# Patient Record
Sex: Female | Born: 1960
Health system: Southern US, Community
[De-identification: ages and names within clinical notes are randomized; demographics above are authoritative.]

## PROBLEM LIST (undated history)

## (undated) DIAGNOSIS — K5900 Constipation, unspecified: Secondary | ICD-10-CM

## (undated) DIAGNOSIS — D509 Iron deficiency anemia, unspecified: Secondary | ICD-10-CM

## (undated) DIAGNOSIS — G4733 Obstructive sleep apnea (adult) (pediatric): Secondary | ICD-10-CM

## (undated) DIAGNOSIS — I10 Essential (primary) hypertension: Secondary | ICD-10-CM

## (undated) DIAGNOSIS — K829 Disease of gallbladder, unspecified: Secondary | ICD-10-CM

## (undated) DIAGNOSIS — G4730 Sleep apnea, unspecified: Secondary | ICD-10-CM

## (undated) DIAGNOSIS — F329 Major depressive disorder, single episode, unspecified: Secondary | ICD-10-CM

## (undated) DIAGNOSIS — R110 Nausea: Secondary | ICD-10-CM

## (undated) DIAGNOSIS — M1990 Unspecified osteoarthritis, unspecified site: Secondary | ICD-10-CM

## (undated) DIAGNOSIS — Z8489 Family history of other specified conditions: Secondary | ICD-10-CM

## (undated) DIAGNOSIS — K589 Irritable bowel syndrome without diarrhea: Secondary | ICD-10-CM

## (undated) DIAGNOSIS — Z9989 Dependence on other enabling machines and devices: Secondary | ICD-10-CM

## (undated) DIAGNOSIS — J309 Allergic rhinitis, unspecified: Secondary | ICD-10-CM

## (undated) DIAGNOSIS — H6990 Unspecified Eustachian tube disorder, unspecified ear: Secondary | ICD-10-CM

## (undated) DIAGNOSIS — R079 Chest pain, unspecified: Secondary | ICD-10-CM

## (undated) DIAGNOSIS — E785 Hyperlipidemia, unspecified: Secondary | ICD-10-CM

## (undated) DIAGNOSIS — E669 Obesity, unspecified: Secondary | ICD-10-CM

## (undated) DIAGNOSIS — K602 Anal fissure, unspecified: Secondary | ICD-10-CM

## (undated) DIAGNOSIS — K649 Unspecified hemorrhoids: Secondary | ICD-10-CM

## (undated) DIAGNOSIS — M2550 Pain in unspecified joint: Secondary | ICD-10-CM

## (undated) DIAGNOSIS — R519 Headache, unspecified: Secondary | ICD-10-CM

## (undated) DIAGNOSIS — M797 Fibromyalgia: Secondary | ICD-10-CM

## (undated) DIAGNOSIS — G4700 Insomnia, unspecified: Secondary | ICD-10-CM

## (undated) DIAGNOSIS — K219 Gastro-esophageal reflux disease without esophagitis: Secondary | ICD-10-CM

## (undated) DIAGNOSIS — S0300XA Dislocation of jaw, unspecified side, initial encounter: Secondary | ICD-10-CM

## (undated) DIAGNOSIS — H6980 Other specified disorders of Eustachian tube, unspecified ear: Secondary | ICD-10-CM

## (undated) DIAGNOSIS — D649 Anemia, unspecified: Secondary | ICD-10-CM

## (undated) DIAGNOSIS — IMO0002 Reserved for concepts with insufficient information to code with codable children: Secondary | ICD-10-CM

## (undated) DIAGNOSIS — M549 Dorsalgia, unspecified: Secondary | ICD-10-CM

## (undated) DIAGNOSIS — G5600 Carpal tunnel syndrome, unspecified upper limb: Secondary | ICD-10-CM

## (undated) DIAGNOSIS — F32A Depression, unspecified: Secondary | ICD-10-CM

## (undated) DIAGNOSIS — K760 Fatty (change of) liver, not elsewhere classified: Secondary | ICD-10-CM

## (undated) DIAGNOSIS — J45909 Unspecified asthma, uncomplicated: Secondary | ICD-10-CM

## (undated) DIAGNOSIS — Z8669 Personal history of other diseases of the nervous system and sense organs: Secondary | ICD-10-CM

## (undated) DIAGNOSIS — N816 Rectocele: Secondary | ICD-10-CM

## (undated) HISTORY — DX: Unspecified asthma, uncomplicated: J45.909

## (undated) HISTORY — DX: Carpal tunnel syndrome, unspecified upper limb: G56.00

## (undated) HISTORY — DX: Major depressive disorder, single episode, unspecified: F32.9

## (undated) HISTORY — DX: Dislocation of jaw, unspecified side, initial encounter: S03.00XA

## (undated) HISTORY — DX: Unspecified osteoarthritis, unspecified site: M19.90

## (undated) HISTORY — DX: Essential (primary) hypertension: I10

## (undated) HISTORY — DX: Pain in unspecified joint: M25.50

## (undated) HISTORY — DX: Anal fissure, unspecified: K60.2

## (undated) HISTORY — DX: Fatty (change of) liver, not elsewhere classified: K76.0

## (undated) HISTORY — DX: Insomnia, unspecified: G47.00

## (undated) HISTORY — DX: Other specified disorders of Eustachian tube, unspecified ear: H69.80

## (undated) HISTORY — DX: Allergic rhinitis, unspecified: J30.9

## (undated) HISTORY — DX: Dorsalgia, unspecified: M54.9

## (undated) HISTORY — DX: Obesity, unspecified: E66.9

## (undated) HISTORY — DX: Depression, unspecified: F32.A

## (undated) HISTORY — DX: Irritable bowel syndrome, unspecified: K58.9

## (undated) HISTORY — DX: Nausea: R11.0

## (undated) HISTORY — DX: Hyperlipidemia, unspecified: E78.5

## (undated) HISTORY — DX: Unspecified eustachian tube disorder, unspecified ear: H69.90

## (undated) HISTORY — DX: Unspecified hemorrhoids: K64.9

## (undated) HISTORY — PX: CHOLECYSTECTOMY: SHX55

## (undated) HISTORY — DX: Sleep apnea, unspecified: G47.30

## (undated) HISTORY — PX: DILATION AND CURETTAGE OF UTERUS: SHX78

## (undated) HISTORY — DX: Disease of gallbladder, unspecified: K82.9

## (undated) HISTORY — DX: Chest pain, unspecified: R07.9

## (undated) HISTORY — DX: Dependence on other enabling machines and devices: Z99.89

## (undated) HISTORY — DX: Iron deficiency anemia, unspecified: D50.9

## (undated) HISTORY — DX: Gastro-esophageal reflux disease without esophagitis: K21.9

## (undated) HISTORY — DX: Reserved for concepts with insufficient information to code with codable children: IMO0002

## (undated) HISTORY — DX: Constipation, unspecified: K59.00

## (undated) HISTORY — DX: Personal history of other diseases of the nervous system and sense organs: Z86.69

## (undated) HISTORY — DX: Obstructive sleep apnea (adult) (pediatric): G47.33

## (undated) HISTORY — DX: Anemia, unspecified: D64.9

## (undated) HISTORY — DX: Rectocele: N81.6

---

## 1986-11-11 HISTORY — PX: TUBAL LIGATION: SHX77

## 1987-11-12 HISTORY — PX: CARPAL TUNNEL RELEASE: SHX101

## 1991-11-12 HISTORY — PX: ECTOPIC PREGNANCY SURGERY: SHX613

## 1999-04-17 ENCOUNTER — Encounter: Admission: RE | Admit: 1999-04-17 | Discharge: 1999-04-17 | Payer: Self-pay | Admitting: Sports Medicine

## 1999-04-17 ENCOUNTER — Other Ambulatory Visit: Admission: RE | Admit: 1999-04-17 | Discharge: 1999-04-17 | Payer: Self-pay | Admitting: *Deleted

## 1999-05-24 ENCOUNTER — Encounter: Admission: RE | Admit: 1999-05-24 | Discharge: 1999-05-24 | Payer: Self-pay | Admitting: Family Medicine

## 1999-09-30 ENCOUNTER — Emergency Department (HOSPITAL_COMMUNITY): Admission: EM | Admit: 1999-09-30 | Discharge: 1999-09-30 | Payer: Self-pay | Admitting: Emergency Medicine

## 1999-11-07 ENCOUNTER — Ambulatory Visit (HOSPITAL_COMMUNITY): Admission: RE | Admit: 1999-11-07 | Discharge: 1999-11-07 | Payer: Self-pay | Admitting: Orthopedic Surgery

## 1999-11-07 ENCOUNTER — Encounter: Payer: Self-pay | Admitting: Orthopedic Surgery

## 1999-11-13 ENCOUNTER — Encounter: Admission: RE | Admit: 1999-11-13 | Discharge: 1999-11-13 | Payer: Self-pay | Admitting: Family Medicine

## 1999-12-04 ENCOUNTER — Encounter: Admission: RE | Admit: 1999-12-04 | Discharge: 1999-12-04 | Payer: Self-pay | Admitting: Sports Medicine

## 2000-01-10 ENCOUNTER — Encounter: Admission: RE | Admit: 2000-01-10 | Discharge: 2000-01-10 | Payer: Self-pay | Admitting: Family Medicine

## 2000-01-21 ENCOUNTER — Emergency Department (HOSPITAL_COMMUNITY): Admission: EM | Admit: 2000-01-21 | Discharge: 2000-01-21 | Payer: Self-pay | Admitting: Emergency Medicine

## 2000-02-13 ENCOUNTER — Encounter: Admission: RE | Admit: 2000-02-13 | Discharge: 2000-02-13 | Payer: Self-pay | Admitting: Family Medicine

## 2000-03-10 ENCOUNTER — Encounter: Admission: RE | Admit: 2000-03-10 | Discharge: 2000-03-10 | Payer: Self-pay | Admitting: Family Medicine

## 2000-03-10 ENCOUNTER — Other Ambulatory Visit: Admission: RE | Admit: 2000-03-10 | Discharge: 2000-03-31 | Payer: Self-pay | Admitting: *Deleted

## 2000-04-03 ENCOUNTER — Encounter: Admission: RE | Admit: 2000-04-03 | Discharge: 2000-04-03 | Payer: Self-pay | Admitting: Family Medicine

## 2000-10-15 ENCOUNTER — Encounter: Admission: RE | Admit: 2000-10-15 | Discharge: 2000-10-15 | Payer: Self-pay | Admitting: Family Medicine

## 2001-02-26 ENCOUNTER — Encounter: Admission: RE | Admit: 2001-02-26 | Discharge: 2001-02-26 | Payer: Self-pay | Admitting: Family Medicine

## 2001-03-23 ENCOUNTER — Encounter: Admission: RE | Admit: 2001-03-23 | Discharge: 2001-03-23 | Payer: Self-pay | Admitting: Family Medicine

## 2001-03-31 ENCOUNTER — Encounter: Admission: RE | Admit: 2001-03-31 | Discharge: 2001-03-31 | Payer: Self-pay | Admitting: Family Medicine

## 2001-05-06 ENCOUNTER — Encounter: Admission: RE | Admit: 2001-05-06 | Discharge: 2001-05-06 | Payer: Self-pay | Admitting: Family Medicine

## 2001-07-29 ENCOUNTER — Encounter: Admission: RE | Admit: 2001-07-29 | Discharge: 2001-07-29 | Payer: Self-pay | Admitting: Family Medicine

## 2001-09-11 ENCOUNTER — Encounter: Admission: RE | Admit: 2001-09-11 | Discharge: 2001-09-11 | Payer: Self-pay | Admitting: Family Medicine

## 2001-10-14 ENCOUNTER — Emergency Department (HOSPITAL_COMMUNITY): Admission: EM | Admit: 2001-10-14 | Discharge: 2001-10-14 | Payer: Self-pay | Admitting: Emergency Medicine

## 2002-01-08 ENCOUNTER — Encounter: Admission: RE | Admit: 2002-01-08 | Discharge: 2002-01-08 | Payer: Self-pay | Admitting: Family Medicine

## 2002-02-05 ENCOUNTER — Encounter: Admission: RE | Admit: 2002-02-05 | Discharge: 2002-02-05 | Payer: Self-pay | Admitting: Sports Medicine

## 2002-04-21 ENCOUNTER — Encounter: Admission: RE | Admit: 2002-04-21 | Discharge: 2002-04-21 | Payer: Self-pay | Admitting: Family Medicine

## 2002-07-23 ENCOUNTER — Encounter: Admission: RE | Admit: 2002-07-23 | Discharge: 2002-07-23 | Payer: Self-pay | Admitting: Family Medicine

## 2002-07-26 ENCOUNTER — Encounter: Admission: RE | Admit: 2002-07-26 | Discharge: 2002-07-26 | Payer: Self-pay | Admitting: Family Medicine

## 2002-12-02 ENCOUNTER — Encounter: Admission: RE | Admit: 2002-12-02 | Discharge: 2002-12-02 | Payer: Self-pay | Admitting: Family Medicine

## 2003-03-04 ENCOUNTER — Encounter: Admission: RE | Admit: 2003-03-04 | Discharge: 2003-03-04 | Payer: Self-pay | Admitting: Family Medicine

## 2003-03-10 ENCOUNTER — Encounter: Admission: RE | Admit: 2003-03-10 | Discharge: 2003-03-10 | Payer: Self-pay | Admitting: Family Medicine

## 2003-03-24 ENCOUNTER — Encounter: Admission: RE | Admit: 2003-03-24 | Discharge: 2003-03-24 | Payer: Self-pay | Admitting: Sports Medicine

## 2003-03-24 ENCOUNTER — Encounter: Payer: Self-pay | Admitting: Sports Medicine

## 2003-03-24 ENCOUNTER — Encounter: Admission: RE | Admit: 2003-03-24 | Discharge: 2003-03-24 | Payer: Self-pay | Admitting: Family Medicine

## 2003-03-28 ENCOUNTER — Encounter: Admission: RE | Admit: 2003-03-28 | Discharge: 2003-03-28 | Payer: Self-pay | Admitting: Sports Medicine

## 2003-03-28 ENCOUNTER — Encounter: Payer: Self-pay | Admitting: Sports Medicine

## 2003-09-05 ENCOUNTER — Encounter: Admission: RE | Admit: 2003-09-05 | Discharge: 2003-09-05 | Payer: Self-pay | Admitting: Family Medicine

## 2003-12-02 ENCOUNTER — Encounter: Admission: RE | Admit: 2003-12-02 | Discharge: 2003-12-02 | Payer: Self-pay | Admitting: Family Medicine

## 2003-12-25 ENCOUNTER — Emergency Department (HOSPITAL_COMMUNITY): Admission: EM | Admit: 2003-12-25 | Discharge: 2003-12-26 | Payer: Self-pay | Admitting: Emergency Medicine

## 2003-12-27 ENCOUNTER — Encounter: Admission: RE | Admit: 2003-12-27 | Discharge: 2003-12-27 | Payer: Self-pay | Admitting: Sports Medicine

## 2004-02-06 ENCOUNTER — Encounter: Admission: RE | Admit: 2004-02-06 | Discharge: 2004-02-06 | Payer: Self-pay | Admitting: Family Medicine

## 2004-02-10 ENCOUNTER — Encounter (INDEPENDENT_AMBULATORY_CARE_PROVIDER_SITE_OTHER): Payer: Self-pay | Admitting: *Deleted

## 2004-02-10 LAB — CONVERTED CEMR LAB

## 2004-03-09 ENCOUNTER — Encounter: Admission: RE | Admit: 2004-03-09 | Discharge: 2004-03-09 | Payer: Self-pay | Admitting: Sports Medicine

## 2004-03-09 ENCOUNTER — Encounter (INDEPENDENT_AMBULATORY_CARE_PROVIDER_SITE_OTHER): Payer: Self-pay | Admitting: *Deleted

## 2004-03-15 ENCOUNTER — Encounter: Admission: RE | Admit: 2004-03-15 | Discharge: 2004-03-15 | Payer: Self-pay | Admitting: Family Medicine

## 2005-04-19 ENCOUNTER — Encounter: Admission: RE | Admit: 2005-04-19 | Discharge: 2005-04-19 | Payer: Self-pay | Admitting: Family Medicine

## 2006-06-26 ENCOUNTER — Encounter: Admission: RE | Admit: 2006-06-26 | Discharge: 2006-06-26 | Payer: Self-pay | Admitting: Family Medicine

## 2006-11-11 LAB — HM COLONOSCOPY

## 2007-01-08 DIAGNOSIS — E739 Lactose intolerance, unspecified: Secondary | ICD-10-CM | POA: Insufficient documentation

## 2007-01-08 DIAGNOSIS — K5900 Constipation, unspecified: Secondary | ICD-10-CM | POA: Insufficient documentation

## 2007-01-08 DIAGNOSIS — D259 Leiomyoma of uterus, unspecified: Secondary | ICD-10-CM | POA: Insufficient documentation

## 2007-01-09 ENCOUNTER — Encounter (INDEPENDENT_AMBULATORY_CARE_PROVIDER_SITE_OTHER): Payer: Self-pay | Admitting: *Deleted

## 2007-02-25 ENCOUNTER — Emergency Department (HOSPITAL_COMMUNITY): Admission: EM | Admit: 2007-02-25 | Discharge: 2007-02-25 | Payer: Self-pay | Admitting: Family Medicine

## 2007-04-20 ENCOUNTER — Ambulatory Visit: Payer: Self-pay | Admitting: Internal Medicine

## 2007-05-04 ENCOUNTER — Encounter (INDEPENDENT_AMBULATORY_CARE_PROVIDER_SITE_OTHER): Payer: Self-pay | Admitting: *Deleted

## 2007-05-04 ENCOUNTER — Ambulatory Visit: Payer: Self-pay | Admitting: Internal Medicine

## 2007-07-30 ENCOUNTER — Encounter: Admission: RE | Admit: 2007-07-30 | Discharge: 2007-07-30 | Payer: Self-pay

## 2008-01-11 ENCOUNTER — Emergency Department (HOSPITAL_COMMUNITY): Admission: EM | Admit: 2008-01-11 | Discharge: 2008-01-11 | Payer: Self-pay | Admitting: Family Medicine

## 2008-07-28 ENCOUNTER — Emergency Department (HOSPITAL_COMMUNITY): Admission: EM | Admit: 2008-07-28 | Discharge: 2008-07-28 | Payer: Self-pay | Admitting: Emergency Medicine

## 2008-08-02 ENCOUNTER — Encounter: Admission: RE | Admit: 2008-08-02 | Discharge: 2008-08-02 | Payer: Self-pay | Admitting: Family Medicine

## 2008-09-01 ENCOUNTER — Ambulatory Visit: Payer: Self-pay | Admitting: Family Medicine

## 2008-11-29 ENCOUNTER — Encounter: Admission: RE | Admit: 2008-11-29 | Discharge: 2009-01-31 | Payer: Self-pay | Admitting: Family Medicine

## 2008-12-20 ENCOUNTER — Ambulatory Visit (HOSPITAL_COMMUNITY): Admission: RE | Admit: 2008-12-20 | Discharge: 2008-12-20 | Payer: Self-pay | Admitting: Neurology

## 2009-09-05 ENCOUNTER — Encounter: Admission: RE | Admit: 2009-09-05 | Discharge: 2009-09-05 | Payer: Self-pay | Admitting: Family Medicine

## 2010-05-29 ENCOUNTER — Emergency Department (HOSPITAL_COMMUNITY): Admission: EM | Admit: 2010-05-29 | Discharge: 2010-05-29 | Payer: Self-pay | Admitting: Emergency Medicine

## 2010-06-07 ENCOUNTER — Ambulatory Visit (HOSPITAL_COMMUNITY): Admission: RE | Admit: 2010-06-07 | Discharge: 2010-06-08 | Payer: Self-pay | Admitting: Obstetrics and Gynecology

## 2010-06-07 ENCOUNTER — Encounter (INDEPENDENT_AMBULATORY_CARE_PROVIDER_SITE_OTHER): Payer: Self-pay | Admitting: Obstetrics and Gynecology

## 2010-06-07 ENCOUNTER — Encounter (INDEPENDENT_AMBULATORY_CARE_PROVIDER_SITE_OTHER): Payer: Self-pay | Admitting: *Deleted

## 2010-06-08 ENCOUNTER — Encounter (INDEPENDENT_AMBULATORY_CARE_PROVIDER_SITE_OTHER): Payer: Self-pay | Admitting: *Deleted

## 2010-09-26 ENCOUNTER — Encounter: Admission: RE | Admit: 2010-09-26 | Discharge: 2010-09-26 | Payer: Self-pay | Admitting: Family Medicine

## 2010-11-11 HISTORY — PX: ABDOMINAL HYSTERECTOMY: SHX81

## 2010-11-19 ENCOUNTER — Encounter (INDEPENDENT_AMBULATORY_CARE_PROVIDER_SITE_OTHER): Payer: Self-pay | Admitting: *Deleted

## 2010-11-21 ENCOUNTER — Encounter
Admission: RE | Admit: 2010-11-21 | Discharge: 2010-12-11 | Payer: Self-pay | Source: Home / Self Care | Attending: Family Medicine | Admitting: Family Medicine

## 2010-12-02 ENCOUNTER — Encounter: Payer: Self-pay | Admitting: Family Medicine

## 2010-12-13 NOTE — Letter (Signed)
Summary: New Patient letter  [facility]  [address]   [place], [place] [address]   Phone: [phone]  Fax: [phone]       11/19/2010 MRN: [id number]  Cristina [name] [address] [place], [place]  [address]  Dear Cristina [name],  Welcome to the Gastroenterology Division at [facility].    You are scheduled to see Dr.  Marina Goodell on 12/31/2010 at 10:00 on the 3rd floor at [facility], [address]  We ask that you try to arrive at our office 15 minutes prior to your appointment time to allow for check-in.  We would like you to complete the enclosed self-administered evaluation form prior to your visit and bring it with you on the day of your appointment.  We will review it with you.  Also, please bring a complete list of all your medications or, if you prefer, bring the medication bottles and we will list them.  Please bring your insurance card so that we may make a copy of it.  If your insurance requires a referral to see a specialist, please bring your referral form from your primary care physician.  Co-payments are due at the time of your visit and may be paid by cash, check or credit card.     Your office visit will consist of a consult with your physician (includes a physical exam), any laboratory testing he/she may order, scheduling of any necessary diagnostic testing (e.g. x-ray, ultrasound, CT-scan), and scheduling of a procedure (e.g. Endoscopy, Colonoscopy) if required.  Please allow enough time on your schedule to allow for any/all of these possibilities.    If you cannot keep your appointment, please call [phone] to cancel or reschedule prior to your appointment date.  This allows [name] the opportunity to schedule an appointment for another patient in need of care.  If you do not cancel or reschedule by 5 p.m. the business day prior to your appointment date, you will be charged a $50.00 late cancellation/no-show fee.    Thank you for choosing  [facility] for your medical needs.  We appreciate the opportunity to care for you.  Please visit [name] at our website  to learn more about our practice.                     Sincerely,                                                             The Gastroenterology Division

## 2010-12-18 ENCOUNTER — Ambulatory Visit: Payer: Self-pay | Admitting: Family Medicine

## 2010-12-31 ENCOUNTER — Ambulatory Visit (INDEPENDENT_AMBULATORY_CARE_PROVIDER_SITE_OTHER): Payer: Commercial Managed Care - PPO | Admitting: Internal Medicine

## 2010-12-31 ENCOUNTER — Encounter: Payer: Self-pay | Admitting: Internal Medicine

## 2010-12-31 DIAGNOSIS — K6289 Other specified diseases of anus and rectum: Secondary | ICD-10-CM | POA: Insufficient documentation

## 2010-12-31 DIAGNOSIS — E119 Type 2 diabetes mellitus without complications: Secondary | ICD-10-CM | POA: Insufficient documentation

## 2010-12-31 DIAGNOSIS — K602 Anal fissure, unspecified: Secondary | ICD-10-CM | POA: Insufficient documentation

## 2010-12-31 DIAGNOSIS — R1310 Dysphagia, unspecified: Secondary | ICD-10-CM | POA: Insufficient documentation

## 2011-01-02 NOTE — Op Note (Signed)
Summary: Operative Report      NAME:  Cristina [name], Cristina [name]                 ACCOUNT NO.:  [id number]      MEDICAL RECORD NO.:  [id number]          PATIENT TYPE:  OIB      LOCATION:  9311                          FACILITY:  [facility]      PHYSICIAN:  Huel Cote, M.D. DATE OF BIRTH:  [birth year]      DATE OF PROCEDURE:  06/07/2010   DATE OF DISCHARGE:                                  OPERATIVE REPORT      PREOPERATIVE DIAGNOSES:   1. Menorrhagia.   2. Fibroids.   3. Cystocele.   4. Rectocele.      POSTOPERATIVE DIAGNOSES:   1. Menorrhagia.   2. Fibroids.   3. Cystocele.   4. Rectocele.      PROCEDURE:  Laparoscopic-assisted vaginal hysterectomy and anterior-   posterior repair.      SURGEON:  Huel Cote, MD      ASSISTANT:  Zenaida Niece, MD      ANESTHESIA:  General.      FINDINGS:  The uterus is 10-12 weeks in size and fibroids.  Ovaries and   tubes were normal.  There were some mesenteric adhesions on the upper   abdominal wall.  All other anatomy appeared normal.      SPECIMENS:  Uterus and cervix were sent to Pathology.      ESTIMATED BLOOD LOSS:  250 mL.      URINE OUTPUT:  150 mL clear urine.      IV FLUIDS:  2200 mL LR.      There were no known complications.      PROCEDURE IN DETAIL:  The patient was taken to the operating room where   general anesthesia was obtained without difficulty.  She was then   prepped and draped in the normal sterile fashion in the dorsal lithotomy   position.  A speculum was placed in the vagina and a Hulka tenaculum   placed within the uterus for uterine manipulation.  The Foley catheter   was also placed and attention was then turned to the abdomen.   Infraumbilical incision was made through a preexisting scar with the   scalpel and the Veress needle introduced into this area.  It was noted   that the peritoneal cavity was quite deep and once the Veress needle was   introduced, the peritoneal placement was confirmed by  aspiration   injection with normal saline.  The pneumoperitoneum was then obtained   with approximately 3 liters of CO2 gas.  The pressures were noted to be   around 7 with filling.  Once this was obtained, the Veress needle was   removed and the Optiview trocar utilized, 5-mm in size and the   peritoneal cavity was entered under direct visualization.  The   preperitoneal fat was somewhat thick, but the trocar was able to be   introduced into the peritoneal cavity, however, it was difficult to seat   it very deeply into the cavity.  The pelvis and abdomen were inspected   with findings as  previously stated.  Two additional trocars were placed   under direct visualization in the right and left upper quadrant, 5-mm in   size, each trocar site was injected with 0.25% Marcaine prior to   insertion.  Once the three trocar sites were in place, the uterus was   grasped and the cul-de-sac appeared clear.  All appeared normal as far   as the pelvic anatomy.  Therefore, the atraumatic grasper was utilized   to grasp the cornua and the patient's right utero-ovarian ligament,   broad ligament, and round ligament were taken down sequentially with the   harmonic scalpel.  The bladder flap was developed with the harmonic   scalpel as well.  Once this side was completed in a similar fashion, the   cornua was reflected medially from the left and the left utero-ovarian   broad round ligament were taken down with the harmonic scalpel.  The   bladder flap was also developed on this side and a good dissection   performed.  There was no active bleeding noted at this point.  So, all   instruments and sponges were removed from the abdomen.  The camera and   light were turned off and the patient was repositioned with the legs up.   Attention was then turned vaginally where the Hulka tenaculum was   removed.  A weighted speculum was placed within the vagina and the   cervix grasped with 2 Jacobson tenaculums.  The  circumferential mucosa   around the cervix was injected with a dilute solution of Pitressin and   the Bovie cautery utilized to make a circumferential incision and the   mucosa overlying the cervix.  This was then additionally trimmed away   with Mayo scissors and the cul-de-sac posteriorly was entered sharply.   Once posterior cul-de-sac was entered, the banana speculum was placed   within the cul-de-sac and the uterosacral ligaments were then clamped   with a parametrial clamp transected and suture ligated with 2-0 Vicryl.   The anterior cul-de-sac was then also entered sharply and the Sims   retractor placed within that, thus with the uterus isolated from the   bladder and the rectum.  The remainder of the paracervical tissue was   taken down sequentially with Zeppelin clamps. At each stage, it was   transected and suture ligated with 0 Vicryl.  This was carried up to the   level of the upper cervix and at this point, attempts were made to flip   the uterus, although there was not much intervening tissue.  The uterus   could not be adequately flipped, therefore it was partially cord and   once this was performed, the uterus did move for enough to be flipped.   It was no longer attached on the right and on the left, there was a   small amount of tissue which was clamped with a Zeppelin clamp and the   entire uterus then amputated and handed off to Pathology.  There was an   area of bleeding around the uterine artery on the left and this was   secured with a suture ligature with no active bleeding noted.  The   sponge stick was then placed within the vaginal cuff and all areas were   carefully inspected.  Small amounts of bleeding on posterior cuff were   secured with a running locked suture of 2-0 Vicryl.  There was no other   significant active bleeding noted.  Therefore,  the peritoneum was closed   with 2-0 Vicryl and uterosacral ligaments reapproximated to one another   as well with 0  Vicryl.  Once this was performed, the anterior portion of   the cuff was grasped and the mucosa at the anterior cuff was undermined   with the Metzenbaum scissors along the midline.  This was dissected away   from the underlying pubovesical fascia up to approximately 2 cm below   the urethral meatus.  Once this was dissected, the mucosal flaps were   retracted laterally and the pubovesical fascia trimmed away and   dissected medially.  Once the dissection had been performed, the   pubovesical fascia was then reapproximated with several interrupted   sutures of 2-0 Vicryl in an interrupted fashion.  These were secured and   the overlying mucosa that was excess was trimmed away with Mayo   scissors.  The vaginal mucosa was then closed with 2-0 Vicryl in a   running locked fashion as well as the vaginal cuff was closed in a   running fashion with 2-0 Vicryl.  There was no active bleeding noted   after cuff closure and attention was then turned to the vaginal floor   which was grasped just inside the introitus with Allis clamps.  The   vaginal mucosa was injected with a dilute solution of Pitressin and then   trimmed away just at the introitus.  The vaginal mucosa was then   undermined with Metzenbaum scissors along the midline up to   approximately 1-2 cm below the vaginal cuff.  These were then reflected   laterally and the rectovaginal fascia was dissected off these flaps and   pushed back to the midline.  Thus, the rectocele was dissected away and   the excess mucosa retracted laterally.  The rectocele was then reduced   with several interrupted sutures of 2-0 Vicryl in an interrupted   fashion.  Rectal exam was then performed with an overlying glove and a   good reduction was noted.  The excess vaginal mucosa was then trimmed   away and closed with a running locked suture of 2-0 Vicryl.  All   appeared hemostatic.  The vagina then was packed with Estrace-coated   gauze and this portion  completed.  Gloves were then changed and   attention returned to the patient's abdomen where again pneumoperitoneum   was obtained.  The ports had slid out somewhat due to the patient's   abdominal wall size and these were gradually reintroduced without   difficulty under direct visualization.  Once these were in place again,   the abdomen and pelvis were carefully inspected.  There were no areas of   heavy active bleeding.  There were small areas of oozing along the   pelvic sidewall, the left and around the vaginal cuff.  These were   cauterized with harmonic scalpel and no significant active bleeding   noted at this point.  The ureters were not clearly visible due to the   patient's size, but appeared to be well away from the area of the   vaginal cuff closure.  At this point, all irrigant was removed.   Everything appeared hemostatic and the trocars were removed under direct   visualization and the pneumoperitoneum reduced.  Once this was completed   and the trocars were out, the skin sutures were closed.  There was 1   deep suture placed of 0 Vicryl at the umbilical port and these  had   particular stitch performed at the skin of all port sites of a 3-0   Vicryl.  Dermabond was used in addition.  Again sponge, lap, and needle   counts were correct x2 and the patient was taken to the recovery room in   good condition.               Huel Cote, M.D.            KR/MEDQ  D:  06/07/2010  T:  06/07/2010  Job:  [id number]      Electronically Signed by Huel Cote M.D. on 06/14/2010 09:12:33 AM

## 2011-01-02 NOTE — Discharge Summary (Signed)
Summary: Hospital Discharge Summary    NAME:  Cristina [name], Cristina [name]                 ACCOUNT NO.:  [id number]      MEDICAL RECORD NO.:  [id number]          PATIENT TYPE:  OIB      LOCATION:  9311                          FACILITY:  [facility]      PHYSICIAN:  Huel Cote, M.D. DATE OF BIRTH:  [birth year]      DATE OF ADMISSION:  06/07/2010   DATE OF DISCHARGE:  06/08/2010                                  DISCHARGE SUMMARY      DISCHARGE DIAGNOSES:   1. Menorrhagia.   2. Fibroid uterus.   3. Cystocele.   4. Rectocele.   5. Status post laparoscopic vaginal hysterectomy with anterior-       posterior repair.      DISCHARGE MEDICATIONS:  The patient will resume all of her preop   medications as she was already taking and in addition, has Vicodin 5/500   p.o. every 6 hours as needed for pain.      DISCHARGE FOLLOWUP:  The patient is to follow up in the office in 2-3   weeks for incision check.      HOSPITAL COURSE:  The patient is a 50 year old G4, P3-0-1-3, who came in   for a scheduled laparoscopic-assisted vaginal hysterectomy for   menorrhagia, fibroid uterus, and flooding.  The patient underwent her   surgery without incident and was admitted for routine postoperative   care.  On postop day #1, the patient was doing well.  She has a little   nausea and vomiting immediately following surgery, but this had   completely resolved and she was tolerating regular diet.  Her pain was   well controlled.  She had minimal vaginal bleeding and a vaginal packing   was removed.  She was able to void and ambulate without difficulty.  On   discharge, she was afebrile with stable vital signs.  Discharge   hemoglobin was 10.1.  She did have her blood sugars monitored which were   acceptable.  Her fasting blood sugar was little high on the day of   discharge of 120, but her postprandial the day before was 143.  She had   good urine output and her incisions were clear and abdomen was soft.   Vaginal packing  was removed prior to discharge and the discharge plan   was made for her to follow up in the office in 2-3 weeks.  She was given   her pain prescriptions and was instructed on pelvic rest.  She will also   continue to check her Accu-Chek blood sugars at least 2-3 times a week   and ensure that they are not rising any as they were previously very   well controlled.               Huel Cote, M.D.            KR/MEDQ  D:  06/08/2010  T:  06/08/2010  Job:  [id number]      Electronically Signed by Huel Cote M.D. on 06/14/2010 09:12:34 AM

## 2011-01-02 NOTE — Procedures (Signed)
Summary: Colonoscopy   Colonoscopy  Procedure date:  05/04/2007  Findings:      [facility]:   [facility].  Results: Normal.  Patient Name: Meeah, Totino MRN:  Procedure Procedures: Colonoscopy CPT: 81191.  Personnel: Endoscopist: Wilhemina Bonito. Marina Goodell, MD.  Referred By: Nilda Simmer, MD.  Exam Location: Exam performed in Outpatient Clinic. Outpatient  Patient Consent: Procedure, Alternatives, Risks and Benefits discussed, consent obtained, from patient. Consent was obtained by the RN.  Indications  Increased Risk Screening: For family history of colorectal neoplasia, in  parent age at onset: 62. Uncle , aunt  History  Current Medications: Patient is not currently taking Coumadin.  Pre-Exam Physical: Performed May 04, 2007. Cardio-pulmonary exam, Rectal exam, HEENT exam , Abdominal exam, Mental status exam WNL.  Comments: Pt. history reviewed/updated, physical exam performed prior to initiation of sedation?yes Exam Exam: Extent of exam reached: Cecum, extent intended: Cecum.  The cecum was identified by appendiceal orifice and IC valve. Patient position: on left side. Time to Cecum: 00:03:14. Time for Withdrawl: 00:08:45. Colon retroflexion performed. Images taken. ASA Classification: II. Tolerance: excellent.  Monitoring: Pulse and BP monitoring, Oximetry used. Supplemental O2 given.  Colon Prep Used Miralax for colon prep. Prep results: excellent.  Sedation Meds: Patient assessed and found to be appropriate for moderate (conscious) sedation. Fentanyl 100 mcg. given IV. Versed 10 mg. given IV.  Findings NORMAL EXAM: Cecum to Rectum.   Assessment Normal examination.  Comments: NO POLYPS SEEN Events  Unplanned Interventions: No intervention was required.  Unplanned Events: There were no complications. Plans Disposition: After procedure patient sent to recovery. After recovery patient sent home.  Scheduling/Referral: Colonoscopy, to Wilhemina Bonito. Marina Goodell,  MD, IN 5 YEARS,    This report was created from the original endoscopy report, which was reviewed and signed by the above listed endoscopist.   cc:  Nilda Simmer, MD      The Patient

## 2011-01-03 ENCOUNTER — Encounter: Payer: Commercial Managed Care - PPO | Attending: Family Medicine

## 2011-01-08 NOTE — Letter (Signed)
Summary: Diabetic Instructions  [facility]  [place], [place] [address]   Phone: [phone]  Fax: [phone]    Cristina [name] 26-Feb-1961 MRN: [id number]    X   ORAL DIABETIC MEDICATION INSTRUCTIONS  The day before your procedure:   Take your diabetic pill as you do normally  The day of your procedure:   Do not take your diabetic pill    We will check your blood sugar levels during the admission process and again in Recovery before discharging you home  ________________________________________________________________________  _  _   INSULIN (LONG ACTING) MEDICATION INSTRUCTIONS (Lantus, NPH, 70/30, Humulin, Novolin-N)   The day before your procedure:   Take  your regular evening dose    The day of your procedure:   Do not take your morning dose    _  _   INSULIN (SHORT ACTING) MEDICATION INSTRUCTIONS (Regular, Humulog, Novolog)   The day before your procedure:   Do not take your evening dose   The day of your procedure:   Do not take your morning dose   _  _   INSULIN PUMP MEDICATION INSTRUCTIONS  We will contact the physician managing your diabetic care for written dosage instructions for the day before your procedure and the day of your procedure.  Once we have received the instructions, we will contact you.

## 2011-01-08 NOTE — Assessment & Plan Note (Addendum)
Summary: DYSPHAGIA AND RECTAL PAIN...    History of Present Illness Visit Type: Initial Visit Primary GI MD: Scarlette Shorts MD Primary Provider: Reginia Forts, MD Chief Complaint: Dysphagia with solids and rectal pain constantly x 2 months History of Present Illness:    50 year old female with obesity, hyperlipidemia, diabetes mellitus, an anal fissure. Last seen in June of 2008 4 screening colonoscopy. First degree relative as well as aunt and uncle with colon cancer. The examination was normal. Followup in 5 years recommended. She presents now with chief complaint of dysphagia. Also complained of rectal pain. First, she reports a history of anal fissure. Currently describes pain and burning with defecation for approximately 2 months. No significant bleeding. No treatment initiated. Next, a one-year history of intermittent solid food dysphagia. No significant weight change. No palpable pain. She had been placed on a PPI without change in symptoms. Currently on Nexium 40 mg daily. Takes Glucophage for diabetes   GI Review of Systems    Reports acid reflux, bloating, and  dysphagia with solids.      Denies abdominal pain, belching, chest pain, dysphagia with liquids, heartburn, loss of appetite, nausea, vomiting, vomiting blood, weight loss, and  weight gain.      Reports diarrhea, hemorrhoids, irritable bowel syndrome, rectal bleeding, and  rectal pain.     Denies anal fissure, black tarry stools, change in bowel habit, constipation, diverticulosis, fecal incontinence, heme positive stool, jaundice, light color stool, and  liver problems.    Current Medications (verified): 1)  Glucophage 500 Mg Tabs (Metformin Hcl) .... 2  By Mouth Two Times A Day 2)  Valtrex 500 Mg Tabs (Valacyclovir Hcl) .[name].. 1 By Mouth Once Daily 3)  Multivitamins  Tabs (Multiple Vitamin) .[name].. 1 By Mouth Once Daily 4)  Vitamin C 500 Mg Tabs (Ascorbic Acid) .[name].. 1 By Mouth Once Daily 5)  Stool Softener 100 Mg Caps (Docusate  Sodium) .... 2 By Mouth Once Daily As Needed 6)  Ambien 10 Mg Tabs (Zolpidem Tartrate) .[name].. 1 By Mouth At Bedtime As Needed Sleep 7)  Tylenol Pm Extra Strength 500-25 Mg Tabs (Diphenhydramine-Apap (Sleep)) .... 2 As Needed Headache 8)  Ibuprofen 600 Mg Tabs (Ibuprofen) .[name].. 1 By Mouth As Needed 9)  Zoloft 25 Mg Tabs (Sertraline Hcl) .... Take 1 Tablet By Mouth Once Daily 10)  Simvastatin 40 Mg Tabs (Simvastatin) .... Take 1 Tablet By Mouth Once Daily  Allergies (verified): 1)  ! 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 Carpal Tunnel Release Hysterectomy(Partial)  Family History: Reviewed history from 01/08/2007 and no changes required. 4 brothers- good health, in [place] and [place], Aunt-colon CA in 04-Feb-2023, colostomy, F dead at 52 GSW, M alive 61, HTN, OA, No breast cancer in the family, PGM-colon CA, Uncle-colon CA in 18s  Social History: Reviewed history from 01/08/2007 and no changes required. hometown is [place]; has 3 girls ages 25/23/16; 1  lives in [place], lives with oldest daughter, youngest daughter, grandson; divorced; 2 grandkids 81 & 58month.  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  The patient denies allergy/sinus, anemia, anxiety-new, back pain, blood in urine, breast changes/lumps, change in vision, confusion, cough, coughing up blood, fainting, fatigue, fever,  headaches-new, hearing problems, heart murmur, heart rhythm changes, itching, menstrual pain, muscle pains/cramps, night sweats, nosebleeds, pregnancy symptoms, shortness of breath, skin rash, sleeping problems, sore throat, swelling of feet/legs, swollen lymph glands, thirst - excessive , urination - excessive , urination changes/pain, urine leakage, vision changes, and voice change.    Vital Signs:  Patient profile:   50 year old female Height:      61 inches Weight:      216.38 pounds BMI:     41.03 Pulse rate:   60 / minute Pulse rhythm:   regular BP sitting:   120 / 80  (left arm) Cuff size:   regular  Vitals Entered By: June McMurray [place] Deborra Medina) (December 31, 2010 10:00 AM)  Physical Exam  General:  Well developed, well nourished, no acute distress. Head:  Normocephalic and atraumatic. Eyes:  PERRLA, no icterus. Nose:  No deformity, discharge,  or lesions. Mouth:  No deformity or lesions, dentition normal. Neck:  Supple; no masses or thyromegaly. Lungs:  Clear throughout to auscultation. Heart:  Regular rate and rhythm; no murmurs, rubs,  or bruits. Abdomen:  Soft, nontender and nondistended. No masses, hepatosplenomegaly or hernias noted. Normal bowel sounds. Rectal:  ANTERIOR FISSURE - TENDER. HEME NEGATIVE STOOL Msk:  Symmetrical with no gross deformities. Normal posture. Pulses:  Normal pulses noted. Extremities:  No edema or deformities noted. Neurologic:  Alert and  oriented x4 Skin:  Intact without significant lesions or rashes. Psych:  Alert and cooperative. Normal mood and affect.   Impression & Recommendations:  Problem # 1:  ANAL FISSURE (ICD-565.0)  anterior anal fissure with associated rectal pain (569.42 )   #1. sitz baths  twice a day #2. Daily fiber supplementation  #3. Diltiazem 2% cream 5 times daily to the anal canal  #4. Surgical referral if medically refractory  Problem # 2:  DYSPHAGIA UNSPECIFIED (ICD-787.20)  intermittent solid food dysphasia.  rule out stricture   Plan : #1. upper endoscopy with possible esophageal dilation. the nature of the procedure as well as the risks, benefits, and alternatives were reviewed. she understood and agreed to proceed  #2. hold diabetic medications the day of the exam 2 avoid and wanted hypoglycemia  Problem # 3:  DM (ICD-250.00)  hold diabetic medications the day of the examination until resuming oral intake. We will monitor her blood sugar medially before and after the procedure  Other Orders: EGD SAV (EGD SAV)  Patient Instructions: 1)  EGD Dil [facility] 01/10/11 10:30 am arrive at 9:30 am on 4th floor 2)  Upper Endoscopy brochure given.  3)  Upper Endoscopy with Dilatation brochure given.  4)  Hold Diabetic meds morning of procedure. 5)  Sitz bath instructions given to you to read and do daily 6)  Fiber daily. 7)  Diltiazem Cream has been called into [facility] for you to pick up and use 5 times daily to anus.  8)  Copy sent to : Reginia Forts, MD 9)  The medication list was reviewed and reconciled.  All changed / newly prescribed medications were explained.  A complete medication list was provided to the patient / caregiver. Prescriptions: DILTIAZEM CREAM 2% apply to anus 5 times daily  #30 gram x 2   Entered by:   Randye Lobo NCMA   Authorized  by:   Irene Shipper MD   Signed by:   Randye Lobo NCMA on 12/31/2010   Method used:   Telephoned to ...       [facility] (retail)       [address]       [place], [place]  [address]       Ph: [phone]       Fax: [phone]   RxID:   [id number]

## 2011-01-08 NOTE — Letter (Signed)
Summary: EGD Instructions  [facility]  [address] [place], [place] [address]   Phone: [phone]  Fax: [phone]       Laurene Witts    October 18, 1961    MRN: [id number]       Procedure Day /Date:THURSDAY, 01/10/11     Arrival Time:9:30 AM     Procedure Time:10:30 AM     Location of Procedure:                    [facility] (4th Floor)   PREPARATION FOR ENDOSCOPY/DIL   On THURSDAY, 01/10/11 THE DAY OF THE PROCEDURE:  1.   No solid foods, milk or milk products are allowed after midnight the night before your procedure.  2.   Do not drink anything colored red or purple.  Avoid juices with pulp.  No orange juice.  3.  You may drink clear liquids until 8:30 AM  which is 2 hours before your procedure.                                                                                                CLEAR LIQUIDS INCLUDE: Water Jello Ice Popsicles Tea (sugar ok, no milk/cream) Powdered fruit flavored drinks Coffee (sugar ok, no milk/cream) Gatorade Juice: apple, white grape, white cranberry  Lemonade Clear bullion, consomm, broth Carbonated beverages (any kind) Strained chicken noodle soup Hard Candy   MEDICATION INSTRUCTIONS  Unless otherwise instructed, you should take regular prescription medications with a small sip of water as early as possible the morning of your procedure.  Diabetic patients - see separate instructions.           OTHER INSTRUCTIONS  You will need a responsible adult at least 48 years of age to accompany you and drive you home.   This person must remain in the waiting room during your procedure.  Wear loose fitting clothing that is easily removed.  Leave jewelry and other valuables at home.  However, you may wish to bring a book to read or an iPod/MP3 player to listen to music as you wait for your procedure to start.  Remove all body piercing jewelry and leave at home.  Total time from sign-in until discharge  is approximately 2-3 hours.  You should go home directly after your procedure and rest.  You can resume normal activities the day after your procedure.  The day of your procedure you should not:   Drive   Make legal decisions   Operate machinery   Drink alcohol   Return to work  You will receive specific instructions about eating, activities and medications before you leave.    The above instructions have been reviewed and explained to me by   _______________________    I fully understand and can verbalize these instructions _____________________________ Date _________

## 2011-01-10 ENCOUNTER — Encounter (AMBULATORY_SURGERY_CENTER): Payer: Commercial Managed Care - PPO | Admitting: Internal Medicine

## 2011-01-10 ENCOUNTER — Other Ambulatory Visit: Payer: Self-pay | Admitting: Internal Medicine

## 2011-01-10 ENCOUNTER — Encounter: Payer: Self-pay | Admitting: Internal Medicine

## 2011-01-10 DIAGNOSIS — R1310 Dysphagia, unspecified: Secondary | ICD-10-CM

## 2011-01-10 DIAGNOSIS — K319 Disease of stomach and duodenum, unspecified: Secondary | ICD-10-CM

## 2011-01-10 LAB — GLUCOSE, CAPILLARY: Glucose-Capillary: 85 mg/dL (ref 70–99)

## 2011-01-14 ENCOUNTER — Other Ambulatory Visit: Payer: Self-pay | Admitting: Internal Medicine

## 2011-01-14 ENCOUNTER — Encounter: Payer: Self-pay | Admitting: Internal Medicine

## 2011-01-14 DIAGNOSIS — R1310 Dysphagia, unspecified: Secondary | ICD-10-CM

## 2011-01-17 NOTE — Procedures (Addendum)
Summary: Upper Endoscopy  Patient: [name] Note: All result statuses are Final unless otherwise noted.  Tests: (1) Upper Endoscopy (EGD)   EGD Upper Endoscopy       [facility]     [place], [place]  [address]          ENDOSCOPY PROCEDURE REPORT          PATIENT:  Cristina, [name]  MR#:  [id number]     BIRTHDATE:  [birth year], 49 yrs. old  GENDER:  female          ENDOSCOPIST:  Docia Chuck. Geri Seminole, MD     Referred by:  Office          PROCEDURE DATE:  01/10/2011     PROCEDURE:  EGD with biopsy, 701[address]     ASA CLASS:  Class II     INDICATIONS:  dysphagia          MEDICATIONS:   Fentanyl 100 mcg IV, Versed 10 mg IV     TOPICAL ANESTHETIC:  Exactacain Spray          DESCRIPTION OF PROCEDURE:   After the risks benefits and     alternatives of the procedure were thoroughly explained, informed     consent was obtained.  The LB-GIF-H180 [id number] endoscope was     introduced through the mouth and advanced to the second portion of     the duodenum, without limitations.  The instrument was slowly     withdrawn as the mucosa was fully examined.     <<PROCEDUREIMAGES>>          The esophagus and gastroesophageal junction were completely normal     in appearance. No stricture or inflammation.  Erythema was found     in the antrum. CLO bx taken. Otherwise normal stomach.  The     duodenal bulb was normal in appearance, as was the postbulbar     duodenum.    Retroflexed views revealed no abnormalities.    The     scope was then withdrawn from the patient and the procedure     completed.          COMPLICATIONS:  None          ENDOSCOPIC IMPRESSION:     1) Normal esophagus     2) Erythema in the antrum     3) Otherwise normal stomach     4) Normal duodenum          RECOMMENDATIONS:     1) Rx CLO if positive     2) Chew food well          ______________________________     Docia Chuck. Geri Seminole, MD          CC:  Reginia Forts, MD; The  Patient          n.     eSIGNED:   Docia Chuck. Geri Seminole at 01/10/2011 11:17 AM          [name], [id number]  Note: An exclamation mark (!) indicates a result that was not dispersed into the flowsheet. Document Creation Date: 01/10/2011 11:17 AM _______________________________________________________________________  (1) Order result status: Final Collection or observation date-time: 01/10/2011 11:01 Requested date-time:  Receipt date-time:  Reported date-time:  Referring Physician:   Ordering Physician: Lavena Bullion [phone]) Specimen Source:  Source: Tawanna Cooler Order Number: [id number] Lab site:

## 2011-01-17 NOTE — Miscellaneous (Signed)
Summary: Orders Update - CLO  Clinical Lists Changes  Orders: Added new Test order of TLB-H. Pylori Abs(Helicobacter Pylori) (86677-HELICO) - Signed

## 2011-01-22 NOTE — Miscellaneous (Signed)
Summary: Orders Update clo  Clinical Lists Changes  Orders: Added new Test order of TLB-H Pylori Screen Gastric Biopsy (83013-CLOTEST) - Signed

## 2011-01-22 NOTE — Miscellaneous (Signed)
Summary: Orders Update clot  Clinical Lists Changes  Orders: Added new Test order of TLB-H Pylori Screen Gastric Biopsy (83013-CLOTEST) - Signed

## 2011-01-26 LAB — POCT I-STAT, CHEM 8
Glucose, Bld: 96 mg/dL (ref 70–99)
HCT: 40 % (ref 36.0–46.0)
Hemoglobin: 13.6 g/dL (ref 12.0–15.0)
Sodium: 142 mEq/L (ref 135–145)

## 2011-01-26 LAB — GLUCOSE, CAPILLARY
Glucose-Capillary: 120 mg/dL — ABNORMAL HIGH (ref 70–99)
Glucose-Capillary: 145 mg/dL — ABNORMAL HIGH (ref 70–99)
Glucose-Capillary: 168 mg/dL — ABNORMAL HIGH (ref 70–99)
Glucose-Capillary: 168 mg/dL — ABNORMAL HIGH (ref 70–99)
Glucose-Capillary: 93 mg/dL (ref 70–99)

## 2011-01-26 LAB — COMPREHENSIVE METABOLIC PANEL
ALT: 16 U/L (ref 0–35)
AST: 17 U/L (ref 0–37)
Albumin: 3.7 g/dL (ref 3.5–5.2)
Alkaline Phosphatase: 68 U/L (ref 39–117)
BUN: 9 mg/dL (ref 6–23)
CO2: 27 mEq/L (ref 19–32)
Chloride: 103 mEq/L (ref 96–112)
Creatinine, Ser: 0.47 mg/dL (ref 0.4–1.2)
GFR calc Af Amer: >60 mL/min (ref >60)
GFR calc non Af Amer: >60 mL/min (ref >60)
Glucose, Bld: 83 mg/dL (ref 70–99)
Potassium: 3.9 mEq/L (ref 3.5–5.1)
Total Protein: 6.9 g/dL (ref 6.0–8.3)

## 2011-01-26 LAB — CBC
HCT: 37.3 % (ref 36.0–46.0)
Hemoglobin: 11.9 g/dL — ABNORMAL LOW (ref 12.0–15.0)
Hemoglobin: 10.1 g/dL — ABNORMAL LOW (ref 12.0–15.0)
Hemoglobin: 11.4 g/dL — ABNORMAL LOW (ref 12.0–15.0)
MCH: 25.2 pg — ABNORMAL LOW (ref 26.0–34.0)
MCH: 25.2 pg — ABNORMAL LOW (ref 26.0–34.0)
MCHC: 31.9 g/dL (ref 30.0–36.0)
MCHC: 32.1 g/dL (ref 30.0–36.0)
MCHC: 32.1 g/dL (ref 30.0–36.0)
MCV: 79.1 fL (ref 78.0–100.0)
Platelets: 331 K/uL (ref 150–400)
Platelets: 323 10*3/uL (ref 150–400)
RBC: 4.71 MIL/uL (ref 3.87–5.11)
RBC: 4 MIL/uL (ref 3.87–5.11)
RDW: 17.8 % — ABNORMAL HIGH (ref 11.5–15.5)
RDW: 17.7 % — ABNORMAL HIGH (ref 11.5–15.5)
WBC: 15.5 K/uL — ABNORMAL HIGH (ref 4.0–10.5)
WBC: 10.7 10*3/uL — ABNORMAL HIGH (ref 4.0–10.5)

## 2011-01-26 LAB — DIFFERENTIAL
Basophils Absolute: 0 K/uL (ref 0.0–0.1)
Basophils Relative: 0 % (ref 0–1)
Eosinophils Absolute: 0.3 K/uL (ref 0.0–0.7)
Eosinophils Relative: 2 % (ref 0–5)
Lymphocytes Relative: 44 % (ref 12–46)
Lymphs Abs: 6.8 10*3/uL — ABNORMAL HIGH (ref 0.7–4.0)
Monocytes Absolute: 0.8 10*3/uL (ref 0.1–1.0)
Monocytes Relative: 5 % (ref 3–12)
Neutro Abs: 7.6 10*3/uL (ref 1.7–7.7)
Neutrophils Relative %: 49 % (ref 43–77)

## 2011-01-26 LAB — BASIC METABOLIC PANEL
BUN: 3 mg/dL — ABNORMAL LOW (ref 6–23)
Calcium: 8.6 mg/dL (ref 8.4–10.5)
Chloride: 104 mEq/L (ref 96–112)
GFR calc Af Amer: >60 mL/min (ref >60)

## 2011-01-26 LAB — PATHOLOGIST SMEAR REVIEW

## 2011-01-26 LAB — TYPE AND SCREEN: Antibody Screen: NEGATIVE

## 2011-01-26 LAB — SURGICAL PCR SCREEN: Staphylococcus aureus: POSITIVE — AB

## 2011-01-26 LAB — POCT CARDIAC MARKERS: Myoglobin, poc: 63.5 ng/mL (ref 12–200)

## 2011-03-27 ENCOUNTER — Encounter: Payer: Commercial Managed Care - PPO | Attending: Family Medicine | Admitting: Dietician

## 2011-08-05 LAB — INFLUENZA A AND B ANTIGEN (CONVERTED LAB)

## 2011-08-26 ENCOUNTER — Other Ambulatory Visit: Payer: Self-pay | Admitting: Family Medicine

## 2011-08-26 DIAGNOSIS — Z1231 Encounter for screening mammogram for malignant neoplasm of breast: Secondary | ICD-10-CM

## 2011-09-30 ENCOUNTER — Ambulatory Visit
Admission: RE | Admit: 2011-09-30 | Discharge: 2011-09-30 | Disposition: A | Discharge status: Home / Self Care | Payer: Commercial Managed Care - PPO | Source: Ambulatory Visit | Attending: Family Medicine | Admitting: Family Medicine

## 2011-09-30 DIAGNOSIS — Z1231 Encounter for screening mammogram for malignant neoplasm of breast: Secondary | ICD-10-CM

## 2011-10-12 LAB — HM MAMMOGRAPHY: HM Mammogram: NORMAL

## 2011-12-13 HISTORY — PX: ESOPHAGOGASTRODUODENOSCOPY: SHX1529

## 2012-03-02 ENCOUNTER — Ambulatory Visit: Payer: Commercial Managed Care - PPO | Admitting: Internal Medicine

## 2012-03-31 ENCOUNTER — Ambulatory Visit (HOSPITAL_COMMUNITY)
Admission: RE | Admit: 2012-03-31 | Discharge: 2012-03-31 | Disposition: A | Discharge status: Home / Self Care | Payer: 59 | Source: Ambulatory Visit | Attending: Internal Medicine | Admitting: Internal Medicine

## 2012-03-31 ENCOUNTER — Other Ambulatory Visit (HOSPITAL_COMMUNITY): Payer: 59

## 2012-03-31 ENCOUNTER — Encounter: Payer: Self-pay | Admitting: Internal Medicine

## 2012-03-31 ENCOUNTER — Ambulatory Visit (INDEPENDENT_AMBULATORY_CARE_PROVIDER_SITE_OTHER): Payer: 59 | Admitting: Internal Medicine

## 2012-03-31 VITALS — BP 126/82 | HR 93 | Ht 61.0 in | Wt 222.2 lb

## 2012-03-31 DIAGNOSIS — R142 Eructation: Secondary | ICD-10-CM

## 2012-03-31 DIAGNOSIS — R1011 Right upper quadrant pain: Secondary | ICD-10-CM | POA: Insufficient documentation

## 2012-03-31 DIAGNOSIS — K219 Gastro-esophageal reflux disease without esophagitis: Secondary | ICD-10-CM

## 2012-03-31 DIAGNOSIS — R1012 Left upper quadrant pain: Secondary | ICD-10-CM

## 2012-03-31 DIAGNOSIS — R141 Gas pain: Secondary | ICD-10-CM

## 2012-03-31 DIAGNOSIS — Z800 Family history of malignant neoplasm of digestive organs: Secondary | ICD-10-CM

## 2012-03-31 MED ORDER — PEG-KCL-NACL-NASULF-NA ASC-C 100 G PO SOLR: 1.0000 | Freq: Once | ORAL | Status: DC

## 2012-03-31 NOTE — Progress Notes (Signed)
HISTORY OF PRESENT ILLNESS:  Cristina [name] is a 51 y.o. female with multiple significant medical problems as listed below. She presents today with a chief complaint of left upper quadrant pain. She states that this has been going on for 5-6 months. The discomfort is described as bloating. Symptoms are improved after the passing of flatus or bowel movements. Antacids have not helped. She has had no weight loss. Next, she tells me that she has ongoing heartburn despite once daily Nexium. No significant dysphagia. This has been going on for months. No exacerbating or relieving factors, including antacids. Next, she has had intermittent problems with diarrhea. She blames this on certain foods, such as greasy items. No bleeding. She has had a history of constipation for which she has used stool softeners.. Her last colonoscopy, for the purposes of screening (family history) was in June of 2008. Followup in 5 years recommended. Next, she tells me that she was recently noted to be anemic. She was placed on iron. Her primary provider want her to mention this to me. She denies melena or hematochezia. Her last upper endoscopy was performed in March of 2012. This was normal except for mild antral erythema. Testing for H. Pylori was negative. Finally, increased intestinal gas. Intermittent problem for months. Outside records were slowly started from Dr. Tamala Julian and received. CBC from 03/25/2012 reveals hemoglobin of 12.0 and MCV of 77. Hemoglobin A1c 6.4. Additional review of the electronic medical records finds a hemoglobin from July 2011 to be 10.1 with MCV of 78.6.  REVIEW OF SYSTEMS:  All non-GI ROS negative except for joint aches, arthritis,  Past Medical History  Diagnosis Date  . Hemorrhoid   . Fissure, anal   . CTS (carpal tunnel syndrome)   . Fibroids     uterine  . Cystocele   . Rectocele   . Diabetes mellitus   . Genital herpes   . Arthritis   . Depression   . Hyperlipidemia   . Obesity      Past Surgical History  Procedure Date  . Cholecystectomy   . Carpal tunnel release     lt  . Abdominal hysterectomy     Social History Cristina [name]  reports that she has never smoked. She has never used smokeless tobacco. She reports that she does not drink alcohol or use illicit drugs.  family history includes Colon cancer in her paternal grandmother and unspecified family member; Colon cancer (age of onset:40) in an unspecified family member; Hypertension in her mother; and Osteoarthritis in her mother.  Allergies  Allergen Reactions  . Fish Oil        PHYSICAL EXAMINATION: Vital signs: BP 126/82  Pulse 93  Ht 5' 1"  (1.549 m)  Wt 222 lb 3.2 oz (100.789 kg)  BMI 41.98 kg/m2  SpO2 96%  Constitutional:obese, unhealthy-appearing, no acute distress Psychiatric: alert and oriented x3, cooperative Eyes: extraocular movements intact, anicteric, conjunctiva pink Mouth: oral pharynx moist, no lesions Neck: supple no lymphadenopathy Cardiovascular: heart regular rate and rhythm, no murmur Lungs: clear to auscultation bilaterally Abdomen: soft,obese, nontender, nondistended, no obvious ascites, no peritoneal signs, normal bowel sounds, no organomegaly Rectal:deferred until colonoscopy Extremities: no lower extremity edema bilaterally Skin: no lesions on visible extremities Neuro: No focal deficits. No asterixis.    ASSESSMENT:  #1. GERD. Symptoms despite PPI #2. Left upper quadrant pain. Etiology uncertain. Additional workup required #3. Increased intestinal gas. New problem. #4. Microcytic anemia. Appears to be chronic. Hemoglobin actually improved from 2 years  ago. #5. Family history of colon cancer in first and second-degree relatives. Due for surveillance #6. Multiple significant medical problems. Under the care of Dr. Tamala Julian   PLAN:  #1. Increase Nexium to 40 mg twice a day. 3 weeks of samples have been provided #2. Information on increased intestinal gas,  anti-gas diet provided #3. Trial of probiotic Align one daily for 3 weeks. Samples given #4. Abdominal ultrasound to evaluate pain #5. Surveillance colonoscopy.The nature of the procedure, as well as the risks, benefits, and alternatives were carefully and thoroughly reviewed with the patient. Ample time for discussion and questions allowed. The patient understood, was satisfied, and agreed to proceed.  #6. Movi prep prescribed. The patient instructed on its use #7. General medical care with Dr. Tamala Julian

## 2012-03-31 NOTE — Patient Instructions (Signed)
You have been scheduled for a colonoscopy with propofol. Please follow written instructions given to you at your visit today.  Please pick up your prep kit at the pharmacy within the next 1-3 days.  You have been scheduled for an abdominal ultrasound at [facility] (1st floor of hospital) on 03-31-12 at 2:30. Please arrive 15 minutes prior to your appointment for registration. Make certain not to have anything to eat or drink prior to your appointment. Should you need to reschedule your appointment, please contact radiology at [phone].  We have given you some samples of Nexium to begin taking twice a day for 3 weeks to see if there is improvement;    We have given you samples of Align. This puts good bacteria back into your colon. You should take 1 capsule by mouth once daily. If this works well for you, it can be purchased over the counter.

## 2012-04-02 ENCOUNTER — Telehealth: Payer: Self-pay | Admitting: Internal Medicine

## 2012-04-02 ENCOUNTER — Telehealth: Payer: Self-pay

## 2012-04-02 NOTE — Telephone Encounter (Signed)
rescheduled appointment for colonoscopy; patient agreed

## 2012-04-02 NOTE — Telephone Encounter (Signed)
Message sent to me, did not see where the Pt saw Dr. Hilarie Fredrickson, seems to be a Dr. Henrene Pastor Patient. Routed message to [employer].

## 2012-04-20 ENCOUNTER — Telehealth: Payer: Self-pay

## 2012-04-20 MED ORDER — ESOMEPRAZOLE MAGNESIUM 40 MG PO CPDR: 40.0000 mg | DELAYED_RELEASE_CAPSULE | Freq: Every day | ORAL | Status: DC

## 2012-04-20 NOTE — Telephone Encounter (Signed)
Refilled nexium

## 2012-05-04 ENCOUNTER — Other Ambulatory Visit: Payer: Self-pay | Admitting: Occupational Medicine

## 2012-05-04 ENCOUNTER — Ambulatory Visit: Payer: Self-pay

## 2012-05-04 DIAGNOSIS — M79646 Pain in unspecified finger(s): Secondary | ICD-10-CM

## 2012-05-21 ENCOUNTER — Encounter: Payer: 59 | Admitting: Internal Medicine

## 2012-05-28 ENCOUNTER — Other Ambulatory Visit (INDEPENDENT_AMBULATORY_CARE_PROVIDER_SITE_OTHER): Payer: 59

## 2012-05-28 ENCOUNTER — Other Ambulatory Visit: Payer: Self-pay | Admitting: *Deleted

## 2012-05-28 DIAGNOSIS — D649 Anemia, unspecified: Secondary | ICD-10-CM

## 2012-05-28 LAB — CBC
HCT: 37.3 % (ref 36.0–46.0)
RDW: 17.2 % — ABNORMAL HIGH (ref 11.5–14.6)
WBC: 10.9 10*3/uL — ABNORMAL HIGH (ref 4.5–10.5)

## 2012-06-12 ENCOUNTER — Ambulatory Visit (AMBULATORY_SURGERY_CENTER): Payer: 59 | Admitting: *Deleted

## 2012-06-12 VITALS — Ht 60.0 in | Wt 222.0 lb

## 2012-06-12 DIAGNOSIS — Z1211 Encounter for screening for malignant neoplasm of colon: Secondary | ICD-10-CM

## 2012-06-12 NOTE — Progress Notes (Signed)
I instructed [name] to hold her Glucophage the morning of her procedure and to stop her iron starting 06/17/12.  Wyona Almas

## 2012-06-15 ENCOUNTER — Other Ambulatory Visit: Payer: Self-pay | Admitting: Internal Medicine

## 2012-06-15 ENCOUNTER — Other Ambulatory Visit: Payer: Self-pay | Admitting: Gastroenterology

## 2012-06-15 MED ORDER — ESOMEPRAZOLE MAGNESIUM 40 MG PO CPDR: 40.0000 mg | DELAYED_RELEASE_CAPSULE | Freq: Two times a day (BID) | ORAL | Status: DC

## 2012-06-22 ENCOUNTER — Ambulatory Visit: Payer: Self-pay | Admitting: Family Medicine

## 2012-06-25 NOTE — Telephone Encounter (Signed)
See previous note

## 2012-06-29 ENCOUNTER — Other Ambulatory Visit: Payer: Self-pay

## 2012-06-29 ENCOUNTER — Encounter: Payer: Self-pay | Admitting: Internal Medicine

## 2012-06-29 ENCOUNTER — Ambulatory Visit (AMBULATORY_SURGERY_CENTER): Payer: 59 | Admitting: Internal Medicine

## 2012-06-29 VITALS — BP 131/88 | HR 74 | Temp 96.8°F | Resp 15 | Ht 60.0 in | Wt 222.0 lb

## 2012-06-29 DIAGNOSIS — Z1211 Encounter for screening for malignant neoplasm of colon: Secondary | ICD-10-CM

## 2012-06-29 DIAGNOSIS — Z800 Family history of malignant neoplasm of digestive organs: Secondary | ICD-10-CM

## 2012-06-29 HISTORY — PX: COLONOSCOPY: SHX174

## 2012-06-29 LAB — GLUCOSE, CAPILLARY
Glucose-Capillary: 101 mg/dL — ABNORMAL HIGH (ref 70–99)
Glucose-Capillary: 104 mg/dL — ABNORMAL HIGH (ref 70–99)

## 2012-06-29 MED ORDER — SODIUM CHLORIDE 0.9 % IV SOLN: 500.0000 mL | INTRAVENOUS | Status: DC

## 2012-06-29 MED ORDER — ALIGN PO CAPS: 1.0000 | ORAL_CAPSULE | Freq: Every day | ORAL | Status: AC

## 2012-06-29 NOTE — Op Note (Signed)
[facility] [address] [place], [address]   COLONOSCOPY PROCEDURE REPORT  PATIENT: Cristina [name], Cristina [name]  MR#: [id number] BIRTHDATE: [birth year] , 51  yrs. old GENDER: Female ENDOSCOPIST: Roxy Cedar, MD REFERRED ON:[id number] Recall, M.D. PROCEDURE DATE:  06/29/2012 PROCEDURE:   Colonoscopy, screening ASA CLASS:   Class II INDICATIONS:patient's immediate family history of colon cancer. MEDICATIONS: MAC sedation, administered by CRNA and propofol (Diprivan) 200mg   DESCRIPTION OF PROCEDURE:   After the risks benefits and alternatives of the procedure were thoroughly explained, informed consent was obtained.  Digital rectal exam was performed and revealed A digital rectal exam revealed no abnormalities of the rectum.  The LB CF-H180AL [id number]  endoscope was introduced through the anus  and advanced to the cecum, which was identified by both the appendix and ileocecal valve , limited by No adverse events experienced.   The quality of the prep was excellent, using MoviPrep . The instrument was then slowly withdrawn as the colon was fully examined.    FINDINGS:  COLON FINDINGS: The colonic mucosa appeared normal throughout the entire examined colon, cecumm to rectum. Retroflexed views revealed no abnormalities.    The time to cecum = 3:05 minutes minutes. The scope was then withdrawn in 8:35 minutes  minutes from the cecum and the procedure completed.  COMPLICATIONS: A complication of There were no complications.  ENDOSCOPIC IMPRESSION: The colonic mucosa appeared normal throughout the entire examined colon  RECOMMENDATIONS: Follow up colonoscopy in 5 years (family hx)     _______________________________ eSignedRoxy Cedar, MD 06/29/2012 9:26 AM   KG:[id number] Katrinka Blazing, MD; The Patient

## 2012-06-29 NOTE — Progress Notes (Signed)
Per patient request, samples of align po given by Leslie,CMA.

## 2012-06-29 NOTE — Patient Instructions (Signed)
Normal colon exam today. Resume current medications. Repeat colonoscopy in 5 years.  Call us with any questions or concerns.  Thank you!!  YOU HAD AN ENDOSCOPIC PROCEDURE TODAY AT THE [facility]: Refer to the procedure report that was given to you for any specific questions about what was found during the examination.  If the procedure report does not answer your questions, please call your gastroenterologist to clarify.  If you requested that your care partner not be given the details of your procedure findings, then the procedure report has been included in a sealed envelope for you to review at your convenience later.  YOU SHOULD EXPECT: Some feelings of bloating in the abdomen. Passage of more gas than usual.  Walking can help get rid of the air that was put into your GI tract during the procedure and reduce the bloating. If you had a lower endoscopy (such as a colonoscopy or flexible sigmoidoscopy) you may notice spotting of blood in your stool or on the toilet paper. If you underwent a bowel prep for your procedure, then you may not have a normal bowel movement for a few days.  DIET: Your first meal following the procedure should be a light meal and then it is ok to progress to your normal diet.  A half-sandwich or bowl of soup is an example of a good first meal.  Heavy or fried foods are harder to digest and may make you feel nauseous or bloated.  Likewise meals heavy in dairy and vegetables can cause extra gas to form and this can also increase the bloating.  Drink plenty of fluids but you should avoid alcoholic beverages for 24 hours.  ACTIVITY: Your care partner should take you home directly after the procedure.  You should plan to take it easy, moving slowly for the rest of the day.  You can resume normal activity the day after the procedure however you should NOT DRIVE or use heavy machinery for 24 hours (because of the sedation medicines used during the test).    SYMPTOMS TO  REPORT IMMEDIATELY: A gastroenterologist can be reached at any hour.  During normal business hours, 8:30 AM to 5:00 PM Monday through Friday, call [phone].  After hours and on weekends, please call the GI answering service at [phone] who will take a message and have the physician on call contact you.   Following lower endoscopy (colonoscopy or flexible sigmoidoscopy):  Excessive amounts of blood in the stool  Significant tenderness or worsening of abdominal pains  Swelling of the abdomen that is new, acute  Fever of 100F or higher  FOLLOW UP: If any biopsies were taken you will be contacted by phone or by letter within the next 1-3 weeks.  Call your gastroenterologist if you have not heard about the biopsies in 3 weeks.  Our staff will call the home number listed on your records the next business day following your procedure to check on you and address any questions or concerns that you may have at that time regarding the information given to you following your procedure. This is a courtesy call and so if there is no answer at the home number and we have not heard from you through the emergency physician on call, we will assume that you have returned to your regular daily activities without incident.  SIGNATURES/CONFIDENTIALITY: You and/or your care partner have signed paperwork which will be entered into your electronic medical record.  These signatures attest to the fact  that that the information above on your After Visit Summary has been reviewed and is understood.  Full responsibility of the confidentiality of this discharge information lies with you and/or your care-partner.

## 2012-06-29 NOTE — Progress Notes (Signed)
Patient did not experience any of the following events: a burn prior to discharge; a fall within the facility; wrong site/side/patient/procedure/implant event; or a hospital transfer or hospital admission upon discharge from the facility. (G8907) Patient did not have preoperative order for IV antibiotic SSI prophylaxis. (G8918)  

## 2012-06-30 ENCOUNTER — Telehealth: Payer: Self-pay | Admitting: *Deleted

## 2012-06-30 NOTE — Telephone Encounter (Signed)
Message left

## 2012-07-16 ENCOUNTER — Encounter: Payer: Self-pay | Admitting: *Deleted

## 2012-07-16 ENCOUNTER — Encounter: Payer: Self-pay | Admitting: Family Medicine

## 2012-07-20 ENCOUNTER — Ambulatory Visit: Payer: 59

## 2012-07-20 ENCOUNTER — Ambulatory Visit (INDEPENDENT_AMBULATORY_CARE_PROVIDER_SITE_OTHER): Payer: 59 | Admitting: Family Medicine

## 2012-07-20 ENCOUNTER — Encounter: Payer: Self-pay | Admitting: Family Medicine

## 2012-07-20 VITALS — BP 120/90 | HR 82 | Temp 98.3°F | Resp 16 | Ht 61.0 in | Wt 222.2 lb

## 2012-07-20 DIAGNOSIS — E119 Type 2 diabetes mellitus without complications: Secondary | ICD-10-CM

## 2012-07-20 DIAGNOSIS — L989 Disorder of the skin and subcutaneous tissue, unspecified: Secondary | ICD-10-CM

## 2012-07-20 DIAGNOSIS — R0683 Snoring: Secondary | ICD-10-CM

## 2012-07-20 DIAGNOSIS — S161XXA Strain of muscle, fascia and tendon at neck level, initial encounter: Secondary | ICD-10-CM

## 2012-07-20 DIAGNOSIS — D509 Iron deficiency anemia, unspecified: Secondary | ICD-10-CM

## 2012-07-20 DIAGNOSIS — R5383 Other fatigue: Secondary | ICD-10-CM

## 2012-07-20 DIAGNOSIS — E785 Hyperlipidemia, unspecified: Secondary | ICD-10-CM

## 2012-07-20 DIAGNOSIS — R0989 Other specified symptoms and signs involving the circulatory and respiratory systems: Secondary | ICD-10-CM

## 2012-07-20 DIAGNOSIS — E7800 Pure hypercholesterolemia, unspecified: Secondary | ICD-10-CM

## 2012-07-20 DIAGNOSIS — S139XXA Sprain of joints and ligaments of unspecified parts of neck, initial encounter: Secondary | ICD-10-CM

## 2012-07-20 DIAGNOSIS — R0609 Other forms of dyspnea: Secondary | ICD-10-CM

## 2012-07-20 LAB — LIPID PANEL
Cholesterol: 166 mg/dL (ref 0–200)
LDL Cholesterol: 98 mg/dL (ref 0–99)
Total CHOL/HDL Ratio: 3.6 Ratio
VLDL: 22 mg/dL (ref 0–40)

## 2012-07-20 LAB — COMPREHENSIVE METABOLIC PANEL
ALT: 38 U/L — ABNORMAL HIGH (ref 0–35)
AST: 23 U/L (ref 0–37)
Albumin: 4.4 g/dL (ref 3.5–5.2)
Alkaline Phosphatase: 76 U/L (ref 39–117)
BUN: 13 mg/dL (ref 6–23)
Calcium: 10 mg/dL (ref 8.4–10.5)
Chloride: 104 mEq/L (ref 96–112)
Potassium: 4.7 mEq/L (ref 3.5–5.3)
Sodium: 141 mEq/L (ref 135–145)
Total Protein: 7.6 g/dL (ref 6.0–8.3)

## 2012-07-20 LAB — CBC WITH DIFFERENTIAL/PLATELET
Basophils Absolute: 0 10*3/uL (ref 0.0–0.1)
Eosinophils Relative: 3 % (ref 0–5)
Lymphocytes Relative: 40 % (ref 12–46)
Lymphs Abs: 3.8 10*3/uL (ref 0.7–4.0)
MCV: 78 fL (ref 78.0–100.0)
Neutro Abs: 4.9 10*3/uL (ref 1.7–7.7)
Neutrophils Relative %: 52 % (ref 43–77)
Platelets: 410 10*3/uL — ABNORMAL HIGH (ref 150–400)
RBC: 4.95 MIL/uL (ref 3.87–5.11)
RDW: 16.2 % — ABNORMAL HIGH (ref 11.5–15.5)
WBC: 9.5 10*3/uL (ref 4.0–10.5)

## 2012-07-20 LAB — CK: Total CK: 95 U/L (ref 7–177)

## 2012-07-20 MED ORDER — METAXALONE 800 MG PO TABS: 800.0000 mg | ORAL_TABLET | Freq: Three times a day (TID) | ORAL | Status: AC

## 2012-07-20 MED ORDER — PREDNISONE 20 MG PO TABS: ORAL_TABLET | ORAL | Status: DC

## 2012-07-20 NOTE — Progress Notes (Signed)
Subjective:    Patient ID: Cristina [name], female    DOB: [birth year], 51 y.o.   MRN: [id number]  HPI This 51 y.o. female presents to establish care and for three month follow-up:  1.  Hot flashes:  Horrible; has been bad for 3-4 months; requesting medication; s/p hysterectomy but ovaries intact.  2.  Tingling Neck and Shoulder R: onset 3-4 months ago.  Occurs intermittently; +neck pain R side.  No arthritis or DDD neck.  No weakness in arms.  Also carpal tunnel in R hand.  Pain radiates into elbow; numbness radiates into elbow; also radiates into head.  Taken Aleve every 12 hours for past three months.    3. Mole on R neck:  Catching mole on things; needs several spots removed; no dermatologist.  Needs referral.    4. DMII:  Three month follow-up; no changes to management made at last visit; reports good compliance with medication, good tolerance to medication; good symptom control; sugars running 120s-140s.    5.  Hyperlipidemia: three month follow-up; no changes to management made at last visit.  Reports good tolerance to medication, good compliance with medication; good symptom control.   Denies HA, dizziness, focal weakness, paresthesias; denies chest pain, palpitations, shortness of breath, leg swelling.  6.  Fatigue:  Improved but persistent; less morning headaches.  Agreeable to sleep study.       Review of Systems  Constitutional: Positive for fatigue. Negative for fever, chills and diaphoresis.  HENT: Negative for nosebleeds, congestion, rhinorrhea, sneezing and postnasal drip.   Respiratory: Negative for shortness of breath, wheezing and stridor.   Cardiovascular: Negative for chest pain, palpitations and leg swelling.  Gastrointestinal: Negative for nausea, vomiting, abdominal pain, diarrhea and constipation.  Musculoskeletal: Positive for myalgias and arthralgias. Negative for back pain and gait problem.  Skin: Negative for color change, pallor, rash and wound.  Neurological:  Positive for numbness. Negative for dizziness, tremors, syncope, facial asymmetry, speech difficulty, weakness, light-headedness and headaches.    Past Medical History  Diagnosis Date  . Hemorrhoid   . Fissure, anal   . Fibroids     uterine  . Cystocele   . Rectocele   . Diabetes mellitus   . Genital herpes   . Arthritis   . Depression   . Hyperlipidemia   . Obesity   . GERD (gastroesophageal reflux disease)   . CTS (carpal tunnel syndrome)   . EP (ectopic pregnancy) 1989  . Hx of migraine headaches   . Dysfunction of eustachian tube   . Constipation   . Tobacco use disorder   . Allergic rhinitis, cause unspecified   . Iron deficiency anemia, unspecified   . Insomnia     Past Surgical History  Procedure Date  . Cholecystectomy   . Carpal tunnel release 1989    Left  . Abdominal hysterectomy     per dr Katrinka Blazing records vaginal hysterectomy  05/2010  . Ectopic pregnancy surgery 1993  . Tubal ligation 1988  . Cholecystectomy     Prior to Admission medications   Medication Sig Start Date End Date Taking? Authorizing Provider  albuterol (PROAIR HFA) 108 (90 BASE) MCG/ACT inhaler Inhale 2 puffs into the lungs every 6 (six) hours as needed.   Yes Historical Provider, MD  ALPRAZolam Prudy Feeler) 0.5 MG tablet Take 0.5 mg by mouth at bedtime as needed.   Yes Historical Provider, MD  aspirin EC 81 MG tablet Take 81 mg by mouth daily.   Yes Historical Provider, MD  azelastine (ASTELIN) 137 MCG/SPRAY nasal spray Place 1 spray into the nose 2 (two) times daily. Use in each nostril as directed   Yes Historical Provider, MD  bifidobacterium infantis (ALIGN) capsule Take 1 capsule by mouth daily. 06/29/12 06/29/13 Yes Hilarie Fredrickson, MD  BLACK COHOSH ROOT PO Take 450 mg by mouth 2 (two) times daily.   Yes Historical Provider, MD  Calcium Carbonate-Vitamin D (CALCIUM + D PO) Take 1 tablet by mouth daily.   Yes Historical Provider, MD  diltiazem 2 % GEL Apply 1 application topically 2 (two) times  daily.   Yes Historical Provider, MD  DULoxetine (CYMBALTA) 20 MG capsule Take 60 mg by mouth daily.    Yes Historical Provider, MD  esomeprazole (NEXIUM) 40 MG capsule Take 1 capsule (40 mg total) by mouth 2 (two) times daily. 06/15/12  Yes Beverley Fiedler, MD  ferrous sulfate 325 (65 FE) MG EC tablet Take 325 mg by mouth daily.    Yes Historical Provider, MD  fluticasone (FLONASE) 50 MCG/ACT nasal spray Place 2 sprays into the nose daily.   Yes Historical Provider, MD  Glucosamine HCl 1000 MG TABS Take 1,000 mg by mouth 3 (three) times daily.   Yes Historical Provider, MD  hydrocortisone (ANUSOL-HC) 25 MG suppository Place 25 mg rectally 2 (two) times daily.   Yes Historical Provider, MD  ibuprofen (ADVIL,MOTRIN) 600 MG tablet Take 600 mg by mouth every 6 (six) hours as needed.   Yes Historical Provider, MD  loratadine (CLARITIN) 10 MG tablet Take 10 mg by mouth daily.   Yes Historical Provider, MD  metFORMIN (GLUCOPHAGE) 500 MG tablet Take 500 mg by mouth 2 (two) times daily with a meal. Take 2 by mouth two times daily   Yes Historical Provider, MD  Multiple Vitamin (MULTIVITAMIN) capsule Take 1 capsule by mouth daily.   Yes Historical Provider, MD  senna (SENOKOT) 8.6 MG tablet Take 1 tablet by mouth daily.   Yes Historical Provider, MD  sertraline (ZOLOFT) 25 MG tablet Take 50 mg by mouth daily.    Yes Historical Provider, MD  simvastatin (ZOCOR) 40 MG tablet Take 40 mg by mouth every evening.   Yes Historical Provider, MD  valACYclovir (VALTREX) 500 MG tablet Take 500 mg by mouth 2 (two) times daily.    Yes Historical Provider, MD  zolpidem (AMBIEN) 10 MG tablet Take 10 mg by mouth at bedtime as needed.   Yes Historical Provider, MD  amoxicillin-clavulanate (AUGMENTIN) 875-125 MG per tablet Take 1 tablet by mouth 2 (two) times daily. 09/29/12   Ethelda Chick, MD  buPROPion (WELLBUTRIN SR) 100 MG 12 hr tablet Take 100 mg by mouth 2 (two) times daily.    Historical Provider, MD    Casanthranol-Docusate Sodium 30-100 MG CAPS Take by mouth. One capsule every other day.    Historical Provider, MD  predniSONE (DELTASONE) 20 MG tablet 2 tablets daily x 5 days, then 1 tablet daily x 5 days 09/29/12   Ethelda Chick, MD    Allergies  Allergen Reactions  . Fish Oil Rash    History   Social History  . Marital Status: Single    Spouse Name: N/A    Number of Children: 3  . Years of Education: college   Occupational History  . NURSE SECRETARY     [employer]   Social History Main Topics  . Smoking status: Former Games developer  . Smokeless tobacco: Never Used     Comment: smoked occasionally longest 6  mos.  . Alcohol Use: No     Comment: 2 x monthly drinks wine  . Drug Use: No  . Sexually Active: No   Other Topics Concern  . Not on file   Social History Narrative   Drinks coffee and soda's at least one a day. Divorced in 2000 after six years of marriage; not dating.Not sexually active since 2011. No guns in the home. Smoke detectors in use, Always uses seat belts. Exercise: Moderate: riding bicycle 1 day per week for 20 minutes.    Family History  Problem Relation Age of Onset  . Hypertension Mother 25  . Osteoarthritis Mother   . Colon cancer Mother   . Diabetes Mother   . Cancer Mother   . Colon cancer Paternal Grandmother   . Colon cancer      uncle/aunt  . Colon cancer Maternal Grandmother   . Colon polyps Brother   . Cirrhosis Brother     alcoholism       Objective:   Physical Exam  Nursing note and vitals reviewed. Constitutional: She is oriented to person, place, and time. She appears well-developed and well-nourished. No distress.  HENT:  Head: Normocephalic and atraumatic.  Right Ear: External ear normal.  Left Ear: External ear normal.  Nose: Nose normal.  Mouth/Throat: Oropharynx is clear and moist.  Eyes: Conjunctivae normal and EOM are normal. Pupils are equal, round, and reactive to light.  Neck: Normal range of motion. Neck supple. No  thyromegaly present.  Cardiovascular: Normal rate, regular rhythm, normal heart sounds and intact distal pulses.  Exam reveals no gallop and no friction rub.   No murmur heard. Pulmonary/Chest: Effort normal and breath sounds normal. She has no wheezes. She has no rales.  Abdominal: Soft. Bowel sounds are normal. She exhibits no distension. There is no tenderness. There is no rebound and no guarding.  Musculoskeletal:       Right shoulder: She exhibits normal range of motion, no tenderness, no bony tenderness, no pain, no spasm and normal strength.       Left shoulder: She exhibits normal range of motion, no tenderness, no bony tenderness, no pain, no spasm and normal strength.       Cervical back: She exhibits decreased range of motion, tenderness, pain and spasm. She exhibits no bony tenderness.  Lymphadenopathy:    She has no cervical adenopathy.  Neurological: She is alert and oriented to person, place, and time. She has normal reflexes. No cranial nerve deficit. She exhibits normal muscle tone. Coordination normal.  Skin: She is not diaphoretic.       MULTIPLE SKIN TAGS AND MULTIPLE MACULOPAPULAR LESIONS TORSO.  Psychiatric: She has a normal mood and affect. Her behavior is normal. Judgment and thought content normal.       Assessment & Plan:   1. Type II or unspecified type diabetes mellitus without mention of complication, not stated as uncontrolled  Hemoglobin A1c  2. Other and unspecified hyperlipidemia  Comprehensive metabolic panel, Lipid panel  3. Iron deficiency anemia, unspecified  CBC with Differential, CK  4. Neck strain  DG Cervical Spine Complete, DG Cervical Spine Complete, DISCONTINUED: predniSONE (DELTASONE) 20 MG tablet  5. Snoring  Nocturnal polysomnography (NPSG)  6. Fatigue  Nocturnal polysomnography (NPSG)  7. Skin lesions, generalized  Ambulatory referral to Dermatology

## 2012-07-20 NOTE — Patient Instructions (Addendum)
1. Type II or unspecified type diabetes mellitus without mention of complication, not stated as uncontrolled  Hemoglobin A1c  2. Other and unspecified hyperlipidemia  Comprehensive metabolic panel, Lipid panel  3. Iron deficiency anemia, unspecified  CBC with Differential, CK  4. Neck strain  DG Cervical Spine Complete, predniSONE (DELTASONE) 20 MG tablet  5. Snoring  Nocturnal polysomnography (NPSG)  6. Fatigue  Nocturnal polysomnography (NPSG)  7. Skin lesions, generalized  Ambulatory referral to Dermatology

## 2012-07-28 ENCOUNTER — Ambulatory Visit (HOSPITAL_BASED_OUTPATIENT_CLINIC_OR_DEPARTMENT_OTHER): Payer: 59

## 2012-08-14 ENCOUNTER — Ambulatory Visit: Payer: 59

## 2012-08-14 ENCOUNTER — Ambulatory Visit (INDEPENDENT_AMBULATORY_CARE_PROVIDER_SITE_OTHER): Payer: 59 | Admitting: Radiology

## 2012-08-14 DIAGNOSIS — Z23 Encounter for immunization: Secondary | ICD-10-CM

## 2012-08-25 ENCOUNTER — Other Ambulatory Visit: Payer: Self-pay | Admitting: Family Medicine

## 2012-08-25 DIAGNOSIS — Z1231 Encounter for screening mammogram for malignant neoplasm of breast: Secondary | ICD-10-CM

## 2012-09-11 ENCOUNTER — Ambulatory Visit (HOSPITAL_BASED_OUTPATIENT_CLINIC_OR_DEPARTMENT_OTHER): Payer: 59 | Attending: Family Medicine

## 2012-09-11 VITALS — Ht 61.0 in | Wt 222.0 lb

## 2012-09-11 DIAGNOSIS — R0683 Snoring: Secondary | ICD-10-CM

## 2012-09-11 DIAGNOSIS — R5383 Other fatigue: Secondary | ICD-10-CM

## 2012-09-11 DIAGNOSIS — G4733 Obstructive sleep apnea (adult) (pediatric): Secondary | ICD-10-CM | POA: Insufficient documentation

## 2012-09-11 HISTORY — PX: OTHER SURGICAL HISTORY: SHX169

## 2012-09-19 DIAGNOSIS — G4733 Obstructive sleep apnea (adult) (pediatric): Secondary | ICD-10-CM

## 2012-09-19 NOTE — Procedures (Signed)
NAME:  Cristina [name], Cristina [name]                 ACCOUNT NO.:  [id number]  MEDICAL RECORD NO.:  [id number]          PATIENT TYPE:  OUT  LOCATION:  SLEEP CENTER                 FACILITY:  [facility]  PHYSICIAN:  Clinton D. Annamaria Boots, MD, FCCP, FACPDATE OF BIRTH:  11-15-1962  DATE OF STUDY:  09/11/2012                           NOCTURNAL POLYSOMNOGRAM  REFERRING PHYSICIAN:  Reginia Forts, M.D.  REFERRING PHYSICIAN:  Reginia Forts, MD  INDICATION FOR STUDY:  Hypersomnia with sleep apnea.  EPWORTH SLEEPINESS SCORE:  6/24.  BMI 42, weight 222 pounds, height 61 inches, neck 15 inches.  MEDICATIONS:  Home medications are charted and reviewed.  SLEEP ARCHITECTURE:  Total sleep time 303.5 minutes with sleep efficiency 69.1%.  Stage I was 18.9%, stage II 81.1%, stage III and REM were absent.  Sleep latency 41.5 minutes, awake after sleep onset 94 minutes, arousal index 33.4.  Bedtime medication:  Xanax.  RESPIRATORY DATA:  Apnea/hypopnea index (AHI) 45.5 per hour.  A total of 230 events was scored including 35 obstructive apneas, 2 central apneas, 193 hypopneas.  Most events were associated with nonsupine sleep position.  Sleep was extremely fragmented with difficulty maintaining sleep.  This is a diagnostic NPSG protocol as ordered, and CPAP titration was not attempted.  OXYGEN DATA:  Very loud snoring with oxygen desaturation to a nadir of 83% and mean oxygen saturation through the study of 94.8% on room air.  CARDIAC DATA:  Sinus rhythm with PACs.  MOVEMENT/PARASOMNIA:  A total of 115 limb jerks were counted, of which 23 were associated with arousals or awakenings for periodic limb movement with arousal index of 4.5 per hour.  Bathroom x1.  IMPRESSION/RECOMMENDATION: 1. Severe obstructive sleep apnea/hypopnea syndrome, AHI 45.5 per hour     with non-supine events.  Markedly fragmented sleep.  Very loud     snoring with oxygen desaturation to a nadir of 83% and mean oxygen     saturation     through  the study of 94.8% on room air. 2. Consider return for dedicated CPAP titration study or evaluate for     alternative management.     Clinton D. Annamaria Boots, MD, [facility], [place], [place] Board of Sleep Medicine    CDY/MEDQ  D:  09/19/2012 12:21:05  T:  09/19/2012 22:56:20  Job:  [id number]

## 2012-09-22 ENCOUNTER — Telehealth: Payer: Self-pay

## 2012-09-22 DIAGNOSIS — G4730 Sleep apnea, unspecified: Secondary | ICD-10-CM

## 2012-09-22 NOTE — Telephone Encounter (Signed)
Study has been ordered

## 2012-09-22 NOTE — Telephone Encounter (Signed)
IMPRESSION/RECOMMENDATION:  1. Severe obstructive sleep apnea/hypopnea syndrome, AHI 45.5 per hour  with non-supine events. Markedly fragmented sleep. Very loud  snoring with oxygen desaturation to a nadir of 83% and mean oxygen  saturation  through the study of 94.8% on room air.  2. Consider return for dedicated CPAP titration study or evaluate for  alternative management.  Should patient go back for titration study? Please advise I will call patient. Pended order for this study.

## 2012-09-22 NOTE — Telephone Encounter (Signed)
PT STATES SHE HAD A SLEEP STUDY DONE AND WOULD LIKE TO KNOW IF THE RESULTS ARE BACK PLEASE CALL [phone]

## 2012-09-23 NOTE — Telephone Encounter (Signed)
LMOM to CB. 

## 2012-09-23 NOTE — Telephone Encounter (Signed)
Patient called back and was advised.  

## 2012-09-23 NOTE — Telephone Encounter (Signed)
Called pt. Left message for her to call me back.

## 2012-09-24 ENCOUNTER — Ambulatory Visit (HOSPITAL_BASED_OUTPATIENT_CLINIC_OR_DEPARTMENT_OTHER): Payer: 59 | Attending: Physician Assistant | Admitting: Radiology

## 2012-09-24 VITALS — Ht 61.0 in | Wt 222.0 lb

## 2012-09-24 DIAGNOSIS — G4733 Obstructive sleep apnea (adult) (pediatric): Secondary | ICD-10-CM

## 2012-09-24 DIAGNOSIS — G4730 Sleep apnea, unspecified: Secondary | ICD-10-CM

## 2012-09-24 DIAGNOSIS — G4710 Hypersomnia, unspecified: Secondary | ICD-10-CM | POA: Insufficient documentation

## 2012-09-24 DIAGNOSIS — R259 Unspecified abnormal involuntary movements: Secondary | ICD-10-CM | POA: Insufficient documentation

## 2012-09-25 DIAGNOSIS — G4761 Periodic limb movement disorder: Secondary | ICD-10-CM

## 2012-09-25 DIAGNOSIS — G4710 Hypersomnia, unspecified: Secondary | ICD-10-CM

## 2012-09-25 DIAGNOSIS — G4730 Sleep apnea, unspecified: Secondary | ICD-10-CM

## 2012-09-27 NOTE — Procedures (Signed)
NAME:  Cristina [name], Cristina [name]                 ACCOUNT NO.:  [id number]  MEDICAL RECORD NO.:  [id number]          PATIENT TYPE:  OUT  LOCATION:  SLEEP CENTER                 [facility]ACILITY:  [facility]  PHYSICIAN:  Mcadoo Muzquiz D. Maple Hudson, MD, FCCP, FACPDATE OF BIRTH:  1961/06/15  DATE OF STUDY:  09/24/2012                           NOCTURNAL POLYSOMNOGRAM  REFERRING PHYSICIAN:  HEATHER M MARTE  REFERRING PHYSICIAN:  Nilda Simmer, MD  INDICATION FOR STUDY:  Hypersomnia with sleep apnea.  EPWORTH SLEEPINESS SCORE:  14/24.  BMI 41.9, weight 222 pounds, height 61 inches, neck 15 inches.  MEDICATIONS:  Home medications are charted and reviewed.  A baseline diagnostic NPSG on September 11, 2012 recorded AHI 45.5 per hour with body weight 222 pounds.  CPAP titration is requested.  SLEEP ARCHITECTURE:  Total sleep time 350 minutes with sleep efficiency 81.3%.  Stage I was 6.1%, stage II 66%, stage III 2.7%, REM 25.1% of total sleep time.  Sleep latency 1.5 minutes, REM latency 252 minutes. Awake after sleep onset 78.5 minutes.  Arousal index 11.  Bedtime medication:  Ambien, metformin, Valtrex-Acyloin, Probiotic, aspirin.  RESPIRATORY DATA:  CPAP titration protocol.  CPAP was titrated to 12 CWP, AHI 1.6 per hour.  She wore a small ResMed Quattro FX full-face mask with heated humidifier.  OXYGEN DATA:  Snoring was prevented and mean oxygen saturation held 97.6% on CPAP.  CARDIAC DATA:  Sinus rhythm.  MOVEMENT/PARASOMNIA:  A total of 248 limb jerks were counted, of which 22 were associated with arousal or awakening for periodic limb movement with arousal index of 3.8 per hour.  Bathroom x1.  IMPRESSION/RECOMMENDATION: 1. Successful CPAP titration to 12 CWP, AHI 1.6 per hour.  She wore a     small ResMed Quattro FX full-face mask with heated humidifier. 2. In the first part of the study, before midnight, she had frequent     limb jerks which are often seen with first introduction of CPAP.  A     total of 248  limb jerks were counted, of which 22 were associated     with arousal or awakening for periodic limb movement with arousal     index of 3.8 per hour.  If this pattern persists, then     disturbed sleep in the home environment after adjustment to CPAP,     then specific therapy such as ReQuip or Mirapex might be     considered. 3. Baseline diagnostic NPSG 09/11/12 AHI 45.5/ hr. Body weight was 222 lbs.    Dovber Ernest D. Maple Hudson, MD, [facility], FACP Diplomate, American Board of Sleep Medicine    CDY/MEDQ  D:  09/26/2012 12:31:01  T:  09/27/2012 00:08:13  Job:  [id number]

## 2012-09-29 ENCOUNTER — Ambulatory Visit (INDEPENDENT_AMBULATORY_CARE_PROVIDER_SITE_OTHER): Payer: 59 | Admitting: Family Medicine

## 2012-09-29 VITALS — BP 156/102 | HR 108 | Temp 98.7°F | Resp 17 | Ht 60.5 in | Wt 218.0 lb

## 2012-09-29 DIAGNOSIS — J0100 Acute maxillary sinusitis, unspecified: Secondary | ICD-10-CM

## 2012-09-29 DIAGNOSIS — J069 Acute upper respiratory infection, unspecified: Secondary | ICD-10-CM

## 2012-09-29 DIAGNOSIS — R102 Pelvic and perineal pain: Secondary | ICD-10-CM

## 2012-09-29 DIAGNOSIS — N949 Unspecified condition associated with female genital organs and menstrual cycle: Secondary | ICD-10-CM

## 2012-09-29 DIAGNOSIS — S161XXA Strain of muscle, fascia and tendon at neck level, initial encounter: Secondary | ICD-10-CM

## 2012-09-29 DIAGNOSIS — G4733 Obstructive sleep apnea (adult) (pediatric): Secondary | ICD-10-CM

## 2012-09-29 DIAGNOSIS — E119 Type 2 diabetes mellitus without complications: Secondary | ICD-10-CM

## 2012-09-29 MED ORDER — AMOXICILLIN-POT CLAVULANATE 875-125 MG PO TABS: 1.0000 | ORAL_TABLET | Freq: Two times a day (BID) | ORAL | Status: DC

## 2012-09-29 MED ORDER — PREDNISONE 20 MG PO TABS: ORAL_TABLET | ORAL | Status: DC

## 2012-09-29 NOTE — Patient Instructions (Addendum)
1. Sinusitis, acute maxillary  amoxicillin-clavulanate (AUGMENTIN) 875-125 MG per tablet, predniSONE (DELTASONE) 20 MG tablet  2. Acute upper respiratory infections of unspecified site    3. Diabetes mellitus type II, controlled    4. OSA (obstructive sleep apnea)  Ambulatory referral to Home Health  5. Neck strain  predniSONE (DELTASONE) 20 MG tablet  6. Pain, pelvic, female

## 2012-09-29 NOTE — Progress Notes (Signed)
[address]   [place], [place]  [address]   [phone]  Subjective:    Patient ID: Cristina [name], female    DOB: 1961/04/07, 51 y.o.   MRN: [id number]  HPIThis 51 y.o. female presents for evaluation of head and chest congestion.  Onset two weeks ago.  Worsened in past three days.  No fever/chills/sweats.  +HA intermittent.  No sinus pressure.  +PND.  No ear pain; +scratchy throat.  No rhinorrhea; +nasal congestion white-yellow and moderately thick.  +cough; no sputum production; no SOB; no wheezing.  No n/v/d.  Claritin once daily, Flonase once daily, Astelin bid.     2.  Hypersomnolence:  S/p sleep study that showed severe OSA; s/p CPAP titration with severe limb jerking.    3.  L groin pain: wants to be checked for UTI; onset one week ago.  Started exercising on stationary bike; might be a pulled muscle.  Started exercise three weeks ago.  Bothers with walking.  No nighttime awakening.  No dysuria, urgency, frequency, nocturia, hematuria.  +vaginal discharge yellow chronic without worsening.   Review of Systems  Constitutional: Positive for fatigue. Negative for fever, chills and diaphoresis.  HENT: Positive for congestion, rhinorrhea, sneezing, voice change, postnasal drip and sinus pressure. Negative for ear pain, sore throat, drooling and trouble swallowing.   Respiratory: Positive for cough. Negative for shortness of breath and wheezing.   Gastrointestinal: Negative for nausea, vomiting, abdominal pain, diarrhea and constipation.  Genitourinary: Positive for vaginal discharge and pelvic pain. Negative for dysuria, urgency, frequency, hematuria, flank pain, difficulty urinating, genital sores, vaginal pain and dyspareunia.       Objective:   Physical Exam  Nursing note and vitals reviewed. Constitutional: She is oriented to person, place, and time. She appears well-developed and well-nourished. No distress.  HENT:  Head: Normocephalic and atraumatic.  Right Ear: External ear  normal.  Left Ear: External ear normal.  Nose: Mucosal edema and rhinorrhea present. Right sinus exhibits maxillary sinus tenderness. Right sinus exhibits no frontal sinus tenderness. Left sinus exhibits maxillary sinus tenderness. Left sinus exhibits no frontal sinus tenderness.  Mouth/Throat: Uvula is midline and oropharynx is clear and moist.  Eyes: Conjunctivae normal and EOM are normal. Pupils are equal, round, and reactive to light.  Neck: Normal range of motion. Neck supple.  Cardiovascular: Normal rate, regular rhythm and normal heart sounds.   No murmur heard. Pulmonary/Chest: Effort normal and breath sounds normal. She has no wheezes. She has no rales.  Abdominal: Soft. Bowel sounds are normal. She exhibits no distension and no mass. There is no tenderness. There is no rebound and no guarding.  Musculoskeletal:       Right hip: She exhibits tenderness. She exhibits normal range of motion, normal strength, no bony tenderness and no swelling.  Lymphadenopathy:    She has cervical adenopathy.  Neurological: She is alert and oriented to person, place, and time.  Skin: Skin is warm and dry. No rash noted. She is not diaphoretic.  Psychiatric: She has a normal mood and affect. Her behavior is normal.       Assessment & Plan:   1. Sinusitis, acute maxillary  DISCONTINUED: amoxicillin-clavulanate (AUGMENTIN) 875-125 MG per tablet, DISCONTINUED: predniSONE (DELTASONE) 20 MG tablet  2. Acute upper respiratory infections of unspecified site    3. Diabetes mellitus type II, controlled    4. OSA (obstructive sleep apnea)  Ambulatory referral to Home Health  5. Neck strain  DISCONTINUED: predniSONE (DELTASONE) 20 MG tablet  6. Pain, pelvic, female      1. Acute Sinusitis:  New.  Rx for Augmentin provided. Continue Astelin, Flonase; recommend Mucinex DM for cough. 2.  URI:  New.  Etiology to acute sinusitis. 3.  DMII: controlled; monitor sugars closely with acute infection. 4. OSA: New.   Refer for CPAP delivery 5. Pelvic pain R: New.  No associated GI, GU symptoms.  May very well be muscular.   No evidence of UTI.  Monitor closely over next several weeks.  If persists, will warrant further evaluation.  Meds ordered this encounter  Medications  . DISCONTD: buPROPion (WELLBUTRIN SR) 100 MG 12 hr tablet    Sig: Take 100 mg by mouth 2 (two) times daily.  [name] DISCONTD: amoxicillin-clavulanate (AUGMENTIN) 875-125 MG per tablet    Sig: Take 1 tablet by mouth 2 (two) times daily.    Dispense:  20 tablet    Refill:  0  . DISCONTD: predniSONE (DELTASONE) 20 MG tablet    Sig: 2 tablets daily x 5 days, then 1 tablet daily x 5 days    Dispense:  15 tablet    Refill:  0

## 2012-10-01 DIAGNOSIS — D509 Iron deficiency anemia, unspecified: Secondary | ICD-10-CM | POA: Insufficient documentation

## 2012-10-01 DIAGNOSIS — E785 Hyperlipidemia, unspecified: Secondary | ICD-10-CM | POA: Insufficient documentation

## 2012-10-01 DIAGNOSIS — L989 Disorder of the skin and subcutaneous tissue, unspecified: Secondary | ICD-10-CM | POA: Insufficient documentation

## 2012-10-01 DIAGNOSIS — R0683 Snoring: Secondary | ICD-10-CM | POA: Insufficient documentation

## 2012-10-01 DIAGNOSIS — R5383 Other fatigue: Secondary | ICD-10-CM | POA: Insufficient documentation

## 2012-10-01 DIAGNOSIS — S161XXA Strain of muscle, fascia and tendon at neck level, initial encounter: Secondary | ICD-10-CM | POA: Insufficient documentation

## 2012-10-01 NOTE — Assessment & Plan Note (Signed)
Persistent; associated with snoring, obesity, DMII. Refer for sleep study.

## 2012-10-01 NOTE — Assessment & Plan Note (Signed)
New.  Refer to dermatology.

## 2012-10-01 NOTE — Assessment & Plan Note (Signed)
Controlled; obtain labs; continue current medications. 

## 2012-10-01 NOTE — Assessment & Plan Note (Signed)
Persistent; associated with daytime fatigue.  Refer for sleep study.

## 2012-10-01 NOTE — Assessment & Plan Note (Signed)
Stable; repeat labs today.

## 2012-10-01 NOTE — Assessment & Plan Note (Signed)
New. With radiation into arms; obtain C-spine films; treat with Prednisone, range of motion exercises.  If no improvement in 2-4 weeks, to call office for referral to ortho.

## 2012-10-02 NOTE — Progress Notes (Signed)
Reviewed and agree.

## 2012-10-07 ENCOUNTER — Telehealth: Payer: Self-pay

## 2012-10-07 NOTE — Telephone Encounter (Signed)
PT STATES SHE HAVE A UTI AND DR Tamala Julian THOUGHT MAYBE THE ARGUMENT IN  SHE WAS GIVEN MAY HELP BUT IT HASN'T AND SHE IS IN A LOT OF PAIN PLEASE CALL [phone]    [facility] OUTPATIENT

## 2012-10-07 NOTE — Telephone Encounter (Signed)
Pt took Augmentin for sinusitis. Need to speak to patient , need more information.

## 2012-10-08 ENCOUNTER — Encounter: Payer: Self-pay | Admitting: Physician Assistant

## 2012-10-08 ENCOUNTER — Ambulatory Visit (INDEPENDENT_AMBULATORY_CARE_PROVIDER_SITE_OTHER): Payer: 59 | Admitting: Family Medicine

## 2012-10-08 VITALS — BP 113/70 | HR 92 | Temp 97.8°F | Resp 16 | Ht 60.0 in | Wt 221.0 lb

## 2012-10-08 DIAGNOSIS — R52 Pain, unspecified: Secondary | ICD-10-CM

## 2012-10-08 DIAGNOSIS — R300 Dysuria: Secondary | ICD-10-CM

## 2012-10-08 DIAGNOSIS — R1032 Left lower quadrant pain: Secondary | ICD-10-CM

## 2012-10-08 LAB — POCT UA - MICROSCOPIC ONLY: Mucus, UA: POSITIVE

## 2012-10-08 LAB — POCT URINALYSIS DIPSTICK
Bilirubin, UA: NEGATIVE
Blood, UA: NEGATIVE
Glucose, UA: NEGATIVE
Ketones, UA: NEGATIVE
Spec Grav, UA: >=1.03

## 2012-10-08 MED ORDER — HYDROCODONE-ACETAMINOPHEN 5-500 MG PO TABS: 1.0000 | ORAL_TABLET | Freq: Three times a day (TID) | ORAL | Status: DC | PRN

## 2012-10-08 NOTE — Progress Notes (Signed)
  Subjective:    Patient ID: Cristina [name], female    DOB: [birth year], 51 y.o.   MRN: [id number]  HPI 51 yr old CF presents with L sided abdominal pain for the past several weeks that worsened last night.  She had colonoscopy in august of this year and that was normal and did not reveal any diverticula. She occasionally has dysuria and urinating seems to exacerbate the pain in her L side.  She has no history of kidney stones. No vaginal discharge. She has had a partial hysterectomy. She has not had f/c. Her appetite seems unaffected. Aleve relieved the pain partially.  Describes the pain as sharp occasionally and a dull ache is there most of the time. She just finished a course of augmentin for sinusitis.  Blood sugars have been good. BP much improved from last visit.  Review of Systems  All other systems reviewed and are negative.       Objective:   Physical Exam  Nursing note and vitals reviewed. Constitutional: She is oriented to person, place, and time. She appears well-developed and well-nourished.  HENT:  Head: Normocephalic and atraumatic.  Mouth/Throat: No oropharyngeal exudate.  Cardiovascular: Normal rate, regular rhythm and normal heart sounds.   Pulmonary/Chest: Effort normal and breath sounds normal.  Abdominal: Soft. Bowel sounds are normal. She exhibits no distension and no mass. There is no tenderness. There is no rebound and no guarding.       Exam limited by obesity. But definitely no peritoneal signs.  Neurological: She is alert and oriented to person, place, and time.  Skin: Skin is warm and dry.  Psychiatric: She has a normal mood and affect. Her behavior is normal.   Patient does not appear at all toxic!   Results for orders placed in visit on 10/08/12  POCT UA - MICROSCOPIC ONLY      Component Value Range   WBC, Ur, HPF, POC 4-6     RBC, urine, microscopic 1-3     Bacteria, U Microscopic 1+     Mucus, UA positive     Epithelial cells, urine per micros 6-8     Crystals, Ur, HPF, POC positive oxalate     Casts, Ur, LPF, POC neg     Yeast, UA neg    POCT URINALYSIS DIPSTICK      Component Value Range   Color, UA yellow     Clarity, UA clear     Glucose, UA neg     Bilirubin, UA neg     Ketones, UA neg     Spec Grav, UA >=1.030     Blood, UA neg     pH, UA 5.5     Protein, UA neg     Urobilinogen, UA 0.2     Nitrite, UA neg     Leukocytes, UA small (1+)         Assessment & Plan:  UTI and likely nephrolithiasis (oxalate crystals in urine)-Doxycycline 100mg  1 bid #20 given from our stock bottle. Urine being sent for culture. Drink water!!! To ER if pain worsens.  Aleve is ok to take.  Add the vicodin as needed.  CT scan tomorrow. Discussed with Dr. Neva Seat.

## 2012-10-08 NOTE — Telephone Encounter (Signed)
Spoke to patient. She is having pain in her lower abd. after urinating. No burning. Suggested that she come into the clinic to have her urine checked and be given a proper antibiotic if needed. Gave her our hours.

## 2012-10-09 ENCOUNTER — Ambulatory Visit (HOSPITAL_COMMUNITY)
Admission: RE | Admit: 2012-10-09 | Discharge: 2012-10-09 | Disposition: A | Discharge status: Home / Self Care | Payer: 59 | Source: Ambulatory Visit | Attending: Physician Assistant | Admitting: Physician Assistant

## 2012-10-09 ENCOUNTER — Telehealth: Payer: Self-pay

## 2012-10-09 DIAGNOSIS — R300 Dysuria: Secondary | ICD-10-CM

## 2012-10-09 DIAGNOSIS — R1032 Left lower quadrant pain: Secondary | ICD-10-CM | POA: Insufficient documentation

## 2012-10-09 DIAGNOSIS — R160 Hepatomegaly, not elsewhere classified: Secondary | ICD-10-CM | POA: Insufficient documentation

## 2012-10-09 NOTE — Progress Notes (Signed)
patient discussed with Georgian Co, PA-C. Agree with assessment and plan of care per her note.

## 2012-10-09 NOTE — Telephone Encounter (Signed)
I called for the report of the CT Scan, there is no kidney stone or hydronephrosis to explain patients left sided abd pain. 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 Cristina [name]

## 2012-10-09 NOTE — Telephone Encounter (Signed)
Called patient regarding CT results. Advised of no source for her pain.   Pain has eased off past hour, but was sore earlier today. No fever, or vomiting. Sore if has to urinate.  BM x 2-3 today. Plan to follow up with Dr. Dareen Piano this weekend.

## 2012-10-09 NOTE — Telephone Encounter (Signed)
PATIENT CALLED IN REGARDS TO WANTING TO KNOW LAB RESULTS. PLEASE CALL WHEN THIS IS READY. PATIENT WAS NOTIFIED SHE WILL GET A CALL WHEN RESULTS ARE BACK. THANK YOU!

## 2012-10-09 NOTE — Addendum Note (Signed)
Addended by: Kem Boroughs D on: 10/09/2012 10:46 AM   Modules accepted: Orders

## 2012-10-09 NOTE — Addendum Note (Signed)
Addended by: Kem Boroughs D on: 10/09/2012 09:17 AM   Modules accepted: Orders

## 2012-10-10 LAB — URINE CULTURE: Colony Count: 50000

## 2012-10-14 ENCOUNTER — Ambulatory Visit (INDEPENDENT_AMBULATORY_CARE_PROVIDER_SITE_OTHER): Payer: 59 | Admitting: Family Medicine

## 2012-10-14 VITALS — BP 106/64 | HR 90 | Temp 98.2°F | Resp 18 | Ht 61.0 in | Wt 221.0 lb

## 2012-10-14 DIAGNOSIS — B3731 Acute candidiasis of vulva and vagina: Secondary | ICD-10-CM

## 2012-10-14 DIAGNOSIS — B373 Candidiasis of vulva and vagina: Secondary | ICD-10-CM

## 2012-10-14 DIAGNOSIS — R300 Dysuria: Secondary | ICD-10-CM

## 2012-10-14 DIAGNOSIS — N760 Acute vaginitis: Secondary | ICD-10-CM

## 2012-10-14 LAB — POCT URINALYSIS DIPSTICK
Bilirubin, UA: NEGATIVE
Glucose, UA: NEGATIVE
Ketones, UA: NEGATIVE
Leukocytes, UA: NEGATIVE

## 2012-10-14 LAB — POCT UA - MICROSCOPIC ONLY
Mucus, UA: POSITIVE
Yeast, UA: POSITIVE

## 2012-10-14 LAB — POCT WET PREP WITH KOH
Clue Cells Wet Prep HPF POC: 100
Trichomonas, UA: NEGATIVE

## 2012-10-14 MED ORDER — TERCONAZOLE 0.4 % VA CREA: 1.0000 | TOPICAL_CREAM | Freq: Every day | VAGINAL | Status: DC

## 2012-10-14 MED ORDER — FLUCONAZOLE 150 MG PO TABS: 150.0000 mg | ORAL_TABLET | Freq: Once | ORAL | Status: DC

## 2012-10-14 NOTE — Progress Notes (Signed)
[place], [place]  [address]   [phone]  Subjective:    Patient ID: Cristina [name], female    DOB: March 04, 1961, 51 y.o.   MRN: [id number]  HPIThis 51 y.o. female presents for evaluation of vaginal irritation.  Onset one week ago.  Evaluated one week ago with UTI; s/p Augmentin for sinusitis at last visit.  Rx for Doxycycline; still taking medication; total of ten days.  No fever/chills/sweats.  No flank pain.  +dysuria with UTI; now burning again due to yeast infection. +rash in genital region.  No hematuria.  No urgency.  No worsening nocturia.  No vaginal discharge.  +vaginal itching.  Alcohol in vaginal area.  S/p CT abd/pelvis to rule out stone; negative CT abd/pelvis.  Review of Systems  Constitutional: Negative for fever, chills, diaphoresis and fatigue.  Gastrointestinal: Negative for nausea, vomiting and abdominal pain.  Genitourinary: Positive for dysuria. Negative for urgency, frequency, hematuria, flank pain, vaginal bleeding, vaginal discharge, genital sores and pelvic pain.       Vaginal itching.    Past Medical History  Diagnosis Date  . Hemorrhoid   . Fissure, anal   . Fibroids     uterine  . Cystocele   . Rectocele   . Diabetes mellitus   . Genital herpes   . Arthritis   . Depression   . Hyperlipidemia   . Obesity   . GERD (gastroesophageal reflux disease)   . CTS (carpal tunnel syndrome)   . EP (ectopic pregnancy) 1989  . Hx of migraine headaches   . Dysfunction of eustachian tube   . Constipation   . Tobacco use disorder   . Allergic rhinitis, cause unspecified   . Iron deficiency anemia, unspecified   . Insomnia     Past Surgical History  Procedure Date  . Cholecystectomy   . Carpal tunnel release 1989    Left  . Abdominal hysterectomy     per dr Tamala Julian records vaginal hysterectomy  05/2010  . Ectopic pregnancy surgery 1993  . Tubal ligation 1988  . Cholecystectomy     Prior to Admission medications   Medication Sig Start Date  End Date Taking? Authorizing Provider  albuterol (PROAIR HFA) 108 (90 BASE) MCG/ACT inhaler Inhale 2 puffs into the lungs every 6 (six) hours as needed.   Yes Historical Provider, MD  ALPRAZolam Duanne Moron) 0.5 MG tablet Take 0.5 mg by mouth at bedtime as needed.   Yes Historical Provider, MD  aspirin EC 81 MG tablet Take 81 mg by mouth daily.   Yes Historical Provider, MD  azelastine (ASTELIN) 137 MCG/SPRAY nasal spray Place 1 spray into the nose 2 (two) times daily. Use in each nostril as directed   Yes Historical Provider, MD  bifidobacterium infantis (ALIGN) capsule Take 1 capsule by mouth daily. 06/29/12 06/29/13 Yes Irene Shipper, MD  buPROPion [facility] SR) 100 MG 12 hr tablet Take 100 mg by mouth 2 (two) times daily.   Yes Historical Provider, MD  Calcium Carbonate-Vitamin D (CALCIUM + D PO) Take 1 tablet by mouth daily.   Yes Historical Provider, MD  diltiazem 2 % GEL Apply 1 application topically 2 (two) times daily.   Yes Historical Provider, MD  esomeprazole (NEXIUM) 40 MG capsule Take 1 capsule (40 mg total) by mouth 2 (two) times daily. 06/15/12  Yes Jerene Bears, MD  ferrous sulfate 325 (65 FE) MG EC tablet Take 325 mg by mouth daily.    Yes Historical Provider,  MD  fluticasone (FLONASE) 50 MCG/ACT nasal spray Place 2 sprays into the nose daily.   Yes Historical Provider, MD  Glucosamine HCl 1000 MG TABS Take 1,000 mg by mouth 3 (three) times daily.   Yes Historical Provider, MD  hydrocortisone (ANUSOL-HC) 25 MG suppository Place 25 mg rectally 2 (two) times daily.   Yes Historical Provider, MD  ibuprofen (ADVIL,MOTRIN) 600 MG tablet Take 600 mg by mouth every 6 (six) hours as needed.   Yes Historical Provider, MD  loratadine (CLARITIN) 10 MG tablet Take 10 mg by mouth daily.   Yes Historical Provider, MD  metFORMIN (GLUCOPHAGE) 500 MG tablet Take 500 mg by mouth 2 (two) times daily with a meal. Take 2 by mouth two times daily   Yes Historical Provider, MD  Multiple Vitamin (MULTIVITAMIN)  capsule Take 1 capsule by mouth daily.   Yes Historical Provider, MD  senna (SENOKOT) 8.6 MG tablet Take 1 tablet by mouth daily.   Yes Historical Provider, MD  sertraline (ZOLOFT) 25 MG tablet Take 50 mg by mouth daily.    Yes Historical Provider, MD  simvastatin (ZOCOR) 40 MG tablet Take 40 mg by mouth every evening.   Yes Historical Provider, MD  valACYclovir (VALTREX) 500 MG tablet Take 500 mg by mouth 2 (two) times daily.    Yes Historical Provider, MD  zolpidem (AMBIEN) 10 MG tablet Take 10 mg by mouth at bedtime as needed.   Yes Historical Provider, MD  amoxicillin-clavulanate (AUGMENTIN) 875-125 MG per tablet Take 1 tablet by mouth 2 (two) times daily. 09/29/12   [name], MD  BLACK COHOSH ROOT PO Take 450 mg by mouth 2 (two) times daily.    Historical Provider, MD  Casanthranol-Docusate Sodium 30-100 MG CAPS Take by mouth. One capsule every other day.    Historical Provider, MD  DULoxetine (CYMBALTA) 20 MG capsule Take 60 mg by mouth daily.     Historical Provider, MD  fluconazole (DIFLUCAN) 150 MG tablet Take 1 tablet (150 mg total) by mouth once. Repeat if needed 10/14/12   [name], MD  HYDROcodone-acetaminophen (VICODIN) 5-500 MG per tablet Take 1 tablet by mouth every 8 (eight) hours as needed for pain. 10/08/12   Argentina Donovan, PA-C  predniSONE (DELTASONE) 20 MG tablet 2 tablets daily x 5 days, then 1 tablet daily x 5 days 09/29/12   [name], MD  terconazole (TERAZOL 7) 0.4 % vaginal cream Place 1 applicator vaginally at bedtime. 10/14/12   [name], MD    Allergies  Allergen Reactions  . Fish Oil Rash    History   Social History  . Marital Status: Single    Spouse Name: N/A    Number of Children: 3  . Years of Education: college   Occupational History  . NURSE SECRETARY     [employer]   Social History Main Topics  . Smoking status: Former Research scientist (life sciences)  . Smokeless tobacco: Never Used     Comment: smoked occasionally longest 6 mos.  . Alcohol Use: No       Comment: 2 x monthly drinks wine  . Drug Use: No  . Sexually Active: No   Other Topics Concern  . Not on file   Social History Narrative   Drinks coffee and soda's at least one a day. Divorced in 2000 after six years of marriage; not dating.Not sexually active since 2011. No guns in the home. Smoke detectors in use, Always uses seat belts. Exercise: Moderate: riding  bicycle 1 day per week for 20 minutes.    Family History  Problem Relation Age of Onset  . Hypertension Mother 59  . Osteoarthritis Mother   . Colon cancer Mother   . Diabetes Mother   . Cancer Mother   . Colon cancer Paternal Grandmother   . Colon cancer      uncle/aunt  . Colon cancer Maternal Grandmother   . Colon polyps Brother   . Cirrhosis Brother     alcoholism       Objective:   Physical Exam  Nursing note and vitals reviewed. Constitutional: She appears well-developed and well-nourished. No distress.  Cardiovascular: Normal rate, regular rhythm and normal heart sounds.  Exam reveals no gallop and no friction rub.   No murmur heard. Pulmonary/Chest: Effort normal and breath sounds normal. She has no wheezes. She has no rales.  Abdominal: Soft. Bowel sounds are normal. She exhibits no distension and no mass. There is no tenderness. There is no rebound and no guarding.  Genitourinary: There is rash on the right labia. There is no tenderness, lesion or injury on the right labia. There is rash on the left labia. There is no tenderness, lesion or injury on the left labia. Right adnexum displays no mass, no tenderness and no fullness. Left adnexum displays no mass, no tenderness and no fullness. There is erythema around the vagina. No tenderness or bleeding around the vagina. No foreign body around the vagina. Vaginal discharge found.       Erythema diffusely external labia.  Skin: She is not diaphoretic. There is erythema.  Psychiatric: She has a normal mood and affect. Her behavior is normal.       Results for orders placed in visit on 10/14/12  POCT WET PREP WITH KOH      Component Value Range   Trichomonas, UA Negative     Clue Cells Wet Prep HPF POC 100%     Epithelial Wet Prep HPF POC 10-12     Yeast Wet Prep HPF POC neg     Bacteria Wet Prep HPF POC 2+     RBC Wet Prep HPF POC 0-3     WBC Wet Prep HPF POC 0-3     KOH Prep POC Positive    POCT URINALYSIS DIPSTICK      Component Value Range   Color, UA yellow     Clarity, UA sl cloudy     Glucose, UA neg     Bilirubin, UA neg     Ketones, UA neg     Spec Grav, UA >=1.030     Blood, UA neg     pH, UA 5.0     Protein, UA neg     Urobilinogen, UA 0.2     Nitrite, UA neg     Leukocytes, UA Negative    POCT UA - MICROSCOPIC ONLY      Component Value Range   WBC, Ur, HPF, POC 3-5     RBC, urine, microscopic 0-3     Bacteria, U Microscopic 1+     Mucus, UA positive     Epithelial cells, urine per micros 6-8     Crystals, Ur, HPF, POC oxalate     Casts, Ur, LPF, POC neg     Yeast, UA positive      Assessment & Plan:   1. Vaginitis  POCT Wet Prep with KOH, POCT urinalysis dipstick, POCT UA - Microscopic Only  2. Dysuria  Urine culture  3.  Candidiasis of vulva and vagina  fluconazole (DIFLUCAN) 150 MG tablet, terconazole (TERAZOL 7) 0.4 % vaginal cream     1.  Candidiasis Vaginal:  New.  Rx for Diflucan and Terazol provided. 2.  UTI/dysuria:  S/p Doxycycline with improvement; s/p CT abd/pelvis after last visit negative for stone or acute pathology.  Send urine culture.   Meds ordered this encounter  Medications  . fluconazole (DIFLUCAN) 150 MG tablet    Sig: Take 1 tablet (150 mg total) by mouth once. Repeat if needed    Dispense:  2 tablet    Refill:  0  . terconazole (TERAZOL 7) 0.4 % vaginal cream    Sig: Place 1 applicator vaginally at bedtime.    Dispense:  45 g    Refill:  0

## 2012-10-14 NOTE — Patient Instructions (Addendum)
1. Vaginitis  POCT Wet Prep with KOH, POCT urinalysis dipstick, POCT UA - Microscopic Only  2. Dysuria  Urine culture  3. Candidiasis of vulva and vagina  fluconazole (DIFLUCAN) 150 MG tablet, terconazole (TERAZOL 7) 0.4 % vaginal cream   Candida Infection, Adult A candida infection (also called yeast, fungus and Monilia infection) is an overgrowth of yeast that can occur anywhere on the body. A yeast infection commonly occurs in warm, moist body areas. Usually, the infection remains localized but can spread to become a systemic infection. A yeast infection may be a sign of a more severe disease such as diabetes, leukemia, or AIDS. A yeast infection can occur in both men and women. In women, Candida vaginitis is a vaginal infection. It is one of the most common causes of vaginitis. Men usually do not have symptoms or know they have an infection until other problems develop. Men may find out they have a yeast infection because their sex partner has a yeast infection. Uncircumcised men are more likely to get a yeast infection than circumcised men. This is because the uncircumcised glans is not exposed to air and does not remain as dry as that of a circumcised glans. Older adults may develop yeast infections around dentures. CAUSES  Women  Antibiotics.  Steroid medication taken for a long time.  Being overweight (obese).  Diabetes.  Poor immune condition.  Certain serious medical conditions.  Immune suppressive medications for organ transplant patients.  Chemotherapy.  Pregnancy.  Menstration.  Stress and fatigue.  Intravenous drug use.  Oral contraceptives.  Wearing tight-fitting clothes in the crotch area.  Catching it from a sex partner who has a yeast infection.  Spermicide.  Intravenous, urinary, or other catheters. Men  Catching it from a sex partner who has a yeast infection.  Having oral or anal sex with a person who has the  infection.  Spermicide.  Diabetes.  Antibiotics.  Poor immune system.  Medications that suppress the immune system.  Intravenous drug use.  Intravenous, urinary, or other catheters. SYMPTOMS  Women  Thick, white vaginal discharge.  Vaginal itching.  Redness and swelling in and around the vagina.  Irritation of the lips of the vagina and perineum.  Blisters on the vaginal lips and perineum.  Painful sexual intercourse.  Low blood sugar (hypoglycemia).  Painful urination.  Bladder infections.  Intestinal problems such as constipation, indigestion, bad breath, bloating, increase in gas, diarrhea, or loose stools. Men  Men may develop intestinal problems such as constipation, indigestion, bad breath, bloating, increase in gas, diarrhea, or loose stools.  Dry, cracked skin on the penis with itching or discomfort.  Jock itch.  Dry, flaky skin.  Athlete's foot.  Hypoglycemia. DIAGNOSIS  Women  A history and an exam are performed.  The discharge may be examined under a microscope.  A culture may be taken of the discharge. Men  A history and an exam are performed.  Any discharge from the penis or areas of cracked skin will be looked at under the microscope and cultured.  Stool samples may be cultured. TREATMENT  Women  Vaginal antifungal suppositories and creams.  Medicated creams to decrease irritation and itching on the outside of the vagina.  Warm compresses to the perineal area to decrease swelling and discomfort.  Oral antifungal medications.  Medicated vaginal suppositories or cream for repeated or recurrent infections.  Wash and dry the irritation areas before applying the cream.  Eating yogurt with lactobacillus may help with prevention and treatment.  Sometimes painting the vagina with gentian violet solution may help if creams and suppositories do not work. Men  Antifungal creams and oral antifungal medications.  Sometimes  treatment must continue for 30 days after the symptoms go away to prevent recurrence. HOME CARE INSTRUCTIONS  Women  Use cotton underwear and avoid tight-fitting clothing.  Avoid colored, scented toilet paper and deodorant tampons or pads.  Do not douche.  Keep your diabetes under control.  Finish all the prescribed medications.  Keep your skin clean and dry.  Consume milk or yogurt with lactobacillus active culture regularly. If you get frequent yeast infections and think that is what the infection is, there are over-the-counter medications that you can get. If the infection does not show healing in 3 days, talk to your caregiver.  Tell your sex partner you have a yeast infection. Your partner may need treatment also, especially if your infection does not clear up or recurs. Men  Keep your skin clean and dry.  Keep your diabetes under control.  Finish all prescribed medications.  Tell your sex partner that you have a yeast infection so they can be treated if necessary. SEEK MEDICAL CARE IF:   Your symptoms do not clear up or worsen in one week after treatment.  You have an oral temperature above 102 F (38.9 C).  You have trouble swallowing or eating for a prolonged time.  You develop blisters on and around your vagina.  You develop vaginal bleeding and it is not your menstrual period.  You develop abdominal pain.  You develop intestinal problems as mentioned above.  You get weak or lightheaded.  You have painful or increased urination.  You have pain during sexual intercourse. MAKE SURE YOU:   Understand these instructions.  Will watch your condition.  Will get help right away if you are not doing well or get worse. Document Released: 12/05/2004 Document Revised: 01/20/2012 Document Reviewed: 03/19/2010 [facility] Patient Information 2013 [place], [place].

## 2012-10-17 NOTE — Progress Notes (Signed)
Reviewed and agree.

## 2012-10-19 ENCOUNTER — Ambulatory Visit: Payer: 59 | Admitting: Family Medicine

## 2012-10-20 ENCOUNTER — Ambulatory Visit
Admission: RE | Admit: 2012-10-20 | Discharge: 2012-10-20 | Disposition: A | Discharge status: Home / Self Care | Payer: 59 | Source: Ambulatory Visit | Attending: Family Medicine | Admitting: Family Medicine

## 2012-10-20 ENCOUNTER — Encounter: Payer: Self-pay | Admitting: Family Medicine

## 2012-10-20 ENCOUNTER — Telehealth: Payer: Self-pay | Admitting: Radiology

## 2012-10-20 ENCOUNTER — Ambulatory Visit: Payer: Self-pay

## 2012-10-20 ENCOUNTER — Ambulatory Visit (INDEPENDENT_AMBULATORY_CARE_PROVIDER_SITE_OTHER): Payer: 59 | Admitting: Family Medicine

## 2012-10-20 VITALS — BP 134/86 | HR 74 | Temp 98.3°F | Resp 18 | Ht 61.0 in | Wt 222.0 lb

## 2012-10-20 DIAGNOSIS — E119 Type 2 diabetes mellitus without complications: Secondary | ICD-10-CM

## 2012-10-20 DIAGNOSIS — F329 Major depressive disorder, single episode, unspecified: Secondary | ICD-10-CM

## 2012-10-20 DIAGNOSIS — F32A Depression, unspecified: Secondary | ICD-10-CM

## 2012-10-20 DIAGNOSIS — S161XXA Strain of muscle, fascia and tendon at neck level, initial encounter: Secondary | ICD-10-CM

## 2012-10-20 DIAGNOSIS — Z1231 Encounter for screening mammogram for malignant neoplasm of breast: Secondary | ICD-10-CM

## 2012-10-20 DIAGNOSIS — F3289 Other specified depressive episodes: Secondary | ICD-10-CM

## 2012-10-20 DIAGNOSIS — D509 Iron deficiency anemia, unspecified: Secondary | ICD-10-CM

## 2012-10-20 DIAGNOSIS — E7800 Pure hypercholesterolemia, unspecified: Secondary | ICD-10-CM | POA: Insufficient documentation

## 2012-10-20 DIAGNOSIS — E785 Hyperlipidemia, unspecified: Secondary | ICD-10-CM

## 2012-10-20 DIAGNOSIS — G4733 Obstructive sleep apnea (adult) (pediatric): Secondary | ICD-10-CM | POA: Insufficient documentation

## 2012-10-20 DIAGNOSIS — Z9989 Dependence on other enabling machines and devices: Secondary | ICD-10-CM

## 2012-10-20 LAB — CBC WITH DIFFERENTIAL/PLATELET
Eosinophils Absolute: 0.2 10*3/uL (ref 0.0–0.7)
Eosinophils Relative: 3 % (ref 0–5)
HCT: 36.1 % (ref 36.0–46.0)
Lymphocytes Relative: 34 % (ref 12–46)
Lymphs Abs: 3 10*3/uL (ref 0.7–4.0)
MCH: 25.7 pg — ABNORMAL LOW (ref 26.0–34.0)
MCV: 77.3 fL — ABNORMAL LOW (ref 78.0–100.0)
Monocytes Absolute: 0.6 10*3/uL (ref 0.1–1.0)
Monocytes Relative: 6 % (ref 3–12)
RBC: 4.67 MIL/uL (ref 3.87–5.11)
WBC: 8.7 10*3/uL (ref 4.0–10.5)

## 2012-10-20 LAB — LIPID PANEL
Cholesterol: 169 mg/dL (ref 0–200)
HDL: 47 mg/dL (ref >39)
Total CHOL/HDL Ratio: 3.6 Ratio
Triglycerides: 134 mg/dL (ref <150)

## 2012-10-20 LAB — COMPREHENSIVE METABOLIC PANEL
ALT: 37 U/L — ABNORMAL HIGH (ref 0–35)
BUN: 12 mg/dL (ref 6–23)
CO2: 25 mEq/L (ref 19–32)
Calcium: 8.8 mg/dL (ref 8.4–10.5)
Chloride: 103 mEq/L (ref 96–112)
Creat: 0.53 mg/dL (ref 0.50–1.10)
Glucose, Bld: 108 mg/dL — ABNORMAL HIGH (ref 70–99)
Total Bilirubin: 0.4 mg/dL (ref 0.3–1.2)

## 2012-10-20 MED ORDER — PANTOPRAZOLE SODIUM 40 MG PO TBEC: 40.0000 mg | DELAYED_RELEASE_TABLET | Freq: Every day | ORAL | Status: DC

## 2012-10-20 MED ORDER — BUPROPION HCL ER (SR) 100 MG PO TB12: 100.0000 mg | ORAL_TABLET | Freq: Two times a day (BID) | ORAL | Status: DC

## 2012-10-20 MED ORDER — ALPRAZOLAM 0.5 MG PO TABS: 0.5000 mg | ORAL_TABLET | Freq: Every evening | ORAL | Status: DC | PRN

## 2012-10-20 MED ORDER — FLUTICASONE PROPIONATE 50 MCG/ACT NA SUSP: 2.0000 | Freq: Every day | NASAL | Status: DC

## 2012-10-20 MED ORDER — SIMVASTATIN 40 MG PO TABS: 40.0000 mg | ORAL_TABLET | Freq: Every evening | ORAL | Status: DC

## 2012-10-20 MED ORDER — AZELASTINE HCL 0.1 % NA SOLN: 2.0000 | Freq: Two times a day (BID) | NASAL | Status: DC

## 2012-10-20 MED ORDER — SERTRALINE HCL 100 MG PO TABS: 100.0000 mg | ORAL_TABLET | Freq: Every day | ORAL | Status: DC

## 2012-10-20 MED ORDER — HYDROCORTISONE ACETATE 25 MG RE SUPP: 25.0000 mg | Freq: Two times a day (BID) | RECTAL | Status: DC | PRN

## 2012-10-20 MED ORDER — ZOLPIDEM TARTRATE 10 MG PO TABS: 10.0000 mg | ORAL_TABLET | Freq: Every evening | ORAL | Status: DC | PRN

## 2012-10-20 MED ORDER — METFORMIN HCL 500 MG PO TABS: 500.0000 mg | ORAL_TABLET | Freq: Two times a day (BID) | ORAL | Status: DC

## 2012-10-20 MED ORDER — VALACYCLOVIR HCL 500 MG PO TABS: 500.0000 mg | ORAL_TABLET | Freq: Two times a day (BID) | ORAL | Status: DC

## 2012-10-20 NOTE — Assessment & Plan Note (Signed)
Improved; repeat labs; asymptomatic.

## 2012-10-20 NOTE — Telephone Encounter (Signed)
Patient called about her protonix, it was written today for 1 qd, but she has been taking bid. I asked Dr Katrinka Blazing, and patient should try it once daily, if this does not help she is to call me back, and we can change back to bid dosing.

## 2012-10-20 NOTE — Assessment & Plan Note (Signed)
New.  Controlled with CPAP.  Increased energy with CPAP.

## 2012-10-20 NOTE — Assessment & Plan Note (Signed)
Improved.  Refills provided.  Agreeing to refills of medication and management of depression.

## 2012-10-20 NOTE — Patient Instructions (Addendum)
1. Type II or unspecified type diabetes mellitus without mention of complication, not stated as uncontrolled  Hemoglobin A1c  2. Pure hypercholesterolemia  Comprehensive metabolic panel, Lipid panel, CK  3. Iron deficiency anemia, unspecified  CBC with Differential  4. Depression    5. OSA on CPAP

## 2012-10-20 NOTE — Assessment & Plan Note (Signed)
Controlled; obtain labs; continue current medications. 

## 2012-10-20 NOTE — Assessment & Plan Note (Signed)
Controlled; obtain labs; continue current medication. Going for annual ophthalmology exam today.

## 2012-10-20 NOTE — Progress Notes (Signed)
Reviewed and agree.

## 2012-10-20 NOTE — Assessment & Plan Note (Signed)
Resolved

## 2012-10-20 NOTE — Progress Notes (Signed)
[address]   [place], [place]  [address]   [phone]  Subjective:    Patient ID: Cristina [name], female    DOB: [birth year], 51 y.o.   MRN: [id number]  HPIThis 51 y.o. female presents for three month follow-up for the following:    1.  DMII:  Three month follow-up; no changes to management made at last visit; reports compliance with Metformin, good tolerance to medication; good symptom control.   fasting sugar this morning 123; checks everyday. Highest reading 147.  Going for annual eye exam today.  Denies polyuria, polyphagia, paresthesias.    2. Hyperlipidemia:  Three month follow-up; no changes to management made at last visit.  Reports good compliance with treatment, good tolerance to treatment; good symptom control.Fasting.    Denies HA, dizziness, focal weakness.  Denies CP/palp/SOB/leg swelling.  3.  OSA: s/p sleep study; +OSA severe.   Sleeping better with CPAP machine; compliance with CPAP all night.  More energy in morning.  Nasal CPAP; will adjust during the night.    4.  Neck pain:  Completely better from last visit; will have intermittent tingling R posterior neck with excessive computer work.  5.  Candidiasis: improved; still has Diflucan to take.    6.  Anemia: improved at last visit.    7. Depression: still having some bad days but not a lot.  4-8 bad days per month.  Last psychiatry visit 07/2012.  Switched to [employer], Wellbutrin.  Cymbalta was causing excessive sweating.  Feels as good on this regimen as with Cymbalta.  Does not plan to return to psychiatry; requesting refills on medications.  Doing well.  8.  GERD: needs to switch from Nexium to Protonix per [employer].  Well controlled; no n/v/d/worsening constipation. IBS with constipation and diarrhea.     Review of Systems  Constitutional: Negative for chills, diaphoresis and fatigue.  Respiratory: Negative for shortness of breath.   Cardiovascular: Negative for chest pain, palpitations and leg swelling.    Gastrointestinal: Positive for diarrhea and constipation. Negative for nausea, vomiting, abdominal pain, blood in stool and abdominal distention.  Musculoskeletal: Negative for back pain and arthralgias.  Neurological: Negative for dizziness, tremors, syncope, facial asymmetry, speech difficulty, weakness, light-headedness, numbness and headaches.  Psychiatric/Behavioral: Positive for dysphoric mood. Negative for suicidal ideas, sleep disturbance and self-injury. The patient is not nervous/anxious.     Past Medical History  Diagnosis Date  . Hemorrhoid   . Fissure, anal   . Fibroids     uterine  . Cystocele   . Rectocele   . Diabetes mellitus   . Genital herpes   . Arthritis   . Depression   . Hyperlipidemia   . Obesity   . GERD (gastroesophageal reflux disease)   . CTS (carpal tunnel syndrome)   . EP (ectopic pregnancy) 1989  . Hx of migraine headaches   . Dysfunction of eustachian tube   . Constipation   . Tobacco use disorder   . Allergic rhinitis, cause unspecified   . Iron deficiency anemia, unspecified   . Insomnia     Past Surgical History  Procedure Date  . Cholecystectomy   . Carpal tunnel release 1989    Left  . Abdominal hysterectomy     per dr Katrinka Blazing records vaginal hysterectomy  05/2010  . Ectopic pregnancy surgery 1993  . Tubal ligation 1988  . Cholecystectomy     Prior to Admission medications   Medication Sig Start Date End Date Taking? Authorizing Provider  albuterol (PROAIR HFA) 108 (90 BASE) MCG/ACT inhaler Inhale 2 puffs into the lungs every 6 (six) hours as needed.   Yes Historical Provider, MD  ALPRAZolam Prudy Feeler) 0.5 MG tablet Take 1 tablet (0.5 mg total) by mouth at bedtime as needed. 10/20/12  Yes Ethelda Chick, MD  aspirin EC 81 MG tablet Take 81 mg by mouth daily.   Yes Historical Provider, MD  azelastine (ASTELIN) 137 MCG/SPRAY nasal spray Place 2 sprays into the nose 2 (two) times daily. Use in each nostril as directed 10/20/12  Yes  Ethelda Chick, MD  bifidobacterium infantis (ALIGN) capsule Take 1 capsule by mouth daily. 06/29/12 06/29/13 Yes Hilarie Fredrickson, MD  buPROPion [facility] SR) 100 MG 12 hr tablet Take 1 tablet (100 mg total) by mouth 2 (two) times daily. 10/20/12  Yes Ethelda Chick, MD  Calcium Carbonate-Vitamin D (CALCIUM + D PO) Take 1 tablet by mouth daily.   Yes Historical Provider, MD  Casanthranol-Docusate Sodium 30-100 MG CAPS Take by mouth. One capsule every other day.   Yes Historical Provider, MD  diltiazem 2 % GEL Apply 1 application topically 2 (two) times daily.   Yes Historical Provider, MD  esomeprazole (NEXIUM) 40 MG capsule Take 1 capsule (40 mg total) by mouth 2 (two) times daily. 06/15/12  Yes Beverley Fiedler, MD  ferrous sulfate 325 (65 FE) MG EC tablet Take 325 mg by mouth daily.    Yes Historical Provider, MD  fluticasone (FLONASE) 50 MCG/ACT nasal spray Place 2 sprays into the nose daily. 10/20/12  Yes Ethelda Chick, MD  Glucosamine HCl 1000 MG TABS Take 1,000 mg by mouth 3 (three) times daily.   Yes Historical Provider, MD  HYDROcodone-acetaminophen (VICODIN) 5-500 MG per tablet Take 1 tablet by mouth every 8 (eight) hours as needed for pain. 10/08/12  Yes Anders Simmonds, PA-C  hydrocortisone (ANUSOL-HC) 25 MG suppository Place 1 suppository (25 mg total) rectally 2 (two) times daily as needed for hemorrhoids. 10/20/12  Yes Ethelda Chick, MD  ibuprofen (ADVIL,MOTRIN) 600 MG tablet Take 600 mg by mouth every 6 (six) hours as needed.   Yes Historical Provider, MD  loratadine (CLARITIN) 10 MG tablet Take 10 mg by mouth daily.   Yes Historical Provider, MD  metFORMIN (GLUCOPHAGE) 500 MG tablet Take 1 tablet (500 mg total) by mouth 2 (two) times daily with a meal. Take 2 by mouth two times daily 10/20/12  Yes Ethelda Chick, MD  Multiple Vitamin (MULTIVITAMIN) capsule Take 1 capsule by mouth daily.   Yes Historical Provider, MD  senna (SENOKOT) 8.6 MG tablet Take 1 tablet by mouth daily.   Yes  Historical Provider, MD  sertraline (ZOLOFT) 100 MG tablet Take 1 tablet (100 mg total) by mouth daily. 10/20/12  Yes Ethelda Chick, MD  simvastatin (ZOCOR) 40 MG tablet Take 1 tablet (40 mg total) by mouth every evening. 10/20/12  Yes Ethelda Chick, MD  terconazole (TERAZOL 7) 0.4 % vaginal cream Place 1 applicator vaginally at bedtime. 10/14/12  Yes Ethelda Chick, MD  valACYclovir (VALTREX) 500 MG tablet Take 1 tablet (500 mg total) by mouth 2 (two) times daily. 10/20/12  Yes Ethelda Chick, MD  zolpidem (AMBIEN) 10 MG tablet Take 1 tablet (10 mg total) by mouth at bedtime as needed. 10/20/12  Yes Ethelda Chick, MD  amoxicillin-clavulanate (AUGMENTIN) 875-125 MG per tablet Take 1 tablet by mouth 2 (two) times daily. 09/29/12   Ethelda Chick, MD  BLACK COHOSH ROOT PO Take 450 mg by mouth 2 (two) times daily.    Historical Provider, MD  DULoxetine (CYMBALTA) 20 MG capsule Take 60 mg by mouth daily.     Historical Provider, MD  fluconazole (DIFLUCAN) 150 MG tablet Take 1 tablet (150 mg total) by mouth once. Repeat if needed 10/14/12   Ethelda Chick, MD  pantoprazole (PROTONIX) 40 MG tablet Take 1 tablet (40 mg total) by mouth daily. 10/20/12   Ethelda Chick, MD  predniSONE (DELTASONE) 20 MG tablet 2 tablets daily x 5 days, then 1 tablet daily x 5 days 09/29/12   Ethelda Chick, MD    Allergies  Allergen Reactions  . Fish Oil Rash    History   Social History  . Marital Status: Single    Spouse Name: N/A    Number of Children: 3  . Years of Education: college   Occupational History  . NURSE SECRETARY     [employer]   Social History Main Topics  . Smoking status: Former Games developer  . Smokeless tobacco: Never Used     Comment: smoked occasionally longest 6 mos.  . Alcohol Use: No     Comment: 2 x monthly drinks wine  . Drug Use: No  . Sexually Active: No   Other Topics Concern  . Not on file   Social History Narrative   Drinks coffee and soda's at least one a day. Divorced in 2000  after six years of marriage; not dating.Not sexually active since 2011. No guns in the home. Smoke detectors in use, Always uses seat belts. Exercise: Moderate: riding bicycle 1 day per week for 20 minutes.    Family History  Problem Relation Age of Onset  . Hypertension Mother 95  . Osteoarthritis Mother   . Colon cancer Mother   . Diabetes Mother   . Cancer Mother   . Colon cancer Paternal Grandmother   . Colon cancer      uncle/aunt  . Colon cancer Maternal Grandmother   . Colon polyps Brother   . Cirrhosis Brother     alcoholism        Objective:   Physical Exam  Nursing note and vitals reviewed. Constitutional: She is oriented to person, place, and time. She appears well-developed and well-nourished.  HENT:  Head: Normocephalic and atraumatic.  Eyes: Pupils are equal, round, and reactive to light.  Neck: Normal range of motion. Neck supple. No thyromegaly present.  Cardiovascular: Normal rate, regular rhythm, normal heart sounds and intact distal pulses.  Exam reveals no gallop and no friction rub.   No murmur heard. Pulmonary/Chest: Effort normal and breath sounds normal. She has no wheezes. She has no rales.  Abdominal: Soft. Bowel sounds are normal. She exhibits no distension and no mass. There is no tenderness. There is no rebound and no guarding.  Lymphadenopathy:    She has no cervical adenopathy.  Neurological: She is alert and oriented to person, place, and time. No cranial nerve deficit. She exhibits normal muscle tone. Coordination normal.  Psychiatric: She has a normal mood and affect. Her behavior is normal. Judgment and thought content normal.      Assessment & Plan:   1. Type II or unspecified type diabetes mellitus without mention of complication, not stated as uncontrolled  POCT glycosylated hemoglobin (Hb A1C)  2. Pure hypercholesterolemia  Comprehensive metabolic panel, Lipid panel, CK  3. Iron deficiency anemia, unspecified  CBC with Differential   4. Depression  5. OSA on CPAP

## 2012-10-23 NOTE — Addendum Note (Signed)
Addended by: Thelma Barge D on: 10/23/2012 02:51 PM   Modules accepted: Orders

## 2012-10-24 ENCOUNTER — Encounter: Payer: Self-pay | Admitting: Radiology

## 2012-11-07 ENCOUNTER — Telehealth: Payer: Self-pay

## 2012-11-07 NOTE — Telephone Encounter (Signed)
Patient is calling to let you know that the pill that was decreased is not working and is wanting to go back up to 2 a day.      Best#: [id number]

## 2012-11-09 MED ORDER — PANTOPRAZOLE SODIUM 40 MG PO TBEC: 40.0000 mg | DELAYED_RELEASE_TABLET | Freq: Two times a day (BID) | ORAL | Status: DC

## 2012-11-09 NOTE — Telephone Encounter (Signed)
See phone message from 10/20/12. Can we change Rx to Protonix BID instead of QD, or does this need to go back to Dr Katrinka Blazing?

## 2012-11-09 NOTE — Telephone Encounter (Signed)
Pt notified that Rx was sent in for BID and advised to let us know if she continues to have Sxs taking BID. Pt thanked [name] and agreed.

## 2012-11-09 NOTE — Telephone Encounter (Signed)
Please advise patient.  Meds ordered this encounter  Medications  . pantoprazole (PROTONIX) 40 MG tablet    Sig: Take 1 tablet (40 mg total) by mouth 2 (two) times daily.    Dispense:  180 tablet    Refill:  3    Order Specific Question:  Supervising Provider    Answer:  Tonye Pearson [[id number]]   Dr. Katrinka Blazing, [facility]

## 2012-11-11 ENCOUNTER — Other Ambulatory Visit: Payer: Self-pay | Admitting: *Deleted

## 2012-11-13 NOTE — Telephone Encounter (Signed)
Below noted and agree to continue Protonix at bid.  If persists, will warrant trial of Nexium or Dexilant. Will discuss further at next visit.

## 2012-11-18 ENCOUNTER — Ambulatory Visit: Payer: 59

## 2012-11-18 ENCOUNTER — Ambulatory Visit (INDEPENDENT_AMBULATORY_CARE_PROVIDER_SITE_OTHER): Payer: 59 | Admitting: Family Medicine

## 2012-11-18 ENCOUNTER — Telehealth: Payer: Self-pay | Admitting: Family Medicine

## 2012-11-18 VITALS — BP 127/86 | HR 96 | Temp 99.0°F | Resp 17 | Ht 61.5 in | Wt 226.0 lb

## 2012-11-18 DIAGNOSIS — M5430 Sciatica, unspecified side: Secondary | ICD-10-CM

## 2012-11-18 DIAGNOSIS — M549 Dorsalgia, unspecified: Secondary | ICD-10-CM

## 2012-11-18 MED ORDER — PREDNISONE 20 MG PO TABS: ORAL_TABLET | ORAL | Status: DC

## 2012-11-18 MED ORDER — HYDROCODONE-ACETAMINOPHEN 5-500 MG PO TABS: 1.0000 | ORAL_TABLET | Freq: Three times a day (TID) | ORAL | Status: DC | PRN

## 2012-11-18 NOTE — Progress Notes (Signed)
[facility] [address], [place] [address] [phone]  Date:  11/18/2012   Name:  Cristina [name]   DOB:  10-Feb-1961   MRN:  [id number]  PCP:  Nilda Simmer, MD    Chief Complaint: Back Pain   History of Present Illness:  Cristina [name] is a 52 y.o. very pleasant female patient who presents with the following:  She is here today with back pain.  She states that she has a herniated disc in her back; she has had an MRI which was done 15 years ago in [place].  Her back has bothered her "some" over the years but "not a whole lot."   Her back pain recurred about 3 weeks ago, worse for the last 3 days.  No unusual lifting or activities.   The pain is located in her left lower back.  The pain does run down her left leg.   She has not noted any numbness or weakness in her leg.  She had a partial hysterectomy and bladder tack a few years ago and has no had problems with urinary incontinence since then.    She has DM- A1c in December was 6.6.  Her glucose has been in the 120s generally.  She takes glucophage only.    Patient Active Problem List  Diagnosis  . UTERINE FIBROID  . OBESITY, NOS  . CONSTIPATION  . DM  . ANAL FISSURE  . RECTAL PAIN  . DYSPHAGIA UNSPECIFIED  . Other and unspecified hyperlipidemia  . Iron deficiency anemia, unspecified  . Neck strain  . Skin lesions, generalized  . Pure hypercholesterolemia  . OSA on CPAP  . Depression    Past Medical History  Diagnosis Date  . Hemorrhoid   . Fissure, anal   . Fibroids     uterine  . Cystocele   . Rectocele   . Diabetes mellitus   . Genital herpes   . Arthritis   . Depression   . Hyperlipidemia   . Obesity   . GERD (gastroesophageal reflux disease)   . CTS (carpal tunnel syndrome)   . EP (ectopic pregnancy) 1989  . Hx of migraine headaches   . Dysfunction of eustachian tube   . Constipation   . Tobacco use disorder   . Allergic rhinitis, cause unspecified   . Iron deficiency  anemia, unspecified   . Insomnia     Past Surgical History  Procedure Date  . Cholecystectomy   . Carpal tunnel release 1989    Left  . Abdominal hysterectomy     per dr Katrinka Blazing records vaginal hysterectomy  05/2010  . Ectopic pregnancy surgery 1993  . Tubal ligation 1988  . Cholecystectomy   . Sleep study 09/11/2012    severe OSA.  CPAP titration at 12 cm water pressure.    History  Substance Use Topics  . Smoking status: Former Games developer  . Smokeless tobacco: Never Used     Comment: smoked occasionally longest 6 mos.  . Alcohol Use: 0.6 oz/week    1 Glasses of wine per week     Comment: 2 x monthly drinks wine    Family History  Problem Relation Age of Onset  . Hypertension Mother 29  . Osteoarthritis Mother   . Colon cancer Mother   . Diabetes Mother   . Cancer Mother   . Colon cancer Paternal Grandmother   . Cancer Paternal Grandmother   . Colon cancer      uncle/aunt  .  Colon cancer Maternal Grandmother   . Stroke Maternal Grandmother   . Colon polyps Brother   . Cirrhosis Brother     alcoholism  . Cancer Maternal Grandfather     Allergies  Allergen Reactions  . Fish Oil Rash    Medication list has been reviewed and updated.  Current Outpatient Prescriptions on File Prior to Visit  Medication Sig Dispense Refill  . albuterol (PROAIR HFA) 108 (90 BASE) MCG/ACT inhaler Inhale 2 puffs into the lungs every 6 (six) hours as needed.      . ALPRAZolam (XANAX) 0.5 MG tablet Take 1 tablet (0.5 mg total) by mouth at bedtime as needed.  30 tablet  0  . aspirin EC 81 MG tablet Take 81 mg by mouth daily.      [name] azelastine (ASTELIN) 137 MCG/SPRAY nasal spray Place 2 sprays into the nose 2 (two) times daily. Use in each nostril as directed  30 mL  6  . bifidobacterium infantis (ALIGN) capsule Take 1 capsule by mouth daily.  28 capsule  0  . buPROPion (WELLBUTRIN SR) 100 MG 12 hr tablet Take 1 tablet (100 mg total) by mouth 2 (two) times daily.  180 tablet  3  . Calcium  Carbonate-Vitamin D (CALCIUM + D PO) Take 1 tablet by mouth daily.      Jennette Banker Sodium 30-100 MG CAPS Take by mouth. One capsule every other day.      . diltiazem 2 % GEL Apply 1 application topically 2 (two) times daily.      . ferrous sulfate 325 (65 FE) MG EC tablet Take 325 mg by mouth daily.       . fluconazole (DIFLUCAN) 150 MG tablet Take 1 tablet (150 mg total) by mouth once. Repeat if needed  2 tablet  0  . fluticasone (FLONASE) 50 MCG/ACT nasal spray Place 2 sprays into the nose daily.  16 g  11  . Glucosamine HCl 1000 MG TABS Take 1,000 mg by mouth 3 (three) times daily.      . hydrocortisone (ANUSOL-HC) 25 MG suppository Place 1 suppository (25 mg total) rectally 2 (two) times daily as needed for hemorrhoids.  12 suppository  5  . ibuprofen (ADVIL,MOTRIN) 600 MG tablet Take 600 mg by mouth every 6 (six) hours as needed.      . loratadine (CLARITIN) 10 MG tablet Take 10 mg by mouth daily.      . metFORMIN (GLUCOPHAGE) 500 MG tablet Take 500 mg by mouth 2 (two) times daily with a meal.      . Multiple Vitamin (MULTIVITAMIN) capsule Take 1 capsule by mouth daily.      . pantoprazole (PROTONIX) 40 MG tablet Take 1 tablet (40 mg total) by mouth 2 (two) times daily.  180 tablet  3  . sertraline (ZOLOFT) 100 MG tablet Take 1 tablet (100 mg total) by mouth daily.  90 tablet  3  . simvastatin (ZOCOR) 40 MG tablet Take 1 tablet (40 mg total) by mouth every evening.  90 tablet  3  . valACYclovir (VALTREX) 500 MG tablet Take 1 tablet (500 mg total) by mouth 2 (two) times daily.  180 tablet  3  . zolpidem (AMBIEN) 10 MG tablet Take 1 tablet (10 mg total) by mouth at bedtime as needed.  30 tablet  5  . amoxicillin-clavulanate (AUGMENTIN) 875-125 MG per tablet Take 1 tablet by mouth 2 (two) times daily.  20 tablet  0  . BLACK COHOSH ROOT PO Take 450  mg by mouth 2 (two) times daily.      . DULoxetine (CYMBALTA) 20 MG capsule Take 60 mg by mouth daily.       [name] esomeprazole (NEXIUM) 40  MG capsule Take 1 capsule (40 mg total) by mouth 2 (two) times daily.  60 capsule  3  . HYDROcodone-acetaminophen (VICODIN) 5-500 MG per tablet Take 1 tablet by mouth every 8 (eight) hours as needed for pain.  20 tablet  0  . predniSONE (DELTASONE) 20 MG tablet 2 tablets daily x 5 days, then 1 tablet daily x 5 days  15 tablet  0  . senna (SENOKOT) 8.6 MG tablet Take 1 tablet by mouth daily.      [name] terconazole (TERAZOL 7) 0.4 % vaginal cream Place 1 applicator vaginally at bedtime.  45 g  0    Review of Systems:  As per HPI- otherwise negative.   Physical Examination: Filed Vitals:   11/18/12 1119  BP: 127/86  Pulse: 96  Temp: 99 F (37.2 C)  Resp: 17   Filed Vitals:   11/18/12 1119  Height: 5' 1.5" (1.562 m)  Weight: 226 lb (102.513 kg)   Body mass index is 42.01 kg/(m^2). Ideal Body Weight: Weight in (lb) to have BMI = 25: 134.2   GEN: WDWN, NAD, Non-toxic, A & O x 3, obese HEENT: Atraumatic, Normocephalic. Neck supple. No masses, No LAD. Ears and Nose: No external deformity. CV: RRR, No M/G/R. No JVD. No thrill. No extra heart sounds. PULM: CTA B, no wheezes, crackles, rhonchi. No retractions. No resp. distress. No accessory muscle use. EXTR: No c/c/e NEURO Normal gait.  PSYCH: Normally interactive. Conversant. Not depressed or anxious appearing.  Calm demeanor.  Back: mild tenderness over the left buttock/ left sciatic notch.  Good flexion and extension.  Normal strength, sensation and patellar DTR bilaterally, no saddle anesthesia.  SLR is slightly positive on the left   [facility] reading (PRIMARY) by  Dr. Patsy Lager. Lumbar spine:  Slight degenerative change, more severe degeneration in the thoracic area.    LUMBAR SPINE - COMPLETE 4+ VIEW  Comparison: None.  Findings: Vascular clips in the right upper abdomen. Bilateral pelvic phleboliths. Normal alignment. Small anterior endplate spurs in the visualized lower thoracic spine and L1-2. Negative for fracture. Disc height  well maintained throughout.  IMPRESSION:  1. Negative for fracture or other acute bony abnormality. 2. Small end plate spurs.   Assessment and Plan: 1. Back pain  DG Lumbar Spine Complete, predniSONE (DELTASONE) 20 MG tablet, HYDROcodone-acetaminophen (VICODIN) 5-500 MG per tablet  2. Sciatica  predniSONE (DELTASONE) 20 MG tablet   Treat as above- she has tolerated a course of prednisone not long ago, but advised her to keep an eye on her glucose.  She will let us know if not better in the next couple of days- Sooner if worse.   Meds ordered this encounter  Medications  . predniSONE (DELTASONE) 20 MG tablet    Sig: 2 tablets daily x 3 days, then 1 tablet daily x 3 days    Dispense:  9 tablet    Refill:  0  . HYDROcodone-acetaminophen (VICODIN) 5-500 MG per tablet    Sig: Take 1 tablet by mouth every 8 (eight) hours as needed for pain.    Dispense:  20 tablet    Refill:  0    Order Specific Question:  Supervising Provider    Answer:  Merla Riches, ROBERT P [[id number]]   Abbe Amsterdam, MD

## 2012-11-18 NOTE — Patient Instructions (Addendum)
I think that you are sciatica, or pain from the nerve running down your left leg.  We are going to try a short course of prednisone and pain medication as needed. Please let me know if you are not better in the next few days- Sooner if worse.

## 2012-11-18 NOTE — Telephone Encounter (Signed)
Called Pharmacy twice to try and talk to a LIVE person- no luck. LMOM for Vicodin 5-500mg  Q 8 hours prn for pain. #20  0 refills.  Eileen Stanford

## 2012-11-23 ENCOUNTER — Ambulatory Visit (INDEPENDENT_AMBULATORY_CARE_PROVIDER_SITE_OTHER): Payer: 59 | Admitting: Family Medicine

## 2012-11-23 ENCOUNTER — Encounter: Payer: Self-pay | Admitting: Family Medicine

## 2012-11-23 VITALS — BP 130/90 | HR 70 | Temp 98.2°F | Resp 16 | Ht 62.0 in | Wt 229.0 lb

## 2012-11-23 DIAGNOSIS — M549 Dorsalgia, unspecified: Secondary | ICD-10-CM

## 2012-11-23 DIAGNOSIS — M5432 Sciatica, left side: Secondary | ICD-10-CM

## 2012-11-23 DIAGNOSIS — M5430 Sciatica, unspecified side: Secondary | ICD-10-CM

## 2012-11-23 DIAGNOSIS — G2581 Restless legs syndrome: Secondary | ICD-10-CM

## 2012-11-23 DIAGNOSIS — D509 Iron deficiency anemia, unspecified: Secondary | ICD-10-CM

## 2012-11-23 LAB — CBC
Platelets: 380 10*3/uL (ref 150–400)
RBC: 4.41 MIL/uL (ref 3.87–5.11)
WBC: 13.9 10*3/uL — ABNORMAL HIGH (ref 4.0–10.5)

## 2012-11-23 LAB — IRON: Iron: 46 ug/dL (ref 42–145)

## 2012-11-23 MED ORDER — ROPINIROLE HCL 0.25 MG PO TABS: 0.2500 mg | ORAL_TABLET | Freq: Every day | ORAL | Status: DC

## 2012-11-23 MED ORDER — HYDROCODONE-ACETAMINOPHEN 5-500 MG PO TABS: 1.0000 | ORAL_TABLET | Freq: Three times a day (TID) | ORAL | Status: DC | PRN

## 2012-11-23 MED ORDER — PREDNISONE 20 MG PO TABS: ORAL_TABLET | ORAL | Status: DC

## 2012-11-23 MED ORDER — CYCLOBENZAPRINE HCL 5 MG PO TABS: ORAL_TABLET | ORAL | Status: DC

## 2012-11-23 NOTE — Patient Instructions (Addendum)
1. Sciatica of left side    2. Restless leg syndrome    3. Iron deficiency anemia, unspecified  CBC, Ferritin, Iron, IBC Panel  4. Back pain  predniSONE (DELTASONE) 20 MG tablet, HYDROcodone-acetaminophen (VICODIN) 5-500 MG per tablet  5. Sciatica  predniSONE (DELTASONE) 20 MG tablet, Ambulatory referral to Sports Medicine   Sciatica with Rehab The sciatic nerve runs from the back down the leg and is responsible for sensation and control of the muscles in the back (posterior) side of the thigh, lower leg, and foot. Sciatica is a condition that is characterized by inflammation of this nerve.  SYMPTOMS   Signs of nerve damage, including numbness and/or weakness along the posterior side of the lower extremity.  Pain in the back of the thigh that may also travel down the leg.  Pain that worsens when sitting for long periods of time.  Occasionally, pain in the back or buttock. CAUSES  Inflammation of the sciatic nerve is the cause of sciatica. The inflammation is due to something irritating the nerve. Common sources of irritation include:  Sitting for long periods of time.  Direct trauma to the nerve.  Arthritis of the spine.  Herniated or ruptured disk.  Slipping of the vertebrae (spondylolithesis)  Pressure from soft tissues, such as muscles or ligament-like tissue (fascia). RISK INCREASES WITH:  Sports that place pressure or stress on the spine (football or weightlifting).  Poor strength and flexibility.  Failure to warm-up properly before activity.  Family history of low back pain or disk disorders.  Previous back injury or surgery.  Poor body mechanics, especially when lifting, or poor posture. PREVENTION   Warm up and stretch properly before activity.  Maintain physical fitness:  Strength, flexibility, and endurance.  Cardiovascular fitness.  Learn and use proper technique, especially with posture and lifting. When possible, have coach correct improper  technique.  Avoid activities that place stress on the spine. PROGNOSIS If treated properly, then sciatica usually resolves within 6 weeks. However, occasionally surgery is necessary.  RELATED COMPLICATIONS   Permanent nerve damage, including pain, numbness, tingle, or weakness.  Chronic back pain.  Risks of surgery: infection, bleeding, nerve damage, or damage to surrounding tissues. TREATMENT Treatment initially involves resting from any activities that aggravate your symptoms. The use of ice and medication may help reduce pain and inflammation. The use of strengthening and stretching exercises may help reduce pain with activity. These exercises may be performed at home or with referral to a therapist. A therapist may recommend further treatments, such as transcutaneous electronic nerve stimulation (TENS) or ultrasound. Your caregiver may recommend corticosteroid injections to help reduce inflammation of the sciatic nerve. If symptoms persist despite non-surgical (conservative) treatment, then surgery may be recommended. MEDICATION  If pain medication is necessary, then nonsteroidal anti-inflammatory medications, such as aspirin and ibuprofen, or other minor pain relievers, such as acetaminophen, are often recommended.  Do not take pain medication for 7 days before surgery.  Prescription pain relievers may be given if deemed necessary by your caregiver. Use only as directed and only as much as you need.  Ointments applied to the skin may be helpful.  Corticosteroid injections may be given by your caregiver. These injections should be reserved for the most serious cases, because they may only be given a certain number of times. HEAT AND COLD  Cold treatment (icing) relieves pain and reduces inflammation. Cold treatment should be applied for 10 to 15 minutes every 2 to 3 hours for inflammation and pain  and immediately after any activity that aggravates your symptoms. Use ice packs or  massage the area with a piece of ice (ice massage).  Heat treatment may be used prior to performing the stretching and strengthening activities prescribed by your caregiver, physical therapist, or athletic trainer. Use a heat pack or soak the injury in warm water. SEEK MEDICAL CARE IF:  Treatment seems to offer no benefit, or the condition worsens.  Any medications produce adverse side effects. EXERCISES  RANGE OF MOTION (ROM) AND STRETCHING EXERCISES - Sciatica Most people with sciatic will find that their symptoms worsen with either excessive bending forward (flexion) or arching at the low back (extension). The exercises which will help resolve your symptoms will focus on the opposite motion. Your physician, physical therapist or athletic trainer will help you determine which exercises will be most helpful to resolve your low back pain. Do not complete any exercises without first consulting with your clinician. Discontinue any exercises which worsen your symptoms until you speak to your clinician. If you have pain, numbness or tingling which travels down into your buttocks, leg or foot, the goal of the therapy is for these symptoms to move closer to your back and eventually resolve. Occasionally, these leg symptoms will get better, but your low back pain may worsen; this is typically an indication of progress in your rehabilitation. Be certain to be very alert to any changes in your symptoms and the activities in which you participated in the 24 hours prior to the change. Sharing this information with your clinician will allow him/her to most efficiently treat your condition. These exercises may help you when beginning to rehabilitate your injury. Your symptoms may resolve with or without further involvement from your physician, physical therapist or athletic trainer. While completing these exercises, remember:   Restoring tissue flexibility helps normal motion to return to the joints. This allows  healthier, less painful movement and activity.  An effective stretch should be held for at least 30 seconds.  A stretch should never be painful. You should only feel a gentle lengthening or release in the stretched tissue. FLEXION RANGE OF MOTION AND STRETCHING EXERCISES: STRETCH  Flexion, Single Knee to Chest   Lie on a firm bed or floor with both legs extended in front of you.  Keeping one leg in contact with the floor, bring your opposite knee to your chest. Hold your leg in place by either grabbing behind your thigh or at your knee.  Pull until you feel a gentle stretch in your low back. Hold __________ seconds.  Slowly release your grasp and repeat the exercise with the opposite side. Repeat __________ times. Complete this exercise __________ times per day.  STRETCH  Flexion, Double Knee to Chest  Lie on a firm bed or floor with both legs extended in front of you.  Keeping one leg in contact with the floor, bring your opposite knee to your chest.  Tense your stomach muscles to support your back and then lift your other knee to your chest. Hold your legs in place by either grabbing behind your thighs or at your knees.  Pull both knees toward your chest until you feel a gentle stretch in your low back. Hold __________ seconds.  Tense your stomach muscles and slowly return one leg at a time to the floor. Repeat __________ times. Complete this exercise __________ times per day.  STRETCH  Low Trunk Rotation   Lie on a firm bed or floor. Keeping your legs in  front of you, bend your knees so they are both pointed toward the ceiling and your feet are flat on the floor.  Extend your arms out to the side. This will stabilize your upper body by keeping your shoulders in contact with the floor.  Gently and slowly drop both knees together to one side until you feel a gentle stretch in your low back. Hold for __________ seconds.  Tense your stomach muscles to support your low back as you  bring your knees back to the starting position. Repeat the exercise to the other side. Repeat __________ times. Complete this exercise __________ times per day  EXTENSION RANGE OF MOTION AND FLEXIBILITY EXERCISES: STRETCH  Extension, Prone on Elbows  Lie on your stomach on the floor, a bed will be too soft. Place your palms about shoulder width apart and at the height of your head.  Place your elbows under your shoulders. If this is too painful, stack pillows under your chest.  Allow your body to relax so that your hips drop lower and make contact more completely with the floor.  Hold this position for __________ seconds.  Slowly return to lying flat on the floor. Repeat __________ times. Complete this exercise __________ times per day.  RANGE OF MOTION  Extension, Prone Press Ups  Lie on your stomach on the floor, a bed will be too soft. Place your palms about shoulder width apart and at the height of your head.  Keeping your back as relaxed as possible, slowly straighten your elbows while keeping your hips on the floor. You may adjust the placement of your hands to maximize your comfort. As you gain motion, your hands will come more underneath your shoulders.  Hold this position __________ seconds.  Slowly return to lying flat on the floor. Repeat __________ times. Complete this exercise __________ times per day.  STRENGTHENING EXERCISES - Sciatica  These exercises may help you when beginning to rehabilitate your injury. These exercises should be done near your "sweet spot." This is the neutral, low-back arch, somewhere between fully rounded and fully arched, that is your least painful position. When performed in this safe range of motion, these exercises can be used for people who have either a flexion or extension based injury. These exercises may resolve your symptoms with or without further involvement from your physician, physical therapist or athletic trainer. While completing these  exercises, remember:   Muscles can gain both the endurance and the strength needed for everyday activities through controlled exercises.  Complete these exercises as instructed by your physician, physical therapist or athletic trainer. Progress with the resistance and repetition exercises only as your caregiver advises.  You may experience muscle soreness or fatigue, but the pain or discomfort you are trying to eliminate should never worsen during these exercises. If this pain does worsen, stop and make certain you are following the directions exactly. If the pain is still present after adjustments, discontinue the exercise until you can discuss the trouble with your clinician. STRENGTHENING Deep Abdominals, Pelvic Tilt   Lie on a firm bed or floor. Keeping your legs in front of you, bend your knees so they are both pointed toward the ceiling and your feet are flat on the floor.  Tense your lower abdominal muscles to press your low back into the floor. This motion will rotate your pelvis so that your tail bone is scooping upwards rather than pointing at your feet or into the floor.  With a gentle tension and even  breathing, hold this position for __________ seconds. Repeat __________ times. Complete this exercise __________ times per day.  STRENGTHENING  Abdominals, Crunches   Lie on a firm bed or floor. Keeping your legs in front of you, bend your knees so they are both pointed toward the ceiling and your feet are flat on the floor. Cross your arms over your chest.  Slightly tip your chin down without bending your neck.  Tense your abdominals and slowly lift your trunk high enough to just clear your shoulder blades. Lifting higher can put excessive stress on the low back and does not further strengthen your abdominal muscles.  Control your return to the starting position. Repeat __________ times. Complete this exercise __________ times per day.  STRENGTHENING  Quadruped, Opposite UE/LE  Lift  Assume a hands and knees position on a firm surface. Keep your hands under your shoulders and your knees under your hips. You may place padding under your knees for comfort.  Find your neutral spine and gently tense your abdominal muscles so that you can maintain this position. Your shoulders and hips should form a rectangle that is parallel with the floor and is not twisted.  Keeping your trunk steady, lift your right hand no higher than your shoulder and then your left leg no higher than your hip. Make sure you are not holding your breath. Hold this position __________ seconds.  Continuing to keep your abdominal muscles tense and your back steady, slowly return to your starting position. Repeat with the opposite arm and leg. Repeat __________ times. Complete this exercise __________ times per day.  STRENGTHENING  Abdominals and Quadriceps, Straight Leg Raise   Lie on a firm bed or floor with both legs extended in front of you.  Keeping one leg in contact with the floor, bend the other knee so that your foot can rest flat on the floor.  Find your neutral spine, and tense your abdominal muscles to maintain your spinal position throughout the exercise.  Slowly lift your straight leg off the floor about 6 inches for a count of 15, making sure to not hold your breath.  Still keeping your neutral spine, slowly lower your leg all the way to the floor. Repeat this exercise with each leg __________ times. Complete this exercise __________ times per day. POSTURE AND BODY MECHANICS CONSIDERATIONS - Sciatica Keeping correct posture when sitting, standing or completing your activities will reduce the stress put on different body tissues, allowing injured tissues a chance to heal and limiting painful experiences. The following are general guidelines for improved posture. Your physician or physical therapist will provide you with any instructions specific to your needs. While reading these  guidelines, remember:  The exercises prescribed by your provider will help you have the flexibility and strength to maintain correct postures.  The correct posture provides the optimal environment for your joints to work. All of your joints have less wear and tear when properly supported by a spine with good posture. This means you will experience a healthier, less painful body.  Correct posture must be practiced with all of your activities, especially prolonged sitting and standing. Correct posture is as important when doing repetitive low-stress activities (typing) as it is when doing a single heavy-load activity (lifting). RESTING POSITIONS Consider which positions are most painful for you when choosing a resting position. If you have pain with flexion-based activities (sitting, bending, stooping, squatting), choose a position that allows you to rest in a less flexed posture. You would  want to avoid curling into a fetal position on your side. If your pain worsens with extension-based activities (prolonged standing, working overhead), avoid resting in an extended position such as sleeping on your stomach. Most people will find more comfort when they rest with their spine in a more neutral position, neither too rounded nor too arched. Lying on a non-sagging bed on your side with a pillow between your knees, or on your back with a pillow under your knees will often provide some relief. Keep in mind, being in any one position for a prolonged period of time, no matter how correct your posture, can still lead to stiffness. PROPER SITTING POSTURE In order to minimize stress and discomfort on your spine, you must sit with correct posture Sitting with good posture should be effortless for a healthy body. Returning to good posture is a gradual process. Many people can work toward this most comfortably by using various supports until they have the flexibility and strength to maintain this posture on their  own. When sitting with proper posture, your ears will fall over your shoulders and your shoulders will fall over your hips. You should use the back of the chair to support your upper back. Your low back will be in a neutral position, just slightly arched. You may place a small pillow or folded towel at the base of your low back for support.  When working at a desk, create an environment that supports good, upright posture. Without extra support, muscles fatigue and lead to excessive strain on joints and other tissues. Keep these recommendations in mind: CHAIR:   A chair should be able to slide under your desk when your back makes contact with the back of the chair. This allows you to work closely.  The chair's height should allow your eyes to be level with the upper part of your monitor and your hands to be slightly lower than your elbows. BODY POSITION  Your feet should make contact with the floor. If this is not possible, use a foot rest.  Keep your ears over your shoulders. This will reduce stress on your neck and low back. INCORRECT SITTING POSTURES   If you are feeling tired and unable to assume a healthy sitting posture, do not slouch or slump. This puts excessive strain on your back tissues, causing more damage and pain. Healthier options include:  Using more support, like a lumbar pillow.  Switching tasks to something that requires you to be upright or walking.  Talking a brief walk.  Lying down to rest in a neutral-spine position. PROLONGED STANDING WHILE SLIGHTLY LEANING FORWARD  When completing a task that requires you to lean forward while standing in one place for a long time, place either foot up on a stationary 2-4 inch high object to help maintain the best posture. When both feet are on the ground, the low back tends to lose its slight inward curve. If this curve flattens (or becomes too large), then the back and your other joints will experience too much stress, fatigue more  quickly and can cause pain.  CORRECT STANDING POSTURES Proper standing posture should be assumed with all daily activities, even if they only take a few moments, like when brushing your teeth. As in sitting, your ears should fall over your shoulders and your shoulders should fall over your hips. You should keep a slight tension in your abdominal muscles to brace your spine. Your tailbone should point down to the ground, not behind your  body, resulting in an over-extended swayback posture.  INCORRECT STANDING POSTURES  Common incorrect standing postures include a forward head, locked knees and/or an excessive swayback. WALKING Walk with an upright posture. Your ears, shoulders and hips should all line-up. PROLONGED ACTIVITY IN A FLEXED POSITION When completing a task that requires you to bend forward at your waist or lean over a low surface, try to find a way to stabilize 3 of 4 of your limbs. You can place a hand or elbow on your thigh or rest a knee on the surface you are reaching across. This will provide you more stability so that your muscles do not fatigue as quickly. By keeping your knees relaxed, or slightly bent, you will also reduce stress across your low back. CORRECT LIFTING TECHNIQUES DO :   Assume a wide stance. This will provide you more stability and the opportunity to get as close as possible to the object which you are lifting.  Tense your abdominals to brace your spine; then bend at the knees and hips. Keeping your back locked in a neutral-spine position, lift using your leg muscles. Lift with your legs, keeping your back straight.  Test the weight of unknown objects before attempting to lift them.  Try to keep your elbows locked down at your sides in order get the best strength from your shoulders when carrying an object.  Always ask for help when lifting heavy or awkward objects. INCORRECT LIFTING TECHNIQUES DO NOT:   Lock your knees when lifting, even if it is a small  object.  Bend and twist. Pivot at your feet or move your feet when needing to change directions.  Assume that you cannot safely pick up a paperclip without proper posture. Document Released: 10/28/2005 Document Revised: 01/20/2012 Document Reviewed: 02/09/2009 [facility] Patient Information 2013 [employer].

## 2012-11-23 NOTE — Progress Notes (Signed)
[address]   [place], [place]  [address]   [phone]  Subjective:    Patient ID: Cristina [name], female    DOB: February 06, 1961, 52 y.o.   MRN: [id number]  HPIThis 52 y.o. female presents for evaluation of low back pain.  S/p evaluation by Dr. Patsy Lager last week for back pain.  Rx for Prednisone, Vicodin.   Pain has worsened since last visit.  Severity 6/10.  L sided lower back pain; radiates into leg L.  Hurts on lateral aspect of leg.  +n/t lateral leg.  +weakness yesterday intermittent.  Normal b/b function.  No saddle paresthesias.  Completed Prednisone taper.  Vicodin working but does not want to take narcotics regularly.  Sugars running 95 fasting.  Taking hydrocodone bid.  S/p xrays of lumbar spine last week with Dr. Patsy Lager.  L lower pelvic/abdominal pain seems to worsen as L lower back pain worsens; pain from L lower back region radiates down L lateral leg and into L lateral side and abdomen.  No n/v/d/c.  No bloody stools or black stools.  No associated urinary urgency, frequency, dysuria, hematuria.  2.  RLS: worsening; now bothering patient at night when going to sleep; requesting medication.  Taking ferrous sulfate 325mg  one daily.  History of iron deficiency anemia.    3. OSA: compliance with CPAP; recent compliance report reviewed; using CPAP nightly for 9 hours per night on average.     Past Medical History  Diagnosis Date  . Hemorrhoid   . Fissure, anal   . Fibroids     uterine  . Cystocele   . Rectocele   . Diabetes mellitus   . Genital herpes   . Arthritis   . Depression   . Hyperlipidemia   . Obesity   . GERD (gastroesophageal reflux disease)   . CTS (carpal tunnel syndrome)   . EP (ectopic pregnancy) 1989  . Hx of migraine headaches   . Dysfunction of eustachian tube   . Constipation   . Tobacco use disorder   . Allergic rhinitis, cause unspecified   . Iron deficiency anemia, unspecified   . Insomnia     Past Surgical History  Procedure Date  .  Cholecystectomy   . Carpal tunnel release 1989    Left  . Abdominal hysterectomy     per dr Katrinka Blazing records vaginal hysterectomy  05/2010  . Ectopic pregnancy surgery 1993  . Tubal ligation 1988  . Cholecystectomy   . Sleep study 09/11/2012    severe OSA.  CPAP titration at 12 cm water pressure.    Prior to Admission medications   Medication Sig Start Date End Date Taking? Authorizing Provider  albuterol (PROAIR HFA) 108 (90 BASE) MCG/ACT inhaler Inhale 2 puffs into the lungs every 6 (six) hours as needed.   Yes Historical Provider, MD  ALPRAZolam Prudy Feeler) 0.5 MG tablet Take 1 tablet (0.5 mg total) by mouth at bedtime as needed. 10/20/12  Yes Ethelda Chick, MD  aspirin EC 81 MG tablet Take 81 mg by mouth daily.   Yes Historical Provider, MD  azelastine (ASTELIN) 137 MCG/SPRAY nasal spray Place 2 sprays into the nose 2 (two) times daily. Use in each nostril as directed 10/20/12  Yes Ethelda Chick, MD  bifidobacterium infantis (ALIGN) capsule Take 1 capsule by mouth daily. 06/29/12 06/29/13 Yes Hilarie Fredrickson, MD  buPROPion [facility] SR) 100 MG 12 hr tablet Take 1 tablet (100 mg total) by mouth 2 (two) times daily. 10/20/12  Yes  Ethelda Chick, MD  Calcium Carbonate-Vitamin D (CALCIUM + D PO) Take 1 tablet by mouth daily.   Yes Historical Provider, MD  ferrous sulfate 325 (65 FE) MG EC tablet Take 325 mg by mouth daily.    Yes Historical Provider, MD  fluticasone (FLONASE) 50 MCG/ACT nasal spray Place 2 sprays into the nose daily. 10/20/12  Yes Ethelda Chick, MD  Glucosamine HCl 1000 MG TABS Take 1,000 mg by mouth 3 (three) times daily.   Yes Historical Provider, MD  HYDROcodone-acetaminophen (VICODIN) 5-500 MG per tablet Take 1 tablet by mouth every 8 (eight) hours as needed for pain. 11/18/12  Yes Gwenlyn Found Copland, MD  hydrocortisone (ANUSOL-HC) 25 MG suppository Place 1 suppository (25 mg total) rectally 2 (two) times daily as needed for hemorrhoids. 10/20/12  Yes Ethelda Chick, MD  ibuprofen  (ADVIL,MOTRIN) 600 MG tablet Take 600 mg by mouth every 6 (six) hours as needed.   Yes Historical Provider, MD  loratadine (CLARITIN) 10 MG tablet Take 10 mg by mouth daily.   Yes Historical Provider, MD  metFORMIN (GLUCOPHAGE) 500 MG tablet Take 500 mg by mouth 2 (two) times daily with a meal. 10/20/12  Yes Ethelda Chick, MD  Multiple Vitamin (MULTIVITAMIN) capsule Take 1 capsule by mouth daily.   Yes Historical Provider, MD  pantoprazole (PROTONIX) 40 MG tablet Take 1 tablet (40 mg total) by mouth 2 (two) times daily. 11/09/12  Yes Chelle S Jeffery, PA-C  senna (SENOKOT) 8.6 MG tablet Take 1 tablet by mouth daily.   Yes Historical Provider, MD  sertraline (ZOLOFT) 100 MG tablet Take 1 tablet (100 mg total) by mouth daily. 10/20/12  Yes Ethelda Chick, MD  simvastatin (ZOCOR) 40 MG tablet Take 1 tablet (40 mg total) by mouth every evening. 10/20/12  Yes Ethelda Chick, MD  valACYclovir (VALTREX) 500 MG tablet Take 1 tablet (500 mg total) by mouth 2 (two) times daily. 10/20/12  Yes Ethelda Chick, MD  zolpidem (AMBIEN) 10 MG tablet Take 1 tablet (10 mg total) by mouth at bedtime as needed. 10/20/12  Yes Ethelda Chick, MD  BLACK COHOSH ROOT PO Take 450 mg by mouth 2 (two) times daily.    Historical Provider, MD  Casanthranol-Docusate Sodium 30-100 MG CAPS Take by mouth. One capsule every other day.    Historical Provider, MD  predniSONE (DELTASONE) 20 MG tablet 2 tablets daily x 3 days, then 1 tablet daily x 3 days 11/18/12   Pearline Cables, MD  terconazole (TERAZOL 7) 0.4 % vaginal cream Place 1 applicator vaginally at bedtime. 10/14/12   Ethelda Chick, MD    Allergies  Allergen Reactions  . Fish Oil Rash    History   Social History  . Marital Status: Single    Spouse Name: N/A    Number of Children: 3  . Years of Education: college   Occupational History  . NURSE SECRETARY     [employer]   Social History Main Topics  . Smoking status: Former Games developer  . Smokeless tobacco: Never Used      Comment: smoked occasionally longest 6 mos.  . Alcohol Use: 0.6 oz/week    1 Glasses of wine per week     Comment: 2 x monthly drinks wine  . Drug Use: No  . Sexually Active: No   Other Topics Concern  . Not on file   Social History Narrative   Drinks coffee and soda's at least one a day. Divorced  in 2000 after six years of marriage; not dating.Not sexually active since 2011. No guns in the home. Smoke detectors in use, Always uses seat belts. Exercise: Moderate: riding bicycle 1 day per week for 20 minutes.    Family History  Problem Relation Age of Onset  . Hypertension Mother 63  . Osteoarthritis Mother   . Colon cancer Mother   . Diabetes Mother   . Cancer Mother   . Colon cancer Paternal Grandmother   . Cancer Paternal Grandmother   . Colon cancer      uncle/aunt  . Colon cancer Maternal Grandmother   . Stroke Maternal Grandmother   . Colon polyps Brother   . Cirrhosis Brother     alcoholism  . Cancer Maternal Grandfather        Review of Systems  Constitutional: Negative for fever, chills, diaphoresis and fatigue.  Respiratory: Negative for shortness of breath.   Cardiovascular: Negative for chest pain, palpitations and leg swelling.  Musculoskeletal: Positive for myalgias and back pain.  Neurological: Positive for numbness. Negative for weakness.  Hematological: Negative for adenopathy. Does not bruise/bleed easily.       Objective:   Physical Exam  Nursing note and vitals reviewed. Constitutional: She is oriented to person, place, and time. She appears well-developed and well-nourished. No distress.  Eyes: Conjunctivae normal are normal.  Cardiovascular: Normal rate, regular rhythm, normal heart sounds and intact distal pulses.  Exam reveals no gallop and no friction rub.   No murmur heard. Pulmonary/Chest: Effort normal and breath sounds normal. She has no wheezes. She has no rales.  Musculoskeletal:       Lumbar back: She exhibits tenderness and pain.  She exhibits normal range of motion, no bony tenderness, no swelling, no edema, no deformity and no spasm.       LUMBAR SPINE:  NO MIDLINE TTP; +TTP L PARASPINAL REGION AND SI REGION; STRAIGHT LEG RAISES NEGATIVE; MOTOR 5/5; TOE AND HEEL WALKING INTACT; MARCHING INTACT. GOOD RANGE OF MOTION.  Neurological: She is alert and oriented to person, place, and time. She has normal strength. No cranial nerve deficit or sensory deficit. She exhibits normal muscle tone. Coordination normal.  Skin: Skin is warm and dry. No rash noted. She is not diaphoretic.  Psychiatric: She has a normal mood and affect. Her behavior is normal.       Assessment & Plan:   1. Sciatica of left side    2. Restless leg syndrome    3. Iron deficiency anemia, unspecified  CBC, Ferritin, Iron, IBC Panel  4. Back pain  predniSONE (DELTASONE) 20 MG tablet, HYDROcodone-acetaminophen (VICODIN) 5-500 MG per tablet  5. Sciatica  predniSONE (DELTASONE) 20 MG tablet, Ambulatory referral to Sports Medicine     1. Sciatica with L radiation:  Persistent/new to this provider.  Refill of Prednisone taper for eleven days; rx for Flexeril qhs provided; refill of hydrocodone provided.  Home exercise program provided to perform daily; refer to Sports Medicine. 2.  Restless Leg Syndrome:  New and worsening.  Diagnosed during sleep study; now interfering with sleep; rx for Requip provided; obtain iron studies.   3.  Iron Deficiency Anemia:  Stable and improved at last visit; worsening RLS concerning for worsening iron deficiency; obtain labs. 4. OSA: controlled; good compliance with CPAP machine; encourage weight loss, exercise.  Meds ordered this encounter  Medications  . predniSONE (DELTASONE) 20 MG tablet    Sig: 3 tablets daily x 1 day, then 2 tablets daily x 5  days, then 1 tablet daily x 5 days    Dispense:  18 tablet    Refill:  0  . cyclobenzaprine (FLEXERIL) 5 MG tablet    Sig: 1 at bedtime for muscle spasm and back pain     Dispense:  30 tablet    Refill:  0  . HYDROcodone-acetaminophen (VICODIN) 5-500 MG per tablet    Sig: Take 1 tablet by mouth every 8 (eight) hours as needed for pain.    Dispense:  40 tablet    Refill:  0    Order Specific Question:  Supervising Provider    Answer:  DOOLITTLE, ROBERT P [[id number]]  . rOPINIRole (REQUIP) 0.25 MG tablet    Sig: Take 1 tablet (0.25 mg total) by mouth at bedtime.    Dispense:  30 tablet    Refill:  5

## 2012-11-24 DIAGNOSIS — G2581 Restless legs syndrome: Secondary | ICD-10-CM | POA: Insufficient documentation

## 2012-11-24 LAB — FERRITIN: Ferritin: 10 ng/mL (ref 10–291)

## 2012-11-27 ENCOUNTER — Ambulatory Visit: Payer: 59 | Admitting: Family Medicine

## 2012-12-21 NOTE — Progress Notes (Signed)
Reviewed and agree.

## 2012-12-31 ENCOUNTER — Encounter: Payer: Self-pay | Admitting: Family Medicine

## 2013-01-12 ENCOUNTER — Ambulatory Visit (INDEPENDENT_AMBULATORY_CARE_PROVIDER_SITE_OTHER): Payer: 59 | Admitting: Family Medicine

## 2013-01-12 VITALS — BP 128/88 | Wt 226.0 lb

## 2013-01-12 DIAGNOSIS — E119 Type 2 diabetes mellitus without complications: Secondary | ICD-10-CM

## 2013-01-12 NOTE — Progress Notes (Signed)
Patient presents today for 3 month DM follow-up as part of the employer-sponsored [employer] program. Current DM regimen includes Metformin. Patient also continues on daily ASA and statin. Patient is not being treated for HTN at this time, but MD is following this and has made patient aware she may have to start an ACEi soon. Pt continues seeing Dr. Ailene Ards, but has had no recent DM follow-up. Appt is scheduled for March 17th. MD will check A1c at this time. No med changes at this time. Patient was recently started on CPAP for sleep apnea and reports this has improved her energy levels. She continues struggling with anemia and reports sometimes feeling fatigued. MD is monitoring this. Patient has also recently seen a chiropractor for lower back pain. Patient reports new upper back and shoulder pain after being seen by chiropractor.   Diabetes Assessment: Current DM regimen includes Metformin ER 500 - 2 tabs twice daily. Patient did not bring meter today; however, per her report, she has improved testing and is now testing once daily, fasting. Readings range 112-141. Can usually explain high fasting reading by poor food choices the night before. No recent issues with hypoglyemia. Patient does report mild numbness in the great toe of her left foot.  The numbness is limited to one side, does not tingle or burn, and only occurs at night prior to going to bed.  Patient has noticed this for only ~1 week.  I have asked her to report this to her MD at follow-up in 2 weeks.    Diet: Patient reports the quality of her diet has declined. She is eating out more often now and not always making the best menu choices. Pt is also snacking more often at bedtime, maybe 1-2 times per week. Sometimes chooses icecream, cereal, etc. She often notes that fasting glucose is higer on mornings after a bedtime snack. Also, has a hectic work schedule and sometimes does not eat lunch until 3 pm and at that time is starving and  over-eats or makes the wrong choices.   Exercise: Not exercising consistently at this time. Pt wants to resume exercising at least 5 days per week for 30 min each time on stationary bike. Limiting factors include lack of energy and lack of motivation. Patient struggles with feelings of depression and this sometimes impairs her motivation to exercise. Currently being treated for depression and does admit that her symptoms improve with regular exercise.   Assessment: Patient continues to maintain fair control of DM but continues to have room for improvement in both diet and exercise. Patient has improved glucose monitoring and is now testing daily. Patient has had a 5 lb weight gain since last visit, but expected this given her diet and exercise has been less than optimal. Patient seems motivated at this visit to make changes to diet and exercise and I am hopeful she will be able to maintain these.   Plan: 1) Attempt to resume exercise, goal of 5 days per week for at least 20 minutes each time 2) Attempt to make better choices when eating out, refer to nutrition guides and calorieking website, etc.  3) Make healthier bedtime snack choices and be cautious of portion sizes.  4) Continue testing daily  5) Keep appt with Dr. Katrinka Blazing in two weeks 6) Return in 3 months for follow-up on Tues, June 3rd @ 4:00 pm

## 2013-01-18 ENCOUNTER — Ambulatory Visit: Payer: 59 | Admitting: Family Medicine

## 2013-01-25 ENCOUNTER — Ambulatory Visit (INDEPENDENT_AMBULATORY_CARE_PROVIDER_SITE_OTHER): Payer: 59 | Admitting: Family Medicine

## 2013-01-25 ENCOUNTER — Encounter: Payer: Self-pay | Admitting: Family Medicine

## 2013-01-25 VITALS — BP 128/62 | HR 87 | Temp 98.6°F | Resp 16 | Ht 61.0 in | Wt 224.9 lb

## 2013-01-25 DIAGNOSIS — R079 Chest pain, unspecified: Secondary | ICD-10-CM

## 2013-01-25 DIAGNOSIS — E785 Hyperlipidemia, unspecified: Secondary | ICD-10-CM

## 2013-01-25 DIAGNOSIS — Z9989 Dependence on other enabling machines and devices: Secondary | ICD-10-CM

## 2013-01-25 DIAGNOSIS — D509 Iron deficiency anemia, unspecified: Secondary | ICD-10-CM

## 2013-01-25 DIAGNOSIS — E119 Type 2 diabetes mellitus without complications: Secondary | ICD-10-CM

## 2013-01-25 DIAGNOSIS — M5432 Sciatica, left side: Secondary | ICD-10-CM

## 2013-01-25 DIAGNOSIS — E7800 Pure hypercholesterolemia, unspecified: Secondary | ICD-10-CM

## 2013-01-25 DIAGNOSIS — G2581 Restless legs syndrome: Secondary | ICD-10-CM

## 2013-01-25 DIAGNOSIS — F3289 Other specified depressive episodes: Secondary | ICD-10-CM

## 2013-01-25 DIAGNOSIS — F32A Depression, unspecified: Secondary | ICD-10-CM

## 2013-01-25 DIAGNOSIS — G4733 Obstructive sleep apnea (adult) (pediatric): Secondary | ICD-10-CM

## 2013-01-25 DIAGNOSIS — M5430 Sciatica, unspecified side: Secondary | ICD-10-CM

## 2013-01-25 DIAGNOSIS — F329 Major depressive disorder, single episode, unspecified: Secondary | ICD-10-CM

## 2013-01-25 LAB — COMPREHENSIVE METABOLIC PANEL
Albumin: 4.3 g/dL (ref 3.5–5.2)
BUN: 13 mg/dL (ref 6–23)
Calcium: 9.4 mg/dL (ref 8.4–10.5)
Chloride: 105 mEq/L (ref 96–112)
Glucose, Bld: 125 mg/dL — ABNORMAL HIGH (ref 70–99)
Potassium: 4.3 mEq/L (ref 3.5–5.3)

## 2013-01-25 LAB — CBC WITH DIFFERENTIAL/PLATELET
Eosinophils Relative: 12 % — ABNORMAL HIGH (ref 0–5)
HCT: 40 % (ref 36.0–46.0)
Lymphocytes Relative: 36 % (ref 12–46)
Lymphs Abs: 4.2 10*3/uL — ABNORMAL HIGH (ref 0.7–4.0)
MCV: 78.1 fL (ref 78.0–100.0)
Monocytes Absolute: 0.6 10*3/uL (ref 0.1–1.0)
RBC: 5.12 MIL/uL — ABNORMAL HIGH (ref 3.87–5.11)
WBC: 11.7 10*3/uL — ABNORMAL HIGH (ref 4.0–10.5)

## 2013-01-25 LAB — LIPID PANEL
Cholesterol: 166 mg/dL (ref 0–200)
VLDL: 36 mg/dL (ref 0–40)

## 2013-01-25 LAB — HEMOGLOBIN A1C: Mean Plasma Glucose: 151 mg/dL — ABNORMAL HIGH (ref <117)

## 2013-01-25 NOTE — Assessment & Plan Note (Signed)
Controlled; compliance with CPAP nightly; encourage weight loss.

## 2013-01-25 NOTE — Assessment & Plan Note (Signed)
Controlled; obtain labs; continue current medications. 

## 2013-01-25 NOTE — Assessment & Plan Note (Signed)
Worsening at last visit; tolerating ferrous sulfate bid; obtain labs.  Colonoscopy UTD; chronic issue for patient.

## 2013-01-25 NOTE — Assessment & Plan Note (Signed)
Improved but persistent; non-compliance with Sports Medicine referral at last visit.  Would not exempt from jury duty; refer to Sports Medicine due to persistent pain.

## 2013-01-25 NOTE — Assessment & Plan Note (Signed)
New. Atypical and non-exertional; cardiac risk factors include DMII, hypercholesterolemia, obesity, sedentary lifestyle, OSA. Refer for myoview.  To ED for worsening chest pain.

## 2013-01-25 NOTE — Patient Instructions (Addendum)
Type II or unspecified type diabetes mellitus without mention of complication, not stated as uncontrolled - Plan: Comprehensive metabolic panel, Hemoglobin A1c  Pure hypercholesterolemia - Plan: CK, Lipid panel  Iron deficiency anemia, unspecified - Plan: CBC with Differential, Iron  Sciatica, left - Plan: Ambulatory referral to Sports Medicine  Chest pain - Plan: EKG 12-Lead, Myocardial Perfusion Imaging  Restless leg syndrome  Depression   1.  DECREASE AMBIEN 10MG  TO ONE-HALF TABLET AT BEDTIME. 2.  START REQUIP ONE TABLET AT BEDTIME. 3.  YOU WILL BE SCHEDULED FOR A MYOVIEW STRESS TEST. 4.  YOU WILL BE SCHEDULED TO SEE SPORTS MEDICINE FOR LOWER BACK PAIN.

## 2013-01-25 NOTE — Progress Notes (Signed)
[address]   [place], [place]  [address]   [phone]  Subjective:    Patient ID: Cristina [name], female    DOB: [birth year], 52 y.o.   MRN: [id number]  HPI This 52 y.o. female presents for three month follow-up of the following:  1.  L sciatica:  Six week follow-up; non-compliant with referral to Sports Medicine clinic; 50% improved from last visit.  Continues to suffer with intermittent L lower back pain; radiation into L leg into foot intermittently at this time.  Numbness in first toe intermittently and occurs twice weekly.  No saddle paresthesias; no b/b dysfunction.  Pain in back will worsen with prolonged sitting at work.  Jury duty request for April; asking for exemption due to back pain.  History of bulging disc in lumbar spine fifteen years ago; has suffered with chronic R thigh numbness for years.  2. DMII:  S/p evaluation by pharmacist at [facility] on 01/12/13; last HgbA1c of 6.6 in 10/2012.  Reports compliance with medication; good tolerance to medication; good symptom control.  Not currently exercising. Non-compliant with diet frequently; likes to snack at nighttime on ice cream and cookies or cereal.  Fasting sugars ranging 114-147.  Sugars run high the following morning after high calorie bedtime snack.  S/p eye exam 10/2012; no evidence of diabetic retinopathy.  3.  RLS:  Six week follow-up; management at last visit included adding Requip.  Pt only took medication once since last visit.  Continues to jerk legs at night.  Iron level was lower; increased iron supplementation to bid; constipated; no improvement in jerking of legs.    4.  OSA:  Compliance with CPAP.  Still having some headaches in mornings; twice per week.  Keeping CPAP in place all night.  No sinus pressure; no nasal congestion; no PND; no rhinorrhea.    5.  Anxiety and depression: stable; three bad days per month.  Gets depressed due to weight.  Brother is stressing pt out; brother has been staying with pt for past week.   Compliance with medication; good tolerance to medication; good symptom control.  6. Insomnia:  Sleeping well with Ambien 10mg  qhs.  Insomnia a chronic issue for patient.  7. Chest pain:  Onset since last visit; occurring twice per month on average; usually occurs at rest.  No associated SOB, nausea, diaphoresis.  No radiation into back, jaw, or neck or arm.  Non-exertional.  No recent related worsening GERD, heartburn, indigestion.  Last stress test three years ago before hysterectomy.   Review of Systems  Constitutional: Negative for fever, chills, diaphoresis and fatigue.  HENT: Negative for congestion, sore throat, rhinorrhea, postnasal drip and sinus pressure.   Eyes: Negative for photophobia and visual disturbance.  Respiratory: Positive for apnea. Negative for cough, shortness of breath, wheezing and stridor.   Cardiovascular: Positive for chest pain. Negative for palpitations and leg swelling.  Gastrointestinal: Negative for nausea, vomiting, abdominal pain and diarrhea.  Endocrine: Negative for cold intolerance, heat intolerance, polydipsia, polyphagia and polyuria.  Musculoskeletal: Positive for back pain.  Skin: Negative for wound.  Neurological: Positive for numbness and headaches. Negative for dizziness, tremors, syncope, facial asymmetry, speech difficulty, weakness and light-headedness.  Psychiatric/Behavioral: Positive for sleep disturbance and dysphoric mood. Negative for suicidal ideas, behavioral problems and self-injury. The patient is not nervous/anxious.         Past Medical History  Diagnosis Date  . Hemorrhoid   . Fissure, anal   . Fibroids  uterine  . Cystocele   . Rectocele   . Diabetes mellitus   . Genital herpes   . Arthritis   . Depression   . Hyperlipidemia   . Obesity   . GERD (gastroesophageal reflux disease)   . CTS (carpal tunnel syndrome)   . EP (ectopic pregnancy) 1989  . Hx of migraine headaches   . Dysfunction of eustachian tube   .  Constipation   . Tobacco use disorder   . Allergic rhinitis, cause unspecified   . Iron deficiency anemia, unspecified   . Insomnia     Past Surgical History  Procedure Laterality Date  . Cholecystectomy    . Carpal tunnel release  1989    Left  . Abdominal hysterectomy      per dr Katrinka Blazing records vaginal hysterectomy  05/2010  . Ectopic pregnancy surgery  1993  . Tubal ligation  1988  . Cholecystectomy    . Sleep study  09/11/2012    severe OSA.  CPAP titration at 12 cm water pressure.    Prior to Admission medications   Medication Sig Start Date End Date Taking? Authorizing Provider  albuterol (PROAIR HFA) 108 (90 BASE) MCG/ACT inhaler Inhale 2 puffs into the lungs every 6 (six) hours as needed.   Yes Historical Provider, MD  ALPRAZolam Prudy Feeler) 0.5 MG tablet Take 1 tablet (0.5 mg total) by mouth at bedtime as needed. 10/20/12  Yes Ethelda Chick, MD  aspirin EC 81 MG tablet Take 81 mg by mouth daily.   Yes Historical Provider, MD  azelastine (ASTELIN) 137 MCG/SPRAY nasal spray Place 2 sprays into the nose 2 (two) times daily. Use in each nostril as directed 10/20/12  Yes Ethelda Chick, MD  bifidobacterium infantis (ALIGN) capsule Take 1 capsule by mouth daily. 06/29/12 06/29/13 Yes Hilarie Fredrickson, MD  buPROPion [facility] SR) 100 MG 12 hr tablet Take 1 tablet (100 mg total) by mouth 2 (two) times daily. 10/20/12  Yes Ethelda Chick, MD  Calcium Carbonate-Vitamin D (CALCIUM + D PO) Take 1 tablet by mouth daily.   Yes Historical Provider, MD  cyclobenzaprine (FLEXERIL) 5 MG tablet 1 at bedtime for muscle spasm and back pain 11/23/12  Yes Ethelda Chick, MD  ferrous sulfate 325 (65 FE) MG EC tablet Take 325 mg by mouth daily.    Yes Historical Provider, MD  fluticasone (FLONASE) 50 MCG/ACT nasal spray Place 2 sprays into the nose daily. 10/20/12  Yes Ethelda Chick, MD  Glucosamine HCl 1000 MG TABS Take 1,000 mg by mouth 3 (three) times daily.   Yes Historical Provider, MD  hydrocortisone  (ANUSOL-HC) 25 MG suppository Place 1 suppository (25 mg total) rectally 2 (two) times daily as needed for hemorrhoids. 10/20/12  Yes Ethelda Chick, MD  ibuprofen (ADVIL,MOTRIN) 600 MG tablet Take 600 mg by mouth every 6 (six) hours as needed.   Yes Historical Provider, MD  loratadine (CLARITIN) 10 MG tablet Take 10 mg by mouth daily.   Yes Historical Provider, MD  metFORMIN (GLUCOPHAGE) 500 MG tablet Take 1,000 mg by mouth 2 (two) times daily with a meal.  10/20/12  Yes Ethelda Chick, MD  Multiple Vitamin (MULTIVITAMIN) capsule Take 1 capsule by mouth daily.   Yes Historical Provider, MD  pantoprazole (PROTONIX) 40 MG tablet Take 1 tablet (40 mg total) by mouth 2 (two) times daily. 11/09/12  Yes Chelle S Jeffery, PA-C  rOPINIRole (REQUIP) 0.25 MG tablet Take 1 tablet (0.25 mg total) by mouth  at bedtime. 11/23/12  Yes Ethelda Chick, MD  senna (SENOKOT) 8.6 MG tablet Take 1 tablet by mouth daily.   Yes Historical Provider, MD  sertraline (ZOLOFT) 100 MG tablet Take 1 tablet (100 mg total) by mouth daily. 10/20/12  Yes Ethelda Chick, MD  simvastatin (ZOCOR) 40 MG tablet Take 1 tablet (40 mg total) by mouth every evening. 10/20/12  Yes Ethelda Chick, MD  valACYclovir (VALTREX) 500 MG tablet Take 1 tablet (500 mg total) by mouth 2 (two) times daily. 10/20/12  Yes Ethelda Chick, MD  zolpidem (AMBIEN) 10 MG tablet Take 1 tablet (10 mg total) by mouth at bedtime as needed. 10/20/12  Yes Ethelda Chick, MD  Casanthranol-Docusate Sodium 30-100 MG CAPS Take by mouth. One capsule every other day.    Historical Provider, MD  HYDROcodone-acetaminophen (VICODIN) 5-500 MG per tablet Take 1 tablet by mouth every 8 (eight) hours as needed for pain. 11/23/12   Ethelda Chick, MD  predniSONE (DELTASONE) 20 MG tablet 3 tablets daily x 1 day, then 2 tablets daily x 5 days, then 1 tablet daily x 5 days 11/23/12   Ethelda Chick, MD    Allergies  Allergen Reactions  . Fish Oil Rash    History   Social History    . Marital Status: Single    Spouse Name: N/A    Number of Children: 3  . Years of Education: college   Occupational History  . NURSE SECRETARY     [employer]   Social History Main Topics  . Smoking status: Former Games developer  . Smokeless tobacco: Never Used     Comment: smoked occasionally longest 6 mos.  . Alcohol Use: 0.6 oz/week    1 Glasses of wine per week     Comment: 2 x monthly drinks wine  . Drug Use: No  . Sexually Active: No   Other Topics Concern  . Not on file   Social History Narrative   Drinks coffee and soda's at least one a day. Divorced in 2000 after six years of marriage; not dating.Not sexually active since 2011. No guns in the home. Smoke detectors in use, Always uses seat belts. Exercise: Moderate: riding bicycle 1 day per week for 20 minutes.    Family History  Problem Relation Age of Onset  . Hypertension Mother 66  . Osteoarthritis Mother   . Colon cancer Mother   . Diabetes Mother   . Cancer Mother   . Colon cancer Paternal Grandmother   . Cancer Paternal Grandmother   . Colon cancer      uncle/aunt  . Stroke Maternal Grandmother   . Colon polyps Brother   . Cirrhosis Brother     alcoholism  . Cancer Maternal Grandfather     Objective:   Physical Exam  Nursing note and vitals reviewed. Constitutional: She is oriented to person, place, and time. She appears well-developed and well-nourished. No distress.  HENT:  Head: Normocephalic and atraumatic.  Right Ear: External ear normal.  Left Ear: External ear normal.  Nose: Nose normal.  Mouth/Throat: Oropharynx is clear and moist.  Eyes: Conjunctivae and EOM are normal. Pupils are equal, round, and reactive to light.  Neck: Normal range of motion. Neck supple. No thyromegaly present.  Cardiovascular: Normal rate, regular rhythm, normal heart sounds and intact distal pulses.  Exam reveals no gallop and no friction rub.   No murmur heard. Pulmonary/Chest: Effort normal and breath sounds normal. No  respiratory distress. She  has no wheezes.  Abdominal: Soft. Bowel sounds are normal. She exhibits no distension and no mass. There is no tenderness. There is no rebound and no guarding.  Musculoskeletal:       Lumbar back: She exhibits normal range of motion, no tenderness, no bony tenderness, no swelling and no spasm.  LUMBAR SPINE: STRAIGHT LEG RAISES NEGATIVE; MOTOR 5/5 BLE; MARCHING INTACT; TOE AND HEEL WALKING INTACT.  Lymphadenopathy:    She has no cervical adenopathy.  Neurological: She is alert and oriented to person, place, and time. No cranial nerve deficit or sensory deficit. She exhibits normal muscle tone. Coordination normal.  Skin: Skin is warm and dry. No rash noted. She is not diaphoretic. No erythema.  Psychiatric: She has a normal mood and affect. Her behavior is normal. Judgment and thought content normal.    EKG:  NSR; NO ST CHANGES.    Assessment & Plan:  Type II or unspecified type diabetes mellitus without mention of complication, not stated as uncontrolled - Plan: Comprehensive metabolic panel, Hemoglobin A1c  Pure hypercholesterolemia - Plan: CK, Lipid panel  Iron deficiency anemia, unspecified - Plan: CBC with Differential, Iron  Sciatica, left - Plan: Ambulatory referral to Sports Medicine  Chest pain - Plan: EKG 12-Lead, Myocardial Perfusion Imaging  Restless leg syndrome  Depression  OSA on CPAP  Other and unspecified hyperlipidemia

## 2013-01-25 NOTE — Assessment & Plan Note (Signed)
Improved and stable; no changes to management at this time.

## 2013-01-25 NOTE — Assessment & Plan Note (Signed)
Controlled.  Obtain labs; continue current medications.  Encourage improved compliance with dietary modification and exercise.  S/p pharmacy consultation 01/2013 [place].

## 2013-01-25 NOTE — Assessment & Plan Note (Signed)
Uncontrolled due to non-compliance with Requip at last visit; to restart Requip; advised to decrease Ambien 10mg  to 1/2 tablet qhs.

## 2013-01-29 ENCOUNTER — Encounter: Payer: Self-pay | Admitting: Family Medicine

## 2013-02-03 ENCOUNTER — Encounter: Payer: Self-pay | Admitting: Family Medicine

## 2013-02-03 ENCOUNTER — Encounter (HOSPITAL_COMMUNITY): Payer: 59

## 2013-02-03 ENCOUNTER — Ambulatory Visit (INDEPENDENT_AMBULATORY_CARE_PROVIDER_SITE_OTHER): Payer: 59 | Admitting: Family Medicine

## 2013-02-03 ENCOUNTER — Ambulatory Visit (HOSPITAL_COMMUNITY): Payer: 59 | Attending: Cardiology | Admitting: Radiology

## 2013-02-03 VITALS — Ht 62.0 in | Wt 227.0 lb

## 2013-02-03 VITALS — BP 109/54 | HR 99 | Ht 62.0 in | Wt 227.0 lb

## 2013-02-03 DIAGNOSIS — M5432 Sciatica, left side: Secondary | ICD-10-CM

## 2013-02-03 DIAGNOSIS — R0789 Other chest pain: Secondary | ICD-10-CM | POA: Insufficient documentation

## 2013-02-03 DIAGNOSIS — R0602 Shortness of breath: Secondary | ICD-10-CM

## 2013-02-03 DIAGNOSIS — R5381 Other malaise: Secondary | ICD-10-CM | POA: Insufficient documentation

## 2013-02-03 DIAGNOSIS — R5383 Other fatigue: Secondary | ICD-10-CM | POA: Insufficient documentation

## 2013-02-03 DIAGNOSIS — R0989 Other specified symptoms and signs involving the circulatory and respiratory systems: Secondary | ICD-10-CM | POA: Insufficient documentation

## 2013-02-03 DIAGNOSIS — R0609 Other forms of dyspnea: Secondary | ICD-10-CM | POA: Insufficient documentation

## 2013-02-03 DIAGNOSIS — M5430 Sciatica, unspecified side: Secondary | ICD-10-CM

## 2013-02-03 DIAGNOSIS — R42 Dizziness and giddiness: Secondary | ICD-10-CM | POA: Insufficient documentation

## 2013-02-03 DIAGNOSIS — R079 Chest pain, unspecified: Secondary | ICD-10-CM

## 2013-02-03 MED ORDER — PREDNISONE 5 MG PO KIT: PACK | ORAL | Status: DC

## 2013-02-03 MED ORDER — TECHNETIUM TC 99M SESTAMIBI GENERIC - CARDIOLITE
33.0000 | Freq: Once | INTRAVENOUS | Status: AC | PRN
  Administered 2013-02-03: 33 via INTRAVENOUS

## 2013-02-03 MED ORDER — CYCLOBENZAPRINE HCL 5 MG PO TABS: ORAL_TABLET | ORAL | Status: DC

## 2013-02-03 MED ORDER — HYDROCODONE-ACETAMINOPHEN 5-325 MG PO TABS: 1.0000 | ORAL_TABLET | Freq: Four times a day (QID) | ORAL | Status: DC | PRN

## 2013-02-03 NOTE — Progress Notes (Signed)
Subjective: Cristina [name] is a 51-y.o. new patient who presents to clinic with low back pain.  She describes the pain as an intermittent ache that is mainly localized to the low back with occasional sharp, shooting radiations to the left leg.  Within the past couple of weeks, she has also started experiencing numbness of the anterior aspect of her right thigh, especially with standing.  History of herniated disk 15 years ago.  Her symptoms are increased with standing or sitting for extended periods of time.  She has a prescription for Norco from her PCP and takes 1 to 2 pills on days where her pain is severe.  She saw a chiropractor 2 weeks ago, but that worsened her symptoms.  Denies fever, weakness, or bowel/bladder incontinence.  PMHx: DM, hyperlipidemia, GERD, arthritis, iron deficiency anemia, carpal tunnel syndrome.  Surgical hx: carpal tunnel release (1989), hysterectomy (2011), cholecystectomy.  Current medications include Alprazolam, Calcium + D, Glucosamine, Norco 5-325, multivitamin, Albuterol, Wellbutrin, Flexeril, Flonase, Protonix, Zoloft, Zocor, and Ambien.  She is a former smoker.  Drinks 1 glass of wine per week.  Family hx positive for HTN, OA, DM, cancer, and stroke.  Review of Systems: Negative except as noted above.  Objective: GENERAL - Alert & oriented, NAD. Obese. BACK - No apparent deformity on inspection.  Normal spine curvature.  Mild TTP to lower back.  Full range of motion with good flexion, extension, lateral flexion, and rotation.  Positive seated and supine SLR on left side. NEURO - Upper and lower extremity strength good and equal B/L.  Normal muscle tone.  DTRs intact.  Diminished sensation over anterior aspect of right thigh.  Assessment: 1. Low back pain likely secondary to history of herniated disk with recent onset of radicular symptoms.  Plan: Discussed the various treatment options for her low back pain.  We refilled her Norco and Flexeril prescriptions to be taken  as needed for pain.  Prednisone dose pack 5 mg x6 days.  She will also start formal physical therapy to strengthen her back.  She will return to clinic in 4 weeks for follow-up.  If her symptoms have worsened or not improved at that time, we will arrange for an MRI of her lower back.  She was alerted to call the clinic or go to the ER if she notices new weakness, new numbness, problems with walking, or bowel/bladder incontinence.

## 2013-02-03 NOTE — Progress Notes (Signed)
[facility] SITE 3 NUCLEAR MED [address] [place], [place] [address] [phone]    Cardiology Nuclear Med Study  Cristina [name] is a 52 y.o. female     MRN : [id number]     DOB: 1961/03/23  Procedure Date: 02/03/2013  Nuclear Med Background Indication for Stress Test:  Evaluation for Ischemia History:  2011 GXT-Normal Cardiac Risk Factors: History of Smoking, Lipids and NIDDM  Symptoms:  Chest Pain, Dizziness, DOE, Fatigue and Light-Headedness   Nuclear Pre-Procedure Caffeine/Decaff Intake:  None NPO After: 8:00pm   Lungs:  clear O2 Sat: 98% on room air. IV 0.9% NS with Angio Cath:  22g  IV Site: R Hand  IV Started by:  Bonnita Levan, RN  Chest Size (in):  46 Cup Size: DD  Height: 5\' 2"  (1.575 m)  Weight:  227 lb (102.967 kg)  BMI:  Body mass index is 41.51 kg/(m^2). Tech Comments:  N/A    Nuclear Med Study 1 or 2 day study: 2 day  Stress Test Type:  Stress  Reading MD: Order Authorizing Provider:  Nilda Simmer, MD  Resting Radionuclide: Technetium 37m Sestamibi  Resting Radionuclide Dose: 33.0 mCi on 02/04/13   Stress Radionuclide:  Technetium 53m Sestamibi  Stress Radionuclide Dose: 33.0 mCi on 02/03/13           Stress Protocol Rest HR: 80 Stress HR: 164  Rest BP: 129/81 Stress BP: 179/83  Exercise Time (min): 7:01 METS: 8.5   Predicted Max HR: 171 bpm % Max HR: 95.91 bpm Rate Pressure Product: 46962   Dose of Adenosine (mg):  n/a Dose of Lexiscan: n/a mg  Dose of Atropine (mg): n/a Dose of Dobutamine: n/a mcg/kg/min (at max HR)  Stress Test Technologist: Bonnita Levan, RN  Nuclear Technologist:  Domenic Polite, CNMT     Rest Procedure:  Myocardial perfusion imaging was performed at rest 45 minutes following the intravenous administration of Technetium 77m Sestamibi. Rest ECG: NSR - Normal EKG  Stress Procedure:  The patient exercised on the treadmill utilizing the Bruce Protocol for 7:01 minutes. The patient stopped due to fatigue and dyspnea and  c/o chest tightness 2/10 initially in recovery.  Technetium 71m Sestamibi was injected at peak exercise and myocardial perfusion imaging was performed after a brief delay. Stress ECG: Develops upsloping ST depression in inferior leads.  QPS Raw Data Images:  Normal; no motion artifact; normal heart/lung ratio. Stress Images:  Normal homogeneous uptake in all areas of the myocardium. Rest Images:  Normal homogeneous uptake in all areas of the myocardium. Subtraction (SDS):  No evidence of ischemia. Transient Ischemic Dilatation (Normal <1.22):  1.10 Lung/Heart Ratio (Normal <0.45):  0.38  Quantitative Gated Spect Images QGS EDV:  83 ml QGS ESV:  29 ml  Impression Exercise Capacity:  Fair exercise capacity. BP Response:  Normal blood pressure response. Clinical Symptoms:  Mild chest pain/dyspnea. ECG Impression:  Insignificant upsloping ST segment depression. Comparison with Prior Nuclear Study: No previous nuclear study performed  Overall Impression:  Normal stress nuclear study.  LV Ejection Fraction: 66%.  LV Wall Motion:  NL LV Function; NL Wall Motion  [employer]

## 2013-02-03 NOTE — Patient Instructions (Addendum)
Thank you for coming in today. Take the norco as needed for pain.  Take the prednisone pack.  Use the flexeril as needed. Go to PT. Come back in 4 weeks if not improved.  If worsening call and we will arrange a MRI.  Come back or go to the emergency room if you notice new weakness new numbness problems walking or bowel or bladder problems.

## 2013-02-04 ENCOUNTER — Ambulatory Visit (HOSPITAL_COMMUNITY): Payer: 59 | Attending: Cardiology | Admitting: Radiology

## 2013-02-04 DIAGNOSIS — R0989 Other specified symptoms and signs involving the circulatory and respiratory systems: Secondary | ICD-10-CM

## 2013-02-04 MED ORDER — TECHNETIUM TC 99M SESTAMIBI GENERIC - CARDIOLITE
33.0000 | Freq: Once | INTRAVENOUS | Status: AC | PRN
  Administered 2013-02-04: 33 via INTRAVENOUS

## 2013-02-07 ENCOUNTER — Encounter: Payer: Self-pay | Admitting: Family Medicine

## 2013-02-08 ENCOUNTER — Telehealth: Payer: Self-pay | Admitting: Family Medicine

## 2013-02-08 MED ORDER — ALBUTEROL SULFATE HFA 108 (90 BASE) MCG/ACT IN AERS: 2.0000 | INHALATION_SPRAY | Freq: Four times a day (QID) | RESPIRATORY_TRACT | Status: DC | PRN

## 2013-02-08 NOTE — Telephone Encounter (Signed)
Patient requesting refill of Albuterol; rx sent to pharmacy escribed.

## 2013-02-11 ENCOUNTER — Encounter: Payer: Self-pay | Admitting: Family Medicine

## 2013-02-18 ENCOUNTER — Ambulatory Visit: Payer: 59 | Admitting: Physical Therapy

## 2013-02-18 ENCOUNTER — Ambulatory Visit (INDEPENDENT_AMBULATORY_CARE_PROVIDER_SITE_OTHER): Payer: 59 | Admitting: Family Medicine

## 2013-02-18 ENCOUNTER — Ambulatory Visit: Payer: 59

## 2013-02-18 VITALS — BP 124/71 | HR 106 | Temp 99.6°F | Resp 18 | Ht 61.0 in | Wt 233.0 lb

## 2013-02-18 DIAGNOSIS — R059 Cough, unspecified: Secondary | ICD-10-CM

## 2013-02-18 DIAGNOSIS — J0100 Acute maxillary sinusitis, unspecified: Secondary | ICD-10-CM

## 2013-02-18 DIAGNOSIS — R05 Cough: Secondary | ICD-10-CM

## 2013-02-18 DIAGNOSIS — R079 Chest pain, unspecified: Secondary | ICD-10-CM

## 2013-02-18 DIAGNOSIS — E119 Type 2 diabetes mellitus without complications: Secondary | ICD-10-CM

## 2013-02-18 DIAGNOSIS — J029 Acute pharyngitis, unspecified: Secondary | ICD-10-CM

## 2013-02-18 LAB — POCT CBC
Granulocyte percent: 59.6 %G (ref 37–80)
Hemoglobin: 10.5 g/dL — AB (ref 12.2–16.2)
Lymph, poc: 3.5 — AB (ref 0.6–3.4)
MCHC: 30.1 g/dL — AB (ref 31.8–35.4)
MPV: 8.8 fL (ref 0–99.8)
POC Granulocyte: 6.5 (ref 2–6.9)
POC MID %: 8.1 %M (ref 0–12)
RDW, POC: 18 %

## 2013-02-18 LAB — POCT INFLUENZA A/B
Influenza A, POC: NEGATIVE
Influenza B, POC: NEGATIVE

## 2013-02-18 MED ORDER — CEFDINIR 300 MG PO CAPS: 600.0000 mg | ORAL_CAPSULE | Freq: Every day | ORAL | Status: DC

## 2013-02-18 MED ORDER — PREDNISONE 20 MG PO TABS: ORAL_TABLET | ORAL | Status: DC

## 2013-02-18 NOTE — Progress Notes (Signed)
[address]   [place], [place]  [address]   [phone]  Subjective:    Patient ID: Cristina [name], female    DOB: [birth year], 52 y.o.   MRN: [id number]  HPI This 52 y.o. female presents for evaluation of sore throat, rhinorrhea.  HA, sore throat, ear pain.  +feverish; +chills/sweats.  +HA maxillary region, frontal region.  L ear pain; +rhinorrhea white.  +nasal congestion.  +PND.  +coughing more at night.  +SOB.  +chest pain this week more.  Mild body aches.  Onset four days ago. ST; hoarseness.  ST intermittent; scratchy current; no pain with swallowing.  NO v/d.  No medications but Aleve, Claritin, nasal spray (Astelin, Flonase).  Sugar 120-130 this week.  2. Chest pain: s/p stress testing negative; still having persistent chest pain.  Usually occurs at night or at rest.  Sharp pain. +worsening reflux at times.  No diaphoresis.       Review of Systems  Constitutional: Positive for fever, chills and fatigue.  HENT: Positive for congestion, sore throat, rhinorrhea, sneezing, voice change, postnasal drip and sinus pressure. Negative for trouble swallowing.   Respiratory: Positive for cough, shortness of breath and wheezing.   Gastrointestinal: Negative for nausea, vomiting, abdominal pain and diarrhea.  Endocrine: Negative for cold intolerance, heat intolerance, polydipsia, polyphagia and polyuria.  Skin: Negative for rash.  Neurological: Positive for headaches. Negative for dizziness.       Objective:   Physical Exam  Nursing note and vitals reviewed. Constitutional: She is oriented to person, place, and time. She appears well-developed and well-nourished. No distress.  HENT:  Head: Normocephalic and atraumatic.  Right Ear: External ear normal.  Left Ear: External ear normal.  Nose: Right sinus exhibits maxillary sinus tenderness. Left sinus exhibits maxillary sinus tenderness.  Mouth/Throat: Oropharynx is clear and moist.  Neck: Normal range of motion. Neck supple.    Cardiovascular: Normal rate and regular rhythm.   Pulmonary/Chest: Effort normal and breath sounds normal. She has no wheezes. She has no rales.  Lymphadenopathy:    She has cervical adenopathy.  Neurological: She is alert and oriented to person, place, and time.  Skin: She is not diaphoretic.  Psychiatric: She has a normal mood and affect. Her behavior is normal.   Results for orders placed in visit on 02/18/13  POCT CBC      Result Value Range   WBC 10.9 (*) 4.6 - 10.2 K/uL   Lymph, poc 3.5 (*) 0.6 - 3.4   POC LYMPH PERCENT 32.3  10 - 50 %L   MID (cbc) 0.9  0 - 0.9   POC MID % 8.1  0 - 12 %M   POC Granulocyte 6.5  2 - 6.9   Granulocyte percent 59.6  37 - 80 %G   RBC 4.19  4.04 - 5.48 M/uL   Hemoglobin 10.5 (*) 12.2 - 16.2 g/dL   HCT, POC 21.3 (*) 08.6 - 47.9 %   MCV 83.2  80 - 97 fL   MCH, POC 25.1 (*) 27 - 31.2 pg   MCHC 30.1 (*) 31.8 - 35.4 g/dL   RDW, POC 57.8     Platelet Count, POC 310  142 - 424 K/uL   MPV 8.8  0 - 99.8 fL  POCT INFLUENZA A/B      Result Value Range   Influenza A, POC Negative     Influenza B, POC Negative    POCT RAPID STREP A (OFFICE)      Result  Value Range   Rapid Strep A Screen Negative  Negative   [facility] reading (PRIMARY) by  Dr. Katrinka Blazing.  CXR: NAD       Assessment & Plan:  Sore throat - Plan: POCT CBC, POCT Influenza A/B, POCT rapid strep A  Cough - Plan: DG Chest 2 View  Chest pain - Plan: DG Chest 2 View  Sinusitis Maxillary Acute  Diabetes Mellitus, Type II  1. Acute Sinusitis Maxillary:  New.  Rx for Omnicef provided; rx for Prednisone also provided.  Recommend rest, fluids. 2.  Chest pain:  Persistent; s/p stress testing that was negative for ischemia; s/p CXR negative in office. 3.  Cough:  New.  CXR negative; recommend Mucinex DM bid for cough, congestion. 4. DMII: controlled; monitor sugars closely with Prednisone taper and acute illness.   Meds ordered this encounter  Medications  . cefdinir (OMNICEF) 300 MG capsule     Sig: Take 2 capsules (600 mg total) by mouth daily.    Dispense:  20 capsule    Refill:  0  . predniSONE (DELTASONE) 20 MG tablet    Sig: Three tablets daily x 1 day, then two tablets daily x 5 days, then one tablet daily x 5 days    Dispense:  18 tablet    Refill:  0

## 2013-02-18 NOTE — Patient Instructions (Addendum)
Sore throat - Plan: POCT CBC, POCT Influenza A/B, POCT rapid strep A  Cough - Plan: DG Chest 2 View  Chest pain - Plan: DG Chest 2 View  Sinusitis, acute, maxillary    PURCHASE MUCINEX D AND TAKE FOR THE NEXT WEEK. RECOMMEND SWITCHING TO ZYRTEC OR ALLEGRA AND STOPPING CLARITIN.

## 2013-02-24 ENCOUNTER — Encounter: Payer: Self-pay | Admitting: Family Medicine

## 2013-03-01 ENCOUNTER — Telehealth: Payer: Self-pay

## 2013-03-01 MED ORDER — FLUCONAZOLE 150 MG PO TABS: 150.0000 mg | ORAL_TABLET | Freq: Once | ORAL | Status: DC

## 2013-03-01 NOTE — Telephone Encounter (Signed)
PT STATES THE ANTIBIOTICS SHE WAS GIVEN IS CAUSING HER TO HAVE A YEAST INFECTION AND WOULD LIKE TO HAVE SOME MEDICINE CALLED IN. PLEASE CALL PT AT [place]

## 2013-03-01 NOTE — Telephone Encounter (Signed)
Sent in for her. Called her to advise.

## 2013-03-02 ENCOUNTER — Telehealth: Payer: Self-pay | Admitting: Family Medicine

## 2013-03-02 MED ORDER — FLUCONAZOLE 150 MG PO TABS: 150.0000 mg | ORAL_TABLET | Freq: Once | ORAL | Status: DC

## 2013-03-02 NOTE — Telephone Encounter (Signed)
Patient sent MyChart Message requesting rx for Diflucan for yeast infection while taking antibiotics; rx for Diflucan sent in.

## 2013-03-03 ENCOUNTER — Ambulatory Visit: Payer: 59 | Attending: Family Medicine | Admitting: Physical Therapy

## 2013-03-03 DIAGNOSIS — R293 Abnormal posture: Secondary | ICD-10-CM | POA: Insufficient documentation

## 2013-03-03 DIAGNOSIS — M545 Low back pain, unspecified: Secondary | ICD-10-CM | POA: Insufficient documentation

## 2013-03-03 DIAGNOSIS — IMO0001 Reserved for inherently not codable concepts without codable children: Secondary | ICD-10-CM | POA: Insufficient documentation

## 2013-03-09 ENCOUNTER — Ambulatory Visit: Payer: 59 | Admitting: Cardiovascular Disease

## 2013-03-11 ENCOUNTER — Ambulatory Visit (INDEPENDENT_AMBULATORY_CARE_PROVIDER_SITE_OTHER): Payer: 59 | Admitting: Sports Medicine

## 2013-03-11 ENCOUNTER — Ambulatory Visit: Payer: 59 | Admitting: Physical Therapy

## 2013-03-11 VITALS — BP 137/83 | Ht 61.0 in | Wt 227.0 lb

## 2013-03-11 DIAGNOSIS — M7741 Metatarsalgia, right foot: Secondary | ICD-10-CM

## 2013-03-11 DIAGNOSIS — M7742 Metatarsalgia, left foot: Secondary | ICD-10-CM

## 2013-03-11 DIAGNOSIS — M7750 Other enthesopathy of unspecified foot: Secondary | ICD-10-CM

## 2013-03-11 NOTE — Progress Notes (Signed)
Cristina [name] is a 52 y.o. female who presents to [facility] today for bilateral metatarsal pain for the last 2-3 weeks. The right foot is worse than left. Patient denies any injury. She denies any radiating pain weakness numbness fevers or chills. She was last seen for sciatica which has resolved in the interim. She has not tried any treatment for her metatarsal pain.    PMH reviewed. Obesity History  Substance Use Topics  . Smoking status: Former Games developer  . Smokeless tobacco: Never Used     Comment: smoked occasionally longest 6 mos.  . Alcohol Use: 0.6 oz/week    1 Glasses of wine per week     Comment: 2 x monthly drinks wine   ROS as above otherwise neg   Exam:  BP 137/83  Ht 5\' 1"  (1.549 m)  Wt 227 lb (102.967 kg)  BMI 42.91 kg/m2 Gen: Well NAD MSK: Bilateral feet. Reasonably well preserved long arch with collapse upon standing.  Collapse transverse arch with callus formation at the third metatarsal heads bilaterally.  Tender to palpation over the third metatarsal head. Pulses capillary refill and sensation are intact distally  Dg Chest 2 View  02/19/2013  *RADIOLOGY REPORT*  Clinical Data: Chest pain.  CHEST - 2 VIEW  Comparison: One-view chest 05/29/2010.  Findings: Heart size is normal.  Lungs are clear.  The visualized soft tissues and bony thorax are unremarkable.  IMPRESSION: No acute cardiopulmonary disease.   Original Report Authenticated By: Marin Roberts, M.D.

## 2013-03-11 NOTE — Patient Instructions (Addendum)
Thank you for coming in today. I am happy that your back is getting better.  You have metatarsalgia. This is pain at the end of the foot bones.  To treat it you use metatarsal pads just behind the end of the foot bones.  You can order them yourself from [facility]. You use smalls.   Come back any time.

## 2013-03-11 NOTE — Assessment & Plan Note (Signed)
Fitted patient for metatarsal pad (small).  She notes improvement in pain with metatarsal pads .  She will order them herself from [facility].  She will fit into her shoes herself and followup as needed

## 2013-04-02 ENCOUNTER — Encounter: Payer: Self-pay | Admitting: Family Medicine

## 2013-04-02 ENCOUNTER — Ambulatory Visit (INDEPENDENT_AMBULATORY_CARE_PROVIDER_SITE_OTHER): Payer: 59 | Admitting: Family Medicine

## 2013-04-02 VITALS — BP 146/91 | Ht 61.0 in | Wt 231.0 lb

## 2013-04-02 DIAGNOSIS — M7750 Other enthesopathy of unspecified foot: Secondary | ICD-10-CM

## 2013-04-02 DIAGNOSIS — M7742 Metatarsalgia, left foot: Secondary | ICD-10-CM

## 2013-04-02 DIAGNOSIS — M7741 Metatarsalgia, right foot: Secondary | ICD-10-CM

## 2013-04-02 NOTE — Patient Instructions (Signed)
Lets try the sports insoles for about 2-4 weeks.  If they are working we can either continue them and you can get more online and we will make the corrections  Or if you are better but still not quite perfect then come back and we will make custom orthotics.

## 2013-04-02 NOTE — Progress Notes (Signed)
Cristina [name] is a 52 y.o. female who presents to [facility] today for right metatarsal pain for greater than 6 months. Patient states has not made much improvement.  Patient was given metatarsal pads but did not know where in her shoes she should put them so she on them.. She denies any radiating pain weakness numbness fevers or chills. No injury.     Exam:  BP 146/91  Ht 5' 1"  (1.549 m)  Wt 231 lb (104.781 kg)  BMI 43.67 kg/m2 Gen: Well NAD MSK: Bilateral feet. Reasonably well preserved long arch with collapse upon standing.  Collapse transverse arch with callus formation at the third metatarsal heads bilaterally.  overpronation of th ehind foot bilaterally right greater than left.  Pulses capillary refill and sensation are intact distally  Ambulation: Patient does have mild external rotation of the tibia bilaterally and severe over pronation of hind foot on the right.   Placed in green insoles with medial heel wedge on the right and scaphoid pads bilaterally with smal MT pads.   Did cause patient pain to resolve and walked in much more nuetral position.

## 2013-04-02 NOTE — Assessment & Plan Note (Signed)
Given sports insoles with corrections notes, patient felt much better.  Patient also given HEP to try to decrease pain and help retain amount of arch she has left.  Will follow up again in 2-3 weeks and if better but not perfect we will have patient get custom orthotics.

## 2013-04-30 ENCOUNTER — Telehealth: Payer: Self-pay

## 2013-04-30 MED ORDER — ACYCLOVIR 400 MG PO TABS: 400.0000 mg | ORAL_TABLET | Freq: Two times a day (BID) | ORAL | Status: DC

## 2013-04-30 NOTE — Telephone Encounter (Signed)
I do not see where pt has been on acyclovir.  Med list shows valacyclovir 500mg .  Can you clarify with pt or pharmacy so we can send the right med?

## 2013-04-30 NOTE — Telephone Encounter (Signed)
[facility] requests refill on Acyclovir 400 mg

## 2013-04-30 NOTE — Telephone Encounter (Signed)
Sent acyclovir

## 2013-04-30 NOTE — Telephone Encounter (Signed)
Called, unsure why this was not send electronically. Pharmacy advised it is for Acyclovir 400 mg and this was previously written by DrSmith

## 2013-05-03 ENCOUNTER — Encounter: Payer: Self-pay | Admitting: Family Medicine

## 2013-05-03 ENCOUNTER — Other Ambulatory Visit: Payer: Self-pay | Admitting: Internal Medicine

## 2013-05-04 ENCOUNTER — Telehealth: Payer: Self-pay | Admitting: Gastroenterology

## 2013-05-05 MED ORDER — ACYCLOVIR 400 MG PO TABS: 400.0000 mg | ORAL_TABLET | Freq: Two times a day (BID) | ORAL | Status: DC

## 2013-05-06 ENCOUNTER — Telehealth: Payer: Self-pay

## 2013-05-06 MED ORDER — DILTIAZEM GEL 2 %: 1.0000 "application " | Freq: Two times a day (BID) | CUTANEOUS | Status: DC

## 2013-05-06 NOTE — Telephone Encounter (Signed)
Message copied by Audrea Muscat on Thu May 06, 2013  1:35 PM ------      Message from: Irene Shipper      Created: Tue May 04, 2013 11:08 AM       I can't see where I ever prescribed this. Can you? If so, for what?      ----- Message -----         From: Audrea Muscat, CMA         Sent: 05/04/2013  10:53 AM           To: Irene Shipper, MD            Patient requesting refill of Diltiazem, which was last prescribed in 2012.  Last office visit 03/2012, colonoscopy 8, 2013.  Please advise  :)       ------

## 2013-05-06 NOTE — Telephone Encounter (Signed)
Patient stated her previous anal fissure was giving her problems with pain and a little blood.  Per Dr. Henrene Pastor, I sent an rx for Diltiazem gel to [facility].

## 2013-05-06 NOTE — Telephone Encounter (Signed)
Message copied by Audrea Muscat on Thu May 06, 2013  1:36 PM ------      Message from: Irene Shipper      Created: Tue May 04, 2013  1:19 PM       Thank you. Contact the patient to confirm that she needs this for recurrent problems with anal fissure. If so, okay to refill. Also, make sure it's diltiazem CREAM, not pills. Thanks.      ----- Message -----         From: Audrea Muscat, CMA         Sent: 05/04/2013  11:32 AM           To: Irene Shipper, MD            I found it in [employer] dated 01-04-11; hx of anal fissure; see below:            Visit Type: Initial Visit      Primary GI MD: Scarlette Shorts MD      Primary Provider: Reginia Forts, MD      Chief Complaint: Dysphagia with solids and rectal pain constantly x 2 months      History of Present Illness:    52 year old female with obesity, hyperlipidemia, diabetes mellitus, an anal fissure. Last seen in June of 2008 4 screening colonoscopy. First degree relative as well as aunt and uncle with colon cancer. The examination was normal. Followup in 5 years recommended. She presents now with chief complaint of dysphagia. Also complained of rectal pain. First, she reports a history of anal fissure. Currently describes pain and burning with defecation for approximately 2 months. No significant bleeding. No treatment initiated. Next, a one-year history of intermittent solid food dysphagia. No significant weight change. No palpable pain. She had been placed on a PPI without change in symptoms. Currently on Nexium 40 mg daily. Takes Glucophage for diabetes              ----- Message -----         From: Irene Shipper, MD         Sent: 05/04/2013  11:08 AM           To: Audrea Muscat, CMA            I can't see where I ever prescribed this. Can you? If so, for what?      ----- Message -----         From: Audrea Muscat, CMA         Sent: 05/04/2013  10:53 AM           To: Irene Shipper, MD            Patient requesting refill of Diltiazem, which was  last prescribed in 2012.  Last office visit 03/2012, colonoscopy 8, 2013.  Please advise  :)                   ------

## 2013-05-06 NOTE — Telephone Encounter (Signed)
Left message for patient to call me back to clarify some information, at which point I would then refill her Diltiazem.

## 2013-05-11 ENCOUNTER — Telehealth: Payer: Self-pay

## 2013-05-11 NOTE — Telephone Encounter (Signed)
Pt Signed ROI, Picked Up Copy Of Stress Test 05/11/13/KM

## 2013-05-24 ENCOUNTER — Ambulatory Visit (INDEPENDENT_AMBULATORY_CARE_PROVIDER_SITE_OTHER): Payer: 59 | Admitting: Family Medicine

## 2013-05-24 ENCOUNTER — Encounter: Payer: Self-pay | Admitting: Family Medicine

## 2013-05-24 VITALS — BP 120/92 | HR 75 | Temp 98.2°F | Resp 16 | Ht 60.5 in | Wt 233.2 lb

## 2013-05-24 DIAGNOSIS — G2581 Restless legs syndrome: Secondary | ICD-10-CM

## 2013-05-24 DIAGNOSIS — F329 Major depressive disorder, single episode, unspecified: Secondary | ICD-10-CM

## 2013-05-24 DIAGNOSIS — Z01419 Encounter for gynecological examination (general) (routine) without abnormal findings: Secondary | ICD-10-CM | POA: Insufficient documentation

## 2013-05-24 DIAGNOSIS — E119 Type 2 diabetes mellitus without complications: Secondary | ICD-10-CM

## 2013-05-24 DIAGNOSIS — R079 Chest pain, unspecified: Secondary | ICD-10-CM

## 2013-05-24 DIAGNOSIS — Z0000 Encounter for general adult medical examination without abnormal findings: Secondary | ICD-10-CM

## 2013-05-24 DIAGNOSIS — D509 Iron deficiency anemia, unspecified: Secondary | ICD-10-CM

## 2013-05-24 DIAGNOSIS — F32A Depression, unspecified: Secondary | ICD-10-CM

## 2013-05-24 LAB — CBC WITH DIFFERENTIAL/PLATELET
Basophils Relative: 1 % (ref 0–1)
Eosinophils Absolute: 0.2 10*3/uL (ref 0.0–0.7)
Eosinophils Relative: 2 % (ref 0–5)
Lymphs Abs: 3 10*3/uL (ref 0.7–4.0)
MCH: 27 pg (ref 26.0–34.0)
MCHC: 32.9 g/dL (ref 30.0–36.0)
MCV: 82.1 fL (ref 78.0–100.0)
Platelets: 307 10*3/uL (ref 150–400)
RBC: 4.63 MIL/uL (ref 3.87–5.11)
RDW: 15.4 % (ref 11.5–15.5)

## 2013-05-24 LAB — POCT URINALYSIS DIPSTICK
Blood, UA: NEGATIVE
Protein, UA: NEGATIVE
Spec Grav, UA: 1.02
Urobilinogen, UA: 0.2

## 2013-05-24 LAB — COMPREHENSIVE METABOLIC PANEL
AST: 28 U/L (ref 0–37)
BUN: 13 mg/dL (ref 6–23)
CO2: 25 mEq/L (ref 19–32)
Calcium: 9.6 mg/dL (ref 8.4–10.5)
Chloride: 104 mEq/L (ref 96–112)
Creat: 0.68 mg/dL (ref 0.50–1.10)
Total Bilirubin: 0.5 mg/dL (ref 0.3–1.2)

## 2013-05-24 LAB — POCT UA - MICROSCOPIC ONLY
Crystals, Ur, HPF, POC: NEGATIVE
Mucus, UA: NEGATIVE
Yeast, UA: NEGATIVE

## 2013-05-24 LAB — HEMOGLOBIN A1C: Hgb A1c MFr Bld: 6.7 % — ABNORMAL HIGH (ref <5.7)

## 2013-05-24 LAB — LIPID PANEL
Cholesterol: 129 mg/dL (ref 0–200)
HDL: 39 mg/dL — ABNORMAL LOW (ref >39)
Triglycerides: 117 mg/dL (ref <150)

## 2013-05-24 LAB — FOLATE: Folate: >20 ng/mL

## 2013-05-24 MED ORDER — ROPINIROLE HCL 0.5 MG PO TABS: 0.5000 mg | ORAL_TABLET | Freq: Every day | ORAL | Status: DC

## 2013-05-24 MED ORDER — HYDROCORTISONE ACETATE 25 MG RE SUPP: 25.0000 mg | Freq: Two times a day (BID) | RECTAL | Status: DC | PRN

## 2013-05-24 NOTE — Progress Notes (Signed)
[address]   [place], [place]  [address]   [phone]  Subjective:    Patient ID: Cristina [name], female    DOB: April 16, 1961, 52 y.o.   MRN: [id number]  HPI This 52 y.o. female presents for evaluation for CPE.  Last physical 2013. Pap smear 12/18/10. Mammogram 10/20/12. Colonoscopy 06/29/12. TDAP 02/22/10. Pneumovax 11/12/2007. Hepatitis B 11/12/1999. Flu vaccine 08/14/12. Eye exam 08/2012.  [place]. No diabetic retinopathy.  No glaucoma or cataracts.  +contacts. Dental exam every six months.  Ladona Ridgel.  Iron deficiency: taking iron tablets bid; would like to decrease daily dose.  Feeling well.  Insomnia: worsening; tried Requip 0.25mg  qhs and 1/2 Ambien 10mg  qhs but had difficulties sleeping;so returned to Ambien 10mg  qhs.    Depression: decreased Zoloft to 50mg  q d and decreased Wellbutrin SR 100mg  q am only due to dry mouth; emotionally not doing as well as with higher doses of medication.  +SI but not plan; would not carry out.    DMII: fasting sugars running 120-140s.     Review of Systems  Constitutional: Positive for fatigue. Negative for fever, chills, diaphoresis, activity change, appetite change and unexpected weight change.  HENT: Negative for hearing loss, ear pain, nosebleeds, congestion, sore throat, facial swelling, rhinorrhea, sneezing, drooling, mouth sores, trouble swallowing, neck pain, neck stiffness, dental problem, voice change, postnasal drip, sinus pressure, tinnitus and ear discharge.   Eyes: Negative for photophobia, pain, discharge, redness, itching and visual disturbance.  Respiratory: Negative for apnea, cough, choking, chest tightness, shortness of breath, wheezing and stridor.   Cardiovascular: Negative for chest pain, palpitations and leg swelling.  Gastrointestinal: Positive for constipation and anal bleeding. Negative for nausea, vomiting, abdominal pain, diarrhea, blood in stool, abdominal distention and rectal pain.  Endocrine: Positive for heat  intolerance. Negative for cold intolerance, polydipsia, polyphagia and polyuria.  Genitourinary: Negative for dysuria, urgency, frequency, hematuria, flank pain, decreased urine volume, vaginal bleeding, vaginal discharge, enuresis, difficulty urinating, genital sores, pelvic pain and dyspareunia.  Musculoskeletal: Positive for back pain and gait problem. Negative for myalgias, joint swelling and arthralgias.  Skin: Negative for color change, pallor, rash and wound.  Neurological: Negative for dizziness, tremors, seizures, syncope, facial asymmetry, speech difficulty, weakness, light-headedness, numbness and headaches.  Hematological: Negative for adenopathy. Does not bruise/bleed easily.  Psychiatric/Behavioral: Positive for suicidal ideas, sleep disturbance and dysphoric mood. Negative for hallucinations, behavioral problems, confusion, self-injury, decreased concentration and agitation. The patient is not nervous/anxious and is not hyperactive.     Past Medical History  Diagnosis Date  . Hemorrhoid   . Fissure, anal   . Fibroids     uterine  . Cystocele   . Rectocele   . Diabetes mellitus   . Genital herpes   . Arthritis   . Depression   . Hyperlipidemia   . Obesity   . GERD (gastroesophageal reflux disease)   . CTS (carpal tunnel syndrome)   . EP (ectopic pregnancy) 1989  . Hx of migraine headaches   . Dysfunction of eustachian tube   . Constipation   . Tobacco use disorder   . Allergic rhinitis, cause unspecified   . Iron deficiency anemia, unspecified   . Insomnia     Past Surgical History  Procedure Laterality Date  . Cholecystectomy    . Carpal tunnel release  1989    Left  . Ectopic pregnancy surgery  1993  . Tubal ligation  1988  . Cholecystectomy    . Sleep study  09/11/2012  severe OSA.  CPAP titration at 12 cm water pressure.  . Abdominal hysterectomy  11/11/2010    Senaida Ores.  Ovaries intact.  Uterine fibroids with DUB.  . Colonoscopy  06/29/2012     normal.  Repeat in 5 years.  . Esophagogastroduodenoscopy  12/13/2011    dysphagia.  Marina Goodell.  Normal.    Prior to Admission medications   Medication Sig Start Date End Date Taking? Authorizing Provider  acyclovir (ZOVIRAX) 400 MG tablet Take 1 tablet (400 mg total) by mouth 2 (two) times daily. 05/05/13  Yes Ethelda Chick, MD  albuterol (PROAIR HFA) 108 (90 BASE) MCG/ACT inhaler Inhale 2 puffs into the lungs every 6 (six) hours as needed. 02/08/13  Yes Ethelda Chick, MD  ALPRAZolam Prudy Feeler) 0.5 MG tablet Take 1 tablet (0.5 mg total) by mouth at bedtime as needed. 10/20/12  Yes Ethelda Chick, MD  aspirin EC 81 MG tablet Take 81 mg by mouth daily.   Yes Historical Provider, MD  azelastine (ASTELIN) 137 MCG/SPRAY nasal spray Place 2 sprays into the nose 2 (two) times daily. Use in each nostril as directed 10/20/12  Yes Ethelda Chick, MD  bifidobacterium infantis (ALIGN) capsule Take 1 capsule by mouth daily. 06/29/12 06/29/13 Yes Hilarie Fredrickson, MD  buPROPion [facility] SR) 100 MG 12 hr tablet Take 1 tablet (100 mg total) by mouth 2 (two) times daily. 10/20/12  Yes Ethelda Chick, MD  Calcium Carbonate-Vitamin D (CALCIUM + D PO) Take 1 tablet by mouth daily.   Yes Historical Provider, MD  cetirizine (ZYRTEC) 10 MG tablet Take 10 mg by mouth daily.   Yes Historical Provider, MD  ferrous sulfate 325 (65 FE) MG EC tablet Take 325 mg by mouth daily.    Yes Historical Provider, MD  fluticasone (FLONASE) 50 MCG/ACT nasal spray Place 2 sprays into the nose daily. 10/20/12  Yes Ethelda Chick, MD  Glucosamine HCl 1000 MG TABS Take 1,000 mg by mouth 3 (three) times daily.   Yes Historical Provider, MD  hydrocortisone (ANUSOL-HC) 25 MG suppository Place 1 suppository (25 mg total) rectally 2 (two) times daily as needed for hemorrhoids. 05/24/13  Yes Ethelda Chick, MD  ibuprofen (ADVIL,MOTRIN) 600 MG tablet Take 600 mg by mouth every 6 (six) hours as needed.   Yes Historical Provider, MD  metFORMIN (GLUCOPHAGE)  500 MG tablet Take 1,000 mg by mouth 2 (two) times daily with a meal.  10/20/12  Yes Ethelda Chick, MD  Multiple Vitamin (MULTIVITAMIN) capsule Take 1 capsule by mouth daily.   Yes Historical Provider, MD  pantoprazole (PROTONIX) 40 MG tablet Take 1 tablet (40 mg total) by mouth 2 (two) times daily. 11/09/12  Yes Chelle S Jeffery, PA-C  rOPINIRole (REQUIP) 0.5 MG tablet Take 1 tablet (0.5 mg total) by mouth at bedtime. 05/24/13  Yes Ethelda Chick, MD  senna (SENOKOT) 8.6 MG tablet Take 1 tablet by mouth daily.   Yes Historical Provider, MD  sertraline (ZOLOFT) 100 MG tablet Take 1 tablet (100 mg total) by mouth daily. 10/20/12  Yes Ethelda Chick, MD  simvastatin (ZOCOR) 40 MG tablet Take 1 tablet (40 mg total) by mouth every evening. 10/20/12  Yes Ethelda Chick, MD  zolpidem (AMBIEN) 10 MG tablet Take 1 tablet (10 mg total) by mouth at bedtime as needed. 10/20/12  Yes Ethelda Chick, MD    Allergies  Allergen Reactions  . Fish Oil Rash    History   Social History  .  Marital Status: Single    Spouse Name: N/A    Number of Children: 3  . Years of Education: college   Occupational History  . NURSE SECRETARY     [employer]   Social History Main Topics  . Smoking status: Former Games developer  . Smokeless tobacco: Never Used     Comment: smoked occasionally longest 6 mos.  . Alcohol Use: 0.6 oz/week    1 Glasses of wine per week     Comment: 2 x monthly drinks wine  . Drug Use: No  . Sexually Active: No   Other Topics Concern  . Not on file   Social History Narrative   Drinks coffee and soda's at least one a day.    Marital status:  Divorced in 2000 after six years of marriage; not dating.      Children:  3 daughters (20, 33, 42); 5 grandchildren.      Lives: alone.  2 dogs      Employment:  Working at [employer] as [employer] with [employer] since 2012.      Tobacco: none      Alcohol: socially; special occasions.      Drugs: none      Exercise:  Sporadic; stationary bike.       Sexual activity:  Not sexually active since 2011.       Guns:  No guns in the home.       Smoke detectors in use,       Sometimes uses seat belts.     Family History  Problem Relation Age of Onset  . Hypertension Mother 8  . Osteoarthritis Mother   . Colon cancer Mother   . Diabetes Mother   . Cancer Mother 58    Colon cancer  . Colon cancer Paternal Grandmother   . Cancer Paternal Grandmother   . Colon cancer      uncle/aunt  . Stroke Maternal Grandmother   . Colon polyps Brother   . Cirrhosis Brother     alcoholism  . Cancer Maternal Grandfather        Objective:   Physical Exam  Nursing note and vitals reviewed. Constitutional: She is oriented to person, place, and time. She appears well-developed and well-nourished. No distress.  HENT:  Head: Normocephalic and atraumatic.  Right Ear: External ear normal.  Left Ear: External ear normal.  Nose: Nose normal.  Mouth/Throat: Oropharynx is clear and moist.  Eyes: Conjunctivae and EOM are normal. Pupils are equal, round, and reactive to light.  Neck: Normal range of motion. Neck supple. No JVD present. No thyromegaly present.  Cardiovascular: Normal rate, regular rhythm, normal heart sounds and intact distal pulses.  Exam reveals no gallop and no friction rub.   No murmur heard. Pulmonary/Chest: Effort normal and breath sounds normal. No respiratory distress. She has no wheezes. She has no rales.  Abdominal: Soft. Bowel sounds are normal. She exhibits no distension and no mass. There is no tenderness. There is no rebound and no guarding.  Genitourinary: Vagina normal. Rectal exam shows external hemorrhoid and internal hemorrhoid. No breast swelling, tenderness, discharge or bleeding. There is no rash, tenderness or lesion on the right labia. There is no rash, tenderness or lesion on the left labia. Right adnexum displays no mass, no tenderness and no fullness. Left adnexum displays no mass, no tenderness and no fullness.    Musculoskeletal:       Right shoulder: Normal.       Left shoulder: Normal.  Cervical back: Normal.  Lymphadenopathy:    She has no cervical adenopathy.  Neurological: She is alert and oriented to person, place, and time. She has normal reflexes. No cranial nerve deficit. She exhibits normal muscle tone. Coordination normal.  Skin: Skin is warm and dry. No rash noted. She is not diaphoretic. No erythema. No pallor.  Psychiatric: She has a normal mood and affect. Her behavior is normal. Judgment and thought content normal.    EKG:  NSR: poor R wave progression V1-V3      Assessment & Plan:

## 2013-05-24 NOTE — Assessment & Plan Note (Signed)
Anticipatory guidance --- weight loss, exercise.  Pap smear UTD.  Mammogram UTD.  Colonoscopy UTD.  Immunizations UTD.  Obtain labs.

## 2013-05-24 NOTE — Assessment & Plan Note (Signed)
Uncontrolled; increase Requip to 0.5mg  qhs; decrease Ambien to 10mg  1/2 qhs.

## 2013-05-24 NOTE — Assessment & Plan Note (Signed)
Worsening due to non-compliance with Zoloft and Wellbutrin due to dry mouth; advised to increase Zoloft to 100mg  daily; continue Wellbutrin SR 100mg  every a.m. Only.  Contracted safety in office.

## 2013-05-24 NOTE — Assessment & Plan Note (Signed)
Controlled; obtain labs; continue current medications. 

## 2013-05-24 NOTE — Assessment & Plan Note (Signed)
Resolved; s/p cardiolite that was negative; s/p CXR negative.

## 2013-05-24 NOTE — Progress Notes (Signed)
  Subjective:    Patient ID: Cristina [name], female    DOB: 07-30-1961, 52 y.o.   MRN: [id number]  HPI    Review of Systems  Constitutional: Positive for fatigue.  HENT: Negative.   Eyes: Negative.   Respiratory: Negative.   Cardiovascular: Negative.   Gastrointestinal: Positive for constipation and blood in stool.  Endocrine:       Patient checks blood sugar at home, readings are 126  Genitourinary: Negative.   Musculoskeletal: Positive for back pain and gait problem.  Skin: Negative.   Allergic/Immunologic: Negative.   Neurological: Negative.   Hematological: Negative.   Psychiatric/Behavioral: Positive for dysphoric mood.       Objective:   Physical Exam        Assessment & Plan:

## 2013-05-24 NOTE — Patient Instructions (Addendum)
1.  Call insurance regarding shingles vaccine (Zostavax) coverage.

## 2013-05-24 NOTE — Assessment & Plan Note (Signed)
Completed.  Pap smear and mammogram UTD.

## 2013-05-25 ENCOUNTER — Encounter: Payer: Self-pay | Admitting: Family Medicine

## 2013-05-25 LAB — VITAMIN D 25 HYDROXY (VIT D DEFICIENCY, FRACTURES): Vit D, 25-Hydroxy: 55 ng/mL (ref 30–89)

## 2013-05-26 ENCOUNTER — Encounter: Payer: Self-pay | Admitting: Family Medicine

## 2013-05-30 ENCOUNTER — Encounter: Payer: Self-pay | Admitting: Family Medicine

## 2013-06-01 ENCOUNTER — Ambulatory Visit (INDEPENDENT_AMBULATORY_CARE_PROVIDER_SITE_OTHER): Payer: Self-pay | Admitting: Family Medicine

## 2013-06-01 VITALS — BP 128/88 | Wt 235.0 lb

## 2013-06-01 DIAGNOSIS — E119 Type 2 diabetes mellitus without complications: Secondary | ICD-10-CM

## 2013-06-02 NOTE — Progress Notes (Signed)
Patient presents today for 3 month diabetes follow-up as part of the employer-sponsored Link to Wellness program. Current diabetes regimen includes Metformin. Patient also continues on daily ASA and statin. Most recent MD follow-up was this month. At this time requip was increased to 0.5 mg nightly and ambien was decreased to 5 mg nightly. No other med changes or major health changes at this time.   A1c 6.7 (05/24/13)  Diabetes Assessment: Type of Diabetes: Type 2; Sees Diabetes provider 4 or more times per year; MD managing Diabetes Loanne Drilling, MD; takes medications as prescribed; checks feet daily; uses glucometer; takes an aspirin a day Other Diabetes History: Current med regimen includes Metformin ER 500 mg two tablets twice daily. Patient does maintain good medication compliance. Patient did not bring meter today and is currently testing 1-2 times per day, with readings at 120 or less on most days. Hypoglycemia frequency is rare, occassionally dropping to 75 and this usually occurs only if patient does not have time to eat a mid-morning snack. Patient does/does not demonstrate appropriate correction of hypoglycemia. Patient denies signs and symptoms of neuropathy including numbness/tingling/burning and symptoms of foot infection. Patient is not due for yearly eye exam.  Lifestyle Factors: Diet - No significant changes at this time. Purchased a nutribullet and is attempting to replace two meals per day with a shake. Patient reports that she continues eating fast food because it is easy and inexpensive. She is aware of the need for healthier choices and wants to focus on this goal.  Exercise - Not exercising consistently at this time. But started exercising last week and was able to do 30 min 4 days last week on her stationary bike. Will attempt to continue this schedule.   Assessment: Patient continues to maintain fair control of DM but continues to have room for improvement in both diet and  exercise. Patient has improved glucose monitoring and is now testing daily. Patient has had a 10 lb weight gain since last visit, but expected this given her diet and exercise has been less than optimal. Patient seems motivated at this visit to make changes to diet and exercise and I am hopeful she will be able to maintain these.   Plan: 1) Focus on limiting fast food 2) Attempt to continue exercising at least 3 times per week, great job with starting this! 3) Attempt a 5 lb weight loss goal 4) Follow-up in 3 months

## 2013-06-05 ENCOUNTER — Ambulatory Visit (INDEPENDENT_AMBULATORY_CARE_PROVIDER_SITE_OTHER): Payer: 59 | Admitting: Family Medicine

## 2013-06-05 ENCOUNTER — Encounter: Payer: Self-pay | Admitting: Family Medicine

## 2013-06-05 ENCOUNTER — Ambulatory Visit: Payer: 59

## 2013-06-05 VITALS — BP 131/85 | HR 91 | Temp 98.0°F | Resp 18 | Ht 60.0 in | Wt 230.0 lb

## 2013-06-05 DIAGNOSIS — M549 Dorsalgia, unspecified: Secondary | ICD-10-CM

## 2013-06-05 DIAGNOSIS — S29012A Strain of muscle and tendon of back wall of thorax, initial encounter: Secondary | ICD-10-CM

## 2013-06-05 DIAGNOSIS — S139XXA Sprain of joints and ligaments of unspecified parts of neck, initial encounter: Secondary | ICD-10-CM

## 2013-06-05 DIAGNOSIS — S335XXA Sprain of ligaments of lumbar spine, initial encounter: Secondary | ICD-10-CM

## 2013-06-05 DIAGNOSIS — S233XXA Sprain of ligaments of thoracic spine, initial encounter: Secondary | ICD-10-CM

## 2013-06-05 DIAGNOSIS — M542 Cervicalgia: Secondary | ICD-10-CM

## 2013-06-05 NOTE — Patient Instructions (Addendum)
1.  IBUPROFEN 200MG  3-4 TABLETS THREE TIMES DAILY WITH FOOD. 2.  CYCLOBENZAPRINE/FLEXERIL  5MG   ONE PILL THREE TIMES DAILY 3.  USE ICE FOR THE NEXT 24 HOURS AND THEN SWITCH TO HEAT. 4.  RECOMMEND FREQUENT AMBULATION; AVOID PROLONGED SITTING.  Low Back Sprain with Rehab  A sprain is an injury in which a ligament is torn. The ligaments of the lower back are vulnerable to sprains. However, they are strong and require great force to be injured. These ligaments are important for stabilizing the spinal column. Sprains are classified into three categories. Grade 1 sprains cause pain, but the tendon is not lengthened. Grade 2 sprains include a lengthened ligament, due to the ligament being stretched or partially ruptured. With grade 2 sprains there is still function, although the function may be decreased. Grade 3 sprains involve a complete tear of the tendon or muscle, and function is usually impaired. SYMPTOMS   Severe pain in the lower back.  Sometimes, a feeling of a "pop," "snap," or tear, at the time of injury.  Tenderness and sometimes swelling at the injury site.  Uncommonly, bruising (contusion) within 48 hours of injury.  Muscle spasms in the back. CAUSES  Low back sprains occur when a force is placed on the ligaments that is greater than they can handle. Common causes of injury include:  Performing a stressful act while off-balance.  Repetitive stressful activities that involve movement of the lower back.  Direct hit (trauma) to the lower back. RISK INCREASES WITH:  Contact sports (football, wrestling).  Collisions (major skiing accidents).  Sports that require throwing or lifting (baseball, weightlifting).  Sports involving twisting of the spine (gymnastics, diving, tennis, golf).  Poor strength and flexibility.  Inadequate protection.  Previous back injury or surgery (especially fusion). PREVENTION  Wear properly fitted and padded protective equipment.  Warm up and  stretch properly before activity.  Allow for adequate recovery between workouts.  Maintain physical fitness:  Strength, flexibility, and endurance.  Cardiovascular fitness.  Maintain a healthy body weight. PROGNOSIS  If treated properly, low back sprains usually heal with non-surgical treatment. The length of time for healing depends on the severity of the injury.  RELATED COMPLICATIONS   Recurring symptoms, resulting in a chronic problem.  Chronic inflammation and pain in the low back.  Delayed healing or resolution of symptoms, especially if activity is resumed too soon.  Prolonged impairment.  Unstable or arthritic joints of the low back. TREATMENT  Treatment first involves the use of ice and medicine, to reduce pain and inflammation. The use of strengthening and stretching exercises may help reduce pain with activity. These exercises may be performed at home or with a therapist. Severe injuries may require referral to a therapist for further evaluation and treatment, such as ultrasound. Your caregiver may advise that you wear a back brace or corset, to help reduce pain and discomfort. Often, prolonged bed rest results in greater harm then benefit. Corticosteroid injections may be recommended. However, these should be reserved for the most serious cases. It is important to avoid using your back when lifting objects. At night, sleep on your back on a firm mattress, with a pillow placed under your knees. If non-surgical treatment is unsuccessful, surgery may be needed.  MEDICATION   If pain medicine is needed, nonsteroidal anti-inflammatory medicines (aspirin and ibuprofen), or other minor pain relievers (acetaminophen), are often advised.  Do not take pain medicine for 7 days before surgery.  Prescription pain relievers may be given, if  your caregiver thinks they are needed. Use only as directed and only as much as you need.  Ointments applied to the skin may be  helpful.  Corticosteroid injections may be given by your caregiver. These injections should be reserved for the most serious cases, because they may only be given a certain number of times. HEAT AND COLD  Cold treatment (icing) should be applied for 10 to 15 minutes every 2 to 3 hours for inflammation and pain, and immediately after activity that aggravates your symptoms. Use ice packs or an ice massage.  Heat treatment may be used before performing stretching and strengthening activities prescribed by your caregiver, physical therapist, or athletic trainer. Use a heat pack or a warm water soak. SEEK MEDICAL CARE IF:   Symptoms get worse or do not improve in 2 to 4 weeks, despite treatment.  You develop numbness or weakness in either leg.  You lose bowel or bladder function.  Any of the following occur after surgery: fever, increased pain, swelling, redness, drainage of fluids, or bleeding in the affected area.  New, unexplained symptoms develop. (Drugs used in treatment may produce side effects.) EXERCISES  RANGE OF MOTION (ROM) AND STRETCHING EXERCISES - Low Back Sprain Most people with lower back pain will find that their symptoms get worse with excessive bending forward (flexion) or arching at the lower back (extension). The exercises that will help resolve your symptoms will focus on the opposite motion.  Your physician, physical therapist or athletic trainer will help you determine which exercises will be most helpful to resolve your lower back pain. Do not complete any exercises without first consulting with your caregiver. Discontinue any exercises which make your symptoms worse, until you speak to your caregiver. If you have pain, numbness or tingling which travels down into your buttocks, leg or foot, the goal of the therapy is for these symptoms to move closer to your back and eventually resolve. Sometimes, these leg symptoms will get better, but your lower back pain may worsen.  This is often an indication of progress in your rehabilitation. Be very alert to any changes in your symptoms and the activities in which you participated in the 24 hours prior to the change. Sharing this information with your caregiver will allow him or her to most efficiently treat your condition. These exercises may help you when beginning to rehabilitate your injury. Your symptoms may resolve with or without further involvement from your physician, physical therapist or athletic trainer. While completing these exercises, remember:   Restoring tissue flexibility helps normal motion to return to the joints. This allows healthier, less painful movement and activity.  An effective stretch should be held for at least 30 seconds.  A stretch should never be painful. You should only feel a gentle lengthening or release in the stretched tissue. FLEXION RANGE OF MOTION AND STRETCHING EXERCISES: STRETCH  Flexion, Single Knee to Chest   Lie on a firm bed or floor with both legs extended in front of you.  Keeping one leg in contact with the floor, bring your opposite knee to your chest. Hold your leg in place by either grabbing behind your thigh or at your knee.  Pull until you feel a gentle stretch in your low back. Hold __________ seconds.  Slowly release your grasp and repeat the exercise with the opposite side. Repeat __________ times. Complete this exercise __________ times per day.  STRETCH  Flexion, Double Knee to Chest  Lie on a firm bed or floor  with both legs extended in front of you.  Keeping one leg in contact with the floor, bring your opposite knee to your chest.  Tense your stomach muscles to support your back and then lift your other knee to your chest. Hold your legs in place by either grabbing behind your thighs or at your knees.  Pull both knees toward your chest until you feel a gentle stretch in your low back. Hold __________ seconds.  Tense your stomach muscles and slowly  return one leg at a time to the floor. Repeat __________ times. Complete this exercise __________ times per day.  STRETCH  Low Trunk Rotation  Lie on a firm bed or floor. Keeping your legs in front of you, bend your knees so they are both pointed toward the ceiling and your feet are flat on the floor.  Extend your arms out to the side. This will stabilize your upper body by keeping your shoulders in contact with the floor.  Gently and slowly drop both knees together to one side until you feel a gentle stretch in your low back. Hold for __________ seconds.  Tense your stomach muscles to support your lower back as you bring your knees back to the starting position. Repeat the exercise to the other side. Repeat __________ times. Complete this exercise __________ times per day  EXTENSION RANGE OF MOTION AND FLEXIBILITY EXERCISES: STRETCH  Extension, Prone on Elbows   Lie on your stomach on the floor, a bed will be too soft. Place your palms about shoulder width apart and at the height of your head.  Place your elbows under your shoulders. If this is too painful, stack pillows under your chest.  Allow your body to relax so that your hips drop lower and make contact more completely with the floor.  Hold this position for __________ seconds.  Slowly return to lying flat on the floor. Repeat __________ times. Complete this exercise __________ times per day.  RANGE OF MOTION  Extension, Prone Press Ups  Lie on your stomach on the floor, a bed will be too soft. Place your palms about shoulder width apart and at the height of your head.  Keeping your back as relaxed as possible, slowly straighten your elbows while keeping your hips on the floor. You may adjust the placement of your hands to maximize your comfort. As you gain motion, your hands will come more underneath your shoulders.  Hold this position __________ seconds.  Slowly return to lying flat on the floor. Repeat __________ times.  Complete this exercise __________ times per day.  RANGE OF MOTION- Quadruped, Neutral Spine   Assume a hands and knees position on a firm surface. Keep your hands under your shoulders and your knees under your hips. You may place padding under your knees for comfort.  Drop your head and point your tailbone toward the ground below you. This will round out your lower back like an angry cat. Hold this position for __________ seconds.  Slowly lift your head and release your tail bone so that your back sags into a large arch, like an old horse.  Hold this position for __________ seconds.  Repeat this until you feel limber in your low back.  Now, find your "sweet spot." This will be the most comfortable position somewhere between the two previous positions. This is your neutral spine. Once you have found this position, tense your stomach muscles to support your low back.  Hold this position for __________ seconds. Repeat __________  times. Complete this exercise __________ times per day.  STRENGTHENING EXERCISES - Low Back Sprain These exercises may help you when beginning to rehabilitate your injury. These exercises should be done near your "sweet spot." This is the neutral, low-back arch, somewhere between fully rounded and fully arched, that is your least painful position. When performed in this safe range of motion, these exercises can be used for people who have either a flexion or extension based injury. These exercises may resolve your symptoms with or without further involvement from your physician, physical therapist or athletic trainer. While completing these exercises, remember:   Muscles can gain both the endurance and the strength needed for everyday activities through controlled exercises.  Complete these exercises as instructed by your physician, physical therapist or athletic trainer. Increase the resistance and repetitions only as guided.  You may experience muscle soreness or  fatigue, but the pain or discomfort you are trying to eliminate should never worsen during these exercises. If this pain does worsen, stop and make certain you are following the directions exactly. If the pain is still present after adjustments, discontinue the exercise until you can discuss the trouble with your caregiver. STRENGTHENING Deep Abdominals, Pelvic Tilt   Lie on a firm bed or floor. Keeping your legs in front of you, bend your knees so they are both pointed toward the ceiling and your feet are flat on the floor.  Tense your lower abdominal muscles to press your low back into the floor. This motion will rotate your pelvis so that your tail bone is scooping upwards rather than pointing at your feet or into the floor. With a gentle tension and even breathing, hold this position for __________ seconds. Repeat __________ times. Complete this exercise __________ times per day.  STRENGTHENING  Abdominals, Crunches   Lie on a firm bed or floor. Keeping your legs in front of you, bend your knees so they are both pointed toward the ceiling and your feet are flat on the floor. Cross your arms over your chest.  Slightly tip your chin down without bending your neck.  Tense your abdominals and slowly lift your trunk high enough to just clear your shoulder blades. Lifting higher can put excessive stress on the lower back and does not further strengthen your abdominal muscles.  Control your return to the starting position. Repeat __________ times. Complete this exercise __________ times per day.  STRENGTHENING  Quadruped, Opposite UE/LE Lift   Assume a hands and knees position on a firm surface. Keep your hands under your shoulders and your knees under your hips. You may place padding under your knees for comfort.  Find your neutral spine and gently tense your abdominal muscles so that you can maintain this position. Your shoulders and hips should form a rectangle that is parallel with the floor  and is not twisted.  Keeping your trunk steady, lift your right hand no higher than your shoulder and then your left leg no higher than your hip. Make sure you are not holding your breath. Hold this position for __________ seconds.  Continuing to keep your abdominal muscles tense and your back steady, slowly return to your starting position. Repeat with the opposite arm and leg. Repeat __________ times. Complete this exercise __________ times per day.  STRENGTHENING  Abdominals and Quadriceps, Straight Leg Raise   Lie on a firm bed or floor with both legs extended in front of you.  Keeping one leg in contact with the floor, bend the other  knee so that your foot can rest flat on the floor.  Find your neutral spine, and tense your abdominal muscles to maintain your spinal position throughout the exercise.  Slowly lift your straight leg off the floor about 6 inches for a count of 15, making sure to not hold your breath.  Still keeping your neutral spine, slowly lower your leg all the way to the floor. Repeat this exercise with each leg __________ times. Complete this exercise __________ times per day. POSTURE AND BODY MECHANICS CONSIDERATIONS - Low Back Sprain Keeping correct posture when sitting, standing or completing your activities will reduce the stress put on different body tissues, allowing injured tissues a chance to heal and limiting painful experiences. The following are general guidelines for improved posture. Your physician or physical therapist will provide you with any instructions specific to your needs. While reading these guidelines, remember:  The exercises prescribed by your provider will help you have the flexibility and strength to maintain correct postures.  The correct posture provides the best environment for your joints to work. All of your joints have less wear and tear when properly supported by a spine with good posture. This means you will experience a healthier, less  painful body.  Correct posture must be practiced with all of your activities, especially prolonged sitting and standing. Correct posture is as important when doing repetitive low-stress activities (typing) as it is when doing a single heavy-load activity (lifting). RESTING POSITIONS Consider which positions are most painful for you when choosing a resting position. If you have pain with flexion-based activities (sitting, bending, stooping, squatting), choose a position that allows you to rest in a less flexed posture. You would want to avoid curling into a fetal position on your side. If your pain worsens with extension-based activities (prolonged standing, working overhead), avoid resting in an extended position such as sleeping on your stomach. Most people will find more comfort when they rest with their spine in a more neutral position, neither too rounded nor too arched. Lying on a non-sagging bed on your side with a pillow between your knees, or on your back with a pillow under your knees will often provide some relief. Keep in mind, being in any one position for a prolonged period of time, no matter how correct your posture, can still lead to stiffness. PROPER SITTING POSTURE In order to minimize stress and discomfort on your spine, you must sit with correct posture. Sitting with good posture should be effortless for a healthy body. Returning to good posture is a gradual process. Many people can work toward this most comfortably by using various supports until they have the flexibility and strength to maintain this posture on their own. When sitting with proper posture, your ears will fall over your shoulders and your shoulders will fall over your hips. You should use the back of the chair to support your upper back. Your lower back will be in a neutral position, just slightly arched. You may place a small pillow or folded towel at the base of your lower back for  support.  When working at a desk,  create an environment that supports good, upright posture. Without extra support, muscles tire, which leads to excessive strain on joints and other tissues. Keep these recommendations in mind: CHAIR:  A chair should be able to slide under your desk when your back makes contact with the back of the chair. This allows you to work closely.  The chair's height should allow your  eyes to be level with the upper part of your monitor and your hands to be slightly lower than your elbows. BODY POSITION  Your feet should make contact with the floor. If this is not possible, use a foot rest.  Keep your ears over your shoulders. This will reduce stress on your neck and low back. INCORRECT SITTING POSTURES  If you are feeling tired and unable to assume a healthy sitting posture, do not slouch or slump. This puts excessive strain on your back tissues, causing more damage and pain. Healthier options include:  Using more support, like a lumbar pillow.  Switching tasks to something that requires you to be upright or walking.  Talking a brief walk.  Lying down to rest in a neutral-spine position. PROLONGED STANDING WHILE SLIGHTLY LEANING FORWARD  When completing a task that requires you to lean forward while standing in one place for a long time, place either foot up on a stationary 2-4 inch high object to help maintain the best posture. When both feet are on the ground, the lower back tends to lose its slight inward curve. If this curve flattens (or becomes too large), then the back and your other joints will experience too much stress, tire more quickly, and can cause pain. CORRECT STANDING POSTURES Proper standing posture should be assumed with all daily activities, even if they only take a few moments, like when brushing your teeth. As in sitting, your ears should fall over your shoulders and your shoulders should fall over your hips. You should keep a slight tension in your abdominal muscles to brace your  spine. Your tailbone should point down to the ground, not behind your body, resulting in an over-extended swayback posture.  INCORRECT STANDING POSTURES  Common incorrect standing postures include a forward head, locked knees and/or an excessive swayback. WALKING Walk with an upright posture. Your ears, shoulders and hips should all line-up. PROLONGED ACTIVITY IN A FLEXED POSITION When completing a task that requires you to bend forward at your waist or lean over a low surface, try to find a way to stabilize 3 out of 4 of your limbs. You can place a hand or elbow on your thigh or rest a knee on the surface you are reaching across. This will provide you more stability, so that your muscles do not tire as quickly. By keeping your knees relaxed, or slightly bent, you will also reduce stress across your lower back. CORRECT LIFTING TECHNIQUES DO :  Assume a wide stance. This will provide you more stability and the opportunity to get as close as possible to the object which you are lifting.  Tense your abdominals to brace your spine. Bend at the knees and hips. Keeping your back locked in a neutral-spine position, lift using your leg muscles. Lift with your legs, keeping your back straight.  Test the weight of unknown objects before attempting to lift them.  Try to keep your elbows locked down at your sides in order get the best strength from your shoulders when carrying an object.  Always ask for help when lifting heavy or awkward objects. INCORRECT LIFTING TECHNIQUES DO NOT:   Lock your knees when lifting, even if it is a small object.  Bend and twist. Pivot at your feet or move your feet when needing to change directions.  Assume that you can safely pick up even a paperclip without proper posture. Document Released: 10/28/2005 Document Revised: 01/20/2012 Document Reviewed: 02/09/2009 [facility] Patient Information 2014 [place], [place].
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
## 2013-06-07 ENCOUNTER — Telehealth: Payer: Self-pay

## 2013-06-07 MED ORDER — HYDROCODONE-ACETAMINOPHEN 5-325 MG PO TABS: 1.0000 | ORAL_TABLET | Freq: Four times a day (QID) | ORAL | Status: DC | PRN

## 2013-06-07 NOTE — Telephone Encounter (Signed)
RX for Norco 5/325 mg per tablet faxed to [facility] per Dr. Katrinka Blazing.

## 2013-06-08 ENCOUNTER — Encounter: Payer: Self-pay | Admitting: Family Medicine

## 2013-06-09 ENCOUNTER — Other Ambulatory Visit: Payer: Self-pay

## 2013-06-09 MED ORDER — LANCETS MISC: Status: DC

## 2013-06-09 MED ORDER — GLUCOSE BLOOD VI STRP: ORAL_STRIP | Status: DC

## 2013-06-10 ENCOUNTER — Encounter: Payer: Self-pay | Admitting: Family Medicine

## 2013-06-15 ENCOUNTER — Encounter: Payer: Self-pay | Admitting: Family Medicine

## 2013-06-15 NOTE — Progress Notes (Signed)
x

## 2013-06-15 NOTE — Telephone Encounter (Signed)
Dr. Tamala Julian,    Patient is looking for her FL[employer] papers.  States she dropped them off with you at last OV.   [phone]

## 2013-06-15 NOTE — Progress Notes (Signed)
Patient ID: Cristina [name], female   DOB: Apr 04, 1961, 52 y.o.   MRN: [id number] ATTENDING PHYSICIAN NOTE: I have reviewed the chart and agree with the plan as detailed above. Dorcas Mcmurray MD Pager [phone]

## 2013-06-16 ENCOUNTER — Encounter: Payer: Self-pay | Admitting: Family Medicine

## 2013-06-16 ENCOUNTER — Ambulatory Visit (INDEPENDENT_AMBULATORY_CARE_PROVIDER_SITE_OTHER): Payer: Self-pay | Admitting: Family Medicine

## 2013-06-16 ENCOUNTER — Ambulatory Visit: Payer: Self-pay | Admitting: Emergency Medicine

## 2013-06-16 VITALS — BP 114/75 | HR 86 | Ht 60.0 in | Wt 225.0 lb

## 2013-06-16 DIAGNOSIS — S161XXA Strain of muscle, fascia and tendon at neck level, initial encounter: Secondary | ICD-10-CM

## 2013-06-16 DIAGNOSIS — M545 Low back pain, unspecified: Secondary | ICD-10-CM

## 2013-06-16 DIAGNOSIS — S139XXA Sprain of joints and ligaments of unspecified parts of neck, initial encounter: Secondary | ICD-10-CM

## 2013-06-16 MED ORDER — MELOXICAM 15 MG PO TABS: 15.0000 mg | ORAL_TABLET | Freq: Every day | ORAL | Status: DC

## 2013-06-16 MED ORDER — PREDNISONE (PAK) 10 MG PO TABS: ORAL_TABLET | ORAL | Status: DC

## 2013-06-16 MED ORDER — CYCLOBENZAPRINE HCL 10 MG PO TABS: 10.0000 mg | ORAL_TABLET | Freq: Three times a day (TID) | ORAL | Status: DC | PRN

## 2013-06-16 NOTE — Patient Instructions (Addendum)
You have muscle strains in your back, possibly radiculopathy (irritated nerve from disc herniation) in your low back. Start with prednisone dose pack x 6 days. Day after finishing this start meloxicam 15 mg daily with food for pain and inflammation. Consider norco as needed for severe pain (no driving on this medicine). Flexeril as needed for muscle spasms (no driving on this medicine if it makes you sleepy). Stay as active as possible. Physical therapy has been shown to be helpful as well - start this at [facility] location. Strengthening of low back muscles, abdominal musculature are key for long term pain relief. If not improving, will consider further imaging (MRI). Follow up with me in 1 month.

## 2013-06-18 ENCOUNTER — Telehealth: Payer: Self-pay

## 2013-06-18 MED ORDER — ZOLPIDEM TARTRATE 5 MG PO TABS: 5.0000 mg | ORAL_TABLET | Freq: Every evening | ORAL | Status: DC | PRN

## 2013-06-18 NOTE — Telephone Encounter (Signed)
Patient calling to check the status of her forms FMLA that she gave to Dr. Tamala Julian directly during her visit. Please call when ready. Patient has called several times already for this but it has not been documented by staff.  [phone]

## 2013-06-21 ENCOUNTER — Encounter: Payer: Self-pay | Admitting: Family Medicine

## 2013-06-21 DIAGNOSIS — M545 Low back pain, unspecified: Secondary | ICD-10-CM | POA: Insufficient documentation

## 2013-06-21 DIAGNOSIS — S161XXA Strain of muscle, fascia and tendon at neck level, initial encounter: Secondary | ICD-10-CM | POA: Insufficient documentation

## 2013-06-21 NOTE — Telephone Encounter (Signed)
Call --- FMLA paperwork ready for pick up.
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
## 2013-06-21 NOTE — Assessment & Plan Note (Signed)
Lumbar strain - less likely radiculopathy with a disc herniation.  Both treated similarly.  Start with prednisone dose pack, transition to meloxicam.  Flexeril for spasms.  Start formal PT and HEP.  F/u in 1 month for reevaluation.  Consider MRI if not improving.

## 2013-06-21 NOTE — Assessment & Plan Note (Signed)
Start with prednisone dose pack (mostly for possible radiculopathic component of low back pain), transition to meloxicam.  Flexeril for spasms.  Start formal PT and HEP.  F/u in 1 month for reevaluation.  Consider MRI if not improving.

## 2013-06-21 NOTE — Telephone Encounter (Signed)
Have you completed the forms?

## 2013-06-21 NOTE — Telephone Encounter (Signed)
Called patient to advise left message.

## 2013-06-22 ENCOUNTER — Telehealth: Payer: Self-pay

## 2013-06-22 ENCOUNTER — Encounter: Payer: Self-pay | Admitting: Family Medicine

## 2013-06-22 NOTE — Telephone Encounter (Signed)
Thanks faxed them to her, have given copies to [employer] to charge her and to scan.

## 2013-06-22 NOTE — Telephone Encounter (Signed)
PT STATES WE CALLED TO LET HER KNOW HER FMLA PAPERS WERE READY AND SHE WOULD LIKE [name] TO FAX THEM, HOWEVER THEY WEREN'T IN EITHER P/U DRAWER AT [address] OR [address] THE DISABILITY YOUNG LADIES DON'T KNOW ANYTHING ABOUT THEM. PLEASE CALL [phone] AND THE FAX IS [phone] TO HER ATTN

## 2013-06-24 ENCOUNTER — Ambulatory Visit: Payer: 59 | Attending: Family Medicine

## 2013-06-24 DIAGNOSIS — M549 Dorsalgia, unspecified: Secondary | ICD-10-CM | POA: Insufficient documentation

## 2013-06-24 DIAGNOSIS — M545 Low back pain, unspecified: Secondary | ICD-10-CM | POA: Insufficient documentation

## 2013-06-24 DIAGNOSIS — R51 Headache: Secondary | ICD-10-CM | POA: Insufficient documentation

## 2013-06-24 DIAGNOSIS — M6281 Muscle weakness (generalized): Secondary | ICD-10-CM | POA: Insufficient documentation

## 2013-06-24 DIAGNOSIS — R293 Abnormal posture: Secondary | ICD-10-CM | POA: Insufficient documentation

## 2013-06-24 DIAGNOSIS — IMO0001 Reserved for inherently not codable concepts without codable children: Secondary | ICD-10-CM | POA: Insufficient documentation

## 2013-06-25 ENCOUNTER — Ambulatory Visit: Payer: Self-pay | Admitting: Family Medicine

## 2013-06-28 ENCOUNTER — Ambulatory Visit: Payer: 59 | Admitting: Rehabilitation

## 2013-06-29 ENCOUNTER — Encounter: Payer: 59 | Admitting: Rehabilitation

## 2013-07-01 ENCOUNTER — Ambulatory Visit: Payer: 59 | Admitting: Rehabilitation

## 2013-07-06 ENCOUNTER — Ambulatory Visit: Payer: 59 | Admitting: Physical Therapy

## 2013-07-08 ENCOUNTER — Encounter: Payer: 59 | Admitting: Physical Therapy

## 2013-07-14 ENCOUNTER — Ambulatory Visit: Payer: 59 | Attending: Family Medicine | Admitting: Rehabilitation

## 2013-07-14 DIAGNOSIS — M545 Low back pain, unspecified: Secondary | ICD-10-CM | POA: Insufficient documentation

## 2013-07-14 DIAGNOSIS — M6281 Muscle weakness (generalized): Secondary | ICD-10-CM | POA: Insufficient documentation

## 2013-07-14 DIAGNOSIS — R51 Headache: Secondary | ICD-10-CM | POA: Insufficient documentation

## 2013-07-14 DIAGNOSIS — IMO0001 Reserved for inherently not codable concepts without codable children: Secondary | ICD-10-CM | POA: Insufficient documentation

## 2013-07-14 DIAGNOSIS — M549 Dorsalgia, unspecified: Secondary | ICD-10-CM | POA: Insufficient documentation

## 2013-07-14 DIAGNOSIS — R293 Abnormal posture: Secondary | ICD-10-CM | POA: Insufficient documentation

## 2013-07-15 ENCOUNTER — Ambulatory Visit: Payer: 59

## 2013-07-15 ENCOUNTER — Ambulatory Visit (INDEPENDENT_AMBULATORY_CARE_PROVIDER_SITE_OTHER): Payer: 59 | Admitting: Family Medicine

## 2013-07-15 VITALS — BP 128/72 | HR 97 | Temp 98.5°F | Resp 18 | Ht 60.0 in | Wt 234.6 lb

## 2013-07-15 DIAGNOSIS — J309 Allergic rhinitis, unspecified: Secondary | ICD-10-CM

## 2013-07-15 DIAGNOSIS — H9209 Otalgia, unspecified ear: Secondary | ICD-10-CM

## 2013-07-15 DIAGNOSIS — H9202 Otalgia, left ear: Secondary | ICD-10-CM

## 2013-07-15 DIAGNOSIS — H6990 Unspecified Eustachian tube disorder, unspecified ear: Secondary | ICD-10-CM

## 2013-07-15 DIAGNOSIS — H6980 Other specified disorders of Eustachian tube, unspecified ear: Secondary | ICD-10-CM

## 2013-07-15 DIAGNOSIS — H6982 Other specified disorders of Eustachian tube, left ear: Secondary | ICD-10-CM

## 2013-07-15 MED ORDER — AMOXICILLIN 500 MG PO CAPS: 1000.0000 mg | ORAL_CAPSULE | Freq: Two times a day (BID) | ORAL | Status: DC

## 2013-07-15 NOTE — Progress Notes (Signed)
[place], [place]  [address]   [phone]  Subjective:    Patient ID: Cristina [name], female    DOB: 05-17-1961, 52 y.o.   MRN: [id number]  HPI This 52 y.o. female presents for evaluation of L ear pain.  Onset one week ago.  No fever/chills/sweats.  No sore throat.  No rhinorrhea/nasal congestion/PND.  No cough.  Pain in ear; normal hearing; stopped up in ear; muffled.  Pops with chewing.  Not sure if grinds teeth at night. No blurred vision.  No fatigue with chewing.  Review of Systems  Constitutional: Negative for fever, chills, diaphoresis and fatigue.  HENT: Positive for ear pain. Negative for congestion, dental problem, ear discharge, facial swelling, hearing loss, mouth sores, postnasal drip, rhinorrhea, sinus pressure, sneezing, sore throat, trouble swallowing and voice change.   Eyes: Negative for visual disturbance.  Respiratory: Negative for cough.   Neurological: Negative for headaches.   Past Medical History  Diagnosis Date  . Hemorrhoid   . Fissure, anal   . Fibroids     uterine  . Cystocele   . Rectocele   . Diabetes mellitus   . Genital herpes   . Arthritis   . Depression   . Hyperlipidemia   . Obesity   . GERD (gastroesophageal reflux disease)   . CTS (carpal tunnel syndrome)   . EP (ectopic pregnancy) 1989  . Hx of migraine headaches   . Dysfunction of eustachian tube   . Constipation   . Tobacco use disorder   . Allergic rhinitis, cause unspecified   . Iron deficiency anemia, unspecified   . Insomnia    Past Surgical History  Procedure Laterality Date  . Cholecystectomy    . Carpal tunnel release  1989    Left  . Ectopic pregnancy surgery  1993  . Tubal ligation  1988  . Cholecystectomy    . Sleep study  09/11/2012    severe OSA.  CPAP titration at 12 cm water pressure.  . Abdominal hysterectomy  11/11/2010    Marvel Plan.  Ovaries intact.  Uterine fibroids with DUB.  . Colonoscopy  06/29/2012    normal.  Repeat in 5 years.  .  Esophagogastroduodenoscopy  12/13/2011    dysphagia.  Henrene Pastor.  Normal.   Allergies  Allergen Reactions  . Fish Oil Rash   Current Outpatient Prescriptions on File Prior to Visit  Medication Sig Dispense Refill  . albuterol (PROAIR HFA) 108 (90 BASE) MCG/ACT inhaler Inhale 2 puffs into the lungs every 6 (six) hours as needed.  18 g  1  . aspirin EC 81 MG tablet Take 81 mg by mouth daily.      [name] azelastine (ASTELIN) 137 MCG/SPRAY nasal spray Place 2 sprays into the nose 2 (two) times daily. Use in each nostril as directed  30 mL  6  . buPROPion (WELLBUTRIN SR) 100 MG 12 hr tablet Take 1 tablet (100 mg total) by mouth 2 (two) times daily.  180 tablet  3  . Calcium Carbonate-Vitamin D (CALCIUM + D PO) Take 1 tablet by mouth daily.      . cetirizine (ZYRTEC) 10 MG tablet Take 10 mg by mouth daily.      . ferrous sulfate 325 (65 FE) MG EC tablet Take 325 mg by mouth daily.       . fluticasone (FLONASE) 50 MCG/ACT nasal spray Place 2 sprays into the nose daily.  16 g  11  . Glucosamine HCl 1000 MG  TABS Take 1,000 mg by mouth 3 (three) times daily.      [name] glucose blood test strip USE TO CHECK BLOOD SUGAR DAILY. DX CODE: 250.00  100 each  3  . hydrocortisone (ANUSOL-HC) 25 MG suppository Place 1 suppository (25 mg total) rectally 2 (two) times daily as needed for hemorrhoids.  25 suppository  5  . ibuprofen (ADVIL,MOTRIN) 600 MG tablet Take 600 mg by mouth every 6 (six) hours as needed.      . Lancets MISC Use to check blood sugar daily. Dx code: 250.00  100 each  3  . meloxicam (MOBIC) 15 MG tablet Take 1 tablet (15 mg total) by mouth daily. With food.  Start day AFTER finishing prednisone.  30 tablet  1  . Multiple Vitamin (MULTIVITAMIN) capsule Take 1 capsule by mouth daily.      [name] rOPINIRole (REQUIP) 0.5 MG tablet Take 1 tablet (0.5 mg total) by mouth at bedtime.  30 tablet  5  . senna (SENOKOT) 8.6 MG tablet Take 1 tablet by mouth daily.      . sertraline (ZOLOFT) 100 MG tablet Take 1 tablet (100  mg total) by mouth daily.  90 tablet  3  . simvastatin (ZOCOR) 40 MG tablet Take 1 tablet (40 mg total) by mouth every evening.  90 tablet  3  . zolpidem (AMBIEN) 5 MG tablet Take 1 tablet (5 mg total) by mouth at bedtime as needed.  30 tablet  5  . cyclobenzaprine (FLEXERIL) 10 MG tablet Take 1 tablet (10 mg total) by mouth every 8 (eight) hours as needed for muscle spasms.  90 tablet  0  . HYDROcodone-acetaminophen (NORCO/VICODIN) 5-325 MG per tablet Take 1 tablet by mouth every 6 (six) hours as needed for pain.  40 tablet  0  . pantoprazole (PROTONIX) 40 MG tablet Take 1 tablet (40 mg total) by mouth 2 (two) times daily.  180 tablet  3  . predniSONE (STERAPRED UNI-PAK) 10 MG tablet 6 tabs po day 1, 5 tabs po day 2, 4 tabs po day 3, 3 tabs po day 4, 2 tabs po day 5, 1 tab po day 6  21 tablet  0   No current facility-administered medications on file prior to visit.       Objective:   Physical Exam  Nursing note and vitals reviewed. Constitutional: She is oriented to person, place, and time. She appears well-developed and well-nourished. No distress.  HENT:  Head: Normocephalic and atraumatic.  Right Ear: External ear normal.  Left Ear: External ear normal.  Nose: Nose normal.  Mouth/Throat: Oropharynx is clear and moist. No oropharyngeal exudate.  Eyes: Conjunctivae and EOM are normal. Pupils are equal, round, and reactive to light.  Neck: Normal range of motion. Neck supple.  Cardiovascular: Normal rate, regular rhythm and normal heart sounds.   Pulmonary/Chest: Effort normal and breath sounds normal.  Lymphadenopathy:    She has no cervical adenopathy.  Neurological: She is alert and oriented to person, place, and time.  Skin: Skin is warm and dry. No rash noted. She is not diaphoretic.  Psychiatric: She has a normal mood and affect. Her behavior is normal. Judgment and thought content normal.       Assessment & Plan:  Otalgia, left  Eustachian tube dysfunction, left  1. L  otalgia:  New. Benign exam.  Consistent with ETD versus TMJ.  Recommend Ibuprofen 830m tid PRN. 2.  L eustachian tube dysfunction:  New.  Treat with oral antihistamine, fluticasone,  astelin, Ibuprofen. If worsens or persists, start Amoxicillin.   No evidence of otitis media at this time; pt expressed understanding.  Meds ordered this encounter  Medications  . amoxicillin (AMOXIL) 500 MG capsule    Sig: Take 2 capsules (1,000 mg total) by mouth 2 (two) times daily.    Dispense:  40 capsule    Refill:  0

## 2013-07-15 NOTE — Patient Instructions (Addendum)
1.  RESTART ASTELIN NASAL SPRAY TWICE DAILY. 2.  CONTINUE FLONASE, ZYRTEC. 3.  ADD PSEUDOEPHEDRINE FOR THE NEXT FIVE DAYS. 4.  IF NO IMPROVEMENT IN INE 5 DAYS, START ANTIBIOTIC. 5. AVOID CRUNCHY FOODS (CARROTS, ICE) FOR THE NEXT WEEK.

## 2013-07-19 ENCOUNTER — Ambulatory Visit (INDEPENDENT_AMBULATORY_CARE_PROVIDER_SITE_OTHER): Payer: Self-pay | Admitting: Family Medicine

## 2013-07-19 ENCOUNTER — Encounter: Payer: Self-pay | Admitting: Family Medicine

## 2013-07-19 VITALS — BP 117/80 | HR 88 | Ht 60.0 in | Wt 234.0 lb

## 2013-07-19 DIAGNOSIS — M545 Low back pain, unspecified: Secondary | ICD-10-CM

## 2013-07-19 DIAGNOSIS — S161XXD Strain of muscle, fascia and tendon at neck level, subsequent encounter: Secondary | ICD-10-CM

## 2013-07-19 DIAGNOSIS — Z5189 Encounter for other specified aftercare: Secondary | ICD-10-CM

## 2013-07-19 NOTE — Patient Instructions (Addendum)
Keep taking mobic and flexeril as needed as you have been. Continue physical therapy and home exercises. Continue current restrictions - follow up with me in 6 weeks for reevaluation. Anticipate releasing you to full duty at that time.

## 2013-07-20 ENCOUNTER — Encounter: Payer: Self-pay | Admitting: Family Medicine

## 2013-07-20 ENCOUNTER — Ambulatory Visit: Payer: 59 | Admitting: Physical Therapy
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
## 2013-07-20 NOTE — Assessment & Plan Note (Signed)
Lumbar strain - significantly improved.  Mobic, flexeril as needed.  Continue home exercises.

## 2013-07-20 NOTE — Assessment & Plan Note (Signed)
Improved but still some pain.  Continue with PT, home exercises.  Mobic, flexeril as needed.  F/u in 6 weeks.  Continue current restrictions at work until then.

## 2013-07-21 ENCOUNTER — Encounter: Payer: Self-pay | Admitting: Family Medicine

## 2013-07-21 ENCOUNTER — Other Ambulatory Visit: Payer: Self-pay | Admitting: Family Medicine

## 2013-07-22 ENCOUNTER — Other Ambulatory Visit: Payer: Self-pay | Admitting: Physician Assistant

## 2013-07-22 ENCOUNTER — Ambulatory Visit: Payer: 59 | Admitting: Rehabilitation

## 2013-07-22 ENCOUNTER — Other Ambulatory Visit: Payer: Self-pay | Admitting: Family Medicine

## 2013-07-22 MED ORDER — METFORMIN HCL 500 MG PO TABS: 1000.0000 mg | ORAL_TABLET | Freq: Two times a day (BID) | ORAL | Status: DC

## 2013-07-22 MED ORDER — ALPRAZOLAM 0.5 MG PO TABS: 0.5000 mg | ORAL_TABLET | Freq: Every evening | ORAL | Status: DC | PRN

## 2013-07-22 MED ORDER — ACYCLOVIR 400 MG PO TABS: 400.0000 mg | ORAL_TABLET | Freq: Two times a day (BID) | ORAL | Status: DC

## 2013-07-27 ENCOUNTER — Ambulatory Visit: Payer: 59 | Admitting: Rehabilitation

## 2013-07-28 ENCOUNTER — Ambulatory Visit: Payer: 59 | Admitting: Rehabilitation

## 2013-07-29 ENCOUNTER — Ambulatory Visit: Payer: 59 | Admitting: Physical Therapy

## 2013-08-02 ENCOUNTER — Encounter: Payer: Self-pay | Admitting: Family Medicine

## 2013-08-05 ENCOUNTER — Encounter: Payer: Self-pay | Admitting: Family Medicine

## 2013-08-05 ENCOUNTER — Ambulatory Visit (INDEPENDENT_AMBULATORY_CARE_PROVIDER_SITE_OTHER): Payer: 59 | Admitting: Family Medicine

## 2013-08-05 VITALS — BP 120/84 | HR 95 | Ht 61.0 in | Wt 234.0 lb

## 2013-08-05 DIAGNOSIS — S161XXD Strain of muscle, fascia and tendon at neck level, subsequent encounter: Secondary | ICD-10-CM

## 2013-08-05 DIAGNOSIS — Z5189 Encounter for other specified aftercare: Secondary | ICD-10-CM

## 2013-08-05 DIAGNOSIS — M545 Low back pain, unspecified: Secondary | ICD-10-CM

## 2013-08-06 ENCOUNTER — Encounter: Payer: Self-pay | Admitting: Family Medicine

## 2013-08-06 NOTE — Assessment & Plan Note (Signed)
Significantly better.  Continue with HEP.  Mobic, flexeril as needed.  May have intermittent issues with spasms, pain over next 3 months but should eventually resolve.  F/u prn.
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
## 2013-08-06 NOTE — Assessment & Plan Note (Signed)
Lumbar strain - was doing much better with PT, HEP and flared up over past couple days.  Likely flared up with stationary cycling on top of prior injury.  Also expect spasms, issues intermittently over next 3 months from her initial injury.  Mobic, flexeril as needed.  Continue home exercises.  F/u prn.

## 2013-08-09 ENCOUNTER — Telehealth: Payer: Self-pay

## 2013-08-09 NOTE — Telephone Encounter (Signed)
Patient called requesting an Office Note to be faxed to her regarding a previous visit. Please call patient at [phone].

## 2013-08-10 NOTE — Telephone Encounter (Signed)
LMOM for patient to call back and let us know what office note and to where?

## 2013-08-30 ENCOUNTER — Encounter: Payer: Self-pay | Admitting: Family Medicine

## 2013-08-30 ENCOUNTER — Ambulatory Visit: Payer: 59

## 2013-08-30 ENCOUNTER — Encounter: Payer: Self-pay | Admitting: Podiatry

## 2013-08-30 ENCOUNTER — Ambulatory Visit (INDEPENDENT_AMBULATORY_CARE_PROVIDER_SITE_OTHER): Payer: 59 | Admitting: Podiatry

## 2013-08-30 ENCOUNTER — Ambulatory Visit (INDEPENDENT_AMBULATORY_CARE_PROVIDER_SITE_OTHER): Payer: 59 | Admitting: Family Medicine

## 2013-08-30 ENCOUNTER — Ambulatory Visit: Payer: 59 | Admitting: Family Medicine

## 2013-08-30 VITALS — BP 126/82 | HR 79 | Temp 98.9°F | Resp 17 | Ht 62.0 in | Wt 242.0 lb

## 2013-08-30 VITALS — BP 142/90 | HR 94 | Resp 12 | Ht 61.0 in | Wt 242.0 lb

## 2013-08-30 DIAGNOSIS — M779 Enthesopathy, unspecified: Secondary | ICD-10-CM

## 2013-08-30 DIAGNOSIS — F3289 Other specified depressive episodes: Secondary | ICD-10-CM

## 2013-08-30 DIAGNOSIS — F329 Major depressive disorder, single episode, unspecified: Secondary | ICD-10-CM

## 2013-08-30 DIAGNOSIS — D509 Iron deficiency anemia, unspecified: Secondary | ICD-10-CM

## 2013-08-30 DIAGNOSIS — M79609 Pain in unspecified limb: Secondary | ICD-10-CM

## 2013-08-30 DIAGNOSIS — E7800 Pure hypercholesterolemia, unspecified: Secondary | ICD-10-CM

## 2013-08-30 DIAGNOSIS — E785 Hyperlipidemia, unspecified: Secondary | ICD-10-CM

## 2013-08-30 DIAGNOSIS — Z9989 Dependence on other enabling machines and devices: Secondary | ICD-10-CM

## 2013-08-30 DIAGNOSIS — F32A Depression, unspecified: Secondary | ICD-10-CM

## 2013-08-30 DIAGNOSIS — G4733 Obstructive sleep apnea (adult) (pediatric): Secondary | ICD-10-CM

## 2013-08-30 DIAGNOSIS — E119 Type 2 diabetes mellitus without complications: Secondary | ICD-10-CM

## 2013-08-30 LAB — COMPREHENSIVE METABOLIC PANEL
Alkaline Phosphatase: 79 U/L (ref 39–117)
BUN: 14 mg/dL (ref 6–23)
Creat: 0.63 mg/dL (ref 0.50–1.10)
Glucose, Bld: 139 mg/dL — ABNORMAL HIGH (ref 70–99)
Sodium: 140 mEq/L (ref 135–145)
Total Bilirubin: 0.3 mg/dL (ref 0.3–1.2)
Total Protein: 7.1 g/dL (ref 6.0–8.3)

## 2013-08-30 LAB — LIPID PANEL
Cholesterol: 175 mg/dL (ref 0–200)
HDL: 50 mg/dL (ref >39)
Total CHOL/HDL Ratio: 3.5 Ratio
Triglycerides: 194 mg/dL — ABNORMAL HIGH (ref <150)

## 2013-08-30 LAB — CBC WITH DIFFERENTIAL/PLATELET
Basophils Absolute: 0 10*3/uL (ref 0.0–0.1)
Basophils Relative: 0 % (ref 0–1)
Eosinophils Absolute: 0.2 10*3/uL (ref 0.0–0.7)
HCT: 36.2 % (ref 36.0–46.0)
MCHC: 32.9 g/dL (ref 30.0–36.0)
Monocytes Absolute: 0.6 10*3/uL (ref 0.1–1.0)
Neutro Abs: 5.9 10*3/uL (ref 1.7–7.7)
RDW: 15.6 % — ABNORMAL HIGH (ref 11.5–15.5)

## 2013-08-30 LAB — HEMOGLOBIN A1C: Mean Plasma Glucose: 174 mg/dL — ABNORMAL HIGH (ref <117)

## 2013-08-30 NOTE — Progress Notes (Signed)
  Subjective:    Patient ID: Cristina [name], female    DOB: [birth year], 52 y.o.   MRN: [id number]  Foot Pain   this 52 year old black female presents for dispensing of custom orthotics. She is a known diabetic with osteoarthritis, anemia, depression, irritable bowel syndrome and reflux   Review of Systems  Constitutional: Negative.   HENT: Positive for ear pain.   Eyes: Positive for visual disturbance.  Respiratory: Negative.   Cardiovascular: Negative.   Gastrointestinal: Negative.   Endocrine: Negative.   Genitourinary: Negative.   Musculoskeletal: Negative.   Skin: Negative.   Allergic/Immunologic: Negative.   Neurological: Negative.   Hematological: Negative.   Psychiatric/Behavioral: Negative.        Objective:   Physical Exam   Objective: The custom foot orthotics contour well to the right and left feet.       Assessment & Plan:  Assessment: Satisfactory fit of orthotics for treatment of metatarsalgia bilaterally.  Plan: Orthotics are dispensed with user instructions provided. Reappoint within 6 weeks.

## 2013-08-30 NOTE — Patient Instructions (Signed)
1.  Take Meloxicam daily for two weeks. 2.  Take Flexeril at bedtime for TMJ pain. 3. Follow-up with dentist this week. 4.  If hand pain no better in one month, call for ortho referral or follow-up with sports medicine.

## 2013-08-30 NOTE — Patient Instructions (Signed)

## 2013-08-30 NOTE — Progress Notes (Signed)
Subjective:    Patient ID: Cristina [name], female    DOB: 10/09/1961, 52 y.o.   MRN: [id number]  HPI "I'm ok I guess."  Taking mobic and flexeril occasionally. Through with sports medicine doctor. Bones hurting.   Left jaw hurts below ear. Saw dentist. Treated with amoxicillin and ibuprofen without relief. Questionable TMJ, infection. Following up with dentist tomorrow. Hearing ok.  Right thumb pain and second finger stiff and painful. Right thumb swells. Has carpel tunnel in right hand. This popping and swelling is different.Thumb and forefinger feel "hung up." Has tried wearing a band around it at work. Old injury.Asking for blood test for arthritis.  Neck still hurts off and on.  Blood sugars terrible (123 yest, 132 today, 146 previously, all fasting). Eating horribly, anything and everything. Hemorrhoids flare. Lost 1 pound since Friday, trying to get back in line.  Allergies "so, so." Getting better. Thinks it might be related to air fresheners.  Anemia- taking iron.  GERD- taking Protonix  Insomnia- sleeping ok with ambien. Sometimes hand pain interferes with sleep. Wears CPAP every night, all night.  Depression/anxiety- dry mouth improved with decreased zoloft. About 3 bad days a month. Family ok. Grandson stays with her most of the time.  Had flu shot Review of Systems No chest pain, no SOB No exercise    Objective:   Physical Exam  Nursing note and vitals reviewed. Constitutional: She is oriented to person, place, and time. She appears well-developed and well-nourished. No distress.  HENT:  Right Ear: Tympanic membrane, external ear and ear canal normal.  Left Ear: Tympanic membrane, external ear and ear canal normal.  Mouth/Throat: Oropharynx is clear and moist.  Eyes: Conjunctivae are normal. Right eye exhibits no discharge. Left eye exhibits no discharge.  Neck: Normal range of motion. Neck supple.  Cardiovascular: Normal rate, regular rhythm and normal heart  sounds.   Pulmonary/Chest: Effort normal and breath sounds normal.  Musculoskeletal: Normal range of motion. She exhibits tenderness. She exhibits no edema.  Lymphadenopathy:    She has no cervical adenopathy.  Neurological: She is alert and oriented to person, place, and time.  Skin: Skin is warm and dry.  Psychiatric: She has a normal mood and affect. Her behavior is normal. Judgment and thought content normal.   Right thumb with swelling, popping with movement, cyst on DIP.  [facility] reading (PRIMARY) by  Dr. Katrinka Blazing.  HAND FILM R:  NAD. MILD DEGENERATIVE CHANGES.     Assessment & Plan:  Type II or unspecified type diabetes mellitus without mention of complication, not stated as uncontrolled - Plan: CBC with Differential, CK, Comprehensive metabolic panel, Hemoglobin A1c, Lipid panel  Pure hypercholesterolemia - Plan: CBC with Differential, CK, Comprehensive metabolic panel, Hemoglobin A1c, Lipid panel  Pain, hand, right - Plan: DG Hand Complete Right  Depression  Iron deficiency anemia, unspecified  OSA on CPAP  Other and unspecified hyperlipidemia  1. DMII: worsening; obtain labs; adjust medication as indicated; encourage weight loss, exercise, dietary modification. 2.  Hypercholesterolemia: controlled; obtain labs; continue current medications. 3.  Pain R hand: New.  Recommend Mobic daily for two weeks; if no improvement in one month, recommend evaluation by ortho. 4.  Depression: moderately controlled; no change in medication at this time. 5. Iron deficiency anemia: stable; obtain labs; continue iron supplement.   6.  OSA on CPAP: stable; continue CPAP. Recommend weight loss.  No orders of the defined types were placed in this encounter.   Nilda Simmer, M.D.  Urgent  [facility] [address] [place], [place]  [address] [phone] phone [phone] fax

## 2013-09-02 ENCOUNTER — Encounter: Payer: Self-pay | Admitting: Family Medicine

## 2013-09-07 ENCOUNTER — Ambulatory Visit (INDEPENDENT_AMBULATORY_CARE_PROVIDER_SITE_OTHER): Payer: 59 | Admitting: Family Medicine

## 2013-09-07 VITALS — BP 136/94 | Wt 243.0 lb

## 2013-09-07 DIAGNOSIS — E119 Type 2 diabetes mellitus without complications: Secondary | ICD-10-CM
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
## 2013-09-16 ENCOUNTER — Other Ambulatory Visit: Payer: Self-pay

## 2013-10-18 ENCOUNTER — Ambulatory Visit: Payer: 59 | Admitting: Podiatry

## 2013-10-19 ENCOUNTER — Encounter: Payer: Self-pay | Admitting: Family Medicine

## 2013-10-26 ENCOUNTER — Ambulatory Visit: Payer: Self-pay

## 2013-10-26 ENCOUNTER — Ambulatory Visit (INDEPENDENT_AMBULATORY_CARE_PROVIDER_SITE_OTHER): Payer: Commercial Managed Care - PPO | Admitting: Family Medicine

## 2013-10-26 ENCOUNTER — Ambulatory Visit: Payer: 59

## 2013-10-26 VITALS — BP 130/92 | HR 110 | Temp 99.4°F | Resp 18 | Ht 61.0 in | Wt 245.0 lb

## 2013-10-26 DIAGNOSIS — M791 Myalgia, unspecified site: Secondary | ICD-10-CM

## 2013-10-26 DIAGNOSIS — R05 Cough: Secondary | ICD-10-CM

## 2013-10-26 DIAGNOSIS — R059 Cough, unspecified: Secondary | ICD-10-CM

## 2013-10-26 DIAGNOSIS — S9031XA Contusion of right foot, initial encounter: Secondary | ICD-10-CM

## 2013-10-26 DIAGNOSIS — M79671 Pain in right foot: Secondary | ICD-10-CM

## 2013-10-26 DIAGNOSIS — J111 Influenza due to unidentified influenza virus with other respiratory manifestations: Secondary | ICD-10-CM

## 2013-10-26 DIAGNOSIS — S9030XA Contusion of unspecified foot, initial encounter: Secondary | ICD-10-CM

## 2013-10-26 DIAGNOSIS — R509 Fever, unspecified: Secondary | ICD-10-CM

## 2013-10-26 DIAGNOSIS — M545 Low back pain, unspecified: Secondary | ICD-10-CM

## 2013-10-26 DIAGNOSIS — IMO0001 Reserved for inherently not codable concepts without codable children: Secondary | ICD-10-CM

## 2013-10-26 DIAGNOSIS — M79609 Pain in unspecified limb: Secondary | ICD-10-CM

## 2013-10-26 DIAGNOSIS — J45901 Unspecified asthma with (acute) exacerbation: Secondary | ICD-10-CM

## 2013-10-26 LAB — POCT CBC
Granulocyte percent: 65.4 %G (ref 37–80)
Lymph, poc: 2.6 (ref 0.6–3.4)
MCHC: 30.1 g/dL — AB (ref 31.8–35.4)
MID (cbc): 0.5 (ref 0–0.9)
POC Granulocyte: 5.8 (ref 2–6.9)
POC LYMPH PERCENT: 29.2 %L (ref 10–50)
Platelet Count, POC: 289 10*3/uL (ref 142–424)
RDW, POC: 16 %

## 2013-10-26 MED ORDER — HYDROCOD POLST-CHLORPHEN POLST 10-8 MG/5ML PO LQCR: 5.0000 mL | Freq: Two times a day (BID) | ORAL | Status: DC | PRN

## 2013-10-26 MED ORDER — IBUPROFEN 800 MG PO TABS: 800.0000 mg | ORAL_TABLET | Freq: Three times a day (TID) | ORAL | Status: DC | PRN

## 2013-10-26 MED ORDER — OSELTAMIVIR PHOSPHATE 75 MG PO CAPS: 75.0000 mg | ORAL_CAPSULE | Freq: Two times a day (BID) | ORAL | Status: DC

## 2013-10-26 MED ORDER — MELOXICAM 15 MG PO TABS: 15.0000 mg | ORAL_TABLET | Freq: Every day | ORAL | Status: DC

## 2013-10-26 MED ORDER — ALPRAZOLAM 0.5 MG PO TABS: 0.5000 mg | ORAL_TABLET | Freq: Every evening | ORAL | Status: DC | PRN

## 2013-10-26 NOTE — Progress Notes (Signed)
Subjective:    Patient ID: Cristina [name], female    DOB: Mar 17, 1961, 52 y.o.   MRN: [id number]  HPI This 52 y.o. female presents for evaluation of fever, sore throat, cough.  Onset today.  +fever Tmax 101; +chills.  No sweats.   +ST moderate; not as severe as strep.  +cough started today.  +soreness in B chest and into B arms.  S/p flu vaccine this season. Grandson tested positive for flu this week.  Feels horrible.  +body aches.  +rhinorrhea; +nasal congestion mild.  +PND.  +coughing; +SOB chronic with worsening.  Using inhaling; wheezing a good bit.  Using Albuterol every four hours.  No v but diarrhea.  +nausea.  No rash.  Worked today.  Felt horrible all day.  Also suffering with R foot pain; pain with weight bearing.    Review of Systems  Constitutional: Positive for fever, chills, diaphoresis and fatigue.  HENT: Positive for congestion, postnasal drip, rhinorrhea and sore throat. Negative for ear pain.   Respiratory: Positive for cough and shortness of breath. Negative for wheezing.   Gastrointestinal: Negative for nausea, vomiting and diarrhea.  Musculoskeletal: Positive for arthralgias, gait problem, joint swelling and myalgias.   Past Medical History  Diagnosis Date  . Hemorrhoid   . Fissure, anal   . Fibroids     uterine  . Cystocele   . Rectocele   . Diabetes mellitus   . Genital herpes   . Arthritis   . Depression   . Hyperlipidemia   . Obesity   . GERD (gastroesophageal reflux disease)   . CTS (carpal tunnel syndrome)   . EP (ectopic pregnancy) 1989  . Hx of migraine headaches   . Dysfunction of eustachian tube   . Constipation   . Tobacco use disorder   . Allergic rhinitis, cause unspecified   . Iron deficiency anemia, unspecified   . Insomnia   . OSA on CPAP    Allergies  Allergen Reactions  . Fish Oil Rash   History   Social History  . Marital Status: Single    Spouse Name: N/A    Number of Children: 3  . Years of Education: college   Occupational  History  . NURSE SECRETARY     [employer]   Social History Main Topics  . Smoking status: Former Games developer  . Smokeless tobacco: Never Used     Comment: smoked occasionally longest 6 mos.  . Alcohol Use: 0.6 oz/week    1 Glasses of wine per week     Comment: 2 x monthly drinks wine  . Drug Use: No  . Sexual Activity: No   Other Topics Concern  . Not on file   Social History Narrative   Drinks coffee and soda's at least one a day.    Marital status:  Divorced in 2000 after six years of marriage; not dating.      Children:  3 daughters (27, 51, 61); 5 grandchildren.      Lives: alone.  2 dogs      Employment:  Working at [employer] as [employer] with [employer] since 2012.      Tobacco: none      Alcohol: socially; special occasions.      Drugs: none      Exercise:  Sporadic; stationary bike.      Sexual activity:  Not sexually active since 2011.       Guns:  No guns in the home.  Smoke detectors in use,       Sometimes uses seat belts.        Objective:   Physical Exam  Constitutional: She is oriented to person, place, and time. She appears well-developed and well-nourished. No distress.  HENT:  Head: Normocephalic and atraumatic.  Right Ear: External ear normal.  Left Ear: External ear normal.  Nose: Nose normal.  Mouth/Throat: Oropharynx is clear and moist.  Eyes: Conjunctivae and EOM are normal. Pupils are equal, round, and reactive to light.  Neck: Normal range of motion. Neck supple. Carotid bruit is not present. No thyromegaly present.  Cardiovascular: Normal rate, regular rhythm, normal heart sounds and intact distal pulses.  Exam reveals no gallop and no friction rub.   No murmur heard. Pulmonary/Chest: Effort normal and breath sounds normal. She has no wheezes. She has no rales.  Abdominal: Soft. Bowel sounds are normal. She exhibits no distension and no mass. There is no tenderness. There is no rebound and no guarding.  Musculoskeletal:       Right ankle: Normal.  She exhibits normal range of motion, no swelling and no ecchymosis. Achilles tendon normal.       Right foot: She exhibits decreased range of motion, tenderness and bony tenderness. She exhibits no swelling.  Lymphadenopathy:    She has no cervical adenopathy.  Neurological: She is alert and oriented to person, place, and time. No cranial nerve deficit.  Skin: Skin is warm and dry. No rash noted. She is not diaphoretic. No erythema. No pallor.  Psychiatric: She has a normal mood and affect. Her behavior is normal.      Results for orders placed in visit on 10/26/13  POCT INFLUENZA A/B      Result Value Range   Influenza A, POC Negative     Influenza B, POC Negative    POCT CBC      Result Value Range   WBC 8.8  4.6 - 10.2 K/uL   Lymph, poc 2.6  0.6 - 3.4   POC LYMPH PERCENT 29.2  10 - 50 %L   MID (cbc) 0.5  0 - 0.9   POC MID % 5.4  0 - 12 %M   POC Granulocyte 5.8  2 - 6.9   Granulocyte percent 65.4  37 - 80 %G   RBC 4.50  4.04 - 5.48 M/uL   Hemoglobin 11.8 (*) 12.2 - 16.2 g/dL   HCT, POC 40.9  81.1 - 47.9 %   MCV 87.1  80 - 97 fL   MCH, POC 26.2 (*) 27 - 31.2 pg   MCHC 30.1 (*) 31.8 - 35.4 g/dL   RDW, POC 91.4     Platelet Count, POC 289  142 - 424 K/uL   MPV 9.4  0 - 99.8 fL   [facility] reading (PRIMARY) by  Dr. Katrinka Blazing.  CXR: NAD.  R FOOT: NAD      Assessment & Plan:  Fever - Plan: POCT Influenza A/B, POCT CBC, DG Chest 2 View  Myalgia - Plan: POCT Influenza A/B, POCT CBC, DG Chest 2 View  Cough - Plan: POCT CBC, DG Chest 2 View  Right foot pain - Plan: DG Foot Complete Right  Contusion, foot, right, initial encounter  Low back pain - Plan: meloxicam (MOBIC) 15 MG tablet, DISCONTINUED: meloxicam (MOBIC) 15 MG tablet  Influenza with other respiratory manifestations  Asthma with acute exacerbation  1. Influenza:  New.  Rx for Tamiflu provided; no work until afebrile for 48 hours.  Rx for Tussionex provided as well. 2. Asthma with mild exacerbation:  New. Secondary to  flu.  Recommend Albuterol scheduled qid for one week and then PRN.  RTC for acute worsening of wheezing or SOB. 3.  Pain/contusion R foot: New.  rx for Ibuprofen 800mg  tid provided.  Also provided rx for Meloxicam but should not use with Ibuprofen.   Recommend rest, ice, elevation.  Meds ordered this encounter  Medications  . oseltamivir (TAMIFLU) 75 MG capsule    Sig: Take 1 capsule (75 mg total) by mouth 2 (two) times daily.    Dispense:  10 capsule    Refill:  0  . ibuprofen (ADVIL,MOTRIN) 800 MG tablet    Sig: Take 1 tablet (800 mg total) by mouth every 8 (eight) hours as needed.    Dispense:  40 tablet    Refill:  0  . chlorpheniramine-HYDROcodone (TUSSIONEX) 10-8 MG/5ML LQCR    Sig: Take 5 mLs by mouth every 12 (twelve) hours as needed for cough.    Dispense:  180 mL    Refill:  0  . DISCONTD: meloxicam (MOBIC) 15 MG tablet    Sig: Take 1 tablet (15 mg total) by mouth daily. With food.  Start day AFTER finishing prednisone.    Dispense:  30 tablet    Refill:  1  . meloxicam (MOBIC) 15 MG tablet    Sig: Take 1 tablet (15 mg total) by mouth daily. With food.    Dispense:  30 tablet    Refill:  3  . ALPRAZolam (XANAX) 0.5 MG tablet    Sig: Take 1 tablet (0.5 mg total) by mouth at bedtime as needed.    Dispense:  30 tablet    Refill:  0     Tylenol 1,000 mg given p.o in office at 8:40 p.m  Nilda Simmer, M.D.  [facility] [address] [place], [place]  [address] [phone] phone [phone] fax

## 2013-11-22 ENCOUNTER — Other Ambulatory Visit: Payer: Self-pay

## 2013-11-22 DIAGNOSIS — Z1231 Encounter for screening mammogram for malignant neoplasm of breast: Secondary | ICD-10-CM

## 2013-11-22 NOTE — Progress Notes (Signed)
Patient ID: Cristina [name], female   DOB: 1961/03/27, 53 y.o.   MRN: [id number] ATTENDING PHYSICIAN NOTE: I have reviewed the chart and agree with the plan as detailed above. Dorcas Mcmurray MD Pager [phone]

## 2013-11-24 ENCOUNTER — Other Ambulatory Visit: Payer: Self-pay | Admitting: Family Medicine

## 2013-11-25 ENCOUNTER — Encounter: Payer: Self-pay | Admitting: Family Medicine

## 2013-11-25 NOTE — Telephone Encounter (Signed)
Dr Tamala Julian, do you want to RF this given at last OV for acute issue?

## 2013-11-26 MED ORDER — SIMVASTATIN 40 MG PO TABS: 40.0000 mg | ORAL_TABLET | Freq: Every day | ORAL | Status: DC

## 2013-11-26 MED ORDER — ROPINIROLE HCL 0.5 MG PO TABS: 0.5000 mg | ORAL_TABLET | Freq: Every day | ORAL | Status: DC

## 2013-11-29 ENCOUNTER — Ambulatory Visit: Payer: 59 | Admitting: Podiatry

## 2013-12-06 ENCOUNTER — Ambulatory Visit
Admission: RE | Admit: 2013-12-06 | Discharge: 2013-12-06 | Disposition: A | Discharge status: Home / Self Care | Payer: 59 | Source: Ambulatory Visit

## 2013-12-06 DIAGNOSIS — Z1231 Encounter for screening mammogram for malignant neoplasm of breast: Secondary | ICD-10-CM

## 2013-12-09 ENCOUNTER — Encounter: Payer: Self-pay | Admitting: Family Medicine

## 2013-12-09 ENCOUNTER — Ambulatory Visit (INDEPENDENT_AMBULATORY_CARE_PROVIDER_SITE_OTHER): Payer: 59 | Admitting: Family Medicine

## 2013-12-09 VITALS — BP 134/84 | HR 86 | Ht 61.0 in | Wt 240.0 lb

## 2013-12-09 DIAGNOSIS — M1990 Unspecified osteoarthritis, unspecified site: Secondary | ICD-10-CM

## 2013-12-09 DIAGNOSIS — M2550 Pain in unspecified joint: Secondary | ICD-10-CM

## 2013-12-09 NOTE — Patient Instructions (Signed)
You have arthritis in multiple joints.  Below are the typical instructions for this: Take tylenol 500mg  1-2 tabs three times a day for pain. Aleve 1-2 tabs twice a day with food Glucosamine sulfate 750mg  twice a day is a supplement that may help. Capsaicin topically up to four times a day may also help with pain. Cortisone injections are an option for certain areas including your trigger finger. If cortisone injections do not help, there are different types of shots that may help but they take longer to take effect. It's important that you continue to stay active. If you are overweight, try to lose weight through diet and exercise. Consider physical therapy to strengthen muscles around the joint that hurts to take pressure off of the joint itself. Shoe inserts with good arch support may be helpful. Walker or cane if needed. Heat or ice 15 minutes at a time 3-4 times a day as needed to help with pain. Water aerobics and cycling with low resistance are the best two types of exercise for arthritis. Follow up with me as needed.

## 2013-12-14 ENCOUNTER — Ambulatory Visit (INDEPENDENT_AMBULATORY_CARE_PROVIDER_SITE_OTHER): Payer: 59 | Admitting: Family Medicine

## 2013-12-14 ENCOUNTER — Encounter: Payer: Self-pay | Admitting: Family Medicine

## 2013-12-14 ENCOUNTER — Other Ambulatory Visit: Payer: Self-pay | Admitting: Emergency Medicine

## 2013-12-14 ENCOUNTER — Ambulatory Visit (INDEPENDENT_AMBULATORY_CARE_PROVIDER_SITE_OTHER): Payer: Commercial Managed Care - PPO | Admitting: Family Medicine

## 2013-12-14 VITALS — BP 124/86 | HR 76 | Temp 98.5°F | Resp 16 | Ht 60.75 in | Wt 241.8 lb

## 2013-12-14 VITALS — BP 124/86 | Wt 241.0 lb

## 2013-12-14 DIAGNOSIS — E1165 Type 2 diabetes mellitus with hyperglycemia: Secondary | ICD-10-CM

## 2013-12-14 DIAGNOSIS — Z9989 Dependence on other enabling machines and devices: Secondary | ICD-10-CM

## 2013-12-14 DIAGNOSIS — R079 Chest pain, unspecified: Secondary | ICD-10-CM

## 2013-12-14 DIAGNOSIS — D509 Iron deficiency anemia, unspecified: Secondary | ICD-10-CM

## 2013-12-14 DIAGNOSIS — F3289 Other specified depressive episodes: Secondary | ICD-10-CM

## 2013-12-14 DIAGNOSIS — E785 Hyperlipidemia, unspecified: Secondary | ICD-10-CM

## 2013-12-14 DIAGNOSIS — R945 Abnormal results of liver function studies: Secondary | ICD-10-CM

## 2013-12-14 DIAGNOSIS — G4733 Obstructive sleep apnea (adult) (pediatric): Secondary | ICD-10-CM

## 2013-12-14 DIAGNOSIS — IMO0001 Reserved for inherently not codable concepts without codable children: Secondary | ICD-10-CM

## 2013-12-14 DIAGNOSIS — E119 Type 2 diabetes mellitus without complications: Secondary | ICD-10-CM

## 2013-12-14 DIAGNOSIS — E7800 Pure hypercholesterolemia, unspecified: Secondary | ICD-10-CM

## 2013-12-14 DIAGNOSIS — F329 Major depressive disorder, single episode, unspecified: Secondary | ICD-10-CM

## 2013-12-14 DIAGNOSIS — R7989 Other specified abnormal findings of blood chemistry: Secondary | ICD-10-CM

## 2013-12-14 DIAGNOSIS — F32A Depression, unspecified: Secondary | ICD-10-CM

## 2013-12-14 LAB — LIPID PANEL
CHOLESTEROL: 154 mg/dL (ref 0–200)
HDL: 41 mg/dL (ref >39)
LDL Cholesterol: 74 mg/dL (ref 0–99)
Total CHOL/HDL Ratio: 3.8 Ratio
Triglycerides: 197 mg/dL — ABNORMAL HIGH (ref <150)
VLDL: 39 mg/dL (ref 0–40)

## 2013-12-14 LAB — CBC WITH DIFFERENTIAL/PLATELET
Basophils Absolute: 0 10*3/uL (ref 0.0–0.1)
Basophils Relative: 0 % (ref 0–1)
EOS ABS: 0.2 10*3/uL (ref 0.0–0.7)
Eosinophils Relative: 3 % (ref 0–5)
HCT: 37.5 % (ref 36.0–46.0)
Hemoglobin: 12.3 g/dL (ref 12.0–15.0)
LYMPHS ABS: 3.3 10*3/uL (ref 0.7–4.0)
Lymphocytes Relative: 38 % (ref 12–46)
MCH: 26.3 pg (ref 26.0–34.0)
MCHC: 32.8 g/dL (ref 30.0–36.0)
MCV: 80.3 fL (ref 78.0–100.0)
Monocytes Absolute: 0.5 10*3/uL (ref 0.1–1.0)
Monocytes Relative: 5 % (ref 3–12)
Neutro Abs: 4.6 10*3/uL (ref 1.7–7.7)
Neutrophils Relative %: 54 % (ref 43–77)
PLATELETS: 284 10*3/uL (ref 150–400)
RBC: 4.67 MIL/uL (ref 3.87–5.11)
RDW: 15.1 % (ref 11.5–15.5)
WBC: 8.6 10*3/uL (ref 4.0–10.5)

## 2013-12-14 LAB — HEMOGLOBIN A1C
Hgb A1c MFr Bld: 7.8 % — ABNORMAL HIGH (ref <5.7)
MEAN PLASMA GLUCOSE: 177 mg/dL — AB (ref <117)

## 2013-12-14 LAB — COMPLETE METABOLIC PANEL WITH GFR
ALT: 79 U/L — AB (ref 0–35)
AST: 46 U/L — ABNORMAL HIGH (ref 0–37)
Albumin: 4.1 g/dL (ref 3.5–5.2)
Alkaline Phosphatase: 72 U/L (ref 39–117)
BUN: 15 mg/dL (ref 6–23)
CO2: 24 mEq/L (ref 19–32)
CREATININE: 0.56 mg/dL (ref 0.50–1.10)
Calcium: 9.5 mg/dL (ref 8.4–10.5)
Chloride: 107 mEq/L (ref 96–112)
GLUCOSE: 158 mg/dL — AB (ref 70–99)
Potassium: 4.3 mEq/L (ref 3.5–5.3)
Sodium: 139 mEq/L (ref 135–145)
Total Bilirubin: 0.3 mg/dL (ref 0.2–1.2)
Total Protein: 6.8 g/dL (ref 6.0–8.3)

## 2013-12-14 MED ORDER — SITAGLIPTIN PHOSPHATE 50 MG PO TABS: 50.0000 mg | ORAL_TABLET | Freq: Every day | ORAL | Status: DC

## 2013-12-14 NOTE — Progress Notes (Signed)
Subjective:    Patient ID: Cristina [name], female    DOB: [birth year], 53 y.o.   MRN: [id number]  HPI This 53 y.o. female presents for three month follow-up:  1. DMII: three month follow-up; no changes to management made at last visit; last HgbA1c 7.7.  Sugars running 139.  Taking Metformin 500mg  two bid.  Not exercising.  2. OA hands:  S/p sports medicine consult last week; no injection given; diagnosed with OA in hands, shoulders, knees, back.  Takes Meloxicam some; usually takes Ibuprofen 800mg  PRN.  Taking Ibuprofen two times daily.    3.  Jaw pain: taking Flexeril some for jaw pain; does not like to wear mouth guard.  Jaw still hurts intermittently.    4.  Blood pressure elevated:  Remaining borderline.  Realizes that elevation of BP due to lack of exercise and weight gain.  5.  Depression and anxiety: improved some over the past few months; still having bad days but not as many.  Compliance with medications.  No SI.  6.  OSA: compliance with CPAP.  Wears all night long.    7. Hyperlipidemia: compliance with medications.  8. Chest pain: continues to have chest pain once per month on average; usually occurs at rest; no exertional chest pain.  +SOB with chest pain.  No nausea or diaphoresis.  Thinks stress related or GERD related.  S/p CXR normal. S/p cardiolite 02/2013 negative.  Requesting NTG though does not feel that chest pain is due to heart.  Not interested in cardiology consultation.  9. Elevated LFTs: due for repeat.  Review of Systems  Constitutional: Negative for fever, chills, diaphoresis and fatigue.  HENT: Negative for ear pain.   Respiratory: Negative for cough, shortness of breath and stridor.   Cardiovascular: Positive for chest pain. Negative for palpitations and leg swelling.  Gastrointestinal: Negative for nausea, abdominal pain, diarrhea and constipation.  Endocrine: Negative for polydipsia, polyphagia and polyuria.  Musculoskeletal: Positive for arthralgias.    Neurological: Negative for seizures, syncope, facial asymmetry, speech difficulty, weakness, light-headedness and numbness.  Psychiatric/Behavioral: Positive for dysphoric mood. Negative for sleep disturbance. The patient is not nervous/anxious.    Past Medical History  Diagnosis Date  . Hemorrhoid   . Fissure, anal   . Fibroids     uterine  . Cystocele   . Rectocele   . Diabetes mellitus   . Genital herpes   . Arthritis   . Depression   . Hyperlipidemia   . Obesity   . GERD (gastroesophageal reflux disease)   . CTS (carpal tunnel syndrome)   . EP (ectopic pregnancy) 1989  . Hx of migraine headaches   . Dysfunction of eustachian tube   . Constipation   . Tobacco use disorder   . Allergic rhinitis, cause unspecified   . Iron deficiency anemia, unspecified   . Insomnia   . OSA on CPAP    Allergies  Allergen Reactions  . Fish Oil Rash   Current Outpatient Prescriptions on File Prior to Visit  Medication Sig Dispense Refill  . acyclovir (ZOVIRAX) 400 MG tablet TAKE 1 TABLET BY MOUTH 2 (TWO) TIMES DAILY.  60 tablet  9  . acyclovir (ZOVIRAX) 400 MG tablet Take 1 tablet (400 mg total) by mouth 2 (two) times daily.  60 tablet  11  . albuterol (PROAIR HFA) 108 (90 BASE) MCG/ACT inhaler Inhale 2 puffs into the lungs every 6 (six) hours as needed.  18 g  1  . ALPRAZolam (XANAX) 0.5  MG tablet Take 1 tablet (0.5 mg total) by mouth at bedtime as needed.  30 tablet  0  . aspirin EC 81 MG tablet Take 81 mg by mouth daily.      [name] azelastine (ASTELIN) 137 MCG/SPRAY nasal spray Place 2 sprays into the nose 2 (two) times daily. Use in each nostril as directed  30 mL  6  . buPROPion (WELLBUTRIN SR) 100 MG 12 hr tablet Take 1 tablet (100 mg total) by mouth 2 (two) times daily.  180 tablet  3  . Calcium Carbonate-Vitamin D (CALCIUM + D PO) Take 1 tablet by mouth daily.      . cetirizine (ZYRTEC) 10 MG tablet Take 10 mg by mouth daily.      . cyclobenzaprine (FLEXERIL) 10 MG tablet Take 1  tablet (10 mg total) by mouth every 8 (eight) hours as needed for muscle spasms.  90 tablet  0  . ferrous sulfate 325 (65 FE) MG EC tablet Take 325 mg by mouth daily.       . fluticasone (FLONASE) 50 MCG/ACT nasal spray Place 2 sprays into the nose daily.  16 g  11  . Glucosamine HCl 1000 MG TABS Take 1,000 mg by mouth 3 (three) times daily.      [name] glucose blood test strip USE TO CHECK BLOOD SUGAR DAILY. DX CODE: 250.00  100 each  3  . hydrocortisone (ANUSOL-HC) 25 MG suppository Place 1 suppository (25 mg total) rectally 2 (two) times daily as needed for hemorrhoids.  25 suppository  5  . ibuprofen (ADVIL,MOTRIN) 800 MG tablet TAKE 1 TABLET BY MOUTH EVERY 8 HOURS AS NEEDED  40 tablet  0  . Lancets MISC Use to check blood sugar daily. Dx code: 250.00  100 each  3  . meloxicam (MOBIC) 15 MG tablet Take 1 tablet (15 mg total) by mouth daily. With food.  30 tablet  3  . metFORMIN (GLUCOPHAGE) 500 MG tablet Take 2 tablets (1,000 mg total) by mouth 2 (two) times daily with a meal.  120 tablet  11  . Multiple Vitamin (MULTIVITAMIN) capsule Take 1 capsule by mouth daily.      . pantoprazole (PROTONIX) 40 MG tablet Take 1 tablet (40 mg total) by mouth 2 (two) times daily.  180 tablet  3  . rOPINIRole (REQUIP) 0.5 MG tablet Take 1 tablet (0.5 mg total) by mouth at bedtime.  30 tablet  11  . senna (SENOKOT) 8.6 MG tablet Take 1 tablet by mouth daily.      . sertraline (ZOLOFT) 100 MG tablet Take 1 tablet (100 mg total) by mouth daily.  90 tablet  3  . simvastatin (ZOCOR) 40 MG tablet Take 1 tablet (40 mg total) by mouth daily at 6 PM.  90 tablet  3  . zolpidem (AMBIEN) 5 MG tablet Take 1 tablet (5 mg total) by mouth at bedtime as needed.  30 tablet  5   No current facility-administered medications on file prior to visit.   History   Social History  . Marital Status: Single    Spouse Name: N/A    Number of Children: 3  . Years of Education: college   Occupational History  . NURSE SECRETARY     [employer]    Social History Main Topics  . Smoking status: Former Research scientist (life sciences)  . Smokeless tobacco: Never Used     Comment: smoked occasionally longest 6 mos.  . Alcohol Use: 0.6 oz/week    1 Glasses of  wine per week     Comment: 2 x monthly drinks wine  . Drug Use: No  . Sexual Activity: No   Other Topics Concern  . Not on file   Social History Narrative   Drinks coffee and soda's at least one a day.    Marital status:  Divorced in 2000 after six years of marriage; not dating.      Children:  3 daughters (16, 13, 53); 5 grandchildren.      Lives: alone.  2 dogs      Employment:  Working at [employer] as [employer] with [employer] since 2012.      Tobacco: none      Alcohol: socially; special occasions.      Drugs: none      Exercise:  Sporadic; stationary bike.      Sexual activity:  Not sexually active since 2011.       Guns:  No guns in the home.       Smoke detectors in use,       Sometimes uses seat belts.        Objective:   Physical Exam  Nursing note and vitals reviewed. Constitutional: She is oriented to person, place, and time. She appears well-developed and well-nourished. No distress.  Obese  HENT:  Head: Normocephalic and atraumatic.  Right Ear: External ear normal.  Left Ear: External ear normal.  Nose: Nose normal.  Mouth/Throat: Oropharynx is clear and moist.  Eyes: Conjunctivae are normal. Pupils are equal, round, and reactive to light.  Neck: Normal range of motion. Neck supple. No JVD present. Carotid bruit is not present. No thyromegaly present.  Cardiovascular: Normal rate, regular rhythm and normal heart sounds.  Exam reveals no gallop and no friction rub.   No murmur heard. Pulmonary/Chest: Effort normal and breath sounds normal. She has no wheezes. She has no rales.  Abdominal: Soft. Bowel sounds are normal. She exhibits no distension. There is no tenderness. There is no rebound.  Lymphadenopathy:    She has no cervical adenopathy.  Neurological: She is alert  and oriented to person, place, and time. No cranial nerve deficit. She exhibits normal muscle tone. Coordination normal.  Skin: Skin is warm and dry. No rash noted. She is not diaphoretic.  Psychiatric: She has a normal mood and affect. Her behavior is normal. Judgment and thought content normal.      Assessment & Plan:  DM - Plan: CBC with Differential, COMPLETE METABOLIC PANEL WITH GFR, Hemoglobin A1c  Pure hypercholesterolemia - Plan: CBC with Differential, COMPLETE METABOLIC PANEL WITH GFR, Lipid panel  Chest pain  Iron deficiency anemia, unspecified  OSA on CPAP  Other and unspecified hyperlipidemia  Depression  Elevated liver function tests - Plan: US Abdomen Complete   1.  DMII: uncontrolled; add Januvia 50mg  daily; continue Metformin; obtain labs; recommend exercise, weight loss.   2.  Hyperlipidemia: controlled; obtain labs; continue current medications. 3.  Elevated blood pressure without diagnosis: persistent; if worsens, will warrant ACE-I. Recommend weight loss, exercise, low-sodium diet. 4.  Depression: improved; no change in therapy.  5.  Elevated LFTs: persistent; if persists, obtain abdominal u/s and hepatitis panel.  Likely due to fatty liver. 6. Obesity: worsening; discussed bariatric surgery with patient; considering Lap Band; encouraged researching Sleeve.  Etiology to multiple comorbidities including DMII, hyperlipidemia, OSA. 7. Chest pain: persistent but rare; s/p CXR and cardiolite.  No exertional component; occurring once monthly; I declined rx for NTG; recommended cardiology consultation and pt declined.  Meds ordered this encounter  Medications  . sitaGLIPtin (JANUVIA) 50 MG tablet    Sig: Take 1 tablet (50 mg total) by mouth daily.    Dispense:  30 tablet    Refill:  5   Reginia Forts, M.D.  [facility] [address] [place], [place]  [address] [phone] phone [phone] fax

## 2013-12-15 ENCOUNTER — Encounter: Payer: Self-pay | Admitting: Family Medicine

## 2013-12-15 LAB — HEPATITIS PANEL, ACUTE
HCV AB: NEGATIVE
HEP B C IGM: NONREACTIVE
HEP B S AG: NEGATIVE
Hep A IgM: NONREACTIVE
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
## 2013-12-20 ENCOUNTER — Encounter: Payer: Self-pay | Admitting: Family Medicine

## 2013-12-20 DIAGNOSIS — M170 Bilateral primary osteoarthritis of knee: Secondary | ICD-10-CM | POA: Insufficient documentation

## 2013-12-20 DIAGNOSIS — M1990 Unspecified osteoarthritis, unspecified site: Secondary | ICD-10-CM | POA: Insufficient documentation

## 2013-12-20 NOTE — Progress Notes (Addendum)
Patient ID: Cristina [name], female   DOB: 1961/10/07, 53 y.o.   MRN: [id number]  PCP: Reginia Forts, MD  Subjective:   HPI: Patient is a 53 y.o. female here for joint arthralgias.  Patient returns today with multiple joint aches. No associated swelling, redness, warmth associated with these. No FH rheumatoid arthritis. No personal history of gout. Off and on aching noted in fingers, bilateral shoulders, dorsal feet mainly. Tried ibuprofen. Getting some triggering of right index finger but does not want injection. Interested in general arthritis instructions.  Past Medical History  Diagnosis Date  . Hemorrhoid   . Fissure, anal   . Fibroids     uterine  . Cystocele   . Rectocele   . Diabetes mellitus   . Genital herpes   . Arthritis   . Depression   . Hyperlipidemia   . Obesity   . GERD (gastroesophageal reflux disease)   . CTS (carpal tunnel syndrome)   . EP (ectopic pregnancy) 1989  . Hx of migraine headaches   . Dysfunction of eustachian tube   . Constipation   . Tobacco use disorder   . Allergic rhinitis, cause unspecified   . Iron deficiency anemia, unspecified   . Insomnia   . OSA on CPAP     Current Outpatient Prescriptions on File Prior to Visit  Medication Sig Dispense Refill  . acyclovir (ZOVIRAX) 400 MG tablet TAKE 1 TABLET BY MOUTH 2 (TWO) TIMES DAILY.  60 tablet  9  . acyclovir (ZOVIRAX) 400 MG tablet Take 1 tablet (400 mg total) by mouth 2 (two) times daily.  60 tablet  11  . albuterol (PROAIR HFA) 108 (90 BASE) MCG/ACT inhaler Inhale 2 puffs into the lungs every 6 (six) hours as needed.  18 g  1  . ALPRAZolam (XANAX) 0.5 MG tablet Take 1 tablet (0.5 mg total) by mouth at bedtime as needed.  30 tablet  0  . aspirin EC 81 MG tablet Take 81 mg by mouth daily.      [name] azelastine (ASTELIN) 137 MCG/SPRAY nasal spray Place 2 sprays into the nose 2 (two) times daily. Use in each nostril as directed  30 mL  6  . buPROPion (WELLBUTRIN SR) 100 MG 12 hr tablet Take 1  tablet (100 mg total) by mouth 2 (two) times daily.  180 tablet  3  . Calcium Carbonate-Vitamin D (CALCIUM + D PO) Take 1 tablet by mouth daily.      . cetirizine (ZYRTEC) 10 MG tablet Take 10 mg by mouth daily.      . cyclobenzaprine (FLEXERIL) 10 MG tablet Take 1 tablet (10 mg total) by mouth every 8 (eight) hours as needed for muscle spasms.  90 tablet  0  . ferrous sulfate 325 (65 FE) MG EC tablet Take 325 mg by mouth daily.       . fluticasone (FLONASE) 50 MCG/ACT nasal spray Place 2 sprays into the nose daily.  16 g  11  . Glucosamine HCl 1000 MG TABS Take 1,000 mg by mouth 3 (three) times daily.      [name] glucose blood test strip USE TO CHECK BLOOD SUGAR DAILY. DX CODE: 250.00  100 each  3  . hydrocortisone (ANUSOL-HC) 25 MG suppository Place 1 suppository (25 mg total) rectally 2 (two) times daily as needed for hemorrhoids.  25 suppository  5  . ibuprofen (ADVIL,MOTRIN) 800 MG tablet TAKE 1 TABLET BY MOUTH EVERY 8 HOURS AS NEEDED  40 tablet  0  .  Lancets MISC Use to check blood sugar daily. Dx code: 250.00  100 each  3  . meloxicam (MOBIC) 15 MG tablet Take 1 tablet (15 mg total) by mouth daily. With food.  30 tablet  3  . metFORMIN (GLUCOPHAGE) 500 MG tablet Take 2 tablets (1,000 mg total) by mouth 2 (two) times daily with a meal.  120 tablet  11  . Multiple Vitamin (MULTIVITAMIN) capsule Take 1 capsule by mouth daily.      . pantoprazole (PROTONIX) 40 MG tablet Take 1 tablet (40 mg total) by mouth 2 (two) times daily.  180 tablet  3  . rOPINIRole (REQUIP) 0.5 MG tablet Take 1 tablet (0.5 mg total) by mouth at bedtime.  30 tablet  11  . senna (SENOKOT) 8.6 MG tablet Take 1 tablet by mouth daily.      . sertraline (ZOLOFT) 100 MG tablet Take 1 tablet (100 mg total) by mouth daily.  90 tablet  3  . simvastatin (ZOCOR) 40 MG tablet Take 1 tablet (40 mg total) by mouth daily at 6 PM.  90 tablet  3  . zolpidem (AMBIEN) 5 MG tablet Take 1 tablet (5 mg total) by mouth at bedtime as needed.  30  tablet  5   No current facility-administered medications on file prior to visit.    Past Surgical History  Procedure Laterality Date  . Cholecystectomy    . Carpal tunnel release  1989    Left  . Ectopic pregnancy surgery  1993  . Tubal ligation  1988  . Cholecystectomy    . Sleep study  09/11/2012    severe OSA.  CPAP titration at 12 cm water pressure.  . Abdominal hysterectomy  11/11/2010    [facility] Plan.  Ovaries intact.  Uterine fibroids with DUB.  . Colonoscopy  06/29/2012    normal.  Repeat in 5 years.  . Esophagogastroduodenoscopy  12/13/2011    dysphagia.  Henrene Pastor.  Normal.    Allergies  Allergen Reactions  . Fish Oil Rash    History   Social History  . Marital Status: Single    Spouse Name: N/A    Number of Children: 3  . Years of Education: college   Occupational History  . NURSE SECRETARY     [employer]   Social History Main Topics  . Smoking status: Former Research scientist (life sciences)  . Smokeless tobacco: Never Used     Comment: smoked occasionally longest 6 mos.  . Alcohol Use: 0.6 oz/week    1 Glasses of wine per week     Comment: 2 x monthly drinks wine  . Drug Use: No  . Sexual Activity: No   Other Topics Concern  . Not on file   Social History Narrative   Drinks coffee and soda's at least one a day.    Marital status:  Divorced in 2000 after six years of marriage; not dating.      Children:  3 daughters (33, 11, 85); 5 grandchildren.      Lives: alone.  2 dogs      Employment:  Working at [employer] as [employer] with [employer] since 2012.      Tobacco: none      Alcohol: socially; special occasions.      Drugs: none      Exercise:  Sporadic; stationary bike.      Sexual activity:  Not sexually active since 2011.       Guns:  No guns in the home.  Smoke detectors in use,       Sometimes uses seat belts.     Family History  Problem Relation Age of Onset  . Hypertension Mother 72  . Osteoarthritis Mother   . Colon cancer Mother   . Diabetes Mother   . Cancer  Mother 65    Colon cancer  . Colon cancer Paternal Grandmother   . Cancer Paternal Grandmother   . Colon cancer      uncle/aunt  . Stroke Maternal Grandmother   . Colon polyps Brother   . Cirrhosis Brother     alcoholism  . Cancer Maternal Grandfather     BP 134/84  Pulse 86  Ht 5\' 1"  (1.549 m)  Wt 240 lb (108.863 kg)  BMI 45.37 kg/m2  Review of Systems: See HPI above.    Objective:  Physical Exam:  Gen: NAD  Bilateral shoulders: No swelling, ecchymoses.  No gross deformity. No TTP. FROM. Negative Hawkins, Neers. Strength 5/5 with empty can and resisted internal/external rotation. Negative apprehension. NV intact distally.  Bilateral hands/wrists: No swelling, ecchymoses.  No gross deformity. No TTP. FROM.  Bilateral feet/ankles: No gross deformity, swelling, ecchymoses FROM Mild midfoot TTP dorsally. Negative ant drawer and talar tilt.   Negative syndesmotic compression. Thompsons test negative. NV intact distally.    Assessment & Plan:  1. Arthritis - symptoms consistent with osteoarthritis.  Discussed tylenol, nsaids, glucosamine, capsaicin.  Consider injections for certain joints, trigger finger.  Weight loss, consider physical therapy.  Arch supports.  Heat or ice.  F/u prn.  Addendum:  Patient called requesting referral to rheumatology.  I think this is reasonable at least for an initial evaluation though we discussed her pain is most likely due to osteoarthritis and not an inflammatory arthritis.  Referral placed.

## 2013-12-20 NOTE — Assessment & Plan Note (Signed)
symptoms consistent with osteoarthritis.  Discussed tylenol, nsaids, glucosamine, capsaicin.  Consider injections for certain joints, trigger finger.  Weight loss, consider physical therapy.  Arch supports.  Heat or ice.  F/u prn.

## 2013-12-22 ENCOUNTER — Encounter: Payer: Self-pay | Admitting: Family Medicine

## 2014-01-03 ENCOUNTER — Encounter: Payer: Self-pay | Admitting: Family Medicine

## 2014-01-03 ENCOUNTER — Other Ambulatory Visit: Payer: Self-pay | Admitting: Physician Assistant

## 2014-01-03 ENCOUNTER — Other Ambulatory Visit: Payer: Self-pay | Admitting: Family Medicine

## 2014-01-03 NOTE — Telephone Encounter (Signed)
Dr Tamala Julian, you just saw pt but don't see discussion of GERD. Can we RF?

## 2014-01-04 NOTE — Telephone Encounter (Signed)
Please call in rx for Ambien as approved.

## 2014-01-05 NOTE — Addendum Note (Signed)
Addended by: Dene Gentry on: 01/05/2014 02:39 PM   Modules accepted: Orders

## 2014-01-05 NOTE — Telephone Encounter (Signed)
Called in ambien 

## 2014-01-07 ENCOUNTER — Encounter: Payer: Self-pay | Admitting: Family Medicine

## 2014-01-11 ENCOUNTER — Telehealth: Payer: Self-pay | Admitting: Family Medicine

## 2014-01-11 NOTE — Telephone Encounter (Signed)
Referral placed previously.

## 2014-01-12 ENCOUNTER — Encounter: Payer: Self-pay | Admitting: Family Medicine

## 2014-01-13 ENCOUNTER — Ambulatory Visit
Admission: RE | Admit: 2014-01-13 | Discharge: 2014-01-13 | Disposition: A | Discharge status: Home / Self Care | Payer: 59 | Source: Ambulatory Visit | Attending: Family Medicine | Admitting: Family Medicine

## 2014-01-13 DIAGNOSIS — R945 Abnormal results of liver function studies: Principal | ICD-10-CM

## 2014-01-13 DIAGNOSIS — R7989 Other specified abnormal findings of blood chemistry: Secondary | ICD-10-CM

## 2014-01-19 ENCOUNTER — Other Ambulatory Visit: Payer: Self-pay | Admitting: Family Medicine

## 2014-01-19 ENCOUNTER — Encounter: Payer: Self-pay | Admitting: Family Medicine

## 2014-01-24 ENCOUNTER — Encounter: Payer: Self-pay | Admitting: Family Medicine

## 2014-02-01 ENCOUNTER — Encounter: Payer: Self-pay | Admitting: Family Medicine

## 2014-02-01 ENCOUNTER — Other Ambulatory Visit: Payer: Self-pay | Admitting: Physician Assistant

## 2014-02-01 ENCOUNTER — Other Ambulatory Visit: Payer: Self-pay | Admitting: Family Medicine

## 2014-02-01 DIAGNOSIS — M545 Low back pain, unspecified: Secondary | ICD-10-CM

## 2014-02-02 ENCOUNTER — Telehealth: Payer: Self-pay | Admitting: Family Medicine

## 2014-02-02 NOTE — Telephone Encounter (Signed)
Dr Tamala Julian, you just saw pt for check-up 12/14/13 but I don't see that allergies were discussed anytime recently. Do you want to give RFs?

## 2014-02-02 NOTE — Telephone Encounter (Signed)
refil request PANTOPRAZOLE SOD DR 40 MG  QUANTITY 180  TAKE 1 TABLET BY MOUTH 2 (TWO) TIMES DAILY l

## 2014-02-03 ENCOUNTER — Encounter: Payer: Self-pay | Admitting: Family Medicine

## 2014-02-03 ENCOUNTER — Telehealth: Payer: Self-pay

## 2014-02-03 NOTE — Telephone Encounter (Signed)
Patient called stated she called [facility] to request a refill on Astelin 2 sprays into nose daily and Flonase. [facility] and request and she need a renewal. Please call patient when refill is approved or faxed over to pharmacy.

## 2014-02-03 NOTE — Telephone Encounter (Signed)
This was sent on 02/02/14 already. Advised pt on VM.

## 2014-02-04 MED ORDER — ACYCLOVIR 400 MG PO TABS: 400.0000 mg | ORAL_TABLET | Freq: Two times a day (BID) | ORAL | Status: DC

## 2014-02-04 MED ORDER — METFORMIN HCL 500 MG PO TABS: 1000.0000 mg | ORAL_TABLET | Freq: Two times a day (BID) | ORAL | Status: DC

## 2014-02-04 MED ORDER — CETIRIZINE HCL 10 MG PO TABS: 10.0000 mg | ORAL_TABLET | Freq: Every day | ORAL | Status: DC

## 2014-02-04 MED ORDER — FLUTICASONE PROPIONATE 50 MCG/ACT NA SUSP: 2.0000 | Freq: Every day | NASAL | Status: DC

## 2014-02-04 MED ORDER — AZELASTINE HCL 0.1 % NA SOLN: 2.0000 | Freq: Two times a day (BID) | NASAL | Status: DC

## 2014-02-04 MED ORDER — GLUCOSE BLOOD VI STRP: ORAL_STRIP | Status: DC

## 2014-02-04 MED ORDER — PANTOPRAZOLE SODIUM 40 MG PO TBEC: 40.0000 mg | DELAYED_RELEASE_TABLET | Freq: Two times a day (BID) | ORAL | Status: DC

## 2014-02-04 MED ORDER — ALBUTEROL SULFATE HFA 108 (90 BASE) MCG/ACT IN AERS: 2.0000 | INHALATION_SPRAY | Freq: Four times a day (QID) | RESPIRATORY_TRACT | Status: DC | PRN

## 2014-02-04 MED ORDER — MELOXICAM 15 MG PO TABS: 15.0000 mg | ORAL_TABLET | Freq: Every day | ORAL | Status: DC

## 2014-02-04 MED ORDER — ROPINIROLE HCL 0.5 MG PO TABS: 0.5000 mg | ORAL_TABLET | Freq: Every day | ORAL | Status: DC

## 2014-02-04 MED ORDER — BUPROPION HCL ER (SR) 100 MG PO TB12: 100.0000 mg | ORAL_TABLET | Freq: Two times a day (BID) | ORAL | Status: DC

## 2014-02-04 MED ORDER — SIMVASTATIN 40 MG PO TABS: 40.0000 mg | ORAL_TABLET | Freq: Every day | ORAL | Status: DC

## 2014-02-04 MED ORDER — SERTRALINE HCL 100 MG PO TABS: 100.0000 mg | ORAL_TABLET | Freq: Every day | ORAL | Status: DC

## 2014-02-04 NOTE — Telephone Encounter (Signed)
Rx for Astelin and Flonase sent to [facility].

## 2014-02-04 NOTE — Telephone Encounter (Signed)
Pt advised.

## 2014-02-04 NOTE — Telephone Encounter (Signed)
See Rx request is pending to Dr. Tamala Julian. Please advise if ok for refills to be sent I will call in today.

## 2014-02-08 ENCOUNTER — Encounter: Payer: Self-pay | Admitting: Family Medicine

## 2014-02-11 MED ORDER — ZOLPIDEM TARTRATE 5 MG PO TABS: 5.0000 mg | ORAL_TABLET | Freq: Every evening | ORAL | Status: DC | PRN

## 2014-02-11 NOTE — Telephone Encounter (Signed)
Please fax or call in refill on Ambien.

## 2014-02-11 NOTE — Telephone Encounter (Signed)
Faxed Rx

## 2014-02-18 ENCOUNTER — Ambulatory Visit (INDEPENDENT_AMBULATORY_CARE_PROVIDER_SITE_OTHER): Payer: Self-pay | Admitting: General Surgery

## 2014-02-23 ENCOUNTER — Encounter: Payer: Self-pay | Admitting: Family Medicine

## 2014-02-24 MED ORDER — PANTOPRAZOLE SODIUM 40 MG PO TBEC: 40.0000 mg | DELAYED_RELEASE_TABLET | Freq: Two times a day (BID) | ORAL | Status: DC

## 2014-03-11 ENCOUNTER — Telehealth: Payer: Self-pay | Admitting: *Deleted

## 2014-03-11 MED ORDER — AZITHROMYCIN 250 MG PO TABS
ORAL_TABLET | ORAL | Status: DC
Start: 2014-03-11 — End: 2014-03-28

## 2014-03-11 NOTE — Telephone Encounter (Signed)
Patient request antibiotics for URI

## 2014-03-15 ENCOUNTER — Telehealth: Payer: Self-pay

## 2014-03-15 ENCOUNTER — Ambulatory Visit: Payer: 59

## 2014-03-15 ENCOUNTER — Ambulatory Visit (INDEPENDENT_AMBULATORY_CARE_PROVIDER_SITE_OTHER): Payer: Commercial Managed Care - PPO | Admitting: Family Medicine

## 2014-03-15 VITALS — BP 140/92 | HR 100 | Temp 98.4°F | Resp 18 | Ht 60.0 in | Wt 237.6 lb

## 2014-03-15 DIAGNOSIS — R059 Cough, unspecified: Secondary | ICD-10-CM

## 2014-03-15 DIAGNOSIS — R062 Wheezing: Secondary | ICD-10-CM

## 2014-03-15 DIAGNOSIS — R05 Cough: Secondary | ICD-10-CM

## 2014-03-15 DIAGNOSIS — J22 Unspecified acute lower respiratory infection: Secondary | ICD-10-CM

## 2014-03-15 MED ORDER — FLUCONAZOLE 150 MG PO TABS: 150.0000 mg | ORAL_TABLET | Freq: Once | ORAL | Status: DC

## 2014-03-15 MED ORDER — IPRATROPIUM BROMIDE 0.02 % IN SOLN
0.5000 mg | Freq: Once | RESPIRATORY_TRACT | Status: AC
  Administered 2014-03-15: 0.5 mg via RESPIRATORY_TRACT

## 2014-03-15 MED ORDER — AMOXICILLIN-POT CLAVULANATE 875-125 MG PO TABS: 1.0000 | ORAL_TABLET | Freq: Two times a day (BID) | ORAL | Status: DC

## 2014-03-15 MED ORDER — ALBUTEROL SULFATE (2.5 MG/3ML) 0.083% IN NEBU
2.5000 mg | INHALATION_SOLUTION | Freq: Once | RESPIRATORY_TRACT | Status: AC
  Administered 2014-03-15: 2.5 mg via RESPIRATORY_TRACT

## 2014-03-15 MED ORDER — BENZONATATE 100 MG PO CAPS: 200.0000 mg | ORAL_CAPSULE | Freq: Two times a day (BID) | ORAL | Status: DC | PRN

## 2014-03-15 MED ORDER — PREDNISONE 20 MG PO TABS: ORAL_TABLET | ORAL | Status: DC

## 2014-03-15 NOTE — Patient Instructions (Signed)

## 2014-03-15 NOTE — Progress Notes (Signed)
Chief Complaint:  Chief Complaint  Patient presents with  . URI    Nasal/cough-yellowish green;wheezing;sinus press;HA;ear pain; chest congestion;fever x 3dys    HPI: Cristina [name] is a 53 y.o. female who is here for  3 day history of sinus congestion, tenderness, HA, productive cough, and wheezing intermittently.  Using her inhaler prn. Has had temp of 101.2 and has taken a z pack, mucinex, and also tylenol without relief.    Past Medical History  Diagnosis Date  . Hemorrhoid   . Fissure, anal   . Fibroids     uterine  . Cystocele   . Rectocele   . Diabetes mellitus   . Genital herpes   . Arthritis   . Depression   . Hyperlipidemia   . Obesity   . GERD (gastroesophageal reflux disease)   . CTS (carpal tunnel syndrome)   . EP (ectopic pregnancy) 1989  . Hx of migraine headaches   . Dysfunction of eustachian tube   . Constipation   . Tobacco use disorder   . Allergic rhinitis, cause unspecified   . Iron deficiency anemia, unspecified   . Insomnia   . OSA on CPAP    Past Surgical History  Procedure Laterality Date  . Cholecystectomy    . Carpal tunnel release  1989    Left  . Ectopic pregnancy surgery  1993  . Tubal ligation  1988  . Cholecystectomy    . Sleep study  09/11/2012    severe OSA.  CPAP titration at 12 cm water pressure.  . Abdominal hysterectomy  11/11/2010    [facility] Plan.  Ovaries intact.  Uterine fibroids with DUB.  . Colonoscopy  06/29/2012    normal.  Repeat in 5 years.  . Esophagogastroduodenoscopy  12/13/2011    dysphagia.  Henrene Pastor.  Normal.   History   Social History  . Marital Status: Single    Spouse Name: N/A    Number of Children: 3  . Years of Education: college   Occupational History  . NURSE SECRETARY     [employer]   Social History Main Topics  . Smoking status: Former Research scientist (life sciences)  . Smokeless tobacco: Never Used     Comment: smoked occasionally longest 6 mos.  . Alcohol Use: 0.6 oz/week    1 Glasses of wine per week     Comment:  2 x monthly drinks wine  . Drug Use: No  . Sexual Activity: No   Other Topics Concern  . None   Social History Narrative   Drinks coffee and soda's at least one a day.    Marital status:  Divorced in 2000 after six years of marriage; not dating.      Children:  3 daughters (26, 34, 39); 5 grandchildren.      Lives: alone.  2 dogs      Employment:  Working at [employer] as [employer] with [employer] since 2012.      Tobacco: none      Alcohol: socially; special occasions.      Drugs: none      Exercise:  Sporadic; stationary bike.      Sexual activity:  Not sexually active since 2011.       Guns:  No guns in the home.       Smoke detectors in use,       Sometimes uses seat belts.    Family History  Problem Relation Age of Onset  . Hypertension Mother 80  .  Osteoarthritis Mother   . Colon cancer Mother   . Diabetes Mother   . Cancer Mother 44    Colon cancer  . Colon cancer Paternal Grandmother   . Cancer Paternal Grandmother   . Colon cancer      uncle/aunt  . Stroke Maternal Grandmother   . Colon polyps Brother   . Cirrhosis Brother     alcoholism  . Cancer Maternal Grandfather    Allergies  Allergen Reactions  . Fish Oil Rash   Prior to Admission medications   Medication Sig Start Date End Date Taking? Authorizing Provider  acyclovir (ZOVIRAX) 400 MG tablet Take 1 tablet (400 mg total) by mouth 2 (two) times daily. 02/04/14  Yes [name], MD  albuterol (PROAIR HFA) 108 (90 BASE) MCG/ACT inhaler Inhale 2 puffs into the lungs every 6 (six) hours as needed. 02/04/14  Yes [name], MD  ALPRAZolam Duanne Moron) 0.5 MG tablet Take 1 tablet (0.5 mg total) by mouth at bedtime as needed. 10/26/13  Yes [name], MD  aspirin EC 81 MG tablet Take 81 mg by mouth daily.   Yes Historical Provider, MD  azelastine (ASTELIN) 137 MCG/SPRAY nasal spray Place 2 sprays into both nostrils 2 (two) times daily. Use in each nostril as directed 02/04/14  Yes [name], MD    buPROPion [facility] SR) 100 MG 12 hr tablet Take 1 tablet (100 mg total) by mouth 2 (two) times daily. 02/04/14  Yes [name], MD  Calcium Carbonate-Vitamin D (CALCIUM + D PO) Take 1 tablet by mouth daily.   Yes Historical Provider, MD  cetirizine (ZYRTEC) 10 MG tablet Take 1 tablet (10 mg total) by mouth daily. 02/04/14  Yes [name], MD  cyclobenzaprine (FLEXERIL) 10 MG tablet Take 1 tablet (10 mg total) by mouth every 8 (eight) hours as needed for muscle spasms. 06/16/13 06/16/14 Yes Dene Gentry, MD  ferrous sulfate 325 (65 FE) MG EC tablet Take 325 mg by mouth daily.    Yes Historical Provider, MD  fluticasone (FLONASE) 50 MCG/ACT nasal spray Place 2 sprays into both nostrils daily. 02/04/14  Yes [name], MD  Glucosamine HCl 1000 MG TABS Take 1,000 mg by mouth 3 (three) times daily.   Yes Historical Provider, MD  glucose blood test strip USE TO CHECK BLOOD SUGAR DAILY. DX CODE: 250.00 02/04/14  Yes [name], MD  hydrocortisone (ANUSOL-HC) 25 MG suppository Place 1 suppository (25 mg total) rectally 2 (two) times daily as needed for hemorrhoids. 05/24/13  Yes [name], MD  ibuprofen (ADVIL,MOTRIN) 800 MG tablet TAKE 1 TABLET BY MOUTH EVERY 8 HOURS AS NEEDED 01/19/14  Yes [name], MD  Lancets MISC Use to check blood sugar daily. Dx code: 250.00 06/09/13  Yes [name], MD  meloxicam (MOBIC) 15 MG tablet Take 1 tablet (15 mg total) by mouth daily. With food. 02/04/14  Yes [name], MD  metFORMIN (GLUCOPHAGE) 500 MG tablet Take 2 tablets (1,000 mg total) by mouth 2 (two) times daily with a meal. 02/04/14  Yes [name], MD  Multiple Vitamin (MULTIVITAMIN) capsule Take 1 capsule by mouth daily.   Yes Historical Provider, MD  pantoprazole (PROTONIX) 40 MG tablet Take 1 tablet (40 mg total) by mouth 2 (two) times daily. 02/24/14  Yes [name], MD  rOPINIRole (REQUIP) 0.5 MG tablet Take 1 tablet (0.5 mg total) by mouth at bedtime. 02/04/14  Yes Renette Butters  Tamala Julian, MD  senna (SENOKOT) 8.6 MG tablet Take 1 tablet by mouth daily.   Yes Historical Provider, MD  sertraline (ZOLOFT) 100 MG tablet Take 1 tablet (100 mg total) by mouth daily. 02/04/14  Yes [name], MD  simvastatin (ZOCOR) 40 MG tablet Take 1 tablet (40 mg total) by mouth daily at 6 PM. 02/04/14  Yes [name], MD  sitaGLIPtin (JANUVIA) 50 MG tablet Take 1 tablet (50 mg total) by mouth daily. 12/14/13  Yes [name], MD  zolpidem (AMBIEN) 5 MG tablet Take 1 tablet (5 mg total) by mouth at bedtime as needed for sleep. 02/11/14  Yes [name], MD  acyclovir (ZOVIRAX) 400 MG tablet TAKE 1 TABLET BY MOUTH 2 (TWO) TIMES DAILY. 07/22/13   [name], MD  azithromycin (ZITHROMAX) 250 MG tablet Take as directed 03/11/14   Minus Breeding, MD     ROS: The patient denies  night sweats, unintentional weight loss, chest pain, palpitations, dyspnea on exertion, nausea, vomiting, abdominal pain, dysuria, hematuria, melena, numbness, weakness, or tingling.  All other systems have been reviewed and were otherwise negative with the exception of those mentioned in the HPI and as above.    PHYSICAL EXAM: Filed Vitals:   03/15/14 1710  BP: 140/92  Pulse: 100  Temp: 98.4 F (36.9 C)  Resp: 18  SPo2 98% Filed Vitals:   03/15/14 1710  Height: 5' (1.524 m)  Weight: 237 lb 9.6 oz (107.775 kg)   Body mass index is 46.4 kg/(m^2).  General: Alert, no acute distress HEENT:  Normocephalic, atraumatic, oropharynx patent. EOMI, PERRLA Cardiovascular:  Regular rate and rhythm, no rubs murmurs or gallops.  No Carotid bruits, radial pulse intact. No pedal edema.  Respiratory: Clear to auscultation bilaterally.  No wheezes, rales, or rhonchi.  No cyanosis, no use of accessory musculature GI: No organomegaly, abdomen is soft and non-tender, positive bowel sounds.  No masses. Skin: No rashes. Neurologic: Facial musculature symmetric. Psychiatric: Patient is appropriate throughout our  interaction. Lymphatic: No cervical lymphadenopathy Musculoskeletal: Gait intact.   LABS:    EKG/XRAY:   Primary read interpreted by Dr. Marin Comment at [facility]. Neg for acute cardiopulmonary process   ASSESSMENT/PLAN: Encounter Diagnoses  Name Primary?  . Wheezing   . Cough   . Lower respiratory infection Yes   Currently no wheezing but she states it comes n intermittently so will give steroids Rx Augmentin, prednisone, diflucan, tessalon perles.  Use albuterol that she has at home prn, cont with claritin F/u prn  Gross sideeffects, risk and benefits, and alternatives of medications d/w patient. Patient is aware that all medications have potential sideeffects and we are unable to predict every sideeffect or drug-drug interaction that may occur.  Glenford Bayley, DO 03/15/2014 5:58 PM

## 2014-03-15 NOTE — Telephone Encounter (Signed)
Called pharm and verified the problem. The tessalon perless 100 mg are back ordered. Changed rx from 100mg  2 BID to 200mg  1 bid   Tried to call pt to let her know. VM not set up

## 2014-03-15 NOTE — Telephone Encounter (Signed)
Pt.notified

## 2014-03-15 NOTE — Telephone Encounter (Signed)
DR. Marin Comment - Pt said she was supposed to have some cough medicine called in for her illness.  The pharmacy is out and said if we called in a different dosage or "something" that they may have it.  Please call [phone]

## 2014-03-28 ENCOUNTER — Ambulatory Visit (INDEPENDENT_AMBULATORY_CARE_PROVIDER_SITE_OTHER): Payer: Commercial Managed Care - PPO | Admitting: Family Medicine

## 2014-03-28 ENCOUNTER — Other Ambulatory Visit: Payer: Self-pay | Admitting: Family Medicine

## 2014-03-28 ENCOUNTER — Encounter: Payer: Self-pay | Admitting: Family Medicine

## 2014-03-28 VITALS — BP 120/88 | HR 86 | Temp 98.7°F | Resp 16 | Ht 61.0 in | Wt 242.0 lb

## 2014-03-28 DIAGNOSIS — R945 Abnormal results of liver function studies: Secondary | ICD-10-CM

## 2014-03-28 DIAGNOSIS — J0100 Acute maxillary sinusitis, unspecified: Secondary | ICD-10-CM

## 2014-03-28 DIAGNOSIS — E7800 Pure hypercholesterolemia, unspecified: Secondary | ICD-10-CM

## 2014-03-28 DIAGNOSIS — F32A Depression, unspecified: Secondary | ICD-10-CM

## 2014-03-28 DIAGNOSIS — J309 Allergic rhinitis, unspecified: Secondary | ICD-10-CM | POA: Insufficient documentation

## 2014-03-28 DIAGNOSIS — E669 Obesity, unspecified: Secondary | ICD-10-CM

## 2014-03-28 DIAGNOSIS — R7989 Other specified abnormal findings of blood chemistry: Secondary | ICD-10-CM

## 2014-03-28 DIAGNOSIS — R030 Elevated blood-pressure reading, without diagnosis of hypertension: Secondary | ICD-10-CM

## 2014-03-28 DIAGNOSIS — E119 Type 2 diabetes mellitus without complications: Secondary | ICD-10-CM

## 2014-03-28 DIAGNOSIS — F3289 Other specified depressive episodes: Secondary | ICD-10-CM

## 2014-03-28 DIAGNOSIS — F329 Major depressive disorder, single episode, unspecified: Secondary | ICD-10-CM

## 2014-03-28 DIAGNOSIS — IMO0001 Reserved for inherently not codable concepts without codable children: Secondary | ICD-10-CM

## 2014-03-28 DIAGNOSIS — E1165 Type 2 diabetes mellitus with hyperglycemia: Principal | ICD-10-CM

## 2014-03-28 LAB — CBC WITH DIFFERENTIAL/PLATELET
BASOS ABS: 0 10*3/uL (ref 0.0–0.1)
Basophils Relative: 0 % (ref 0–1)
EOS PCT: 3 % (ref 0–5)
Eosinophils Absolute: 0.4 10*3/uL (ref 0.0–0.7)
HCT: 35.4 % — ABNORMAL LOW (ref 36.0–46.0)
Hemoglobin: 11.7 g/dL — ABNORMAL LOW (ref 12.0–15.0)
LYMPHS PCT: 24 % (ref 12–46)
Lymphs Abs: 3.4 10*3/uL (ref 0.7–4.0)
MCH: 26.7 pg (ref 26.0–34.0)
MCHC: 33.1 g/dL (ref 30.0–36.0)
MCV: 80.6 fL (ref 78.0–100.0)
Monocytes Absolute: 0.9 10*3/uL (ref 0.1–1.0)
Monocytes Relative: 6 % (ref 3–12)
NEUTROS ABS: 9.5 10*3/uL — AB (ref 1.7–7.7)
Neutrophils Relative %: 67 % (ref 43–77)
PLATELETS: 319 10*3/uL (ref 150–400)
RBC: 4.39 MIL/uL (ref 3.87–5.11)
RDW: 15.5 % (ref 11.5–15.5)
WBC: 14.2 10*3/uL — ABNORMAL HIGH (ref 4.0–10.5)

## 2014-03-28 LAB — COMPLETE METABOLIC PANEL WITH GFR
ALT: 63 U/L — AB (ref 0–35)
AST: 30 U/L (ref 0–37)
Albumin: 3.8 g/dL (ref 3.5–5.2)
Alkaline Phosphatase: 76 U/L (ref 39–117)
BUN: 15 mg/dL (ref 6–23)
CALCIUM: 9.4 mg/dL (ref 8.4–10.5)
CO2: 24 mEq/L (ref 19–32)
Chloride: 105 mEq/L (ref 96–112)
Creat: 0.68 mg/dL (ref 0.50–1.10)
GFR, Est African American: >89 mL/min
GFR, Est Non African American: >89 mL/min
Glucose, Bld: 187 mg/dL — ABNORMAL HIGH (ref 70–99)
Potassium: 4.1 mEq/L (ref 3.5–5.3)
Sodium: 141 mEq/L (ref 135–145)
Total Bilirubin: 0.4 mg/dL (ref 0.2–1.2)
Total Protein: 6.8 g/dL (ref 6.0–8.3)

## 2014-03-28 LAB — LIPID PANEL
Cholesterol: 158 mg/dL (ref 0–200)
HDL: 40 mg/dL (ref >39)
LDL Cholesterol: 85 mg/dL (ref 0–99)
Total CHOL/HDL Ratio: 4 Ratio
Triglycerides: 166 mg/dL — ABNORMAL HIGH (ref <150)
VLDL: 33 mg/dL (ref 0–40)

## 2014-03-28 LAB — HEMOGLOBIN A1C
Hgb A1c MFr Bld: 8.4 % — ABNORMAL HIGH (ref <5.7)
MEAN PLASMA GLUCOSE: 194 mg/dL — AB (ref <117)

## 2014-03-28 MED ORDER — MONTELUKAST SODIUM 10 MG PO TABS: 10.0000 mg | ORAL_TABLET | Freq: Every day | ORAL | Status: DC

## 2014-03-28 MED ORDER — DOXYCYCLINE HYCLATE 100 MG PO CAPS: 100.0000 mg | ORAL_CAPSULE | Freq: Two times a day (BID) | ORAL | Status: DC

## 2014-03-28 MED ORDER — FLUCONAZOLE 150 MG PO TABS: 150.0000 mg | ORAL_TABLET | Freq: Once | ORAL | Status: DC

## 2014-03-28 NOTE — Progress Notes (Addendum)
Subjective:    Patient ID: Cristina [name], female    DOB: 10/20/1961, 53 y.o.   MRN: [id number]  HPI Chief Complaint  Patient presents with  . Follow-up    diabetes  . Hyperlipidemia  . Depression    This chart was scribed for [name], MD by Thea Alken, ED Scribe. This patient was seen in room 24 and the patient's care was started at 12:34 PM.  HPI Comments: Cristina [name] is a 53 y.o. female who presents to the [facility] here for a 3 month follow-up regarding DM, high cholesterol and depression. Pt was prescribed januvia 50 mg in Feb. Pt BP was borderline. Pt is obese and was recommended bariatric surgery. Pt was c/o CP but declined referral to cardiologist. Liver function test remained elevated so abdominal ultrasound obtained which showed fatty liver.   Pt states she doing okay. She reports sugars are 160 in the morning at its highest. Pt denies side affects to [place]. Pt denies checking BP regularly but usually run about 120/86. Pt reports that she is over weight. Pt reports tingling in right second digit R foot. Pt has not seen rheumatologist but has been referred by sports medicine.   Pt reports intermittent LUQ pain. Pt denies eating influencing pain. Pt reports diarrhea. Pt takes protonix twice a day.  Pt denies seeing the bariatric surgeon due to the expenses.  She has an appointment with GI in July due to GI symptoms.  LUQ pain develops on empty stomach and with eating certain foods such as salad.   Pt reports a cold onset 2 weeks and was seen by Dr. Marin Comment. She was prescribed Augmentin prednisone, diflucan and tessalon perles . Pt reports cough consisting of yellow sputum, diaphoresis, congestion, minor maxillary sinus pressure and wheezing.  Pt reports that she uses her inhaler. Pt states allergies worsen this time a year. Pt denies sneezing.  S/p Zpack which was prescribed by Dr. Percival Spanish prior to undergoing evaluation by Dr. Marin Comment.  Compliance with Zyrtec,  Astelin, Flonase, Albuterol.  Emotionally stable currently; having good and bad days.  Having more good days than bad days.  Family is stable.  No SI/HI.     Past Medical History  Diagnosis Date  . Hemorrhoid   . Fissure, anal   . Fibroids     uterine  . Cystocele   . Rectocele   . Diabetes mellitus   . Genital herpes   . Arthritis   . Depression   . Hyperlipidemia   . Obesity   . GERD (gastroesophageal reflux disease)   . CTS (carpal tunnel syndrome)   . EP (ectopic pregnancy) 1989  . Hx of migraine headaches   . Dysfunction of eustachian tube   . Constipation   . Tobacco use disorder   . Allergic rhinitis, cause unspecified   . Iron deficiency anemia, unspecified   . Insomnia   . OSA on CPAP    Allergies  Allergen Reactions  . Fish Oil Rash   Prior to Admission medications   Medication Sig Start Date End Date Taking? Authorizing Provider  acyclovir (ZOVIRAX) 400 MG tablet TAKE 1 TABLET BY MOUTH 2 (TWO) TIMES DAILY. 07/22/13   [name], MD  acyclovir (ZOVIRAX) 400 MG tablet Take 1 tablet (400 mg total) by mouth 2 (two) times daily. 02/04/14   [name], MD  albuterol (PROAIR HFA) 108 (90 BASE) MCG/ACT inhaler Inhale 2 puffs into the lungs every 6 (  six) hours as needed. 02/04/14   [name], MD  ALPRAZolam Duanne Moron) 0.5 MG tablet Take 1 tablet (0.5 mg total) by mouth at bedtime as needed. 10/26/13   [name], MD  amoxicillin-clavulanate (AUGMENTIN) 875-125 MG per tablet Take 1 tablet by mouth 2 (two) times daily. 03/15/14   Thao P Le, DO  aspirin EC 81 MG tablet Take 81 mg by mouth daily.    Historical Provider, MD  azelastine (ASTELIN) 137 MCG/SPRAY nasal spray Place 2 sprays into both nostrils 2 (two) times daily. Use in each nostril as directed 02/04/14   [name], MD  azithromycin [facility]) 250 MG tablet Take as directed 03/11/14   Minus Breeding, MD  benzonatate (TESSALON) 100 MG capsule Take 2 capsules (200 mg total) by mouth 2 (two) times daily  as needed for cough. 03/15/14   Thao P Le, DO  buPROPion (WELLBUTRIN SR) 100 MG 12 hr tablet Take 1 tablet (100 mg total) by mouth 2 (two) times daily. 02/04/14   [name], MD  Calcium Carbonate-Vitamin D (CALCIUM + D PO) Take 1 tablet by mouth daily.    Historical Provider, MD  cetirizine (ZYRTEC) 10 MG tablet Take 1 tablet (10 mg total) by mouth daily. 02/04/14   [name], MD  cyclobenzaprine (FLEXERIL) 10 MG tablet Take 1 tablet (10 mg total) by mouth every 8 (eight) hours as needed for muscle spasms. 06/16/13 06/16/14  Dene Gentry, MD  ferrous sulfate 325 (65 FE) MG EC tablet Take 325 mg by mouth daily.     Historical Provider, MD  fluconazole (DIFLUCAN) 150 MG tablet Take 1 tablet (150 mg total) by mouth once. May repeat 03/15/14   Thao P Le, DO  fluticasone (FLONASE) 50 MCG/ACT nasal spray Place 2 sprays into both nostrils daily. 02/04/14   [name], MD  Glucosamine HCl 1000 MG TABS Take 1,000 mg by mouth 3 (three) times daily.    Historical Provider, MD  glucose blood test strip USE TO CHECK BLOOD SUGAR DAILY. DX CODE: 250.00 02/04/14   [name], MD  hydrocortisone (ANUSOL-HC) 25 MG suppository Place 1 suppository (25 mg total) rectally 2 (two) times daily as needed for hemorrhoids. 05/24/13   [name], MD  ibuprofen (ADVIL,MOTRIN) 800 MG tablet TAKE 1 TABLET BY MOUTH EVERY 8 HOURS AS NEEDED 01/19/14   [name], MD  Lancets MISC Use to check blood sugar daily. Dx code: 250.00 06/09/13   [name], MD  meloxicam (MOBIC) 15 MG tablet Take 1 tablet (15 mg total) by mouth daily. With food. 02/04/14   [name], MD  metFORMIN (GLUCOPHAGE) 500 MG tablet Take 2 tablets (1,000 mg total) by mouth 2 (two) times daily with a meal. 02/04/14   [name], MD  Multiple Vitamin (MULTIVITAMIN) capsule Take 1 capsule by mouth daily.    Historical Provider, MD  pantoprazole (PROTONIX) 40 MG tablet Take 1 tablet (40 mg total) by mouth 2 (two) times daily. 02/24/14   [name], MD  predniSONE (DELTASONE) 20 MG tablet Take 3 tabs PO daily x 3 days, 2 tabs PO daily x 3 days, then 1 tab PO daily x 3 days 03/15/14   Thao P Le, DO  rOPINIRole (REQUIP) 0.5 MG tablet Take 1 tablet (0.5 mg total) by mouth at bedtime. 02/04/14   [name], MD  senna (SENOKOT) 8.6 MG tablet Take 1 tablet by mouth daily.  Historical Provider, MD  sertraline (ZOLOFT) 100 MG tablet Take 1 tablet (100 mg total) by mouth daily. 02/04/14   [name], MD  simvastatin (ZOCOR) 40 MG tablet Take 1 tablet (40 mg total) by mouth daily at 6 PM. 02/04/14   [name], MD  sitaGLIPtin (JANUVIA) 50 MG tablet Take 1 tablet (50 mg total) by mouth daily. 12/14/13   [name], MD  zolpidem (AMBIEN) 5 MG tablet Take 1 tablet (5 mg total) by mouth at bedtime as needed for sleep. 02/11/14   [name], MD   Review of Systems  Constitutional: Negative for fever, chills, diaphoresis and fatigue.  HENT: Positive for congestion, rhinorrhea and sinus pressure. Negative for ear pain, sneezing and sore throat.   Respiratory: Positive for cough and wheezing. Negative for shortness of breath and stridor.   Cardiovascular: Positive for chest pain. Negative for palpitations and leg swelling.  Gastrointestinal: Positive for abdominal pain and diarrhea. Negative for nausea, vomiting, blood in stool, abdominal distention and anal bleeding.  Endocrine: Negative for cold intolerance, heat intolerance, polydipsia, polyphagia and polyuria.  Musculoskeletal: Positive for arthralgias.  Skin: Negative for rash.  Allergic/Immunologic: Positive for environmental allergies.  Neurological: Negative for dizziness, tremors, seizures, syncope, facial asymmetry, speech difficulty, weakness, light-headedness, numbness and headaches.  Psychiatric/Behavioral: Positive for sleep disturbance and dysphoric mood. Negative for suicidal ideas and self-injury. The patient is not nervous/anxious.        Objective:   Physical Exam    Nursing note and vitals reviewed. Constitutional: She is oriented to person, place, and time. She appears well-developed and well-nourished. No distress.  HENT:  Head: Normocephalic and atraumatic.  Right Ear: External ear normal.  Left Ear: External ear normal.  Nose: Right sinus exhibits maxillary sinus tenderness. Left sinus exhibits maxillary sinus tenderness.  Mouth/Throat: Oropharynx is clear and moist. No oropharyngeal exudate.  Drainage in throat noted  Eyes: Conjunctivae and EOM are normal. Pupils are equal, round, and reactive to light.  Neck: Normal range of motion. Neck supple. No JVD present. No tracheal deviation present. No thyromegaly present.  Cardiovascular: Normal rate, regular rhythm, normal heart sounds and intact distal pulses.  Exam reveals no gallop.   No murmur heard. Pulmonary/Chest: Effort normal and breath sounds normal. No respiratory distress. She has no wheezes. She has no rales. She exhibits no tenderness.  Abdominal: Soft. Bowel sounds are normal. She exhibits no distension and no mass. There is tenderness in the left upper quadrant. There is no rebound and no guarding.  Musculoskeletal: Normal range of motion. She exhibits no edema.  No swelling in bilateral legs  Lymphadenopathy:    She has no cervical adenopathy.  Neurological: She is alert and oriented to person, place, and time. No cranial nerve deficit. She exhibits normal muscle tone. Coordination normal.  Skin: Skin is warm and dry. No rash noted. She is not diaphoretic.  Psychiatric: She has a normal mood and affect. Her behavior is normal.      Assessment & Plan:   1. Type II or unspecified type diabetes mellitus without mention of complication, uncontrolled   2. OBESITY, NOS   3. Pure hypercholesterolemia   4. Depression   5. Allergic rhinitis, cause unspecified   6. Elevated LFTs   7. Elevated BP   8. Acute maxillary sinusitis    1. DMII: uncontrolled; tolerating Januvia without side  effect; obtain labs; continue Metformin and Januvia. 2.  Obesity: persistent; did not undergo bariatric surgery consultation due to  upfront expense. 3.  Hypercholesterolemia: controlled; obtain labs; continue current medication; pt did not hold statin with elevated LFTs. 4.  Depression: stable on current regimen; no change in therapy at this time. 5.  Allergic Rhinitis: uncontrolled despite Zyrtec, Flonase, Astelin; add Singulair. 6.  Elevated LFTs: persistent; s/p abdominal u/s that revealed fatty liver secondary to obesity; follow-up with GI in 05/2014; recommend discussing with physician to rule out other contributing etiology to elevated LFTs.  Recommend weight loss, low-fat food choices.  Hepatitis B series UTD; consider Hepatitis A series. 7.  Elevated BP: persistent; recommend weight loss, exercise. 8. Arthralgias: persistent; scheduled for rheumatology consultation in upcoming months. 9. Acute sinusitis/bronchitis: persistent; rx for Doxycycline provided.  Diflucan also provided.  Meds ordered this encounter  Medications  . montelukast (SINGULAIR) 10 MG tablet    Sig: Take 1 tablet (10 mg total) by mouth at bedtime.    Dispense:  30 tablet    Refill:  3  . doxycycline (VIBRAMYCIN) 100 MG capsule    Sig: Take 1 capsule (100 mg total) by mouth 2 (two) times daily.    Dispense:  20 capsule    Refill:  0  . fluconazole (DIFLUCAN) 150 MG tablet    Sig: Take 1 tablet (150 mg total) by mouth once. Repeat if needed    Dispense:  2 tablet    Refill:  0     I personally performed the services described in this documentation, which was scribed in my presence.  The recorded information has been reviewed and is accurate.  Reginia Forts, M.D.  [facility] [address] [place], [place]  [address] [phone] phone [phone] fax

## 2014-03-30 ENCOUNTER — Ambulatory Visit (INDEPENDENT_AMBULATORY_CARE_PROVIDER_SITE_OTHER): Payer: 59 | Admitting: Family Medicine

## 2014-03-30 VITALS — BP 128/86 | Wt 241.0 lb

## 2014-03-30 DIAGNOSIS — E119 Type 2 diabetes mellitus without complications: Secondary | ICD-10-CM

## 2014-03-30 LAB — LIPASE: LIPASE: 36 U/L (ref 0–75)

## 2014-03-30 LAB — AMYLASE: AMYLASE: 32 U/L (ref 0–105)

## 2014-03-30 NOTE — Progress Notes (Signed)
Patient presents today for 3 month diabetes follow-up as part of the employer-sponsored [employer] to [employer] program. Current diabetes regimen includes Metformin and Januvia. Patient also continues on daily ASA and statin. Most recent MD follow-up was this week. Patient has a pending appt for August. No med changes or major health changes at this time.  Diabetes Assessment: Type of Diabetes: Type 2; Sees Diabetes provider 4 or more times per year; MD managing Diabetes Loanne Drilling, MD; takes medications as prescribed; checks feet daily; uses glucometer; takes an aspirin a day Other Diabetes History: Current med regimen includes Metformin 500 mg two tablets twice daily and most recently Januvia 50 mg daily. Patient would like to switch to Metformin 1000 mg tabs to decrease pill burden. She will request this at next refill. Patient does maintain good medication compliance. Patient did not bring meter today but is currently testing once daily. Per pateint report fasting ranging 120-160s, usually 140-150 range. Hypoglycemia frequency is rare. Patient denies signs and symptoms of neuropathy including numbness/tingling/burning and symptoms of foot infection. She does report mild and infrequent stabbing pain in her second toe. It is difficult to determine if this is the start of a neuropathy. I have asked pt to continue to monitor this. Up to date on eye exam.   Lifestyle Factors: Diet - A few minor changes. Patient is not eating out as much, now cooking more at home. Pt is aware of the need for healthier choices and smaller portion sizes. She did show interest in making a weekly meal menu to help her eat healthier. We have also discussed options for increased vegetables.  Breakfast - two egg whites, fruit, etc Lunch - salad, weight watchers frozen dinners Supper - sandwich, grilled cheese, PBJ, sometimes a vegetable  Exercise - Not exercising consistently at this time. She was riding stationary bike approximately  4 days per week, but has not exercised in a while. Pt is aware of the importance of exericse. She is also seeing a rheumatologist soon for arthritis pain and hopefully this will improve her ability to exercise.   Assessment: Patient has allowed control of DM to deteriorate, A1c was not done today, as it was drawn at MD office this past Monday. Results have not been given yet. She continues to have room for improvement in both diet and exercise, but is making improvements to diet. Patient continues to monitor glucose and is now testing once daily. Patient seems motivated at this visit to make changes to diet and exercise and I am hopeful she will be able to maintain these.  Plan: 1) Continue making healthy dietary choices, attempt to increase vegetables 2) Exercise as tolerated 3) Continue testing at least once daily 4) Continue taking medications as prescribed 5) Follow-up in 3 months on Wednesday August 19th @ 10:00 am

## 2014-03-31 ENCOUNTER — Encounter: Payer: Self-pay | Admitting: Family Medicine

## 2014-03-31 MED ORDER — SITAGLIPTIN PHOSPHATE 100 MG PO TABS: 100.0000 mg | ORAL_TABLET | Freq: Every day | ORAL | Status: DC

## 2014-03-31 NOTE — Addendum Note (Signed)
Addended by: [name] on: 03/31/2014 10:31 AM   Modules accepted: Orders

## 2014-04-06 ENCOUNTER — Encounter: Payer: Self-pay | Admitting: Family Medicine

## 2014-04-11 ENCOUNTER — Ambulatory Visit (INDEPENDENT_AMBULATORY_CARE_PROVIDER_SITE_OTHER): Payer: 59 | Admitting: Internal Medicine

## 2014-04-11 ENCOUNTER — Encounter: Payer: Self-pay | Admitting: Internal Medicine

## 2014-04-11 VITALS — BP 110/80 | HR 68 | Ht 61.0 in | Wt 241.4 lb

## 2014-04-11 DIAGNOSIS — R143 Flatulence: Secondary | ICD-10-CM

## 2014-04-11 DIAGNOSIS — R142 Eructation: Secondary | ICD-10-CM

## 2014-04-11 DIAGNOSIS — K760 Fatty (change of) liver, not elsewhere classified: Secondary | ICD-10-CM

## 2014-04-11 DIAGNOSIS — K7689 Other specified diseases of liver: Secondary | ICD-10-CM

## 2014-04-11 DIAGNOSIS — K219 Gastro-esophageal reflux disease without esophagitis: Secondary | ICD-10-CM

## 2014-04-11 DIAGNOSIS — R141 Gas pain: Secondary | ICD-10-CM

## 2014-04-11 DIAGNOSIS — R1013 Epigastric pain: Secondary | ICD-10-CM

## 2014-04-11 NOTE — Progress Notes (Signed)
HISTORY OF PRESENT ILLNESS:  Cristina [name] is a 53 y.o. female with multiple significant medical problems as listed below. Cristina [name] presents today with a chief complaint of epigastric discomfort that has been going on for 3-4 months. Cristina [name] describes this as a dullness which is worse after large meals. Cristina [name] states that it feels like gas. Cristina [name] mentions that belching or passing flatus completely relieves his symptoms. Cristina [name] is on PPI for GERD. No GERD symptoms. Cristina [name] was last evaluated in this office May 2013 for similar discomfort in the left upper quadrant, GERD, and surveillance colonoscopy. Her PPI was adjusted.  Colonoscopy was performed August 2013 and was normal. Her last upper endoscopy March 2012 was unremarkable (minor antral erythema, CLO negative). CT scan November 2013 revealed fatty liver. Recently seen by her PCP. Abdominal ultrasound was performed and revealed fatty liver. Blood work from 03/28/2014 revealed ALT of 63 and normal platelets. Last hemoglobin A1c 8.4. No other GI complaints. Cristina [name] is accompanied by her grandson  REVIEW OF SYSTEMS:  All non-GI ROS negative except for arthritis  Past Medical History  Diagnosis Date  . Hemorrhoid   . Fissure, anal   . Fibroids     uterine  . Cystocele   . Rectocele   . Diabetes mellitus   . Genital herpes   . Arthritis   . Depression   . Hyperlipidemia   . Obesity   . GERD (gastroesophageal reflux disease)   . CTS (carpal tunnel syndrome)   . EP (ectopic pregnancy) 1989  . Hx of migraine headaches   . Dysfunction of eustachian tube   . Constipation   . Tobacco use disorder   . Allergic rhinitis, cause unspecified   . Iron deficiency anemia, unspecified   . Insomnia   . OSA on CPAP     Past Surgical History  Procedure Laterality Date  . Cholecystectomy    . Carpal tunnel release  1989    Left  . Ectopic pregnancy surgery  1993  . Tubal ligation  1988  . Cholecystectomy    . Sleep study  09/11/2012    severe OSA.  CPAP titration at 12  cm water pressure.  . Abdominal hysterectomy  11/11/2010    [place] Plan.  Ovaries intact.  Uterine fibroids with DUB.  . Colonoscopy  06/29/2012    normal.  Repeat in 5 years.  . Esophagogastroduodenoscopy  12/13/2011    dysphagia.  Henrene Pastor.  Normal.    Social History [name]  reports that Cristina [name] has quit smoking. Cristina [name] has never used smokeless tobacco. Cristina [name] reports that Cristina [name] drinks about .6 ounces of alcohol per week. Cristina [name] reports that Cristina [name] does not use illicit drugs.  family history includes Cancer in her maternal grandfather and paternal grandmother; Cancer (age of onset: 58) in her mother; Cirrhosis in her brother; Colon cancer in her mother, paternal grandmother, and another family member; Colon polyps in her brother; Diabetes in her mother; Hypertension (age of onset: 34) in her mother; Osteoarthritis in her mother; Stroke in her maternal grandmother.  Allergies  Allergen Reactions  . Fish Oil Rash       PHYSICAL EXAMINATION: Vital signs: BP 110/80  Pulse 68  Ht 5\' 1"  (1.549 m)  Wt 241 lb 6.4 oz (109.498 kg)  BMI 45.64 kg/m2 General: Well-developed, obese, well-nourished, no acute distress HEENT: Sclerae are anicteric, conjunctiva pink. Oral mucosa intact Lungs: Clear Heart: Regular Abdomen: soft, obese, nontender, nondistended, no obvious ascites, no peritoneal signs, normal bowel sounds. No organomegaly. Extremities:  No edema Psychiatric: alert and oriented x3. Cooperative     ASSESSMENT:  #1. Gas bloat #2. Fatty liver #3. Morbid obesity #4. Family history of colon cancer. Normal colonoscopy 2013 #5. GERD. Unremarkable EGD March 2012   PLAN:  #1. Carbonated beverages to help with gas bloat #2. Eat smaller meals #3. Exercise and weight loss. #4. Discussed the potential seriousness of fatty liver disease including cirrhosis #5. Reflux precautions #6. Continue PPI #7. Repeat screening colonoscopy 2018 #8. Resume general medical care with Dr. Tamala Julian

## 2014-04-11 NOTE — Patient Instructions (Signed)
Please follow up with Dr. Perry as needed 

## 2014-05-10 MED ORDER — ROPINIROLE HCL 1 MG PO TABS: 1.0000 mg | ORAL_TABLET | Freq: Every day | ORAL | Status: DC

## 2014-05-12 ENCOUNTER — Telehealth: Payer: Self-pay

## 2014-05-12 ENCOUNTER — Ambulatory Visit (INDEPENDENT_AMBULATORY_CARE_PROVIDER_SITE_OTHER): Payer: Commercial Managed Care - PPO | Admitting: Family Medicine

## 2014-05-12 VITALS — BP 132/84 | HR 91 | Temp 99.0°F | Resp 24 | Ht 60.47 in | Wt 238.2 lb

## 2014-05-12 DIAGNOSIS — H9209 Otalgia, unspecified ear: Secondary | ICD-10-CM

## 2014-05-12 DIAGNOSIS — M797 Fibromyalgia: Secondary | ICD-10-CM | POA: Insufficient documentation

## 2014-05-12 DIAGNOSIS — R300 Dysuria: Secondary | ICD-10-CM

## 2014-05-12 DIAGNOSIS — G4700 Insomnia, unspecified: Secondary | ICD-10-CM | POA: Insufficient documentation

## 2014-05-12 DIAGNOSIS — H9202 Otalgia, left ear: Secondary | ICD-10-CM

## 2014-05-12 DIAGNOSIS — IMO0001 Reserved for inherently not codable concepts without codable children: Secondary | ICD-10-CM

## 2014-05-12 DIAGNOSIS — E119 Type 2 diabetes mellitus without complications: Secondary | ICD-10-CM

## 2014-05-12 LAB — POCT URINALYSIS DIPSTICK
Bilirubin, UA: NEGATIVE
Glucose, UA: NEGATIVE
Nitrite, UA: NEGATIVE
Protein, UA: 30
Spec Grav, UA: 1.02
Urobilinogen, UA: 0.2
pH, UA: 5

## 2014-05-12 LAB — POCT UA - MICROSCOPIC ONLY
Casts, Ur, LPF, POC: NEGATIVE
Crystals, Ur, HPF, POC: NEGATIVE
Mucus, UA: NEGATIVE
Yeast, UA: NEGATIVE

## 2014-05-12 MED ORDER — FLUCONAZOLE 150 MG PO TABS: 150.0000 mg | ORAL_TABLET | Freq: Once | ORAL | Status: DC

## 2014-05-12 MED ORDER — CIPROFLOXACIN HCL 500 MG PO TABS: 500.0000 mg | ORAL_TABLET | Freq: Two times a day (BID) | ORAL | Status: DC

## 2014-05-12 NOTE — Telephone Encounter (Signed)
Pt called and states that Dr Philemon Kingdom did a urine test and told her she had a UTI but wouldn't call in a rx for her. So she wanted to know if Dr Tamala Julian would call in a rx for her. She uses [facility] on [address]. She can be reached @[phone] . Thank you

## 2014-05-12 NOTE — Progress Notes (Signed)
Patient ID: Cristina [name], female   DOB: [birth year], 53 y.o.   MRN: [id number] This chart was scribed for [name], MD by Jeanell Sparrow, ED Scribe. This patient was seen in room Room 10 and the patient's care was started at 7:15 PM.   Subjective:    Patient ID: Cristina [name], female    DOB: [birth year], 53 y.o.   MRN: [id number]  05/12/2014  buring with urination   HPI HPI Comments: Cristina [name] is a 53 y.o. female who presents to the [facility] complaining of dysuria that started yesterday. She reports that she has intermittent nausea for 3 months now. She reports that another provider/[facility] of rheumatology reported that she has a UTI; underwent labs by Dr. Kathee Delton yesterday. She denies any hematuria or back pain.  +frequency but no urgency; no nocturia.  No flank pain. No vomiting, fever/chills/sweats.  No vaginal discharge.    Pt reports intermittent ear pain that has being going on for a while.  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  Reports L maxillary sinus tenderness for two days but denies nasal congestion, rhinorrhea, coughing, PND, sore throat.  Reports swelling along L jaw after dental procedure two weeks ago; had a filling placed with significant swelling of L jaw.  Has residual swelling with tenderness along L jaw.  S/p evaluation by Dr. Kathee Delton of rheumatology who diagnosed pt with fibromyalgia.  Dr. Kathee Delton no longer treats fibromyalgia and is going to make recommendations to PCP.  No evidence of rheumatoid arthritis.    Pt also reports intermittent episodes of hot flashes relateds to menopause.  Patient is now sleeping better with higher dose of Requip for RLS.    Review of Systems  Constitutional: Negative for fever, chills, diaphoresis and fatigue.  HENT: Positive for dental problem, ear pain, facial swelling and sinus pressure. Negative for congestion, mouth sores, postnasal drip, rhinorrhea, sore throat, trouble swallowing  and voice change.   Gastrointestinal: Positive for nausea. Negative for vomiting and abdominal pain.  Genitourinary: Positive for dysuria and frequency. Negative for urgency, hematuria, flank pain, vaginal discharge and pelvic pain.  Musculoskeletal: Positive for arthralgias and myalgias. Negative for back pain.  Skin: Negative for rash.  Psychiatric/Behavioral: Negative for sleep disturbance.    Past Medical History  Diagnosis Date  . Hemorrhoid   . Fissure, anal   . Fibroids     uterine  . Cystocele   . Rectocele   . Diabetes mellitus   . Genital herpes   . Arthritis   . Depression   . Hyperlipidemia   . Obesity   . GERD (gastroesophageal reflux disease)   . CTS (carpal tunnel syndrome)   . EP (ectopic pregnancy) 1989  . Hx of migraine headaches   . Dysfunction of eustachian tube   . Constipation   . Tobacco use disorder   . Allergic rhinitis, cause unspecified   . Iron deficiency anemia, unspecified   . Insomnia   . OSA on CPAP    Allergies  Allergen Reactions  . Fish Oil Rash   Current Outpatient Prescriptions  Medication Sig Dispense Refill  . acyclovir (ZOVIRAX) 400 MG tablet TAKE 1 TABLET BY MOUTH 2 (TWO) TIMES DAILY.  60 tablet  9  . albuterol (PROAIR HFA) 108 (90 BASE) MCG/ACT inhaler Inhale 2 puffs into the lungs every 6 (six) hours as needed.  18 g  1  . ALPRAZolam (XANAX) 0.5 MG tablet Take 1 tablet (0.5 mg total)  by mouth at bedtime as needed.  30 tablet  0  . aspirin EC 81 MG tablet Take 81 mg by mouth daily.      [name] azelastine (ASTELIN) 137 MCG/SPRAY nasal spray Place 2 sprays into both nostrils 2 (two) times daily. Use in each nostril as directed  30 mL  11  . buPROPion (WELLBUTRIN SR) 100 MG 12 hr tablet Take 100 mg by mouth daily.      . Calcium Carbonate-Vitamin D (CALCIUM + D PO) Take 1 tablet by mouth daily.      . cetirizine (ZYRTEC) 10 MG tablet Take 1 tablet (10 mg total) by mouth daily.  90 tablet  3  . cyclobenzaprine (FLEXERIL) 10 MG tablet  Take 1 tablet (10 mg total) by mouth every 8 (eight) hours as needed for muscle spasms.  90 tablet  0  . ferrous sulfate 325 (65 FE) MG EC tablet Take 325 mg by mouth daily.       . fluticasone (FLONASE) 50 MCG/ACT nasal spray Place 2 sprays into both nostrils daily.  16 g  11  . Glucosamine HCl 1000 MG TABS Take 1,000 mg by mouth 3 (three) times daily.      [name] glucose blood test strip USE TO CHECK BLOOD SUGAR DAILY. DX CODE: 250.00  100 each  3  . hydrocortisone (ANUSOL-HC) 25 MG suppository Place 1 suppository (25 mg total) rectally 2 (two) times daily as needed for hemorrhoids.  25 suppository  5  . ibuprofen (ADVIL,MOTRIN) 800 MG tablet TAKE 1 TABLET BY MOUTH EVERY 8 HOURS AS NEEDED  40 tablet  4  . Lancets MISC Use to check blood sugar daily. Dx code: 250.00  100 each  3  . meloxicam (MOBIC) 15 MG tablet Take 1 tablet (15 mg total) by mouth daily. With food.  30 tablet  3  . metFORMIN (GLUCOPHAGE) 500 MG tablet Take 2 tablets (1,000 mg total) by mouth 2 (two) times daily with a meal.  360 tablet  3  . montelukast (SINGULAIR) 10 MG tablet Take 1 tablet (10 mg total) by mouth at bedtime.  30 tablet  3  . Multiple Vitamin (MULTIVITAMIN) capsule Take 1 capsule by mouth daily.      . pantoprazole (PROTONIX) 40 MG tablet Take 1 tablet (40 mg total) by mouth 2 (two) times daily.  180 tablet  3  . rOPINIRole (REQUIP) 1 MG tablet Take 1 tablet (1 mg total) by mouth at bedtime.  90 tablet  3  . senna (SENOKOT) 8.6 MG tablet Take 1 tablet by mouth daily.      . sertraline (ZOLOFT) 100 MG tablet Take 1 tablet (100 mg total) by mouth daily.  90 tablet  3  . simvastatin (ZOCOR) 40 MG tablet Take 1 tablet (40 mg total) by mouth daily at 6 PM.  90 tablet  3  . sitaGLIPtin (JANUVIA) 100 MG tablet Take 1 tablet (100 mg total) by mouth daily.  30 tablet  5  . zolpidem (AMBIEN) 5 MG tablet Take 1 tablet (5 mg total) by mouth at bedtime as needed for sleep.  30 tablet  5  . ciprofloxacin (CIPRO) 500 MG tablet Take  1 tablet (500 mg total) by mouth 2 (two) times daily.  14 tablet  0  . fluconazole (DIFLUCAN) 150 MG tablet Take 1 tablet (150 mg total) by mouth once. Repeat if needed  2 tablet  0   No current facility-administered medications for this visit.   History  Social History  . Marital Status: Single    Spouse Name: N/A    Number of Children: 3  . Years of Education: college   Occupational History  . NURSE SECRETARY     [employer]   Social History Main Topics  . Smoking status: Former Research scientist (life sciences)  . Smokeless tobacco: Never Used     Comment: smoked occasionally longest 6 mos.  . Alcohol Use: 0.6 oz/week    1 Glasses of wine per week     Comment: 2 x monthly drinks wine  . Drug Use: No  . Sexual Activity: No   Other Topics Concern  . Not on file   Social History Narrative   Drinks coffee and soda's at least one a day.    Marital status:  Divorced in 2000 after six years of marriage; not dating.      Children:  3 daughters (35, 64, 57); 5 grandchildren.      Lives: alone.  2 dogs      Employment:  Working at [employer] as [employer] with [employer] since 2012.      Tobacco: none      Alcohol: socially; special occasions.      Drugs: none      Exercise:  Sporadic; stationary bike.      Sexual activity:  Not sexually active since 2011.       Guns:  No guns in the home.       Smoke detectors in use,       Sometimes uses seat belts.        Objective:    BP 132/84  Pulse 91  Temp(Src) 99 F (37.2 C) (Oral)  Resp 24  Ht 5' 0.47" (1.536 m)  Wt 238 lb 4 oz (108.069 kg)  BMI 45.81 kg/m2  SpO2 97% Physical Exam  Nursing note and vitals reviewed. Constitutional: She is oriented to person, place, and time. She appears well-developed and well-nourished. No distress.  HENT:  Head: Normocephalic and atraumatic.  Right Ear: External ear normal.  Left Ear: External ear normal.  Nose: Nose normal.  Mouth/Throat: Oropharynx is clear and moist.  Local swelling along left mandible  Eyes:  Conjunctivae and EOM are normal. Pupils are equal, round, and reactive to light.  Neck: Normal range of motion. Neck supple. Carotid bruit is not present. No tracheal deviation present. No thyromegaly present.  Cardiovascular: Normal rate, regular rhythm, normal heart sounds and intact distal pulses.  Exam reveals no gallop and no friction rub.   No murmur heard. Pulmonary/Chest: Effort normal and breath sounds normal. No respiratory distress. She has no wheezes. She has no rales.  Abdominal: Soft. Bowel sounds are normal. She exhibits no distension and no mass. There is no tenderness. There is no rebound and no guarding.  Genitourinary:  No CVA tenderness  Musculoskeletal: Normal range of motion.  Lymphadenopathy:    She has no cervical adenopathy.  Neurological: She is alert and oriented to person, place, and time. No cranial nerve deficit.  Skin: Skin is warm and dry. No rash noted. She is not diaphoretic. No erythema. No pallor.  Psychiatric: She has a normal mood and affect. Her behavior is normal.   Results for orders placed in visit on 05/12/14  POCT URINALYSIS DIPSTICK      Result Value Ref Range   Color, UA yellow     Clarity, UA cloudy     Glucose, UA neg     Bilirubin, UA neg     Ketones,  UA trace     Spec Grav, UA 1.020     Blood, UA moderate     pH, UA 5.0     Protein, UA 30     Urobilinogen, UA 0.2     Nitrite, UA neg     Leukocytes, UA small (1+)    POCT UA - MICROSCOPIC ONLY      Result Value Ref Range   WBC, Ur, HPF, POC tntc     RBC, urine, microscopic 0-3     Bacteria, U Microscopic 3+     Mucus, UA neg     Epithelial cells, urine per micros 10-15     Crystals, Ur, HPF, POC neg     Casts, Ur, LPF, POC neg     Yeast, UA neg         Assessment & Plan:  Burning with urination - Plan: POCT Urinalysis Dipstick, POCT UA - Microscopic Only, Urine culture  DM  Fibromyalgia  Ear pain, left  Insomnia  1. Dysuria:  New. Send urine culture; treat  empirically with Cipro 500mg  bid x 7 days. RTC for fever, vomiting, flank pain. 2.  DMII: moderately controlled.  Monitor sugars closely with acute infection. 3.  Fibromyalgia:  New. Diagnosed by Dr. Kathee Delton. Will await consultation note by rheumatology prior to making recommendations for treatment. 4.  L ear pain:  Recurrent. Benign ear exam in office; known TMJ that frequently causes ear pain. Recommend mouth guard qhs. 5.  Insomnia: improved with increased dose of Requip.   Meds ordered this encounter  Medications  . buPROPion (WELLBUTRIN SR) 100 MG 12 hr tablet    Sig: Take 100 mg by mouth daily.  . ciprofloxacin (CIPRO) 500 MG tablet    Sig: Take 1 tablet (500 mg total) by mouth 2 (two) times daily.    Dispense:  14 tablet    Refill:  0  . fluconazole (DIFLUCAN) 150 MG tablet    Sig: Take 1 tablet (150 mg total) by mouth once. Repeat if needed    Dispense:  2 tablet    Refill:  0    No Follow-up on file.  I personally performed the services described in this documentation, which was scribed in my presence.  The recorded information has been reviewed and is accurate.  Reginia Forts, M.D.  [facility] [address] [place], [place]  [address] [phone] phone [phone] fax

## 2014-05-12 NOTE — Patient Instructions (Signed)

## 2014-05-13 NOTE — Telephone Encounter (Signed)
Pt came in to be seen 05/12/14

## 2014-05-16 LAB — URINE CULTURE

## 2014-05-17 MED ORDER — SULFAMETHOXAZOLE-TRIMETHOPRIM 800-160 MG PO TABS
1.0000 | ORAL_TABLET | Freq: Two times a day (BID) | ORAL | Status: DC
Start: 2014-05-17 — End: 2014-06-29

## 2014-05-17 NOTE — Addendum Note (Signed)
Addended by: [name] on: 05/17/2014 12:05 PM   Modules accepted: Orders

## 2014-06-01 ENCOUNTER — Encounter: Payer: Self-pay | Admitting: Family Medicine

## 2014-06-02 ENCOUNTER — Telehealth: Payer: Self-pay | Admitting: Family Medicine

## 2014-06-02 ENCOUNTER — Other Ambulatory Visit: Payer: Self-pay | Admitting: *Deleted

## 2014-06-02 ENCOUNTER — Other Ambulatory Visit: Payer: Self-pay | Admitting: Family Medicine

## 2014-06-02 MED ORDER — CYCLOBENZAPRINE HCL 10 MG PO TABS: 10.0000 mg | ORAL_TABLET | Freq: Three times a day (TID) | ORAL | Status: DC | PRN

## 2014-06-03 ENCOUNTER — Other Ambulatory Visit: Payer: Self-pay | Admitting: Family Medicine

## 2014-06-03 ENCOUNTER — Encounter: Payer: Self-pay | Admitting: Family Medicine

## 2014-06-04 MED ORDER — ALPRAZOLAM 0.5 MG PO TABS: 0.5000 mg | ORAL_TABLET | Freq: Every evening | ORAL | Status: DC | PRN

## 2014-06-04 MED ORDER — BUPROPION HCL ER (SR) 100 MG PO TB12: 100.0000 mg | ORAL_TABLET | Freq: Every day | ORAL | Status: DC

## 2014-06-04 NOTE — Telephone Encounter (Signed)
Please fax in rx for Xanax to [facility]

## 2014-06-06 NOTE — Telephone Encounter (Signed)
faxed

## 2014-06-06 NOTE — Telephone Encounter (Signed)
Called in Rx

## 2014-06-22 ENCOUNTER — Other Ambulatory Visit: Payer: Self-pay | Admitting: Internal Medicine

## 2014-06-23 ENCOUNTER — Telehealth: Payer: Self-pay | Admitting: Internal Medicine

## 2014-06-23 NOTE — Telephone Encounter (Signed)
Medication called into [facility] as requested by patient  Diltizem cream 2 percent with 1 refill

## 2014-06-28 ENCOUNTER — Telehealth: Payer: Self-pay

## 2014-06-28 NOTE — Telephone Encounter (Signed)
lmom as a reminder for pt it will take 5-7 days for completion of fmla PPW, also she will need to make a payment for her ppw being completed.

## 2014-06-28 NOTE — Telephone Encounter (Signed)
Pt's FMLA ppw dropped off in Dr. Thompson Caul box for completion in 5-7 business days. Payment for this has not yet been received, called to notify pt, but her mailbox was full.

## 2014-06-29 ENCOUNTER — Ambulatory Visit (INDEPENDENT_AMBULATORY_CARE_PROVIDER_SITE_OTHER): Payer: 59 | Admitting: Family Medicine

## 2014-06-29 ENCOUNTER — Encounter: Payer: Self-pay | Admitting: Family Medicine

## 2014-06-29 ENCOUNTER — Ambulatory Visit (INDEPENDENT_AMBULATORY_CARE_PROVIDER_SITE_OTHER): Payer: Self-pay | Admitting: Family Medicine

## 2014-06-29 VITALS — BP 158/93 | HR 86 | Temp 98.9°F | Resp 18 | Ht 60.75 in | Wt 242.0 lb

## 2014-06-29 VITALS — BP 134/92 | Wt 239.0 lb

## 2014-06-29 DIAGNOSIS — IMO0001 Reserved for inherently not codable concepts without codable children: Secondary | ICD-10-CM

## 2014-06-29 DIAGNOSIS — F329 Major depressive disorder, single episode, unspecified: Secondary | ICD-10-CM

## 2014-06-29 DIAGNOSIS — M159 Polyosteoarthritis, unspecified: Secondary | ICD-10-CM

## 2014-06-29 DIAGNOSIS — G2581 Restless legs syndrome: Secondary | ICD-10-CM

## 2014-06-29 DIAGNOSIS — F32A Depression, unspecified: Secondary | ICD-10-CM

## 2014-06-29 DIAGNOSIS — E119 Type 2 diabetes mellitus without complications: Secondary | ICD-10-CM

## 2014-06-29 DIAGNOSIS — M8949 Other hypertrophic osteoarthropathy, multiple sites: Secondary | ICD-10-CM

## 2014-06-29 DIAGNOSIS — E118 Type 2 diabetes mellitus with unspecified complications: Secondary | ICD-10-CM

## 2014-06-29 DIAGNOSIS — E7800 Pure hypercholesterolemia, unspecified: Secondary | ICD-10-CM

## 2014-06-29 DIAGNOSIS — M150 Primary generalized (osteo)arthritis: Secondary | ICD-10-CM

## 2014-06-29 DIAGNOSIS — G4700 Insomnia, unspecified: Secondary | ICD-10-CM

## 2014-06-29 DIAGNOSIS — M797 Fibromyalgia: Secondary | ICD-10-CM

## 2014-06-29 DIAGNOSIS — F3289 Other specified depressive episodes: Secondary | ICD-10-CM

## 2014-06-29 DIAGNOSIS — I10 Essential (primary) hypertension: Secondary | ICD-10-CM

## 2014-06-29 LAB — COMPLETE METABOLIC PANEL WITH GFR
ALBUMIN: 3.8 g/dL (ref 3.5–5.2)
ALT: 55 U/L — ABNORMAL HIGH (ref 0–35)
AST: 43 U/L — ABNORMAL HIGH (ref 0–37)
Alkaline Phosphatase: 68 U/L (ref 39–117)
BUN: 12 mg/dL (ref 6–23)
CALCIUM: 9.3 mg/dL (ref 8.4–10.5)
CHLORIDE: 103 meq/L (ref 96–112)
CO2: 25 meq/L (ref 19–32)
CREATININE: 0.67 mg/dL (ref 0.50–1.10)
GLUCOSE: 153 mg/dL — AB (ref 70–99)
POTASSIUM: 4.1 meq/L (ref 3.5–5.3)
Sodium: 140 mEq/L (ref 135–145)
Total Bilirubin: 0.4 mg/dL (ref 0.2–1.2)
Total Protein: 7.3 g/dL (ref 6.0–8.3)

## 2014-06-29 LAB — LIPID PANEL
Cholesterol: 159 mg/dL (ref 0–200)
HDL: 44 mg/dL (ref >39)
LDL Cholesterol: 81 mg/dL (ref 0–99)
Total CHOL/HDL Ratio: 3.6 Ratio
Triglycerides: 172 mg/dL — ABNORMAL HIGH (ref <150)
VLDL: 34 mg/dL (ref 0–40)

## 2014-06-29 LAB — CBC WITH DIFFERENTIAL/PLATELET
Basophils Absolute: 0 10*3/uL (ref 0.0–0.1)
Basophils Relative: 0 % (ref 0–1)
EOS PCT: 2 % (ref 0–5)
Eosinophils Absolute: 0.2 10*3/uL (ref 0.0–0.7)
HCT: 36.6 % (ref 36.0–46.0)
HEMOGLOBIN: 12 g/dL (ref 12.0–15.0)
LYMPHS PCT: 31 % (ref 12–46)
Lymphs Abs: 3.1 10*3/uL (ref 0.7–4.0)
MCH: 26.7 pg (ref 26.0–34.0)
MCHC: 32.8 g/dL (ref 30.0–36.0)
MCV: 81.5 fL (ref 78.0–100.0)
MONOS PCT: 5 % (ref 3–12)
Monocytes Absolute: 0.5 10*3/uL (ref 0.1–1.0)
NEUTROS ABS: 6.2 10*3/uL (ref 1.7–7.7)
Neutrophils Relative %: 62 % (ref 43–77)
Platelets: 339 10*3/uL (ref 150–400)
RBC: 4.49 MIL/uL (ref 3.87–5.11)
RDW: 15.3 % (ref 11.5–15.5)
WBC: 10 10*3/uL (ref 4.0–10.5)

## 2014-06-29 LAB — HEMOGLOBIN A1C
HEMOGLOBIN A1C: 8.1 % — AB (ref <5.7)
MEAN PLASMA GLUCOSE: 186 mg/dL — AB (ref <117)

## 2014-06-29 MED ORDER — SERTRALINE HCL 100 MG PO TABS: 150.0000 mg | ORAL_TABLET | Freq: Every day | ORAL | Status: DC

## 2014-06-29 MED ORDER — SITAGLIPTIN PHOS-METFORMIN HCL 50-1000 MG PO TABS: 1.0000 | ORAL_TABLET | Freq: Two times a day (BID) | ORAL | Status: DC

## 2014-06-29 NOTE — Progress Notes (Signed)
Subjective:    Patient ID: Cristina [name], female    DOB: Apr 08, 1961, 53 y.o.   MRN: [id number] This chart was scribed for [name], MD by Cathie Hoops, ED Scribe. The patient was seen in Room 21. The patient's care was started at 8:21 AM.   06/29/2014  3 month check up, Diabetes, Hyperlipidemia and insomnia   Diabetes Pertinent negatives for hypoglycemia include no dizziness, headaches, pallor, seizures, speech difficulty or tremors. Associated symptoms include chest pain. Pertinent negatives for diabetes include no fatigue, no polydipsia, no polyphagia, no polyuria and no weakness.  Hyperlipidemia Associated symptoms include chest pain. Pertinent negatives include no shortness of breath.   HPI Comments: Cristina [name] is a 53 y.o. female who presents to the [facility] for follow-up visit. Pt reports her home blood sugars run from 120-160 fasting. Pt reports she checked her blood sugar this morning and it was 164.  Management made at last visit included increasing Januvia to 169m daily; compliance with Metformin 10060mbid.    Pt states she is having some mild joint pain in lower extremities. Pt reports she went to sports medicine in 11/2013 and she was told she has arthritis in toes bilaterally, feet bilaterally, knees bilaterally, fingers bilaterally, and hips bilaterally.   Pt denies checking her BP.   Pt reports her sleeping has improved and states she is able to sleep. Pt is compliant to Requip and Zoloft. Pt also reports taking Requip and Ambien at night.   Pt reports she takes her Wellbutrin during the day. Pt reports she has about 4-5 bad days/month. Pt states she does miss work due to her depression, once every 3 months. Pt states she has some occasional SI. Pt states her grandson is the reason she doesn't act out her SI. Pt denies wanting to seek out counseling. [place]    Pt  states she takes Vitamin D, Calcium, CoQ10, Magnesium, and Biotin per Dr. DeKathee Deltonho recently diagnosed her with fibromyalgia.  Patient is to follow-up next month with Dr. DeKathee DeltonNo medications were prescribed by Dr. DeKathee Deltonvitamins were only recommended. Lots of blood work was obtained at initial visit with rheumatology.  Pt denies taking about medications. Pt reports the doctor wanted her to try her on the vitamins and then send her recommendations to Dr. SmTamala JulianPt reports she has Meloxicam at home.   Pt reports she was having some anal bleeding and pain that has since resolved. Pt reports she saw Dr. PeHenrene Pastorn 04/11/2014 with normal findings.   Pt reports taking her allergy medicines regularly, pt denies her allergies are currently bothering her.   Pt states she had some moderate left chest pain and left jaw pain before dinner. Pt denies SOB. Pt reports she is exercising via riding a bike 3-5x/week. Pt reports tingling, numbness, and pain in her right toes. Pt denies swelling in her toes. Pt denies having chest pain while riding her bike.   Pt reports wanting FMLA paperwork completed.   [place] At last visit, switched her Januvia to 100 MG. She had a Hemoglobin and A1C of 8.4. Cholesterol is under-control.  Review of Systems  Constitutional: Negative for fever, chills, diaphoresis and fatigue.  HENT: Negative for congestion, rhinorrhea, sinus pressure and sneezing.   Eyes: Negative  for visual disturbance.  Respiratory: Negative for cough and shortness of breath.   Cardiovascular: Positive for chest pain. Negative for palpitations and leg swelling.  Gastrointestinal: Negative for nausea, vomiting, abdominal pain, diarrhea, constipation and anal bleeding.  Endocrine: Negative for cold intolerance, heat intolerance, polydipsia, polyphagia and polyuria.  Musculoskeletal: Positive for  arthralgias (joint pain). Negative for joint swelling.  Skin: Negative for color change, pallor, rash and wound.  Neurological: Negative for dizziness, tremors, seizures, syncope, facial asymmetry, speech difficulty, weakness, light-headedness, numbness and headaches.  Psychiatric/Behavioral: Positive for suicidal ideas, sleep disturbance and dysphoric mood. Negative for self-injury.  All other systems reviewed and are negative.  Past Medical History  Diagnosis Date  . Hemorrhoid   . Fissure, anal   . Fibroids     uterine  . Cystocele   . Rectocele   . Diabetes mellitus   . Genital herpes   . Arthritis   . Depression   . Hyperlipidemia   . Obesity   . GERD (gastroesophageal reflux disease)   . CTS (carpal tunnel syndrome)   . EP (ectopic pregnancy) 1989  . Hx of migraine headaches   . Dysfunction of eustachian tube   . Constipation   . Tobacco use disorder   . Allergic rhinitis, cause unspecified   . Iron deficiency anemia, unspecified   . Insomnia   . OSA on CPAP    Past Surgical History  Procedure Laterality Date  . Cholecystectomy    . Carpal tunnel release  1989    Left  . Ectopic pregnancy surgery  1993  . Tubal ligation  1988  . Cholecystectomy    . Sleep study  09/11/2012    severe OSA.  CPAP titration at 12 cm water pressure.  . Abdominal hysterectomy  11/11/2010    [place] Plan.  Ovaries intact.  Uterine fibroids with DUB.  . Colonoscopy  06/29/2012    normal.  Repeat in 5 years.  . Esophagogastroduodenoscopy  12/13/2011    dysphagia.  Henrene Pastor.  Normal.   Allergies  Allergen Reactions  . Fish Oil Rash   Current Outpatient Prescriptions  Medication Sig Dispense Refill  . acyclovir (ZOVIRAX) 400 MG tablet TAKE 1 TABLET BY MOUTH 2 (TWO) TIMES DAILY.  60 tablet  9  . albuterol (PROAIR HFA) 108 (90 BASE) MCG/ACT inhaler Inhale 2 puffs into the lungs every 6 (six) hours as needed.  18 g  1  . ALPRAZolam (XANAX) 0.5 MG tablet Take 1 tablet (0.5 mg total) by mouth at  bedtime as needed.  30 tablet  0  . AMBULATORY NON FORMULARY MEDICATION Medication Name: Diltizem cream 2% apply as needed to rectum      . aspirin EC 81 MG tablet Take 81 mg by mouth daily.      [name] azelastine (ASTELIN) 137 MCG/SPRAY nasal spray Place 2 sprays into both nostrils 2 (two) times daily. Use in each nostril as directed  30 mL  11  . BIOTIN 5000 PO Take by mouth.      [name] buPROPion (WELLBUTRIN SR) 100 MG 12 hr tablet Take 1 tablet (100 mg total) by mouth daily.  90 tablet  3  . Calcium Carbonate-Vitamin D (CALCIUM + D PO) Take 1 tablet by mouth daily.      . cetirizine (ZYRTEC) 10 MG tablet Take 1 tablet (10 mg total) by mouth daily.  90 tablet  3  . Coenzyme Q10 (CO Q 10) 100 MG CAPS Take 400 mg by mouth 3 (three) times daily.      [name]  cyclobenzaprine (FLEXERIL) 10 MG tablet Take 1 tablet (10 mg total) by mouth every 8 (eight) hours as needed for muscle spasms.  60 tablet  0  . ferrous sulfate 325 (65 FE) MG EC tablet Take 325 mg by mouth daily.       . fluticasone (FLONASE) 50 MCG/ACT nasal spray Place 2 sprays into both nostrils daily.  16 g  11  . Glucosamine HCl 1000 MG TABS Take 1,000 mg by mouth 3 (three) times daily.      [name] glucose blood test strip USE TO CHECK BLOOD SUGAR DAILY. DX CODE: 250.00  100 each  3  . hydrocortisone (ANUSOL-HC) 25 MG suppository Place 1 suppository (25 mg total) rectally 2 (two) times daily as needed for hemorrhoids.  25 suppository  5  . ibuprofen (ADVIL,MOTRIN) 800 MG tablet TAKE 1 TABLET BY MOUTH EVERY 8 HOURS AS NEEDED  40 tablet  4  . Magnesium 500 MG CAPS Take 1,000 mg by mouth.      . meloxicam (MOBIC) 15 MG tablet Take 1 tablet (15 mg total) by mouth daily. With food.  30 tablet  3  . metFORMIN (GLUCOPHAGE) 500 MG tablet Take 2 tablets (1,000 mg total) by mouth 2 (two) times daily with a meal.  360 tablet  3  . montelukast (SINGULAIR) 10 MG tablet Take 1 tablet (10 mg total) by mouth at bedtime.  30 tablet  3  . Multiple Vitamin (MULTIVITAMIN)  capsule Take 1 capsule by mouth daily.      . pantoprazole (PROTONIX) 40 MG tablet Take 1 tablet (40 mg total) by mouth 2 (two) times daily.  180 tablet  3  . rOPINIRole (REQUIP) 1 MG tablet Take 1 tablet (1 mg total) by mouth at bedtime.  90 tablet  3  . senna (SENOKOT) 8.6 MG tablet Take 1 tablet by mouth daily.      . sertraline (ZOLOFT) 100 MG tablet Take 1.5 tablets (150 mg total) by mouth daily.  135 tablet  3  . simvastatin (ZOCOR) 40 MG tablet Take 1 tablet (40 mg total) by mouth daily at 6 PM.  90 tablet  3  . sitaGLIPtin (JANUVIA) 100 MG tablet Take 1 tablet (100 mg total) by mouth daily.  30 tablet  5  . sulfamethoxazole-trimethoprim (BACTRIM DS,SEPTRA DS) 800-160 MG per tablet Take 1 tablet by mouth 2 (two) times daily.  14 tablet  0  . thiamine (VITAMIN B-1) 100 MG tablet Take 100 mg by mouth daily.      . TRUEPLUS LANCETS 33G MISC USE TO CHECK BLOOD GLUCOSE DAILY AS DIRECTED  100 each  3  . zolpidem (AMBIEN) 5 MG tablet Take 1 tablet (5 mg total) by mouth at bedtime as needed for sleep.  30 tablet  5  . ciprofloxacin (CIPRO) 500 MG tablet Take 1 tablet (500 mg total) by mouth 2 (two) times daily.  14 tablet  0  . fluconazole (DIFLUCAN) 150 MG tablet Take 1 tablet (150 mg total) by mouth once. Repeat if needed  2 tablet  0  . sitaGLIPtin-metformin (JANUMET) 50-1000 MG per tablet Take 1 tablet by mouth 2 (two) times daily with a meal.  180 tablet  3   No current facility-administered medications for this visit.       Objective:  Triage Vitals: BP 158/93  Pulse 86  Temp(Src) 98.9 F (37.2 C) (Oral)  Resp 18  Ht 5' 0.75" (1.543 m)  Wt 242 lb (109.77 kg)  BMI 46.11 kg/m2  SpO2 97%  Physical Exam  Nursing note and vitals reviewed. Constitutional: She is oriented to person, place, and time. She appears well-developed and well-nourished. No distress.  HENT:  Head: Normocephalic and atraumatic.  Right Ear: External ear normal.  Left Ear: External ear normal.  Nose: Nose  normal.  Mouth/Throat: Oropharynx is clear and moist.  Tender to palpation left TMJ.  Eyes: Conjunctivae and EOM are normal. Pupils are equal, round, and reactive to light.  Neck: Normal range of motion. Neck supple. No JVD present. Carotid bruit is not present. No tracheal deviation present. No thyromegaly present.  Cardiovascular: Normal rate, regular rhythm, normal heart sounds and intact distal pulses.  Exam reveals no gallop and no friction rub.   No murmur heard. Pulmonary/Chest: Effort normal and breath sounds normal. No respiratory distress. She has no wheezes. She has no rales.  Abdominal: Soft. Bowel sounds are normal. She exhibits no distension and no mass. There is no tenderness. There is no rebound and no guarding.  Musculoskeletal: Normal range of motion.  Lymphadenopathy:    She has no cervical adenopathy.  Neurological: She is alert and oriented to person, place, and time. She has normal reflexes. No cranial nerve deficit or sensory deficit. Gait normal.  Skin: Skin is warm and dry. No rash noted. She is not diaphoretic. No erythema. No pallor.  Psychiatric: She has a normal mood and affect. Her behavior is normal. Judgment and thought content normal.   Results for orders placed in visit on 06/29/14  CBC WITH DIFFERENTIAL      Result Value Ref Range   WBC 10.0  4.0 - 10.5 K/uL   RBC 4.49  3.87 - 5.11 MIL/uL   Hemoglobin 12.0  12.0 - 15.0 g/dL   HCT 36.6  36.0 - 46.0 %   MCV 81.5  78.0 - 100.0 fL   MCH 26.7  26.0 - 34.0 pg   MCHC 32.8  30.0 - 36.0 g/dL   RDW 15.3  11.5 - 15.5 %   Platelets 339  150 - 400 K/uL   Neutrophils Relative % 62  43 - 77 %   Neutro Abs 6.2  1.7 - 7.7 K/uL   Lymphocytes Relative 31  12 - 46 %   Lymphs Abs 3.1  0.7 - 4.0 K/uL   Monocytes Relative 5  3 - 12 %   Monocytes Absolute 0.5  0.1 - 1.0 K/uL   Eosinophils Relative 2  0 - 5 %   Eosinophils Absolute 0.2  0.0 - 0.7 K/uL   Basophils Relative 0  0 - 1 %   Basophils Absolute 0.0  0.0 - 0.1  K/uL   Smear Review Criteria for review not met    COMPLETE METABOLIC PANEL WITH GFR      Result Value Ref Range   Sodium 140  135 - 145 mEq/L   Potassium 4.1  3.5 - 5.3 mEq/L   Chloride 103  96 - 112 mEq/L   CO2 25  19 - 32 mEq/L   Glucose, Bld 153 (*) 70 - 99 mg/dL   BUN 12  6 - 23 mg/dL   Creat 0.67  0.50 - 1.10 mg/dL   Total Bilirubin 0.4  0.2 - 1.2 mg/dL   Alkaline Phosphatase 68  39 - 117 U/L   AST 43 (*) 0 - 37 U/L   ALT 55 (*) 0 - 35 U/L   Total Protein 7.3  6.0 - 8.3 g/dL   Albumin 3.8  3.5 -  5.2 g/dL   Calcium 9.3  8.4 - 10.5 mg/dL   GFR, Est African American >89     GFR, Est Non African American >89    HEMOGLOBIN A1C      Result Value Ref Range   Hemoglobin A1C 8.1 (*) <5.7 %   Mean Plasma Glucose 186 (*) <117 mg/dL  LIPID PANEL      Result Value Ref Range   Cholesterol 159  0 - 200 mg/dL   Triglycerides 172 (*) <150 mg/dL   HDL 44  >39 mg/dL   Total CHOL/HDL Ratio 3.6     VLDL 34  0 - 40 mg/dL   LDL Cholesterol 81  0 - 99 mg/dL       Assessment & Plan:  8:44 AM- Patient informed of current plan for treatment and evaluation and agrees with plan at this time.  1. DM   2. Pure hypercholesterolemia   3. Type II or unspecified type diabetes mellitus without mention of complication, not stated as uncontrolled   4. Essential hypertension, benign   5. Restless leg syndrome   6. Primary osteoarthritis involving multiple joints   7. Insomnia   8. Fibromyalgia   9. Depression    1. DMII: uncontrolled; will add Glucotrol XL 50m daily.  Switched to Janumet 50/1000 one bid.  Obtain labs. 2. Hypercholesterolemia: controlled; no change in management; obtain labs. 3.  HTN: mild; monitor; may warrant medication in the upcoming few months. 4.  RLS: improved with increase in Requip. 5.  Osteoarthritis multiple sites: worsening; discussed risks and benefits of Meloxicam; recommend taking daily. 6.  Insomnia: improved with increased dose of Requip; continue Ambien. 7.   Fibromyalgia: new diagnosis by rheumatology; follow-up in one month with [facility].   9. Depression:  Worsening; increase Zoloft to 1.5 tablets daily; continue Wellbutrin at current dose. Recommended counseling/therapy but pt declined.  If continues to miss days from work due to depression, will warrant return to psychiatry. Contracted safety.  Agreed to complete FMLA paperwork for DMII, depression; will allow one day of work per month to attend appointments and for depression.  Meds ordered this encounter  Medications  . Coenzyme Q10 (CO Q 10) 100 MG CAPS    Sig: Take 400 mg by mouth 3 (three) times daily.  . Magnesium 500 MG CAPS    Sig: Take 1,000 mg by mouth.  .Marland KitchenBIOTIN 5000 PO    Sig: Take by mouth.  . thiamine (VITAMIN B-1) 100 MG tablet    Sig: Take 100 mg by mouth daily.  . sitaGLIPtin-metformin (JANUMET) 50-1000 MG per tablet    Sig: Take 1 tablet by mouth 2 (two) times daily with a meal.    Dispense:  180 tablet    Refill:  3  . sertraline (ZOLOFT) 100 MG tablet    Sig: Take 1.5 tablets (150 mg total) by mouth daily.    Dispense:  135 tablet    Refill:  3    Return in about 3 months (around 09/29/2014) for complete physical examiniation.   I personally performed the services described in this documentation, which was scribed in my presence.  The recorded information has been reviewed and is accurate.  KReginia Forts M.D.  [facility]   [phone] ([phone]

## 2014-06-29 NOTE — Patient Instructions (Signed)
1. Start taking Meloxicam 48m one tablet daily for joint pain and arthritis pain

## 2014-06-29 NOTE — Addendum Note (Signed)
Addended by: [name] on: 06/29/2014 06:23 PM   Modules accepted: Orders

## 2014-06-29 NOTE — Progress Notes (Signed)
Patient presents today for 3 month diabetes follow-up as part of the employer-sponsored [employer] to Wellness program. Current diabetes regimen includes Metformin and Januvia. Patient also continues on daily ASA and statin. Most recent MD follow-up was today. Patient has a pending appt for 3 mo follow-up in November. At most recent visit, patient had no new meds added, but sertraline was increased from 100 mg daily to 150 mg daily. DM regimen was not changed. BP was elevated at this MD visit, patient would like to wait on adding a HTN medication. I have talked with pt regarding the benefits of being on an ARB or ACEi for both BP and renal protection. No major health changes at this time.     Diabetes Assessment: Type of Diabetes: Type 2; Sees Diabetes provider 4 or more times per year; MD managing Diabetes Loanne Drilling, MD; takes medications as prescribed; checks feet daily; uses glucometer; takes an aspirin a day; A1c in May 8.4 Other Diabetes History: Current med regimen includes Metformin 500 mg two tablets twice daily and most recently Januvia 100 mg daily. Patient would like to switch to Metformin 1000 mg tabs to decrease pill burden. She will request this at next refill. Patient does maintain good medication compliance. Patient did not bring meter today but is currently testing once daily, sometimes twice. Per pateint report fasting is ranging 120-160s, and usually 120-140s. Hypoglycemia frequency is rare. Patient denies signs and symptoms of neuropathy including numbness/tingling/burning and symptoms of foot infection. She continues to report mild and infrequent stabbing pain in her second toe, that has now spread to third toe and other foot as well. MD is aware. I have encouraged pt to gain tighter control of DM in order to halt progression of neuropathy. Up to date on eye exam.  Lifestyle Factors: Diet - No major changes. Patient continues cooking more at home. She is attempting to make better choices,  but is aware of the need to eat more vegetables. She also continues enjoying pasta and breads. She will attmept to limit these more over the next several months.  Breakfast - two egg whites, fruit, etc Lunch - salad, weight watchers frozen dinners Supper - sandwich, grilled cheese, PBJ, sometimes a vegetable  Exercise - Pt resumed exercising and is now on week three. She is riding stationary bike for 20 minutes at least three days per week, but sometimes daily.   Assessment: Patients DM control continues to deteriorate, A1c was not done today, as it was drawn at MD office this morning. Results have not been given yet. She continues to have room for improvement in both diet and exercise, but is making improvements and has recently resumed exercising three days per week. Patient continues to monitor glucose and is now testing once daily. Patient seems motivated at this visit to make changes to diet and exercise and I am hopeful she will be able to maintain these.     Plan: 1) Attempt to limit pasta and bread and continue making healthy dietary choices, including more vegetables 2) Continue exercising at least three times per week 3) Continue testing regularly 4) Continue medication compliance 5) Start monitoring BP at home at least three times per week 6) Follow-up in 3 months on November 25th @ 11:30 am

## 2014-07-05 ENCOUNTER — Encounter: Payer: Self-pay | Admitting: Family Medicine

## 2014-07-05 MED ORDER — GLIPIZIDE ER 5 MG PO TB24: 5.0000 mg | ORAL_TABLET | Freq: Every day | ORAL | Status: DC

## 2014-07-05 NOTE — Addendum Note (Signed)
Addended by: [name] on: 07/05/2014 03:35 PM   Modules accepted: Orders

## 2014-07-11 ENCOUNTER — Ambulatory Visit (INDEPENDENT_AMBULATORY_CARE_PROVIDER_SITE_OTHER): Payer: 59 | Admitting: *Deleted

## 2014-07-11 ENCOUNTER — Ambulatory Visit (INDEPENDENT_AMBULATORY_CARE_PROVIDER_SITE_OTHER): Payer: 59 | Admitting: Physician Assistant

## 2014-07-11 VITALS — BP 124/78 | HR 107 | Temp 98.2°F | Resp 18 | Ht 60.5 in | Wt 241.0 lb

## 2014-07-11 DIAGNOSIS — IMO0001 Reserved for inherently not codable concepts without codable children: Secondary | ICD-10-CM

## 2014-07-11 DIAGNOSIS — L0100 Impetigo, unspecified: Secondary | ICD-10-CM

## 2014-07-11 LAB — CBC WITH DIFFERENTIAL/PLATELET
BASOS PCT: 0.3 % (ref 0.0–3.0)
Basophils Absolute: 0 10*3/uL (ref 0.0–0.1)
EOS ABS: 0.3 10*3/uL (ref 0.0–0.7)
Eosinophils Relative: 2.6 % (ref 0.0–5.0)
HCT: 37.7 % (ref 36.0–46.0)
Hemoglobin: 12.4 g/dL (ref 12.0–15.0)
LYMPHS PCT: 36.2 % (ref 12.0–46.0)
Lymphs Abs: 4.5 10*3/uL — ABNORMAL HIGH (ref 0.7–4.0)
MCHC: 32.8 g/dL (ref 30.0–36.0)
MCV: 83.8 fl (ref 78.0–100.0)
Monocytes Absolute: 0.7 10*3/uL (ref 0.1–1.0)
Monocytes Relative: 5.7 % (ref 3.0–12.0)
NEUTROS ABS: 6.8 10*3/uL (ref 1.4–7.7)
Neutrophils Relative %: 55.2 % (ref 43.0–77.0)
Platelets: 345 10*3/uL (ref 150.0–400.0)
RBC: 4.5 Mil/uL (ref 3.87–5.11)
RDW: 15.6 % — AB (ref 11.5–15.5)
WBC: 12.4 10*3/uL — ABNORMAL HIGH (ref 4.0–10.5)

## 2014-07-11 MED ORDER — FLUCONAZOLE 150 MG PO TABS
150.0000 mg | ORAL_TABLET | Freq: Once | ORAL | Status: DC
Start: 2014-07-11 — End: 2014-07-30

## 2014-07-11 MED ORDER — CEPHALEXIN 500 MG PO CAPS: 500.0000 mg | ORAL_CAPSULE | Freq: Four times a day (QID) | ORAL | Status: AC

## 2014-07-11 MED ORDER — MUPIROCIN 2 % EX OINT: 1.0000 "application " | TOPICAL_OINTMENT | Freq: Three times a day (TID) | CUTANEOUS | Status: DC

## 2014-07-11 NOTE — Progress Notes (Signed)
   Subjective:    Patient ID: Cristina [name], female    DOB: Mar 25, 1961, 53 y.o.   MRN: [id number]   PCP: Reginia Forts, MD  Chief Complaint  Patient presents with  . Rash    under nose noticed friday drainage saturday     Medications, allergies, past medical history, surgical history, family history, social history and problem list reviewed and updated.   HPI  Developed a sore on the edge of the RIGHT nare 3 days ago that became crusted and she reports clear drainage.  Similar to a fever blister she's had previously, but she didn't think that's what it was. She takes acyclovir daily as suppressive therapy. "I was thinking it was impetigo." Grandson has impetigo. No other URI-type symptoms.  Review of Systems As above.    Objective:   Physical Exam  Constitutional: She appears well-developed and well-nourished. No distress.  HENT:  Head: Normocephalic and atraumatic.  Right Ear: Hearing and external ear normal.  Left Ear: Hearing and external ear normal.  Nose: Sinus tenderness present. No mucosal edema or rhinorrhea.    Mouth/Throat: Uvula is midline, oropharynx is clear and moist and mucous membranes are normal.  Neck: Normal range of motion and full passive range of motion without pain. Neck supple.  Lymphadenopathy:       Head (right side): No submental, no submandibular, no tonsillar, no preauricular, no posterior auricular and no occipital adenopathy present.       Head (left side): No submental, no submandibular, no tonsillar, no preauricular, no posterior auricular and no occipital adenopathy present.    She has no cervical adenopathy.       Right: No supraclavicular adenopathy present.       Left: No supraclavicular adenopathy present.  Neurological: She is alert. No cranial nerve deficit.  Psychiatric: She has a normal mood and affect. Her speech is normal and behavior is normal.          Assessment & Plan:  1. Impetigo Try just the topical therapy first,  fill the oral agents if needed. - mupirocin ointment (BACTROBAN) 2 %; Apply 1 application topically 3 (three) times daily.  Dispense: 22 g; Refill: 1 - cephALEXin (KEFLEX) 500 MG capsule; Take 1 capsule (500 mg total) by mouth 4 (four) times daily.  Dispense: 40 capsule; Refill: 0 - fluconazole (DIFLUCAN) 150 MG tablet; Take 1 tablet (150 mg total) by mouth once. Repeat if needed  Dispense: 2 tablet; Refill: 0   Fara Chute, PA-C Physician Assistant-Certified [facility]

## 2014-07-11 NOTE — Patient Instructions (Signed)
IF you take the diflucan, hold the simvastatin (Zocor) while you're on it.

## 2014-07-13 ENCOUNTER — Encounter: Payer: Self-pay | Admitting: Family Medicine

## 2014-07-13 ENCOUNTER — Telehealth: Payer: Self-pay

## 2014-07-13 ENCOUNTER — Other Ambulatory Visit: Payer: Self-pay | Admitting: Family Medicine

## 2014-07-13 NOTE — Telephone Encounter (Signed)
Spoke to pt- she states she has already spoke to disabilities about the paperwork. She understands she will get a phone call when it is completed.

## 2014-07-13 NOTE — Telephone Encounter (Signed)
Pt called in regards to FMLA ppw pleaced in Dr. Thompson Caul box on 8/18, can I please get a status on this? Also pt says she will pay $15 at pick up.

## 2014-07-14 NOTE — Telephone Encounter (Signed)
Dr Tamala Julian, Juluis Rainier. I got a RF req from pharm for Ambien and when checking on it saw this email mes from pt. I called [facility] outpt and spoke to [facility] bc there should have been one RF left on the Rx sent in on 02/11/14. Brayton Layman reported that they do not have a record of this Rx. They have been filling from a Rx written in Feb that had 5 RFs on it. Since you had OK'd #30 w/5RFs on 02/11/14, I went ahead and gave VO for the 1 RF that would have been remaining that was due to be filled today. Pt does not have any other RFs on file besides the #30 I OKd for fill today. I notified pt by MyChart.

## 2014-07-19 ENCOUNTER — Other Ambulatory Visit: Payer: Self-pay | Admitting: Family Medicine

## 2014-07-20 ENCOUNTER — Encounter: Payer: Self-pay | Admitting: Family Medicine

## 2014-07-20 NOTE — Progress Notes (Signed)
Patient ID: Cristina [name], female   DOB: [birth year], 53 y.o.   MRN: [id number] Reviewed:  Agree with the documentation and management of our [facility] pharmacologist.

## 2014-07-20 NOTE — Progress Notes (Signed)
Patient ID: Cristina [name], female   DOB: [birth year], 53 y.o.   MRN: [id number] Reviewed:  Agree with the documentation and management of our [facility] pharmacologist.

## 2014-07-20 NOTE — Progress Notes (Signed)
Patient ID: Cristina [name], female   DOB: [birth year], 53 y.o.   MRN: [id number] Reviewed:  Agree with the documentation and management of our [facility] pharmacologist.

## 2014-07-25 ENCOUNTER — Encounter: Payer: Self-pay | Admitting: Family Medicine

## 2014-07-25 ENCOUNTER — Telehealth: Payer: Self-pay | Admitting: Family Medicine

## 2014-07-25 NOTE — Telephone Encounter (Signed)
Called patient to inform her that FMLA forms have been completed and signed on 07/24/2014 by Dr. Tamala Julian. Patient's VM not set up therefore I could not leave a message. Forms were faxed on 07/25/2014. Will also mail a copy to patients home.

## 2014-07-27 ENCOUNTER — Encounter: Payer: Self-pay | Admitting: Family Medicine

## 2014-07-30 ENCOUNTER — Ambulatory Visit (INDEPENDENT_AMBULATORY_CARE_PROVIDER_SITE_OTHER): Payer: 59 | Admitting: Family Medicine

## 2014-07-30 VITALS — BP 120/84 | HR 96 | Temp 98.3°F | Resp 16 | Ht 60.5 in | Wt 241.0 lb

## 2014-07-30 DIAGNOSIS — S29019A Strain of muscle and tendon of unspecified wall of thorax, initial encounter: Secondary | ICD-10-CM

## 2014-07-30 DIAGNOSIS — E119 Type 2 diabetes mellitus without complications: Secondary | ICD-10-CM

## 2014-07-30 DIAGNOSIS — W57XXXA Bitten or stung by nonvenomous insect and other nonvenomous arthropods, initial encounter: Secondary | ICD-10-CM

## 2014-07-30 DIAGNOSIS — S239XXA Sprain of unspecified parts of thorax, initial encounter: Secondary | ICD-10-CM

## 2014-07-30 DIAGNOSIS — S1096XA Insect bite of unspecified part of neck, initial encounter: Secondary | ICD-10-CM

## 2014-07-30 MED ORDER — HYDROCORTISONE 2.5 % EX OINT: TOPICAL_OINTMENT | Freq: Two times a day (BID) | CUTANEOUS | Status: DC

## 2014-07-30 NOTE — Progress Notes (Signed)
Subjective:  This chart was scribed for Reginia Forts, MD by Thea Alken, ED Scribe. This patient was seen in room 4 and the patient's care was started at 8:59 AM.  Patient ID: Cristina [name], female    DOB: 1961-02-02, 53 y.o.   MRN: [id number]  Back Pain Pertinent negatives include no abdominal pain, fever, numbness or weakness.   Chief Complaint  Patient presents with  . Sore    on back of neck that wont heal x 2 weeks  . Back Pain    right side x 3-4 weeks   HPI Comments: Cristina [name] is a 53 y.o. female who presents to the [facility] complaining of R non radiating, right back pain onset 3-4 weeks ago. Pt believes she may have pulled a muscle but denies known injury. Pt has taken meloxicam daily. Pt has flexeril at home but has not taken this. Pt denies abdominal pain, nausea, emesis and diarrhea. No pain with deep inspiration; no rash along area.  No constant pain but pain is triggered with certain movements.  Has a desk job. No recent change in activity level or heavy lifting.  Pt also c/o an itchy, painful sore to posterior neck onset 2 weeks. Pt reports area is itchy more than painful. Pt has applied neosporin and bactroban on area. Pt is unsure if area is draining. Pt is unsure of insect bites.  Takes oral antihistamine daily.  No fever/chills/sweats.   Past Medical History  Diagnosis Date  . Hemorrhoid   . Fissure, anal   . Fibroids     uterine  . Cystocele   . Rectocele   . Diabetes mellitus   . Genital herpes   . Arthritis   . Depression   . Hyperlipidemia   . Obesity   . GERD (gastroesophageal reflux disease)   . CTS (carpal tunnel syndrome)   . EP (ectopic pregnancy) 1989  . Hx of migraine headaches   . Dysfunction of eustachian tube   . Constipation   . Tobacco use disorder   . Allergic rhinitis, cause unspecified   . Iron deficiency anemia, unspecified   . Insomnia   . OSA on CPAP    Past Surgical History  Procedure Laterality Date    . Cholecystectomy    . Carpal tunnel release  1989    Left  . Ectopic pregnancy surgery  1993  . Tubal ligation  1988  . Cholecystectomy    . Sleep study  09/11/2012    severe OSA.  CPAP titration at 12 cm water pressure.  . Abdominal hysterectomy  11/11/2010    [place] Plan.  Ovaries intact.  Uterine fibroids with DUB.  . Colonoscopy  06/29/2012    normal.  Repeat in 5 years.  . Esophagogastroduodenoscopy  12/13/2011    dysphagia.  Henrene Pastor.  Normal.   Prior to Admission medications   Medication Sig Start Date End Date Taking? Authorizing Provider  acyclovir (ZOVIRAX) 400 MG tablet TAKE 1 TABLET BY MOUTH 2 (TWO) TIMES DAILY. 07/22/13  Yes [name], MD  albuterol (PROAIR HFA) 108 (90 BASE) MCG/ACT inhaler Inhale 2 puffs into the lungs every 6 (six) hours as needed. 02/04/14  Yes [name], MD  ALPRAZolam Duanne Moron) 0.5 MG tablet Take 1 tablet (0.5 mg total) by mouth at bedtime as needed. 06/04/14  Yes [name], MD  AMBULATORY NON FORMULARY MEDICATION Medication Name: Diltizem cream 2% apply as needed to rectum   Yes Historical Provider,  MD  aspirin EC 81 MG tablet Take 81 mg by mouth daily.   Yes Historical Provider, MD  azelastine (ASTELIN) 137 MCG/SPRAY nasal spray Place 2 sprays into both nostrils 2 (two) times daily. Use in each nostril as directed 02/04/14  Yes [name], MD  BIOTIN 5000 PO Take by mouth.   Yes Historical Provider, MD  buPROPion (WELLBUTRIN SR) 100 MG 12 hr tablet Take 1 tablet (100 mg total) by mouth daily. 06/04/14  Yes [name], MD  Calcium Carbonate-Vitamin D (CALCIUM + D PO) Take 1 tablet by mouth daily.   Yes Historical Provider, MD  cetirizine (ZYRTEC) 10 MG tablet Take 1 tablet (10 mg total) by mouth daily. 02/04/14  Yes [name], MD  Coenzyme Q10 (CO Q 10) 100 MG CAPS Take 400 mg by mouth 3 (three) times daily.   Yes Historical Provider, MD  cyclobenzaprine (FLEXERIL) 10 MG tablet Take 1 tablet (10 mg total) by mouth every 8 (eight) hours as  needed for muscle spasms. 06/02/14 06/02/15 Yes Dene Gentry, MD  ferrous sulfate 325 (65 FE) MG EC tablet Take 325 mg by mouth daily.    Yes Historical Provider, MD  fluticasone (FLONASE) 50 MCG/ACT nasal spray Place 2 sprays into both nostrils daily. 02/04/14  Yes [name], MD  glipiZIDE (GLUCOTROL XL) 5 MG 24 hr tablet Take 1 tablet (5 mg total) by mouth daily with breakfast. 07/05/14  Yes [name], MD  Glucosamine HCl 1000 MG TABS Take 1,000 mg by mouth 3 (three) times daily.   Yes Historical Provider, MD  glucose blood test strip USE TO CHECK BLOOD SUGAR DAILY. DX CODE: 250.00 02/04/14  Yes [name], MD  hydrocortisone (ANUSOL-HC) 25 MG suppository Place 1 suppository (25 mg total) rectally 2 (two) times daily as needed for hemorrhoids. 05/24/13  Yes [name], MD  ibuprofen (ADVIL,MOTRIN) 800 MG tablet TAKE 1 TABLET BY MOUTH EVERY 8 HOURS AS NEEDED 01/19/14  Yes [name], MD  Magnesium 500 MG CAPS Take 1,000 mg by mouth.   Yes Historical Provider, MD  meloxicam (MOBIC) 15 MG tablet Take 1 tablet (15 mg total) by mouth daily. With food. 02/04/14  Yes [name], MD  metFORMIN (GLUCOPHAGE) 500 MG tablet Take 2 tablets (1,000 mg total) by mouth 2 (two) times daily with a meal. 02/04/14  Yes [name], MD  montelukast (SINGULAIR) 10 MG tablet TAKE 1 TABLET (10 MG TOTAL) BY MOUTH AT BEDTIME. 07/21/14  Yes [name], MD  Multiple Vitamin (MULTIVITAMIN) capsule Take 1 capsule by mouth daily.   Yes Historical Provider, MD  mupirocin ointment (BACTROBAN) 2 % Apply 1 application topically 3 (three) times daily. 07/11/14  Yes Chelle S Jeffery, PA-C  pantoprazole (PROTONIX) 40 MG tablet Take 1 tablet (40 mg total) by mouth 2 (two) times daily. 02/24/14  Yes [name], MD  rOPINIRole (REQUIP) 1 MG tablet Take 1 tablet (1 mg total) by mouth at bedtime. 05/10/14  Yes [name], MD  senna (SENOKOT) 8.6 MG tablet Take 1 tablet by mouth daily.   Yes Historical Provider,  MD  sertraline (ZOLOFT) 100 MG tablet Take 1.5 tablets (150 mg total) by mouth daily. 06/29/14  Yes [name], MD  simvastatin (ZOCOR) 40 MG tablet Take 1 tablet (40 mg total) by mouth daily at 6 PM. 02/04/14  Yes [name], MD  sitaGLIPtin (JANUVIA) 100 MG tablet Take 1 tablet (100  mg total) by mouth daily. 03/31/14  Yes [name], MD  thiamine (VITAMIN B-1) 100 MG tablet Take 100 mg by mouth daily.   Yes Historical Provider, MD  TRUEPLUS LANCETS 33G MISC USE TO CHECK BLOOD GLUCOSE DAILY AS DIRECTED   Yes [name], MD  zolpidem (AMBIEN) 5 MG tablet Take 1 tablet (5 mg total) by mouth at bedtime as needed for sleep. 02/11/14  Yes [name], MD  sitaGLIPtin-metformin (JANUMET) 50-1000 MG per tablet Take 1 tablet by mouth 2 (two) times daily with a meal. 06/29/14   [name], MD   Review of Systems  Constitutional: Negative for fever, chills, diaphoresis and fatigue.  Gastrointestinal: Negative for nausea, vomiting, abdominal pain and diarrhea.  Musculoskeletal: Positive for back pain. Negative for neck pain and neck stiffness.  Skin: Positive for color change, rash and wound.       Sore to posterior neck  Neurological: Negative for weakness and numbness.   Objective:   Physical Exam  Nursing note and vitals reviewed. Constitutional: She is oriented to person, place, and time. She appears well-developed and well-nourished. No distress.  HENT:  Head: Normocephalic and atraumatic.  Eyes: Conjunctivae and EOM are normal.  Neck: Neck supple.  Cardiovascular: Normal rate, regular rhythm, normal heart sounds and intact distal pulses.  Exam reveals no gallop and no friction rub.   No murmur heard. Pulmonary/Chest: Effort normal and breath sounds normal. She has no wheezes. She has no rales.  Musculoskeletal: Normal range of motion.       Cervical back: She exhibits pain. She exhibits normal range of motion, no tenderness and no bony tenderness.       Thoracic back: She  exhibits pain. She exhibits normal range of motion, no tenderness, no bony tenderness and no spasm.       Lumbar back: She exhibits pain. She exhibits normal range of motion, no tenderness, no bony tenderness and no spasm.  Lateral ribs- tender lateral ribs.  Non tender lumbar and thoracic. Full ROM of thoracic. Pain with rotation.   Neurological: She is alert and oriented to person, place, and time.  Skin: Skin is warm and dry. She is not diaphoretic. No erythema.     Posterior-18mm diameter open wound with minimal surrounding erythema. no drainage and induration. Mildly tender  Psychiatric: She has a normal mood and affect. Her behavior is normal.   Filed Vitals:   07/30/14 0837  BP: 120/84  Pulse: 96  Temp: 98.3 F (36.8 C)  TempSrc: Oral  Resp: 16  Height: 5' 0.5" (1.537 m)  Weight: 241 lb (109.317 kg)  SpO2: 96%   Assessment & Plan:   1. Insect bite of neck with local reaction, initial encounter   2. Thoracic myofascial strain, initial encounter   3. Type II or unspecified type diabetes mellitus without mention of complication, not stated as uncontrolled     1. Insect bite with local allergic reaction posterior neck: New . Rx for hydrocortisone cream provided to apply bid; local wound care reviewed; continue oral antihistamine daily.  RTC for fever, increasing pain, drainage, swelling. 2.  Thoracic back pain/strain:  New.  Continue Meloxicam daily; add Flexeril which pt has at home q evening; recommend regular stretching and avoiding heavy lifting for next two weeks; apply heat to area bid for 15-20 minutes each time.   3.  DMII: moderately controlled with recent worsening; continue current regimen; high risk for infections.   Meds ordered this encounter  Medications  . hydrocortisone 2.5 % ointment    Sig: Apply topically 2 (two) times daily.    Dispense:  30 g    Refill:  0    I personally performed the services described in this documentation, which was scribed in  my presence.  The recorded information has been reviewed and is accurate.  Reginia Forts, M.D.  [facility] [address] [place], [place]  [address] [phone] phone [phone] fax

## 2014-07-30 NOTE — Patient Instructions (Signed)
1.  Call if your insect bite starts hurting more than itching. 2. Apply heat to side twice daily for 15-20 minutes each day. 3.  Stretch back regularly.  Avoid heavy lifting for the next two weeks. 4.  Take Flexeril at bedtime or in the evening daily for the next week.  Insect Bite Mosquitoes, flies, fleas, bedbugs, and many other insects can bite. Insect bites are different from insect stings. A sting is when venom is injected into the skin. Some insect bites can transmit infectious diseases. SYMPTOMS  Insect bites usually turn red, swell, and itch for 2 to 4 days. They often go away on their own. TREATMENT  Your caregiver may prescribe antibiotic medicines if a bacterial infection develops in the bite. HOME CARE INSTRUCTIONS  Do not scratch the bite area.  Keep the bite area clean and dry. Wash the bite area thoroughly with soap and water.  Put ice or cool compresses on the bite area.  Put ice in a plastic bag.  Place a towel between your skin and the bag.  Leave the ice on for 20 minutes, 4 times a day for the first 2 to 3 days, or as directed.  You may apply a baking soda paste, cortisone cream, or calamine lotion to the bite area as directed by your caregiver. This can help reduce itching and swelling.  Only take over-the-counter or prescription medicines as directed by your caregiver.  If you are given antibiotics, take them as directed. Finish them even if you start to feel better. You may need a tetanus shot if:  You cannot remember when you had your last tetanus shot.  You have never had a tetanus shot.  The injury broke your skin. If you get a tetanus shot, your arm may swell, get red, and feel warm to the touch. This is common and not a problem. If you need a tetanus shot and you choose not to have one, there is a rare chance of getting tetanus. Sickness from tetanus can be serious. SEEK IMMEDIATE MEDICAL CARE IF:   You have increased pain, redness, or swelling in  the bite area.  You see a red line on the skin coming from the bite.  You have a fever.  You have joint pain.  You have a headache or neck pain.  You have unusual weakness.  You have a rash.  You have chest pain or shortness of breath.  You have abdominal pain, nausea, or vomiting.  You feel unusually tired or sleepy. MAKE SURE YOU:   Understand these instructions.  Will watch your condition.  Will get help right away if you are not doing well or get worse. Document Released: 12/05/2004 Document Revised: 01/20/2012 Document Reviewed: 05/29/2011 [facility] Patient Information 2015 [place], [place]. This information is not intended to replace advice given to you by your health care provider. Make sure you discuss any questions you have with your health care provider.

## 2014-08-19 ENCOUNTER — Encounter: Payer: Self-pay | Admitting: Family Medicine

## 2014-08-19 ENCOUNTER — Other Ambulatory Visit: Payer: Self-pay | Admitting: Family Medicine

## 2014-08-22 ENCOUNTER — Other Ambulatory Visit: Payer: Self-pay | Admitting: Family Medicine

## 2014-08-22 NOTE — Telephone Encounter (Signed)
ambien 5 mg and xanax 5 mg  And   sitaGLIPtin (JANUVIA) 100 MG tablet  [phone]

## 2014-08-22 NOTE — Telephone Encounter (Signed)
Please call in refills to pharmacy as approved for Ambien, Xanax.

## 2014-08-23 NOTE — Telephone Encounter (Signed)
Called scripts into pharmacy.

## 2014-08-24 ENCOUNTER — Other Ambulatory Visit: Payer: Self-pay | Admitting: Radiology

## 2014-08-25 MED ORDER — ALPRAZOLAM 0.5 MG PO TABS: 0.5000 mg | ORAL_TABLET | Freq: Every day | ORAL | Status: DC | PRN

## 2014-08-25 MED ORDER — ZOLPIDEM TARTRATE 5 MG PO TABS: 5.0000 mg | ORAL_TABLET | Freq: Every day | ORAL | Status: DC

## 2014-08-25 NOTE — Telephone Encounter (Signed)
Faxed

## 2014-08-25 NOTE — Telephone Encounter (Signed)
Called in / my chart message has been sent to patient

## 2014-08-26 ENCOUNTER — Other Ambulatory Visit: Payer: Self-pay

## 2014-09-08 ENCOUNTER — Encounter: Payer: Self-pay | Admitting: Family Medicine

## 2014-09-08 NOTE — Progress Notes (Signed)
Patient ID: Cristina [name], female   DOB: 1961/07/09, 53 y.o.   MRN: [id number] Reviewed: Agree with the documentation and management of our [place].

## 2014-09-22 ENCOUNTER — Other Ambulatory Visit: Payer: Self-pay | Admitting: Family Medicine

## 2014-09-27 ENCOUNTER — Other Ambulatory Visit: Payer: Self-pay | Admitting: Family Medicine

## 2014-09-27 NOTE — Telephone Encounter (Signed)
Dr Tamala Julian, you saw pt in aug, but I don't see this med discussed. Do you want to give RFs?

## 2014-10-04 ENCOUNTER — Ambulatory Visit (INDEPENDENT_AMBULATORY_CARE_PROVIDER_SITE_OTHER): Payer: Self-pay | Admitting: Family Medicine

## 2014-10-04 VITALS — BP 132/88 | Wt 238.0 lb

## 2014-10-04 DIAGNOSIS — E119 Type 2 diabetes mellitus without complications: Secondary | ICD-10-CM

## 2014-10-05 ENCOUNTER — Encounter: Payer: Self-pay | Admitting: Family Medicine

## 2014-10-05 ENCOUNTER — Ambulatory Visit (INDEPENDENT_AMBULATORY_CARE_PROVIDER_SITE_OTHER): Payer: 59 | Admitting: Family Medicine

## 2014-10-05 VITALS — BP 153/92 | HR 98 | Temp 98.6°F | Resp 16 | Ht 61.0 in | Wt 237.6 lb

## 2014-10-05 DIAGNOSIS — M797 Fibromyalgia: Secondary | ICD-10-CM

## 2014-10-05 DIAGNOSIS — E1110 Type 2 diabetes mellitus with ketoacidosis without coma: Secondary | ICD-10-CM

## 2014-10-05 DIAGNOSIS — Z9989 Dependence on other enabling machines and devices: Secondary | ICD-10-CM

## 2014-10-05 DIAGNOSIS — E1310 Other specified diabetes mellitus with ketoacidosis without coma: Secondary | ICD-10-CM

## 2014-10-05 DIAGNOSIS — E669 Obesity, unspecified: Secondary | ICD-10-CM

## 2014-10-05 DIAGNOSIS — E785 Hyperlipidemia, unspecified: Secondary | ICD-10-CM

## 2014-10-05 DIAGNOSIS — Z0000 Encounter for general adult medical examination without abnormal findings: Secondary | ICD-10-CM

## 2014-10-05 DIAGNOSIS — N898 Other specified noninflammatory disorders of vagina: Secondary | ICD-10-CM

## 2014-10-05 DIAGNOSIS — G4733 Obstructive sleep apnea (adult) (pediatric): Secondary | ICD-10-CM

## 2014-10-05 DIAGNOSIS — Z01419 Encounter for gynecological examination (general) (routine) without abnormal findings: Secondary | ICD-10-CM

## 2014-10-05 DIAGNOSIS — I10 Essential (primary) hypertension: Secondary | ICD-10-CM

## 2014-10-05 LAB — CBC WITH DIFFERENTIAL/PLATELET
Basophils Absolute: 0 K/uL (ref 0.0–0.1)
Basophils Relative: 0 % (ref 0–1)
Eosinophils Absolute: 0.3 K/uL (ref 0.0–0.7)
Eosinophils Relative: 3 % (ref 0–5)
HCT: 40.2 % (ref 36.0–46.0)
Hemoglobin: 13.1 g/dL (ref 12.0–15.0)
Lymphocytes Relative: 33 % (ref 12–46)
Lymphs Abs: 3.8 K/uL (ref 0.7–4.0)
MCH: 26.8 pg (ref 26.0–34.0)
MCHC: 32.6 g/dL (ref 30.0–36.0)
MCV: 82.4 fL (ref 78.0–100.0)
MPV: 10.4 fL (ref 9.4–12.4)
Monocytes Absolute: 0.7 K/uL (ref 0.1–1.0)
Monocytes Relative: 6 % (ref 3–12)
Neutro Abs: 6.6 K/uL (ref 1.7–7.7)
Neutrophils Relative %: 58 % (ref 43–77)
Platelets: 362 K/uL (ref 150–400)
RBC: 4.88 MIL/uL (ref 3.87–5.11)
RDW: 15.2 % (ref 11.5–15.5)
WBC: 11.4 K/uL — ABNORMAL HIGH (ref 4.0–10.5)

## 2014-10-05 LAB — POCT WET PREP WITH KOH
KOH PREP POC: NEGATIVE
RBC WET PREP PER HPF POC: NEGATIVE
TRICHOMONAS UA: NEGATIVE
WBC WET PREP PER HPF POC: NEGATIVE
YEAST WET PREP PER HPF POC: NEGATIVE

## 2014-10-05 LAB — COMPLETE METABOLIC PANEL WITHOUT GFR
ALT: 88 U/L — ABNORMAL HIGH (ref 0–35)
AST: 74 U/L — ABNORMAL HIGH (ref 0–37)
Albumin: 3.9 g/dL (ref 3.5–5.2)
Alkaline Phosphatase: 78 U/L (ref 39–117)
BUN: 14 mg/dL (ref 6–23)
CO2: 24 meq/L (ref 19–32)
Calcium: 9.3 mg/dL (ref 8.4–10.5)
Chloride: 106 meq/L (ref 96–112)
Creat: 0.71 mg/dL (ref 0.50–1.10)
GFR, Est African American: >89 mL/min
GFR, Est Non African American: >89 mL/min
Glucose, Bld: 162 mg/dL — ABNORMAL HIGH (ref 70–99)
Potassium: 4.5 meq/L (ref 3.5–5.3)
Sodium: 141 meq/L (ref 135–145)
Total Bilirubin: 0.4 mg/dL (ref 0.2–1.2)
Total Protein: 7.6 g/dL (ref 6.0–8.3)

## 2014-10-05 LAB — LIPID PANEL
CHOL/HDL RATIO: 4 ratio
CHOLESTEROL: 165 mg/dL (ref 0–200)
HDL: 41 mg/dL (ref >39)
LDL CALC: 93 mg/dL (ref 0–99)
Triglycerides: 153 mg/dL — ABNORMAL HIGH (ref <150)
VLDL: 31 mg/dL (ref 0–40)

## 2014-10-05 LAB — MICROALBUMIN, URINE: Microalb, Ur: 2.7 mg/dL — ABNORMAL HIGH

## 2014-10-05 LAB — TSH: TSH: 2.203 u[IU]/mL (ref 0.350–4.500)

## 2014-10-05 MED ORDER — SITAGLIPTIN PHOSPHATE 100 MG PO TABS: ORAL_TABLET | ORAL | Status: DC

## 2014-10-05 MED ORDER — GLIPIZIDE ER 10 MG PO TB24: 10.0000 mg | ORAL_TABLET | Freq: Every day | ORAL | Status: DC

## 2014-10-05 MED ORDER — ALPRAZOLAM 0.5 MG PO TABS: 0.5000 mg | ORAL_TABLET | Freq: Every day | ORAL | Status: DC | PRN

## 2014-10-05 NOTE — Progress Notes (Signed)
Subjective:    Patient ID: Cristina [name], female    DOB: 05-Feb-1961, 53 y.o.   MRN: [id number]  10/05/2014  Annual Exam; Vaginal Itching; and Abdominal Pain   HPI This 53 y.o. female presents for Complete Physical Examination.  Last physical:  2014 Pap smear:  2013 Mammogram:  11/2013 Colonoscopy: 06/29/2012 TDAP:  2011 Pneumovax:  2009 Zostavax: never Influenza: 08/25/2014 Eye exam:  11/2013; [facility] exam:  Every six months.   Elevated blood pressure: readings running 118-160/66-88.  One elevated reading.    Taking Xanax three times per week on average. Taking Flexeril qhs scheduled.   Taking iron q d.   Taking protonix bid. Only taking Zoloft 1 daily.  Ambien qhs scheduled.  Uses CPAP nightly; no longer has headaches upon awakening.  +Joint stiffness, hemorrhoid issues, sinus problems, vaginal discharge.   Diagnosed with fibromyalgia by Dr. Kathee Delton. Started on various vitamins; scheduled for follow-up but had to cancel appointment; has not rescheduled.    Review of Systems  Constitutional: Positive for diaphoresis and fatigue. Negative for fever, chills, activity change, appetite change and unexpected weight change.  HENT: Positive for congestion, postnasal drip and rhinorrhea. Negative for dental problem, drooling, ear discharge, ear pain, facial swelling, hearing loss, mouth sores, nosebleeds, sinus pressure, sneezing, sore throat, tinnitus, trouble swallowing and voice change.   Eyes: Negative for photophobia, pain, discharge, redness, itching and visual disturbance.  Respiratory: Negative for apnea, cough, choking, chest tightness, shortness of breath, wheezing and stridor.   Cardiovascular: Negative for chest pain, palpitations and leg swelling.  Gastrointestinal: Positive for diarrhea, constipation, blood in stool and anal bleeding. Negative for nausea, vomiting, abdominal pain, abdominal distention and rectal pain.  Endocrine: Positive for polydipsia. Negative  for cold intolerance, heat intolerance, polyphagia and polyuria.  Genitourinary: Negative for dysuria, urgency, frequency, hematuria, flank pain, decreased urine volume, vaginal bleeding, vaginal discharge, enuresis, difficulty urinating, genital sores, vaginal pain, menstrual problem, pelvic pain and dyspareunia.  Musculoskeletal: Positive for myalgias, back pain and arthralgias. Negative for joint swelling, gait problem, neck pain and neck stiffness.  Skin: Negative for color change, pallor, rash and wound.  Allergic/Immunologic: Positive for environmental allergies. Negative for food allergies and immunocompromised state.  Neurological: Positive for numbness. Negative for dizziness, tremors, seizures, syncope, facial asymmetry, speech difficulty, weakness, light-headedness and headaches.  Hematological: Negative for adenopathy. Does not bruise/bleed easily.  Psychiatric/Behavioral: Positive for dysphoric mood. Negative for suicidal ideas, hallucinations, behavioral problems, confusion, sleep disturbance, self-injury, decreased concentration and agitation. The patient is not nervous/anxious and is not hyperactive.     Past Medical History  Diagnosis Date  . Hemorrhoid   . Fissure, anal   . Fibroids     uterine  . Cystocele   . Rectocele   . Diabetes mellitus   . Genital herpes   . Arthritis   . Depression   . Hyperlipidemia   . Obesity   . GERD (gastroesophageal reflux disease)   . CTS (carpal tunnel syndrome)   . EP (ectopic pregnancy) 1989  . Hx of migraine headaches   . Dysfunction of eustachian tube   . Constipation   . Tobacco use disorder   . Allergic rhinitis, cause unspecified   . Iron deficiency anemia, unspecified   . Insomnia   . OSA on CPAP    Past Surgical History  Procedure Laterality Date  . Cholecystectomy    . Carpal tunnel release  1989    Left  . Ectopic pregnancy surgery  1993  .  Tubal ligation  1988  . Cholecystectomy    . Sleep study  09/11/2012     severe OSA.  CPAP titration at 12 cm water pressure.  . Abdominal hysterectomy  11/11/2010    [facility].  Ovaries intact.  Uterine fibroids with DUB.  . Colonoscopy  06/29/2012    normal.  Repeat in 5 years.  . Esophagogastroduodenoscopy  12/13/2011    dysphagia.  Henrene Pastor.  Normal.   Allergies  Allergen Reactions  . Fish Oil Rash   Current Outpatient Prescriptions  Medication Sig Dispense Refill  . acyclovir (ZOVIRAX) 400 MG tablet TAKE 1 TABLET BY MOUTH 2 (TWO) TIMES DAILY. 60 tablet 9  . albuterol (PROAIR HFA) 108 (90 BASE) MCG/ACT inhaler Inhale 2 puffs into the lungs every 6 (six) hours as needed. 18 g 1  . ALPRAZolam (XANAX) 0.5 MG tablet Take 1 tablet (0.5 mg total) by mouth daily as needed for anxiety. 30 tablet 5  . AMBULATORY NON FORMULARY MEDICATION Medication Name: Diltizem cream 2% apply as needed to rectum    . ANUCORT-HC 25 MG suppository PLACE 1 SUPPOSITORY RECTALLY 2 (TWO) TIMES DAILY AS NEEDED FOR HEMORRHOIDS. 25 suppository 5  . aspirin EC 81 MG tablet Take 81 mg by mouth daily.    [name] azelastine (ASTELIN) 137 MCG/SPRAY nasal spray Place 2 sprays into both nostrils 2 (two) times daily. Use in each nostril as directed 30 mL 11  . BIOTIN 5000 PO Take by mouth.    [name] buPROPion (WELLBUTRIN SR) 100 MG 12 hr tablet Take 1 tablet (100 mg total) by mouth daily. 90 tablet 3  . Calcium Carbonate-Vitamin D (CALCIUM + D PO) Take 1 tablet by mouth daily.    . cetirizine (ZYRTEC) 10 MG tablet Take 1 tablet (10 mg total) by mouth daily. 90 tablet 3  . Coenzyme Q10 (CO Q 10) 100 MG CAPS Take 400 mg by mouth 3 (three) times daily.    . cyclobenzaprine (FLEXERIL) 10 MG tablet Take 1 tablet (10 mg total) by mouth every 8 (eight) hours as needed for muscle spasms. 60 tablet 0  . ferrous sulfate 325 (65 FE) MG EC tablet Take 325 mg by mouth daily.     . fluticasone (FLONASE) 50 MCG/ACT nasal spray Place 2 sprays into both nostrils daily. 16 g 11  . glipiZIDE (GLUCOTROL XL) 10 MG 24 hr tablet Take 1  tablet (10 mg total) by mouth daily with breakfast. 90 tablet 3  . Glucosamine HCl 1000 MG TABS Take 1,000 mg by mouth 3 (three) times daily.    [name] glucose blood test strip USE TO CHECK BLOOD SUGAR DAILY. DX CODE: 250.00 100 each 3  . hydrocortisone 2.5 % ointment Apply topically 2 (two) times daily. 30 g 0  . ibuprofen (ADVIL,MOTRIN) 800 MG tablet TAKE 1 TABLET BY MOUTH EVERY 8 HOURS AS NEEDED 40 tablet 4  . Magnesium 500 MG CAPS Take 1,000 mg by mouth.    . meloxicam (MOBIC) 15 MG tablet Take 1 tablet (15 mg total) by mouth daily. With food. 30 tablet 3  . metFORMIN (GLUCOPHAGE) 500 MG tablet Take 2 tablets (1,000 mg total) by mouth 2 (two) times daily with a meal. 360 tablet 3  . montelukast (SINGULAIR) 10 MG tablet TAKE 1 TABLET (10 MG TOTAL) BY MOUTH AT BEDTIME. 90 tablet 2  . Multiple Vitamin (MULTIVITAMIN) capsule Take 1 capsule by mouth daily.    . mupirocin ointment (BACTROBAN) 2 % Apply 1 application topically 3 (three) times daily.  22 g 1  . pantoprazole (PROTONIX) 40 MG tablet Take 1 tablet (40 mg total) by mouth 2 (two) times daily. 180 tablet 3  . rOPINIRole (REQUIP) 1 MG tablet Take 1 tablet (1 mg total) by mouth at bedtime. 90 tablet 3  . senna (SENOKOT) 8.6 MG tablet Take 1 tablet by mouth daily.    . sertraline (ZOLOFT) 100 MG tablet Take 1.5 tablets (150 mg total) by mouth daily. 135 tablet 3  . simvastatin (ZOCOR) 40 MG tablet Take 1 tablet (40 mg total) by mouth daily at 6 PM. 90 tablet 3  . sitaGLIPtin (JANUVIA) 100 MG tablet TAKE 1 TABLET BY MOUTH ONCE DAILY  NEEDS F/U FOR REFILLS 90 tablet 3  . thiamine (VITAMIN B-1) 100 MG tablet Take 100 mg by mouth daily.    . TRUEPLUS LANCETS 33G MISC USE TO CHECK BLOOD GLUCOSE DAILY AS DIRECTED 100 each 3  . zolpidem (AMBIEN) 5 MG tablet Take 1 tablet (5 mg total) by mouth at bedtime. 30 tablet 5  . sitaGLIPtin-metformin (JANUMET) 50-1000 MG per tablet Take 1 tablet by mouth 2 (two) times daily with a meal. (Patient not taking:  Reported on 10/05/2014) 180 tablet 3   No current facility-administered medications for this visit.       Objective:    BP 153/92 mmHg  Pulse 98  Temp(Src) 98.6 F (37 C) (Oral)  Resp 16  Ht 5\' 1"  (1.549 m)  Wt 237 lb 9.6 oz (107.775 kg)  BMI 44.92 kg/m2  SpO2 97% Physical Exam  Constitutional: She is oriented to person, place, and time. She appears well-developed and well-nourished. No distress.  Obese.  HENT:  Head: Normocephalic and atraumatic.  Right Ear: External ear normal.  Left Ear: External ear normal.  Nose: Nose normal.  Mouth/Throat: Oropharynx is clear and moist.  Eyes: Conjunctivae and EOM are normal. Pupils are equal, round, and reactive to light.  Neck: Normal range of motion and full passive range of motion without pain. Neck supple. No JVD present. Carotid bruit is not present. No thyromegaly present.  Cardiovascular: Normal rate, regular rhythm and normal heart sounds.  Exam reveals no gallop and no friction rub.   No murmur heard. Pulmonary/Chest: Effort normal and breath sounds normal. She has no wheezes. She has no rales. Right breast exhibits no inverted nipple, no mass, no nipple discharge, no skin change and no tenderness. Left breast exhibits no inverted nipple, no mass, no nipple discharge, no skin change and no tenderness. Breasts are symmetrical.  Abdominal: Soft. Bowel sounds are normal. She exhibits no distension and no mass. There is no tenderness. There is no rebound and no guarding.  Genitourinary: Rectal exam shows external hemorrhoid. There is no rash, tenderness, lesion or injury on the right labia. There is no rash, tenderness, lesion or injury on the left labia. Right adnexum displays no mass, no tenderness and no fullness. Left adnexum displays no mass, no tenderness and no fullness. There is erythema in the vagina. No bleeding in the vagina. No foreign body around the vagina. No signs of injury around the vagina. Vaginal discharge found.    Musculoskeletal:       Right shoulder: Normal.       Left shoulder: Normal.       Cervical back: Normal.  Lymphadenopathy:    She has no cervical adenopathy.  Neurological: She is alert and oriented to person, place, and time. She has normal reflexes. No cranial nerve deficit. She exhibits normal muscle tone.  Coordination normal.  Skin: Skin is warm and dry. No rash noted. She is not diaphoretic. No erythema. No pallor.  Psychiatric: She has a normal mood and affect. Her behavior is normal. Judgment and thought content normal.  Nursing note and vitals reviewed.       Assessment & Plan:   1. Routine general medical examination at a health care facility   2. DM (diabetes mellitus) type 2, uncontrolled, with ketoacidosis   3. Essential hypertension   4. Hyperlipidemia   5. Vaginal discharge   6. Encounter for routine gynecological examination      1. Complete Physical Examination: anticipatory guidance --- exercise, weight loss, 3 servings of dairy daily.  Pap smear obtained; mammogram UTD.  Colonoscopy UTD.  Immunizations UTD. 2.  Gynecological exam: completed; pap smear obtained; mammogram UTD. S/p hysterectomy secondary DUB. 3.  HTN: New.  Continue to monitor closely; will likely warrant addition of medication in upcoming months. 4.  Hyperlipidemia: controlled; obtain labs; refills  Provided. 5.  Vaginal discharge: New. Obtain wet prep. 6.  Allergic Rhinitis; uncontrolled; improving slowly; recommend adding nasal saline spray PRN. 7. OSA: controlled with CPAP.  Recommend weight loss. 8. Hemorrhoids external irritated: Recurrent; started using Anusol suppositories yesterday.  9. Obesity: recommend weight loss, exercise. 10. DMII: uncontrolled; increase Glipizide to 10mg  daily; obtain labs. 11. Anxiety and depression: moderately controlled; refill of Xanax provided.    Meds ordered this encounter  Medications  . glipiZIDE (GLUCOTROL XL) 10 MG 24 hr tablet    Sig: Take 1  tablet (10 mg total) by mouth daily with breakfast.    Dispense:  90 tablet    Refill:  3  . sitaGLIPtin (JANUVIA) 100 MG tablet    Sig: TAKE 1 TABLET BY MOUTH ONCE DAILY  NEEDS F/U FOR REFILLS    Dispense:  90 tablet    Refill:  3  . ALPRAZolam (XANAX) 0.5 MG tablet    Sig: Take 1 tablet (0.5 mg total) by mouth daily as needed for anxiety.    Dispense:  30 tablet    Refill:  5    Return in about 3 months (around 01/05/2015) for recheck diabetes, high cholesterol.    Reginia Forts, M.D.  [facility] [address] [place], [place]  [address] [phone] phone [phone] fax

## 2014-10-05 NOTE — Patient Instructions (Signed)
Keeping You Healthy  Get These Tests  Blood Pressure- Have your blood pressure checked by your healthcare provider at least once a year.  Normal blood pressure is 120/80.  Weight- Have your body mass index (BMI) calculated to screen for obesity.  BMI is a measure of body fat based on height and weight.  You can calculate your own BMI at [employer]  Cholesterol- Have your cholesterol checked every year.  Diabetes- Have your blood sugar checked every year if you have high blood pressure, high cholesterol, a family history of diabetes or if you are overweight.  Pap Smear- Have a pap smear every 1 to 3 years if you have been sexually active.  If you are older than 65 and recent pap smears have been normal you may not need additional pap smears.  In addition, if you have had a hysterectomy  For benign disease additional pap smears are not necessary.  Mammogram-Yearly mammograms are essential for early detection of breast cancer  Screening for Colon Cancer- Colonoscopy starting at age 18. Screening may begin sooner depending on your family history and other health conditions.  Follow up colonoscopy as directed by your Gastroenterologist.  Screening for Osteoporosis- Screening begins at age 40 with bone density scanning, sooner if you are at higher risk for developing Osteoporosis.  Get these medicines  Calcium with Vitamin D- Your body requires 1200-1500 mg of Calcium a day and [phone] IU of Vitamin D a day.  You can only absorb 500 mg of Calcium at a time therefore Calcium must be taken in 2 or 3 separate doses throughout the day.  Hormones- Hormone therapy has been associated with increased risk for certain cancers and heart disease.  Talk to your healthcare provider about if you need relief from menopausal symptoms.  Aspirin- Ask your healthcare provider about taking Aspirin to prevent Heart Disease and Stroke.  Get these Immuniztions  Flu shot- Every fall  Pneumonia  shot- Once after the age of 35; if you are younger ask your healthcare provider if you need a pneumonia shot.  Tetanus- Every ten years.  Zostavax- Once after the age of 20 to prevent shingles.  Take these steps  Don't smoke- Your healthcare provider can help you quit. For tips on how to quit, ask your healthcare provider or go to www.smokefree.gov or call 1-800 QUIT-NOW.  Be physically active- Exercise 5 days a week for a minimum of 30 minutes.  If you are not already physically active, start slow and gradually work up to 30 minutes of moderate physical activity.  Try walking, dancing, bike riding, swimming, etc.  Eat a healthy diet- Eat a variety of healthy foods such as fruits, vegetables, whole grains, low fat milk, low fat cheeses, yogurt, lean meats, chicken, fish, eggs, dried beans, tofu, etc.  For more information go to www.thenutritionsource.org  Dental visit- Brush and floss teeth twice daily; visit your dentist twice a year.  Eye exam- Visit your Optometrist or Ophthalmologist yearly.  Drink alcohol in moderation- Limit alcohol intake to one drink or less a day.  Never drink and drive.  Depression- Your emotional health is as important as your physical health.  If you're feeling down or losing interest in things you normally enjoy, please talk to your healthcare provider.  Seat Belts- can save your life; always wear one  Smoke/Carbon Monoxide detectors- These detectors need to be installed on the appropriate level of your home.  Replace batteries at least once a year.  Violence- If anyone  is threatening or hurting you, please tell your healthcare provider.  Living Will/ Health care power of attorney- Discuss with your healthcare provider and family.

## 2014-10-06 LAB — HEMOGLOBIN A1C
Hgb A1c MFr Bld: 8.7 % — ABNORMAL HIGH (ref <5.7)
Mean Plasma Glucose: 203 mg/dL — ABNORMAL HIGH (ref <117)

## 2014-10-07 LAB — PAP IG (IMAGE GUIDED)

## 2014-10-11 ENCOUNTER — Telehealth: Payer: Self-pay

## 2014-10-11 MED ORDER — METRONIDAZOLE 500 MG PO TABS: 500.0000 mg | ORAL_TABLET | Freq: Two times a day (BID) | ORAL | Status: DC

## 2014-10-11 MED ORDER — LISINOPRIL 5 MG PO TABS: 5.0000 mg | ORAL_TABLET | Freq: Every day | ORAL | Status: DC

## 2014-10-11 NOTE — Addendum Note (Signed)
Addended by: [name] on: 10/11/2014 02:08 PM   Modules accepted: Orders

## 2014-10-11 NOTE — Telephone Encounter (Signed)
Patient would like to talk with someone regarding her medication that was prescribed   [phone] (H)

## 2014-10-11 NOTE — Progress Notes (Signed)
Patient presents today for 3 month diabetes follow-up as part of the employer-sponsored [employer] program. Current diabetes regimen includes Metformin, glipizide, and Januvia. Patient also continues on daily ASA and statin. Most recent MD follow-up was today. Patient has a pending appt for 3 mo follow-up tomorrow. No major med changes or major health changes at this time.  Diabetes Assessment: Type of Diabetes: Type 2; Sees Diabetes provider 4 or more times per year; MD managing Diabetes Loanne Drilling, MD; takes medications as prescribed; checks feet daily; uses glucometer; takes an aspirin a day; Lowest CBG 140; Highest CBG 187; hypoglycemia frequency none Other Diabetes History: Current med regimen includes Metformin 500 mg two tablets twice daily, glipizide er 5 mg daily, and Januvia 100 mg daily. Patient does maintain good medication compliance. Patient did not bring meter today but is currently testing once daily, 3-4 days per week. Per pateint report fasting is ranging 140-180s and has been elevated recently. Hypoglycemia frequency is rare. Patient denies signs and symptoms of neuropathy including numbness/tingling/burning and symptoms of foot infection. She continues to report mild and infrequent stabbing pain in her second toe, that has now spread to third toe and other foot as well. MD is aware. I have encouraged pt to gain tighter control of DM in order to halt progression of neuropathy. Up to date on eye exam, but has one scheduled soon. Patient will follow-up with MD tomorrow for further evaluation.  Lifestyle Factors: Diet - Patient is attempting to make healthy dietary choices. She is trying to eat more salads and is making at home. She is often packing sandwiches for lunch. Portion sizes are good most of the time. Patient is aware of the need to eat more vegetables and is working to manage portion sizes. We have assessed her food recall for the past 24 hours and have discussed areas in  need of improvement. Food recall for today is as follows: Breakfast - cereal (sugar smacks) with milk (1%) No AM snack Lunch - potato salad, chicken bacon ranch wrap, doritos and Cheetos, sweet tea Supper - [place] pot pie with ham and cheese slices  Beverages - pepsi or mtn dew 3-4 days per week, water or kool aid Exercise - No exercise at this time. Patient is out of the habit of riding her stationary bike and finds that fibromyalgia is a limiting factor. I have asked her to resume riding her bike and to assess whether or not this improves or worsens her fibromyalgia pain.   Plan: 1) Attempt to eat more vegetables and monitor portion sizes. Limit or avoid sodas and sugary beverages. 2) Weight loss goal of 5 lbs over next three months 3) Continue testing regularly 4) Continue medication compliance 5) Follow-up in 3 months on Wed Feb 24th @ 4:00 pm

## 2014-10-12 NOTE — Telephone Encounter (Signed)
Pt utililzed mychart for the information she was needing.

## 2014-10-13 ENCOUNTER — Encounter: Payer: Self-pay | Admitting: Family Medicine

## 2014-10-31 NOTE — Assessment & Plan Note (Signed)
See progress notes

## 2014-10-31 NOTE — Progress Notes (Signed)
Patient ID: Cristina [name], female   DOB: May 20, 1961, 53 y.o.   MRN: [id number] Reviewed: Agree with the documentation and management of our [place].

## 2014-11-09 ENCOUNTER — Other Ambulatory Visit: Payer: Self-pay | Admitting: *Deleted

## 2014-11-09 MED ORDER — AZITHROMYCIN 500 MG PO TABS
ORAL_TABLET | ORAL | Status: DC
Start: 2014-11-09 — End: 2014-11-09

## 2014-11-09 MED ORDER — AZITHROMYCIN 250 MG PO TABS: ORAL_TABLET | ORAL | Status: DC

## 2014-11-10 ENCOUNTER — Ambulatory Visit (INDEPENDENT_AMBULATORY_CARE_PROVIDER_SITE_OTHER): Payer: 59

## 2014-11-10 ENCOUNTER — Ambulatory Visit (INDEPENDENT_AMBULATORY_CARE_PROVIDER_SITE_OTHER): Payer: 59 | Admitting: Family Medicine

## 2014-11-10 ENCOUNTER — Telehealth: Payer: Self-pay

## 2014-11-10 VITALS — BP 112/80 | HR 109 | Temp 97.9°F | Resp 18 | Ht 60.25 in | Wt 242.0 lb

## 2014-11-10 DIAGNOSIS — R059 Cough, unspecified: Secondary | ICD-10-CM

## 2014-11-10 DIAGNOSIS — R300 Dysuria: Secondary | ICD-10-CM

## 2014-11-10 DIAGNOSIS — J9801 Acute bronchospasm: Secondary | ICD-10-CM

## 2014-11-10 DIAGNOSIS — R05 Cough: Secondary | ICD-10-CM

## 2014-11-10 DIAGNOSIS — J22 Unspecified acute lower respiratory infection: Secondary | ICD-10-CM

## 2014-11-10 DIAGNOSIS — R0602 Shortness of breath: Secondary | ICD-10-CM

## 2014-11-10 DIAGNOSIS — J4521 Mild intermittent asthma with (acute) exacerbation: Secondary | ICD-10-CM

## 2014-11-10 DIAGNOSIS — J988 Other specified respiratory disorders: Secondary | ICD-10-CM

## 2014-11-10 DIAGNOSIS — E119 Type 2 diabetes mellitus without complications: Secondary | ICD-10-CM

## 2014-11-10 LAB — POCT UA - MICROSCOPIC ONLY
Casts, Ur, LPF, POC: NEGATIVE
Crystals, Ur, HPF, POC: NEGATIVE
MUCUS UA: NEGATIVE
Yeast, UA: NEGATIVE

## 2014-11-10 LAB — POCT URINALYSIS DIPSTICK
GLUCOSE UA: NEGATIVE
NITRITE UA: NEGATIVE
Protein, UA: 30
Spec Grav, UA: >=1.03
UROBILINOGEN UA: 0.2
pH, UA: 5

## 2014-11-10 MED ORDER — HYDROCOD POLST-CHLORPHEN POLST 10-8 MG/5ML PO LQCR: 5.0000 mL | Freq: Two times a day (BID) | ORAL | Status: DC | PRN

## 2014-11-10 MED ORDER — PREDNISONE 20 MG PO TABS: ORAL_TABLET | ORAL | Status: DC

## 2014-11-10 MED ORDER — MUCINEX DM MAXIMUM STRENGTH 60-1200 MG PO TB12: 1.0000 | ORAL_TABLET | Freq: Two times a day (BID) | ORAL | Status: DC

## 2014-11-10 NOTE — Telephone Encounter (Signed)
Pt of Dr. Tamala Julian is wheezing and coughing up yellow mucus, has started a z pac, she want to know if Dr. Tamala Julian think she needs to be taking something else or come in and be seen.She states her chest is also hurting she thinks from bronchitis

## 2014-11-10 NOTE — Patient Instructions (Signed)

## 2014-11-10 NOTE — Progress Notes (Addendum)
Subjective:  This chart was scribed for Cristina Honour, MD by Ladene Artist, ED Scribe. The patient was seen in room 3. Patient's care was started at 3:17 PM.   Patient ID: Cristina [name], female    DOB: 04-20-1961, 53 y.o.   MRN: [id number]  11/10/2014  Cough  HPI  HPI Comments: [name] is a 53 y.o. female, with a h/o DM, fibromyalgia, hypercholesterolemia, depression, who presents to the [facility] complaining of persistent, productive cough with yellow sputum over the past 4 days. Pt reports associated HA, L ear pain, mild sore throat, postnasal drip, SOB with walking, mild diarrhea, wheezing, intermittent chest pain. Chest pain is exacerbated with coughing. She denies fever, rhinorrhea, congestion. Pt has been treating with Albuterol inhaler every 4 hours and Zpak for 2 days.    DM Blood glucose average 160-170s prior to illness.  HA Pt states that she gets frequent HAs. She wants to set up a CT at next appointment.  Onset two weeks ago. No associated blurred vision, dizziness, focal weakness, paresthesias, slurred speech.  Urinary Symptoms Pt reports recent dysuria and L flank pain. Pt states that she reports less urinary frequency that she attributes to holding her urine. Pt denies vaginal discharge, vaginal itching, constipation, blood in stools.   Denies n/v.  Denies hematuria.  GU Pt states that her hemorrhoids are still bleeding. She has not followed up with her gastroenterologist.    Review of Systems  Constitutional: Negative for fever, chills, diaphoresis and fatigue.  HENT: Positive for ear pain, postnasal drip, sore throat (mild) and voice change. Negative for congestion, rhinorrhea, sinus pressure and trouble swallowing.   Respiratory: Positive for cough, shortness of breath and wheezing.   Cardiovascular: Positive for chest pain (with cough).  Gastrointestinal: Positive for diarrhea (mild). Negative for nausea, vomiting, constipation and  blood in stool.  Genitourinary: Positive for dysuria, urgency, flank pain and decreased urine volume. Negative for frequency, hematuria, vaginal discharge, genital sores, vaginal pain and pelvic pain.  Skin: Negative for rash.  Neurological: Positive for headaches. Negative for dizziness, tremors, seizures, syncope, facial asymmetry, speech difficulty, weakness, light-headedness and numbness.   Past Medical History  Diagnosis Date  . Hemorrhoid   . Fissure, anal   . Fibroids     uterine  . Cystocele   . Rectocele   . Diabetes mellitus   . Genital herpes   . Arthritis   . Depression   . Hyperlipidemia   . Obesity   . GERD (gastroesophageal reflux disease)   . CTS (carpal tunnel syndrome)   . EP (ectopic pregnancy) 1989  . Hx of migraine headaches   . Dysfunction of eustachian tube   . Constipation   . Tobacco use disorder   . Allergic rhinitis, cause unspecified   . Iron deficiency anemia, unspecified   . Insomnia   . OSA on CPAP    Past Surgical History  Procedure Laterality Date  . Cholecystectomy    . Carpal tunnel release  1989    Left  . Ectopic pregnancy surgery  1993  . Tubal ligation  1988  . Cholecystectomy    . Sleep study  09/11/2012    severe OSA.  CPAP titration at 12 cm water pressure.  . Abdominal hysterectomy  11/11/2010    [facility] Plan.  Ovaries intact.  Uterine fibroids with DUB.  . Colonoscopy  06/29/2012    normal.  Repeat in 5 years.  . Esophagogastroduodenoscopy  12/13/2011  dysphagia.  Henrene Pastor.  Normal.   Allergies  Allergen Reactions  . Fish Oil Rash   Current Outpatient Prescriptions  Medication Sig Dispense Refill  . acyclovir (ZOVIRAX) 400 MG tablet TAKE 1 TABLET BY MOUTH 2 (TWO) TIMES DAILY. 60 tablet 9  . albuterol (PROAIR HFA) 108 (90 BASE) MCG/ACT inhaler Inhale 2 puffs into the lungs every 6 (six) hours as needed. 18 g 1  . ALPRAZolam (XANAX) 0.5 MG tablet Take 1 tablet (0.5 mg total) by mouth daily as needed for anxiety. 30 tablet 5  .  AMBULATORY NON FORMULARY MEDICATION Medication Name: Diltizem cream 2% apply as needed to rectum    . ANUCORT-HC 25 MG suppository PLACE 1 SUPPOSITORY RECTALLY 2 (TWO) TIMES DAILY AS NEEDED FOR HEMORRHOIDS. 25 suppository 5  . aspirin EC 81 MG tablet Take 81 mg by mouth daily.    [name] azelastine (ASTELIN) 137 MCG/SPRAY nasal spray Place 2 sprays into both nostrils 2 (two) times daily. Use in each nostril as directed 30 mL 11  . azithromycin (ZITHROMAX) 250 MG tablet 2 TABS  FIRST  DAY   AND THEN 1 TAB  TIMES  2 DAYS 4 tablet 0  . BIOTIN 5000 PO Take by mouth.    [name] buPROPion (WELLBUTRIN SR) 100 MG 12 hr tablet Take 1 tablet (100 mg total) by mouth daily. 90 tablet 3  . Calcium Carbonate-Vitamin D (CALCIUM + D PO) Take 1 tablet by mouth daily.    . cetirizine (ZYRTEC) 10 MG tablet Take 1 tablet (10 mg total) by mouth daily. 90 tablet 3  . Coenzyme Q10 (CO Q 10) 100 MG CAPS Take 400 mg by mouth 3 (three) times daily.    . cyclobenzaprine (FLEXERIL) 10 MG tablet Take 1 tablet (10 mg total) by mouth every 8 (eight) hours as needed for muscle spasms. 60 tablet 0  . ferrous sulfate 325 (65 FE) MG EC tablet Take 325 mg by mouth daily.     . fluticasone (FLONASE) 50 MCG/ACT nasal spray Place 2 sprays into both nostrils daily. 16 g 11  . glipiZIDE (GLUCOTROL XL) 10 MG 24 hr tablet Take 1 tablet (10 mg total) by mouth daily with breakfast. 90 tablet 3  . Glucosamine HCl 1000 MG TABS Take 1,000 mg by mouth 3 (three) times daily.    [name] glucose blood test strip USE TO CHECK BLOOD SUGAR DAILY. DX CODE: 250.00 100 each 3  . hydrocortisone 2.5 % ointment Apply topically 2 (two) times daily. 30 g 0  . ibuprofen (ADVIL,MOTRIN) 800 MG tablet TAKE 1 TABLET BY MOUTH EVERY 8 HOURS AS NEEDED 40 tablet 4  . lisinopril (PRINIVIL,ZESTRIL) 5 MG tablet Take 1 tablet (5 mg total) by mouth daily. 90 tablet 3  . Magnesium 500 MG CAPS Take 1,000 mg by mouth.    . meloxicam (MOBIC) 15 MG tablet Take 1 tablet (15 mg total) by mouth  daily. With food. 30 tablet 3  . metFORMIN (GLUCOPHAGE) 500 MG tablet Take 2 tablets (1,000 mg total) by mouth 2 (two) times daily with a meal. 360 tablet 3  . metroNIDAZOLE (FLAGYL) 500 MG tablet Take 1 tablet (500 mg total) by mouth 2 (two) times daily. 14 tablet 0  . montelukast (SINGULAIR) 10 MG tablet TAKE 1 TABLET (10 MG TOTAL) BY MOUTH AT BEDTIME. 90 tablet 2  . Multiple Vitamin (MULTIVITAMIN) capsule Take 1 capsule by mouth daily.    . mupirocin ointment (BACTROBAN) 2 % Apply 1 application topically 3 (three) times daily.  22 g 1  . pantoprazole (PROTONIX) 40 MG tablet Take 1 tablet (40 mg total) by mouth 2 (two) times daily. 180 tablet 3  . rOPINIRole (REQUIP) 1 MG tablet Take 1 tablet (1 mg total) by mouth at bedtime. 90 tablet 3  . senna (SENOKOT) 8.6 MG tablet Take 1 tablet by mouth daily.    . sertraline (ZOLOFT) 100 MG tablet Take 1.5 tablets (150 mg total) by mouth daily. 135 tablet 3  . simvastatin (ZOCOR) 40 MG tablet Take 1 tablet (40 mg total) by mouth daily at 6 PM. 90 tablet 3  . sitaGLIPtin (JANUVIA) 100 MG tablet TAKE 1 TABLET BY MOUTH ONCE DAILY  NEEDS F/U FOR REFILLS 90 tablet 3  . thiamine (VITAMIN B-1) 100 MG tablet Take 100 mg by mouth daily.    . TRUEPLUS LANCETS 33G MISC USE TO CHECK BLOOD GLUCOSE DAILY AS DIRECTED 100 each 3  . zolpidem (AMBIEN) 5 MG tablet Take 1 tablet (5 mg total) by mouth at bedtime. 30 tablet 5  . chlorpheniramine-HYDROcodone (TUSSIONEX) 10-8 MG/5ML LQCR Take 5 mLs by mouth every 12 (twelve) hours as needed for cough. 120 mL 0  . Dextromethorphan-Guaifenesin (MUCINEX DM MAXIMUM STRENGTH) 60-1200 MG TB12 Take 1 tablet by mouth every 12 (twelve) hours. 20 each 0  . predniSONE (DELTASONE) 20 MG tablet Three tablets daily x 2 days then two tablets daily x 5 days then one tablet daily x 3 days 19 tablet 0  . sitaGLIPtin-metformin (JANUMET) 50-1000 MG per tablet Take 1 tablet by mouth 2 (two) times daily with a meal. (Patient not taking: Reported on  10/05/2014) 180 tablet 3   No current facility-administered medications for this visit.      Objective:    BP 112/80 mmHg  Pulse 109  Temp(Src) 97.9 F (36.6 C) (Oral)  Resp 18  Ht 5' 0.25" (1.53 m)  Wt 242 lb (109.77 kg)  BMI 46.89 kg/m2  SpO2 98% Physical Exam  Constitutional: She is oriented to person, place, and time. She appears well-developed and well-nourished. No distress.  HENT:  Head: Normocephalic and atraumatic.  Right Ear: External ear normal.  Left Ear: External ear normal.  Nose: Mucosal edema and rhinorrhea present. Right sinus exhibits no maxillary sinus tenderness and no frontal sinus tenderness. Left sinus exhibits no maxillary sinus tenderness and no frontal sinus tenderness.  Mouth/Throat: Oropharynx is clear and moist. No oropharyngeal exudate.  Eyes: Conjunctivae and EOM are normal. Pupils are equal, round, and reactive to light.  Neck: Normal range of motion. Neck supple. No thyromegaly present.  Cardiovascular: Normal rate, regular rhythm and normal heart sounds.  Exam reveals no gallop and no friction rub.   No murmur heard. Pulmonary/Chest: Effort normal and breath sounds normal. She has no wheezes. She has no rales.  Tenderness to palpation to anterior chest  Abdominal: She exhibits no distension and no mass. There is tenderness in the left lower quadrant. There is no rebound and no guarding.  Musculoskeletal: Normal range of motion.  Lymphadenopathy:    She has cervical adenopathy.  Neurological: She is alert and oriented to person, place, and time. No cranial nerve deficit. She exhibits normal muscle tone. Coordination normal.  Skin: Skin is warm and dry. She is not diaphoretic.  Psychiatric: She has a normal mood and affect. Her behavior is normal.  Nursing note and vitals reviewed.  Results for orders placed or performed in visit on 11/10/14  Urine culture  Result Value Ref Range   Colony Count >=  100,000 COLONIES/ML    Organism ID, Bacteria  Multiple bacterial morphotypes present, none    Organism ID, Bacteria predominant. Suggest appropriate recollection if     Organism ID, Bacteria clinically indicated.   POCT urinalysis dipstick  Result Value Ref Range   Color, UA dark yellow    Clarity, UA cloduy    Glucose, UA neg    Bilirubin, UA small    Ketones, UA trace    Spec Grav, UA >=1.030    Blood, UA trace-lysed    pH, UA 5.0    Protein, UA 30    Urobilinogen, UA 0.2    Nitrite, UA neg    Leukocytes, UA small (1+)   POCT UA - Microscopic Only  Result Value Ref Range   WBC, Ur, HPF, POC 10-15    RBC, urine, microscopic 5-7    Bacteria, U Microscopic 2+    Mucus, UA neg    Epithelial cells, urine per micros 1-3    Crystals, Ur, HPF, POC neg    Casts, Ur, LPF, POC neg    Yeast, UA neg    Renal tubular cells     [facility] reading (PRIMARY) by  Dr. Tamala Julian.  CXR: NAD      Assessment & Plan:   1. Cough   2. Shortness of breath   3. Bronchospasm   4. Dysuria   5. Lower respiratory infection   6. Type 2 diabetes mellitus without complication   7. Asthma with acute exacerbation, mild intermittent     1. Lower respiratory infection: New.  Continue Zpack as prescribed; rx for Mucinex DM and Tussionex provided. Continue Flonase and Astelin nasal sprays. 2.  Asthma exacerbation: New.  Rx for Prednisone provided; increase Albuterol use to qid for one week and then PRN.  RTC for acute worsening. 3.  Dysuria: New.  Urine culture obtained and negative. 4.  DMII: elevated; monitor closely with acute infection and with Prednisone use.   Meds ordered this encounter  Medications  . predniSONE (DELTASONE) 20 MG tablet    Sig: Three tablets daily x 2 days then two tablets daily x 5 days then one tablet daily x 3 days    Dispense:  19 tablet    Refill:  0  . Dextromethorphan-Guaifenesin (MUCINEX DM MAXIMUM STRENGTH) 60-1200 MG TB12    Sig: Take 1 tablet by mouth every 12 (twelve) hours.    Dispense:  20 each    Refill:  0  .  chlorpheniramine-HYDROcodone (TUSSIONEX) 10-8 MG/5ML LQCR    Sig: Take 5 mLs by mouth every 12 (twelve) hours as needed for cough.    Dispense:  120 mL    Refill:  0    No Follow-up on file.    I personally performed the services described in this documentation, which was scribed in my presence. The recorded information has been reviewed and considered.   Kianna Billet Elayne Guerin, M.D. [facility] [address] [place], [place]  [address] [phone] phone [phone] fax

## 2014-11-10 NOTE — Telephone Encounter (Signed)
Tried to reach pt- mailbox is full

## 2014-11-11 LAB — URINE CULTURE: Colony Count: >=100000

## 2014-11-11 NOTE — Telephone Encounter (Signed)
Pt came in and saw Dr. Tamala Julian on 12/31 in the afternoon.

## 2014-11-23 LAB — HM DIABETES EYE EXAM

## 2014-11-25 ENCOUNTER — Other Ambulatory Visit: Payer: Self-pay

## 2014-11-25 DIAGNOSIS — Z1231 Encounter for screening mammogram for malignant neoplasm of breast: Secondary | ICD-10-CM

## 2014-12-01 ENCOUNTER — Encounter: Payer: Self-pay | Admitting: Family Medicine

## 2014-12-01 ENCOUNTER — Other Ambulatory Visit (INDEPENDENT_AMBULATORY_CARE_PROVIDER_SITE_OTHER): Payer: Self-pay

## 2014-12-01 MED ORDER — CANAGLIFLOZIN 100 MG PO TABS: 100.0000 mg | ORAL_TABLET | Freq: Every day | ORAL | Status: DC

## 2014-12-06 ENCOUNTER — Other Ambulatory Visit (HOSPITAL_COMMUNITY): Payer: Self-pay | Admitting: General Surgery

## 2014-12-19 ENCOUNTER — Ambulatory Visit
Admission: RE | Admit: 2014-12-19 | Discharge: 2014-12-19 | Disposition: A | Discharge status: Home / Self Care | Payer: 59 | Source: Ambulatory Visit

## 2014-12-19 DIAGNOSIS — Z1231 Encounter for screening mammogram for malignant neoplasm of breast: Secondary | ICD-10-CM

## 2014-12-21 ENCOUNTER — Ambulatory Visit: Payer: 59 | Admitting: Internal Medicine

## 2014-12-21 ENCOUNTER — Telehealth: Payer: Self-pay | Admitting: Internal Medicine

## 2014-12-21 NOTE — Telephone Encounter (Signed)
No charge. 

## 2014-12-27 ENCOUNTER — Ambulatory Visit (HOSPITAL_COMMUNITY): Admission: RE | Admit: 2014-12-27 | Payer: 59 | Source: Ambulatory Visit | Admitting: General Surgery

## 2014-12-27 ENCOUNTER — Encounter (HOSPITAL_COMMUNITY): Admission: RE | Payer: Self-pay | Source: Ambulatory Visit

## 2014-12-27 SURGERY — BREATH TEST, FOR HELICOBACTER PYLORI

## 2014-12-29 ENCOUNTER — Ambulatory Visit: Payer: 59 | Admitting: Dietician

## 2015-01-04 ENCOUNTER — Ambulatory Visit (INDEPENDENT_AMBULATORY_CARE_PROVIDER_SITE_OTHER): Payer: Self-pay | Admitting: Family Medicine

## 2015-01-04 ENCOUNTER — Other Ambulatory Visit: Payer: Self-pay | Admitting: Family Medicine

## 2015-01-04 VITALS — BP 118/78 | Ht 61.0 in | Wt 231.0 lb

## 2015-01-04 DIAGNOSIS — E118 Type 2 diabetes mellitus with unspecified complications: Secondary | ICD-10-CM

## 2015-01-09 ENCOUNTER — Ambulatory Visit: Payer: 59 | Admitting: Dietician

## 2015-01-10 NOTE — Progress Notes (Signed)
Patient presents today for 3 month diabetes follow-up as part of the employer-sponsored [employer] program. Current diabetes regimen includes Metformin, glipizide, Januvia, and most recently Invokana. Patient also continues on daily ASA and statin, and is now also taking an ACEi. Most recent MD follow-up was late December 2015. Patient has a pending appt for 3 mo follow-up March 2nd 2016. Of note, as mentioned previously, patient is now on Invokana and Lisinopril for glucose control and kidney protection/hypertension. She is tolerating new medications well.  Diabetes Assessment: Type of Diabetes: Type 2; Sees Diabetes provider 4 or more times per year; takes medications as prescribed; checks feet daily; uses glucometer; takes an aspirin a day; hypoglycemia frequency none; MD managing Diabetes Reginia Forts, MD; Highest CBG 180; Lowest CBG 120; Other Diabetes History: Current med regimen includes Metformin 500 mg two tablets twice daily, glipizide er 5 mg daily, Januvia 100 mg daily, and Invokana 100 mg daily. Patient does maintain good medication compliance. Patient did not bring meter today but is currently testing once daily. Per pateint report fasting is ranging 120-170s and have improved recently. Hypoglycemia frequency is rare. Patient continues to report mild and infrequent stabbing pain in her second toe, that has now spread to third toe and other foot as well. MD is aware. Up to date on eye exam.   Lifestyle Factors: Diet - Patient has made significant dietary changes including limiting soda, more water instead, making healthier food choices and reducing portion sizes. She is attempting to eat smaller more frequent meals. Food recall for today, after dietary changes, is as follows: Breakfast - protein shake with frozen fruit (mixed berry) AM snack - protein bar Lunch - 6 inch flatbread sub, chicken, water PM - fruit, grapes, tangerine, etc Supper - 1/2 sub sandwich on wheat and chips (baked)   Beverages - water, occasional soda Exercise - Pt is now working with a Physiological scientist through [employer] and started using the [facility] fitness facility this week. She plans to meet with the trainer once per week. In addition she is committed to working out at home using stationary bike and aerobics video.   Plan: 1) Great job with diet changes, continue to improve and maintain these changes! 2) Great job with wt loss! Continue to exercise regularly 3) Continue testing regularly 4) Continue medication compliance 5) Follow-up in 3 months on Wednesday May 25th @ 4:00 pm

## 2015-01-11 ENCOUNTER — Ambulatory Visit (INDEPENDENT_AMBULATORY_CARE_PROVIDER_SITE_OTHER): Payer: 59 | Admitting: Family Medicine

## 2015-01-11 ENCOUNTER — Encounter: Payer: Self-pay | Admitting: Family Medicine

## 2015-01-11 VITALS — BP 130/90 | HR 96 | Temp 98.4°F | Resp 16 | Ht 60.75 in | Wt 228.8 lb

## 2015-01-11 DIAGNOSIS — I10 Essential (primary) hypertension: Secondary | ICD-10-CM

## 2015-01-11 DIAGNOSIS — G4733 Obstructive sleep apnea (adult) (pediatric): Secondary | ICD-10-CM | POA: Diagnosis not present

## 2015-01-11 DIAGNOSIS — F419 Anxiety disorder, unspecified: Secondary | ICD-10-CM

## 2015-01-11 DIAGNOSIS — E119 Type 2 diabetes mellitus without complications: Secondary | ICD-10-CM

## 2015-01-11 DIAGNOSIS — E785 Hyperlipidemia, unspecified: Secondary | ICD-10-CM | POA: Diagnosis not present

## 2015-01-11 DIAGNOSIS — Z9989 Dependence on other enabling machines and devices: Secondary | ICD-10-CM

## 2015-01-11 DIAGNOSIS — F418 Other specified anxiety disorders: Secondary | ICD-10-CM | POA: Diagnosis not present

## 2015-01-11 DIAGNOSIS — F32A Depression, unspecified: Secondary | ICD-10-CM

## 2015-01-11 DIAGNOSIS — F329 Major depressive disorder, single episode, unspecified: Secondary | ICD-10-CM

## 2015-01-11 DIAGNOSIS — E669 Obesity, unspecified: Secondary | ICD-10-CM | POA: Diagnosis not present

## 2015-01-11 LAB — LIPID PANEL
CHOLESTEROL: 165 mg/dL (ref 0–200)
HDL: 41 mg/dL — ABNORMAL LOW (ref >=46)
LDL Cholesterol: 101 mg/dL — ABNORMAL HIGH (ref 0–99)
Total CHOL/HDL Ratio: 4 Ratio
Triglycerides: 113 mg/dL (ref <150)
VLDL: 23 mg/dL (ref 0–40)

## 2015-01-11 LAB — CBC WITH DIFFERENTIAL/PLATELET
BASOS PCT: 0 % (ref 0–1)
Basophils Absolute: 0 10*3/uL (ref 0.0–0.1)
EOS ABS: 0.2 10*3/uL (ref 0.0–0.7)
Eosinophils Relative: 2 % (ref 0–5)
HCT: 41.3 % (ref 36.0–46.0)
Hemoglobin: 13.4 g/dL (ref 12.0–15.0)
LYMPHS PCT: 39 % (ref 12–46)
Lymphs Abs: 3.7 10*3/uL (ref 0.7–4.0)
MCH: 27.7 pg (ref 26.0–34.0)
MCHC: 32.4 g/dL (ref 30.0–36.0)
MCV: 85.3 fL (ref 78.0–100.0)
MONO ABS: 0.5 10*3/uL (ref 0.1–1.0)
MPV: 10 fL (ref 8.6–12.4)
Monocytes Relative: 5 % (ref 3–12)
NEUTROS ABS: 5.2 10*3/uL (ref 1.7–7.7)
Neutrophils Relative %: 54 % (ref 43–77)
Platelets: 369 10*3/uL (ref 150–400)
RBC: 4.84 MIL/uL (ref 3.87–5.11)
RDW: 14.7 % (ref 11.5–15.5)
WBC: 9.6 10*3/uL (ref 4.0–10.5)

## 2015-01-11 LAB — COMPLETE METABOLIC PANEL WITH GFR
ALBUMIN: 4.1 g/dL (ref 3.5–5.2)
ALK PHOS: 76 U/L (ref 39–117)
ALT: 83 U/L — ABNORMAL HIGH (ref 0–35)
AST: 66 U/L — ABNORMAL HIGH (ref 0–37)
BUN: 13 mg/dL (ref 6–23)
CO2: 26 mEq/L (ref 19–32)
Calcium: 9.6 mg/dL (ref 8.4–10.5)
Chloride: 103 mEq/L (ref 96–112)
Creat: 0.78 mg/dL (ref 0.50–1.10)
GFR, Est Non African American: 88 mL/min
GLUCOSE: 150 mg/dL — AB (ref 70–99)
POTASSIUM: 4.6 meq/L (ref 3.5–5.3)
Sodium: 139 mEq/L (ref 135–145)
Total Bilirubin: 0.4 mg/dL (ref 0.2–1.2)
Total Protein: 7.5 g/dL (ref 6.0–8.3)

## 2015-01-11 LAB — POCT GLYCOSYLATED HEMOGLOBIN (HGB A1C): Hemoglobin A1C: 9

## 2015-01-11 MED ORDER — CANAGLIFLOZIN 100 MG PO TABS: 100.0000 mg | ORAL_TABLET | Freq: Every day | ORAL | Status: DC

## 2015-01-11 MED ORDER — TERCONAZOLE 0.4 % VA CREA
1.0000 | TOPICAL_CREAM | Freq: Every day | VAGINAL | Status: DC
Start: 2015-01-11 — End: 2016-05-04

## 2015-01-11 MED ORDER — LISINOPRIL 5 MG PO TABS: 5.0000 mg | ORAL_TABLET | Freq: Every day | ORAL | Status: DC

## 2015-01-11 MED ORDER — MONTELUKAST SODIUM 10 MG PO TABS: ORAL_TABLET | ORAL | Status: DC

## 2015-01-11 MED ORDER — ZOLPIDEM TARTRATE 5 MG PO TABS
5.0000 mg | ORAL_TABLET | Freq: Every day | ORAL | Status: DC
Start: 2015-01-11 — End: 2015-08-03

## 2015-01-11 MED ORDER — ACYCLOVIR 400 MG PO TABS: ORAL_TABLET | ORAL | Status: DC

## 2015-01-11 MED ORDER — SIMVASTATIN 40 MG PO TABS: 40.0000 mg | ORAL_TABLET | Freq: Every day | ORAL | Status: DC

## 2015-01-11 NOTE — Patient Instructions (Signed)
Diabetes and Exercise Exercising regularly is important. It is not just about losing weight. It has many health benefits, such as:  Improving your overall fitness, flexibility, and endurance.  Increasing your bone density.  Helping with weight control.  Decreasing your body fat.  Increasing your muscle strength.  Reducing stress and tension.  Improving your overall health. People with diabetes who exercise gain additional benefits because exercise:  Reduces appetite.  Improves the body's use of blood sugar (glucose).  Helps lower or control blood glucose.  Decreases blood pressure.  Helps control blood lipids (such as cholesterol and triglycerides).  Improves the body's use of the hormone insulin by:  Increasing the body's insulin sensitivity.  Reducing the body's insulin needs.  Decreases the risk for heart disease because exercising:  Lowers cholesterol and triglycerides levels.  Increases the levels of good cholesterol (such as high-density lipoproteins [HDL]) in the body.  Lowers blood glucose levels. YOUR ACTIVITY PLAN  Choose an activity that you enjoy and set realistic goals. Your health care provider or diabetes educator can help you make an activity plan that works for you. Exercise regularly as directed by your health care provider. This includes:  Performing resistance training twice a week such as push-ups, sit-ups, lifting weights, or using resistance bands.  Performing 150 minutes of cardio exercises each week such as walking, running, or playing sports.  Staying active and spending no more than 90 minutes at one time being inactive. Even short bursts of exercise are good for you. Three 10-minute sessions spread throughout the day are just as beneficial as a single 30-minute session. Some exercise ideas include:  Taking the dog for a walk.  Taking the stairs instead of the elevator.  Dancing to your favorite song.  Doing an exercise  video.  Doing your favorite exercise with a friend. RECOMMENDATIONS FOR EXERCISING WITH TYPE 1 OR TYPE 2 DIABETES   Check your blood glucose before exercising. If blood glucose levels are greater than 240 mg/dL, check for urine ketones. Do not exercise if ketones are present.  Avoid injecting insulin into areas of the body that are going to be exercised. For example, avoid injecting insulin into:  The arms when playing tennis.  The legs when jogging.  Keep a record of:  Food intake before and after you exercise.  Expected peak times of insulin action.  Blood glucose levels before and after you exercise.  The type and amount of exercise you have done.  Review your records with your health care provider. Your health care provider will help you to develop guidelines for adjusting food intake and insulin amounts before and after exercising.  If you take insulin or oral hypoglycemic agents, watch for signs and symptoms of hypoglycemia. They include:  Dizziness.  Shaking.  Sweating.  Chills.  Confusion.  Drink plenty of water while you exercise to prevent dehydration or heat stroke. Body water is lost during exercise and must be replaced.  Talk to your health care provider before starting an exercise program to make sure it is safe for you. Remember, almost any type of activity is better than none. Document Released: 01/18/2004 Document Revised: 03/14/2014 Document Reviewed: 04/06/2013 [employer] Patient Information 2015 [employer]. This information is not intended to replace advice given to you by your health care provider. Make sure you discuss any questions you have with your health care provider.  

## 2015-01-11 NOTE — Progress Notes (Signed)
Subjective:    Patient ID: Cristina [name], female    DOB: [birth year], 54 y.o.   MRN: [id number]  01/11/2015  Diabetes; Hyperlipidemia; Hypertension; and Depression   HPI This 54 y.o. female presents for three month follow-up:  1.  DMII:  Sugars running 120s-140s.  Taking every morning; sugar was 132 this morning.  HgbA1c today is 9.0.  Was having episodes of 200-300s; no further 300s since losing weight and started Invokana.  Has intermittent itching periodically.   Metformin 1000mg  bid. Januvia 100mg  daily. Glipizide 10mg  one time daily. Invokana 100mg  one time daily.    2.  HTN:  Taking Lisinopril 5mg  one tablet daily.  Blood pressure sparingly; checks BP at bariatric clinic; last check it was 118/82.  Ate chili last night with a lot of salt.   Patient reports good compliance with medication, good tolerance to medication, and good symptom control.    3.  Hyperlipidemia:  Taking Simvastatin once daily.  No changes to management made at last visit.  Patient reports good compliance with medication, good tolerance to medication, and good symptom control.     4.  OSA:  Wearing CPAP nightly.    5.  Anxiety and depression:  Emotionally stable at this time; taking Zoloft 100mg  daily.  Wellbutrin once daily.    6. Obesity: seeing bariatric clinic on [place]; Phentermine 37.5, B12 injections weekly, HCG injections every week.  Also exercising.  Curbed appetite.  S/[facility] consultation; completed all requirements except for psychiatry and nutritionist; also must undergo stomach evaluation.  Decided to try bariatric clinic for six months before committing to surgery; wanted to complete sleeve but surgeon recommended full Roux-n-Y due to GERD severity which can worsen GERD.  Started with bariatric clinic three weeks ago.  Lost ten pounds in three weeks.     Review of Systems  Constitutional: Negative for fever, chills, diaphoresis and fatigue.  Eyes: Negative for visual  disturbance.  Respiratory: Negative for cough and shortness of breath.   Cardiovascular: Negative for chest pain, palpitations and leg swelling.  Gastrointestinal: Negative for nausea, vomiting, abdominal pain, diarrhea and constipation.  Endocrine: Negative for cold intolerance, heat intolerance, polydipsia, polyphagia and polyuria.  Neurological: Negative for dizziness, tremors, seizures, syncope, facial asymmetry, speech difficulty, weakness, light-headedness, numbness and headaches.  Psychiatric/Behavioral: Negative for sleep disturbance and dysphoric mood. The patient is not nervous/anxious.     Past Medical History  Diagnosis Date  . Hemorrhoid   . Fissure, anal   . Fibroids     uterine  . Cystocele   . Rectocele   . Diabetes mellitus   . Genital herpes   . Arthritis   . Depression   . Hyperlipidemia   . Obesity   . GERD (gastroesophageal reflux disease)   . CTS (carpal tunnel syndrome)   . EP (ectopic pregnancy) 1989  . Hx of migraine headaches   . Dysfunction of eustachian tube   . Constipation   . Tobacco use disorder   . Allergic rhinitis, cause unspecified   . Iron deficiency anemia, unspecified   . Insomnia   . OSA on CPAP    Past Surgical History  Procedure Laterality Date  . Cholecystectomy    . Carpal tunnel release  1989    Left  . Ectopic pregnancy surgery  1993  . Tubal ligation  1988  . Cholecystectomy    . Sleep study  09/11/2012    severe OSA.  CPAP titration at 12 cm water pressure.  [name]  Abdominal hysterectomy  11/11/2010    [place] [facility].  Ovaries intact.  Uterine fibroids with DUB.  . Colonoscopy  06/29/2012    normal.  Repeat in 5 years.  . Esophagogastroduodenoscopy  12/13/2011    dysphagia.  Henrene Pastor.  Normal.   Allergies  Allergen Reactions  . Fish Oil Rash   Current Outpatient Prescriptions  Medication Sig Dispense Refill  . acyclovir (ZOVIRAX) 400 MG tablet TAKE 1 TABLET BY MOUTH 2 (TWO) TIMES DAILY. 180 tablet 3  . albuterol (PROAIR HFA) 108  (90 BASE) MCG/ACT inhaler Inhale 2 puffs into the lungs every 6 (six) hours as needed. 18 g 1  . ALPRAZolam (XANAX) 0.5 MG tablet Take 1 tablet (0.5 mg total) by mouth daily as needed for anxiety. 30 tablet 5  . AMBULATORY NON FORMULARY MEDICATION Medication Name: Diltizem cream 2% apply as needed to rectum    . ANUCORT-HC 25 MG suppository PLACE 1 SUPPOSITORY RECTALLY 2 (TWO) TIMES DAILY AS NEEDED FOR HEMORRHOIDS. 25 suppository 5  . aspirin EC 81 MG tablet Take 81 mg by mouth daily.    [name] azelastine (ASTELIN) 137 MCG/SPRAY nasal spray Place 2 sprays into both nostrils 2 (two) times daily. Use in each nostril as directed 30 mL 11  . BIOTIN 5000 PO Take by mouth.    [name] buPROPion (WELLBUTRIN SR) 100 MG 12 hr tablet Take 1 tablet (100 mg total) by mouth daily. 90 tablet 3  . Calcium Carbonate-Vitamin D (CALCIUM + D PO) Take 1 tablet by mouth daily.    . canagliflozin (INVOKANA) 100 MG TABS tablet Take 1 tablet (100 mg total) by mouth daily. 90 tablet 3  . cetirizine (ZYRTEC) 10 MG tablet Take 1 tablet (10 mg total) by mouth daily. 90 tablet 3  . chlorpheniramine-HYDROcodone (TUSSIONEX) 10-8 MG/5ML LQCR Take 5 mLs by mouth every 12 (twelve) hours as needed for cough. 120 mL 0  . cyclobenzaprine (FLEXERIL) 10 MG tablet Take 1 tablet (10 mg total) by mouth every 8 (eight) hours as needed for muscle spasms. 60 tablet 0  . ferrous sulfate 325 (65 FE) MG EC tablet Take 325 mg by mouth daily.     . fluticasone (FLONASE) 50 MCG/ACT nasal spray Place 2 sprays into both nostrils daily. 16 g 11  . glipiZIDE (GLUCOTROL XL) 10 MG 24 hr tablet Take 1 tablet (10 mg total) by mouth daily with breakfast. 90 tablet 3  . Glucosamine HCl 1000 MG TABS Take 1,000 mg by mouth 3 (three) times daily.    [name] glucose blood test strip USE TO CHECK BLOOD SUGAR DAILY. DX CODE: 250.00 100 each 3  . hydrocortisone 2.5 % ointment Apply topically 2 (two) times daily. 30 g 0  . ibuprofen (ADVIL,MOTRIN) 800 MG tablet TAKE 1 TABLET BY  MOUTH EVERY 8 HOURS AS NEEDED 40 tablet 4  . lisinopril (PRINIVIL,ZESTRIL) 5 MG tablet Take 1 tablet (5 mg total) by mouth daily. 90 tablet 3  . meloxicam (MOBIC) 15 MG tablet Take 1 tablet (15 mg total) by mouth daily. With food. 30 tablet 3  . metFORMIN (GLUCOPHAGE) 500 MG tablet Take 2 tablets (1,000 mg total) by mouth 2 (two) times daily with a meal. 360 tablet 3  . montelukast (SINGULAIR) 10 MG tablet TAKE 1 TABLET (10 MG TOTAL) BY MOUTH AT BEDTIME. 90 tablet 3  . Multiple Vitamin (MULTIVITAMIN) capsule Take 1 capsule by mouth daily.    . pantoprazole (PROTONIX) 40 MG tablet Take 1 tablet (40 mg total) by mouth 2 (two)  times daily. 180 tablet 3  . rOPINIRole (REQUIP) 1 MG tablet Take 1 tablet (1 mg total) by mouth at bedtime. 90 tablet 3  . senna (SENOKOT) 8.6 MG tablet Take 1 tablet by mouth daily.    . sertraline (ZOLOFT) 100 MG tablet Take 1.5 tablets (150 mg total) by mouth daily. 135 tablet 3  . simvastatin (ZOCOR) 40 MG tablet Take 1 tablet (40 mg total) by mouth daily at 6 PM. 90 tablet 3  . sitaGLIPtin (JANUVIA) 100 MG tablet TAKE 1 TABLET BY MOUTH ONCE DAILY  NEEDS F/U FOR REFILLS 90 tablet 3  . TRUEPLUS LANCETS 33G MISC USE TO CHECK BLOOD GLUCOSE DAILY AS DIRECTED 100 each 3  . zolpidem (AMBIEN) 5 MG tablet Take 1 tablet (5 mg total) by mouth at bedtime. 30 tablet 5  . Dextromethorphan-Guaifenesin (MUCINEX DM MAXIMUM STRENGTH) 60-1200 MG TB12 Take 1 tablet by mouth every 12 (twelve) hours. (Patient not taking: Reported on 01/11/2015) 20 each 0  . Magnesium 500 MG CAPS Take 1,000 mg by mouth.    . mupirocin ointment (BACTROBAN) 2 % Apply 1 application topically 3 (three) times daily. (Patient not taking: Reported on 01/11/2015) 22 g 1  . terconazole (TERAZOL 7) 0.4 % vaginal cream Place 1 applicator vaginally at bedtime. 45 g 1  . thiamine (VITAMIN B-1) 100 MG tablet Take 100 mg by mouth daily.     No current facility-administered medications for this visit.       Objective:    BP  130/90 mmHg  Pulse 96  Temp(Src) 98.4 F (36.9 C) (Oral)  Resp 16  Ht 5' 0.75" (1.543 m)  Wt 228 lb 12.8 oz (103.783 kg)  BMI 43.59 kg/m2  SpO2 96% Physical Exam  Constitutional: She is oriented to person, place, and time. She appears well-developed and well-nourished. No distress.  obese  HENT:  Head: Normocephalic and atraumatic.  Right Ear: External ear normal.  Left Ear: External ear normal.  Nose: Nose normal.  Mouth/Throat: Oropharynx is clear and moist.  Eyes: Conjunctivae and EOM are normal. Pupils are equal, round, and reactive to light.  Neck: Normal range of motion. Neck supple. Carotid bruit is not present. No thyromegaly present.  Cardiovascular: Normal rate, regular rhythm, normal heart sounds and intact distal pulses.  Exam reveals no gallop and no friction rub.   No murmur heard. Pulmonary/Chest: Effort normal and breath sounds normal. She has no wheezes. She has no rales.  Abdominal: Soft. Bowel sounds are normal. She exhibits no distension and no mass. There is no tenderness. There is no rebound and no guarding.  Lymphadenopathy:    She has no cervical adenopathy.  Neurological: She is alert and oriented to person, place, and time. No cranial nerve deficit.  Skin: Skin is warm and dry. No rash noted. She is not diaphoretic. No erythema. No pallor.  Psychiatric: She has a normal mood and affect. Her behavior is normal.        Assessment & Plan:   1. Diabetes mellitus without complication   2. Hyperlipidemia   3. Essential hypertension, benign   4. Anxiety and depression   5. Obesity (BMI 30-39.9)   6. OSA on CPAP     1. DMII: uncontrolled; tolerating Invokana 100mg  daily; continue Metformin, Januvia, Glucotrol at current doses.  Continue to work on weight loss and exercise.  Obtain labs. 2.  Hyperlipidemia: controlled; obtain labs; continue current medications. 3.  HTN: borderline control; continue current medication at current dose; if remains elevated  at next visit, increase Lisinopril dose.  Home readings are normal. 4.  Anxiety and depression: stable at this time; continue Zoloft and Wellbutrin. 5.  OSA: controlled on CPAP; recommend weight loss. 6.  Obesity: improving with assistance from Bariatric Clinic.  Continue to work on weight loss and exercise.   Meds ordered this encounter  Medications  . terconazole (TERAZOL 7) 0.4 % vaginal cream    Sig: Place 1 applicator vaginally at bedtime.    Dispense:  45 g    Refill:  1  . lisinopril (PRINIVIL,ZESTRIL) 5 MG tablet    Sig: Take 1 tablet (5 mg total) by mouth daily.    Dispense:  90 tablet    Refill:  3  . simvastatin (ZOCOR) 40 MG tablet    Sig: Take 1 tablet (40 mg total) by mouth daily at 6 PM.    Dispense:  90 tablet    Refill:  3  . montelukast (SINGULAIR) 10 MG tablet    Sig: TAKE 1 TABLET (10 MG TOTAL) BY MOUTH AT BEDTIME.    Dispense:  90 tablet    Refill:  3    PATIENT REQUESTS 90 DAY RX  . acyclovir (ZOVIRAX) 400 MG tablet    Sig: TAKE 1 TABLET BY MOUTH 2 (TWO) TIMES DAILY.    Dispense:  180 tablet    Refill:  3  . zolpidem (AMBIEN) 5 MG tablet    Sig: Take 1 tablet (5 mg total) by mouth at bedtime.    Dispense:  30 tablet    Refill:  5  . canagliflozin (INVOKANA) 100 MG TABS tablet    Sig: Take 1 tablet (100 mg total) by mouth daily.    Dispense:  90 tablet    Refill:  3    Return in about 3 months (around 04/13/2015) for recheck.    Siddhi Dornbush Elayne Guerin, M.D. [facility] [address] [place], [place]  [address] [phone] phone [phone] fax

## 2015-01-12 ENCOUNTER — Encounter: Payer: Self-pay | Admitting: Family Medicine

## 2015-01-12 NOTE — Addendum Note (Signed)
Addended by: [name] on: 01/12/2015 08:52 AM   Modules accepted: Orders, Medications

## 2015-01-14 ENCOUNTER — Ambulatory Visit: Payer: 59

## 2015-01-17 NOTE — Progress Notes (Signed)
Patient ID: Cristina [name], female   DOB: 29-Aug-1961, 54 y.o.   MRN: [id number] ATTENDING PHYSICIAN NOTE: I have reviewed the chart and agree with the plan as detailed above. Dorcas Mcmurray MD Pager [phone]

## 2015-01-18 ENCOUNTER — Encounter: Payer: Self-pay | Admitting: Family Medicine

## 2015-01-18 ENCOUNTER — Telehealth: Payer: Self-pay

## 2015-01-18 NOTE — Telephone Encounter (Signed)
Patients FMLA ppw has been scanned and faxed

## 2015-01-20 ENCOUNTER — Encounter: Payer: Self-pay | Admitting: Family Medicine

## 2015-01-26 ENCOUNTER — Encounter: Payer: Self-pay | Admitting: Family Medicine

## 2015-01-26 ENCOUNTER — Telehealth: Payer: Self-pay

## 2015-01-26 NOTE — Telephone Encounter (Signed)
Please call for clarification --- what does patient need me to do?

## 2015-01-26 NOTE — Telephone Encounter (Signed)
Patient states that FMLA was done in [place] with Dr. Tamala Julian, and she just found out that [employer] will only allow one diagnostic code per form. Please advise patient.

## 2015-01-27 NOTE — Telephone Encounter (Signed)
Phone rang multiple times then went to busy signal.

## 2015-01-27 NOTE — Telephone Encounter (Signed)
Patient wants to be out of work for Depression.   [phone]

## 2015-01-30 NOTE — Telephone Encounter (Signed)
Spoke to pt, With her FMLA paperwork, diabetes and depression were the diagnosis.  [employer] states she she will need a separate form per diagnosis. So she will need one form completed for diabetes and one for depression. Pt will bring forms to [address] for dr Tamala Julian to complete.

## 2015-01-31 NOTE — Telephone Encounter (Signed)
Received Corrections needed from [employer], will place forms in Dr. Thompson Caul box for completion.

## 2015-02-01 ENCOUNTER — Telehealth: Payer: Self-pay

## 2015-02-17 DIAGNOSIS — Z0271 Encounter for disability determination: Secondary | ICD-10-CM

## 2015-02-28 ENCOUNTER — Encounter: Payer: Self-pay | Admitting: Family Medicine

## 2015-02-28 ENCOUNTER — Other Ambulatory Visit: Payer: Self-pay | Admitting: Family Medicine

## 2015-03-01 MED ORDER — METFORMIN HCL 500 MG PO TABS: 1000.0000 mg | ORAL_TABLET | Freq: Two times a day (BID) | ORAL | Status: DC

## 2015-03-01 MED ORDER — FLUTICASONE PROPIONATE 50 MCG/ACT NA SUSP: NASAL | Status: DC

## 2015-03-01 MED ORDER — PANTOPRAZOLE SODIUM 40 MG PO TBEC: 40.0000 mg | DELAYED_RELEASE_TABLET | Freq: Two times a day (BID) | ORAL | Status: DC

## 2015-03-01 NOTE — Telephone Encounter (Signed)
Dr Tamala Julian, you saw pt for check up in March, but don't see this med discussed. Can we RF?

## 2015-03-02 ENCOUNTER — Other Ambulatory Visit: Payer: Self-pay | Admitting: Family Medicine

## 2015-03-07 MED ORDER — FLUTICASONE PROPIONATE 50 MCG/ACT NA SUSP: NASAL | Status: DC

## 2015-03-07 MED ORDER — METFORMIN HCL 500 MG PO TABS: 1000.0000 mg | ORAL_TABLET | Freq: Two times a day (BID) | ORAL | Status: DC

## 2015-03-07 MED ORDER — PANTOPRAZOLE SODIUM 40 MG PO TBEC: 40.0000 mg | DELAYED_RELEASE_TABLET | Freq: Two times a day (BID) | ORAL | Status: DC

## 2015-03-07 NOTE — Addendum Note (Signed)
Addended by: [name] on: 03/07/2015 11:20 AM   Modules accepted: Orders

## 2015-03-07 NOTE — Telephone Encounter (Signed)
Refills approved.

## 2015-03-18 ENCOUNTER — Ambulatory Visit (INDEPENDENT_AMBULATORY_CARE_PROVIDER_SITE_OTHER): Payer: 59 | Admitting: Internal Medicine

## 2015-03-18 ENCOUNTER — Ambulatory Visit (INDEPENDENT_AMBULATORY_CARE_PROVIDER_SITE_OTHER): Payer: 59

## 2015-03-18 VITALS — BP 120/80 | HR 108 | Temp 98.9°F | Resp 24 | Ht 60.75 in | Wt 220.1 lb

## 2015-03-18 DIAGNOSIS — M25571 Pain in right ankle and joints of right foot: Secondary | ICD-10-CM

## 2015-03-18 NOTE — Progress Notes (Signed)
Subjective:    Patient ID: Cristina [name], female    DOB: [birth year], 54 y.o.   MRN: [id number]  HPI HPI Comments: Cristina [name] is a 54 y.o. female who presents to the [facility] complaining of a right foot injury onset this morning. She states that she twisted her ankle and fell. She reports pain centrally to the right ankle and foot.   Active Ambulatory Problems    Diagnosis Date Noted  . Obesity 01/08/2007  . CONSTIPATION 01/08/2007  . Diabetes mellitus, type 2 12/31/2010  . ANAL FISSURE 12/31/2010  . DYSPHAGIA UNSPECIFIED 12/31/2010  . Other and unspecified hyperlipidemia 10/01/2012  . Iron deficiency anemia, unspecified 10/01/2012  . Skin lesions, generalized 10/01/2012  . Pure hypercholesterolemia 10/20/2012  . OSA on CPAP 10/20/2012  . Depression 10/20/2012  . Restless leg syndrome 11/24/2012  . Chest pain 01/25/2013  . Sciatica 01/25/2013  . Metatarsalgia of both feet 03/11/2013  . Routine general medical examination at a health care facility 05/24/2013  . Routine gynecological examination 05/24/2013  . Cervical strain 06/21/2013  . Low back pain 06/21/2013  . Osteoarthritis 12/20/2013  . Elevated LFTs 03/28/2014  . Allergic rhinitis, cause unspecified 03/28/2014  . Elevated BP 03/28/2014  . Insomnia 05/12/2014  . Fibromyalgia 05/12/2014   Resolved Ambulatory Problems    Diagnosis Date Noted  . UTERINE FIBROID 01/08/2007  . GLUCOSE INTOLERANCE 01/08/2007  . RECTAL PAIN 12/31/2010  . Neck strain 10/01/2012  . Snoring 10/01/2012  . Fatigue 10/01/2012   Past Medical History  Diagnosis Date  . Hemorrhoid   . Fissure, anal   . Fibroids   . Cystocele   . Rectocele   . Diabetes mellitus   . Genital herpes   . Arthritis   . Hyperlipidemia   . GERD (gastroesophageal reflux disease)   . CTS (carpal tunnel syndrome)   . EP (ectopic pregnancy) 1989  . Hx of migraine headaches   . Dysfunction of eustachian tube   . Constipation   . Tobacco  use disorder   . Hypertension    Prior to Admission medications   Medication Sig Start Date End Date Taking? Authorizing Provider  acyclovir (ZOVIRAX) 400 MG tablet TAKE 1 TABLET BY MOUTH 2 (TWO) TIMES DAILY. 01/11/15  Yes [name], MD  albuterol (PROAIR HFA) 108 (90 BASE) MCG/ACT inhaler Inhale 2 puffs into the lungs every 6 (six) hours as needed. 02/04/14  Yes [name], MD  ALPRAZolam Duanne Moron) 0.5 MG tablet Take 1 tablet (0.5 mg total) by mouth daily as needed for anxiety. 10/05/14  Yes [name], MD  AMBULATORY NON FORMULARY MEDICATION Medication Name: Diltizem cream 2% apply as needed to rectum   Yes Historical Provider, MD  ANUCORT-HC 25 MG suppository PLACE 1 SUPPOSITORY RECTALLY 2 (TWO) TIMES DAILY AS NEEDED FOR HEMORRHOIDS. 09/27/14  Yes [name], MD  aspirin EC 81 MG tablet Take 81 mg by mouth daily.   Yes Historical Provider, MD  azelastine (ASTELIN) 137 MCG/SPRAY nasal spray Place 2 sprays into both nostrils 2 (two) times daily. Use in each nostril as directed 02/04/14  Yes [name], MD  BIOTIN 5000 PO Take by mouth.   Yes Historical Provider, MD  buPROPion (WELLBUTRIN SR) 100 MG 12 hr tablet Take 1 tablet (100 mg total) by mouth daily. 06/04/14  Yes [name], MD  Calcium Carbonate-Vitamin D (CALCIUM + D PO) Take 1 tablet by mouth daily.   Yes Historical Provider,  MD  canagliflozin (INVOKANA) 100 MG TABS tablet Take 1 tablet (100 mg total) by mouth daily. 01/11/15  Yes [name], MD  cetirizine (ZYRTEC) 10 MG tablet Take 1 tablet (10 mg total) by mouth daily. 02/04/14  Yes [name], MD  ferrous sulfate 325 (65 FE) MG EC tablet Take 325 mg by mouth daily.    Yes Historical Provider, MD  fluticasone (FLONASE) 50 MCG/ACT nasal spray PLACE 2 SPRAYS INTO BOTH NOSTRILS DAILY. 03/01/15  Yes [name], MD  glipiZIDE (GLUCOTROL XL) 10 MG 24 hr tablet Take 1 tablet (10 mg total) by mouth daily with breakfast. 10/05/14  Yes [name], MD  Glucosamine  HCl 1000 MG TABS Take 1,000 mg by mouth 3 (three) times daily.   Yes Historical Provider, MD  glucose blood test strip USE TO CHECK BLOOD SUGAR DAILY. DX CODE: 250.00 02/04/14  Yes [name], MD  hydrocortisone 2.5 % ointment Apply topically 2 (two) times daily. 07/30/14  Yes [name], MD  ibuprofen (ADVIL,MOTRIN) 800 MG tablet TAKE 1 TABLET BY MOUTH EVERY 8 HOURS AS NEEDED 01/19/14  Yes [name], MD  lisinopril (PRINIVIL,ZESTRIL) 5 MG tablet Take 1 tablet (5 mg total) by mouth daily. 01/11/15  Yes [name], MD  metFORMIN (GLUCOPHAGE) 500 MG tablet Take 2 tablets (1,000 mg total) by mouth 2 (two) times daily with a meal. 03/07/15  Yes [name], MD  montelukast (SINGULAIR) 10 MG tablet TAKE 1 TABLET (10 MG TOTAL) BY MOUTH AT BEDTIME. 01/11/15  Yes [name], MD  Multiple Vitamin (MULTIVITAMIN) capsule Take 1 capsule by mouth daily.   Yes Historical Provider, MD  pantoprazole (PROTONIX) 40 MG tablet Take 1 tablet (40 mg total) by mouth 2 (two) times daily. 03/07/15  Yes [name], MD  rOPINIRole (REQUIP) 1 MG tablet Take 1 tablet (1 mg total) by mouth at bedtime. 05/10/14  Yes [name], MD  senna (SENOKOT) 8.6 MG tablet Take 1 tablet by mouth daily.   Yes Historical Provider, MD  sertraline (ZOLOFT) 100 MG tablet Take 1.5 tablets (150 mg total) by mouth daily. 06/29/14  Yes [name], MD  simvastatin (ZOCOR) 40 MG tablet Take 1 tablet (40 mg total) by mouth daily at 6 PM. 01/11/15  Yes [name], MD  sitaGLIPtin (JANUVIA) 100 MG tablet TAKE 1 TABLET BY MOUTH ONCE DAILY  NEEDS F/U FOR REFILLS 10/05/14  Yes [name], MD  terconazole (TERAZOL 7) 0.4 % vaginal cream Place 1 applicator vaginally at bedtime. 01/11/15  Yes [name], MD  TRUEPLUS LANCETS 33G MISC USE TO CHECK BLOOD GLUCOSE DAILY AS DIRECTED   Yes [name], MD  zolpidem (AMBIEN) 5 MG tablet Take 1 tablet (5 mg total) by mouth at bedtime. 01/11/15  Yes [name], MD   Review of  Systems  Musculoskeletal: Positive for arthralgias.      Objective:   Physical Exam  Constitutional: She is oriented to person, place, and time. She appears well-developed and well-nourished.  HENT:  Head: Normocephalic and atraumatic.  Eyes: EOM are normal.  Neck: Normal range of motion. Neck supple.  Cardiovascular: Normal rate.   Pulmonary/Chest: Effort normal.  Musculoskeletal: Normal range of motion.  Has swelling, ecchymosis and tenderness right lateral foot over the proximal 5th metatarsal. Ankle uninvolved, achilles intact; pain with any ROM  Neurological: She is alert and oriented to person, place, and time.  Skin: Skin  is warm and dry.  Psychiatric: She has a normal mood and affect. Her behavior is normal.  Nursing note and vitals reviewed. [facility] reading (PRIMARY) by  Dr. Laney Pastor nondisplaced fracture of proximal fifth metatarsal      Assessment & Plan:  I have completed the patient encounter in its entirety as documented by the scribe, with editing by me where necessary. Xavyer Steenson P. Laney Pastor, M.D.  Problem #1 nondisplaced fracture of the proximal fifth metatarsal in a diabetic patient  Plan [facility] Walker/ice /elevate 48h Ok to work sitting Call if needs crutches Refer to orthopedics/sports med for definitive care

## 2015-03-21 ENCOUNTER — Ambulatory Visit (INDEPENDENT_AMBULATORY_CARE_PROVIDER_SITE_OTHER): Payer: 59 | Admitting: Family Medicine

## 2015-03-21 ENCOUNTER — Encounter: Payer: Self-pay | Admitting: Family Medicine

## 2015-03-21 VITALS — BP 110/78 | HR 99 | Ht 60.0 in | Wt 220.0 lb

## 2015-03-21 DIAGNOSIS — S92351A Displaced fracture of fifth metatarsal bone, right foot, initial encounter for closed fracture: Secondary | ICD-10-CM

## 2015-03-21 NOTE — Patient Instructions (Signed)
You have an avulsion fracture of your fifth metatarsal. This should heal well over 6-8 weeks. Wear boot every time you're up and walking around. Crutches as needed. Icing 15 minutes at a time 3-4 times a day. Elevate above your heart when possible. Follow up with me in 2 weeks.

## 2015-03-22 DIAGNOSIS — S92351A Displaced fracture of fifth metatarsal bone, right foot, initial encounter for closed fracture: Secondary | ICD-10-CM | POA: Insufficient documentation

## 2015-03-22 NOTE — Progress Notes (Signed)
PCP: SMITH,KRISTI, MD  Subjective:   HPI: Patient is a 54 y.o. female here for right ankle injury.  Patient reports on 5/7 she was going out the door when she twisted her right foot and fell to the ground. Pain lateral right foot with swelling, bruising. Difficulty bearing weight after this. Pain level up to 8/10 now. Taking tylenol. Radiographs showed avulsion fracture of 5th metatarsal. Wearing cam walker.  Past Medical History  Diagnosis Date  . Hemorrhoid   . Fissure, anal   . Fibroids     uterine  . Cystocele   . Rectocele   . Diabetes mellitus   . Genital herpes   . Arthritis   . Depression   . Hyperlipidemia   . Obesity   . GERD (gastroesophageal reflux disease)   . CTS (carpal tunnel syndrome)   . EP (ectopic pregnancy) 1989  . Hx of migraine headaches   . Dysfunction of eustachian tube   . Constipation   . Tobacco use disorder   . Allergic rhinitis, cause unspecified   . Iron deficiency anemia, unspecified   . Insomnia   . OSA on CPAP   . Hypertension     Current Outpatient Prescriptions on File Prior to Visit  Medication Sig Dispense Refill  . acyclovir (ZOVIRAX) 400 MG tablet TAKE 1 TABLET BY MOUTH 2 (TWO) TIMES DAILY. 180 tablet 3  . albuterol (PROAIR HFA) 108 (90 BASE) MCG/ACT inhaler Inhale 2 puffs into the lungs every 6 (six) hours as needed. 18 g 1  . ALPRAZolam (XANAX) 0.5 MG tablet Take 1 tablet (0.5 mg total) by mouth daily as needed for anxiety. 30 tablet 5  . AMBULATORY NON FORMULARY MEDICATION Medication Name: Diltizem cream 2% apply as needed to rectum    . ANUCORT-HC 25 MG suppository PLACE 1 SUPPOSITORY RECTALLY 2 (TWO) TIMES DAILY AS NEEDED FOR HEMORRHOIDS. 25 suppository 5  . aspirin EC 81 MG tablet Take 81 mg by mouth daily.    [name] azelastine (ASTELIN) 137 MCG/SPRAY nasal spray Place 2 sprays into both nostrils 2 (two) times daily. Use in each nostril as directed 30 mL 11  . BIOTIN 5000 PO Take by mouth.    [name] buPROPion (WELLBUTRIN SR) 100  MG 12 hr tablet Take 1 tablet (100 mg total) by mouth daily. 90 tablet 3  . Calcium Carbonate-Vitamin D (CALCIUM + D PO) Take 1 tablet by mouth daily.    . canagliflozin (INVOKANA) 100 MG TABS tablet Take 1 tablet (100 mg total) by mouth daily. 90 tablet 3  . cetirizine (ZYRTEC) 10 MG tablet Take 1 tablet (10 mg total) by mouth daily. 90 tablet 3  . ferrous sulfate 325 (65 FE) MG EC tablet Take 325 mg by mouth daily.     . fluticasone (FLONASE) 50 MCG/ACT nasal spray PLACE 2 SPRAYS INTO BOTH NOSTRILS DAILY. 48 g 2  . glipiZIDE (GLUCOTROL XL) 10 MG 24 hr tablet Take 1 tablet (10 mg total) by mouth daily with breakfast. 90 tablet 3  . Glucosamine HCl 1000 MG TABS Take 1,000 mg by mouth 3 (three) times daily.    [name] glucose blood test strip USE TO CHECK BLOOD SUGAR DAILY. DX CODE: 250.00 100 each 3  . hydrocortisone 2.5 % ointment Apply topically 2 (two) times daily. 30 g 0  . ibuprofen (ADVIL,MOTRIN) 800 MG tablet TAKE 1 TABLET BY MOUTH EVERY 8 HOURS AS NEEDED 40 tablet 4  . lisinopril (PRINIVIL,ZESTRIL) 5 MG tablet Take 1 tablet (5 mg total) by  mouth daily. 90 tablet 3  . metFORMIN (GLUCOPHAGE) 500 MG tablet Take 2 tablets (1,000 mg total) by mouth 2 (two) times daily with a meal. 360 tablet 4  . montelukast (SINGULAIR) 10 MG tablet TAKE 1 TABLET (10 MG TOTAL) BY MOUTH AT BEDTIME. 90 tablet 3  . Multiple Vitamin (MULTIVITAMIN) capsule Take 1 capsule by mouth daily.    . pantoprazole (PROTONIX) 40 MG tablet Take 1 tablet (40 mg total) by mouth 2 (two) times daily. 180 tablet 1  . rOPINIRole (REQUIP) 1 MG tablet Take 1 tablet (1 mg total) by mouth at bedtime. 90 tablet 3  . senna (SENOKOT) 8.6 MG tablet Take 1 tablet by mouth daily.    . sertraline (ZOLOFT) 100 MG tablet Take 1.5 tablets (150 mg total) by mouth daily. 135 tablet 3  . simvastatin (ZOCOR) 40 MG tablet Take 1 tablet (40 mg total) by mouth daily at 6 PM. 90 tablet 3  . sitaGLIPtin (JANUVIA) 100 MG tablet TAKE 1 TABLET BY MOUTH ONCE DAILY   NEEDS F/U FOR REFILLS 90 tablet 3  . terconazole (TERAZOL 7) 0.4 % vaginal cream Place 1 applicator vaginally at bedtime. 45 g 1  . TRUEPLUS LANCETS 33G MISC USE TO CHECK BLOOD GLUCOSE DAILY AS DIRECTED 100 each 3  . zolpidem (AMBIEN) 5 MG tablet Take 1 tablet (5 mg total) by mouth at bedtime. 30 tablet 5   No current facility-administered medications on file prior to visit.    Past Surgical History  Procedure Laterality Date  . Cholecystectomy    . Carpal tunnel release  1989    Left  . Ectopic pregnancy surgery  1993  . Tubal ligation  1988  . Cholecystectomy    . Sleep study  09/11/2012    severe OSA.  CPAP titration at 12 cm water pressure.  . Abdominal hysterectomy  11/11/2010    [facility].  Ovaries intact.  Uterine fibroids with DUB.  . Colonoscopy  06/29/2012    normal.  Repeat in 5 years.  . Esophagogastroduodenoscopy  12/13/2011    dysphagia.  Hen[facility] Pastor.  Normal.    Allergies  Allergen Reactions  . Fish Oil Rash    History   Social History  . Marital Status: Single    Spouse Name: n/a  . Number of Children: 3  . Years of Education: college   Occupational History  . NURSE SECRETARY     [employer]   Social History Main Topics  . Smoking status: Former Research scientist (life sciences)  . Smokeless tobacco: Never Used     Comment: smoked occasionally longest 6 mos.  . Alcohol Use: 0.6 oz/week    1 Glasses of wine per week     Comment: 2 x monthly drinks wine  . Drug Use: No  . Sexual Activity: No   Other Topics Concern  . Not on file   Social History Narrative      Marital status:  Divorced in 2000 after six years of marriage; not dating.      Children:  3 daughters (37, 75, 66); 5 grandchildren.      Lives: alone.  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      Tobacco: none      Alcohol: socially; special occasions.  Rare.      Drugs: none      Exercise:  Sporadic; stationary bike.      Sexual activity:  Not sexually  active since  2011.       Guns:  No guns in the home.       Smoke detectors in use,       Seatbelt:  Sometimes uses seat belts. 75% of time.      Family History  Problem Relation Age of Onset  . Hypertension Mother 78  . Osteoarthritis Mother   . Colon cancer Mother   . Diabetes Mother   . Cancer Mother 48    Colon cancer  . Colon cancer Paternal Grandmother   . Cancer Paternal Grandmother   . Colon cancer      uncle/aunt  . Stroke Maternal Grandmother   . Colon polyps Brother   . Diabetes Daughter   . Hypertension Daughter   . Sleep apnea Daughter   . Mental illness Daughter     depression  . Migraines Daughter   . Colon polyps Brother   . Cirrhosis Brother     alcoholism  . Migraines Daughter   . Mental illness Daughter     anxiety attacks  . Protein S deficiency Daughter     BP 110/78 mmHg  Pulse 99  Ht 5' (1.524 m)  Wt 220 lb (99.791 kg)  BMI 42.97 kg/m2  Review of Systems: See HPI above.    Objective:  Physical Exam:  Gen: NAD  Right foot/ankle: Mild swelling, bruising lateral foot.  No other deformity. Did not test ROM with known fracture. TTP base 5th metatarsal and some dorsal foot as well. Negative ant drawer and talar tilt.   Negative syndesmotic compression. Thompsons test negative. NV intact distally.    Assessment & Plan:  1. Right 5th metatarsal avulsion fracture - should take 6-8 weeks to heal.  Cam walker, can bear weight as tolerated.  Icing, elevation.  Crutches as needed.  F/u in 2 weeks for reevaluation, repeat radiographs.

## 2015-03-22 NOTE — Assessment & Plan Note (Signed)
Right 5th metatarsal avulsion fracture - should take 6-8 weeks to heal.  Cam walker, can bear weight as tolerated.  Icing, elevation.  Crutches as needed.  F/u in 2 weeks for reevaluation, repeat radiographs.

## 2015-03-22 NOTE — Addendum Note (Signed)
Addended by: Mancel Bale on: 03/22/2015 06:21 PM   Modules accepted: Orders

## 2015-04-04 ENCOUNTER — Ambulatory Visit (HOSPITAL_BASED_OUTPATIENT_CLINIC_OR_DEPARTMENT_OTHER)
Admission: RE | Admit: 2015-04-04 | Discharge: 2015-04-04 | Disposition: A | Discharge status: Home / Self Care | Payer: 59 | Source: Ambulatory Visit | Attending: Family Medicine | Admitting: Family Medicine

## 2015-04-04 ENCOUNTER — Ambulatory Visit (INDEPENDENT_AMBULATORY_CARE_PROVIDER_SITE_OTHER): Payer: 59 | Admitting: Family Medicine

## 2015-04-04 DIAGNOSIS — S92351A Displaced fracture of fifth metatarsal bone, right foot, initial encounter for closed fracture: Secondary | ICD-10-CM

## 2015-04-04 DIAGNOSIS — S92351D Displaced fracture of fifth metatarsal bone, right foot, subsequent encounter for fracture with routine healing: Secondary | ICD-10-CM | POA: Diagnosis present

## 2015-04-04 DIAGNOSIS — W109XXA Fall (on) (from) unspecified stairs and steps, initial encounter: Secondary | ICD-10-CM | POA: Diagnosis not present

## 2015-04-05 ENCOUNTER — Ambulatory Visit (INDEPENDENT_AMBULATORY_CARE_PROVIDER_SITE_OTHER): Payer: Self-pay | Admitting: Family Medicine

## 2015-04-05 ENCOUNTER — Ambulatory Visit: Payer: 59 | Admitting: Pharmacist

## 2015-04-05 VITALS — BP 106/92 | Ht 60.0 in | Wt 215.0 lb

## 2015-04-05 DIAGNOSIS — E119 Type 2 diabetes mellitus without complications: Secondary | ICD-10-CM

## 2015-04-05 NOTE — Patient Instructions (Addendum)
1)  Continue to test regularly 2)  Continue to make healthy dietary choices and attempt to reduce soda 3)  Attempt to resume mild, low-resistance exercise on stationary bike and increase as tolerated by foot 4) Move Invokana to bedtime to help reduce fasting glucose 5)  Follow-up in August on Wednesday August 24th @ 4:15 pm  Great to see you today!

## 2015-04-05 NOTE — Progress Notes (Signed)
Subjective:  Patient is a 54 yo female with type 2 diabetes who presents today for 3 month follow-up as part of the employer-sponsored [facility] program. Current diabetes regimen includes Invokana, glipizide ER, metformin, and Januvia . Patient also continues on daily ASA, ACEi, and statin. Most recent MD follow-up was March 2016. Patient has a pending appt for June 2016. No major health changes at this time.  Diabetes Assessment:  Recent addition of Invokana to diabetes regimen due to elevated A1c at March appt. Patient reports good medication compliance, but she does forget a dose occasionally.  She is tolerating regimen well, but does report recent symptoms of UTI/yeast infection.  I have recommended she notify her MD immediately for an antibiotic.  Most recent A1c was 9.0% (March 2016) which is exceeding goal of less than 7%.  Hopefully patient will see reduced A1c at next visit given addition of Invokana and recent weight loss.  Weight has decreased by 15 lb since last visit.  Patient did not bring meter today but is currently testing 1-2 times per day.  Pt reports fasting generally 150-160 and mid-day glucose less than this.  Hypoglycemia is occasional and patient reports it being a result of weight loss.  She corrects with candy or crackers, we have reviewed appropriate correction.  Patient denies signs and symptoms of neuropathy including numbness/tingling/burning and symptoms of foot infection.  Patient is up to date on eye and dental exam.     I have suggested pt move Invokana to bedtime to help reduce fasting glucose.  I have also asked her to speak with MD regarding possible reduction of glipizide due to recent hypoglycemia.     Lifestyle Assessment:  Exercising - Pt broke foot two weeks ago and has not exercised since then, but prior to this pt was exercising 3-5 days per week for 30 min each on stationary bike at home.  She will wear foot boot for four additional weeks (6 total).   She believes she can resume using stationary bike at a low resistance and for short time even while wearing the boot.   Of note, patient is considering a gastric sleeve surgery.    Diet -  Pt has made efforts to limit fast food, and when eating fast food she has attempted to make healthier choices such as grilled chicken.  Unfortunately she has resumed drinking soda (1-2 regular sodas per day), but will make an effort to reduce this.     Plan and Goals: 1)  Continue to test regularly 2)  Continue to make healthy dietary choices and attempt to reduce soda 3)  Attempt to resume mild, low-resistance exercise on stationary bike and increase as tolerated by foot 4) Move Invokana to bedtime to help reduce fasting glucose 5)  Follow-up in August on Wednesday August 24th @ 4:15 pm   Tilman Neat, PharmD [facility]  [phone]

## 2015-04-05 NOTE — Progress Notes (Signed)
Patient arrived 45 minutes late to appointment and was not seen - was sent for x-rays and rescheduled.

## 2015-04-07 ENCOUNTER — Encounter: Payer: Self-pay | Admitting: Family Medicine

## 2015-04-07 ENCOUNTER — Encounter: Payer: Self-pay | Admitting: Pharmacist

## 2015-04-07 ENCOUNTER — Telehealth: Payer: Self-pay

## 2015-04-07 NOTE — Telephone Encounter (Signed)
Patient is calling because she would like a prescription for her UTI sent to [facility]. Patient phone [phone]

## 2015-04-10 NOTE — Telephone Encounter (Signed)
She needs office visit for UTI. I have called to advise (she has already been sent my chart message regarding this)

## 2015-04-11 ENCOUNTER — Encounter: Payer: Self-pay | Admitting: Family Medicine

## 2015-04-11 ENCOUNTER — Ambulatory Visit (INDEPENDENT_AMBULATORY_CARE_PROVIDER_SITE_OTHER): Payer: 59 | Admitting: Family Medicine

## 2015-04-11 VITALS — BP 106/72 | HR 86 | Ht 60.0 in | Wt 215.0 lb

## 2015-04-11 DIAGNOSIS — S92351A Displaced fracture of fifth metatarsal bone, right foot, initial encounter for closed fracture: Secondary | ICD-10-CM

## 2015-04-12 ENCOUNTER — Encounter: Payer: Self-pay | Admitting: Family Medicine

## 2015-04-12 ENCOUNTER — Ambulatory Visit (INDEPENDENT_AMBULATORY_CARE_PROVIDER_SITE_OTHER): Payer: 59 | Admitting: Family Medicine

## 2015-04-12 VITALS — BP 126/84 | HR 89 | Temp 98.4°F | Resp 16 | Ht 61.0 in | Wt 216.8 lb

## 2015-04-12 DIAGNOSIS — I10 Essential (primary) hypertension: Secondary | ICD-10-CM

## 2015-04-12 DIAGNOSIS — K219 Gastro-esophageal reflux disease without esophagitis: Secondary | ICD-10-CM | POA: Diagnosis not present

## 2015-04-12 DIAGNOSIS — F418 Other specified anxiety disorders: Secondary | ICD-10-CM | POA: Diagnosis not present

## 2015-04-12 DIAGNOSIS — E669 Obesity, unspecified: Secondary | ICD-10-CM

## 2015-04-12 DIAGNOSIS — R300 Dysuria: Secondary | ICD-10-CM | POA: Diagnosis not present

## 2015-04-12 DIAGNOSIS — E119 Type 2 diabetes mellitus without complications: Secondary | ICD-10-CM | POA: Diagnosis not present

## 2015-04-12 DIAGNOSIS — F419 Anxiety disorder, unspecified: Secondary | ICD-10-CM

## 2015-04-12 DIAGNOSIS — Z1331 Encounter for screening for depression: Secondary | ICD-10-CM

## 2015-04-12 DIAGNOSIS — Z1389 Encounter for screening for other disorder: Secondary | ICD-10-CM | POA: Diagnosis not present

## 2015-04-12 DIAGNOSIS — E7800 Pure hypercholesterolemia, unspecified: Secondary | ICD-10-CM

## 2015-04-12 DIAGNOSIS — F329 Major depressive disorder, single episode, unspecified: Secondary | ICD-10-CM

## 2015-04-12 DIAGNOSIS — F32A Depression, unspecified: Secondary | ICD-10-CM

## 2015-04-12 LAB — COMPREHENSIVE METABOLIC PANEL
ALBUMIN: 3.9 g/dL (ref 3.5–5.2)
ALT: 39 U/L — ABNORMAL HIGH (ref 0–35)
AST: 40 U/L — ABNORMAL HIGH (ref 0–37)
Alkaline Phosphatase: 77 U/L (ref 39–117)
BUN: 17 mg/dL (ref 6–23)
CHLORIDE: 102 meq/L (ref 96–112)
CO2: 24 mEq/L (ref 19–32)
CREATININE: 0.71 mg/dL (ref 0.50–1.10)
Calcium: 9.6 mg/dL (ref 8.4–10.5)
GLUCOSE: 132 mg/dL — AB (ref 70–99)
Potassium: 4.5 mEq/L (ref 3.5–5.3)
Sodium: 138 mEq/L (ref 135–145)
Total Bilirubin: 0.4 mg/dL (ref 0.2–1.2)
Total Protein: 7.5 g/dL (ref 6.0–8.3)

## 2015-04-12 LAB — HEMOGLOBIN A1C
Hgb A1c MFr Bld: 7 % — ABNORMAL HIGH (ref <5.7)
Mean Plasma Glucose: 154 mg/dL — ABNORMAL HIGH (ref <117)

## 2015-04-12 LAB — CBC WITH DIFFERENTIAL/PLATELET
BASOS ABS: 0 10*3/uL (ref 0.0–0.1)
BASOS PCT: 0 % (ref 0–1)
EOS ABS: 0.3 10*3/uL (ref 0.0–0.7)
Eosinophils Relative: 2 % (ref 0–5)
HCT: 40.2 % (ref 36.0–46.0)
Hemoglobin: 13.2 g/dL (ref 12.0–15.0)
LYMPHS ABS: 4.8 10*3/uL — AB (ref 0.7–4.0)
LYMPHS PCT: 31 % (ref 12–46)
MCH: 26.7 pg (ref 26.0–34.0)
MCHC: 32.8 g/dL (ref 30.0–36.0)
MCV: 81.4 fL (ref 78.0–100.0)
MONOS PCT: 5 % (ref 3–12)
MPV: 9.8 fL (ref 8.6–12.4)
Monocytes Absolute: 0.8 10*3/uL (ref 0.1–1.0)
NEUTROS PCT: 62 % (ref 43–77)
Neutro Abs: 9.6 10*3/uL — ABNORMAL HIGH (ref 1.7–7.7)
Platelets: 368 10*3/uL (ref 150–400)
RBC: 4.94 MIL/uL (ref 3.87–5.11)
RDW: 15.9 % — AB (ref 11.5–15.5)
WBC: 15.5 10*3/uL — AB (ref 4.0–10.5)

## 2015-04-12 LAB — POCT URINALYSIS DIPSTICK
Bilirubin, UA: NEGATIVE
Glucose, UA: 1000
KETONES UA: NEGATIVE
Nitrite, UA: NEGATIVE
PROTEIN UA: NEGATIVE
SPEC GRAV UA: 1.015
Urobilinogen, UA: 0.2
pH, UA: 5

## 2015-04-12 LAB — LIPID PANEL
CHOLESTEROL: 182 mg/dL (ref 0–200)
HDL: 42 mg/dL — AB (ref >=46)
LDL Cholesterol: 107 mg/dL — ABNORMAL HIGH (ref 0–99)
Total CHOL/HDL Ratio: 4.3 Ratio
Triglycerides: 167 mg/dL — ABNORMAL HIGH (ref <150)
VLDL: 33 mg/dL (ref 0–40)

## 2015-04-12 LAB — POCT UA - MICROSCOPIC ONLY
CASTS, UR, LPF, POC: NEGATIVE
Crystals, Ur, HPF, POC: NEGATIVE
MUCUS UA: POSITIVE
RBC, urine, microscopic: NEGATIVE
YEAST UA: NEGATIVE

## 2015-04-12 NOTE — Patient Instructions (Signed)
DASH Eating Plan °DASH stands for "Dietary Approaches to Stop Hypertension." The DASH eating plan is a healthy eating plan that has been shown to reduce high blood pressure (hypertension). Additional health benefits may include reducing the risk of type 2 diabetes mellitus, heart disease, and stroke. The DASH eating plan may also help with weight loss. °WHAT DO I NEED TO KNOW ABOUT THE DASH EATING PLAN? °For the DASH eating plan, you will follow these general guidelines: °· Choose foods with a percent daily value for sodium of less than 5% (as listed on the food label). °· Use salt-free seasonings or herbs instead of table salt or sea salt. °· Check with your health care provider or pharmacist before using salt substitutes. °· Eat lower-sodium products, often labeled as "lower sodium" or "no salt added." °· Eat fresh foods. °· Eat more vegetables, fruits, and low-fat dairy products. °· Choose whole grains. Look for the word "whole" as the first word in the ingredient list. °· Choose fish and skinless chicken or turkey more often than red meat. Limit fish, poultry, and meat to 6 oz (170 g) each day. °· Limit sweets, desserts, sugars, and sugary drinks. °· Choose heart-healthy fats. °· Limit cheese to 1 oz (28 g) per day. °· Eat more home-cooked food and less restaurant, buffet, and fast food. °· Limit fried foods. °· Cook foods using methods other than frying. °· Limit canned vegetables. If you do use them, rinse them well to decrease the sodium. °· When eating at a restaurant, ask that your food be prepared with less salt, or no salt if possible. °WHAT FOODS CAN I EAT? °Seek help from a dietitian for individual calorie needs. °Grains °Whole grain or whole wheat bread. Brown rice. Whole grain or whole wheat pasta. Quinoa, bulgur, and whole grain cereals. Low-sodium cereals. Corn or whole wheat flour tortillas. Whole grain cornbread. Whole grain crackers. Low-sodium crackers. °Vegetables °Fresh or frozen vegetables  (raw, steamed, roasted, or grilled). Low-sodium or reduced-sodium tomato and vegetable juices. Low-sodium or reduced-sodium tomato sauce and paste. Low-sodium or reduced-sodium canned vegetables.  °Fruits °All fresh, canned (in natural juice), or frozen fruits. °Meat and Other Protein Products °Ground beef (85% or leaner), grass-fed beef, or beef trimmed of fat. Skinless chicken or turkey. Ground chicken or turkey. Pork trimmed of fat. All fish and seafood. Eggs. Dried beans, peas, or lentils. Unsalted nuts and seeds. Unsalted canned beans. °Dairy °Low-fat dairy products, such as skim or 1% milk, 2% or reduced-fat cheeses, low-fat ricotta or cottage cheese, or plain low-fat yogurt. Low-sodium or reduced-sodium cheeses. °Fats and Oils °Tub margarines without trans fats. Light or reduced-fat mayonnaise and salad dressings (reduced sodium). Avocado. Safflower, olive, or canola oils. Natural peanut or almond butter. °Other °Unsalted popcorn and pretzels. °The items listed above may not be a complete list of recommended foods or beverages. Contact your dietitian for more options. °WHAT FOODS ARE NOT RECOMMENDED? °Grains °White bread. White pasta. White rice. Refined cornbread. Bagels and croissants. Crackers that contain trans fat. °Vegetables °Creamed or fried vegetables. Vegetables in a cheese sauce. Regular canned vegetables. Regular canned tomato sauce and paste. Regular tomato and vegetable juices. °Fruits °Dried fruits. Canned fruit in light or heavy syrup. Fruit juice. °Meat and Other Protein Products °Fatty cuts of meat. Ribs, chicken wings, bacon, sausage, bologna, salami, chitterlings, fatback, hot dogs, bratwurst, and packaged luncheon meats. Salted nuts and seeds. Canned beans with salt. °Dairy °Whole or 2% milk, cream, half-and-half, and cream cheese. Whole-fat or sweetened yogurt. Full-fat   cheeses or blue cheese. Nondairy creamers and whipped toppings. Processed cheese, cheese spreads, or cheese  curds. °Condiments °Onion and garlic salt, seasoned salt, table salt, and sea salt. Canned and packaged gravies. Worcestershire sauce. Tartar sauce. Barbecue sauce. Teriyaki sauce. Soy sauce, including reduced sodium. Steak sauce. Fish sauce. Oyster sauce. Cocktail sauce. Horseradish. Ketchup and mustard. Meat flavorings and tenderizers. Bouillon cubes. Hot sauce. Tabasco sauce. Marinades. Taco seasonings. Relishes. °Fats and Oils °Butter, stick margarine, lard, shortening, ghee, and bacon fat. Coconut, palm kernel, or palm oils. Regular salad dressings. °Other °Pickles and olives. Salted popcorn and pretzels. °The items listed above may not be a complete list of foods and beverages to avoid. Contact your dietitian for more information. °WHERE CAN I FIND MORE INFORMATION? °National Heart, Lung, and [employer] Institute: www.nhlbi.nih.gov/health/health-topics/topics/dash/ °Document Released: 10/17/2011 Document Revised: 03/14/2014 Document Reviewed: 09/01/2013 °[employer]® Patient Information ©2015 [employer]. This information is not intended to replace advice given to you by your health care provider. Make sure you discuss any questions you have with your health care provider. ° °

## 2015-04-12 NOTE — Progress Notes (Signed)
Subjective:    Patient ID: Cristina [name], female    DOB: [birth year], 54 y.o.   MRN: [id number]  04/12/2015  Follow-up and Depression   HPI This 54 y.o. female presents for three month follow-up:   1.  Obesity:  Stopped attending bariatric clinic.  No visits with bariatric clinic in May.  Continues to lose weight.  Unable to exercise for three weeks.  Not sure when can return to exercise.  Total weight loss 26 pounds.  Making better food choices.  No hamburgers; sodas are weakness.    2.  Foot fracture fifth metatarsal fracture: tripped in a hole.  Wearing CAM walker; s/p visit with [facility] yesterday; follow-up with Nori Riis in 05/08/15.    3.  DMII: sugars running 126-107.  Patient reports good compliance with medication, good tolerance to medication, and good symptom control.  Highest reading is 140s.  Had some lows; gets weak and sick.  Checking sugars once or twice daily. Last eye exam this month; [facility].     4.  Hyperlipidemia:Patient reports good compliance with medication, good tolerance to medication, and good symptom control.  Denies HA/dizziness/focal weakness/paresthesias.  5. HTN:  Not checking BP at home. Patient reports good compliance with medication, good tolerance to medication, and good symptom control.  Denies CP/palp/SOB/leg swelling.  6.  Anxiety and depression: having one bad day per week. Patient reports good compliance with medication, good tolerance to medication, and good symptom control.  +SI intermittently; contemplates overdose or if ran off the road.  Not ready to die.  Not interested in psychotherapy or psychiatry consultation.  7. GERD: better with weight loss.  Does suffer with nausea in morning.  Spicy foods aggravate.  Denies vomiting, diarrhea, bloody stools, or melena.  8. Dysuria:  Burning, itching, pain on L.  Has OTC antifungal but has not used it yet.     Review of Systems  Constitutional: Negative for fever, chills, diaphoresis and fatigue.  Eyes:  Negative for visual disturbance.  Respiratory: Negative for cough and shortness of breath.   Cardiovascular: Negative for chest pain, palpitations and leg swelling.  Gastrointestinal: Positive for nausea. Negative for vomiting, abdominal pain, diarrhea, constipation, blood in stool, abdominal distention, anal bleeding and rectal pain.  Endocrine: Negative for cold intolerance, heat intolerance, polydipsia, polyphagia and polyuria.  Genitourinary: Positive for dysuria, genital sores and vaginal pain. Negative for urgency, frequency, hematuria, flank pain, vaginal discharge and pelvic pain.  Skin: Negative for color change, pallor, rash and wound.  Neurological: Negative for dizziness, tremors, seizures, syncope, facial asymmetry, speech difficulty, weakness, light-headedness, numbness and headaches.  Psychiatric/Behavioral: Positive for suicidal ideas, sleep disturbance and dysphoric mood. Negative for self-injury. The patient is not nervous/anxious.     Past Medical History  Diagnosis Date  . Hemorrhoid   . Fissure, anal   . Fibroids     uterine  . Cystocele   . Rectocele   . Diabetes mellitus   . Genital herpes   . Arthritis   . Depression   . Hyperlipidemia   . Obesity   . GERD (gastroesophageal reflux disease)   . CTS (carpal tunnel syndrome)   . EP (ectopic pregnancy) 1989  . Hx of migraine headaches   . Dysfunction of eustachian tube   . Constipation   . Tobacco use disorder   . Allergic rhinitis, cause unspecified   . Iron deficiency anemia, unspecified   . Insomnia   . OSA on CPAP   . Hypertension    Past  Surgical History  Procedure Laterality Date  . Cholecystectomy    . Carpal tunnel release  1989    Left  . Ectopic pregnancy surgery  1993  . Tubal ligation  1988  . Cholecystectomy    . Sleep study  09/11/2012    severe OSA.  CPAP titration at 12 cm water pressure.  . Abdominal hysterectomy  11/11/2010    [facility] Plan.  Ovaries intact.  Uterine fibroids with DUB.    . Colonoscopy  06/29/2012    normal.  Repeat in 5 years.  . Esophagogastroduodenoscopy  12/13/2011    dysphagia.  Henrene Pastor.  Normal.   Allergies  Allergen Reactions  . Fish Oil Rash   Current Outpatient Prescriptions  Medication Sig Dispense Refill  . acyclovir (ZOVIRAX) 400 MG tablet TAKE 1 TABLET BY MOUTH 2 (TWO) TIMES DAILY. 180 tablet 3  . albuterol (PROAIR HFA) 108 (90 BASE) MCG/ACT inhaler Inhale 2 puffs into the lungs every 6 (six) hours as needed. 18 g 1  . ALPRAZolam (XANAX) 0.5 MG tablet Take 1 tablet (0.5 mg total) by mouth daily as needed for anxiety. 30 tablet 5  . AMBULATORY NON FORMULARY MEDICATION Medication Name: Diltizem cream 2% apply as needed to rectum    . ANUCORT-HC 25 MG suppository PLACE 1 SUPPOSITORY RECTALLY 2 (TWO) TIMES DAILY AS NEEDED FOR HEMORRHOIDS. 25 suppository 5  . aspirin EC 81 MG tablet Take 81 mg by mouth daily.    [name] azelastine (ASTELIN) 137 MCG/SPRAY nasal spray Place 2 sprays into both nostrils 2 (two) times daily. Use in each nostril as directed 30 mL 11  . BIOTIN 5000 PO Take by mouth.    [name] buPROPion (WELLBUTRIN SR) 100 MG 12 hr tablet Take 1 tablet (100 mg total) by mouth daily. 90 tablet 3  . Calcium Carbonate-Vitamin D (CALCIUM + D PO) Take 1 tablet by mouth daily.    . canagliflozin (INVOKANA) 100 MG TABS tablet Take 1 tablet (100 mg total) by mouth daily. 90 tablet 3  . cetirizine (ZYRTEC) 10 MG tablet Take 1 tablet (10 mg total) by mouth daily. 90 tablet 3  . ferrous sulfate 325 (65 FE) MG EC tablet Take 325 mg by mouth daily.     . fluticasone (FLONASE) 50 MCG/ACT nasal spray PLACE 2 SPRAYS INTO BOTH NOSTRILS DAILY. 48 g 2  . glipiZIDE (GLUCOTROL XL) 10 MG 24 hr tablet Take 1 tablet (10 mg total) by mouth daily with breakfast. 90 tablet 3  . Glucosamine HCl 1000 MG TABS Take 1,000 mg by mouth 2 (two) times daily with a meal.     . glucose blood test strip USE TO CHECK BLOOD SUGAR DAILY. DX CODE: 250.00 100 each 3  . hydrocortisone 2.5 %  ointment Apply topically 2 (two) times daily. 30 g 0  . lisinopril (PRINIVIL,ZESTRIL) 5 MG tablet Take 1 tablet (5 mg total) by mouth daily. 90 tablet 3  . metFORMIN (GLUCOPHAGE) 500 MG tablet Take 2 tablets (1,000 mg total) by mouth 2 (two) times daily with a meal. 360 tablet 4  . montelukast (SINGULAIR) 10 MG tablet TAKE 1 TABLET (10 MG TOTAL) BY MOUTH AT BEDTIME. 90 tablet 3  . Multiple Vitamin (MULTIVITAMIN) capsule Take 1 capsule by mouth daily.    . pantoprazole (PROTONIX) 40 MG tablet Take 1 tablet (40 mg total) by mouth 2 (two) times daily. 180 tablet 1  . rOPINIRole (REQUIP) 1 MG tablet Take 1 tablet (1 mg total) by mouth at bedtime. 90 tablet 3  .  senna (SENOKOT) 8.6 MG tablet Take 1 tablet by mouth daily.    . sertraline (ZOLOFT) 100 MG tablet Take 1.5 tablets (150 mg total) by mouth daily. (Patient taking differently: Take 100 mg by mouth daily. ) 135 tablet 3  . simvastatin (ZOCOR) 40 MG tablet Take 1 tablet (40 mg total) by mouth daily at 6 PM. 90 tablet 3  . sitaGLIPtin (JANUVIA) 100 MG tablet TAKE 1 TABLET BY MOUTH ONCE DAILY  NEEDS F/U FOR REFILLS 90 tablet 3  . terconazole (TERAZOL 7) 0.4 % vaginal cream Place 1 applicator vaginally at bedtime. 45 g 1  . TRUEPLUS LANCETS 33G MISC USE TO CHECK BLOOD GLUCOSE DAILY AS DIRECTED 100 each 3  . zolpidem (AMBIEN) 5 MG tablet Take 1 tablet (5 mg total) by mouth at bedtime. 30 tablet 5  . ibuprofen (ADVIL,MOTRIN) 800 MG tablet TAKE 1 TABLET BY MOUTH EVERY 8 HOURS AS NEEDED. 40 tablet PRN   No current facility-administered medications for this visit.   History   Social History  . Marital Status: Single    Spouse Name: n/a  . Number of Children: 3  . Years of Education: college   Occupational History  . NURSE SECRETARY     [employer]   Social History Main Topics  . Smoking status: Former Research scientist (life sciences)  . Smokeless tobacco: Never Used     Comment: smoked occasionally longest 6 mos.  . Alcohol Use: 0.6 oz/week    1 Glasses of  wine per week     Comment: 2 x monthly drinks wine  . Drug Use: No  . Sexual Activity: No   Other Topics Concern  . Not on file   Social History Narrative      Marital status:  Divorced in 2000 after six years of marriage; not dating.      Children:  3 daughters (37, 24, 35); 5 grandchildren.      Lives: alone.  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      Tobacco: none      Alcohol: socially; special occasions.  Rare.      Drugs: none      Exercise:  Sporadic; stationary bike.      Sexual activity:  Not sexually active since 2011.       Guns:  No guns in the home.       Smoke detectors in use,       Seatbelt:  Sometimes uses seat belts. 75% of time.         Objective:    BP 126/84 mmHg  Pulse 89  Temp(Src) 98.4 F (36.9 C) (Oral)  Resp 16  Ht _0  (1.549 m)  Wt 216 lb 12.8 oz (98.34 kg)  BMI 40.99 kg/m2  SpO2 98% Physical Exam  Constitutional: She is oriented to person, place, and time. She appears well-developed and well-nourished. No distress.  obese  HENT:  Head: Normocephalic and atraumatic.  Right Ear: External ear normal.  Left Ear: External ear normal.  Nose: Nose normal.  Mouth/Throat: Oropharynx is clear and moist.  Eyes: Conjunctivae and EOM are normal. Pupils are equal, round, and reactive to light.  Neck: Normal range of motion. Neck supple. Carotid bruit is not present. No thyromegaly present.  Cardiovascular: Normal rate, regular rhythm, normal heart sounds and intact distal pulses.  Exam reveals no gallop and no friction rub.   No murmur heard. Pulmonary/Chest: Effort normal and breath  sounds normal. She has no wheezes. She has no rales.  Abdominal: Soft. Bowel sounds are normal. She exhibits no distension and no mass. There is no tenderness. There is no rebound and no guarding.  Lymphadenopathy:    She has no cervical adenopathy.  Neurological: She is alert and oriented to person, place, and  time. No cranial nerve deficit.  Skin: Skin is warm and dry. No rash noted. She is not diaphoretic. No erythema. No pallor.  Psychiatric: She has a normal mood and affect. Her behavior is normal.   Results for orders placed or performed in visit on 04/12/15  Urine culture  Result Value Ref Range   Colony Count 20,OOO COLONIES/ML    Organism ID, Bacteria Multiple bacterial morphotypes present, none    Organism ID, Bacteria predominant. Suggest appropriate recollection if     Organism ID, Bacteria clinically indicated.   CBC with Differential/Platelet  Result Value Ref Range   WBC 15.5 (H) 4.0 - 10.5 K/uL   RBC 4.94 3.87 - 5.11 MIL/uL   Hemoglobin 13.2 12.0 - 15.0 g/dL   HCT 40.2 36.0 - 46.0 %   MCV 81.4 78.0 - 100.0 fL   MCH 26.7 26.0 - 34.0 pg   MCHC 32.8 30.0 - 36.0 g/dL   RDW 15.9 (H) 11.5 - 15.5 %   Platelets 368 150 - 400 K/uL   MPV 9.8 8.6 - 12.4 fL   Neutrophils Relative % 62 43 - 77 %   Neutro Abs 9.6 (H) 1.7 - 7.7 K/uL   Lymphocytes Relative 31 12 - 46 %   Lymphs Abs 4.8 (H) 0.7 - 4.0 K/uL   Monocytes Relative 5 3 - 12 %   Monocytes Absolute 0.8 0.1 - 1.0 K/uL   Eosinophils Relative 2 0 - 5 %   Eosinophils Absolute 0.3 0.0 - 0.7 K/uL   Basophils Relative 0 0 - 1 %   Basophils Absolute 0.0 0.0 - 0.1 K/uL   Smear Review Criteria for review not met   Comprehensive metabolic panel  Result Value Ref Range   Sodium 138 135 - 145 mEq/L   Potassium 4.5 3.5 - 5.3 mEq/L   Chloride 102 96 - 112 mEq/L   CO2 24 19 - 32 mEq/L   Glucose, Bld 132 (H) 70 - 99 mg/dL   BUN 17 6 - 23 mg/dL   Creat 0.71 0.50 - 1.10 mg/dL   Total Bilirubin 0.4 0.2 - 1.2 mg/dL   Alkaline Phosphatase 77 39 - 117 U/L   AST 40 (H) 0 - 37 U/L   ALT 39 (H) 0 - 35 U/L   Total Protein 7.5 6.0 - 8.3 g/dL   Albumin 3.9 3.5 - 5.2 g/dL   Calcium 9.6 8.4 - 10.5 mg/dL  Hemoglobin A1c  Result Value Ref Range   Hgb A1c MFr Bld 7.0 (H) <5.7 %   Mean Plasma Glucose 154 (H) <117 mg/dL  Lipid panel  Result Value  Ref Range   Cholesterol 182 0 - 200 mg/dL   Triglycerides 167 (H) <150 mg/dL   HDL 42 (L) >=46 mg/dL   Total CHOL/HDL Ratio 4.3 Ratio   VLDL 33 0 - 40 mg/dL   LDL Cholesterol 107 (H) 0 - 99 mg/dL  POCT urinalysis dipstick  Result Value Ref Range   Color, UA yellow    Clarity, UA turbid    Glucose, UA 1000    Bilirubin, UA neg    Ketones, UA neg    Spec Grav, UA  1.015    Blood, UA trace    pH, UA 5.0    Protein, UA neg    Urobilinogen, UA 0.2    Nitrite, UA neg    Leukocytes, UA small (1+)   POCT UA - Microscopic Only  Result Value Ref Range   WBC, Ur, HPF, POC tntc    RBC, urine, microscopic neg    Bacteria, U Microscopic 2+    Mucus, UA pos    Epithelial cells, urine per micros 0-4    Crystals, Ur, HPF, POC neg    Casts, Ur, LPF, POC neg    Yeast, UA neg        Assessment & Plan:   1. Pure hypercholesterolemia   2. Type 2 diabetes mellitus without complication   3. Depression screen   4. Dysuria   5. Essential hypertension   6. Anxiety and depression   7. Gastroesophageal reflux disease without esophagitis   8. Obesity    1. Hypercholesterolemia: controlled; obtain labs; continue current medications. 2.  DMII: uncontrolled; 26 pound weight loss recently at bariatric clinic; obtain labs.  3.  Dysuria: New. Urine culture negative; consistent with candidiasis as etiology to dysuria; recommend starting OTC antifungal topical cream.   4.  HTN: controlled; obtain labs; continue Lisinopril. 5.  Anxiety and depression: moderately controlled; intermittent SI yet no attempt; contracted safety; declined referral to psychotherapy or psychiatry; if SI persists, ill insist on referrals. 6.  GERD: moderately controlled; continue PPI; continue to work on aggressive weight loss. 7.  Obesity: 26 pound weight loss; congratulations; working with bariatric clinic.   No orders of the defined types were placed in this encounter.    Return in about 3 months (around 07/13/2015) for  recheck.   Yaslene Lindamood Elayne Guerin, M.D. [facility] [address] [place], [place]  [address] [phone] phone [phone] fax

## 2015-04-13 NOTE — Progress Notes (Signed)
ATTENDING PHYSICIAN NOTE: I have reviewed the chart and agree with the plan as detailed above. Kyrillos Adams MD Pager [phone]  

## 2015-04-13 NOTE — Assessment & Plan Note (Signed)
Right 5th metatarsal avulsion fracture - 3-4 weeks out at this point.  Continue with cam walker, weight bear as tolerated.  She is going to follow-up in about 4 weeks in [place] as it's difficult for her to get here to [place].  [place], elevation.

## 2015-04-13 NOTE — Progress Notes (Signed)
PCP: SMITH,KRISTI, MD  Subjective:   HPI: Patient is a 54 y.o. female here for right ankle injury.  5/10: Patient reports on 5/7 she was going out the door when she twisted her right foot and fell to the ground. Pain lateral right foot with swelling, bruising. Difficulty bearing weight after this. Pain level up to 8/10 now. Taking tylenol. Radiographs showed avulsion fracture of 5th metatarsal. Wearing cam walker.  5/31: Patient returns to have foot reevaluated following metatarsal avulsion. Still with pain at fracture site and radiating some up lateral ankle. Radiographs with slight retraction as is typical with this type of fracture. Wearing cam walker regularly.  Past Medical History  Diagnosis Date  . Hemorrhoid   . Fissure, anal   . Fibroids     uterine  . Cystocele   . Rectocele   . Diabetes mellitus   . Genital herpes   . Arthritis   . Depression   . Hyperlipidemia   . Obesity   . GERD (gastroesophageal reflux disease)   . CTS (carpal tunnel syndrome)   . EP (ectopic pregnancy) 1989  . Hx of migraine headaches   . Dysfunction of eustachian tube   . Constipation   . Tobacco use disorder   . Allergic rhinitis, cause unspecified   . Iron deficiency anemia, unspecified   . Insomnia   . OSA on CPAP   . Hypertension     Current Outpatient Prescriptions on File Prior to Visit  Medication Sig Dispense Refill  . acyclovir (ZOVIRAX) 400 MG tablet TAKE 1 TABLET BY MOUTH 2 (TWO) TIMES DAILY. 180 tablet 3  . albuterol (PROAIR HFA) 108 (90 BASE) MCG/ACT inhaler Inhale 2 puffs into the lungs every 6 (six) hours as needed. 18 g 1  . ALPRAZolam (XANAX) 0.5 MG tablet Take 1 tablet (0.5 mg total) by mouth daily as needed for anxiety. 30 tablet 5  . AMBULATORY NON FORMULARY MEDICATION Medication Name: Diltizem cream 2% apply as needed to rectum    . ANUCORT-HC 25 MG suppository PLACE 1 SUPPOSITORY RECTALLY 2 (TWO) TIMES DAILY AS NEEDED FOR HEMORRHOIDS. 25 suppository 5  .  aspirin EC 81 MG tablet Take 81 mg by mouth daily.    [name] azelastine (ASTELIN) 137 MCG/SPRAY nasal spray Place 2 sprays into both nostrils 2 (two) times daily. Use in each nostril as directed 30 mL 11  . BIOTIN 5000 PO Take by mouth.    [name] buPROPion (WELLBUTRIN SR) 100 MG 12 hr tablet Take 1 tablet (100 mg total) by mouth daily. 90 tablet 3  . Calcium Carbonate-Vitamin D (CALCIUM + D PO) Take 1 tablet by mouth daily.    . canagliflozin (INVOKANA) 100 MG TABS tablet Take 1 tablet (100 mg total) by mouth daily. 90 tablet 3  . cetirizine (ZYRTEC) 10 MG tablet Take 1 tablet (10 mg total) by mouth daily. 90 tablet 3  . ferrous sulfate 325 (65 FE) MG EC tablet Take 325 mg by mouth daily.     . fluticasone (FLONASE) 50 MCG/ACT nasal spray PLACE 2 SPRAYS INTO BOTH NOSTRILS DAILY. 48 g 2  . glipiZIDE (GLUCOTROL XL) 10 MG 24 hr tablet Take 1 tablet (10 mg total) by mouth daily with breakfast. 90 tablet 3  . Glucosamine HCl 1000 MG TABS Take 1,000 mg by mouth 2 (two) times daily with a meal.     . glucose blood test strip USE TO CHECK BLOOD SUGAR DAILY. DX CODE: 250.00 100 each 3  . hydrocortisone 2.5 %  ointment Apply topically 2 (two) times daily. 30 g 0  . ibuprofen (ADVIL,MOTRIN) 800 MG tablet TAKE 1 TABLET BY MOUTH EVERY 8 HOURS AS NEEDED 40 tablet 4  . lisinopril (PRINIVIL,ZESTRIL) 5 MG tablet Take 1 tablet (5 mg total) by mouth daily. 90 tablet 3  . metFORMIN (GLUCOPHAGE) 500 MG tablet Take 2 tablets (1,000 mg total) by mouth 2 (two) times daily with a meal. 360 tablet 4  . montelukast (SINGULAIR) 10 MG tablet TAKE 1 TABLET (10 MG TOTAL) BY MOUTH AT BEDTIME. 90 tablet 3  . Multiple Vitamin (MULTIVITAMIN) capsule Take 1 capsule by mouth daily.    . pantoprazole (PROTONIX) 40 MG tablet Take 1 tablet (40 mg total) by mouth 2 (two) times daily. 180 tablet 1  . rOPINIRole (REQUIP) 1 MG tablet Take 1 tablet (1 mg total) by mouth at bedtime. 90 tablet 3  . senna (SENOKOT) 8.6 MG tablet Take 1 tablet by mouth  daily.    . sertraline (ZOLOFT) 100 MG tablet Take 1.5 tablets (150 mg total) by mouth daily. (Patient taking differently: Take 100 mg by mouth daily. ) 135 tablet 3  . simvastatin (ZOCOR) 40 MG tablet Take 1 tablet (40 mg total) by mouth daily at 6 PM. 90 tablet 3  . sitaGLIPtin (JANUVIA) 100 MG tablet TAKE 1 TABLET BY MOUTH ONCE DAILY  NEEDS F/U FOR REFILLS 90 tablet 3  . terconazole (TERAZOL 7) 0.4 % vaginal cream Place 1 applicator vaginally at bedtime. 45 g 1  . TRUEPLUS LANCETS 33G MISC USE TO CHECK BLOOD GLUCOSE DAILY AS DIRECTED 100 each 3  . zolpidem (AMBIEN) 5 MG tablet Take 1 tablet (5 mg total) by mouth at bedtime. 30 tablet 5   No current facility-administered medications on file prior to visit.    Past Surgical History  Procedure Laterality Date  . Cholecystectomy    . Carpal tunnel release  1989    Left  . Ectopic pregnancy surgery  1993  . Tubal ligation  1988  . Cholecystectomy    . Sleep study  09/11/2012    severe OSA.  CPAP titration at 12 cm water pressure.  . Abdominal hysterectomy  11/11/2010    [facility] Plan.  Ovaries intact.  Uterine fibroids with DUB.  . Colonoscopy  06/29/2012    normal.  Repeat in 5 years.  . Esophagogastroduodenoscopy  12/13/2011    dysphagia.  Henrene Pastor.  Normal.    Allergies  Allergen Reactions  . Fish Oil Rash    History   Social History  . Marital Status: Single    Spouse Name: n/a  . Number of Children: 3  . Years of Education: college   Occupational History  . NURSE SECRETARY     [employer]   Social History Main Topics  . Smoking status: Former Research scientist (life sciences)  . Smokeless tobacco: Never Used     Comment: smoked occasionally longest 6 mos.  . Alcohol Use: 0.6 oz/week    1 Glasses of wine per week     Comment: 2 x monthly drinks wine  . Drug Use: No  . Sexual Activity: No   Other Topics Concern  . Not on file   Social History Narrative      Marital status:  Divorced in 2000 after six years of marriage; not dating.       Children:  3 daughters (37, 78, 40); 5 grandchildren.      Lives: alone.  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      Tobacco: none      Alcohol: socially; special occasions.  Rare.      Drugs: none      Exercise:  Sporadic; stationary bike.      Sexual activity:  Not sexually active since 2011.       Guns:  No guns in the home.       Smoke detectors in use,       Seatbelt:  Sometimes uses seat belts. 75% of time.      Family History  Problem Relation Age of Onset  . Hypertension Mother 8  . Osteoarthritis Mother   . Colon cancer Mother   . Diabetes Mother   . Cancer Mother 78    Colon cancer  . Colon cancer Paternal Grandmother   . Cancer Paternal Grandmother   . Colon cancer      uncle/aunt  . Stroke Maternal Grandmother   . Colon polyps Brother   . Diabetes Daughter   . Hypertension Daughter   . Sleep apnea Daughter   . Mental illness Daughter     depression  . Migraines Daughter   . Colon polyps Brother   . Cirrhosis Brother     alcoholism  . Migraines Daughter   . Mental illness Daughter     anxiety attacks  . Protein S deficiency Daughter     BP 106/72 mmHg  Pulse 86  Ht 5' (1.524 m)  Wt 215 lb (97.523 kg)  BMI 41.99 kg/m2  Review of Systems: See HPI above.    Objective:  Physical Exam:  Gen: NAD  Right foot/ankle: Mild swelling, no bruising lateral foot.  No other deformity. Did not test ROM with known fracture. TTP base 5th metatarsal and some dorsal foot as well. Negative ant drawer and talar tilt.   Thompsons test negative. NV intact distally.    Assessment & Plan:  1. Right 5th metatarsal avulsion fracture - 3-4 weeks out at this point.  Continue with cam walker, weight bear as tolerated.  She is going to follow-up in about 4 weeks in [place] as it's difficult for her to get here to [employer] [place].  [place], elevation.

## 2015-04-14 LAB — URINE CULTURE

## 2015-04-17 ENCOUNTER — Other Ambulatory Visit: Payer: Self-pay | Admitting: Family Medicine

## 2015-04-18 ENCOUNTER — Encounter: Payer: Self-pay | Admitting: Pharmacist

## 2015-04-21 ENCOUNTER — Encounter: Payer: Self-pay | Admitting: Family Medicine

## 2015-04-25 ENCOUNTER — Encounter: Payer: Self-pay | Admitting: Family Medicine

## 2015-05-03 ENCOUNTER — Other Ambulatory Visit: Payer: Self-pay | Admitting: Family Medicine

## 2015-05-03 ENCOUNTER — Encounter: Payer: Self-pay | Admitting: Family Medicine

## 2015-05-03 NOTE — Telephone Encounter (Signed)
Patient called to follow up on refill request

## 2015-05-04 MED ORDER — PHENTERMINE HCL 37.5 MG PO CAPS: 37.5000 mg | ORAL_CAPSULE | ORAL | Status: DC

## 2015-05-04 MED ORDER — ROPINIROLE HCL 1 MG PO TABS
ORAL_TABLET | ORAL | Status: DC
Start: 2015-05-04 — End: 2016-05-04

## 2015-05-08 ENCOUNTER — Ambulatory Visit (INDEPENDENT_AMBULATORY_CARE_PROVIDER_SITE_OTHER): Payer: 59 | Admitting: Family Medicine

## 2015-05-08 ENCOUNTER — Ambulatory Visit: Payer: 59 | Admitting: Family Medicine

## 2015-05-08 ENCOUNTER — Encounter: Payer: Self-pay | Admitting: Family Medicine

## 2015-05-08 VITALS — BP 106/70 | Ht 61.0 in | Wt 219.0 lb

## 2015-05-08 DIAGNOSIS — S92351A Displaced fracture of fifth metatarsal bone, right foot, initial encounter for closed fracture: Secondary | ICD-10-CM | POA: Diagnosis not present

## 2015-05-09 NOTE — Progress Notes (Signed)
   Subjective:    Patient ID: Cristina [name], female    DOB: 1961/06/03, 54 y.o.   MRN: [id number]  HPI DATE OF INJURY: 03/18/2015 Tripped and twisted foot. Seen at [facility]--xray shoed avulsion fx 5th Mt Placed in cam walker boot and has been wearing since then Pain improved Anxious to come out of boot    Review of Systems No fever, no unusual warmth or new swelling of foot. No new symptoms, pain is decreased but still 1-3/10 after she has been on it all day.    Objective:   Physical Exam Vitals reviewed WD WN NAD FOOT: RIGHT; ttp mildly over 5th  [place]. 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 F/u 3 weeks She is worried about non union--will get films prior to visit but I did discuss that's ometimes this does not 'fill in" totally on x ray even tho it is 'healed'.

## 2015-05-29 ENCOUNTER — Ambulatory Visit: Payer: 59 | Admitting: Family Medicine

## 2015-05-31 ENCOUNTER — Other Ambulatory Visit: Payer: Self-pay | Admitting: Family Medicine

## 2015-05-31 ENCOUNTER — Encounter: Payer: Self-pay | Admitting: Family Medicine

## 2015-06-01 MED ORDER — BUPROPION HCL ER (SR) 100 MG PO TB12: 100.0000 mg | ORAL_TABLET | Freq: Every day | ORAL | Status: DC

## 2015-06-01 NOTE — Telephone Encounter (Signed)
Please call in or fax in refill of Alprazolam.

## 2015-06-01 NOTE — Telephone Encounter (Signed)
Refill of Wellbutrin sent to pharmacy.

## 2015-06-02 ENCOUNTER — Ambulatory Visit (INDEPENDENT_AMBULATORY_CARE_PROVIDER_SITE_OTHER): Payer: 59 | Admitting: Urgent Care

## 2015-06-02 ENCOUNTER — Ambulatory Visit (INDEPENDENT_AMBULATORY_CARE_PROVIDER_SITE_OTHER): Payer: 59

## 2015-06-02 VITALS — BP 124/82 | HR 103 | Temp 98.6°F | Resp 18 | Ht 60.0 in | Wt 220.0 lb

## 2015-06-02 DIAGNOSIS — M25512 Pain in left shoulder: Secondary | ICD-10-CM

## 2015-06-02 MED ORDER — PREDNISONE 10 MG PO TABS: 10.0000 mg | ORAL_TABLET | Freq: Every day | ORAL | Status: DC

## 2015-06-02 NOTE — Telephone Encounter (Signed)
Faxed

## 2015-06-02 NOTE — Progress Notes (Signed)
MRN: [id number] DOB: 09/16/1961  Subjective:   Cristina [name] is a 54 y.o. female presenting for chief complaint of Shoulder Pain  Reports 3 day history of left shoulder pain. Pain is constant, aching nature with intermittent sharp pains, worse with use of her left arm. Patient also reports associated neck tightness on the left side and over trapezius. Denies any trauma but does admit that she went kayaking on Wednesday. However, her shoulder pain started Tuesday and was worsened after kayaking. 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 Denies fevers, swelling, redness, hearing popping noises, bony deformity, previous shoulder injury or surgery, previous neck injury or surgery, numbness, tingling. She has tried Meloxicam once and doesn't know if it helped. Denies any other aggravating or relieving factors, no other questions or concerns.  Cristina [name] has a current medication list which includes the following prescription(s): acyclovir, albuterol, alprazolam, AMBULATORY NON FORMULARY MEDICATION, anucort-hc, aspirin ec, azelastine, biotin, bupropion, calcium citrate-vitamin d, canagliflozin, cetirizine, ferrous sulfate, fluticasone, glipizide, glucosamine hcl, glucose blood, hydrocortisone, ibuprofen, lisinopril, metformin, montelukast, multivitamin, pantoprazole, phentermine, ropinirole, senna, sertraline, simvastatin, sitagliptin, terconazole, trueplus lancets 33g, and zolpidem. She is allergic to fish oil.  Cristina [name]  has a past medical history of Hemorrhoid; Fissure, anal; Fibroids; Cystocele; Rectocele; Diabetes mellitus; Genital herpes; Arthritis; Depression; Hyperlipidemia; Obesity; GERD (gastroesophageal reflux disease); CTS (carpal tunnel syndrome); EP (ectopic pregnancy) (1989); migraine headaches; Dysfunction of eustachian tube; Constipation; Tobacco use disorder; Allergic rhinitis, cause unspecified; Iron deficiency anemia, unspecified; Insomnia; OSA on CPAP; and  Hypertension. Also  has past surgical history that includes Cholecystectomy; Carpal tunnel release (1989); Ectopic pregnancy surgery (1993); Tubal ligation (1988); Cholecystectomy; Sleep study (09/11/2012); Abdominal hysterectomy (11/11/2010); Colonoscopy (06/29/2012); and Esophagogastroduodenoscopy (12/13/2011).  ROS As in subjective.  Objective:   Vitals: BP 124/82 mmHg  Pulse 103  Temp(Src) 98.6 F (37 C)  Resp 18  Ht 5' (1.524 m)  Wt 220 lb (99.791 kg)  BMI 42.97 kg/m2  SpO2 98%   HR 92 on recheck by PA-Hyla Coard.  Physical Exam  Constitutional: She is oriented to person, place, and time. She appears well-developed and well-nourished.  Cardiovascular: Normal rate.   Pulmonary/Chest: Effort normal.  Musculoskeletal:       Left shoulder: She exhibits decreased range of motion (slightly decreased active ROM, full passive ROM) and tenderness (with ROM testing, exquisite tenderness with very light palpation, POOP). She exhibits no bony tenderness, no swelling, no effusion, no crepitus, no deformity, no laceration, no pain, no spasm and normal strength.       Cervical back: She exhibits normal range of motion, no tenderness, no bony tenderness, no swelling, no edema, no deformity, no laceration and no pain.  Neurological: She is alert and oriented to person, place, and time.  Skin: Skin is warm and dry. No rash noted. No erythema. No pallor.  Psychiatric: She has a normal mood and affect.   [facility] reading (PRIMARY) by  Dr. Joseph Art and PA-Jontue Crumpacker. Left shoulder - 2 areas of calcification over left AC joint, lateral humeral head, no fracture or dislocation.  Dg Shoulder Left  06/02/2015   CLINICAL DATA:  Acute left shoulder pain after kayaking. Initial encounter.  EXAM: LEFT SHOULDER - 2+ VIEW  COMPARISON:  None.  FINDINGS: There is no evidence of fracture or dislocation. Focal calcification is seen over greater tuberosity of proximal left humerus suggesting calcific tendonitis. Soft tissues are  unremarkable.  IMPRESSION: No acute abnormality seen. 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   Electronically Signed   By: Marijo Conception, M.D.   On: 06/02/2015 11:30   Assessment and Plan :   1. Left shoulder pain - Unclear etiology, I offered Meloxicam but patient has this, will start a low dose steroid course to help in addition. Patient has an appointment with [facility] in 1 week. I advised that she keep this appointment to have her shoulder evaluated. Patient is to call me if she cannot make this appointment, I will refer to Dr. Rip Harbour at that point.  Jaynee Eagles, PA-C [facility] [phone] 06/02/2015 10:32 AM

## 2015-06-02 NOTE — Patient Instructions (Addendum)

## 2015-06-05 ENCOUNTER — Other Ambulatory Visit: Payer: Self-pay | Admitting: Family Medicine

## 2015-06-05 ENCOUNTER — Encounter: Payer: Self-pay | Admitting: *Deleted

## 2015-06-07 ENCOUNTER — Ambulatory Visit
Admission: RE | Admit: 2015-06-07 | Discharge: 2015-06-07 | Disposition: A | Discharge status: Home / Self Care | Payer: 59 | Source: Ambulatory Visit | Attending: Family Medicine | Admitting: Family Medicine

## 2015-06-07 DIAGNOSIS — S92351A Displaced fracture of fifth metatarsal bone, right foot, initial encounter for closed fracture: Secondary | ICD-10-CM

## 2015-06-09 ENCOUNTER — Encounter: Payer: Self-pay | Admitting: Family Medicine

## 2015-06-09 ENCOUNTER — Ambulatory Visit (INDEPENDENT_AMBULATORY_CARE_PROVIDER_SITE_OTHER): Payer: 59 | Admitting: Family Medicine

## 2015-06-09 VITALS — BP 127/57 | Ht 61.0 in | Wt 216.0 lb

## 2015-06-09 DIAGNOSIS — M7741 Metatarsalgia, right foot: Secondary | ICD-10-CM | POA: Diagnosis not present

## 2015-06-09 DIAGNOSIS — S92351D Displaced fracture of fifth metatarsal bone, right foot, subsequent encounter for fracture with routine healing: Secondary | ICD-10-CM | POA: Diagnosis not present

## 2015-06-09 DIAGNOSIS — M7742 Metatarsalgia, left foot: Secondary | ICD-10-CM | POA: Diagnosis not present

## 2015-06-11 ENCOUNTER — Telehealth: Payer: Self-pay | Admitting: Family Medicine

## 2015-06-11 NOTE — Progress Notes (Signed)
   Subjective:    Patient ID: Cristina [name], female    DOB: 01/19/1961, 54 y.o.   MRN: [id number]  HPI  F/u right foot avulsion fracture. Doing much better. Still has some pain if she stands more than 8 hours a day but it resolves with  Rest. Doing full activities in normal shoes. She did get repeat x rays.  Review of Systems No new symptoms.     Objective:   Physical Exam WD WN overweight NAD FOOT RIGHt. Essentially no ttp over proximal 5th MT. No bruising. Plantar flexion / heel raise is painless. ANKLE:Normal eversion and inversion        Assessment & Plan:  Avulsion fracture right  5 proximal MT--clinically healed I reviewed her x ray today and explained it may never "look normal: due to fibrrous union, but clinically she is healed. I also gave her an arch strap to try on days she is on her feet long hours to see if this might help complete the healing process with less pain. She has not really done her exercises so I suggested she might  Want to do those for a few weeks to make sure her ankle is back to full function. F/u PRN.

## 2015-06-11 NOTE — Telephone Encounter (Signed)
lmom of patient new appt date with dr Tamala Julian 07-21-15 @11 :15

## 2015-06-26 ENCOUNTER — Other Ambulatory Visit: Payer: Self-pay | Admitting: Family Medicine

## 2015-07-05 ENCOUNTER — Ambulatory Visit (INDEPENDENT_AMBULATORY_CARE_PROVIDER_SITE_OTHER): Payer: 59 | Admitting: Family Medicine

## 2015-07-05 ENCOUNTER — Ambulatory Visit: Payer: 59 | Admitting: Pharmacist

## 2015-07-05 VITALS — BP 110/68 | Wt 217.0 lb

## 2015-07-05 DIAGNOSIS — E118 Type 2 diabetes mellitus with unspecified complications: Secondary | ICD-10-CM

## 2015-07-06 ENCOUNTER — Ambulatory Visit: Payer: Self-pay | Admitting: Family Medicine

## 2015-07-06 NOTE — Progress Notes (Signed)
Subjective:  Patient is a 54 yo female with type 2 diabetes who presents today for 3 month follow-up as part of the employer-sponsored [facility] program. Current diabetes regimen includes Invokana, glipizide ER, metformin, and Januvia . Patient also continues on daily ASA, ACEi, and statin. Most recent MD follow-up was June 2016. Patient has a pending appt for Aug 2016. No major health changes at this time.  Diabetes Assessment:  Patient reports good medication compliance, but she does forget a dose occasionally.  Most recent A1c was >8% (June 2016) which is exceeding goal of less than 7%, according to patient .  Weight has remained unchanged since last visit.  Patient did bring meter today and is currently testing 1-2 times per day.  She uses [employer] and keeps one meter at work and one at home.  Fastings have improved and are generally 95-140.  Hypoglycemia is occasional and is most often occuring at work, mid morning, as low as 50-60s.  She corrects with candy or crackers, we have reviewed appropriate correction.  Patient denies signs and symptoms of neuropathy including numbness/tingling/burning and symptoms of foot infection.  Patient is up to date on eye and dental exam.      Lifestyle Assessment:  Exercising - Pt fractured foot in May and this impaired her ability to exercise.  She has not been back on track since.  Foot has healed appropriately and pt has been released for normal activity and exercise.  Pt reports intermittent pain in foot, but admits that she could exercise and needs to get back on track.    Of note, patient is still considering a gastric sleeve surgery.    Diet -  Pt has made efforts to limit fast food, and when eating fast food she has attempted to make healthier choices such as grilled chicken.  She continues to reduce soda and now drinks mostly diet drinks and water. She has reduced portions sizes, but as a result she sometimes skips meals altogether.  She always  eats a breakfast, but sometimes skips lunch and/or supper, replacing it with only a small snack.  I have encouraged her to always have some type of protein along with her snack to reduce hypoglycemia.  Plan and Goals: 1)  Continue to test regularly 2)  Continue to make healthy dietary choices and attempt to eat a protein along with snacks to reduce hypoglycemia 3)  Attempt to resume mild, low-resistance exercise on stationary bike at least twice weekly 5)  Follow-up in 3 months on Wednesday November 30th @ 4:15 pm  Great to see you today!    Tilman Neat, PharmD [facility]  [phone]

## 2015-07-06 NOTE — Patient Instructions (Signed)
1)  Continue to test regularly 2)  Continue to make healthy dietary choices and attempt to eat a protein along with snacks to reduce hypoglycemia 3)  Attempt to resume mild, low-resistance exercise on stationary bike at least twice weekly 5)  Follow-up in 3 months on Wednesday November 30th @ 4:15 pm  Great to see you today!

## 2015-07-10 ENCOUNTER — Other Ambulatory Visit: Payer: Self-pay

## 2015-07-10 MED ORDER — SERTRALINE HCL 100 MG PO TABS: 150.0000 mg | ORAL_TABLET | Freq: Every day | ORAL | Status: DC

## 2015-07-19 ENCOUNTER — Ambulatory Visit: Payer: 59 | Admitting: Family Medicine

## 2015-07-19 NOTE — Progress Notes (Signed)
ATTENDING PHYSICIAN NOTE: I have reviewed the chart and agree with the plan as detailed above. Odeal Welden MD Pager [phone]  

## 2015-07-21 ENCOUNTER — Ambulatory Visit (INDEPENDENT_AMBULATORY_CARE_PROVIDER_SITE_OTHER): Payer: 59 | Admitting: Family Medicine

## 2015-07-21 ENCOUNTER — Encounter: Payer: Self-pay | Admitting: Family Medicine

## 2015-07-21 VITALS — BP 116/80 | HR 81 | Temp 98.5°F | Resp 16 | Wt 215.6 lb

## 2015-07-21 DIAGNOSIS — E119 Type 2 diabetes mellitus without complications: Secondary | ICD-10-CM

## 2015-07-21 DIAGNOSIS — E669 Obesity, unspecified: Secondary | ICD-10-CM | POA: Diagnosis not present

## 2015-07-21 DIAGNOSIS — G2581 Restless legs syndrome: Secondary | ICD-10-CM

## 2015-07-21 DIAGNOSIS — M797 Fibromyalgia: Secondary | ICD-10-CM | POA: Diagnosis not present

## 2015-07-21 DIAGNOSIS — E785 Hyperlipidemia, unspecified: Secondary | ICD-10-CM | POA: Diagnosis not present

## 2015-07-21 DIAGNOSIS — I10 Essential (primary) hypertension: Secondary | ICD-10-CM

## 2015-07-21 LAB — CBC WITH DIFFERENTIAL/PLATELET
BASOS ABS: 0 10*3/uL (ref 0.0–0.1)
Basophils Relative: 0 % (ref 0–1)
EOS ABS: 0.3 10*3/uL (ref 0.0–0.7)
EOS PCT: 2 % (ref 0–5)
HCT: 41.7 % (ref 36.0–46.0)
Hemoglobin: 13.5 g/dL (ref 12.0–15.0)
Lymphocytes Relative: 36 % (ref 12–46)
Lymphs Abs: 4.5 10*3/uL — ABNORMAL HIGH (ref 0.7–4.0)
MCH: 26.2 pg (ref 26.0–34.0)
MCHC: 32.4 g/dL (ref 30.0–36.0)
MCV: 80.8 fL (ref 78.0–100.0)
MPV: 10.3 fL (ref 8.6–12.4)
Monocytes Absolute: 0.6 10*3/uL (ref 0.1–1.0)
Monocytes Relative: 5 % (ref 3–12)
Neutro Abs: 7.2 10*3/uL (ref 1.7–7.7)
Neutrophils Relative %: 57 % (ref 43–77)
PLATELETS: 380 10*3/uL (ref 150–400)
RBC: 5.16 MIL/uL — ABNORMAL HIGH (ref 3.87–5.11)
RDW: 15.6 % — AB (ref 11.5–15.5)
WBC: 12.6 10*3/uL — ABNORMAL HIGH (ref 4.0–10.5)

## 2015-07-21 LAB — COMPREHENSIVE METABOLIC PANEL
ALBUMIN: 3.9 g/dL (ref 3.6–5.1)
ALK PHOS: 74 U/L (ref 33–130)
ALT: 30 U/L — AB (ref 6–29)
AST: 25 U/L (ref 10–35)
BUN: 14 mg/dL (ref 7–25)
CALCIUM: 9.5 mg/dL (ref 8.6–10.4)
CO2: 25 mmol/L (ref 20–31)
Chloride: 105 mmol/L (ref 98–110)
Creat: 0.73 mg/dL (ref 0.50–1.05)
Glucose, Bld: 112 mg/dL — ABNORMAL HIGH (ref 65–99)
POTASSIUM: 4.8 mmol/L (ref 3.5–5.3)
Sodium: 140 mmol/L (ref 135–146)
TOTAL PROTEIN: 7.7 g/dL (ref 6.1–8.1)
Total Bilirubin: 0.3 mg/dL (ref 0.2–1.2)

## 2015-07-21 LAB — HEMOGLOBIN A1C
HEMOGLOBIN A1C: 7.4 % — AB (ref <5.7)
Mean Plasma Glucose: 166 mg/dL — ABNORMAL HIGH (ref <117)

## 2015-07-21 LAB — LIPID PANEL
CHOLESTEROL: 170 mg/dL (ref 125–200)
HDL: 44 mg/dL — ABNORMAL LOW (ref >=46)
LDL CALC: 100 mg/dL (ref <130)
TRIGLYCERIDES: 128 mg/dL (ref <150)
Total CHOL/HDL Ratio: 3.9 Ratio (ref <=5.0)
VLDL: 26 mg/dL (ref <30)

## 2015-07-21 MED ORDER — PHENTERMINE HCL 37.5 MG PO CAPS: 37.5000 mg | ORAL_CAPSULE | ORAL | Status: DC

## 2015-07-21 NOTE — Progress Notes (Signed)
Subjective:    Patient ID: Cristina [name], female    DOB: [birth year], 54 y.o.   MRN: [id number]  07/21/2015  Diabetes; Hyperlipidemia; and Hypertension   HPI This 54 y.o. female presents for three month follow-up:  1. DMII:  Patient reports good compliance with medication, good tolerance to medication, and good symptom control.  Fasting sugars 100-125.  Two hours later is when sugar is dropping.  Taking Glucotrol XL 10mg  every morning.  Taking Metformin bid.  Taking Januvia 100mg  every morning; taking Invokana at night with Metformin.   Before bed highest is 120.  Low sugar three days per week.  For breakfast, eating cereal and milk; added protein/cheese/almonds/peanut butter but sugar still dropping.   Has lost 16 pounds.  2. Depression and anxiety: requesting FMLA for depression again; denied at last visit.  Does not want to go to work two days per month on average.  Having two bad days per month; some days calling out and some days pushes through it. No SI/HI.  Coworkers are stressful.  Did not increase Zoloft to 150mg  after last visit due to dry mouth.  Taking Wellbutrin SR 100mg  every morning; having dry mouth all day. Also currently taking Phentermine.     3.  Obesity: weight unchanged from last visit; if has not lost more weight in four months, going to undergo sleeve bariatric procedure.  Has been taking Phentermine 37.5mg  daily.  Suffering with dry mouth.  4. Fibromyalgia: s/p evaluation by Dr. Kathee Delton with diagnosis.  Recommended vitamins without improvement.  Having aches and pains all the time.   No current muscle relaxers.    5. RLS: taking Requip qhs.  Will get a tingling in leg and then will have jerking.  Some intermittent back pain; jerking occurs throughout the day. Can occur while laying on the couch.     Review of Systems  Constitutional: Negative for fever, chills, diaphoresis and fatigue.  HENT: Negative for congestion, ear pain, postnasal drip, rhinorrhea and  sneezing.   Eyes: Negative for visual disturbance.  Respiratory: Negative for cough and shortness of breath.   Cardiovascular: Negative for chest pain, palpitations and leg swelling.  Gastrointestinal: Negative for nausea, vomiting, abdominal pain, diarrhea and constipation.  Endocrine: Negative for cold intolerance, heat intolerance, polydipsia, polyphagia and polyuria.  Musculoskeletal: Positive for myalgias, back pain and arthralgias.  Skin: Negative for color change, pallor, rash and wound.  Neurological: Negative for dizziness, tremors, seizures, syncope, facial asymmetry, speech difficulty, weakness, light-headedness, numbness and headaches.  Psychiatric/Behavioral: Positive for dysphoric mood. Negative for suicidal ideas, sleep disturbance and self-injury. The patient is not nervous/anxious.     Past Medical History  Diagnosis Date  . Hemorrhoid   . Fissure, anal   . Cystocele   . Rectocele   . Diabetes mellitus   . Genital herpes   . Arthritis   . Depression   . Hyperlipidemia   . Obesity   . GERD (gastroesophageal reflux disease)   . CTS (carpal tunnel syndrome)   . Hx of migraine headaches   . Dysfunction of eustachian tube   . Constipation   . Allergic rhinitis, cause unspecified   . Iron deficiency anemia, unspecified   . Insomnia   . OSA on CPAP   . Hypertension    Past Surgical History  Procedure Laterality Date  . Cholecystectomy    . Carpal tunnel release  1989    Left  . Ectopic pregnancy surgery  1993  . Tubal ligation  1988  .  Cholecystectomy    . Sleep study  09/11/2012    severe OSA.  CPAP titration at 12 cm water pressure.  . Abdominal hysterectomy  11/11/2010    [place] Plan.  Ovaries intact.  Uterine fibroids with DUB.  . Colonoscopy  06/29/2012    normal.  Repeat in 5 years.  . Esophagogastroduodenoscopy  12/13/2011    dysphagia.  Henrene Pastor.  Normal.   Allergies  Allergen Reactions  . Fish Oil Rash   Current Outpatient Prescriptions  Medication  Sig Dispense Refill  . acyclovir (ZOVIRAX) 400 MG tablet TAKE 1 TABLET BY MOUTH 2 (TWO) TIMES DAILY. 180 tablet 3  . albuterol (PROAIR HFA) 108 (90 BASE) MCG/ACT inhaler Inhale 2 puffs into the lungs every 6 (six) hours as needed. 18 g 1  . ALPRAZolam (XANAX) 0.5 MG tablet TAKE 1 TABLET BY MOUTH ONCE DAILY AS NEEDED FOR ANXIETY 30 tablet 5  . AMBULATORY NON FORMULARY MEDICATION Medication Name: Diltizem cream 2% apply as needed to rectum    . ANUCORT-HC 25 MG suppository PLACE 1 SUPPOSITORY RECTALLY 2 (TWO) TIMES DAILY AS NEEDED FOR HEMORRHOIDS. 25 suppository 5  . aspirin EC 81 MG tablet Take 81 mg by mouth daily.    [name] azelastine (ASTELIN) 137 MCG/SPRAY nasal spray Place 2 sprays into both nostrils 2 (two) times daily. Use in each nostril as directed 30 mL 11  . BIOTIN 5000 PO Take by mouth.    [name] buPROPion (WELLBUTRIN SR) 100 MG 12 hr tablet Take 1 tablet (100 mg total) by mouth daily. 90 tablet 3  . Calcium Carbonate-Vitamin D (CALCIUM + D PO) Take 1 tablet by mouth daily.    . canagliflozin (INVOKANA) 100 MG TABS tablet Take 1 tablet (100 mg total) by mouth daily. 90 tablet 3  . cetirizine (ZYRTEC) 10 MG tablet Take 1 tablet (10 mg total) by mouth daily. 90 tablet 3  . ferrous sulfate 325 (65 FE) MG EC tablet Take 325 mg by mouth daily.     . fluticasone (FLONASE) 50 MCG/ACT nasal spray PLACE 2 SPRAYS INTO BOTH NOSTRILS DAILY. 48 g 2  . glipiZIDE (GLUCOTROL XL) 10 MG 24 hr tablet Take 1 tablet (10 mg total) by mouth daily with breakfast. 90 tablet 3  . Glucosamine HCl 1000 MG TABS Take 1,000 mg by mouth 2 (two) times daily with a meal.     . hydrocortisone 2.5 % ointment Apply topically 2 (two) times daily. 30 g 0  . ibuprofen (ADVIL,MOTRIN) 800 MG tablet TAKE 1 TABLET BY MOUTH EVERY 8 HOURS AS NEEDED. 40 tablet PRN  . lisinopril (PRINIVIL,ZESTRIL) 5 MG tablet Take 1 tablet (5 mg total) by mouth daily. 90 tablet 3  . metFORMIN (GLUCOPHAGE) 500 MG tablet Take 2 tablets (1,000 mg total) by mouth  2 (two) times daily with a meal. 360 tablet 4  . montelukast (SINGULAIR) 10 MG tablet TAKE 1 TABLET (10 MG TOTAL) BY MOUTH AT BEDTIME. 90 tablet 3  . Multiple Vitamin (MULTIVITAMIN) capsule Take 1 capsule by mouth daily.    . pantoprazole (PROTONIX) 40 MG tablet Take 1 tablet (40 mg total) by mouth 2 (two) times daily. 180 tablet 1  . phentermine 37.5 MG capsule Take 1 capsule (37.5 mg total) by mouth every morning. 30 capsule 3  . rOPINIRole (REQUIP) 1 MG tablet TAKE 1 TABLET BY MOUTH EVERY NIGHT AT BEDTIME. 90 tablet 3  . senna (SENOKOT) 8.6 MG tablet Take 1 tablet by mouth daily.    . sertraline (ZOLOFT) 100  MG tablet Take 1.5 tablets (150 mg total) by mouth daily. 45 tablet 0  . simvastatin (ZOCOR) 40 MG tablet Take 1 tablet (40 mg total) by mouth daily at 6 PM. 90 tablet 3  . sitaGLIPtin (JANUVIA) 100 MG tablet TAKE 1 TABLET BY MOUTH ONCE DAILY  NEEDS F/U FOR REFILLS 90 tablet 3  . terconazole (TERAZOL 7) 0.4 % vaginal cream Place 1 applicator vaginally at bedtime. 45 g 1  . TRUEPLUS LANCETS 30G MISC USE TO CHECK BLOOD GLUCOSE DAILY AS DIRECTED 100 each 3  . TRUETEST TEST test strip USE TO CHECK BLOOD SUGAR DAILY. 100 each 3  . zolpidem (AMBIEN) 5 MG tablet Take 1 tablet (5 mg total) by mouth at bedtime. 30 tablet 5   No current facility-administered medications for this visit.   Social History   Social History  . Marital Status: Single    Spouse Name: n/a  . Number of Children: 3  . Years of Education: college   Occupational History  . NURSE SECRETARY     [employer]   Social History Main Topics  . Smoking status: Former Research scientist (life sciences)  . Smokeless tobacco: Never Used     Comment: smoked occasionally longest 6 mos.  . Alcohol Use: 0.6 oz/week    1 Glasses of wine per week     Comment: 2 x monthly drinks wine  . Drug Use: No  . Sexual Activity: No   Other Topics Concern  . Not on file   Social History Narrative      Marital status:  Divorced in 2000 after six years of  marriage; not dating.      Children:  3 daughters (37, 23, 5); 5 grandchildren.      Lives: alone.  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      Tobacco: none      Alcohol: socially; special occasions.  Rare.      Drugs: none      Exercise:  Sporadic; stationary bike.      Sexual activity:  Not sexually active since 2011.       Guns:  No guns in the home.       Smoke detectors in use,       Seatbelt:  Sometimes uses seat belts. 75% of time.     Family History  Problem Relation Age of Onset  . Hypertension Mother 32  . Osteoarthritis Mother   . Colon cancer Mother   . Diabetes Mother   . Cancer Mother 10    Colon cancer  . Colon cancer Paternal Grandmother   . Cancer Paternal Grandmother   . Colon cancer      uncle/aunt  . Stroke Maternal Grandmother   . Colon polyps Brother   . Diabetes Daughter   . Hypertension Daughter   . Sleep apnea Daughter   . Mental illness Daughter     depression  . Migraines Daughter   . Colon polyps Brother   . Cirrhosis Brother     alcoholism  . Migraines Daughter   . Mental illness Daughter     anxiety attacks  . Protein S deficiency Daughter        Objective:    BP 116/80 mmHg  Pulse 81  Temp(Src) 98.5 F (36.9 C) (Oral)  Resp 16  Wt 215 lb 9.6 oz (97.796 kg) Physical Exam  Constitutional: She is oriented to person, place, and time. She appears  well-developed and well-nourished. No distress.  obese  HENT:  Head: Normocephalic and atraumatic.  Right Ear: External ear normal.  Left Ear: External ear normal.  Nose: Nose normal.  Mouth/Throat: Oropharynx is clear and moist.  Eyes: Conjunctivae and EOM are normal. Pupils are equal, round, and reactive to light.  Neck: Normal range of motion. Neck supple. Carotid bruit is not present. No thyromegaly present.  Cardiovascular: Normal rate, regular rhythm, normal heart sounds and intact distal pulses.  Exam reveals no gallop and  no friction rub.   No murmur heard. Pulmonary/Chest: Effort normal and breath sounds normal. She has no wheezes. She has no rales.  Abdominal: Soft. Bowel sounds are normal. She exhibits no distension and no mass. There is no tenderness. There is no rebound and no guarding.  Lymphadenopathy:    She has no cervical adenopathy.  Neurological: She is alert and oriented to person, place, and time. No cranial nerve deficit.  Skin: Skin is warm and dry. No rash noted. She is not diaphoretic. No erythema. No pallor.  Psychiatric: She has a normal mood and affect. Her behavior is normal.   Results for orders placed or performed in visit on 06/05/15  HM DIABETES EYE EXAM  Result Value Ref Range   HM Diabetic Eye Exam No Retinopathy No Retinopathy       Assessment & Plan:   1. Type 2 diabetes mellitus without complication   2. Hyperlipidemia   3. Essential hypertension   4. Restless leg syndrome   5. Obesity   6. Fibromyalgia     1. DMII: stable; obtain labs; stop Invokana due to expense and due to frequent hypoglycemia.   2.  Hyperlipidemia: uncontrolled; obtain labs; continue current medications. 3.  HTN: controlled; obtain labs; continue current medications. 4.  RLS: controlled at night with Requip qhs. Have myoclonic jerking during the day; consider referral to neurology for further evaluation; pt to consider. 5.  Obesity: improving slowly; refill of Phentermine provided for next four months. Pt plans to undergo gastric sleeve if does not have significant weight loss over next four months; encourage restarting exercise program. 6.  Fibromyalgia: poorly controlled; recommend follow-up with Dr. Kathee Delton to discuss treatment plan.  Can consider Robaxin PRN; also consider adding Lyrica.   Orders Placed This Encounter  Procedures  . CBC with Differential/Platelet  . Comprehensive metabolic panel    Order Specific Question:  Has the patient fasted?    Answer:  Yes  . Hemoglobin A1c  .  Lipid panel    Order Specific Question:  Has the patient fasted?    Answer:  Yes   Meds ordered this encounter  Medications  . phentermine 37.5 MG capsule    Sig: Take 1 capsule (37.5 mg total) by mouth every morning.    Dispense:  30 capsule    Refill:  3    Return in about 3 months (around 10/20/2015) for complete physical examiniation.   Rya Rausch Elayne Guerin, M.D. [facility] [address] [place], [place]  [address] [phone] phone [phone] fax

## 2015-07-28 ENCOUNTER — Telehealth: Payer: Self-pay

## 2015-07-28 NOTE — Telephone Encounter (Signed)
Please advise. See previous message

## 2015-07-28 NOTE — Telephone Encounter (Signed)
Dr. Tamala Julian   Patient states her sugar is still dropping and wants to know what to do.  [phone]

## 2015-07-29 NOTE — Telephone Encounter (Signed)
Did patient switch her Glipizide to evenings with supper instead of taking it every morning with breakfast (I advised this via Mychart with lab results).  Are sugars still dropping around 11:00am?

## 2015-07-31 NOTE — Telephone Encounter (Signed)
Spoke with pt, advised message from Dr. Smith. Pt understood. 

## 2015-08-03 ENCOUNTER — Other Ambulatory Visit: Payer: Self-pay

## 2015-08-03 MED ORDER — ZOLPIDEM TARTRATE 5 MG PO TABS: 5.0000 mg | ORAL_TABLET | Freq: Every day | ORAL | Status: DC

## 2015-08-03 NOTE — Telephone Encounter (Signed)
Pharm req's RF of zolpidem. Pended.

## 2015-08-03 NOTE — Telephone Encounter (Signed)
Please call in RF of Zolpidem/Ambien as approved for six months.

## 2015-08-03 NOTE — Telephone Encounter (Signed)
Called in.

## 2015-08-09 ENCOUNTER — Other Ambulatory Visit: Payer: Self-pay | Admitting: Family Medicine

## 2015-10-11 ENCOUNTER — Ambulatory Visit: Payer: 59 | Admitting: Pharmacist

## 2015-10-15 ENCOUNTER — Ambulatory Visit (INDEPENDENT_AMBULATORY_CARE_PROVIDER_SITE_OTHER): Payer: 59 | Admitting: Emergency Medicine

## 2015-10-15 VITALS — BP 127/82 | HR 98 | Temp 98.6°F | Resp 18 | Ht 60.0 in | Wt 227.6 lb

## 2015-10-15 DIAGNOSIS — J0140 Acute pansinusitis, unspecified: Secondary | ICD-10-CM | POA: Diagnosis not present

## 2015-10-15 DIAGNOSIS — J209 Acute bronchitis, unspecified: Secondary | ICD-10-CM | POA: Diagnosis not present

## 2015-10-15 MED ORDER — PSEUDOEPHEDRINE-GUAIFENESIN ER 60-600 MG PO TB12: 1.0000 | ORAL_TABLET | Freq: Two times a day (BID) | ORAL | Status: DC

## 2015-10-15 MED ORDER — HYDROCOD POLST-CPM POLST ER 10-8 MG/5ML PO SUER: 5.0000 mL | Freq: Two times a day (BID) | ORAL | Status: DC

## 2015-10-15 MED ORDER — AMOXICILLIN-POT CLAVULANATE 875-125 MG PO TABS: 1.0000 | ORAL_TABLET | Freq: Two times a day (BID) | ORAL | Status: DC

## 2015-10-15 NOTE — Progress Notes (Signed)
Subjective:  Patient ID: Cristina [name], female    DOB: [birth year]  Age: 54 y.o. MRN: [id number]  CC: Headache and Sinusitis   HPI Cristina [name] presents   She's been ill since she says since Thanksgiving with nasal congestion nasal discharge is purulent collar. She hasn de.nshoat. Sh rsse ichees and foreheas ha ctha'sproduciveof uopn sputum.sas o wheigor shortnssfhnrlnonvoinsl haShdprvent with over-te-counter medication.  History Cristina [name] has a past medical history of Hemorrhoid; Fissure, anal; Cystocele; Rectocele; Diabetes mellitus; Genital herpes; Arthritis; Depression; Hyperlipidemia; Obesity; GERD (gastroesophageal reflux disease); CTS (carpal tunnel syndrome); migraine headaches; Dysfunction of eustachian tube; Constipation; Allergic rhinitis, cause unspecified; Iron deficiency anemia, unspecified; Insomnia; OSA on CPAP; and Hypertension.   She has past surgical history that includes Cholecystectomy; Carpal tunnel release (1989); Ectopic pregnancy surgery (1993); Tubal ligation (1988); Cholecystectomy; Sleep study (09/11/2012); Abdominal hysterectomy (11/11/2010); Colonoscopy (06/29/2012); and Esophagogastroduodenoscopy (12/13/2011).   Her  family history includes Cancer in her paternal grandmother; Cancer (age of onset: 48) in her mother; Cirrhosis in her brother; Colon cancer in her mother, paternal grandmother, and another family member; Colon polyps in her brother and brother; Diabetes in her daughter and mother; Hypertension in her daughter; Hypertension (age of onset: 55) in her mother; Mental illness in her daughter and daughter; Migraines in her daughter and daughter; Osteoarthritis in her mother; Protein S deficiency in her daughter; Sleep apnea in her daughter; Stroke in her maternal grandmother.  She   reports that she has quit smoking. She has never used smokeless tobacco. She reports that she drinks about 0.6 oz of alcohol per week. She reports that she does not use illicit  drugs.  Outpatient Prescriptions Prior to Visit  Medication Sig Dispense Refill  . acyclovir (ZOVIRAX) 400 MG tablet TAKE 1 TABLET BY MOUTH 2 (TWO) TIMES DAILY. 180 tablet 3  . albuterol (PROAIR HFA) 108 (90 BASE) MCG/ACT inhaler Inhale 2 puffs into the lungs every 6 (six) hours as needed. 18 g 1  . ALPRAZolam (XANAX) 0.5 MG tablet TAKE 1 TABLET BY MOUTH ONCE DAILY AS NEEDED FOR ANXIETY 30 tablet 5  . AMBULATORY NON FORMULARY MEDICATION Medication Name: Diltizem cream 2% apply as needed to rectum    . ANUCORT-HC 25 MG suppository PLACE 1 SUPPOSITORY RECTALLY 2 (TWO) TIMES DAILY AS NEEDED FOR HEMORRHOIDS. 25 suppository 5  . aspirin EC 81 MG tablet Take 81 mg by mouth daily.    [name] azelastine (ASTELIN) 137 MCG/SPRAY nasal spray Place 2 sprays into both nostrils 2 (two) times daily. Use in each nostril as directed 30 mL 11  . BIOTIN 5000 PO Take by mouth.    [name] buPROPion (WELLBUTRIN SR) 100 MG 12 hr tablet Take 1 tablet (100 mg total) by mouth daily. 90 tablet 3  . Calcium Carbonate-Vitamin D (CALCIUM + D PO) Take 1 tablet by mouth daily.    . canagliflozin (INVOKANA) 100 MG TABS tablet Take 1 tablet (100 mg total) by mouth daily. 90 tablet 3  . cetirizine (ZYRTEC) 10 MG tablet Take 1 tablet (10 mg total) by mouth daily. 90 tablet 3  . ferrous sulfate 325 (65 FE) MG EC tablet Take 325 mg by mouth daily.     . fluticasone (FLONASE) 50 MCG/ACT nasal spray PLACE 2 SPRAYS INTO BOTH NOSTRILS DAILY. 48 g 2  . glipiZIDE (GLUCOTROL XL) 10 MG 24 hr tablet Take 1 tablet (10 mg total) by mouth daily with breakfast. 90 tablet 3  . Glucosamine HCl 1000 MG TABS Take 1,000 mg  by mouth 2 (two) times daily with a meal.     . hydrocortisone 2.5 % ointment Apply topically 2 (two) times daily. 30 g 0  . ibuprofen (ADVIL,MOTRIN) 800 MG tablet TAKE 1 TABLET BY MOUTH EVERY 8 HOURS AS NEEDED. 40 tablet PRN  . lisinopril (PRINIVIL,ZESTRIL) 5 MG tablet Take 1 tablet (5 mg total) by mouth daily. 90 tablet 3  . metFORMIN  (GLUCOPHAGE) 500 MG tablet Take 2 tablets (1,000 mg total) by mouth 2 (two) times daily with a meal. 360 tablet 4  . montelukast (SINGULAIR) 10 MG tablet TAKE 1 TABLET (10 MG TOTAL) BY MOUTH AT BEDTIME. 90 tablet 3  . Multiple Vitamin (MULTIVITAMIN) capsule Take 1 capsule by mouth daily.    . pantoprazole (PROTONIX) 40 MG tablet Take 1 tablet (40 mg total) by mouth 2 (two) times daily. 180 tablet 1  . phentermine 37.5 MG capsule Take 1 capsule (37.5 mg total) by mouth every morning. 30 capsule 3  . rOPINIRole (REQUIP) 1 MG tablet TAKE 1 TABLET BY MOUTH EVERY NIGHT AT BEDTIME. 90 tablet 3  . senna (SENOKOT) 8.6 MG tablet Take 1 tablet by mouth daily.    . sertraline (ZOLOFT) 100 MG tablet TAKE 1 & 1/2 TABLET BY MOUTH DAILY 45 tablet 5  . simvastatin (ZOCOR) 40 MG tablet Take 1 tablet (40 mg total) by mouth daily at 6 PM. 90 tablet 3  . sitaGLIPtin (JANUVIA) 100 MG tablet TAKE 1 TABLET BY MOUTH ONCE DAILY  NEEDS F/U FOR REFILLS 90 tablet 3  . terconazole (TERAZOL 7) 0.4 % vaginal cream Place 1 applicator vaginally at bedtime. 45 g 1  . TRUEPLUS LANCETS 30G MISC USE TO CHECK BLOOD GLUCOSE DAILY AS DIRECTED 100 each 3  . TRUETEST TEST test strip USE TO CHECK BLOOD SUGAR DAILY. 100 each 3  . zolpidem (AMBIEN) 5 MG tablet Take 1 tablet (5 mg total) by mouth at bedtime. 30 tablet 5   No facility-administered medications prior to visit.    Social History   Social History  . Marital Status: Single    Spouse Name: n/a  . Number of Children: 3  . Years of Education: college   Occupational History  . NURSE SECRETARY     [employer]   Social History Main Topics  . Smoking status: Former Research scientist (life sciences)  . Smokeless tobacco: Never Used     Comment: smoked occasionally longest 6 mos.  . Alcohol Use: 0.6 oz/week    1 Glasses of wine per week     Comment: 2 x monthly drinks wine  . Drug Use: No  . Sexual Activity: No   Other Topics Concern  . None   Social History Narrative      Marital  status:  Divorced in 2000 after six years of marriage; not dating.      Children:  3 daughters (37, 23, 52); 5 grandchildren.      Lives: alone.  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      Tobacco: none      Alcohol: socially; special occasions.  Rare.      Drugs: none      Exercise:  Sporadic; stationary bike.      Sexual activity:  Not sexually active since 2011.       Guns:  No guns in the home.       Smoke detectors in use,       Seatbelt:  Sometimes uses seat belts. 75% of time.       Review of Systems  Constitutional: Negative for fever, chills and appetite change.  HENT: Positive for congestion, postnasal drip, rhinorrhea, sinus pressure and sore throat. Negative for ear pain.   Eyes: Negative for pain and redness.  Respiratory: Positive for cough. Negative for shortness of breath and wheezing.   Cardiovascular: Negative for leg swelling.  Gastrointestinal: Negative for nausea, vomiting, abdominal pain, diarrhea, constipation and blood in stool.  Endocrine: Negative for polyuria.  Genitourinary: Negative for dysuria, urgency, frequency and flank pain.  Musculoskeletal: Negative for gait problem.  Skin: Negative for rash.  Neurological: Negative for weakness and headaches.  Psychiatric/Behavioral: Negative for confusion and decreased concentration. The patient is not nervous/anxious.     Objective:  BP 127/82 mmHg  Pulse 98  Temp(Src) 98.6 F (37 C) (Oral)  Resp 18  Ht 5' (1.524 m)  Wt 227 lb 9.6 oz (103.239 kg)  BMI 44.45 kg/m2  SpO2 97%  Physical Exam  Constitutional: She is oriented to person, place, and time. She appears well-developed and well-nourished. No distress.  HENT:  Head: Normocephalic and atraumatic.  Right Ear: External ear normal.  Left Ear: External ear normal.  Nose: Nose normal.  Eyes: Conjunctivae and EOM are normal. Pupils are equal, round, and reactive to light. No scleral icterus.   Neck: Normal range of motion. Neck supple. No tracheal deviation present.  Cardiovascular: Normal rate, regular rhythm and normal heart sounds.   Pulmonary/Chest: Effort normal. No respiratory distress. She has no wheezes. She has no rales.  Abdominal: She exhibits no mass. There is no tenderness. There is no rebound and no guarding.  Musculoskeletal: She exhibits no edema.  Lymphadenopathy:    She has no cervical adenopathy.  Neurological: She is alert and oriented to person, place, and time. Coordination normal.  Skin: Skin is warm and dry. No rash noted.  Psychiatric: She has a normal mood and affect. Her behavior is normal.      Assessment & Plan:   Cristina [name] was seen today for headache and sinusitis.  Diagnoses and all orders for this visit:  Acute bronchitis, unspecified organism  Acute pansinusitis, recurrence not specified  Other orders -     amoxicillin-clavulanate (AUGMENTIN) 875-125 MG tablet; Take 1 tablet by mouth 2 (two) times daily. -     pseudoephedrine-guaifenesin (MUCINEX D) 60-600 MG 12 hr tablet; Take 1 tablet by mouth every 12 (twelve) hours. -     chlorpheniramine-HYDROcodone (TUSSIONEX PENNKINETIC ER) 10-8 MG/5ML SUER; Take 5 mLs by mouth 2 (two) times daily.   I am having Cristina [name] start on amoxicillin-clavulanate, pseudoephedrine-guaifenesin, and chlorpheniramine-HYDROcodone. I am also having her maintain her multivitamin, senna, ferrous sulfate, Calcium Carbonate-Vitamin D (CALCIUM + D PO), Glucosamine HCl, aspirin EC, azelastine, albuterol, cetirizine, AMBULATORY NON FORMULARY MEDICATION, BIOTIN 5000 PO, hydrocortisone, ANUCORT-HC, glipiZIDE, sitaGLIPtin, terconazole, lisinopril, simvastatin, montelukast, acyclovir, canagliflozin, fluticasone, metFORMIN, pantoprazole, ibuprofen, rOPINIRole, buPROPion, ALPRAZolam, TRUETEST TEST, TRUEPLUS LANCETS 30G, phentermine, zolpidem, and sertraline.  Meds ordered this encounter  Medications  . amoxicillin-clavulanate  (AUGMENTIN) 875-125 MG tablet    Sig: Take 1 tablet by mouth 2 (two) times daily.    Dispense:  20 tablet    Refill:  0  . pseudoephedrine-guaifenesin (MUCINEX D) 60-600 MG 12 hr tablet    Sig: Take 1 tablet by mouth every 12 (twelve) hours.    Dispense:  18 tablet    Refill:  0  . chlorpheniramine-HYDROcodone (TUSSIONEX PENNKINETIC ER) 10-8 MG/5ML SUER  Sig: Take 5 mLs by mouth 2 (two) times daily.    Dispense:  60 mL    Refill:  0    Appropriate red flag conditions were discussed with the patient as well as actions that should be taken.  Patient expressed his understanding.  Follow-up: Return if symptoms worsen or fail to improve.  Roselee Culver, MD

## 2015-10-15 NOTE — Patient Instructions (Signed)

## 2015-10-16 ENCOUNTER — Ambulatory Visit: Payer: 59 | Admitting: Family Medicine

## 2015-10-16 ENCOUNTER — Other Ambulatory Visit: Payer: Self-pay | Admitting: Family Medicine

## 2015-10-18 ENCOUNTER — Other Ambulatory Visit: Payer: Self-pay

## 2015-10-18 MED ORDER — FLUCONAZOLE 150 MG PO TABS: 150.0000 mg | ORAL_TABLET | Freq: Once | ORAL | Status: DC

## 2015-10-18 NOTE — Telephone Encounter (Signed)
Dr. Ouida Sills, you saw pt.

## 2015-10-18 NOTE — Telephone Encounter (Signed)
Dr. Tamala Julian,  Needs Diflucan for yeast infection.   Also, face is hurting from the sinus pain.   [place]   [phone]

## 2015-10-19 ENCOUNTER — Telehealth: Payer: Self-pay

## 2015-10-19 NOTE — Telephone Encounter (Signed)
PATIENT STATES SHE HAS A UTI THAT SHE HAS HAD FOR ABOUT 2 TO 3 DAYS. IT HAS GOTTEN WORSE TODAY. SHE WOULD LIKE DR. Tamala Julian TO CALL HER SOMETHING INTO HER PHARMACY. BEST PHONE [phone] (CELL)  PHARMACY CHOICE IS [facility]  [place]

## 2015-10-20 NOTE — Telephone Encounter (Signed)
Left message to RTC.

## 2015-10-26 ENCOUNTER — Encounter: Payer: Self-pay | Admitting: Pharmacist

## 2015-10-26 ENCOUNTER — Ambulatory Visit (INDEPENDENT_AMBULATORY_CARE_PROVIDER_SITE_OTHER): Payer: Self-pay | Admitting: Family Medicine

## 2015-10-26 VITALS — Wt 223.0 lb

## 2015-10-26 DIAGNOSIS — E118 Type 2 diabetes mellitus with unspecified complications: Secondary | ICD-10-CM

## 2015-10-26 NOTE — Patient Instructions (Signed)
1)  Resume testing regularly 2)  Attempt to read nutrition labels and better manage portion control 3) Follow-up in 3 months on Thurs Feb 23rd @ 4:30 pm  Great to see you today!

## 2015-10-26 NOTE — Progress Notes (Signed)
Subjective:  Patient is a 54 yo female with type 2 diabetes who presents today for 3 month follow-up as part of the employer-sponsored [facility] program. Current diabetes regimen includes Invokana, glipizide ER, metformin, and Januvia . Patient also continues on daily ASA, ACEi, and statin. Most recent MD follow-up was Sept 2016. Patient has a pending appt for Dec 2016. No major health changes at this time.  Of note, patient has started taking online classes, and expresses this has been significantly stressful along with other life stressors and this has caused a deterioration in diabetes control.   Diabetes Assessment:  Patient reports good medication compliance, but she does forget a dose occasionally.  Most recent A1c was 7.4% in Sept 2016, which is exceeding goal of less than 7%.  A1c will be repeated at Dec PCP visit later this month.  Weight has increased by 6 lbs since since last visit, pt is very discouraged by this.  She continues taking phentermine, but MD will reassess this med at next appt.  Patient did not bring meter and is testing only on occasion when symptomatic or curious.  She cannot recall the last reading.  She uses [employer] and keeps one meter at work and one at home.  Hypoglycemia is occasional and is most often occuring at work, mid morning, as low as 50-60s.  Pt reports this has improved since moving glipizide to bedtime vs morning.  She corrects with candy or crackers, we have reviewed appropriate correction.  Patient reports some mild signs and symptoms of neuropathy including tingling and strange, sometimes painful sensation on tops of toes, both feet, specifically if she rubs them on something (ex: carpet) or hits her foot against something.  Patient reports MD is aware and she has described this to her.  No signs of infection.  Patient is up to date on eye and dental exam.        Lifestyle Assessment:  Exercising - None at this time due to lack of time as pt  continues working full time, has started online classes, and recently had a new grandchild born.  She does not wish to set an exercise goal at this time.   Diet -  Admits that diet has deteriorated.  She is attempting to limit fast food.  However, she has resumed drinking regular sodas on a regular basis.  Portion sizes are not controlled at this time.  See diet recall:  Breakfast - Honey nut cheerios with milk  Snack - varies Lunch - packs from home or eats at work luncheon, example pizza once this week, baked potato another time this week.   Supper - often skips or has only a small snack due to classwork and studying  We have discussed reading nutrition labels for serving size and carbs and although patient hates reading them, she will make an effort.  She is also considering gastric sleeve surgery and will consider a consult for this.  She cannot afford continuing the weight loss clinic as it is cash pay only and she is not getting results from phentermine at this time.   Plan and Goals: 1)  Resume testing regularly 2)  Attempt to read nutrition labels and better manage portion control 3) Follow-up in 3 months on Thurs Feb 23rd @ 4:30 pm  Great to see you today!    Tilman Neat, PharmD [facility]  [phone]

## 2015-10-27 ENCOUNTER — Ambulatory Visit (INDEPENDENT_AMBULATORY_CARE_PROVIDER_SITE_OTHER): Payer: 59 | Admitting: Family Medicine

## 2015-10-27 VITALS — BP 125/79 | HR 93 | Temp 98.4°F | Resp 16 | Ht 60.5 in | Wt 223.4 lb

## 2015-10-27 DIAGNOSIS — Z114 Encounter for screening for human immunodeficiency virus [HIV]: Secondary | ICD-10-CM | POA: Diagnosis not present

## 2015-10-27 DIAGNOSIS — G2581 Restless legs syndrome: Secondary | ICD-10-CM

## 2015-10-27 DIAGNOSIS — F32A Depression, unspecified: Secondary | ICD-10-CM

## 2015-10-27 DIAGNOSIS — E1142 Type 2 diabetes mellitus with diabetic polyneuropathy: Secondary | ICD-10-CM | POA: Diagnosis not present

## 2015-10-27 DIAGNOSIS — E7800 Pure hypercholesterolemia, unspecified: Secondary | ICD-10-CM | POA: Diagnosis not present

## 2015-10-27 DIAGNOSIS — R300 Dysuria: Secondary | ICD-10-CM | POA: Diagnosis not present

## 2015-10-27 DIAGNOSIS — Z9989 Dependence on other enabling machines and devices: Secondary | ICD-10-CM

## 2015-10-27 DIAGNOSIS — G4733 Obstructive sleep apnea (adult) (pediatric): Secondary | ICD-10-CM | POA: Diagnosis not present

## 2015-10-27 DIAGNOSIS — F329 Major depressive disorder, single episode, unspecified: Secondary | ICD-10-CM

## 2015-10-27 LAB — POCT URINALYSIS DIP (MANUAL ENTRY)
BILIRUBIN UA: NEGATIVE
Glucose, UA: NEGATIVE
Ketones, POC UA: NEGATIVE
NITRITE UA: POSITIVE — AB
PH UA: 5
Protein Ur, POC: NEGATIVE
Spec Grav, UA: >=1.03
UROBILINOGEN UA: 0.2

## 2015-10-27 LAB — LIPID PANEL
Cholesterol: 152 mg/dL (ref 125–200)
HDL: 40 mg/dL — AB (ref >=46)
LDL Cholesterol: 87 mg/dL (ref <130)
Total CHOL/HDL Ratio: 3.8 Ratio (ref <=5.0)
Triglycerides: 123 mg/dL (ref <150)
VLDL: 25 mg/dL (ref <30)

## 2015-10-27 LAB — COMPREHENSIVE METABOLIC PANEL
ALBUMIN: 3.9 g/dL (ref 3.6–5.1)
ALT: 61 U/L — ABNORMAL HIGH (ref 6–29)
AST: 46 U/L — ABNORMAL HIGH (ref 10–35)
Alkaline Phosphatase: 81 U/L (ref 33–130)
BILIRUBIN TOTAL: 0.3 mg/dL (ref 0.2–1.2)
BUN: 11 mg/dL (ref 7–25)
CHLORIDE: 104 mmol/L (ref 98–110)
CO2: 25 mmol/L (ref 20–31)
CREATININE: 0.69 mg/dL (ref 0.50–1.05)
Calcium: 8.9 mg/dL (ref 8.6–10.4)
Glucose, Bld: 121 mg/dL — ABNORMAL HIGH (ref 65–99)
Potassium: 4.3 mmol/L (ref 3.5–5.3)
SODIUM: 139 mmol/L (ref 135–146)
TOTAL PROTEIN: 7.3 g/dL (ref 6.1–8.1)

## 2015-10-27 LAB — CBC WITH DIFFERENTIAL/PLATELET
BASOS ABS: 0 10*3/uL (ref 0.0–0.1)
BASOS PCT: 0 % (ref 0–1)
Eosinophils Absolute: 0.2 10*3/uL (ref 0.0–0.7)
Eosinophils Relative: 2 % (ref 0–5)
HCT: 38.3 % (ref 36.0–46.0)
HEMOGLOBIN: 12.7 g/dL (ref 12.0–15.0)
Lymphocytes Relative: 35 % (ref 12–46)
Lymphs Abs: 4.2 10*3/uL — ABNORMAL HIGH (ref 0.7–4.0)
MCH: 26.5 pg (ref 26.0–34.0)
MCHC: 33.2 g/dL (ref 30.0–36.0)
MCV: 80 fL (ref 78.0–100.0)
MONOS PCT: 6 % (ref 3–12)
MPV: 9.7 fL (ref 8.6–12.4)
Monocytes Absolute: 0.7 10*3/uL (ref 0.1–1.0)
NEUTROS ABS: 6.8 10*3/uL (ref 1.7–7.7)
NEUTROS PCT: 57 % (ref 43–77)
Platelets: 357 10*3/uL (ref 150–400)
RBC: 4.79 MIL/uL (ref 3.87–5.11)
RDW: 16.7 % — ABNORMAL HIGH (ref 11.5–15.5)
WBC: 11.9 10*3/uL — ABNORMAL HIGH (ref 4.0–10.5)

## 2015-10-27 LAB — POC MICROSCOPIC URINALYSIS (UMFC)

## 2015-10-27 MED ORDER — FLUCONAZOLE 150 MG PO TABS: 150.0000 mg | ORAL_TABLET | Freq: Once | ORAL | Status: DC

## 2015-10-27 MED ORDER — CIPROFLOXACIN HCL 500 MG PO TABS: 500.0000 mg | ORAL_TABLET | Freq: Two times a day (BID) | ORAL | Status: DC

## 2015-10-27 NOTE — Progress Notes (Signed)
Subjective:    Patient ID: Cristina [name], female    DOB: 1961-06-11, 54 y.o.   MRN: [id number]  10/27/2015  Follow-up   HPI This 54 y.o. female presents for four month follow-up:  1. DMII: Patient reports good compliance with medication, good tolerance to medication, and good symptom control.  Not checking sugars much.  Checks sugar once or twice per week.  Less hypoglycemia since switching Glipizide to evening.  Having numbness in toes; no nighttime awakening; bothers intermittently.   Regular sodas more again; trying to cut down.  Drinking 4 regular sodas per week; drinking one per day.  Restarted Invokana 100mg  daily.  2. HTN: Patient reports good compliance with medication, good tolerance to medication, and good symptom control.    3. Hyperlipidemia: Patient reports good compliance with medication, good tolerance to medication, and good symptom control.    4.  Anxiety and depression: Patient reports good compliance with medication, good tolerance to medication, and good symptom control.  Stress from taking classes at night.  Has new grandchild; daughter with four children.    5.  Dysuria: onset 2-3 weeks ago; took AZO with improvement.  Not completely resolve.  Still burning every time urinates almost.  No fever.  +n/v two episodes in last two weeks; occurred last two weeks.  No flank pain.    6.  Sinus congestion:  Augmentin prescribed two weeks ago.  7. Vaginal itching: onset with Augmentin for sinus congestion and bronchitis.  Has continued to take Mucinex D.  Did not take Tussionex.  Sinyus pressure is better but still coughing up small amount of congestion.  Cough has improved.  75% improved from visit. No malaise of fatigue.    8.  Obesity: taking Phentermine; drinking regular sodas; stress due to going to school at night; started in August 2016.  Taking on line classes; does have to go in for classes intermittently.  Not focused on weight loss.  Not exercising.    9. Loose  stools: onset several months ago; vegetables and lettuce trigger diarrhea.  S/p cholecystectomy.  No bloody stools.  Has had accidents.  Onset six months ago.  Having 4-5 stools per day; large volume stools.   Loose stools.  Taking Dulcolax daily still.       Review of Systems  Constitutional: Negative for fever, chills, diaphoresis, activity change, appetite change, fatigue and unexpected weight change.  HENT: Negative for congestion, dental problem, drooling, ear discharge, ear pain, facial swelling, hearing loss, mouth sores, nosebleeds, postnasal drip, rhinorrhea, sinus pressure, sneezing, sore throat, tinnitus, trouble swallowing and voice change.   Eyes: Negative for photophobia, pain, discharge, redness, itching and visual disturbance.  Respiratory: Negative for apnea, cough, choking, chest tightness, shortness of breath, wheezing and stridor.   Cardiovascular: Negative for chest pain, palpitations and leg swelling.  Gastrointestinal: Negative for nausea, vomiting, abdominal pain, diarrhea, constipation, blood in stool, abdominal distention, anal bleeding and rectal pain.  Endocrine: Negative for cold intolerance, heat intolerance, polydipsia, polyphagia and polyuria.  Genitourinary: Negative for dysuria, urgency, frequency, hematuria, flank pain, decreased urine volume, vaginal bleeding, vaginal discharge, enuresis, difficulty urinating, genital sores, vaginal pain, menstrual problem, pelvic pain and dyspareunia.  Musculoskeletal: Negative for myalgias, back pain, joint swelling, arthralgias, gait problem, neck pain and neck stiffness.  Skin: Negative for color change, pallor, rash and wound.  Allergic/Immunologic: Negative for environmental allergies, food allergies and immunocompromised state.  Neurological: Negative for dizziness, tremors, seizures, syncope, facial asymmetry, speech difficulty, weakness, light-headedness, numbness and  headaches.  Hematological: Negative for adenopathy.  Does not bruise/bleed easily.  Psychiatric/Behavioral: Negative for suicidal ideas, hallucinations, behavioral problems, confusion, sleep disturbance, self-injury, dysphoric mood, decreased concentration and agitation. The patient is not nervous/anxious and is not hyperactive.     Past Medical History  Diagnosis Date  . Hemorrhoid   . Fissure, anal   . Cystocele   . Rectocele   . Diabetes mellitus   . Genital herpes   . Arthritis   . Depression   . Hyperlipidemia   . Obesity   . GERD (gastroesophageal reflux disease)   . CTS (carpal tunnel syndrome)   . Hx of migraine headaches   . Dysfunction of eustachian tube   . Constipation   . Allergic rhinitis, cause unspecified   . Iron deficiency anemia, unspecified   . Insomnia   . OSA on CPAP   . Hypertension    Past Surgical History  Procedure Laterality Date  . Cholecystectomy    . Carpal tunnel release  1989    Left  . Ectopic pregnancy surgery  1993  . Tubal ligation  1988  . Cholecystectomy    . Sleep study  09/11/2012    severe OSA.  CPAP titration at 12 cm water pressure.  . Abdominal hysterectomy  11/11/2010    Marvel Plan.  Ovaries intact.  Uterine fibroids with DUB.  . Colonoscopy  06/29/2012    normal.  Repeat in 5 years.  . Esophagogastroduodenoscopy  12/13/2011    dysphagia.  Henrene Pastor.  Normal.   Allergies  Allergen Reactions  . Fish Oil Rash    Social History   Social History  . Marital Status: Single    Spouse Name: n/a  . Number of Children: 3  . Years of Education: college   Occupational History  . NURSE SECRETARY     [employer]   Social History Main Topics  . Smoking status: Former Research scientist (life sciences)  . Smokeless tobacco: Never Used     Comment: smoked occasionally longest 6 mos.  . Alcohol Use: 0.6 oz/week    1 Glasses of wine per week     Comment: 2 x monthly drinks wine  . Drug Use: No  . Sexual Activity: No   Other Topics Concern  . Not on file   Social History Narrative      Marital  status:  Divorced in 2000 after six years of marriage; not dating.      Children:  3 daughters (37, 53, 57); 5 grandchildren.      Lives: alone.  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      Tobacco: none      Alcohol: socially; special occasions.  Rare.      Drugs: none      Exercise:  Sporadic; stationary bike.      Sexual activity:  Not sexually active since 2011.       Guns:  No guns in the home.       Smoke detectors in use,       Seatbelt:  Sometimes uses seat belts. 75% of time.     Family History  Problem Relation Age of Onset  . Hypertension Mother 40  . Osteoarthritis Mother   . Colon cancer Mother   . Diabetes Mother   . Cancer Mother 21    Colon cancer  . Colon cancer Paternal Grandmother   . Cancer Paternal Grandmother   . Colon cancer  uncle/aunt  . Stroke Maternal Grandmother   . Colon polyps Brother   . Diabetes Daughter   . Hypertension Daughter   . Sleep apnea Daughter   . Mental illness Daughter     depression  . Migraines Daughter   . Colon polyps Brother   . Cirrhosis Brother     alcoholism  . Migraines Daughter   . Mental illness Daughter     anxiety attacks  . Protein S deficiency Daughter        Objective:    BP 125/79 mmHg  Pulse 93  Temp(Src) 98.4 F (36.9 C) (Oral)  Resp 16  Ht 5' 0.5" (1.537 m)  Wt 223 lb 6.4 oz (101.334 kg)  BMI 42.90 kg/m2  SpO2 97% Physical Exam  Constitutional: She is oriented to person, place, and time. She appears well-developed and well-nourished. No distress.  HENT:  Head: Normocephalic and atraumatic.  Right Ear: External ear normal.  Left Ear: External ear normal.  Nose: Nose normal.  Mouth/Throat: Oropharynx is clear and moist.  Eyes: Conjunctivae and EOM are normal. Pupils are equal, round, and reactive to light.  Neck: Normal range of motion. Neck supple. Carotid bruit is not present. No thyromegaly present.  Cardiovascular: Normal rate,  regular rhythm, normal heart sounds and intact distal pulses.  Exam reveals no gallop and no friction rub.   No murmur heard. Pulmonary/Chest: Effort normal and breath sounds normal. She has no wheezes. She has no rales.  Abdominal: Soft. Bowel sounds are normal. She exhibits no distension and no mass. There is no tenderness. There is no rebound and no guarding.  Lymphadenopathy:    She has no cervical adenopathy.  Neurological: She is alert and oriented to person, place, and time. No cranial nerve deficit.  Skin: Skin is warm and dry. No rash noted. She is not diaphoretic. No erythema. No pallor.  Psychiatric: She has a normal mood and affect. Her behavior is normal.        Assessment & Plan:   1. Type 2 diabetes mellitus with diabetic polyneuropathy, without long-term current use of insulin ([place])   2. OSA on CPAP   3. Restless leg syndrome   4. Depression   5. Pure hypercholesterolemia   6. Dysuria   7. Screening for HIV (human immunodeficiency virus)     Orders Placed This Encounter  Procedures  . Urine culture  . CBC with Differential/Platelet  . Comprehensive metabolic panel    Order Specific Question:  Has the patient fasted?    Answer:  Yes  . Hemoglobin A1c  . Lipid panel    Order Specific Question:  Has the patient fasted?    Answer:  Yes  . HIV antibody  . Microalbumin, urine  . POCT urinalysis dipstick  . POCT Microscopic Urinalysis ([facility])   Meds ordered this encounter  Medications  . ciprofloxacin (CIPRO) 500 MG tablet    Sig: Take 1 tablet (500 mg total) by mouth 2 (two) times daily.    Dispense:  14 tablet    Refill:  0  . fluconazole (DIFLUCAN) 150 MG tablet    Sig: Take 1 tablet (150 mg total) by mouth once. Repeat if needed    Dispense:  2 tablet    Refill:  0    Return in about 3 months (around 01/25/2016) for complete physical examiniation.    Zeah Germano Elayne Guerin, M.D. [facility] [address] [place],  [place]  [address] [phone] phone (  [phone] fax

## 2015-10-28 LAB — URINE CULTURE
Colony Count: NO GROWTH
ORGANISM ID, BACTERIA: NO GROWTH

## 2015-10-28 LAB — HIV ANTIBODY (ROUTINE TESTING W REFLEX): HIV: NONREACTIVE

## 2015-10-28 LAB — HEMOGLOBIN A1C
HEMOGLOBIN A1C: 7.7 % — AB (ref <5.7)
MEAN PLASMA GLUCOSE: 174 mg/dL — AB (ref <117)

## 2015-10-28 LAB — MICROALBUMIN, URINE: Microalb, Ur: 1 mg/dL

## 2015-11-06 ENCOUNTER — Other Ambulatory Visit: Payer: Self-pay

## 2015-11-06 MED ORDER — METFORMIN HCL 1000 MG PO TABS: 1000.0000 mg | ORAL_TABLET | Freq: Two times a day (BID) | ORAL | Status: DC

## 2015-11-07 NOTE — Progress Notes (Signed)
I have reviewed this pharmacist's note and agree  

## 2015-11-10 ENCOUNTER — Encounter: Payer: Self-pay | Admitting: Family Medicine

## 2015-11-20 MED ORDER — CANAGLIFLOZIN 300 MG PO TABS: 300.0000 mg | ORAL_TABLET | Freq: Every day | ORAL | Status: DC

## 2015-11-20 NOTE — Addendum Note (Signed)
Addended by: [name] on: 11/20/2015 10:31 PM   Modules accepted: Orders

## 2015-11-21 ENCOUNTER — Other Ambulatory Visit: Payer: Self-pay | Admitting: Family Medicine

## 2015-11-21 MED FILL — ACYCLOVIR 400 MG TABLET: 400 | 90 days supply | Qty: 180 | Fill #3

## 2015-11-21 MED FILL — INVOKANA 300 MG TABLET: 300 | 90 days supply | Qty: 90 | Fill #0

## 2015-11-21 MED FILL — PHENTERMINE 37.5 MG TABLET: 37.5 | 30 days supply | Qty: 30 | Fill #3

## 2015-11-21 MED FILL — SIMVASTATIN 40 MG TABLET: 40 | 90 days supply | Qty: 90 | Fill #3

## 2015-11-21 MED FILL — PANTOPRAZOLE SOD DR 40 MG T: 40 | 90 days supply | Qty: 180 | Fill #1

## 2015-11-21 MED FILL — metFORMIN HCL 1000 MG TABS: 1000 | 90 days supply | Qty: 180 | Fill #0

## 2015-11-21 MED FILL — ZOLPIDEM TARTRATE 5 MG TAB: 5 | 30 days supply | Qty: 30 | Fill #3

## 2015-11-23 ENCOUNTER — Other Ambulatory Visit: Payer: Self-pay

## 2015-11-23 MED ORDER — SERTRALINE HCL 100 MG PO TABS: ORAL_TABLET | ORAL | Status: DC

## 2015-11-23 MED FILL — glipiZIDE XL 10 MG TB24: 10 | 90 days supply | Qty: 90 | Fill #0

## 2015-11-23 MED FILL — SERTRALINE HCL 100 MG TAB: 100 | 90 days supply | Qty: 135 | Fill #0

## 2015-11-23 MED FILL — JANUVIA 100 MG TABLET: 100 | 90 days supply | Qty: 90 | Fill #0

## 2015-11-27 ENCOUNTER — Encounter: Payer: Self-pay | Admitting: Family Medicine

## 2015-12-05 DIAGNOSIS — E119 Type 2 diabetes mellitus without complications: Secondary | ICD-10-CM | POA: Diagnosis not present

## 2015-12-05 DIAGNOSIS — H40013 Open angle with borderline findings, low risk, bilateral: Secondary | ICD-10-CM | POA: Diagnosis not present

## 2015-12-05 DIAGNOSIS — H43393 Other vitreous opacities, bilateral: Secondary | ICD-10-CM | POA: Diagnosis not present

## 2015-12-11 ENCOUNTER — Other Ambulatory Visit: Payer: Self-pay

## 2015-12-11 DIAGNOSIS — Z1231 Encounter for screening mammogram for malignant neoplasm of breast: Secondary | ICD-10-CM

## 2015-12-12 MED FILL — BUPROPION HCL SR 100 MG TAB: 100 | 90 days supply | Qty: 90 | Fill #1

## 2015-12-28 ENCOUNTER — Ambulatory Visit: Payer: 59

## 2016-01-04 ENCOUNTER — Ambulatory Visit (INDEPENDENT_AMBULATORY_CARE_PROVIDER_SITE_OTHER): Payer: Self-pay | Admitting: Family Medicine

## 2016-01-04 ENCOUNTER — Other Ambulatory Visit: Payer: Self-pay | Admitting: Family Medicine

## 2016-01-04 ENCOUNTER — Encounter: Payer: Self-pay | Admitting: Pharmacist

## 2016-01-04 VITALS — BP 110/70 | Wt 223.0 lb

## 2016-01-04 DIAGNOSIS — E119 Type 2 diabetes mellitus without complications: Secondary | ICD-10-CM

## 2016-01-04 NOTE — Progress Notes (Signed)
Subjective:  Patient is a 55 yo female with type 2 diabetes who presents today for 3 month follow-up as part of the employer-sponsored [employer] to Well[employer] program. Current diabetes regimen includes Invokana, glipizide ER, metformin, and Januvia . Patient also continues on daily ASA, ACEi, and statin. Most recent MD follow-up was Dec 2016. Patient has a pending appt for May 2017. No major health changes at this time.     Diabetes Assessment:  Changes to diabetes regimen include increasing Invokana from 100 mg to 300 mg.  Patient has not yet started the 300 mg strength as she is finishing the 100 mg tabs she currently had.  She is hesitant to increase the dose for two reasons: 1) tablet size is larger and she does not like this, 2) fearful of increase freq of yeast infection with increased dose.  I have encouraged her to try the higher dose at least for several weeks before giving up.  Patient reports good medication compliance, but she does forget a dose occasionally.  Most recent A1c was 7.7% (prev 7.4% in Sept 2016), which is increased and exceeding goal of less than 7%.  Weight has remained the same since since last visit, but is overall increased vs this time last year.  She continues taking phentermine, but MD will reassess this med at next appt.  She was interested in gastric sleeve but out of pocket costs will be too much for this patient to afford and she is unable to have the procedure at this time.  Patient did not bring meter but has increased testing to 2-3 times weekly per MD recommendation.  Last reading was 154, fasting.  Fasting readings range 130-160s per patient report.  She uses [employer] and keeps one meter at work and one at home.  Hypoglycemia is much less often after adjusting timing of glipizide dose.  Patient has not had recent hypoglycemic episodes.     Patient reports continued mild signs and symptoms of neuropathy including tingling and strange, sometimes painful sensation on tops of  toes, both feet, specifically if she rubs them on something (ex: carpet) or hits her foot against something.  Patient reports MD is aware and she has described this to her.  No signs of infection.  Patient is up to date on eye and dental exam.      Lifestyle Assessment:  Exercising - None at this time due to lack of time as pt continues working full time, has started online classes, and recently had a new grandchild born.  She would like to resume using stationary bike but needs new batteries for bike.  We have set goal to replace batteries and to resume using bike at least once per week.  Diet -  Patient reports overall improvement in diet.  She is making better choices overall, example chicken vs beef.  Portion sizes have improved.  Also trying to cook more vs going out.  Cooking on Saturdays and eating leftovers during the week.  Also limiting sugary drinks and koolaid, drinking fewer sodas, especially regular sodas.     Plan and Goals: 1)  Continue testing regularly, good job with this 2)  Continue to make healthy dietary choices, monitoring portion control as well 3)  Replace batteries in stationary bike and resume riding at least once weekly 4)  Goal of improved A1c by next appt with Dr. Tamala Julian 5)  Follow-up on Thursday June 29th @ 4:30 pm  Great to see you today!    Tilman Neat,  PharmD [facility]  [phone]

## 2016-01-04 NOTE — Patient Instructions (Signed)
1)  Continue testing regularly, good job with this 2)  Continue to make healthy dietary choices, monitoring portion control as well 3)  Replace batteries in stationary bike and resume riding at least once weekly 4)  Goal of improved A1c by next appt with Dr. Tamala Julian 5)  Follow-up on Thursday June 29th @ 4:30 pm  Great to see you today!

## 2016-01-07 NOTE — Telephone Encounter (Signed)
Please call in refill of Phentermine as approved. 

## 2016-01-08 MED FILL — PHENTERMINE 37.5 MG TABLET: 37.5 | 30 days supply | Qty: 30 | Fill #0

## 2016-01-08 MED FILL — ANUCORT-HC 25 MG SUPP: 25 | 12 days supply | Qty: 25 | Fill #0

## 2016-01-08 NOTE — Telephone Encounter (Signed)
Faxed

## 2016-01-12 ENCOUNTER — Ambulatory Visit
Admission: RE | Admit: 2016-01-12 | Discharge: 2016-01-12 | Disposition: A | Discharge status: Home / Self Care | Payer: 59 | Source: Ambulatory Visit

## 2016-01-12 DIAGNOSIS — Z1231 Encounter for screening mammogram for malignant neoplasm of breast: Secondary | ICD-10-CM | POA: Diagnosis not present

## 2016-01-15 ENCOUNTER — Encounter: Payer: Self-pay | Admitting: Internal Medicine

## 2016-01-15 ENCOUNTER — Ambulatory Visit (INDEPENDENT_AMBULATORY_CARE_PROVIDER_SITE_OTHER): Payer: 59 | Admitting: Internal Medicine

## 2016-01-15 VITALS — BP 126/88 | HR 84 | Ht 60.63 in | Wt 226.4 lb

## 2016-01-15 DIAGNOSIS — K625 Hemorrhage of anus and rectum: Secondary | ICD-10-CM

## 2016-01-15 DIAGNOSIS — K219 Gastro-esophageal reflux disease without esophagitis: Secondary | ICD-10-CM

## 2016-01-15 DIAGNOSIS — K760 Fatty (change of) liver, not elsewhere classified: Secondary | ICD-10-CM

## 2016-01-15 DIAGNOSIS — K648 Other hemorrhoids: Secondary | ICD-10-CM | POA: Diagnosis not present

## 2016-01-15 DIAGNOSIS — K602 Anal fissure, unspecified: Secondary | ICD-10-CM | POA: Diagnosis not present

## 2016-01-15 MED ORDER — DILTIAZEM GEL 2 %: 1.0000 "application " | Freq: Every day | CUTANEOUS | Status: DC

## 2016-01-15 NOTE — Patient Instructions (Signed)
We have sent the following medications to your pharmacy for you to pick up at your convenience: Diltiazem Gel.  [employer].  If you are not better in 6 weeks, call us to discuss the possibility of surgery.

## 2016-01-15 NOTE — Progress Notes (Signed)
HISTORY OF PRESENT ILLNESS:  Cristina [name] is a 55 y.o. female with multiple medical problems as listed below who presents today for evaluation of rectal bleeding over 2 months. She does have a history of anal fissure. Last evaluated in the office June 2015 for gas bloat, fatty liver, morbid obesity with BMI 43, and GERD. She also has a family history of colon cancer. Last colonoscopy 2013 was normal. Last upper endoscopy 2012 was normal. Currently, does not describe significant rectal discomfort. She has been treating her symptoms with a combination of Preparation H and Anusol suppositories without relief. No other associated symptoms. She continues with chronic alternating bowel habits.  REVIEW OF SYSTEMS:  All non-GI ROS negative except for sinus allergy trouble, arthritis, back pain, cough, depression, fatigue, headaches, muscle cramps, night sweats, increased thirst, urinary leakage  Past Medical History  Diagnosis Date  . Hemorrhoid   . Fissure, anal   . Cystocele   . Rectocele   . Diabetes mellitus   . Genital herpes   . Arthritis   . Depression   . Hyperlipidemia   . Obesity   . GERD (gastroesophageal reflux disease)   . CTS (carpal tunnel syndrome)   . Hx of migraine headaches   . Dysfunction of eustachian tube   . Constipation   . Allergic rhinitis, cause unspecified   . Iron deficiency anemia, unspecified   . Insomnia   . OSA on CPAP   . Hypertension   . Fatty liver     Past Surgical History  Procedure Laterality Date  . Cholecystectomy    . Carpal tunnel release  1989    Left  . Ectopic pregnancy surgery  1993  . Tubal ligation  1988  . Cholecystectomy    . Sleep study  09/11/2012    severe OSA.  CPAP titration at 12 cm water pressure.  . Abdominal hysterectomy  11/11/2010    Marvel Plan.  Ovaries intact.  Uterine fibroids with DUB.  . Colonoscopy  06/29/2012    normal.  Repeat in 5 years.  . Esophagogastroduodenoscopy  12/13/2011    dysphagia.  Henrene Pastor.  Normal.     Social History Cristina [name]  reports that she has quit smoking. She has never used smokeless tobacco. She reports that she does not drink alcohol or use illicit drugs.  family history includes Cancer in her paternal grandmother; Cirrhosis in her brother; Colon cancer in her mother, paternal aunt, and paternal grandmother; Colon cancer (age of onset: 68) in her mother; Colon polyps in her brother and brother; Diabetes in her daughter and mother; Hypertension in her daughter; Hypertension (age of onset: 56) in her mother; Mental illness in her daughter and daughter; Migraines in her daughter and daughter; Osteoarthritis in her mother; Protein S deficiency in her daughter; Sleep apnea in her daughter; Stroke in her maternal grandmother.  Allergies  Allergen Reactions  . Fish Oil Rash       PHYSICAL EXAMINATION: Vital signs: BP 126/88 mmHg  Pulse 84  Ht 5' 0.63" (1.54 m)  Wt 226 lb 6 oz (102.683 kg)  BMI 43.30 kg/m2  Constitutional: Pleasant, obese, no acute distress Psychiatric: alert and oriented x3, cooperative Eyes: extraocular movements intact, anicteric, conjunctiva pink Mouth: oral pharynx moist, no lesions Neck: supple no lymphadenopathy Cardiovascular: heart regular rate and rhythm, no murmur Lungs: clear to auscultation bilaterally Abdomen: soft, obese, nontender, nondistended, no obvious ascites, no peritoneal signs, normal bowel sounds, no organomegaly Rectal: Small external hemorrhoids not inflamed. Tender posterior  defect consistent with fissure. Extremities: no clubbing cyanosis or lower extremity edema bilaterally Skin: no lesions on visible extremities Neuro: No focal deficits. Cranial nerves intact  ANOSCOPY Moderate internal hemorrhoids slightly friable. Posterior fissure with blood.  ASSESSMENT:  #1. Posterior anal fissure #2. Alternating bowel habits #3. Family history of colon cancer. Last colonoscopy 2013 #4. GERD. #5. Fatty liver   PLAN:  #1.  Prescribe 2% diltiazem cream to be applied 5 times daily for 6 weeks in an effort to healing anal fissure #2. If fissure not healed with ongoing bleeding or the development of pain, surgical referral. She has agreed contact the office for either #3. Daily fiber supplementation with Metamucil for alternating bowel habits and fissure #4. Due for routine screening colonoscopy 2018 #5. Reflux precautions with attention to weight loss. Weight loss for fatty liver as well as exercise  #6. Continue PPI to control GERD symptoms #7. Resume general medical care with PCP

## 2016-01-19 MED FILL — ZOLPIDEM TARTRATE 5 MG TAB: 5 | 30 days supply | Qty: 30 | Fill #4

## 2016-01-21 NOTE — Progress Notes (Signed)
I have reviewed this pharmacist's note and agree  

## 2016-01-24 ENCOUNTER — Ambulatory Visit (INDEPENDENT_AMBULATORY_CARE_PROVIDER_SITE_OTHER): Payer: 59 | Admitting: Family Medicine

## 2016-01-24 VITALS — BP 124/80 | HR 94 | Temp 98.6°F | Resp 16 | Ht 60.25 in | Wt 226.4 lb

## 2016-01-24 DIAGNOSIS — M5442 Lumbago with sciatica, left side: Secondary | ICD-10-CM | POA: Diagnosis not present

## 2016-01-24 DIAGNOSIS — J0101 Acute recurrent maxillary sinusitis: Secondary | ICD-10-CM

## 2016-01-24 DIAGNOSIS — E1129 Type 2 diabetes mellitus with other diabetic kidney complication: Secondary | ICD-10-CM | POA: Diagnosis not present

## 2016-01-24 DIAGNOSIS — E1121 Type 2 diabetes mellitus with diabetic nephropathy: Secondary | ICD-10-CM | POA: Diagnosis not present

## 2016-01-24 DIAGNOSIS — R350 Frequency of micturition: Secondary | ICD-10-CM

## 2016-01-24 DIAGNOSIS — R6889 Other general symptoms and signs: Secondary | ICD-10-CM

## 2016-01-24 DIAGNOSIS — M5430 Sciatica, unspecified side: Secondary | ICD-10-CM | POA: Diagnosis not present

## 2016-01-24 LAB — POCT CBC
GRANULOCYTE PERCENT: 55.6 % (ref 37–80)
HEMATOCRIT: 38.6 % (ref 37.7–47.9)
Hemoglobin: 13.3 g/dL (ref 12.2–16.2)
Lymph, poc: 4.7 — AB (ref 0.6–3.4)
MCH, POC: 28.3 pg (ref 27–31.2)
MCHC: 34.5 g/dL (ref 31.8–35.4)
MCV: 82.1 fL (ref 80–97)
MID (CBC): 0.9 (ref 0–0.9)
MPV: 7.7 fL (ref 0–99.8)
PLATELET COUNT, POC: 301 10*3/uL (ref 142–424)
POC Granulocyte: 7.1 — AB (ref 2–6.9)
POC LYMPH %: 37.1 % (ref 10–50)
POC MID %: 7.3 % (ref 0–12)
RBC: 4.69 M/uL (ref 4.04–5.48)
RDW, POC: 16.1 %
WBC: 12.8 10*3/uL — AB (ref 4.6–10.2)

## 2016-01-24 LAB — POCT URINALYSIS DIP (MANUAL ENTRY)
Bilirubin, UA: NEGATIVE
Ketones, POC UA: NEGATIVE
NITRITE UA: NEGATIVE
PH UA: 5
Protein Ur, POC: NEGATIVE
RBC UA: NEGATIVE
Spec Grav, UA: 1.015
UROBILINOGEN UA: 0.2

## 2016-01-24 LAB — POC MICROSCOPIC URINALYSIS (UMFC): Mucus: ABSENT

## 2016-01-24 LAB — POCT GLYCOSYLATED HEMOGLOBIN (HGB A1C): Hemoglobin A1C: 7.5

## 2016-01-24 LAB — POCT INFLUENZA A/B
INFLUENZA A, POC: NEGATIVE
Influenza B, POC: NEGATIVE

## 2016-01-24 MED ORDER — AMOXICILLIN-POT CLAVULANATE 875-125 MG PO TABS: 1.0000 | ORAL_TABLET | Freq: Two times a day (BID) | ORAL | Status: DC

## 2016-01-24 MED ORDER — FLUCONAZOLE 150 MG PO TABS: 150.0000 mg | ORAL_TABLET | Freq: Once | ORAL | Status: DC

## 2016-01-24 MED ORDER — BENZONATATE 100 MG PO CAPS: 100.0000 mg | ORAL_CAPSULE | Freq: Three times a day (TID) | ORAL | Status: DC | PRN

## 2016-01-24 NOTE — Progress Notes (Signed)
Subjective:    Patient ID: Cristina [name], female    DOB: [birth year], 55 y.o.   MRN: [id number]  01/24/2016  Dysuria; Abdominal Pain; Ear Pain; and Eye Drainage   HPI This 55 y.o. female presents for evaluation of fever, headaches, scratchy throat, sinus congestion, L ear pain, R eye drainage in morning.  Awoke five days ago with fever Tmax 101.6.  No recurrent fever since onset.  +dizziness.  +chills/sweats.  +body aches.  +HA.  +L ear pain started today.  +rhinorrhea; +nasal congestion; +drainage in R eye in a.m.  No blurred vision.  R eye is red upon awakening.  Some coughing; no SOB; no wheezing. +nausea; no vomiting; +frequent stools.  No dysuria, +frequency; +urgency, nocturia x 1-2.    L lower back pain: with pain radiating into L hip and down into L hip.  Pain radiating into L groin.  No n/t/w/burning.  No saddle paresthesias; no b/b dysfunction.   Review of Systems  Constitutional: Negative for fever, chills, diaphoresis and fatigue.  Eyes: Negative for visual disturbance.  Respiratory: Negative for cough and shortness of breath.   Cardiovascular: Negative for chest pain, palpitations and leg swelling.  Gastrointestinal: Negative for nausea, vomiting, abdominal pain, diarrhea and constipation.  Endocrine: Negative for cold intolerance, heat intolerance, polydipsia, polyphagia and polyuria.  Neurological: Negative for dizziness, tremors, seizures, syncope, facial asymmetry, speech difficulty, weakness, light-headedness, numbness and headaches.    Past Medical History  Diagnosis Date  . Hemorrhoid   . Fissure, anal   . Cystocele   . Rectocele   . Diabetes mellitus   . Genital herpes   . Arthritis   . Depression   . Hyperlipidemia   . Obesity   . GERD (gastroesophageal reflux disease)   . CTS (carpal tunnel syndrome)   . Hx of migraine headaches   . Dysfunction of eustachian tube   . Constipation   . Allergic rhinitis, cause unspecified   . Iron deficiency anemia,  unspecified   . Insomnia   . OSA on CPAP   . Hypertension   . Fatty liver    Past Surgical History  Procedure Laterality Date  . Cholecystectomy    . Carpal tunnel release  1989    Left  . Ectopic pregnancy surgery  1993  . Tubal ligation  1988  . Cholecystectomy    . Sleep study  09/11/2012    severe OSA.  CPAP titration at 12 cm water pressure.  . Abdominal hysterectomy  11/11/2010    [place] Plan.  Ovaries intact.  Uterine fibroids with DUB.  . Colonoscopy  06/29/2012    normal.  Repeat in 5 years.  . Esophagogastroduodenoscopy  12/13/2011    dysphagia.  Henrene Pastor.  Normal.   Allergies  Allergen Reactions  . Fish Oil Rash    Social History   Social History  . Marital Status: Single    Spouse Name: n/a  . Number of Children: 3  . Years of Education: college   Occupational History  . NURSE SECRETARY     [employer]   Social History Main Topics  . Smoking status: Former Research scientist (life sciences)  . Smokeless tobacco: Never Used     Comment: smoked occasionally longest 6 mos.  . Alcohol Use: No  . Drug Use: No  . Sexual Activity: No   Other Topics Concern  . Not on file   Social History Narrative      Marital status:  Divorced in 2000 after six years of marriage; not  dating.      Children:  3 daughters (37, 19, 25); 5 grandchildren.      Lives: alone.  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      Tobacco: none      Alcohol: socially; special occasions.  Rare.      Drugs: none      Exercise:  Sporadic; stationary bike.      Sexual activity:  Not sexually active since 2011.       Guns:  No guns in the home.       Smoke detectors in use,       Seatbelt:  Sometimes uses seat belts. 75% of time.     Family History  Problem Relation Age of Onset  . Hypertension Mother 2  . Osteoarthritis Mother   . Colon cancer Mother   . Diabetes Mother   . Colon cancer Mother 60  . Colon cancer Paternal Grandmother   . Cancer  Paternal Grandmother   . Colon cancer Paternal Aunt   . Stroke Maternal Grandmother   . Colon polyps Brother     x 2 brother  . Diabetes Daughter   . Hypertension Daughter   . Sleep apnea Daughter   . Mental illness Daughter     depression  . Migraines Daughter   . Colon polyps Brother   . Cirrhosis Brother     alcoholism  . Migraines Daughter   . Mental illness Daughter     anxiety attacks  . Protein S deficiency Daughter        Objective:    BP 124/80 mmHg  Pulse 94  Temp(Src) 98.6 F (37 C) (Oral)  Resp 16  Ht 5' 0.25" (1.53 m)  Wt 226 lb 6.4 oz (102.694 kg)  BMI 43.87 kg/m2  SpO2 94% Physical Exam  Constitutional: She is oriented to person, place, and time. She appears well-developed and well-nourished. No distress.  HENT:  Head: Normocephalic and atraumatic.  Right Ear: External ear normal.  Left Ear: External ear normal.  Nose: Nose normal.  Mouth/Throat: Oropharynx is clear and moist.  Eyes: Conjunctivae and EOM are normal. Pupils are equal, round, and reactive to light.  Neck: Normal range of motion. Neck supple. Carotid bruit is not present. No thyromegaly present.  Cardiovascular: Normal rate, regular rhythm, normal heart sounds and intact distal pulses.  Exam reveals no gallop and no friction rub.   No murmur heard. Pulmonary/Chest: Effort normal and breath sounds normal. She has no wheezes. She has no rales.  Abdominal: Soft. Bowel sounds are normal. She exhibits no distension and no mass. There is no tenderness. There is no rebound and no guarding.  Lymphadenopathy:    She has no cervical adenopathy.  Neurological: She is alert and oriented to person, place, and time. No cranial nerve deficit.  Skin: Skin is warm and dry. No rash noted. She is not diaphoretic. No erythema. No pallor.  Psychiatric: She has a normal mood and affect. Her behavior is normal.        Assessment & Plan:   1. Flu-like symptoms   2. Type 2 diabetes mellitus with diabetic  nephropathy, without long-term current use of insulin ([place])   3. Left-sided low back pain with left-sided sciatica   4. Acute recurrent maxillary sinusitis   5. Urinary frequency     Orders Placed This Encounter  Procedures  . Urine culture  . Comprehensive metabolic panel  .  POCT CBC  . POCT glycosylated hemoglobin (Hb A1C)  . POCT Influenza A/B  . POCT urinalysis dipstick  . POCT Microscopic Urinalysis ([facility])   Meds ordered this encounter  Medications  . amoxicillin-clavulanate (AUGMENTIN) 875-125 MG tablet    Sig: Take 1 tablet by mouth 2 (two) times daily.    Dispense:  20 tablet    Refill:  0  . fluconazole (DIFLUCAN) 150 MG tablet    Sig: Take 1 tablet (150 mg total) by mouth once. Repeat if needed    Dispense:  2 tablet    Refill:  0  . benzonatate (TESSALON) 100 MG capsule    Sig: Take 1-2 capsules (100-200 mg total) by mouth 3 (three) times daily as needed for cough.    Dispense:  40 capsule    Refill:  0    No Follow-up on file.    Irena Gaydos Elayne Guerin, M.D. [facility] [address] [place], [place]  [address] [phone] phone [phone] fax

## 2016-01-24 NOTE — Patient Instructions (Signed)
Sciatica With Rehab The sciatic nerve runs from the back down the leg and is responsible for sensation and control of the muscles in the back (posterior) side of the thigh, lower leg, and foot. Sciatica is a condition that is characterized by inflammation of this nerve.  SYMPTOMS   Signs of nerve damage, including numbness and/or weakness along the posterior side of the lower extremity.  Pain in the back of the thigh that may also travel down the leg.  Pain that worsens when sitting for long periods of time.  Occasionally, pain in the back or buttock. CAUSES  Inflammation of the sciatic nerve is the cause of sciatica. The inflammation is due to something irritating the nerve. Common sources of irritation include:  Sitting for long periods of time.  Direct trauma to the nerve.  Arthritis of the spine.  Herniated or ruptured disk.  Slipping of the vertebrae (spondylolisthesis).  Pressure from soft tissues, such as muscles or ligament-like tissue (fascia). RISK INCREASES WITH:  Sports that place pressure or stress on the spine (football or weightlifting).  Poor strength and flexibility.  Failure to warm up properly before activity.  Family history of low back pain or disk disorders.  Previous back injury or surgery.  Poor body mechanics, especially when lifting, or poor posture. PREVENTION   Warm up and stretch properly before activity.  Maintain physical fitness:  Strength, flexibility, and endurance.  Cardiovascular fitness.  Learn and use proper technique, especially with posture and lifting. When possible, have coach correct improper technique.  Avoid activities that place stress on the spine. PROGNOSIS If treated properly, then sciatica usually resolves within 6 weeks. However, occasionally surgery is necessary.  RELATED COMPLICATIONS   Permanent nerve damage, including pain, numbness, tingle, or weakness.  Chronic back pain.  Risks of surgery: infection,  bleeding, nerve damage, or damage to surrounding tissues. TREATMENT Treatment initially involves resting from any activities that aggravate your symptoms. The use of ice and medication may help reduce pain and inflammation. The use of strengthening and stretching exercises may help reduce pain with activity. These exercises may be performed at home or with referral to a therapist. A therapist may recommend further treatments, such as transcutaneous electronic nerve stimulation (TENS) or ultrasound. Your caregiver may recommend corticosteroid injections to help reduce inflammation of the sciatic nerve. If symptoms persist despite non-surgical (conservative) treatment, then surgery may be recommended. MEDICATION  If pain medication is necessary, then nonsteroidal anti-inflammatory medications, such as aspirin and ibuprofen, or other minor pain relievers, such as acetaminophen, are often recommended.  Do not take pain medication for 7 days before surgery.  Prescription pain relievers may be given if deemed necessary by your caregiver. Use only as directed and only as much as you need.  Ointments applied to the skin may be helpful.  Corticosteroid injections may be given by your caregiver. These injections should be reserved for the most serious cases, because they may only be given a certain number of times. HEAT AND COLD  Cold treatment (icing) relieves pain and reduces inflammation. Cold treatment should be applied for 10 to 15 minutes every 2 to 3 hours for inflammation and pain and immediately after any activity that aggravates your symptoms. Use ice packs or massage the area with a piece of ice (ice massage).  Heat treatment may be used prior to performing the stretching and strengthening activities prescribed by your caregiver, physical therapist, or athletic trainer. Use a heat pack or soak the injury in warm water.   SEEK MEDICAL CARE IF:  Treatment seems to offer no benefit, or the condition  worsens.  Any medications produce adverse side effects. EXERCISES  RANGE OF MOTION (ROM) AND STRETCHING EXERCISES - Sciatica Most people with sciatic will find that their symptoms worsen with either excessive bending forward (flexion) or arching at the low back (extension). The exercises which will help resolve your symptoms will focus on the opposite motion. Your physician, physical therapist or athletic trainer will help you determine which exercises will be most helpful to resolve your low back pain. Do not complete any exercises without first consulting with your clinician. Discontinue any exercises which worsen your symptoms until you speak to your clinician. If you have pain, numbness or tingling which travels down into your buttocks, leg or foot, the goal of the therapy is for these symptoms to move closer to your back and eventually resolve. Occasionally, these leg symptoms will get better, but your low back pain may worsen; this is typically an indication of progress in your rehabilitation. Be certain to be very alert to any changes in your symptoms and the activities in which you participated in the 24 hours prior to the change. Sharing this information with your clinician will allow him/her to most efficiently treat your condition. These exercises may help you when beginning to rehabilitate your injury. Your symptoms may resolve with or without further involvement from your physician, physical therapist or athletic trainer. While completing these exercises, remember:   Restoring tissue flexibility helps normal motion to return to the joints. This allows healthier, less painful movement and activity.  An effective stretch should be held for at least 30 seconds.  A stretch should never be painful. You should only feel a gentle lengthening or release in the stretched tissue. FLEXION RANGE OF MOTION AND STRETCHING EXERCISES: STRETCH - Flexion, Single Knee to Chest   Lie on a firm bed or floor  with both legs extended in front of you.  Keeping one leg in contact with the floor, bring your opposite knee to your chest. Hold your leg in place by either grabbing behind your thigh or at your knee.  Pull until you feel a gentle stretch in your low back. Hold __________ seconds.  Slowly release your grasp and repeat the exercise with the opposite side. Repeat __________ times. Complete this exercise __________ times per day.  STRETCH - Flexion, Double Knee to Chest  Lie on a firm bed or floor with both legs extended in front of you.  Keeping one leg in contact with the floor, bring your opposite knee to your chest.  Tense your stomach muscles to support your back and then lift your other knee to your chest. Hold your legs in place by either grabbing behind your thighs or at your knees.  Pull both knees toward your chest until you feel a gentle stretch in your low back. Hold __________ seconds.  Tense your stomach muscles and slowly return one leg at a time to the floor. Repeat __________ times. Complete this exercise __________ times per day.  STRETCH - Low Trunk Rotation   Lie on a firm bed or floor. Keeping your legs in front of you, bend your knees so they are both pointed toward the ceiling and your feet are flat on the floor.  Extend your arms out to the side. This will stabilize your upper body by keeping your shoulders in contact with the floor.  Gently and slowly drop both knees together to one side until   you feel a gentle stretch in your low back. Hold for __________ seconds.  Tense your stomach muscles to support your low back as you bring your knees back to the starting position. Repeat the exercise to the other side. Repeat __________ times. Complete this exercise __________ times per day  EXTENSION RANGE OF MOTION AND FLEXIBILITY EXERCISES: STRETCH - Extension, Prone on Elbows  Lie on your stomach on the floor, a bed will be too soft. Place your palms about shoulder  width apart and at the height of your head.  Place your elbows under your shoulders. If this is too painful, stack pillows under your chest.  Allow your body to relax so that your hips drop lower and make contact more completely with the floor.  Hold this position for __________ seconds.  Slowly return to lying flat on the floor. Repeat __________ times. Complete this exercise __________ times per day.  RANGE OF MOTION - Extension, Prone Press Ups  Lie on your stomach on the floor, a bed will be too soft. Place your palms about shoulder width apart and at the height of your head.  Keeping your back as relaxed as possible, slowly straighten your elbows while keeping your hips on the floor. You may adjust the placement of your hands to maximize your comfort. As you gain motion, your hands will come more underneath your shoulders.  Hold this position __________ seconds.  Slowly return to lying flat on the floor. Repeat __________ times. Complete this exercise __________ times per day.  STRENGTHENING EXERCISES - Sciatica  These exercises may help you when beginning to rehabilitate your injury. These exercises should be done near your "sweet spot." This is the neutral, low-back arch, somewhere between fully rounded and fully arched, that is your least painful position. When performed in this safe range of motion, these exercises can be used for people who have either a flexion or extension based injury. These exercises may resolve your symptoms with or without further involvement from your physician, physical therapist or athletic trainer. While completing these exercises, remember:   Muscles can gain both the endurance and the strength needed for everyday activities through controlled exercises.  Complete these exercises as instructed by your physician, physical therapist or athletic trainer. Progress with the resistance and repetition exercises only as your caregiver advises.  You may  experience muscle soreness or fatigue, but the pain or discomfort you are trying to eliminate should never worsen during these exercises. If this pain does worsen, stop and make certain you are following the directions exactly. If the pain is still present after adjustments, discontinue the exercise until you can discuss the trouble with your clinician. STRENGTHENING - Deep Abdominals, Pelvic Tilt   Lie on a firm bed or floor. Keeping your legs in front of you, bend your knees so they are both pointed toward the ceiling and your feet are flat on the floor.  Tense your lower abdominal muscles to press your low back into the floor. This motion will rotate your pelvis so that your tail bone is scooping upwards rather than pointing at your feet or into the floor.  With a gentle tension and even breathing, hold this position for __________ seconds. Repeat __________ times. Complete this exercise __________ times per day.  STRENGTHENING - Abdominals, Crunches   Lie on a firm bed or floor. Keeping your legs in front of you, bend your knees so they are both pointed toward the ceiling and your feet are flat on the   floor. Cross your arms over your chest.  Slightly tip your chin down without bending your neck.  Tense your abdominals and slowly lift your trunk high enough to just clear your shoulder blades. Lifting higher can put excessive stress on the low back and does not further strengthen your abdominal muscles.  Control your return to the starting position. Repeat __________ times. Complete this exercise __________ times per day.  STRENGTHENING - Quadruped, Opposite UE/LE Lift  Assume a hands and knees position on a firm surface. Keep your hands under your shoulders and your knees under your hips. You may place padding under your knees for comfort.  Find your neutral spine and gently tense your abdominal muscles so that you can maintain this position. Your shoulders and hips should form a rectangle  that is parallel with the floor and is not twisted.  Keeping your trunk steady, lift your right hand no higher than your shoulder and then your left leg no higher than your hip. Make sure you are not holding your breath. Hold this position __________ seconds.  Continuing to keep your abdominal muscles tense and your back steady, slowly return to your starting position. Repeat with the opposite arm and leg. Repeat __________ times. Complete this exercise __________ times per day.  STRENGTHENING - Abdominals and Quadriceps, Straight Leg Raise   Lie on a firm bed or floor with both legs extended in front of you.  Keeping one leg in contact with the floor, bend the other knee so that your foot can rest flat on the floor.  Find your neutral spine, and tense your abdominal muscles to maintain your spinal position throughout the exercise.  Slowly lift your straight leg off the floor about 6 inches for a count of 15, making sure to not hold your breath.  Still keeping your neutral spine, slowly lower your leg all the way to the floor. Repeat this exercise with each leg __________ times. Complete this exercise __________ times per day. POSTURE AND BODY MECHANICS CONSIDERATIONS - Sciatica Keeping correct posture when sitting, standing or completing your activities will reduce the stress put on different body tissues, allowing injured tissues a chance to heal and limiting painful experiences. The following are general guidelines for improved posture. Your physician or physical therapist will provide you with any instructions specific to your needs. While reading these guidelines, remember:  The exercises prescribed by your provider will help you have the flexibility and strength to maintain correct postures.  The correct posture provides the optimal environment for your joints to work. All of your joints have less wear and tear when properly supported by a spine with good posture. This means you will  experience a healthier, less painful body.  Correct posture must be practiced with all of your activities, especially prolonged sitting and standing. Correct posture is as important when doing repetitive low-stress activities (typing) as it is when doing a single heavy-load activity (lifting). RESTING POSITIONS Consider which positions are most painful for you when choosing a resting position. If you have pain with flexion-based activities (sitting, bending, stooping, squatting), choose a position that allows you to rest in a less flexed posture. You would want to avoid curling into a fetal position on your side. If your pain worsens with extension-based activities (prolonged standing, working overhead), avoid resting in an extended position such as sleeping on your stomach. Most people will find more comfort when they rest with their spine in a more neutral position, neither too rounded nor too   arched. Lying on a non-sagging bed on your side with a pillow between your knees, or on your back with a pillow under your knees will often provide some relief. Keep in mind, being in any one position for a prolonged period of time, no matter how correct your posture, can still lead to stiffness. PROPER SITTING POSTURE In order to minimize stress and discomfort on your spine, you must sit with correct posture Sitting with good posture should be effortless for a healthy body. Returning to good posture is a gradual process. Many people can work toward this most comfortably by using various supports until they have the flexibility and strength to maintain this posture on their own. When sitting with proper posture, your ears will fall over your shoulders and your shoulders will fall over your hips. You should use the back of the chair to support your upper back. Your low back will be in a neutral position, just slightly arched. You may place a small pillow or folded towel at the base of your low back for support.  When  working at a desk, create an environment that supports good, upright posture. Without extra support, muscles fatigue and lead to excessive strain on joints and other tissues. Keep these recommendations in mind: CHAIR:   A chair should be able to slide under your desk when your back makes contact with the back of the chair. This allows you to work closely.  The chair's height should allow your eyes to be level with the upper part of your monitor and your hands to be slightly lower than your elbows. BODY POSITION  Your feet should make contact with the floor. If this is not possible, use a foot rest.  Keep your ears over your shoulders. This will reduce stress on your neck and low back. INCORRECT SITTING POSTURES   If you are feeling tired and unable to assume a healthy sitting posture, do not slouch or slump. This puts excessive strain on your back tissues, causing more damage and pain. Healthier options include:  Using more support, like a lumbar pillow.  Switching tasks to something that requires you to be upright or walking.  Talking a brief walk.  Lying down to rest in a neutral-spine position. PROLONGED STANDING WHILE SLIGHTLY LEANING FORWARD  When completing a task that requires you to lean forward while standing in one place for a long time, place either foot up on a stationary 2-4 inch high object to help maintain the best posture. When both feet are on the ground, the low back tends to lose its slight inward curve. If this curve flattens (or becomes too large), then the back and your other joints will experience too much stress, fatigue more quickly and can cause pain.  CORRECT STANDING POSTURES Proper standing posture should be assumed with all daily activities, even if they only take a few moments, like when brushing your teeth. As in sitting, your ears should fall over your shoulders and your shoulders should fall over your hips. You should keep a slight tension in your abdominal  muscles to brace your spine. Your tailbone should point down to the ground, not behind your body, resulting in an over-extended swayback posture.  INCORRECT STANDING POSTURES  Common incorrect standing postures include a forward head, locked knees and/or an excessive swayback. WALKING Walk with an upright posture. Your ears, shoulders and hips should all line-up. PROLONGED ACTIVITY IN A FLEXED POSITION When completing a task that requires you to bend forward   at your waist or lean over a low surface, try to find a way to stabilize 3 of 4 of your limbs. You can place a hand or elbow on your thigh or rest a knee on the surface you are reaching across. This will provide you more stability so that your muscles do not fatigue as quickly. By keeping your knees relaxed, or slightly bent, you will also reduce stress across your low back. CORRECT LIFTING TECHNIQUES DO :   Assume a wide stance. This will provide you more stability and the opportunity to get as close as possible to the object which you are lifting.  Tense your abdominals to brace your spine; then bend at the knees and hips. Keeping your back locked in a neutral-spine position, lift using your leg muscles. Lift with your legs, keeping your back straight.  Test the weight of unknown objects before attempting to lift them.  Try to keep your elbows locked down at your sides in order get the best strength from your shoulders when carrying an object.  Always ask for help when lifting heavy or awkward objects. INCORRECT LIFTING TECHNIQUES DO NOT:   Lock your knees when lifting, even if it is a small object.  Bend and twist. Pivot at your feet or move your feet when needing to change directions.  Assume that you cannot safely pick up a paperclip without proper posture.   This information is not intended to replace advice given to you by your health care provider. Make sure you discuss any questions you have with your health care provider.     Document Released: 10/28/2005 Document Revised: 03/14/2015 Document Reviewed: 02/09/2009 [employer] Interactive Patient Education 2016 [employer]  

## 2016-01-25 LAB — COMPREHENSIVE METABOLIC PANEL
ALK PHOS: 85 U/L (ref 33–130)
ALT: 80 U/L — AB (ref 6–29)
AST: 61 U/L — ABNORMAL HIGH (ref 10–35)
Albumin: 4.2 g/dL (ref 3.6–5.1)
BUN: 19 mg/dL (ref 7–25)
CHLORIDE: 99 mmol/L (ref 98–110)
CO2: 27 mmol/L (ref 20–31)
Calcium: 9.7 mg/dL (ref 8.6–10.4)
Creat: 0.79 mg/dL (ref 0.50–1.05)
GLUCOSE: 118 mg/dL — AB (ref 65–99)
POTASSIUM: 4.5 mmol/L (ref 3.5–5.3)
Sodium: 136 mmol/L (ref 135–146)
Total Bilirubin: 0.3 mg/dL (ref 0.2–1.2)
Total Protein: 7.7 g/dL (ref 6.1–8.1)

## 2016-01-25 MED FILL — FLUCONAZOLE 150 MG TABLET: 150 | 1 days supply | Qty: 2 | Fill #0

## 2016-01-25 MED FILL — BENZONATATE 100 MG CAPSULE: 100 | 7 days supply | Qty: 40 | Fill #0

## 2016-01-25 MED FILL — AMOX TR-K CLV 875-125 MG TA: 875-125 | 10 days supply | Qty: 20 | Fill #0

## 2016-01-26 LAB — URINE CULTURE
Colony Count: NO GROWTH
Organism ID, Bacteria: NO GROWTH

## 2016-02-07 ENCOUNTER — Other Ambulatory Visit: Payer: Self-pay | Admitting: Family Medicine

## 2016-02-07 MED FILL — SERTRALINE HCL 100 MG TAB: 100 | 30 days supply | Qty: 45 | Fill #3

## 2016-02-07 MED FILL — PHENTERMINE 37.5 MG TABLET: 37.5 | 30 days supply | Qty: 30 | Fill #1

## 2016-02-07 MED FILL — rOPINIRole HCL 1 MG TABS: 1 | 90 days supply | Qty: 90 | Fill #3

## 2016-02-08 ENCOUNTER — Encounter: Payer: Self-pay | Admitting: Family Medicine

## 2016-02-08 MED FILL — MONTELUKAST SOD 10 MG TAB: 10 | 90 days supply | Qty: 90 | Fill #0

## 2016-02-08 MED FILL — LISINOPRIL 5 MG TABLET: 5 | 90 days supply | Qty: 90 | Fill #0

## 2016-02-09 ENCOUNTER — Telehealth: Payer: Self-pay

## 2016-02-09 NOTE — Telephone Encounter (Signed)
Patient needs FMLA forms completed by Dr Tamala Julian for her sinus issues because she has missed so much work do to them, I have filled them out the best that I can after looking through her chart to get the information. I highlighted the areas I was not sure about and I will place the forms in your box on 02/09/16, if you could please return them to the FMLA/Disability box at the [employer] checkout desk within 5-7 business days. Thank you!

## 2016-02-12 ENCOUNTER — Encounter: Payer: Self-pay | Admitting: Family Medicine

## 2016-02-12 ENCOUNTER — Telehealth: Payer: Self-pay

## 2016-02-12 NOTE — Telephone Encounter (Signed)
Pt is checking on the status of her FMLA paperwork. She needs Dr.Smith to fill it as soon as possible. She also sent a message in on mychart.  Please advise  [phone]

## 2016-02-14 NOTE — Telephone Encounter (Signed)
Paperwork is in Dr. Thompson Caul box it was received on 02/09/16 and placed in her box on the same day she has 5-7 business days to complete today (02/14/16) is only the 3rd business day.

## 2016-02-16 ENCOUNTER — Other Ambulatory Visit: Payer: Self-pay | Admitting: Family Medicine

## 2016-02-16 DIAGNOSIS — J301 Allergic rhinitis due to pollen: Secondary | ICD-10-CM

## 2016-02-19 ENCOUNTER — Ambulatory Visit (INDEPENDENT_AMBULATORY_CARE_PROVIDER_SITE_OTHER): Payer: 59 | Admitting: Family Medicine

## 2016-02-19 ENCOUNTER — Encounter: Payer: Self-pay | Admitting: Family Medicine

## 2016-02-19 VITALS — BP 100/58 | Ht 60.0 in | Wt 227.0 lb

## 2016-02-19 DIAGNOSIS — M5432 Sciatica, left side: Secondary | ICD-10-CM | POA: Diagnosis not present

## 2016-02-19 NOTE — Telephone Encounter (Signed)
Forms completed; will place on disability desk later today (as out of office currently).

## 2016-02-20 NOTE — Progress Notes (Signed)
   Subjective:    Patient ID: Cristina [name], female    DOB: 1961-09-14, 55 y.o.   MRN: [id number]  HPI Chief complaint: Low back pain with radiation down into her thigh on the right. She occasionally has radiation into the left but more often on the right.  History of present illness: History of herniated disc in the lumbar spine. She had MRI done about 15 years ago at an outside facility. She is intermittently had problems off and on over the years but in the last 2-3 months her pain has become daily. Pain is mostly in the mid low back although radiates to both sides, more frequently to the right side down into the right thigh and occasionally shoots down to the sole of the foot. She does not have leg weakness. She has not had bowel or bladder incontinence. Pain can be 4-7 out of 10, worse with standing, sometimes relieved with rest. It does not awaken her from night. Over-the-counter medications have not helped.  PERTINENT  PMH / PSH: I have reviewed the patient's medications, allergies, past medical and surgical history. Pertinent findings that relate to today's visit / issues include: Obesity, diabetes mellitus, obstructive sleep apnea No prior history of low back surgery   Review of Systems She denies unusual weight change, fever, sweats, chills. See history of present illness above for additional pertinent review of systems.    Objective:   Physical Exam Vital signs are reviewed GENERAL: Well-developed overweight female no acute distress HIPS: Full range of motion internal and external rotation. Nonpainful. BACK: Nontender to palpation in the lower thoracic or lumbar area. The right SI joint area is mildly tender to palpation at the area of the iliac crest. There is no defect noted in the back or vertebra. She can flex at the hips to the 100, limited mostly by hamstring tightness. She can hyperextend 5-10 and this does increase her pain somewhat causing radiation into the right posterior  thigh. Straight leg raise positive in seated position on the right negative and supine. Crossed straight leg raise negative. EXTREMITY: Lower extremity strength in hip flexion extension, knee flexion extension, dorsiflexion plantarflexion 5 out of 5 and symmetrical. NEURO: DTRs at the knee and ankle 1-2+ bilaterally symmetrical. SKIN: Area on the low back reveals no ecchymoses, no rash       Assessment & Plan:

## 2016-02-20 NOTE — Assessment & Plan Note (Signed)
I have no imaging of her low back other than some plain films done in July 2014 which showed some degenerative changes. Given her history and her clinical complaints, I think we need to eval w MRI and we will set that up. I will discuss results with her by phone.

## 2016-02-20 NOTE — Telephone Encounter (Signed)
Paperwork was scanned and faxed to [employer] on 02/20/16 will call patient to let her know.

## 2016-02-24 ENCOUNTER — Inpatient Hospital Stay: Admission: RE | Admit: 2016-02-24 | Payer: 59 | Source: Ambulatory Visit

## 2016-02-29 ENCOUNTER — Ambulatory Visit
Admission: RE | Admit: 2016-02-29 | Discharge: 2016-02-29 | Disposition: A | Discharge status: Home / Self Care | Payer: 59 | Source: Ambulatory Visit | Attending: Family Medicine | Admitting: Family Medicine

## 2016-02-29 DIAGNOSIS — M5126 Other intervertebral disc displacement, lumbar region: Secondary | ICD-10-CM | POA: Diagnosis not present

## 2016-02-29 DIAGNOSIS — M5432 Sciatica, left side: Secondary | ICD-10-CM

## 2016-03-04 ENCOUNTER — Encounter: Payer: 59 | Admitting: Family Medicine

## 2016-03-04 ENCOUNTER — Encounter: Payer: Self-pay | Admitting: Family Medicine

## 2016-03-04 ENCOUNTER — Telehealth: Payer: Self-pay | Admitting: Family Medicine

## 2016-03-04 NOTE — Telephone Encounter (Signed)
Cristina [name] u call and tell her that MRI shows a little bit of a new disc bulge on the right. Probably causing her symptoms. She basically has 3 choices: 1. If she has had some improvement on gabapentin we [name] just taper that up. 2. I [name] refer her for epidural steroid injection (this option most likely one to help with least down time) 3. Refer to back surgeon for evaluation  Also she [name] combine any of those options as well.  I am sending a full report --copy of her MRI--via mail  Let me know THANKS! Dorcas Mcmurray

## 2016-03-05 ENCOUNTER — Other Ambulatory Visit: Payer: Self-pay | Admitting: Family Medicine

## 2016-03-05 NOTE — Telephone Encounter (Signed)
Spoke to Cristina [name] and she says she has never taken Gabapentin. She wasn't too keen on the idea of injection or surgery, so she will discuss other options at her appt with you this Friday.

## 2016-03-06 ENCOUNTER — Encounter: Payer: Self-pay | Admitting: Family Medicine

## 2016-03-06 MED FILL — ACYCLOVIR 400 MG TABLET: 400 | 90 days supply | Qty: 180 | Fill #0

## 2016-03-06 MED FILL — SIMVASTATIN 40 MG TABLET: 40 | 90 days supply | Qty: 90 | Fill #0

## 2016-03-06 MED FILL — JANUVIA 100 MG TABLET: 100 | 90 days supply | Qty: 90 | Fill #0

## 2016-03-06 NOTE — Telephone Encounter (Signed)
Patient has a scheduled appointment in may.

## 2016-03-07 ENCOUNTER — Other Ambulatory Visit: Payer: Self-pay | Admitting: Family Medicine

## 2016-03-07 MED FILL — ZOLPIDEM TARTRATE 5 MG TAB: 5 | 30 days supply | Qty: 30 | Fill #0

## 2016-03-07 NOTE — Telephone Encounter (Signed)
Please CALL IN refill of Ambien as approved.

## 2016-03-08 ENCOUNTER — Ambulatory Visit: Payer: 59 | Admitting: Family Medicine

## 2016-03-11 NOTE — Telephone Encounter (Signed)
Called in.

## 2016-03-13 DIAGNOSIS — J309 Allergic rhinitis, unspecified: Secondary | ICD-10-CM | POA: Diagnosis not present

## 2016-03-13 DIAGNOSIS — H1045 Other chronic allergic conjunctivitis: Secondary | ICD-10-CM | POA: Diagnosis not present

## 2016-03-13 DIAGNOSIS — R05 Cough: Secondary | ICD-10-CM | POA: Diagnosis not present

## 2016-03-13 MED FILL — LEVOCETIRIZINE 5 MG TABLET: 5 | 30 days supply | Qty: 30 | Fill #0

## 2016-03-13 MED FILL — AZELASTINE HCL 0.05% DROPS: 0.05 | 30 days supply | Qty: 6 | Fill #0

## 2016-03-21 ENCOUNTER — Other Ambulatory Visit: Payer: Self-pay | Admitting: Family Medicine

## 2016-03-22 ENCOUNTER — Ambulatory Visit: Payer: 59 | Admitting: Family Medicine

## 2016-03-22 MED FILL — glipiZIDE XL 10 MG TB24: 10 | 90 days supply | Qty: 90 | Fill #0

## 2016-03-22 MED FILL — PANTOPRAZOLE SOD DR 40 MG T: 40 | 90 days supply | Qty: 180 | Fill #0

## 2016-03-29 MED FILL — BUPROPION HCL SR 100 MG TAB: 100 | 90 days supply | Qty: 90 | Fill #2

## 2016-03-29 MED FILL — SERTRALINE HCL 100 MG TAB: 100 | 30 days supply | Qty: 45 | Fill #4

## 2016-04-02 ENCOUNTER — Encounter: Payer: 59 | Admitting: Family Medicine

## 2016-04-11 DIAGNOSIS — J039 Acute tonsillitis, unspecified: Secondary | ICD-10-CM | POA: Diagnosis not present

## 2016-04-11 DIAGNOSIS — R05 Cough: Secondary | ICD-10-CM | POA: Diagnosis not present

## 2016-04-11 DIAGNOSIS — R0981 Nasal congestion: Secondary | ICD-10-CM | POA: Diagnosis not present

## 2016-04-11 DIAGNOSIS — R509 Fever, unspecified: Secondary | ICD-10-CM | POA: Diagnosis not present

## 2016-04-16 ENCOUNTER — Other Ambulatory Visit: Payer: Self-pay | Admitting: Family Medicine

## 2016-04-16 MED FILL — ZOLPIDEM TARTRATE 5 MG TAB: 5 | 30 days supply | Qty: 30 | Fill #1

## 2016-04-16 MED FILL — LEVOCETIRIZINE 5 MG TABLET: 5 | 30 days supply | Qty: 30 | Fill #1

## 2016-04-24 ENCOUNTER — Encounter: Payer: Self-pay | Admitting: *Deleted

## 2016-04-24 NOTE — Patient Instructions (Signed)
[facility] [address] Louanne Skye Wednesday July 19th at [place], [place], [place] [address] Phone: [phone]

## 2016-05-04 ENCOUNTER — Ambulatory Visit (INDEPENDENT_AMBULATORY_CARE_PROVIDER_SITE_OTHER): Payer: 59 | Admitting: Family Medicine

## 2016-05-04 ENCOUNTER — Ambulatory Visit (INDEPENDENT_AMBULATORY_CARE_PROVIDER_SITE_OTHER): Payer: 59

## 2016-05-04 VITALS — BP 120/80 | HR 97 | Temp 98.8°F | Resp 18 | Ht 60.0 in | Wt 227.5 lb

## 2016-05-04 DIAGNOSIS — D509 Iron deficiency anemia, unspecified: Secondary | ICD-10-CM | POA: Diagnosis not present

## 2016-05-04 DIAGNOSIS — Z23 Encounter for immunization: Secondary | ICD-10-CM

## 2016-05-04 DIAGNOSIS — Z0000 Encounter for general adult medical examination without abnormal findings: Secondary | ICD-10-CM | POA: Diagnosis not present

## 2016-05-04 DIAGNOSIS — E7800 Pure hypercholesterolemia, unspecified: Secondary | ICD-10-CM

## 2016-05-04 DIAGNOSIS — F32A Depression, unspecified: Secondary | ICD-10-CM

## 2016-05-04 DIAGNOSIS — G2581 Restless legs syndrome: Secondary | ICD-10-CM | POA: Diagnosis not present

## 2016-05-04 DIAGNOSIS — M79642 Pain in left hand: Secondary | ICD-10-CM | POA: Diagnosis not present

## 2016-05-04 DIAGNOSIS — N898 Other specified noninflammatory disorders of vagina: Secondary | ICD-10-CM | POA: Diagnosis not present

## 2016-05-04 DIAGNOSIS — G4733 Obstructive sleep apnea (adult) (pediatric): Secondary | ICD-10-CM

## 2016-05-04 DIAGNOSIS — E1142 Type 2 diabetes mellitus with diabetic polyneuropathy: Secondary | ICD-10-CM

## 2016-05-04 DIAGNOSIS — Z9989 Dependence on other enabling machines and devices: Secondary | ICD-10-CM

## 2016-05-04 DIAGNOSIS — M797 Fibromyalgia: Secondary | ICD-10-CM

## 2016-05-04 DIAGNOSIS — E669 Obesity, unspecified: Secondary | ICD-10-CM

## 2016-05-04 DIAGNOSIS — G4700 Insomnia, unspecified: Secondary | ICD-10-CM | POA: Diagnosis not present

## 2016-05-04 DIAGNOSIS — F329 Major depressive disorder, single episode, unspecified: Secondary | ICD-10-CM

## 2016-05-04 DIAGNOSIS — M19042 Primary osteoarthritis, left hand: Secondary | ICD-10-CM | POA: Diagnosis not present

## 2016-05-04 DIAGNOSIS — N3946 Mixed incontinence: Secondary | ICD-10-CM

## 2016-05-04 DIAGNOSIS — J301 Allergic rhinitis due to pollen: Secondary | ICD-10-CM | POA: Diagnosis not present

## 2016-05-04 LAB — LIPID PANEL
Cholesterol: 193 mg/dL (ref 125–200)
HDL: 47 mg/dL (ref >=46)
LDL Cholesterol: 119 mg/dL (ref <130)
Total CHOL/HDL Ratio: 4.1 Ratio (ref <=5.0)
Triglycerides: 136 mg/dL (ref <150)
VLDL: 27 mg/dL (ref <30)

## 2016-05-04 LAB — COMPREHENSIVE METABOLIC PANEL
ALK PHOS: 74 U/L (ref 33–130)
ALT: 79 U/L — AB (ref 6–29)
AST: 77 U/L — AB (ref 10–35)
Albumin: 4.1 g/dL (ref 3.6–5.1)
BILIRUBIN TOTAL: 0.3 mg/dL (ref 0.2–1.2)
BUN: 13 mg/dL (ref 7–25)
CALCIUM: 9.1 mg/dL (ref 8.6–10.4)
CO2: 24 mmol/L (ref 20–31)
CREATININE: 0.79 mg/dL (ref 0.50–1.05)
Chloride: 105 mmol/L (ref 98–110)
GLUCOSE: 153 mg/dL — AB (ref 65–99)
Potassium: 4.1 mmol/L (ref 3.5–5.3)
SODIUM: 139 mmol/L (ref 135–146)
Total Protein: 7.5 g/dL (ref 6.1–8.1)

## 2016-05-04 LAB — CBC WITH DIFFERENTIAL/PLATELET
Basophils Absolute: 0 cells/uL (ref 0–200)
Basophils Relative: 0 %
EOS PCT: 2 %
Eosinophils Absolute: 242 cells/uL (ref 15–500)
HCT: 40.6 % (ref 35.0–45.0)
HEMOGLOBIN: 13.2 g/dL (ref 11.7–15.5)
LYMPHS ABS: 4235 {cells}/uL — AB (ref 850–3900)
Lymphocytes Relative: 35 %
MCH: 27.3 pg (ref 27.0–33.0)
MCHC: 32.5 g/dL (ref 32.0–36.0)
MCV: 83.9 fL (ref 80.0–100.0)
MPV: 9.6 fL (ref 7.5–12.5)
Monocytes Absolute: 605 cells/uL (ref 200–950)
Monocytes Relative: 5 %
NEUTROS ABS: 7018 {cells}/uL (ref 1500–7800)
NEUTROS PCT: 58 %
PLATELETS: 312 10*3/uL (ref 140–400)
RBC: 4.84 MIL/uL (ref 3.80–5.10)
RDW: 15.9 % — ABNORMAL HIGH (ref 11.0–15.0)
WBC: 12.1 10*3/uL — AB (ref 3.8–10.8)

## 2016-05-04 LAB — POCT WET + KOH PREP
TRICH BY WET PREP: ABSENT
YEAST BY KOH: ABSENT
YEAST BY WET PREP: ABSENT

## 2016-05-04 LAB — TSH: TSH: 1.78 m[IU]/L

## 2016-05-04 LAB — POCT URINALYSIS DIP (MANUAL ENTRY)
BILIRUBIN UA: NEGATIVE
BILIRUBIN UA: NEGATIVE
Glucose, UA: 500 — AB
Leukocytes, UA: NEGATIVE
Nitrite, UA: NEGATIVE
PH UA: 5.5
Protein Ur, POC: NEGATIVE
RBC UA: NEGATIVE
SPEC GRAV UA: 1.02
Urobilinogen, UA: 0.2

## 2016-05-04 LAB — POC MICROSCOPIC URINALYSIS (UMFC): Mucus: ABSENT

## 2016-05-04 LAB — MICROALBUMIN, URINE: Microalb, Ur: 2.2 mg/dL — ABNORMAL HIGH

## 2016-05-04 MED ORDER — CANAGLIFLOZIN 300 MG PO TABS: 300.0000 mg | ORAL_TABLET | Freq: Every day | ORAL | Status: DC

## 2016-05-04 MED ORDER — LISINOPRIL 5 MG PO TABS: 5.0000 mg | ORAL_TABLET | Freq: Every day | ORAL | Status: DC

## 2016-05-04 MED ORDER — SIMVASTATIN 40 MG PO TABS: ORAL_TABLET | ORAL | Status: DC

## 2016-05-04 MED ORDER — AZELASTINE HCL 0.1 % NA SOLN: NASAL | Status: DC

## 2016-05-04 MED ORDER — SERTRALINE HCL 100 MG PO TABS: ORAL_TABLET | ORAL | Status: DC

## 2016-05-04 MED ORDER — ROPINIROLE HCL 1 MG PO TABS: ORAL_TABLET | ORAL | Status: DC

## 2016-05-04 MED ORDER — FLUTICASONE PROPIONATE 50 MCG/ACT NA SUSP: NASAL | Status: DC

## 2016-05-04 MED ORDER — SITAGLIPTIN PHOSPHATE 100 MG PO TABS: ORAL_TABLET | ORAL | Status: DC

## 2016-05-04 MED ORDER — ACYCLOVIR 400 MG PO TABS: ORAL_TABLET | ORAL | Status: DC

## 2016-05-04 MED ORDER — MONTELUKAST SODIUM 10 MG PO TABS: 10.0000 mg | ORAL_TABLET | Freq: Every day | ORAL | Status: DC

## 2016-05-04 MED ORDER — ALBUTEROL SULFATE HFA 108 (90 BASE) MCG/ACT IN AERS: 2.0000 | INHALATION_SPRAY | Freq: Four times a day (QID) | RESPIRATORY_TRACT | Status: DC | PRN

## 2016-05-04 MED ORDER — GLIPIZIDE ER 10 MG PO TB24
ORAL_TABLET | ORAL | Status: DC
Start: 2016-05-04 — End: 2017-05-21

## 2016-05-04 MED ORDER — PANTOPRAZOLE SODIUM 40 MG PO TBEC: DELAYED_RELEASE_TABLET | ORAL | Status: DC

## 2016-05-04 MED ORDER — METFORMIN HCL 1000 MG PO TABS: 1000.0000 mg | ORAL_TABLET | Freq: Two times a day (BID) | ORAL | Status: DC

## 2016-05-04 MED ORDER — BUPROPION HCL ER (SR) 100 MG PO TB12: 100.0000 mg | ORAL_TABLET | Freq: Every day | ORAL | Status: DC

## 2016-05-04 NOTE — Patient Instructions (Addendum)
   IF you received an x-ray today, you will receive an invoice from [facility]. Please contact [facility] at [phone] with questions or concerns regarding your invoice.   IF you received labwork today, you will receive an invoice from [facility]. Please contact [facility] at [phone] with questions or concerns regarding your invoice.   Our billing staff will not be able to assist you with questions regarding bills from these companies.  You will be contacted with the lab results as soon as they are available. The fastest way to get your results is to activate your My Chart account. Instructions are located on the last page of this paperwork. If you have not heard from us regarding the results in 2 weeks, please contact this office.    Keeping You Healthy  Get These Tests  Blood Pressure- Have your blood pressure checked by your healthcare provider at least once a year.  Normal blood pressure is 120/80.  Weight- Have your body mass index (BMI) calculated to screen for obesity.  BMI is a measure of body fat based on height and weight.  You can calculate your own BMI at www.nhlbisupport.com/bmi/  Cholesterol- Have your cholesterol checked every year.  Diabetes- Have your blood sugar checked every year if you have high blood pressure, high cholesterol, a family history of diabetes or if you are overweight.  Pap Test - Have a pap test every 1 to 5 years if you have been sexually active.  If you are older than 65 and recent pap tests have been normal you may not need additional pap tests.  In addition, if you have had a hysterectomy  for benign disease additional pap tests are not necessary.  Mammogram-Yearly mammograms are essential for early detection of breast cancer  Screening for Colon Cancer- Colonoscopy starting at age 50. Screening may begin sooner depending on your family history and other health conditions.  Follow up colonoscopy  as directed by your Gastroenterologist.  Screening for Osteoporosis- Screening begins at age 65 with bone density scanning, sooner if you are at higher risk for developing Osteoporosis.  Get these medicines  Calcium with Vitamin D- Your body requires 1200-1500 mg of Calcium a day and 800-1000 IU of Vitamin D a day.  You can only absorb 500 mg of Calcium at a time therefore Calcium must be taken in 2 or 3 separate doses throughout the day.  Hormones- Hormone therapy has been associated with increased risk for certain cancers and heart disease.  Talk to your healthcare provider about if you need relief from menopausal symptoms.  Aspirin- Ask your healthcare provider about taking Aspirin to prevent Heart Disease and Stroke.  Get these Immuniztions  Flu shot- Every fall  Pneumonia shot- Once after the age of 65; if you are younger ask your healthcare provider if you need a pneumonia shot.  Tetanus- Every ten years.  Zostavax- Once after the age of 60 to prevent shingles.  Take these steps  Don't smoke- Your healthcare provider can help you quit. For tips on how to quit, ask your healthcare provider or go to www.smokefree.gov or call 1-800 QUIT-NOW.  Be physically active- Exercise 5 days a week for a minimum of 30 minutes.  If you are not already physically active, start slow and gradually work up to 30 minutes of moderate physical activity.  Try walking, dancing, bike riding, swimming, etc.  Eat a healthy diet- Eat a variety of healthy foods such as fruits, vegetables, whole   grains, low fat milk, low fat cheeses, yogurt, lean meats, chicken, fish, eggs, dried beans, tofu, etc.  For more information go to www.thenutritionsource.org  Dental visit- Brush and floss teeth twice daily; visit your dentist twice a year.  Eye exam- Visit your Optometrist or Ophthalmologist yearly.  Drink alcohol in moderation- Limit alcohol intake to one drink or less a day.  Never drink and  drive.  Depression- Your emotional health is as important as your physical health.  If you're feeling down or losing interest in things you normally enjoy, please talk to your healthcare provider.  Seat Belts- can save your life; always wear one  Smoke/Carbon Monoxide detectors- These detectors need to be installed on the appropriate level of your home.  Replace batteries at least once a year.  Violence- If anyone is threatening or hurting you, please tell your healthcare provider.  Living Will/ Health care power of attorney- Discuss with your healthcare provider and family. 

## 2016-05-04 NOTE — Progress Notes (Signed)
Subjective:    Patient ID: Cristina [name], female    DOB: 02/21/1961, 55 y.o.   MRN: [id number]  05/04/2016  Annual Exam   HPI This 55 y.o. female presents for Complete Physical Examination.  Last physical:  10/05/2014 Pap smear: s/p hysterectomy for DUB.  Mammogram:  01/12/2016 Colonoscopy: 06/2012 TDAP:  2011 Pneumovax:  2009 Zostavax: never Influenza:  2016 Eye exam:  12/2015 [facility] Dental exam:  Every six months.  R hand pain: at base of third digit.  No trauma; no swelling; painful only with palpation; no painful ROM.  No redness.   DMII: running high; drinking regular sodas.      Review of Systems  Constitutional: Negative for fever, chills, diaphoresis, activity change, appetite change, fatigue and unexpected weight change.  HENT: Negative for congestion, dental problem, drooling, ear discharge, ear pain, facial swelling, hearing loss, mouth sores, nosebleeds, postnasal drip, rhinorrhea, sinus pressure, sneezing, sore throat, tinnitus, trouble swallowing and voice change.   Eyes: Negative for photophobia, pain, discharge, redness, itching and visual disturbance.  Respiratory: Negative for apnea, cough, choking, chest tightness, shortness of breath, wheezing and stridor.   Cardiovascular: Negative for chest pain, palpitations and leg swelling.  Gastrointestinal: Negative for nausea, vomiting, abdominal pain, diarrhea, constipation, blood in stool, abdominal distention, anal bleeding and rectal pain.  Endocrine: Negative for cold intolerance, heat intolerance, polydipsia, polyphagia and polyuria.  Genitourinary: Negative for dysuria, urgency, frequency, hematuria, flank pain, decreased urine volume, vaginal bleeding, vaginal discharge, enuresis, difficulty urinating, genital sores, vaginal pain, menstrual problem, pelvic pain and dyspareunia.  Musculoskeletal: Positive for joint swelling and arthralgias. Negative for myalgias, back pain, gait problem, neck pain and neck stiffness.   Skin: Negative for color change, pallor, rash and wound.  Allergic/Immunologic: Negative for environmental allergies, food allergies and immunocompromised state.  Neurological: Negative for dizziness, tremors, seizures, syncope, facial asymmetry, speech difficulty, weakness, light-headedness, numbness and headaches.  Hematological: Negative for adenopathy. Does not bruise/bleed easily.  Psychiatric/Behavioral: Negative for suicidal ideas, hallucinations, behavioral problems, confusion, sleep disturbance, self-injury, dysphoric mood, decreased concentration and agitation. The patient is not nervous/anxious and is not hyperactive.     Past Medical History  Diagnosis Date  . Hemorrhoid   . Fissure, anal   . Cystocele   . Rectocele   . Diabetes mellitus   . Genital herpes   . Arthritis   . Depression   . Hyperlipidemia   . Obesity   . GERD (gastroesophageal reflux disease)   . CTS (carpal tunnel syndrome)   . Hx of migraine headaches   . Dysfunction of eustachian tube   . Constipation   . Allergic rhinitis, cause unspecified   . Iron deficiency anemia, unspecified   . Insomnia   . OSA on CPAP   . Hypertension   . Fatty liver    Past Surgical History  Procedure Laterality Date  . Cholecystectomy    . Carpal tunnel release  1989    Left  . Ectopic pregnancy surgery  1993  . Tubal ligation  1988  . Cholecystectomy    . Sleep study  09/11/2012    severe OSA.  CPAP titration at 12 cm water pressure.  . Abdominal hysterectomy  11/11/2010    [place] Plan.  Ovaries intact.  Uterine fibroids with DUB.  . Colonoscopy  06/29/2012    normal.  Repeat in 5 years.  . Esophagogastroduodenoscopy  12/13/2011    dysphagia.  Henrene Pastor.  Normal.   Allergies  Allergen Reactions  . Fish Oil Rash  Social History   Social History  . Marital Status: Single    Spouse Name: n/a  . Number of Children: 3  . Years of Education: college   Occupational History  . NURSE SECRETARY     [employer]   Social History Main Topics  . Smoking status: Former Research scientist (life sciences)  . Smokeless tobacco: Never Used     Comment: smoked occasionally longest 6 mos.  . Alcohol Use: No  . Drug Use: No  . Sexual Activity: No   Other Topics Concern  . Not on file   Social History Narrative      Marital status:  Divorced in 2000 after six years of marriage; not dating in 2017.      Children:  3 daughters (38, 64, 19); 6 grandchildren.      Lives: alone.  2 dogs; grandson stays some.      Employment:  Working at [employer]. [place] team.      Tobacco: none      Alcohol: socially; special occasions.  Rare.      Drugs: none      Exercise:  Sporadic; stationary bike.      Sexual activity:  Not sexually active since 2011.       Guns:  No guns in the home.       Smoke detectors in use,       Seatbelt:  Sometimes uses seat belts. 75% of time.  No texting while driving.     Family History  Problem Relation Age of Onset  . Hypertension Mother 65  . Osteoarthritis Mother   . Diabetes Mother   . Colon cancer Mother 63  . Cancer Mother 50    colon cancer  . Colon cancer Paternal Grandmother   . Cancer Paternal Grandmother   . Colon cancer Paternal Aunt   . Stroke Maternal Grandmother   . Colon polyps Brother     x 2 brother  . Diabetes Daughter   . Hypertension Daughter   . Sleep apnea Daughter   . Mental illness Daughter     depression  . Migraines Daughter   . Colon polyps Brother   . Cirrhosis Brother     alcoholism  . Alcohol abuse Brother   . Migraines Daughter   . Mental illness Daughter     anxiety attacks  . Protein S deficiency Daughter   . Sleep apnea Brother   . Hyperlipidemia Brother        Objective:    BP 120/80 mmHg  Pulse 97  Temp(Src) 98.8 F (37.1 C) (Oral)  Resp 18  Ht 5' (1.524 m)  Wt 227 lb 8 oz (103.193 kg)  BMI 44.43 kg/m2  SpO2 98% Physical Exam  Constitutional: She is oriented to person, place, and time. She appears  well-developed and well-nourished. No distress.  HENT:  Head: Normocephalic and atraumatic.  Right Ear: External ear normal.  Left Ear: External ear normal.  Nose: Nose normal.  Mouth/Throat: Oropharynx is clear and moist.  Eyes: Conjunctivae and EOM are normal. Pupils are equal, round, and reactive to light.  Neck: Normal range of motion and full passive range of motion without pain. Neck supple. No JVD present. Carotid bruit is not present. No thyromegaly present.  Cardiovascular: Normal rate, regular rhythm and normal heart sounds.  Exam reveals no gallop and no friction rub.   No murmur heard. Pulmonary/Chest: Effort normal and breath sounds normal. She has no wheezes. She has no rales.  Right breast exhibits no inverted nipple, no mass, no nipple discharge, no skin change and no tenderness. Left breast exhibits no inverted nipple, no mass, no nipple discharge, no skin change and no tenderness. Breasts are symmetrical.  Abdominal: Soft. Bowel sounds are normal. She exhibits no distension and no mass. There is no tenderness. There is no rebound and no guarding.  Genitourinary: Vagina normal. There is no rash, tenderness or lesion on the right labia. There is no rash, tenderness or lesion on the left labia. Right adnexum displays no mass, no tenderness and no fullness. Left adnexum displays no mass, no tenderness and no fullness.  Musculoskeletal:       Right shoulder: Normal.       Left shoulder: Normal.       Cervical back: Normal.  Lymphadenopathy:    She has no cervical adenopathy.  Neurological: She is alert and oriented to person, place, and time. She has normal reflexes. No cranial nerve deficit. She exhibits normal muscle tone. Coordination normal.  Skin: Skin is warm and dry. No rash noted. She is not diaphoretic. No erythema. No pallor.  Psychiatric: She has a normal mood and affect. Her behavior is normal. Judgment and thought content normal.  Nursing note and vitals  reviewed.   EKG: NSR; poor R wave progression; no change since 2014.  No results found.    Assessment & Plan:   1. Routine physical examination   2. Type 2 diabetes mellitus with diabetic polyneuropathy, without long-term current use of insulin (HCC)   3. Obesity   4. Fibromyalgia   5. Insomnia   6. Allergic rhinitis due to pollen   7. Depression   8. Restless leg syndrome   9. Iron deficiency anemia, unspecified   10. OSA on CPAP   11. Pure hypercholesterolemia   12. Left hand pain   13. Vaginal irritation   14. Mixed incontinence   15. Need for prophylactic vaccination against Streptococcus pneumoniae (pneumococcus)     Orders Placed This Encounter  Procedures  . Urine culture  . DG Hand Complete Left    Standing Status: Future     Number of Occurrences: 1     Standing Expiration Date: 05/04/2017    Order Specific Question:  Reason for Exam (SYMPTOM  OR DIAGNOSIS REQUIRED)    Answer:  L hand pain base of 3rd finger; no triggering    Order Specific Question:  Is the patient pregnant?    Answer:  No    Order Specific Question:  Preferred imaging location?    Answer:  External  . Pneumococcal polysaccharide vaccine 23-valent greater than or equal to 2yo subcutaneous/IM  . CBC with Differential/Platelet  . Hemoglobin A1c  . Lipid panel    Order Specific Question:  Has the patient fasted?    Answer:  Yes  . TSH  . Microalbumin, urine  . Comprehensive metabolic panel    Order Specific Question:  Has the patient fasted?    Answer:  Yes  . POCT urinalysis dipstick  . POCT Microscopic Urinalysis ([facility])  . POCT Wet + KOH Prep  . EKG 12-Lead   Meds ordered this encounter  Medications  . levocetirizine (XYZAL) 5 MG tablet    Sig:     Refill:  3  . azelastine (OPTIVAR) 0.05 % ophthalmic solution    Sig:     Refill:  3  . simvastatin (ZOCOR) 40 MG tablet    Sig: TAKE 1 TABLET BY MOUTH DAILY AT 6 PM.  Dispense:  90 tablet    Refill:  3  . sertraline (ZOLOFT) 100  MG tablet    Sig: TAKE 1 & 1/2 TABLET BY MOUTH DAILY    Dispense:  135 tablet    Refill:  3  . rOPINIRole (REQUIP) 1 MG tablet    Sig: TAKE 1 TABLET BY MOUTH EVERY NIGHT AT BEDTIME.    Dispense:  90 tablet    Refill:  3  . pantoprazole (PROTONIX) 40 MG tablet    Sig: TAKE 1 TABLET BY MOUTH 2 TIMES DAILY.    Dispense:  180 tablet    Refill:  3  . montelukast (SINGULAIR) 10 MG tablet    Sig: Take 1 tablet (10 mg total) by mouth at bedtime.    Dispense:  90 tablet    Refill:  3  . metFORMIN (GLUCOPHAGE) 1000 MG tablet    Sig: Take 1 tablet (1,000 mg total) by mouth 2 (two) times daily with a meal.    Dispense:  180 tablet    Refill:  3  . lisinopril (PRINIVIL,ZESTRIL) 5 MG tablet    Sig: Take 1 tablet (5 mg total) by mouth daily.    Dispense:  90 tablet    Refill:  3  . sitaGLIPtin (JANUVIA) 100 MG tablet    Sig: TAKE 1 TABLET (100 MG TOTAL) BY MOUTH DAILY.    Dispense:  90 tablet    Refill:  3  . glipiZIDE (GLIPIZIDE XL) 10 MG 24 hr tablet    Sig: TAKE 1 TABLET BY MOUTH DAILY WITH BREAKFAST.    Dispense:  90 tablet    Refill:  3  . fluticasone (FLONASE) 50 MCG/ACT nasal spray    Sig: PLACE 2 SPRAYS INTO BOTH NOSTRILS DAILY.    Dispense:  48 g    Refill:  3    USE THIS 90 DAY SUPPLY TO REPLACE RX SENT TODAY FOR 30 DAY SUPPLY.  . canagliflozin (INVOKANA) 300 MG TABS tablet    Sig: Take 1 tablet (300 mg total) by mouth daily before breakfast.    Dispense:  90 tablet    Refill:  3  . buPROPion (WELLBUTRIN SR) 100 MG 12 hr tablet    Sig: Take 1 tablet (100 mg total) by mouth daily.    Dispense:  90 tablet    Refill:  3  . azelastine (ASTELIN) 0.1 % nasal spray    Sig: PLACE 2 SPRAYS INTO BOTH NOSTRILS 2 TIMES DAILY AS DIRECTED    Dispense:  90 mL    Refill:  3  . albuterol (PROAIR HFA) 108 (90 Base) MCG/ACT inhaler    Sig: Inhale 2 puffs into the lungs every 6 (six) hours as needed.    Dispense:  18 g    Refill:  1  . acyclovir (ZOVIRAX) 400 MG tablet    Sig: TAKE 1  TABLET BY MOUTH 2 TIMES DAILY.    Dispense:  180 tablet    Refill:  3    Return in about 3 months (around 08/04/2016) for recheck.    Adara Kittle Elayne Guerin, M.D. [facility] [address] [place], [place]  [address] [phone] phone [phone] fax

## 2016-05-05 LAB — HEMOGLOBIN A1C
HEMOGLOBIN A1C: 8.4 % — AB (ref <5.7)
MEAN PLASMA GLUCOSE: 194 mg/dL

## 2016-05-05 LAB — URINE CULTURE

## 2016-05-06 MED FILL — rOPINIRole HCL 1 MG TABS: 1 | 90 days supply | Qty: 90 | Fill #0

## 2016-05-06 MED FILL — FLUTICASONE PROP 50 MCG SPR: 50 | 90 days supply | Qty: 48 | Fill #0

## 2016-05-06 MED FILL — AZELASTINE HCL 137 MCG SPRY: 0.1 | 90 days supply | Qty: 90 | Fill #0

## 2016-05-06 MED FILL — SERTRALINE HCL 100 MG TAB: 100 | 90 days supply | Qty: 135 | Fill #0

## 2016-05-06 MED FILL — metFORMIN HCL 1000 MG TABS: 1000 | 90 days supply | Qty: 180 | Fill #0

## 2016-05-06 MED FILL — MONTELUKAST SOD 10 MG TAB: 10 | 90 days supply | Qty: 90 | Fill #0

## 2016-05-06 MED FILL — VENTOLIN HFA 90 MCG INHALER: 108 (90 BAS | 25 days supply | Qty: 18 | Fill #0

## 2016-05-06 MED FILL — LISINOPRIL 5 MG TABLET: 5 | 90 days supply | Qty: 90 | Fill #0

## 2016-05-06 MED FILL — INVOKANA 300 MG TABLET: 300 | 90 days supply | Qty: 90 | Fill #0

## 2016-05-09 ENCOUNTER — Ambulatory Visit: Payer: Self-pay | Admitting: Pharmacist

## 2016-05-09 ENCOUNTER — Encounter: Payer: Self-pay | Admitting: Family Medicine

## 2016-05-09 MED FILL — LEVOCETIRIZINE 5 MG TABLET: 5 | 30 days supply | Qty: 30 | Fill #2

## 2016-05-15 MED FILL — ZOLPIDEM TARTRATE 5 MG TAB: 5 | 30 days supply | Qty: 30 | Fill #2

## 2016-05-22 IMAGING — MR MR LUMBAR SPINE W/O CM
5 series · 43 of 48 positions shown · non-contrast
Comparison: Lumbar spine radiographs 06/05/2013.

CLINICAL DATA: Left-sided low back pain. Occasional numbness in the
right lower extremity.

EXAM:
MRI LUMBAR SPINE WITHOUT CONTRAST
TECHNIQUE: Multiplanar, multisequence MR imaging of the lumbar spine was
performed. No intravenous contrast was administered.

[Series 3: tirm sag · sagittal · 4.0mm · 0.55mm/px · 6 of 13 slices shown]
[im 1/13]
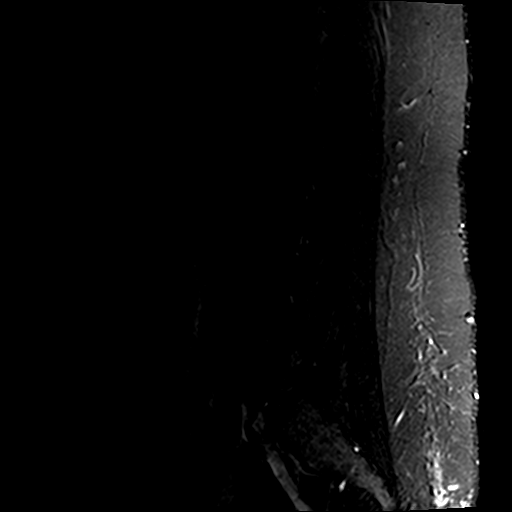
[im 3/13]
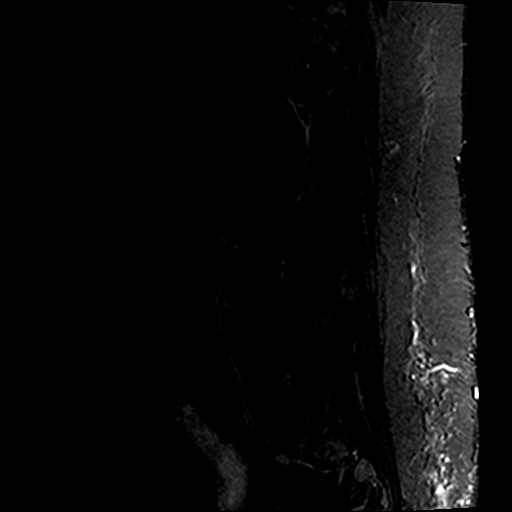
[im 5/13]
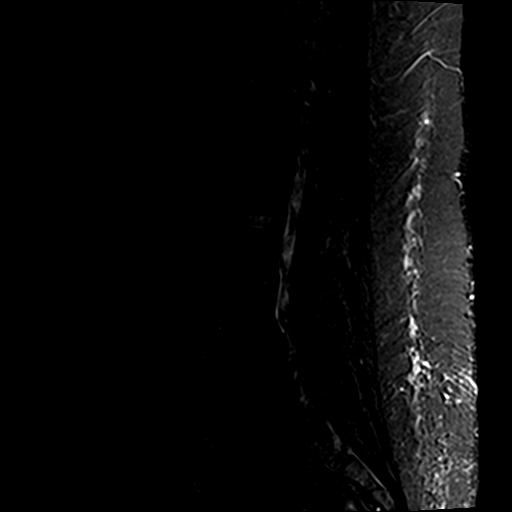
[im 8/13]
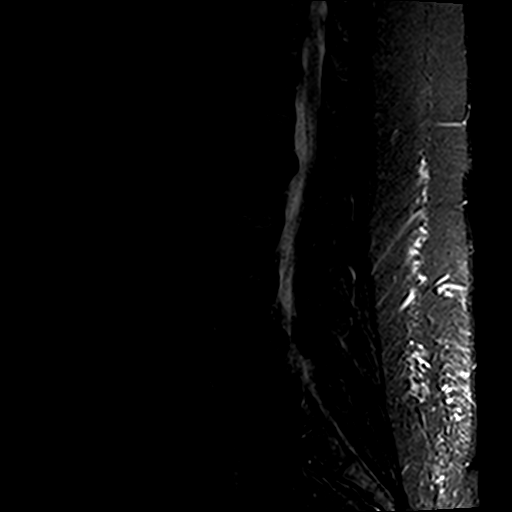
[im 10/13]
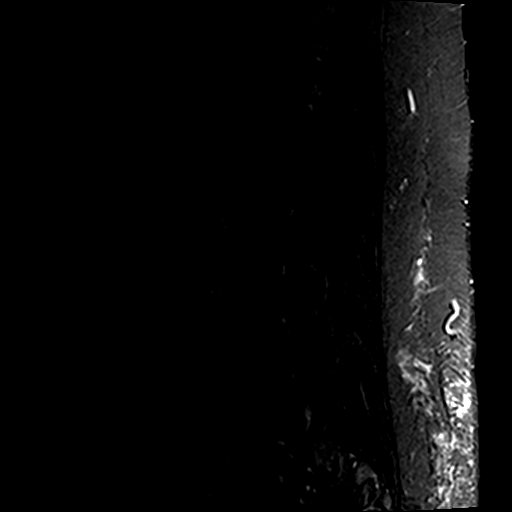
[im 13/13]
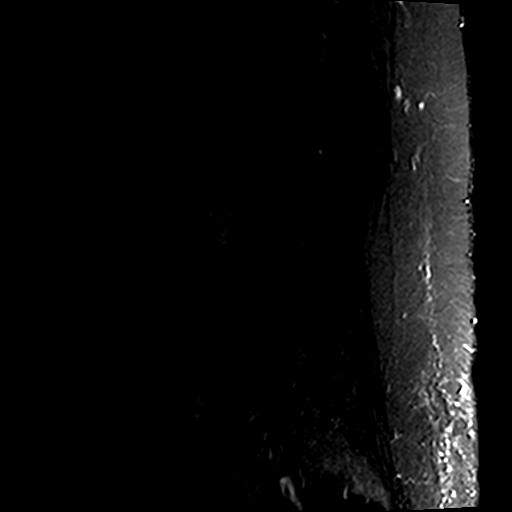

[Series 4: T2 · sagittal · 4.0mm · 0.88mm/px · 6 of 13 slices shown (1 of 2)]
[im 1/13]
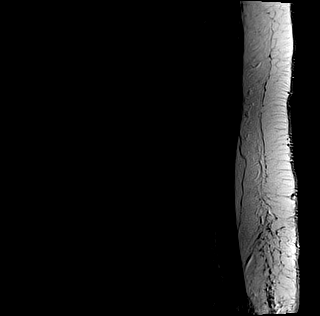
[im 3/13]
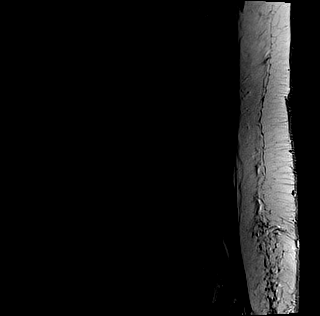
[im 5/13]
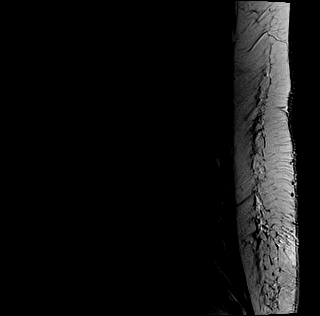
[im 8/13]
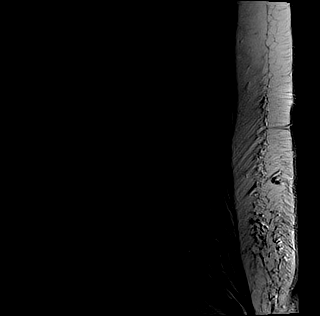
[im 10/13]
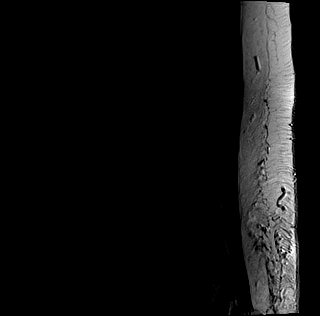
[im 13/13]
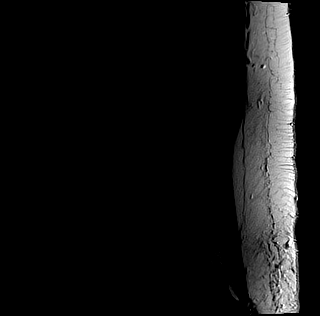

[Series 5: T1 · sagittal · 4.0mm · 0.88mm/px · 6 of 13 slices shown (1 of 2)]
[im 1/13]
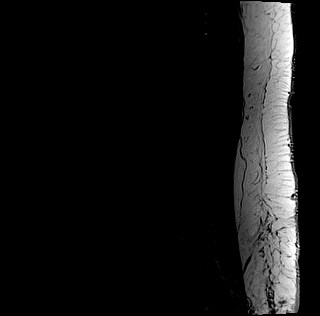
[im 3/13]
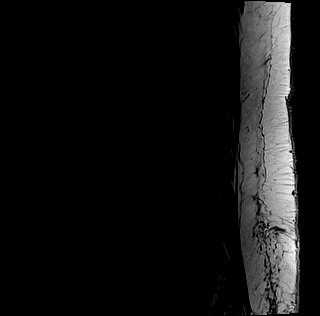
[im 5/13]
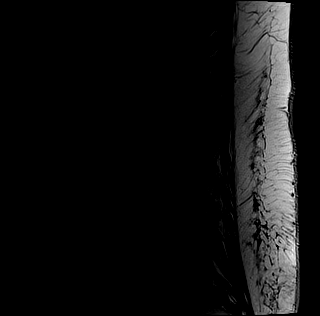
[im 8/13]
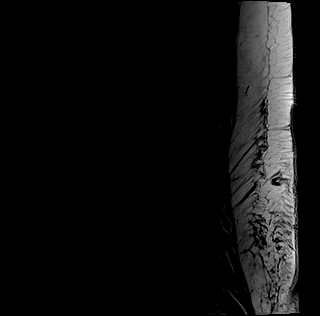
[im 10/13]
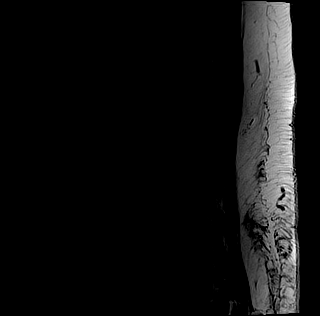
[im 13/13]
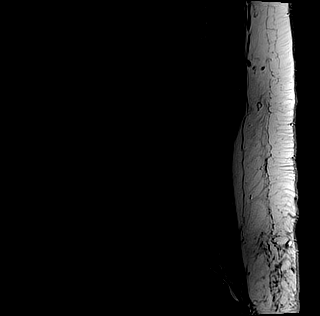

[Series 7: T2 · axial · 4.0mm · 0.70mm/px · z∈[+48,+216]mm · 15 of 31 slices shown (2 of 2)]
[im 1/31]
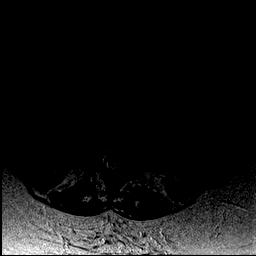
[im 3/31]
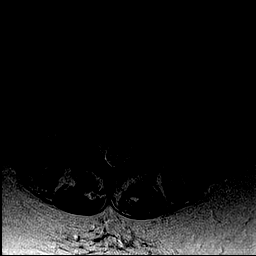
[im 5/31]
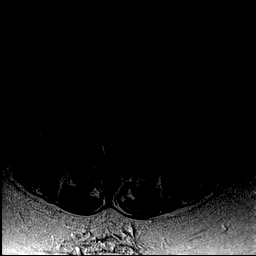
[im 7/31]
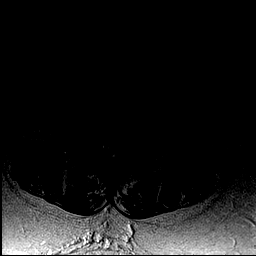
[im 9/31]
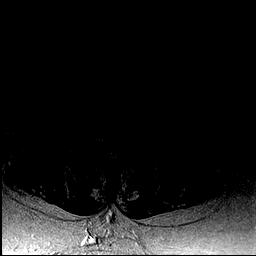
[im 11/31]
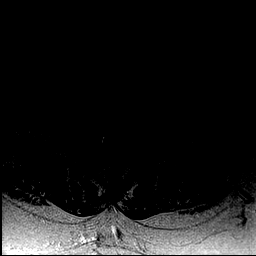
[im 13/31]
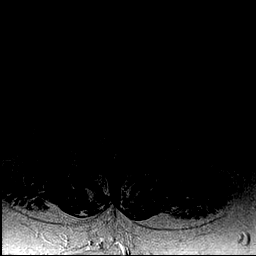
[im 16/31]
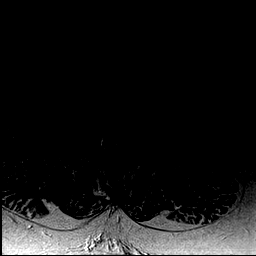
[im 18/31]
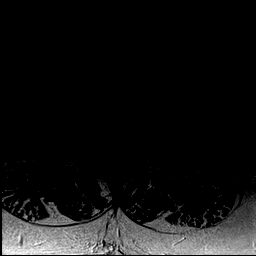
[im 20/31]
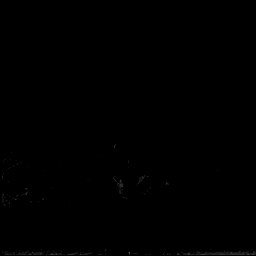
[im 22/31]
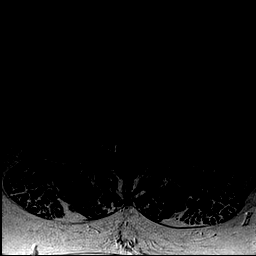
[im 24/31]
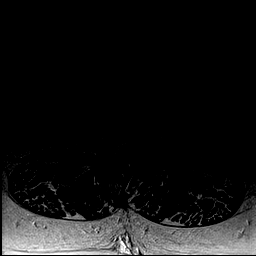
[im 26/31]
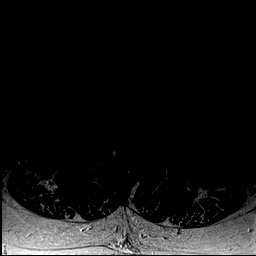
[im 28/31]
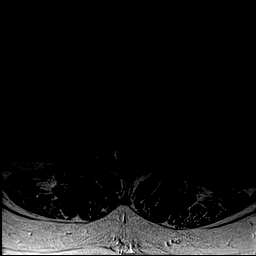
[im 31/31]
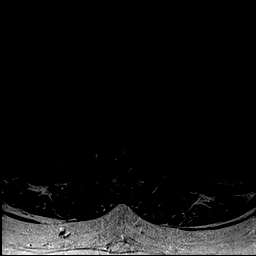

[Series 8: T1 · axial · 4.0mm · 0.70mm/px · z∈[+48,+216]mm · 10 of 31 slices shown (2 of 2)]
[im 1/31]
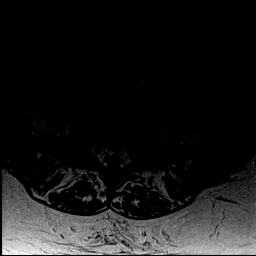
[im 3/31]
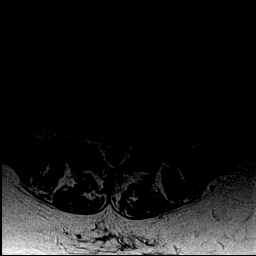
[im 5/31]
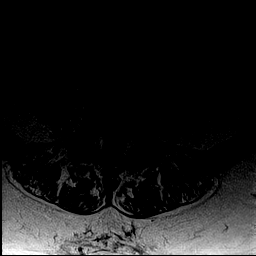
[im 9/31]
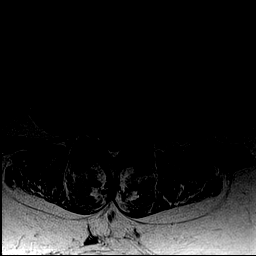
[im 13/31]
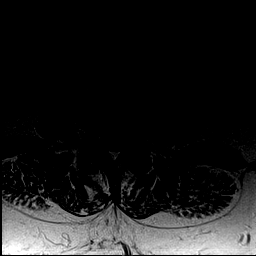
[im 16/31]
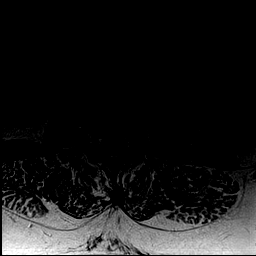
[im 18/31]
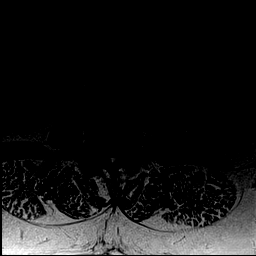
[im 22/31]
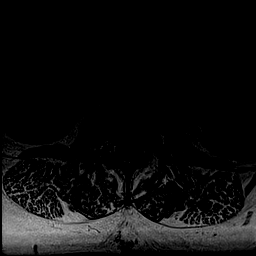
[im 26/31]
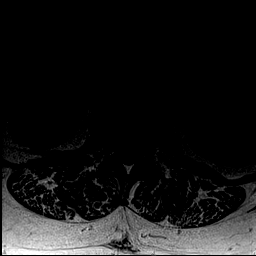
[im 31/31]
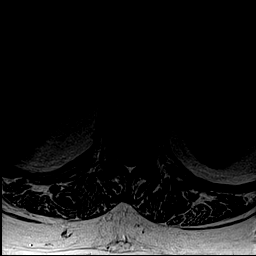

[43 of 48 positions shown; findings below may reference images not displayed]

FINDINGS: Normal signal is present in the conus medullaris which terminates at
T12-L1, within normal limits. Chronic fatty infiltration is present
at the anterior endplates of L1-2. Hemangiomas are present on the
right at L2 and L4. Grade 1 anterolisthesis at L4-5 measures 7 mm.

There straightening of the normal lumbar lordosis. Limited imaging
of the abdomen is unremarkable.

Foraminal narrowing bilaterally T10-11 and T11-12 is secondary to
facet disease.

T12-L1:  Negative.

L1-2: A mild broad-based disc protrusion partially effaces the
ventral CSF. There is no focal stenosis.

L2-3: No significant disc protrusion is present. Mild facet
hypertrophy is evident. There is no significant stenosis.

L3-4: No significant focal disc protrusion is present. Moderate
facet hypertrophy is evident bilaterally without significant focal
stenosis.

L4-5: Moderate facet hypertrophy is noted bilaterally. There is
uncovering of a broad-based disc protrusion. The disc protrusion is
asymmetric to the left. This results in moderate left subarticular
and foraminal narrowing. Mild right subarticular and foraminal
narrowing is present.

L5-S1: A shallow central disc protrusion is present. Mild facet
hypertrophy is noted bilaterally. There is no significant stenosis.
IMPRESSION: 1. Moderate left subarticular and foraminal stenosis at L4-5
secondary to uncovering of a leftward disc protrusion associated
with moderate bilateral facet hypertrophy and 7 mm grade 1
anterolisthesis.
2. Mild right subarticular and foraminal stenosis at L4-5.
3. Chronic endplate changes anteriorly at L1-2 with a mild
broad-based disc protrusion which partially effaces the ventral CSF
but does not create any significant focal stenosis.
4. Multilevel facet hypertrophy throughout the lumbar spine, most
prominent at L4-5.

## 2016-05-27 MED ORDER — LIRAGLUTIDE 18 MG/3ML ~~LOC~~ SOPN: 1.2000 mg | PEN_INJECTOR | Freq: Every day | SUBCUTANEOUS | Status: DC

## 2016-05-27 NOTE — Addendum Note (Signed)
Addended by: [name] on: 05/27/2016 05:50 PM   Modules accepted: Orders

## 2016-05-28 MED FILL — VICTOZA 2-PAK 18 MG/3 ML PE: 18 | 30 days supply | Qty: 6 | Fill #0

## 2016-05-29 ENCOUNTER — Telehealth: Payer: Self-pay

## 2016-05-29 DIAGNOSIS — M4806 Spinal stenosis, lumbar region: Secondary | ICD-10-CM | POA: Diagnosis not present

## 2016-05-29 DIAGNOSIS — M4316 Spondylolisthesis, lumbar region: Secondary | ICD-10-CM | POA: Diagnosis not present

## 2016-05-29 NOTE — Telephone Encounter (Signed)
Pens and needles for medication: Cristina [name]    Dr. Tamala Julian just put the patient on this medication   [facility] #  [phone]

## 2016-05-30 MED ORDER — PEN NEEDLES 31G X 5 MM MISC: 1.0000 | Freq: Every day | Status: DC

## 2016-05-30 MED FILL — UNIFINE PENTIPS 31GX3/16: 31G X 5 MM | 90 days supply | Qty: 100 | Fill #0

## 2016-05-30 NOTE — Telephone Encounter (Signed)
Rx for pen needles sent in for use with Victoza pens and pt aware.

## 2016-05-31 MED FILL — GABAPENTIN 100 MG CAPSULE: 100 | 30 days supply | Qty: 90 | Fill #0

## 2016-05-31 MED FILL — MELOXICAM 15 MG TABLET: 15 | 30 days supply | Qty: 30 | Fill #0

## 2016-06-03 DIAGNOSIS — G4733 Obstructive sleep apnea (adult) (pediatric): Secondary | ICD-10-CM | POA: Diagnosis not present

## 2016-06-04 MED FILL — PHENTERMINE 37.5 MG TABLET: 37.5 | 30 days supply | Qty: 30 | Fill #2

## 2016-06-11 ENCOUNTER — Ambulatory Visit: Payer: Self-pay | Admitting: Pharmacist

## 2016-06-18 ENCOUNTER — Encounter: Payer: 59 | Attending: Family Medicine | Admitting: Skilled Nursing Facility1

## 2016-06-18 ENCOUNTER — Encounter: Payer: Self-pay | Admitting: Skilled Nursing Facility1

## 2016-06-18 ENCOUNTER — Ambulatory Visit: Payer: Self-pay | Admitting: Pharmacist

## 2016-06-18 DIAGNOSIS — Z713 Dietary counseling and surveillance: Secondary | ICD-10-CM | POA: Diagnosis not present

## 2016-06-18 DIAGNOSIS — E1142 Type 2 diabetes mellitus with diabetic polyneuropathy: Secondary | ICD-10-CM

## 2016-06-18 NOTE — Patient Instructions (Signed)
-  Look into adding more physical activity into your week; try your stationary bike -Log your numbers and look for patterns once a month -Try to check your blood sugar at different times throughout the week sometimes before you eat and sometimes 2 hours after you eat -Check a different finger every time you check  -Do not use your needles more than once -For breakfast: cold cereal with added nuts OR protein shake with one piece of fruit and a handful of granola -For snack: fruit and cheese or gram crackers and peanut butter -For Lunch: burger patty, fruit, rice, peas, green beans, and carrots -For dinner: half the plate with spagetti and meat sauce the other half a salad with fruit

## 2016-06-18 NOTE — Progress Notes (Signed)
Diabetes Self-Management Education  Visit Type: First/Initial  Appt. Start Time:  Appt. End Time:   06/18/2016  [name], identified by name and date of birth, is a 55 y.o. female with a diagnosis of Diabetes: Type 2.   ASSESSMENT  Height 5' (1.524 m), weight 221 lb (100.2 kg). Body mass index is 43.16 kg/m.      Diabetes Self-Management Education - 06/18/16 1608      Visit Information   Visit Type First/Initial     Initial Visit   Diabetes Type Type 2   Are you currently following a meal plan? No   Are you taking your medications as prescribed? Yes   Date Diagnosed 20 years ago     Health Coping   How would you rate your overall health? Good     Psychosocial Assessment   Patient Belief/Attitude about Diabetes Defeat/Burnout   Self-care barriers None   Self-management support Family     Pre-Education Assessment   Patient understands the diabetes disease and treatment process. Needs Instruction   Patient understands incorporating nutritional management into lifestyle. Needs Instruction   Patient undertands incorporating physical activity into lifestyle. Needs Instruction   Patient understands using medications safely. Needs Instruction   Patient understands monitoring blood glucose, interpreting and using results Needs Instruction   Patient understands prevention, detection, and treatment of acute complications. Needs Instruction   Patient understands prevention, detection, and treatment of chronic complications. Needs Instruction   Patient understands how to develop strategies to address psychosocial issues. Needs Instruction   Patient understands how to develop strategies to promote health/change behavior. Needs Instruction     Complications   Last HgB A1C per patient/outside source 8.4 %   How often do you check your blood sugar? 1-2 times/day   Fasting Blood glucose range (mg/dL) 70-129   Number of hypoglycemic episodes per month 1   Can you tell when your  blood sugar is low? Yes   What do you do if your blood sugar is low? eat something   Number of hyperglycemic episodes per week 0   Have you had a dilated eye exam in the past 12 months? Yes   Have you had a dental exam in the past 12 months? Yes   Are you checking your feet? Yes   How many days per week are you checking your feet? 2     Dietary Intake   Breakfast protein shake and fruit   Snack (morning) fruit   Lunch 3 inch subway sub and chips   Dinner spagetti   Beverage(s) water, unsweet cool aid, soda, 1 % milk     Exercise   Exercise Type ADL's     Patient Education   Previous Diabetes Education --  years ago with [facility]   Disease state  Definition of diabetes, type 1 and 2, and the diagnosis of diabetes   Nutrition management  Role of diet in the treatment of diabetes and the relationship between the three main macronutrients and blood glucose level;Food label reading, portion sizes and measuring food.;Carbohydrate counting;Reviewed blood glucose goals for pre and post meals and how to evaluate the patients' food intake on their blood glucose level.;Meal timing in regards to the patients' current diabetes medication.;Information on hints to eating out and maintain blood glucose control.   Physical activity and exercise  Role of exercise on diabetes management, blood pressure control and cardiac health.;Identified with patient nutritional and/or medication changes necessary with exercise.   Monitoring Taught/evaluated SMBG meter.;Purpose  and frequency of SMBG.;Yearly dilated eye exam;Daily foot exams;Identified appropriate SMBG and/or A1C goals.   Acute complications Taught treatment of hypoglycemia - the 15 rule.;Discussed and identified patients' treatment of hyperglycemia.   Chronic complications Dental care;Retinopathy and reason for yearly dilated eye exams;Assessed and discussed foot care and prevention of foot problems     Individualized Goals (developed by patient)    Nutrition Follow meal plan discussed;Adjust meds/carbs with exercise as discussed   Physical Activity Exercise 1-2 times per week;15 minutes per day   Monitoring  test my blood glucose as discussed;test blood glucose pre and post meals as discussed   Reducing Risk do foot checks daily;treat hypoglycemia with 15 grams of carbs if blood glucose less than 70mg /dL;increase portions of nuts and seeds     Post-Education Assessment   Patient understands the diabetes disease and treatment process. Demonstrates understanding / competency   Patient understands incorporating nutritional management into lifestyle. Demonstrates understanding / competency   Patient undertands incorporating physical activity into lifestyle. Demonstrates understanding / competency   Patient understands using medications safely. Demonstrates understanding / competency   Patient understands monitoring blood glucose, interpreting and using results Demonstrates understanding / competency   Patient understands prevention, detection, and treatment of acute complications. Demonstrates understanding / competency   Patient understands prevention, detection, and treatment of chronic complications. Demonstrates understanding / competency   Patient understands how to develop strategies to address psychosocial issues. Demonstrates understanding / competency   Patient understands how to develop strategies to promote health/change behavior. Demonstrates understanding / competency     Outcomes   Expected Outcomes Demonstrated interest in learning. Expect positive outcomes   Future DMSE 4-6 wks   Program Status Completed      Individualized Plan for Diabetes Self-Management Training:   Learning Objective:  Patient will have a greater understanding of diabetes self-management. Patient education plan is to attend individual and/or group sessions per assessed needs and concerns.   Plan:   Patient Instructions  -Look into adding more  physical activity into your week; try your stationary bike -Log your numbers and look for patterns once a month -Try to check your blood sugar at different times throughout the week sometimes before you eat and sometimes 2 hours after you eat -Check a different finger every time you check  -Do not use your needles more than once -For breakfast: cold cereal with added nuts OR protein shake with one piece of fruit and a handful of granola -For snack: fruit and cheese or gram crackers and peanut butter -For Lunch: burger patty, fruit, rice, peas, green beans, and carrots -For dinner: half the plate with spagetti and meat sauce the other half a salad with fruit   Expected Outcomes:  Demonstrated interest in learning. Expect positive outcomes  Education material provided: Living Well with Diabetes, Meal plan card, My Plate and Snack sheet  If problems or questions, patient to contact team via:  Phone  Future DSME appointment: 4-6 wks

## 2016-06-20 ENCOUNTER — Encounter: Payer: Self-pay | Admitting: Pharmacist

## 2016-06-20 ENCOUNTER — Other Ambulatory Visit: Payer: Self-pay | Admitting: Pharmacist

## 2016-06-20 VITALS — BP 118/78 | Wt 219.0 lb

## 2016-06-20 DIAGNOSIS — E118 Type 2 diabetes mellitus with unspecified complications: Secondary | ICD-10-CM

## 2016-06-20 MED FILL — BUPROPION HCL SR 100 MG TAB: 100 | 90 days supply | Qty: 90 | Fill #0

## 2016-06-20 MED FILL — SIMVASTATIN 40 MG TABLET: 40 | 90 days supply | Qty: 90 | Fill #1

## 2016-06-20 MED FILL — ANUCORT-HC 25 MG SUPP: 25 | 12 days supply | Qty: 25 | Fill #1

## 2016-06-20 MED FILL — GABAPENTIN 100 MG CAPSULE: 100 | 30 days supply | Qty: 90 | Fill #1

## 2016-06-20 MED FILL — LEVOCETIRIZINE 5 MG TABLET: 5 | 30 days supply | Qty: 30 | Fill #3

## 2016-06-20 MED FILL — ACYCLOVIR 400 MG TABLET: 400 | 90 days supply | Qty: 180 | Fill #1

## 2016-06-20 MED FILL — ZOLPIDEM TARTRATE 5 MG TAB: 5 | 30 days supply | Qty: 30 | Fill #3

## 2016-06-20 MED FILL — glipiZIDE XL 10 MG TB24: 10 | 90 days supply | Qty: 90 | Fill #0

## 2016-06-20 MED FILL — VICTOZA 2-PAK 18 MG/3 ML PE: 18 | 30 days supply | Qty: 6 | Fill #1

## 2016-06-20 MED FILL — TRUE METRIX GLUCOSE TEST ST: 33 days supply | Qty: 100 | Fill #0

## 2016-06-20 MED FILL — PANTOPRAZOLE SOD DR 40 MG T: 40 | 90 days supply | Qty: 180 | Fill #1

## 2016-06-20 NOTE — Patient Outreach (Signed)
[employer] [facility]) Care Management  06/20/2016  Cristina [name] 04-29-1961 [id number]    Subjective:  Patient is a 55 yo female with type 2 diabetes who presents today for follow-up as part of the employer-sponsored Link to Wellness program. Current diabetes regimen includes Invokana, glipizide ER, metformin, and Victoza . Patient also continues on daily ASA, ACEi, and statin. Most recent MD follow-up was June 2017. Patient has a pending appt for Oct 2017. At recent appt, Januvia was discontinuied and Victoza added in an effort to enhance weight loss.    Diabetes Assessment:  Changes to diabetes regimen include discontinuing Januvia and adding Victoza 1.2 mg daily.  Patient is tolerating well.  She also continues doing well on Invokana 300 mg dose.  Patient reports good medication compliance, but she does forget a dose occasionally.  Most recent A1c was elevated at 8.4% (prev 7.5%), which is increased and exceeding goal of less than 7%.  Weight has decreased by 9 lb since since last visit due to dietary improvement.  She continues taking phentermine.  She was interested in gastric sleeve but out of pocket costs will be too much for this patient to afford and she is unable to have the procedure at this time.  Patient did bring meter and has increased testing to 1-2 times daily per MD and nutritionist recommendation.  Last reading was 111, fasting this AM.  Fastings remain elevated with most readings > 130.  She uses TrueResult and keeps one meter at work and one at home.  I have given her two new TrueMetrix meters to replace TrueResult meters.  Hypoglycemia is rare.  Patient has not had recent hypoglycemic episodes.  Patient reports continued mild signs and symptoms of neuropathy including tingling and strange, sometimes painful sensation on tops of toes, both feet, specifically if she rubs them on something (ex: carpet) or hits her foot against something.  Patient reports MD is aware and  she has described this to her.  No signs of infection.  Patient is up to date on eye and dental exam.     7d - 111  14d - 111 30d - 132   Lifestyle Assessment:  Exercising - None at this time due to lack of motivation.  She still owns a stationary bike and has set a goal to resume exercise on this daily for 15 minutes starting Monday, 8/14.      Diet -  Patient reports overall improvement in diet and is now seeing a nutritionist whom she will follow-up with in 6 weeks.  She is making better choices overall, example chicken vs beef.  Portion sizes have improved and she is now counting carbs and limiting to 45 per meal.  Also limiting sugary drinks and koolaid, drinking fewer sodas, especially regular sodas. Additional nutritional materials were provided today.  15g snack options magnet and Life in the [employer] nutrition booklet.    Food recall: Breakfast - protein shake and fruit (orange or apple or grape) Snack - graham crackers and PB (3 squares) Lunch - Salad with chicken with avacodo dressing, 1/2 yoplait yogurt, fruit Snack - fruit Supper - cooking at home more, eating out only on occasion now, Hamburger (top bun only), 1/2 baked potato, side salad Snack - none Beverages - water, soda is limited, rare, orange juice on occasion, sugar-free koolaid (slenda), milk over cereal   Plan and Goals: 1)  Continue testing regularly, good job with improvement in this area 2)  Continue to make  healthy dietary choices, monitoring portion control and counting carbs 3)  Resume riding stationary bike daily for 15 minutes starting Monday 06/24/16 4)  Goal of improved A1c by next appt with Dr. Tamala Julian 5)  Follow-up on Thursday November 9th @ 9:30 am  Great to see you today!

## 2016-07-01 DIAGNOSIS — M4806 Spinal stenosis, lumbar region: Secondary | ICD-10-CM | POA: Diagnosis not present

## 2016-07-01 DIAGNOSIS — M4316 Spondylolisthesis, lumbar region: Secondary | ICD-10-CM | POA: Diagnosis not present

## 2016-07-01 MED FILL — OXAPROZIN 600 MG TABLET: 600 | 30 days supply | Qty: 60 | Fill #0

## 2016-07-02 MED FILL — ACETAMINOPHEN/COD #4 TABLET: 300-60 | 11 days supply | Qty: 90 | Fill #0

## 2016-07-22 MED FILL — VICTOZA 2-PAK 18 MG/3 ML PE: 18 | 30 days supply | Qty: 6 | Fill #2

## 2016-07-31 ENCOUNTER — Ambulatory Visit: Payer: 59 | Admitting: Skilled Nursing Facility1

## 2016-08-01 DIAGNOSIS — M4316 Spondylolisthesis, lumbar region: Secondary | ICD-10-CM | POA: Diagnosis not present

## 2016-08-01 DIAGNOSIS — M4806 Spinal stenosis, lumbar region: Secondary | ICD-10-CM | POA: Diagnosis not present

## 2016-08-01 MED FILL — GABAPENTIN 100 MG CAPSULE: 100 | 30 days supply | Qty: 150 | Fill #0

## 2016-08-01 MED FILL — MONTELUKAST SOD 10 MG TAB: 10 | 90 days supply | Qty: 90 | Fill #1

## 2016-08-01 MED FILL — SERTRALINE HCL 100 MG TAB: 100 | 90 days supply | Qty: 135 | Fill #1

## 2016-08-01 MED FILL — rOPINIRole HCL 1 MG TABS: 1 | 90 days supply | Qty: 90 | Fill #1

## 2016-08-01 MED FILL — INVOKANA 300 MG TABLET: 300 | 90 days supply | Qty: 90 | Fill #1

## 2016-08-01 MED FILL — metFORMIN HCL 1000 MG TABS: 1000 | 90 days supply | Qty: 180 | Fill #1

## 2016-08-01 MED FILL — LISINOPRIL 5 MG TABLET: 5 | 90 days supply | Qty: 90 | Fill #1

## 2016-08-02 MED FILL — LEVOCETIRIZINE 5 MG TABLET: 5 | 30 days supply | Qty: 30 | Fill #0

## 2016-08-08 DIAGNOSIS — M4806 Spinal stenosis, lumbar region: Secondary | ICD-10-CM | POA: Diagnosis not present

## 2016-08-08 DIAGNOSIS — M4316 Spondylolisthesis, lumbar region: Secondary | ICD-10-CM | POA: Diagnosis not present

## 2016-08-08 DIAGNOSIS — M5416 Radiculopathy, lumbar region: Secondary | ICD-10-CM | POA: Diagnosis not present

## 2016-08-15 ENCOUNTER — Ambulatory Visit (INDEPENDENT_AMBULATORY_CARE_PROVIDER_SITE_OTHER): Payer: 59 | Admitting: Family Medicine

## 2016-08-15 VITALS — BP 116/60 | HR 108 | Temp 98.5°F | Resp 18 | Wt 219.6 lb

## 2016-08-15 DIAGNOSIS — E1142 Type 2 diabetes mellitus with diabetic polyneuropathy: Secondary | ICD-10-CM

## 2016-08-15 DIAGNOSIS — J029 Acute pharyngitis, unspecified: Secondary | ICD-10-CM | POA: Diagnosis not present

## 2016-08-15 LAB — POCT RAPID STREP A (OFFICE): Rapid Strep A Screen: NEGATIVE

## 2016-08-15 MED ORDER — PSEUDOEPHEDRINE-GUAIFENESIN ER 60-600 MG PO TB12: 1.0000 | ORAL_TABLET | Freq: Two times a day (BID) | ORAL | 0 refills | Status: DC

## 2016-08-15 MED FILL — GUAIFENESIN-PSE ER 600-60 M: 60-600 | 10 days supply | Qty: 20 | Fill #0

## 2016-08-15 NOTE — Progress Notes (Signed)
Subjective:    Patient ID: Cristina [name], female    DOB: 1961/09/15, 54 y.o.   MRN: [id number]  08/15/2016  URI (cough,mucous x sorethroat "burning" x 4 days)   HPI This 55 y.o. female presents for evaluation of sinus congestion.  Onset four days ago.  +fever low grade; Tmax 100.0  +sweats constantly.  Mild chills.  +HA.  No ear pain.  +ST; burning; pain with swallowing mild.  Nighttime worsens.  No rhinorrhea; no nasal congestion. +coughing; yellow mucous.  +PND. No sputum production.  No cough.  +SOB; mild chest pain.  No wheezing.  No n/v/d.  No medication for symptoms.  Took Tylenol two days ago.  No Albuterol.Using Astelin this week; using Flonase this week.  Taking Xyzal.  Has appointment with allergy coming up.    Sugars running much better.  Tolerating Victoza; has lost a couple of pounds.  Sugars ranging 66-130.  Wt Readings from Last 3 Encounters:  08/15/16 219 lb 9.6 oz (99.6 kg)  06/20/16 219 lb (99.3 kg)  06/18/16 221 lb (100.2 kg)    Review of Systems  Constitutional: Positive for chills and diaphoresis. Negative for fatigue and fever.  HENT: Positive for congestion, postnasal drip, sore throat and voice change. Negative for ear pain, rhinorrhea, sinus pressure, sneezing and trouble swallowing.   Eyes: Negative for visual disturbance.  Respiratory: Positive for shortness of breath. Negative for cough and wheezing.   Cardiovascular: Negative for chest pain, palpitations and leg swelling.  Gastrointestinal: Negative for abdominal pain, constipation, diarrhea, nausea and vomiting.  Endocrine: Negative for cold intolerance, heat intolerance, polydipsia, polyphagia and polyuria.  Neurological: Positive for headaches. Negative for dizziness, tremors, seizures, syncope, facial asymmetry, speech difficulty, weakness, light-headedness and numbness.    Past Medical History:  Diagnosis Date  . Allergic rhinitis, cause unspecified   . Arthritis   . Constipation   . CTS (carpal  tunnel syndrome)   . Cystocele   . Depression   . Diabetes mellitus   . Dysfunction of eustachian tube   . Fatty liver   . Fissure, anal   . Genital herpes   . GERD (gastroesophageal reflux disease)   . Hemorrhoid   . Hx of migraine headaches   . Hyperlipidemia   . Hypertension   . Insomnia   . Iron deficiency anemia, unspecified   . Obesity   . OSA on CPAP   . Rectocele    Past Surgical History:  Procedure Laterality Date  . ABDOMINAL HYSTERECTOMY  11/11/2010   [place] Plan.  Ovaries intact.  Uterine fibroids with DUB.  [name] CARPAL TUNNEL RELEASE  1989   Left  . CHOLECYSTECTOMY    . CHOLECYSTECTOMY    . COLONOSCOPY  06/29/2012   normal.  Repeat in 5 years.  [name] [place]  . ESOPHAGOGASTRODUODENOSCOPY  12/13/2011   dysphagia.  Henrene Pastor.  Normal.  . Sleep study  09/11/2012   severe OSA.  CPAP titration at 12 cm water pressure.  . TUBAL LIGATION  1988   Allergies  Allergen Reactions  . Fish Oil Rash    Social History   Social History  . Marital status: Single    Spouse name: n/a  . Number of children: 3  . Years of education: college   Occupational History  . [employer]   Social History Main Topics  . Smoking status: Former Research scientist (life sciences)  . Smokeless tobacco: Never Used  Comment: smoked occasionally longest 6 mos.  . Alcohol use No  . Drug use: No  . Sexual activity: No   Other Topics Concern  . Not on file   Social History Narrative      Marital status:  Divorced in 2000 after six years of marriage; not dating in 2017.      Children:  3 daughters (38, 89, 58); 6 grandchildren.      Lives: alone.  2 dogs; grandson stays some.      Employment:  Working at [employer]. 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      Tobacco: none      Alcohol: socially; special occasions.  Rare.      Drugs: none      Exercise:  Sporadic; stationary bike.      Sexual activity:  Not sexually active since 2011.        Guns:  No guns in the home.       Smoke detectors in use,       Seatbelt:  Sometimes uses seat belts. 75% of time.  No texting while driving.     Family History  Problem Relation Age of Onset  . Hypertension Mother 71  . Osteoarthritis Mother   . Diabetes Mother   . Colon cancer Mother 37  . Cancer Mother 75    colon cancer  . Colon cancer Paternal Grandmother   . Cancer Paternal Grandmother   . Stroke Maternal Grandmother   . Colon polyps Brother     x 2 brother  . Diabetes Daughter   . Hypertension Daughter   . Sleep apnea Daughter   . Mental illness Daughter     depression  . Migraines Daughter   . Colon polyps Brother   . Cirrhosis Brother     alcoholism  . Alcohol abuse Brother   . Migraines Daughter   . Mental illness Daughter     anxiety attacks  . Protein S deficiency Daughter   . Sleep apnea Brother   . Hyperlipidemia Brother   . Colon cancer Paternal Aunt        Objective:    BP 116/60 (BP Location: Right Arm, Cuff Size: Large)   Pulse (!) 108   Temp 98.5 F (36.9 C) (Oral)   Resp 18   Wt 219 lb 9.6 oz (99.6 kg)   SpO2 97%   BMI 42.89 kg/m  Physical Exam  Constitutional: She is oriented to person, place, and time. She appears well-developed and well-nourished. No distress.  HENT:  Head: Normocephalic and atraumatic.  Right Ear: Tympanic membrane, external ear and ear canal normal.  Left Ear: Tympanic membrane, external ear and ear canal normal.  Nose: Nose normal.  Mouth/Throat: Uvula is midline and mucous membranes are normal. Posterior oropharyngeal erythema present. No oropharyngeal exudate, posterior oropharyngeal edema or tonsillar abscesses.  Eyes: Conjunctivae and EOM are normal. Pupils are equal, round, and reactive to light.  Neck: Normal range of motion. Neck supple. Carotid bruit is not present. No thyromegaly present.  Cardiovascular: Normal rate, regular rhythm, normal heart sounds and intact distal pulses.  Exam reveals no gallop and  no friction rub.   No murmur heard. Pulmonary/Chest: Effort normal and breath sounds normal. She has no wheezes. She has no rales.  Lymphadenopathy:    She has cervical adenopathy.  Neurological: She is alert and oriented to person, place, and time. No cranial nerve deficit.  Skin: Skin is warm and dry. No rash noted. She is not  diaphoretic. No erythema. No pallor.  Psychiatric: She has a normal mood and affect. Her behavior is normal.   Results for orders placed or performed in visit on 08/15/16  POCT rapid strep A  Result Value Ref Range   Rapid Strep A Screen Negative Negative       Assessment & Plan:   1. Sore throat   2. Type 2 diabetes mellitus with diabetic polyneuropathy, without long-term current use of insulin ([place])    -New; consistent with viral etiology. -send throat culture. -rx for Mucinex D bid. -continue Xyzal, Astelin, and Flonase. -call in 5 days if no improvement; will treat for sinusitis. -sugars improved with Victoza; continue medication.  Orders Placed This Encounter  Procedures  . Culture, Group A Strep    Order Specific Question:   Source    Answer:   throat  . POCT rapid strep A   Meds ordered this encounter  Medications  . gabapentin (NEURONTIN) 100 MG capsule    Sig: Take 100 mg by mouth 3 (three) times daily. 200mg  morning 100mg  lunch 200mg  bedtime  . pseudoephedrine-guaifenesin (MUCINEX D) 60-600 MG 12 hr tablet    Sig: Take 1 tablet by mouth every 12 (twelve) hours.    Dispense:  20 tablet    Refill:  0    No Follow-up on file.   Ezeriah Luty Elayne Guerin, M.D. [facility] [address] [place], [place]  [address] [phone] phone [phone] fax

## 2016-08-15 NOTE — Patient Instructions (Signed)
IF you received an x-ray today, you will receive an invoice from [facility]. Please contact [facility] at [phone] with questions or concerns regarding your invoice.   IF you received labwork today, you will receive an invoice from [facility]. Please contact [facility] at [phone] with questions or concerns regarding your invoice.   Our billing staff will not be able to assist you with questions regarding bills from these companies.  You will be contacted with the lab results as soon as they are available. The fastest way to get your results is to activate your My Chart account. Instructions are located on the last page of this paperwork. If you have not heard from us regarding the results in 2 weeks, please contact this office.   Upper Respiratory Infection, Adult Most upper respiratory infections (URIs) are a viral infection of the air passages leading to the lungs. A URI affects the nose, throat, and upper air passages. The most common type of URI is nasopharyngitis and is typically referred to as "the common cold." URIs run their course and usually go away on their own. Most of the time, a URI does not require medical attention, but sometimes a bacterial infection in the upper airways can follow a viral infection. This is called a secondary infection. Sinus and middle ear infections are common types of secondary upper respiratory infections. Bacterial pneumonia can also complicate a URI. A URI can worsen asthma and chronic obstructive pulmonary disease (COPD). Sometimes, these complications can require emergency medical care and may be life threatening.  CAUSES Almost all URIs are caused by viruses. A virus is a type of germ and can spread from one person to another.  RISKS FACTORS You may be at risk for a URI if:   You smoke.   You have chronic heart or lung disease.  You have a weakened defense (immune) system.   You are very young  or very old.   You have nasal allergies or asthma.  You work in crowded or poorly ventilated areas.  You work in health care facilities or schools. SIGNS AND SYMPTOMS  Symptoms typically develop 2-3 days after you come in contact with a cold virus. Most viral URIs last 7-10 days. However, viral URIs from the influenza virus (flu virus) can last 14-18 days and are typically more severe. Symptoms may include:   Runny or stuffy (congested) nose.   Sneezing.   Cough.   Sore throat.   Headache.   Fatigue.   Fever.   Loss of appetite.   Pain in your forehead, behind your eyes, and over your cheekbones (sinus pain).  Muscle aches.  DIAGNOSIS  Your health care provider may diagnose a URI by:  Physical exam.  Tests to check that your symptoms are not due to another condition such as:  Strep throat.  Sinusitis.  Pneumonia.  Asthma. TREATMENT  A URI goes away on its own with time. It cannot be cured with medicines, but medicines may be prescribed or recommended to relieve symptoms. Medicines may help:  Reduce your fever.  Reduce your cough.  Relieve nasal congestion. HOME CARE INSTRUCTIONS   Take medicines only as directed by your health care provider.   Gargle warm saltwater or take cough drops to comfort your throat as directed by your health care provider.  Use a warm mist humidifier or inhale steam from a shower to increase air moisture. This may make it easier to breathe.  Drink enough fluid to keep your   urine clear or pale yellow.   Eat soups and other clear broths and maintain good nutrition.   Rest as needed.   Return to work when your temperature has returned to normal or as your health care provider advises. You may need to stay home longer to avoid infecting others. You can also use a face mask and careful hand washing to prevent spread of the virus.  Increase the usage of your inhaler if you have asthma.   Do not use any tobacco  products, including cigarettes, chewing tobacco, or electronic cigarettes. If you need help quitting, ask your health care provider. PREVENTION  The best way to protect yourself from getting a cold is to practice good hygiene.   Avoid oral or hand contact with people with cold symptoms.   Wash your hands often if contact occurs.  There is no clear evidence that vitamin C, vitamin E, echinacea, or exercise reduces the chance of developing a cold. However, it is always recommended to get plenty of rest, exercise, and practice good nutrition.  SEEK MEDICAL CARE IF:   You are getting worse rather than better.   Your symptoms are not controlled by medicine.   You have chills.  You have worsening shortness of breath.  You have brown or red mucus.  You have yellow or brown nasal discharge.  You have pain in your face, especially when you bend forward.  You have a fever.  You have swollen neck glands.  You have pain while swallowing.  You have white areas in the back of your throat. SEEK IMMEDIATE MEDICAL CARE IF:   You have severe or persistent:  Headache.  Ear pain.  Sinus pain.  Chest pain.  You have chronic lung disease and any of the following:  Wheezing.  Prolonged cough.  Coughing up blood.  A change in your usual mucus.  You have a stiff neck.  You have changes in your:  Vision.  Hearing.  Thinking.  Mood. MAKE SURE YOU:   Understand these instructions.  Will watch your condition.  Will get help right away if you are not doing well or get worse.   This information is not intended to replace advice given to you by your health care provider. Make sure you discuss any questions you have with your health care provider.   Document Released: 04/23/2001 Document Revised: 03/14/2015 Document Reviewed: 02/02/2014 Elsevier Interactive Patient Education 2016 Elsevier Inc.  

## 2016-08-17 LAB — CULTURE, GROUP A STREP: Organism ID, Bacteria: NORMAL

## 2016-08-19 ENCOUNTER — Encounter: Payer: Self-pay | Admitting: *Deleted

## 2016-08-20 ENCOUNTER — Encounter: Payer: Self-pay | Admitting: Family Medicine

## 2016-08-20 ENCOUNTER — Telehealth: Payer: Self-pay

## 2016-08-20 MED FILL — VICTOZA 2-PAK 18 MG/3 ML PE: 18 | 30 days supply | Qty: 6 | Fill #3

## 2016-08-20 MED FILL — UNIFINE PENTIPS 31GX3/16: 31G X 5 MM | 90 days supply | Qty: 100 | Fill #1

## 2016-08-20 MED FILL — UNIFINE PENTIPS 31GX3/16": 31G X 5 MM | 90 days supply | Qty: 100 | Fill #1

## 2016-08-20 NOTE — Telephone Encounter (Signed)
Pt states that Dr. Tamala Julian told her that if she was not better that to call her and she would call in an antibodic, and the patient would like this called in  Best number [phone]

## 2016-08-24 MED ORDER — AMOXICILLIN-POT CLAVULANATE 875-125 MG PO TABS: 1.0000 | ORAL_TABLET | Freq: Two times a day (BID) | ORAL | 0 refills | Status: DC

## 2016-08-27 ENCOUNTER — Ambulatory Visit (INDEPENDENT_AMBULATORY_CARE_PROVIDER_SITE_OTHER): Payer: 59 | Admitting: Family Medicine

## 2016-08-27 ENCOUNTER — Ambulatory Visit (INDEPENDENT_AMBULATORY_CARE_PROVIDER_SITE_OTHER): Payer: 59 | Admitting: Physical Medicine and Rehabilitation

## 2016-08-27 ENCOUNTER — Encounter: Payer: Self-pay | Admitting: Family Medicine

## 2016-08-27 VITALS — BP 120/82 | HR 94 | Temp 98.3°F | Resp 16 | Ht 60.75 in | Wt 216.2 lb

## 2016-08-27 DIAGNOSIS — M4316 Spondylolisthesis, lumbar region: Secondary | ICD-10-CM

## 2016-08-27 DIAGNOSIS — Z9989 Dependence on other enabling machines and devices: Secondary | ICD-10-CM

## 2016-08-27 DIAGNOSIS — E7800 Pure hypercholesterolemia, unspecified: Secondary | ICD-10-CM

## 2016-08-27 DIAGNOSIS — E1142 Type 2 diabetes mellitus with diabetic polyneuropathy: Secondary | ICD-10-CM | POA: Diagnosis not present

## 2016-08-27 DIAGNOSIS — G2581 Restless legs syndrome: Secondary | ICD-10-CM

## 2016-08-27 DIAGNOSIS — J301 Allergic rhinitis due to pollen: Secondary | ICD-10-CM

## 2016-08-27 DIAGNOSIS — G4733 Obstructive sleep apnea (adult) (pediatric): Secondary | ICD-10-CM

## 2016-08-27 DIAGNOSIS — F324 Major depressive disorder, single episode, in partial remission: Secondary | ICD-10-CM

## 2016-08-27 NOTE — Patient Instructions (Addendum)
IF you received an x-ray today, you will receive an invoice from [facility]. Please contact [facility] at [phone] with questions or concerns regarding your invoice.   IF you received labwork today, you will receive an invoice from [employer]. Please contact [facility] at [phone] with questions or concerns regarding your invoice.   Our billing staff will not be able to assist you with questions regarding bills from these companies.  You will be contacted with the lab results as soon as they are available. The fastest way to get your results is to activate your My Chart account. Instructions are located on the last page of this paperwork. If you have not heard from [name] regarding the results in 2 weeks, please contact this office.     Diabetes and Exercise Exercising regularly is important. It is not just about losing weight. It has many health benefits, such as:  Improving your overall fitness, flexibility, and endurance.  Increasing your bone density.  Helping with weight control.  Decreasing your body fat.  Increasing your muscle strength.  Reducing stress and tension.  Improving your overall health. People with diabetes who exercise gain additional benefits because exercise:  Reduces appetite.  Improves the body's use of blood sugar (glucose).  Helps lower or control blood glucose.  Decreases blood pressure.  Helps control blood lipids (such as cholesterol and triglycerides).  Improves the body's use of the hormone insulin by:  Increasing the body's insulin sensitivity.  Reducing the body's insulin needs.  Decreases the risk for heart disease because exercising:  Lowers cholesterol and triglycerides levels.  Increases the levels of good cholesterol (such as high-density lipoproteins [HDL]) in the body.  Lowers blood glucose levels. YOUR ACTIVITY PLAN  Choose an activity that you enjoy, and set realistic  goals. To exercise safely, you should begin practicing any new physical activity slowly, and gradually increase the intensity of the exercise over time. Your health care provider or diabetes educator can help create an activity plan that works for you. General recommendations include:  Encouraging children to engage in at least 60 minutes of physical activity each day.  Stretching and performing strength training exercises, such as yoga or weight lifting, at least 2 times per week.  Performing a total of at least 150 minutes of moderate-intensity exercise each week, such as brisk walking or water aerobics.  Exercising at least 3 days per week, making sure you allow no more than 2 consecutive days to pass without exercising.  Avoiding long periods of inactivity (90 minutes or more). When you have to spend an extended period of time sitting down, take frequent breaks to walk or stretch. RECOMMENDATIONS FOR EXERCISING WITH TYPE 1 OR TYPE 2 DIABETES   Check your blood glucose before exercising. If blood glucose levels are greater than 240 mg/dL, check for urine ketones. Do not exercise if ketones are present.  Avoid injecting insulin into areas of the body that are going to be exercised. For example, avoid injecting insulin into:  The arms when playing tennis.  The legs when jogging.  Keep a record of:  Food intake before and after you exercise.  Expected peak times of insulin action.  Blood glucose levels before and after you exercise.  The type and amount of exercise you have done.  Review your records with your health care provider. Your health care provider will help you to develop guidelines for adjusting food intake and insulin amounts before and after exercising.  If you take insulin or oral hypoglycemic agents, watch for signs and symptoms of hypoglycemia. They include:  Dizziness.  Shaking.  Sweating.  Chills.  Confusion.  Drink plenty of water while you exercise to  prevent dehydration or heat stroke. Body water is lost during exercise and must be replaced.  Talk to your health care provider before starting an exercise program to make sure it is safe for you. Remember, almost any type of activity is better than none.   This information is not intended to replace advice given to you by your health care provider. Make sure you discuss any questions you have with your health care provider.   Document Released: 01/18/2004 Document Revised: 03/14/2015 Document Reviewed: 04/06/2013 Elsevier Interactive Patient Education [employer].

## 2016-08-27 NOTE — Progress Notes (Signed)
Subjective:    Patient ID: Cristina [name], female    DOB: April 24, 1961, 55 y.o.   MRN: [id number]  08/27/2016  Diabetes   HPI This 55 y.o. female presents for three month follow-up of DMII, hypertension, hyperlipidemia.   Wt Readings from Last 3 Encounters:  08/27/16 216 lb 3.2 oz (98.1 kg)  08/15/16 219 lb 9.6 oz (99.6 kg)  06/20/16 219 lb (99.3 kg)    BP Readings from Last 3 Encounters:  08/27/16 120/82  08/15/16 116/60  06/20/16 118/78    Added Victoza at last visit; sugar 153 this morning after eating 2-3 cookies macadamia nuts, candy bar large butterfinger.  Last eye exam 11/2015 [facility].  Sugars usually run 96-153; most have been less than 126.  Doing much better.  Weight prior to starting Victoza is 227.  Weight down 11 pounds in four months.  Appetite is down.    Not taking Ambien; weaned self off; stable emotionally.  In school at night on line.   Depression screen [facility] 2/9 08/27/2016 08/15/2016 05/04/2016 02/19/2016 01/24/2016  Decreased Interest 0 1 0 0 0  Down, Depressed, Hopeless 0 1 0 0 0  PHQ - 2 Score 0 2 0 0 0  Altered sleeping - - - - -  Tired, decreased energy - - - - -  Change in appetite - - - - -  Feeling bad or failure about yourself  - - - - -  Trouble concentrating - - - - -  Moving slowly or fidgety/restless - - - - -  Suicidal thoughts - - - - -  PHQ-9 Score - - - - -  Some recent data might be hidden   Lumbar bulging disc: Neurontin; scheduled for hip injection today.  Injection in lumbar spine did not help.  Also having B hip pain.  Has appointment with pain management physician; considering appointment with neurology.  No physical therapy.  Shot was very painful.  Dr. Ernestina Patches.    Review of Systems  Constitutional: Negative for chills, diaphoresis, fatigue and fever.  HENT: Positive for congestion, postnasal drip, rhinorrhea and sneezing. Negative for sinus pressure, trouble swallowing and voice change.   Eyes: Negative for visual disturbance.    Respiratory: Negative for cough and shortness of breath.   Cardiovascular: Negative for chest pain, palpitations and leg swelling.  Gastrointestinal: Negative for abdominal pain, constipation, diarrhea, nausea and vomiting.  Endocrine: Negative for cold intolerance, heat intolerance, polydipsia, polyphagia and polyuria.  Musculoskeletal: Positive for arthralgias, back pain and joint swelling.  Neurological: Negative for dizziness, tremors, seizures, syncope, facial asymmetry, speech difficulty, weakness, light-headedness, numbness and headaches.  Psychiatric/Behavioral: Negative for dysphoric mood, self-injury, sleep disturbance and suicidal ideas. The patient is not nervous/anxious.     Past Medical History:  Diagnosis Date  . Allergic rhinitis, cause unspecified   . Arthritis   . Constipation   . CTS (carpal tunnel syndrome)   . Cystocele   . Depression   . Diabetes mellitus   . Dysfunction of eustachian tube   . Fatty liver   . Fissure, anal   . Genital herpes   . GERD (gastroesophageal reflux disease)   . Hemorrhoid   . Hx of migraine headaches   . Hyperlipidemia   . Hypertension   . Insomnia   . Iron deficiency anemia, unspecified   . Obesity   . OSA on CPAP   . Rectocele    Past Surgical History:  Procedure Laterality Date  . ABDOMINAL HYSTERECTOMY  11/11/2010  [place] Plan.  Ovaries intact.  Uterine fibroids with DUB.  [name] CARPAL TUNNEL RELEASE  1989   Left  . CHOLECYSTECTOMY    . CHOLECYSTECTOMY    . COLONOSCOPY  06/29/2012   normal.  Repeat in 5 years.  [name] [place]  . ESOPHAGOGASTRODUODENOSCOPY  12/13/2011   dysphagia.  Henrene Pastor.  Normal.  . Sleep study  09/11/2012   severe OSA.  CPAP titration at 12 cm water pressure.  . TUBAL LIGATION  1988   Allergies  Allergen Reactions  . Fish Oil Rash   Current Outpatient Prescriptions  Medication Sig Dispense Refill  . acyclovir (ZOVIRAX) 400 MG tablet TAKE 1 TABLET BY MOUTH 2 TIMES DAILY. 180 tablet  3  . albuterol (PROAIR HFA) 108 (90 Base) MCG/ACT inhaler Inhale 2 puffs into the lungs every 6 (six) hours as needed. 18 g 1  . ALPRAZolam (XANAX) 0.5 MG tablet TAKE 1 TABLET BY MOUTH ONCE DAILY AS NEEDED FOR ANXIETY 30 tablet 5  . ANUCORT-HC 25 MG suppository PLACE 1 SUPPOSITORY RECTALLY 2 TIMES DAILY AS NEEDED FOR HEMORRHOIDS. 25 suppository 5  . aspirin EC 81 MG tablet Take 81 mg by mouth daily.    [name] azelastine (ASTELIN) 0.1 % nasal spray PLACE 2 SPRAYS INTO BOTH NOSTRILS 2 TIMES DAILY AS DIRECTED 90 mL 3  . azelastine (OPTIVAR) 0.05 % ophthalmic solution   3  . BIOTIN 5000 PO Take by mouth.    [name] buPROPion (WELLBUTRIN SR) 100 MG 12 hr tablet Take 1 tablet (100 mg total) by mouth daily. 90 tablet 3  . Calcium Carbonate-Vitamin D (CALCIUM + D PO) Take 1 tablet by mouth daily.    . canagliflozin (INVOKANA) 300 MG TABS tablet Take 1 tablet (300 mg total) by mouth daily before breakfast. 90 tablet 3  . diltiazem 2 % GEL Apply 1 application topically 5 (five) times daily. 1 Package 3  . docusate sodium (COLACE) 100 MG capsule Take 100 mg by mouth daily. Reported on 05/04/2016    . ferrous sulfate 325 (65 FE) MG EC tablet Take 325 mg by mouth daily.     . fluticasone (FLONASE) 50 MCG/ACT nasal spray PLACE 2 SPRAYS INTO BOTH NOSTRILS DAILY. 48 g 3  . gabapentin (NEURONTIN) 100 MG capsule Take 100 mg by mouth 3 (three) times daily. 200mg  morning 100mg  lunch 200mg  bedtime    . glipiZIDE (GLIPIZIDE XL) 10 MG 24 hr tablet TAKE 1 TABLET BY MOUTH DAILY WITH BREAKFAST. 90 tablet 3  . Glucosamine HCl 1000 MG TABS Take 1,000 mg by mouth 2 (two) times daily with a meal.     . glucose blood (TRUE METRIX BLOOD GLUCOSE TEST) test strip 1 each by Other route 2 (two) times daily as needed for other. Use as instructed    . hydrocortisone 2.5 % ointment Apply topically 2 (two) times daily. 30 g 0  . Insulin Pen Needle (PEN NEEDLES) 31G X 5 MM MISC 1 each by Does not apply route daily. USE TO INJECT VICTOZA DAILY AS  DIRECTED 100 each 3  . levocetirizine (XYZAL) 5 MG tablet   3  . Liraglutide 18 MG/3ML SOPN Inject 0.2 mLs (1.2 mg total) into the skin daily. 6 mL 11  . lisinopril (PRINIVIL,ZESTRIL) 5 MG tablet Take 1 tablet (5 mg total) by mouth daily. 90 tablet 3  . metFORMIN (GLUCOPHAGE) 1000 MG tablet Take 1 tablet (1,000 mg total) by mouth 2 (two) times daily with a meal. 180 tablet 3  .  montelukast (SINGULAIR) 10 MG tablet Take 1 tablet (10 mg total) by mouth at bedtime. 90 tablet 3  . Multiple Vitamin (MULTIVITAMIN) capsule Take 1 capsule by mouth daily.    . pantoprazole (PROTONIX) 40 MG tablet TAKE 1 TABLET BY MOUTH 2 TIMES DAILY. 180 tablet 3  . rOPINIRole (REQUIP) 1 MG tablet TAKE 1 TABLET BY MOUTH EVERY NIGHT AT BEDTIME. 90 tablet 3  . sertraline (ZOLOFT) 100 MG tablet TAKE 1 & 1/2 TABLET BY MOUTH DAILY 135 tablet 3  . simvastatin (ZOCOR) 40 MG tablet TAKE 1 TABLET BY MOUTH DAILY AT 6 PM. 90 tablet 3  . TRUEPLUS LANCETS 30G MISC USE TO CHECK BLOOD GLUCOSE DAILY AS DIRECTED 100 each 3  . zolpidem (AMBIEN) 5 MG tablet TAKE 1 TABLET BY MOUTH ONCE DAILY AT BEDTIME AS NEEDED 30 tablet 5  . AMBULATORY NON FORMULARY MEDICATION Reported on 10/26/2015    . phentermine (ADIPEX-P) 37.5 MG tablet Take 37.5 mg by mouth daily before breakfast.    . pseudoephedrine-guaifenesin (MUCINEX D) 60-600 MG 12 hr tablet Take 1 tablet by mouth every 12 (twelve) hours. (Patient not taking: Reported on 08/27/2016) 20 tablet 0   No current facility-administered medications for this visit.    Social History   Social History  . Marital status: Single    Spouse name: n/a  . Number of children: 3  . Years of education: college   Occupational History  . [employer]   Social History Main Topics  . Smoking status: Former Research scientist (life sciences)  . Smokeless tobacco: Never Used     Comment: smoked occasionally longest 6 mos.  . Alcohol use No  . Drug use: No  . Sexual activity: No    Other Topics Concern  . Not on file   Social History Narrative      Marital status:  Divorced in 2000 after six years of marriage; not dating in 2017.      Children:  3 daughters (38, 64, 41); 6 grandchildren.      Lives: alone.  2 dogs; grandson stays some.      Employment:  Working at [employer]. 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      Tobacco: none      Alcohol: socially; special occasions.  Rare.      Drugs: none      Exercise:  Sporadic; stationary bike.      Sexual activity:  Not sexually active since 2011.       Guns:  No guns in the home.       Smoke detectors in use,       Seatbelt:  Sometimes uses seat belts. 75% of time.  No texting while driving.     Family History  Problem Relation Age of Onset  . Hypertension Mother 87  . Osteoarthritis Mother   . Diabetes Mother   . Colon cancer Mother 68  . Cancer Mother 70    colon cancer  . Colon cancer Paternal Grandmother   . Cancer Paternal Grandmother   . Stroke Maternal Grandmother   . Colon polyps Brother     x 2 brother  . Diabetes Daughter   . Hypertension Daughter   . Sleep apnea Daughter   . Mental illness Daughter     depression  . Migraines Daughter   . Colon polyps Brother   . Cirrhosis Brother     alcoholism  . Alcohol abuse Brother   . Migraines  Daughter   . Mental illness Daughter     anxiety attacks  . Protein S deficiency Daughter   . Sleep apnea Brother   . Hyperlipidemia Brother   . Colon cancer Paternal Aunt        Objective:    BP 120/82   Pulse 94   Temp 98.3 F (36.8 C) (Oral)   Resp 16   Ht 5' 0.75" (1.543 m)   Wt 216 lb 3.2 oz (98.1 kg)   SpO2 96%   BMI 41.19 kg/m  Physical Exam  Constitutional: She is oriented to person, place, and time. She appears well-developed and well-nourished. No distress.  HENT:  Head: Normocephalic and atraumatic.  Right Ear: External ear normal.  Left Ear: External ear normal.  Nose: Nose normal.  Mouth/Throat: Oropharynx is clear and  moist.  Eyes: Conjunctivae and EOM are normal. Pupils are equal, round, and reactive to light.  Neck: Normal range of motion. Neck supple. Carotid bruit is not present. No thyromegaly present.  Cardiovascular: Normal rate, regular rhythm, normal heart sounds and intact distal pulses.  Exam reveals no gallop and no friction rub.   No murmur heard. Pulmonary/Chest: Effort normal and breath sounds normal. She has no wheezes. She has no rales.  Abdominal: Soft. Bowel sounds are normal. She exhibits no distension and no mass. There is no tenderness. There is no rebound and no guarding.  Lymphadenopathy:    She has no cervical adenopathy.  Neurological: She is alert and oriented to person, place, and time. No cranial nerve deficit.  Skin: Skin is warm and dry. No rash noted. She is not diaphoretic. No erythema. No pallor.  Psychiatric: She has a normal mood and affect. Her behavior is normal.        Assessment & Plan:   1. Type 2 diabetes mellitus with diabetic polyneuropathy, without long-term current use of insulin ([place])   2. Pure hypercholesterolemia   3. Major depressive disorder with single episode, in partial remission ([place])   4. Restless leg syndrome   5. OSA on CPAP   6. Chronic seasonal allergic rhinitis due to pollen    -uncontrolled DMII: tolerating Victoza well; will likely warrant increasing dose of Victoza; obtain labs. -doing well emotionally. -consultation with pain management upcoming for chronic lower back pain. -continue current medications.   Orders Placed This Encounter  Procedures  . CBC with Differential/Platelet  . Comprehensive metabolic panel    Order Specific Question:   Has the patient fasted?    Answer:   Yes  . Lipid panel    Order Specific Question:   Has the patient fasted?    Answer:   Yes  . Hemoglobin A1c  . HM Diabetes Foot Exam   No orders of the defined types were placed in this encounter.   Return in about 3 months (around 11/27/2016) for  recheck diabetes, high blood pressure.   Estefania Kamiya Elayne Guerin, M.D. [facility] [address] [place], [place]  [address] [phone] phone [phone] fax

## 2016-08-28 LAB — COMPREHENSIVE METABOLIC PANEL
ALK PHOS: 82 U/L (ref 33–130)
ALT: 39 U/L — AB (ref 6–29)
AST: 25 U/L (ref 10–35)
Albumin: 3.7 g/dL (ref 3.6–5.1)
BILIRUBIN TOTAL: 0.4 mg/dL (ref 0.2–1.2)
BUN: 14 mg/dL (ref 7–25)
CO2: 27 mmol/L (ref 20–31)
CREATININE: 0.81 mg/dL (ref 0.50–1.05)
Calcium: 9.3 mg/dL (ref 8.6–10.4)
Chloride: 102 mmol/L (ref 98–110)
GLUCOSE: 123 mg/dL — AB (ref 65–99)
Potassium: 4.6 mmol/L (ref 3.5–5.3)
Sodium: 140 mmol/L (ref 135–146)
TOTAL PROTEIN: 7.3 g/dL (ref 6.1–8.1)

## 2016-08-28 LAB — CBC WITH DIFFERENTIAL/PLATELET
BASOS PCT: 0 %
Basophils Absolute: 0 cells/uL (ref 0–200)
Eosinophils Absolute: 138 cells/uL (ref 15–500)
Eosinophils Relative: 1 %
HCT: 41.8 % (ref 35.0–45.0)
Hemoglobin: 13.7 g/dL (ref 11.7–15.5)
LYMPHS PCT: 34 %
Lymphs Abs: 4692 cells/uL — ABNORMAL HIGH (ref 850–3900)
MCH: 27.5 pg (ref 27.0–33.0)
MCHC: 32.8 g/dL (ref 32.0–36.0)
MCV: 83.8 fL (ref 80.0–100.0)
MONO ABS: 690 {cells}/uL (ref 200–950)
MONOS PCT: 5 %
MPV: 9.7 fL (ref 7.5–12.5)
NEUTROS ABS: 8280 {cells}/uL — AB (ref 1500–7800)
Neutrophils Relative %: 60 %
PLATELETS: 328 10*3/uL (ref 140–400)
RBC: 4.99 MIL/uL (ref 3.80–5.10)
RDW: 15.7 % — AB (ref 11.0–15.0)
WBC: 13.8 10*3/uL — AB (ref 3.8–10.8)

## 2016-08-28 LAB — HEMOGLOBIN A1C
Hgb A1c MFr Bld: 7.1 % — ABNORMAL HIGH (ref <5.7)
Mean Plasma Glucose: 157 mg/dL

## 2016-08-28 LAB — LIPID PANEL
CHOL/HDL RATIO: 3.7 ratio (ref <=5.0)
Cholesterol: 172 mg/dL (ref 125–200)
HDL: 46 mg/dL (ref >=46)
LDL Cholesterol: 103 mg/dL (ref <130)
Triglycerides: 116 mg/dL (ref <150)
VLDL: 23 mg/dL (ref <30)

## 2016-08-28 MED FILL — GABAPENTIN 100 MG CAPSULE: 100 | 30 days supply | Qty: 150 | Fill #1

## 2016-08-29 MED FILL — AMOX-CLAV 875-125 MG TABLET: 875-125 | 10 days supply | Qty: 20 | Fill #0

## 2016-09-05 ENCOUNTER — Telehealth (INDEPENDENT_AMBULATORY_CARE_PROVIDER_SITE_OTHER): Payer: Self-pay | Admitting: *Deleted

## 2016-09-05 ENCOUNTER — Encounter: Payer: Self-pay | Admitting: Family Medicine

## 2016-09-05 NOTE — Telephone Encounter (Signed)
PT. Requesting office notes and copy of x-rays. Pt callback number is [phone]

## 2016-09-06 NOTE — Telephone Encounter (Signed)
Will you please get patient records together. Thanks.

## 2016-09-09 NOTE — Telephone Encounter (Signed)
Please see pts initial msg. She wants records & xrays. Please process xray request. Thank you. I called patient and left msg that copy of her records are ready and that I was forwarding her request on to the xray department for them to process her request for xrays and that someone would call her when those are ready.

## 2016-09-11 ENCOUNTER — Telehealth (INDEPENDENT_AMBULATORY_CARE_PROVIDER_SITE_OTHER): Payer: Self-pay | Admitting: Radiology

## 2016-09-11 DIAGNOSIS — M545 Low back pain, unspecified: Secondary | ICD-10-CM

## 2016-09-11 DIAGNOSIS — G8929 Other chronic pain: Secondary | ICD-10-CM

## 2016-09-11 NOTE — Telephone Encounter (Signed)
I called and advised pt CD is ready for pick up at the front desk.

## 2016-09-11 NOTE — Telephone Encounter (Signed)
Patient would like a call back regarding a referral to a Neurologist.

## 2016-09-12 NOTE — Telephone Encounter (Signed)
Referral placed for neurology eval.

## 2016-09-16 MED FILL — SIMVASTATIN 40 MG TABLET: 40 | 90 days supply | Qty: 90 | Fill #2

## 2016-09-16 MED FILL — BUPROPION HCL SR 100 MG TAB: 100 | 90 days supply | Qty: 90 | Fill #1

## 2016-09-16 MED FILL — glipiZIDE XL 10 MG TB24: 10 | 90 days supply | Qty: 90 | Fill #1

## 2016-09-16 MED FILL — ACYCLOVIR 400 MG TABLET: 400 | 90 days supply | Qty: 180 | Fill #2

## 2016-09-19 ENCOUNTER — Other Ambulatory Visit: Payer: Self-pay | Admitting: Pharmacist

## 2016-09-19 ENCOUNTER — Encounter: Payer: Self-pay | Admitting: Pharmacist

## 2016-09-19 VITALS — BP 108/74 | Wt 222.0 lb

## 2016-09-19 DIAGNOSIS — E118 Type 2 diabetes mellitus with unspecified complications: Secondary | ICD-10-CM

## 2016-09-19 MED FILL — VICTOZA 2-PAK 18 MG/3 ML PE: 18 | 30 days supply | Qty: 6 | Fill #4

## 2016-09-19 MED FILL — SM ALCOHOL 70% PREP PADS: 70 | 30 days supply | Qty: 100 | Fill #0

## 2016-09-19 MED FILL — TRUE METRIX GLUCOSE TEST ST: 33 days supply | Qty: 100 | Fill #1

## 2016-09-19 NOTE — Patient Outreach (Signed)
[facility]) Care Management  09/19/2016  Cristina [name] October 09, 1961 [id number]    Subjective:  Patient is a 55 yo female with type 2 diabetes who presents today for follow-up as part of the employer-sponsored Link to Well[facility] program. Current diabetes regimen includes Invokana, glipizide ER, metformin, and Victoza . Patient also continues on daily ASA, ACEi, and statin. Most recent MD follow-up was Oct 2017. Patient has a pending appt for Jan 2018.      Diabetes Assessment:  No changes to diabetes regimen.  Patient continues tolerating Victoza well.  She also continues doing well on Invokana 300 mg dose.  Patient reports good medication compliance, but she does forget a dose occasionally.  Most recent A1c was much improved (due to victoza and positive dietary changes) at 7.1% (prev 8.4%), which is close to reaching goal of less than 7%.  Weight has increased by 6 lb since since last PCP visit due to recent dietary deterioration.  She is no longer on phentermine, but is considering resuming.  She was interested in gastric sleeve but out of pocket costs will be too much for this patient to afford and she is unable to have the procedure at this time.  Patient did not bring meter but continues testing 1-2 times daily per MD and nutritionist recommendation.  Per pt report fasting glucose averages 120s or below, with occassional readings as high as 140s.  She uses [employer] and keeps one meter at work and one at home.  Hypoglycemia is rare.  Patient has not had recent hypoglycemic episodes, but has an occassional hypo 1-2 times per month if she skips breakfast.  Patient reports continued mild signs and symptoms of neuropathy including tingling and strange, sometimes painful sensation on tops of toes, both feet, specifically if she rubs them on something (ex: carpet) or hits her foot against something.  Pt dose report that symptoms are less frequent with improved A1c.  Patient reports  MD is aware.  No signs of infection.  Patient is up to date on eye and dental exam.         Lifestyle Assessment:  Exercising - None at this time due to lack of motivation and schoolwork (currently enrolled in 2 college courses).  She still owns a stationary bike and would like to resume using it but does not know if this is a real possibility at this time.   Diet -  Patient reports overall improvement in diet recently and has recently seen a nutritionist.  She is making better choices overall, example chicken vs beef.  Portion sizes have improved and she is now counting carbs and limiting to 45 per meal.  Also limiting sugary drinks and koolaid, drinking fewer sodas (although there has been an uptick in this lately).  Food recall: Breakfast - ran out of protein shake, but is now eating cereal/milk (sugar smacks, cocoa crispies, cheerios) and fruit (orange or apple or grape) -  We have discussed choosing healthier (high protein, high fiber) cereals Snack - fruit and protein (ex: apple and cheese) Lunch - Leftovers, ex: pork chop, sweet potato, sauteed okra Snack - fruit if anything Supper - cooking at home some and fast food some depending on time Snack - increased desserts and sweets recently (ex: pound cake) Beverages - water, soda has increased recently, rare, orange juice on occasion, sugar-free koolaid (slenda), milk over cereal   Plan and Goals: 1)  Continue testing regularly, good job with improvement in this area 2)  Continue to make healthy dietary choices, monitoring portion control and counting carbs, especially during holidays 3)  Resume riding stationary bike daily for at least 15 minutes  4)  Goal of maintaining improved A1c  5)  Thank you for joining [facility], a representative will contact you soon   Great to see you today!  Tilman Neat, PharmD [facility]  [phone]

## 2016-10-10 ENCOUNTER — Encounter (INDEPENDENT_AMBULATORY_CARE_PROVIDER_SITE_OTHER): Payer: Self-pay | Admitting: Specialist

## 2016-10-10 ENCOUNTER — Ambulatory Visit (INDEPENDENT_AMBULATORY_CARE_PROVIDER_SITE_OTHER): Payer: 59 | Admitting: Specialist

## 2016-10-10 VITALS — BP 117/75 | HR 88 | Ht 61.0 in | Wt 222.0 lb

## 2016-10-10 DIAGNOSIS — M461 Sacroiliitis, not elsewhere classified: Secondary | ICD-10-CM

## 2016-10-10 DIAGNOSIS — M48062 Spinal stenosis, lumbar region with neurogenic claudication: Secondary | ICD-10-CM

## 2016-10-10 NOTE — Patient Instructions (Signed)
Proceed with diagnostic left sacroiliac joint injection. Please pay close attention to how yourpain is the first several hours after the injection.

## 2016-10-10 NOTE — Progress Notes (Signed)
Office Visit Note   Patient: Cristina [name]           Date of Birth: March 01, 1961           MRN: [id number] Visit Date: 10/10/2016              Requested by: [name], MD [address] [place], [place] [address] PCP: Reginia Forts, MD   Assessment & Plan: Visit Diagnoses:  1. SI (sacroiliac) joint inflammation ([place])   2. Spinal stenosis of lumbar region with neurogenic claudication     Plan: We'll schedule patient for left diagnostic/therapeutic SI joint injection. Advised patient to pay close attention to how she feels the first several hours. Follow-up one week after the injection has been performed. All questions answered.  Follow-Up Instructions: Return for Follow-up 1 week after left SI joint with Dr.nitka.   Orders:  No orders of the defined types were placed in this encounter.  No orders of the defined types were placed in this encounter.     Procedures: No procedures performed   Clinical Data: No additional findings.   Subjective: Chief Complaint  Patient presents with  . Lower Back - Pain, Follow-up    Patient is returning after lumbar trans injection done with Dr. Ernestina Patches on 08/08/2016. States injection was no help. Feels like the injection actually made it worse. States she has good and bad days.     Review of Systems  Constitutional: Negative.   Gastrointestinal: Negative.   Psychiatric/Behavioral: Negative.      Objective: Vital Signs: BP 117/75   Pulse 88   Ht 5\' 1"  (1.549 m)   Wt 222 lb (100.7 kg)   BMI 41.95 kg/m   Physical Exam  Constitutional: She is oriented to person, place, and time. No distress.  HENT:  Head: Normocephalic.  Eyes: EOM are normal. Pupils are equal, round, and reactive to light.  Neck: Normal range of motion.  Pulmonary/Chest: No respiratory distress.  Neurological: She is alert and oriented to person, place, and time.  Skin: Skin is warm and dry.  Psychiatric: She has a normal mood and affect.    Ortho  Exam Patient is moderate to markedly tender over the left SI joint pain and lesser over the right side. No low back pain with lumbar extension. She is neurovascularly intact. Skin warm and dry. Specialty Comments:  No specialty comments available.  Imaging: No results found.   PMFS History: Patient Active Problem List   Diagnosis Date Noted  . Left shoulder pain 06/02/2015  . Insomnia 05/12/2014  . Fibromyalgia 05/12/2014  . Allergic rhinitis 03/28/2014  . Osteoarthritis 12/20/2013  . Cervical strain 06/21/2013  . Low back pain 06/21/2013  . Metatarsalgia of both feet 03/11/2013  . Chest pain 01/25/2013  . Sciatica 01/25/2013  . Restless leg syndrome 11/24/2012  . Pure hypercholesterolemia 10/20/2012  . OSA on CPAP 10/20/2012  . Depression 10/20/2012  . Other and unspecified hyperlipidemia 10/01/2012  . Iron deficiency anemia, unspecified 10/01/2012  . Diabetes mellitus, type 2 ([place]) 12/31/2010  . ANAL FISSURE 12/31/2010  . Obesity 01/08/2007  . CONSTIPATION 01/08/2007   Past Medical History:  Diagnosis Date  . Allergic rhinitis, cause unspecified   . Arthritis   . Constipation   . CTS (carpal tunnel syndrome)   . Cystocele   . Depression   . Diabetes mellitus   . Dysfunction of eustachian tube   . Fatty liver   . Fissure, anal   . Genital herpes   .  GERD (gastroesophageal reflux disease)   . Hemorrhoid   . Hx of migraine headaches   . Hyperlipidemia   . Hypertension   . Insomnia   . Iron deficiency anemia, unspecified   . Obesity   . OSA on CPAP   . Rectocele     Family History  Problem Relation Age of Onset  . Hypertension Mother 31  . Osteoarthritis Mother   . Diabetes Mother   . Colon cancer Mother 27  . Cancer Mother 61    colon cancer  . Colon cancer Paternal Grandmother   . Cancer Paternal Grandmother   . Stroke Maternal Grandmother   . Colon polyps Brother     x 2 brother  . Diabetes Daughter   . Hypertension Daughter   . Sleep apnea  Daughter   . Mental illness Daughter     depression  . Migraines Daughter   . Colon polyps Brother   . Cirrhosis Brother     alcoholism  . Alcohol abuse Brother   . Migraines Daughter   . Mental illness Daughter     anxiety attacks  . Protein S deficiency Daughter   . Sleep apnea Brother   . Hyperlipidemia Brother   . Colon cancer Paternal Aunt     Past Surgical History:  Procedure Laterality Date  . ABDOMINAL HYSTERECTOMY  11/11/2010   [place] Plan.  Ovaries intact.  Uterine fibroids with DUB.  [name] CARPAL TUNNEL RELEASE  1989   Left  . CHOLECYSTECTOMY    . CHOLECYSTECTOMY    . COLONOSCOPY  06/29/2012   normal.  Repeat in 5 years.  [name] [place]  . ESOPHAGOGASTRODUODENOSCOPY  12/13/2011   dysphagia.  Henrene Pastor.  Normal.  . Sleep study  09/11/2012   severe OSA.  CPAP titration at 12 cm water pressure.  . TUBAL LIGATION  1988   Social History   Occupational History  . [employer]   Social History Main Topics  . Smoking status: Former Research scientist (life sciences)  . Smokeless tobacco: Never Used     Comment: smoked occasionally longest 6 mos.  . Alcohol use No  . Drug use: No  . Sexual activity: No

## 2016-10-10 NOTE — Addendum Note (Signed)
Addended by: Cleda Clarks on: 10/10/2016 10:57 AM   Modules accepted: Orders

## 2016-10-16 MED FILL — PANTOPRAZOLE SOD DR 40 MG T: 40 | 90 days supply | Qty: 180 | Fill #0

## 2016-10-16 MED FILL — GABAPENTIN 100 MG CAPSULE: 100 | 30 days supply | Qty: 150 | Fill #2

## 2016-10-17 ENCOUNTER — Ambulatory Visit: Payer: 59 | Admitting: Neurology

## 2016-10-21 ENCOUNTER — Encounter (INDEPENDENT_AMBULATORY_CARE_PROVIDER_SITE_OTHER): Payer: Self-pay | Admitting: Physical Medicine and Rehabilitation

## 2016-10-21 ENCOUNTER — Ambulatory Visit (INDEPENDENT_AMBULATORY_CARE_PROVIDER_SITE_OTHER): Payer: 59 | Admitting: Physical Medicine and Rehabilitation

## 2016-10-21 VITALS — BP 109/75

## 2016-10-21 DIAGNOSIS — M461 Sacroiliitis, not elsewhere classified: Secondary | ICD-10-CM

## 2016-10-21 MED ORDER — METHYLPREDNISOLONE ACETATE 80 MG/ML IJ SUSP
80.0000 mg | Freq: Once | INTRAMUSCULAR | Status: AC
  Administered 2016-10-21: 80 mg

## 2016-10-21 MED ORDER — LIDOCAINE HCL (PF) 1 % IJ SOLN
0.3300 mL | Freq: Once | INTRAMUSCULAR | Status: AC
  Administered 2016-10-21: 0.3 mL

## 2016-10-21 NOTE — Procedures (Signed)
Sacroiliac Joint Intra-Articular Injection - Posterior Approach with Fluoroscopic Guidance   Patient: Cristina [name]      Date of Birth: 30-Jan-1961 MRN: [id number] PCP: Reginia Forts, MD      Visit Date: 10/21/2016   Universal Protocol:    Date/Time: 12/11/178:40 AM  Consent Given By: the patient  Position: PRONE  Additional Comments: Vital signs were monitored before and after the procedure. Patient was prepped and draped in the usual sterile fashion. The correct patient, procedure, and site was verified.   Injection Procedure Details:  Procedure Site One Meds Administered:  Meds ordered this encounter  Medications  . lidocaine (PF) (XYLOCAINE) 1 % injection 0.3 mL  . methylPREDNISolone acetate (DEPO-MEDROL) injection 80 mg     Laterality: Left  Location/Site:  SI joint  Needle size: 22 G  Needle type: Spinal  Needle Placement: Intra-articular  Findings:  -Contrast Used: 1 mL iohexol 180 mg iodine/mL   -Comments: Excellent flow of contrast producing a partial arthrogram.  Procedure Details: Starting with a 90 degree vertical and midline orientation the fluoroscope was tilted cranially 20 to 25 degrees and the target area of the inferior most part of the SI joint on the side mentioned above was visualized.  The soft tissues overlying this target were infiltrated with 4 ml. of 1% Lidocaine without Epinephrine. A #22 gauge spinal needle was inserted perpendicular to the fluoroscope table and advanced into the posterior inferior joint space using fluoroscopic guidance.  Position in the joint space was confirmed by obtaining a partial arthrogram using a 2 ml. volume of Isovue-250 contrast agent. After negative aspirate for gross pus or blood, the injectate was delivered to the joint. Radiographs were obtained for documentation purposes.   Additional Comments:  The patient tolerated the procedure well Dressing: Band-Aid    Post-procedure details: Patient was observed  during the procedure. Post-procedure instructions were reviewed.  Patient left the clinic in stable condition.

## 2016-10-21 NOTE — Progress Notes (Signed)
ZAIDYN BARTUNEK - 55 y.o. female MRN [id number]  Date of birth: Jan 02, 1961  Office Visit Note: Visit Date: 10/21/2016 PCP: Reginia Forts, MD Referred by: [name], MD  Subjective: Chief Complaint  Patient presents with  . Lower Back - Pain   HPI: Ms. Franceschini is a 55 year old female that we saw a few months ago and completed L4 transforaminal injection which was not very beneficial. She did have an MRI showing changes at this level with some lateral recess narrowing. She did get much relief from that of Dr. Louanne Skye requests a diagnostic sacroiliac joint injection with fluoroscopic guidance.eft sided lower back pain. Radiates down left leg. Comes and goes. No numbness or tingling. No contrast allergy.     LROS Otherwise per HPI.  Assessment & Plan: Visit Diagnoses:  1. Sacroiliitis ([place])     Plan: Findings:  Left sacroiliac joint injection with fluoroscopic guidance.    Meds & Orders:  Meds ordered this encounter  Medications  . lidocaine (PF) (XYLOCAINE) 1 % injection 0.3 mL  . methylPREDNISolone acetate (DEPO-MEDROL) injection 80 mg    Orders Placed This Encounter  Procedures  . Nerve Block    Follow-up: Return for Dr. Louanne Skye first avail.   Procedures: No procedures performed  Sacroiliac Joint Intra-Articular Injection - Posterior Approach with Fluoroscopic Guidance   Patient: MACKENZY TWADDLE      Date of Birth: [birth year] MRN: [id number] PCP: Reginia Forts, MD      Visit Date: 10/21/2016   Universal Protocol:    Date/Time: 12/11/178:40 AM  Consent Given By: the patient  Position: PRONE  Additional Comments: Vital signs were monitored before and after the procedure. Patient was prepped and draped in the usual sterile fashion. The correct patient, procedure, and site was verified.   Injection Procedure Details:  Procedure Site One Meds Administered:  Meds ordered this encounter  Medications  . lidocaine (PF) (XYLOCAINE) 1 % injection 0.3 mL  .  methylPREDNISolone acetate (DEPO-MEDROL) injection 80 mg     Laterality: Left  Location/Site:  SI joint  Needle size: 22 G  Needle type: Spinal  Needle Placement: Intra-articular  Findings:  -Contrast Used: 1 mL iohexol 180 mg iodine/mL   -Comments: Excellent flow of contrast producing a partial arthrogram.  Procedure Details: Starting with a 90 degree vertical and midline orientation the fluoroscope was tilted cranially 20 to 25 degrees and the target area of the inferior most part of the SI joint on the side mentioned above was visualized.  The soft tissues overlying this target were infiltrated with 4 ml. of 1% Lidocaine without Epinephrine. A #22 gauge spinal needle was inserted perpendicular to the fluoroscope table and advanced into the posterior inferior joint space using fluoroscopic guidance.  Position in the joint space was confirmed by obtaining a partial arthrogram using a 2 ml. volume of Isovue-250 contrast agent. After negative aspirate for gross pus or blood, the injectate was delivered to the joint. Radiographs were obtained for documentation purposes.   Additional Comments:  The patient tolerated the procedure well Dressing: Band-Aid    Post-procedure details: Patient was observed during the procedure. Post-procedure instructions were reviewed.  Patient left the clinic in stable condition.     Clinical History: No specialty comments available.  She reports that she has quit smoking. She has never used smokeless tobacco.   Recent Labs  01/24/16 1918 05/04/16 0958 08/27/16 1157  HGBA1C 7.5 8.4* 7.1*    Objective:  VS:  HT:    WT:  BMI:     BP:109/75  HR: bpm  TEMP: ( )  RESP:  Physical Exam  Musculoskeletal:  The patient ambulates without aid. She is somewhat slow to rise from a seated position. She has good distal strength.    Ortho Exam Imaging: No results found.  Past Medical/Family/Surgical/Social History: Medications & Allergies  reviewed per EMR Patient Active Problem List   Diagnosis Date Noted  . Left shoulder pain 06/02/2015  . Insomnia 05/12/2014  . Fibromyalgia 05/12/2014  . Allergic rhinitis 03/28/2014  . Osteoarthritis 12/20/2013  . Cervical strain 06/21/2013  . Low back pain 06/21/2013  . Metatarsalgia of both feet 03/11/2013  . Chest pain 01/25/2013  . Sciatica 01/25/2013  . Restless leg syndrome 11/24/2012  . Pure hypercholesterolemia 10/20/2012  . OSA on CPAP 10/20/2012  . Depression 10/20/2012  . Other and unspecified hyperlipidemia 10/01/2012  . Iron deficiency anemia, unspecified 10/01/2012  . Diabetes mellitus, type 2 ([place]) 12/31/2010  . ANAL FISSURE 12/31/2010  . Obesity 01/08/2007  . CONSTIPATION 01/08/2007   Past Medical History:  Diagnosis Date  . Allergic rhinitis, cause unspecified   . Arthritis   . Constipation   . CTS (carpal tunnel syndrome)   . Cystocele   . Depression   . Diabetes mellitus   . Dysfunction of eustachian tube   . Fatty liver   . Fissure, anal   . Genital herpes   . GERD (gastroesophageal reflux disease)   . Hemorrhoid   . Hx of migraine headaches   . Hyperlipidemia   . Hypertension   . Insomnia   . Iron deficiency anemia, unspecified   . Obesity   . OSA on CPAP   . Rectocele    Family History  Problem Relation Age of Onset  . Hypertension Mother 43  . Osteoarthritis Mother   . Diabetes Mother   . Colon cancer Mother 51  . Cancer Mother 51    colon cancer  . Colon cancer Paternal Grandmother   . Cancer Paternal Grandmother   . Stroke Maternal Grandmother   . Colon polyps Brother     x 2 brother  . Diabetes Daughter   . Hypertension Daughter   . Sleep apnea Daughter   . Mental illness Daughter     depression  . Migraines Daughter   . Colon polyps Brother   . Cirrhosis Brother     alcoholism  . Alcohol abuse Brother   . Migraines Daughter   . Mental illness Daughter     anxiety attacks  . Protein S deficiency Daughter   . Sleep  apnea Brother   . Hyperlipidemia Brother   . Colon cancer Paternal Aunt    Past Surgical History:  Procedure Laterality Date  . ABDOMINAL HYSTERECTOMY  11/11/2010   [place] Plan.  Ovaries intact.  Uterine fibroids with DUB.  [name] CARPAL TUNNEL RELEASE  1989   Left  . CHOLECYSTECTOMY    . CHOLECYSTECTOMY    . COLONOSCOPY  06/29/2012   normal.  Repeat in 5 years.  [name] [place]  . ESOPHAGOGASTRODUODENOSCOPY  12/13/2011   dysphagia.  Henrene Pastor.  Normal.  . Sleep study  09/11/2012   severe OSA.  CPAP titration at 12 cm water pressure.  . TUBAL LIGATION  1988   Social History   Occupational History  . [employer]   Social History Main Topics  . Smoking status: Former Research scientist (life sciences)  . Smokeless tobacco: Never Used  Comment: smoked occasionally longest 6 mos.  . Alcohol use No  . Drug use: No  . Sexual activity: No

## 2016-10-21 NOTE — Patient Instructions (Signed)

## 2016-10-22 ENCOUNTER — Telehealth (INDEPENDENT_AMBULATORY_CARE_PROVIDER_SITE_OTHER): Payer: Self-pay | Admitting: *Deleted

## 2016-10-22 NOTE — Telephone Encounter (Signed)
Received fax from [facility] pain Management stating pt was scheduled Dec 11 at 215p w/ Dr. Hardin Negus and pt did not show up, they will no longer be able to see patient.

## 2016-10-24 MED FILL — UNIFINE PENTIPS 31GX3/16": 31G X 5 MM | 90 days supply | Qty: 100 | Fill #2

## 2016-10-24 MED FILL — VICTOZA 2-PAK 18 MG/3 ML PE: 18 | 30 days supply | Qty: 6 | Fill #5

## 2016-10-24 MED FILL — UNIFINE PENTIPS 31GX3/16: 31G X 5 MM | 90 days supply | Qty: 100 | Fill #2

## 2016-10-26 ENCOUNTER — Ambulatory Visit: Payer: 59

## 2016-10-28 ENCOUNTER — Ambulatory Visit (INDEPENDENT_AMBULATORY_CARE_PROVIDER_SITE_OTHER): Payer: 59 | Admitting: Specialist

## 2016-10-28 ENCOUNTER — Encounter (INDEPENDENT_AMBULATORY_CARE_PROVIDER_SITE_OTHER): Payer: Self-pay | Admitting: Specialist

## 2016-10-28 VITALS — BP 102/70 | HR 103 | Ht 67.0 in | Wt 220.0 lb

## 2016-10-28 DIAGNOSIS — G8929 Other chronic pain: Secondary | ICD-10-CM | POA: Diagnosis not present

## 2016-10-28 DIAGNOSIS — M533 Sacrococcygeal disorders, not elsewhere classified: Secondary | ICD-10-CM

## 2016-10-28 DIAGNOSIS — M5136 Other intervertebral disc degeneration, lumbar region: Secondary | ICD-10-CM | POA: Diagnosis not present

## 2016-10-28 NOTE — Patient Instructions (Signed)
Avoid bending, stooping and avoid lifting weights greater than 10 lbs. Avoid prolong standing and walking. Avoid frequent bending and stooping  No lifting greater than 10 lbs. May use ice or moist heat for pain. Weight loss is of benefit. Handicap license is approved. Dr. Romona Curls secretary/Assistant will call to arrange for left SI RFA. Will start physical therapy directed at trying to relieve the SI pain without sugery.

## 2016-10-28 NOTE — Progress Notes (Signed)
Office Visit Note   Patient: Cristina [name]           Date of Birth: September 30, 1961           MRN: [id number] Visit Date: 10/28/2016              Requested by: [name], MD [address] [place], [place] [address] PCP: Reginia Forts, MD   Assessment & Plan: Visit Diagnoses:  1. Degenerative disc disease, lumbar   2. Chronic left sacroiliac pain     Plan: Avoid bending, stooping and avoid lifting weights greater than 10 lbs. Avoid prolong standing and walking. Avoid frequent bending and stooping  No lifting greater than 10 lbs. May use ice or moist heat for pain. Weight loss is of benefit. Handicap license is approved. Dr. Romona Curls secretary/Assistant will call to arrange for left SI RFA. Will start physical therapy directed at trying to relieve the SI pain without sugery.  Follow-Up Instructions: No Follow-up on file.   Orders:  No orders of the defined types were placed in this encounter.  No orders of the defined types were placed in this encounter.     Procedures: No procedures performed   Clinical Data: No additional findings.   Subjective: Chief Complaint  Patient presents with  . Lower Back - Pain    Patient returns for follow up after left SI joint injection on 10/21/2016. She states that she only got about 4 days relief. She wakes up hurting. She takes Tylenol for pain as needed with some relief. She also uses the heating pad.Had the injection on Monday and by Thursday the pain was recurring into the left SI joint. No bowel or bladder incontinence. Does has pain with valsalva at toilet. History of IBS. 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     Review of Systems  HENT: Negative.   Eyes: Negative.   Respiratory: Negative.   Cardiovascular: Negative.   Gastrointestinal: Positive for abdominal pain.  Endocrine: Negative.   Genitourinary: Negative.   Musculoskeletal: Positive for back  pain, gait problem and joint swelling.  Allergic/Immunologic: Negative.   Neurological: Positive for numbness. Negative for weakness and light-headedness.  Hematological: Negative.   Psychiatric/Behavioral: Negative.      Objective: Vital Signs: BP 102/70   Pulse (!) 103   Ht 5' 7"  (1.702 m)   Wt 220 lb (99.8 kg)   BMI 34.46 kg/m   Physical Exam  Constitutional: She is oriented to person, place, and time.  HENT:  Head: Normocephalic and atraumatic.  Eyes: EOM are normal. Pupils are equal, round, and reactive to light.  Neck: Normal range of motion. Neck supple.  Pulmonary/Chest: Effort normal and breath sounds normal.  Abdominal: Soft. Bowel sounds are normal.  Musculoskeletal: She exhibits no tenderness or deformity.  Neurological: She is alert and oriented to person, place, and time.  Skin: Skin is warm and dry.  Psychiatric: She has a normal mood and affect. Judgment and thought content normal.    Back Exam   Tenderness  The patient is experiencing tenderness in the sacroiliac.  Range of Motion  Extension: normal  Flexion: normal  Lateral Bend Right: normal  Lateral Bend Left: normal  Rotation Right: normal  Rotation Left: normal   Muscle Strength  Right Quadriceps:  5/5  Left Quadriceps:  5/5  Right Hamstrings:  5/5   Tests  Straight leg raise right: negative Straight leg raise  left: negative  Reflexes  Patellar: normal Achilles: normal Babinski's sign: normal   Other  Toe Walk: normal Heel Walk: normal Sensation: normal Gait: normal  Erythema: no back redness Scars: absent  Comments:  Positive left figure of 4, tender left SI joint.      Specialty Comments:  No specialty comments available.  Imaging: No results found.   PMFS History: Patient Active Problem List   Diagnosis Date Noted  . Left shoulder pain 06/02/2015  . Insomnia 05/12/2014  . Fibromyalgia 05/12/2014  . Allergic rhinitis 03/28/2014  . Osteoarthritis 12/20/2013    . Cervical strain 06/21/2013  . Low back pain 06/21/2013  . Metatarsalgia of both feet 03/11/2013  . Chest pain 01/25/2013  . Sciatica 01/25/2013  . Restless leg syndrome 11/24/2012  . Pure hypercholesterolemia 10/20/2012  . OSA on CPAP 10/20/2012  . Depression 10/20/2012  . Other and unspecified hyperlipidemia 10/01/2012  . Iron deficiency anemia, unspecified 10/01/2012  . Diabetes mellitus, type 2 ([place]) 12/31/2010  . ANAL FISSURE 12/31/2010  . Obesity 01/08/2007  . CONSTIPATION 01/08/2007   Past Medical History:  Diagnosis Date  . Allergic rhinitis, cause unspecified   . Arthritis   . Constipation   . CTS (carpal tunnel syndrome)   . Cystocele   . Depression   . Diabetes mellitus   . Dysfunction of eustachian tube   . Fatty liver   . Fissure, anal   . Genital herpes   . GERD (gastroesophageal reflux disease)   . Hemorrhoid   . Hx of migraine headaches   . Hyperlipidemia   . Hypertension   . Insomnia   . Iron deficiency anemia, unspecified   . Obesity   . OSA on CPAP   . Rectocele     Family History  Problem Relation Age of Onset  . Hypertension Mother 36  . Osteoarthritis Mother   . Diabetes Mother   . Colon cancer Mother 47  . Cancer Mother 26    colon cancer  . Colon cancer Paternal Grandmother   . Cancer Paternal Grandmother   . Stroke Maternal Grandmother   . Colon polyps Brother     x 2 brother  . Diabetes Daughter   . Hypertension Daughter   . Sleep apnea Daughter   . Mental illness Daughter     depression  . Migraines Daughter   . Colon polyps Brother   . Cirrhosis Brother     alcoholism  . Alcohol abuse Brother   . Migraines Daughter   . Mental illness Daughter     anxiety attacks  . Protein S deficiency Daughter   . Sleep apnea Brother   . Hyperlipidemia Brother   . Colon cancer Paternal Aunt     Past Surgical History:  Procedure Laterality Date  . ABDOMINAL HYSTERECTOMY  11/11/2010   Marvel Plan.  Ovaries intact.  Uterine fibroids  with DUB.  [name] CARPAL TUNNEL RELEASE  1989   Left  . CHOLECYSTECTOMY    . CHOLECYSTECTOMY    . COLONOSCOPY  06/29/2012   normal.  Repeat in 5 years.  [name] [place]  . ESOPHAGOGASTRODUODENOSCOPY  12/13/2011   dysphagia.  Henrene Pastor.  Normal.  . Sleep study  09/11/2012   severe OSA.  CPAP titration at 12 cm water pressure.  . TUBAL LIGATION  1988   Social History   Occupational History  . [employer]   Social History Main Topics  . Smoking status:  Former Smoker  . Smokeless tobacco: Never Used     Comment: smoked occasionally longest 6 mos.  . Alcohol use No  . Drug use: No  . Sexual activity: No

## 2016-10-30 ENCOUNTER — Telehealth: Payer: Self-pay | Admitting: Family Medicine

## 2016-10-30 ENCOUNTER — Encounter: Payer: Self-pay | Admitting: Family Medicine

## 2016-10-30 ENCOUNTER — Ambulatory Visit (INDEPENDENT_AMBULATORY_CARE_PROVIDER_SITE_OTHER): Payer: 59

## 2016-10-30 ENCOUNTER — Ambulatory Visit (INDEPENDENT_AMBULATORY_CARE_PROVIDER_SITE_OTHER): Payer: 59 | Admitting: Family Medicine

## 2016-10-30 VITALS — BP 118/64 | HR 99 | Temp 98.9°F | Resp 16 | Ht 67.0 in | Wt 227.0 lb

## 2016-10-30 DIAGNOSIS — E7800 Pure hypercholesterolemia, unspecified: Secondary | ICD-10-CM | POA: Diagnosis not present

## 2016-10-30 DIAGNOSIS — E1142 Type 2 diabetes mellitus with diabetic polyneuropathy: Secondary | ICD-10-CM | POA: Diagnosis not present

## 2016-10-30 DIAGNOSIS — S1096XA Insect bite of unspecified part of neck, initial encounter: Secondary | ICD-10-CM | POA: Diagnosis not present

## 2016-10-30 DIAGNOSIS — W57XXXA Bitten or stung by nonvenomous insect and other nonvenomous arthropods, initial encounter: Secondary | ICD-10-CM

## 2016-10-30 DIAGNOSIS — Z6835 Body mass index (BMI) 35.0-35.9, adult: Secondary | ICD-10-CM

## 2016-10-30 DIAGNOSIS — G4733 Obstructive sleep apnea (adult) (pediatric): Secondary | ICD-10-CM | POA: Diagnosis not present

## 2016-10-30 DIAGNOSIS — Z9989 Dependence on other enabling machines and devices: Secondary | ICD-10-CM | POA: Diagnosis not present

## 2016-10-30 DIAGNOSIS — M797 Fibromyalgia: Secondary | ICD-10-CM | POA: Diagnosis not present

## 2016-10-30 DIAGNOSIS — IMO0001 Reserved for inherently not codable concepts without codable children: Secondary | ICD-10-CM

## 2016-10-30 DIAGNOSIS — R0789 Other chest pain: Secondary | ICD-10-CM

## 2016-10-30 DIAGNOSIS — R079 Chest pain, unspecified: Secondary | ICD-10-CM | POA: Diagnosis not present

## 2016-10-30 DIAGNOSIS — E6609 Other obesity due to excess calories: Secondary | ICD-10-CM | POA: Diagnosis not present

## 2016-10-30 MED ORDER — HYDROCORTISONE 2.5 % EX OINT: TOPICAL_OINTMENT | Freq: Two times a day (BID) | CUTANEOUS | 0 refills | Status: DC

## 2016-10-30 MED ORDER — NITROGLYCERIN 0.4 MG SL SUBL: 0.4000 mg | SUBLINGUAL_TABLET | SUBLINGUAL | 0 refills | Status: DC | PRN

## 2016-10-30 NOTE — Telephone Encounter (Signed)
Called Cristina [name] to see how her chest pains was she stated that it comes and goes been like that for a few months she also has has some nausea no sweats to vomits pain in left arm she has had a EKG this morning Doctor said it was okay to follow up with her PCP her pulse at the time was 108 but its been running high for a while the highest has been 118 she's been fatigued for a few months also along with the off/oh chest pains that seem to happen on her lt side she states that its pain not pressure

## 2016-10-30 NOTE — Progress Notes (Signed)
Subjective:    Patient ID: Cristina [name], female    DOB: [birth year], 55 y.o.   MRN: [id number]  10/30/2016  Chest Pain (started a month ago off and on)   HPI This 55 y.o. female presents for evaluation of chest pain intermittent for one month.  Occurs sporadically; not always the same time of day.  Yesterday, occurred after work; sitting on couch; no dinner yet.  L sided chest pain; radiates to L arm intermittently.  No associated SOB; nauseated yesterday but seems separate from chest pain.  Can have nausea with chest pain.  No diaphoresis.  Sweats like crazy all the time. Severity 5/10.  No known triggers; duration 3-4 minutes.  No exertional component to chest pain.  Has started noticing that develops increase in heart rate with ambulation this week. Today went to bathroom and HR 115.  Highest rate depends on rapid pace of ambulation.  No indigestion or reflux.  No coughing or asthma.  Recent flooring repairs at work; has been more congested at work. No wheezing; has felt cannot catch breath due to glue.  Woke up one night from sleep.  One night unable to go to sleep due to chest pain.  No leg swelling; no calf pain.  No recent trips; no surgeries.    Having a lot of headaches; always hurts in in L shoulder.  Has fibromyalgia; hurts all the time in B shoulder; L> R shoulder.  Immunization History  Administered Date(s) Administered  . Hepatitis B 11/12/1999  . Influenza Split 11/11/2010, 08/14/2012, 08/25/2014  . Influenza-Unspecified 07/12/2016  . Pneumococcal Polysaccharide-23 11/12/2007, 05/04/2016  . Td 11/11/1998  . Tdap 02/22/2010   Wt Readings from Last 3 Encounters:  11/07/16 224 lb (101.6 kg)  10/30/16 227 lb (103 kg)  10/28/16 220 lb (99.8 kg)   BP Readings from Last 3 Encounters:  11/07/16 (!) 128/92  10/30/16 118/64  10/28/16 102/70    Review of Systems  Constitutional: Negative for chills, diaphoresis, fatigue and fever.  HENT: Positive for congestion.   Eyes:  Negative for visual disturbance.  Respiratory: Negative for cough and shortness of breath.   Cardiovascular: Positive for chest pain and palpitations. Negative for leg swelling.  Gastrointestinal: Positive for nausea. Negative for abdominal pain, constipation, diarrhea and vomiting.  Endocrine: Negative for cold intolerance, heat intolerance, polydipsia, polyphagia and polyuria.  Musculoskeletal: Positive for arthralgias, neck pain and neck stiffness.  Neurological: Positive for headaches. Negative for dizziness, tremors, seizures, syncope, facial asymmetry, speech difficulty, weakness, light-headedness and numbness.  Psychiatric/Behavioral: Positive for dysphoric mood. The patient is nervous/anxious.     Past Medical History:  Diagnosis Date  . Allergic rhinitis, cause unspecified   . Arthritis   . Constipation   . CTS (carpal tunnel syndrome)   . Cystocele   . Depression   . Diabetes mellitus   . Dysfunction of eustachian tube   . Fatty liver   . Fissure, anal   . Genital herpes   . GERD (gastroesophageal reflux disease)   . Hemorrhoid   . Hx of migraine headaches   . Hyperlipidemia   . Hypertension   . Insomnia   . Iron deficiency anemia, unspecified   . Obesity   . OSA on CPAP   . Rectocele    Past Surgical History:  Procedure Laterality Date  . ABDOMINAL HYSTERECTOMY  11/11/2010   [facility] Plan.  Ovaries intact.  Uterine fibroids with DUB.  [name] CARPAL TUNNEL RELEASE  1989   Left  . CHOLECYSTECTOMY    .  CHOLECYSTECTOMY    . COLONOSCOPY  06/29/2012   normal.  Repeat in 5 years.  [name] [place]  . ESOPHAGOGASTRODUODENOSCOPY  12/13/2011   dysphagia.  Henrene Pastor.  Normal.  . Sleep study  09/11/2012   severe OSA.  CPAP titration at 12 cm water pressure.  . TUBAL LIGATION  1988   Allergies  Allergen Reactions  . Fish Oil Rash   Current Outpatient Prescriptions  Medication Sig Dispense Refill  . acyclovir (ZOVIRAX) 400 MG tablet TAKE 1 TABLET BY MOUTH 2 TIMES  DAILY. 180 tablet 3  . albuterol (PROAIR HFA) 108 (90 Base) MCG/ACT inhaler Inhale 2 puffs into the lungs every 6 (six) hours as needed. 18 g 1  . ALPRAZolam (XANAX) 0.5 MG tablet TAKE 1 TABLET BY MOUTH ONCE DAILY AS NEEDED FOR ANXIETY 30 tablet 5  . AMBULATORY NON FORMULARY MEDICATION Reported on 10/26/2015    . ANUCORT-HC 25 MG suppository PLACE 1 SUPPOSITORY RECTALLY 2 TIMES DAILY AS NEEDED FOR HEMORRHOIDS. 25 suppository 5  . aspirin EC 81 MG tablet Take 81 mg by mouth daily.    [name] azelastine (ASTELIN) 0.1 % nasal spray PLACE 2 SPRAYS INTO BOTH NOSTRILS 2 TIMES DAILY AS DIRECTED 90 mL 3  . azelastine (OPTIVAR) 0.05 % ophthalmic solution   3  . BIOTIN 5000 PO Take by mouth.    [name] buPROPion (WELLBUTRIN SR) 100 MG 12 hr tablet Take 1 tablet (100 mg total) by mouth daily. 90 tablet 3  . Calcium Carbonate-Vitamin D (CALCIUM + D PO) Take 1 tablet by mouth daily.    . canagliflozin (INVOKANA) 300 MG TABS tablet Take 1 tablet (300 mg total) by mouth daily before breakfast. 90 tablet 3  . cetirizine (ZYRTEC) 10 MG tablet Take 10 mg by mouth daily.    [name] diltiazem 2 % GEL Apply 1 application topically 5 (five) times daily. 1 Package 3  . docusate sodium (COLACE) 100 MG capsule Take 100 mg by mouth daily. Reported on 05/04/2016    . ferrous sulfate 325 (65 FE) MG EC tablet Take 325 mg by mouth daily.     . fluticasone (FLONASE) 50 MCG/ACT nasal spray PLACE 2 SPRAYS INTO BOTH NOSTRILS DAILY. 48 g 3  . gabapentin (NEURONTIN) 100 MG capsule Take 100 mg by mouth 3 (three) times daily. 200mg  morning 100mg  lunch 200mg  bedtime    . glipiZIDE (GLIPIZIDE XL) 10 MG 24 hr tablet TAKE 1 TABLET BY MOUTH DAILY WITH BREAKFAST. 90 tablet 3  . Glucosamine HCl 1000 MG TABS Take 1,000 mg by mouth 2 (two) times daily with a meal.     . glucose blood (TRUE METRIX BLOOD GLUCOSE TEST) test strip 1 each by Other route 2 (two) times daily as needed for other. Use as instructed    . hydrocortisone 2.5 % ointment Apply topically 2  (two) times daily. 30 g 0  . Insulin Pen Needle (PEN NEEDLES) 31G X 5 MM MISC 1 each by Does not apply route daily. USE TO INJECT VICTOZA DAILY AS DIRECTED 100 each 3  . Liraglutide 18 MG/3ML SOPN Inject 0.2 mLs (1.2 mg total) into the skin daily. 6 mL 11  . lisinopril (PRINIVIL,ZESTRIL) 5 MG tablet Take 1 tablet (5 mg total) by mouth daily. 90 tablet 3  . metFORMIN (GLUCOPHAGE) 1000 MG tablet Take 1 tablet (1,000 mg total) by mouth 2 (two) times daily with a meal. 180 tablet 3  . montelukast (SINGULAIR) 10 MG tablet Take 1 tablet (10 mg  total) by mouth at bedtime. 90 tablet 3  . Multiple Vitamin (MULTIVITAMIN) capsule Take 1 capsule by mouth daily.    . pantoprazole (PROTONIX) 40 MG tablet TAKE 1 TABLET BY MOUTH 2 TIMES DAILY. 180 tablet 3  . phentermine (ADIPEX-P) 37.5 MG tablet Take 37.5 mg by mouth daily before breakfast.    . rOPINIRole (REQUIP) 1 MG tablet TAKE 1 TABLET BY MOUTH EVERY NIGHT AT BEDTIME. 90 tablet 3  . sertraline (ZOLOFT) 100 MG tablet TAKE 1 & 1/2 TABLET BY MOUTH DAILY 135 tablet 3  . simvastatin (ZOCOR) 40 MG tablet TAKE 1 TABLET BY MOUTH DAILY AT 6 PM. 90 tablet 3  . TRUEPLUS LANCETS 30G MISC USE TO CHECK BLOOD GLUCOSE DAILY AS DIRECTED 100 each 3  . zolpidem (AMBIEN) 5 MG tablet TAKE 1 TABLET BY MOUTH ONCE DAILY AT BEDTIME AS NEEDED 30 tablet 5  . nitroGLYCERIN (NITROSTAT) 0.4 MG SL tablet Place 1 tablet (0.4 mg total) under the tongue every 5 (five) minutes as needed for chest pain. 20 tablet 0   No current facility-administered medications for this visit.    Social History   Social History  . Marital status: Single    Spouse name: n/a  . Number of children: 3  . Years of education: college   Occupational History  . [employer]   Social History Main Topics  . Smoking status: Former Research scientist (life sciences)  . Smokeless tobacco: Never Used     Comment: smoked occasionally longest 6 mos.  . Alcohol use No  . Drug use: No  .  Sexual activity: No   Other Topics Concern  . Not on file   Social History Narrative      Marital status:  Divorced in 2000 after six years of marriage; not dating in 2017.      Children:  3 daughters (38, 34, 54); 6 grandchildren.      Lives: alone.  2 dogs; grandson stays some.      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      Tobacco: none      Alcohol: socially; special occasions.  Rare.      Drugs: none      Exercise:  Sporadic; stationary bike.      Sexual activity:  Not sexually active since 2011.       Guns:  No guns in the home.       Smoke detectors in use,       Seatbelt:  Sometimes uses seat belts. 75% of time.  No texting while driving.     Family History  Problem Relation Age of Onset  . Hypertension Mother 46  . Osteoarthritis Mother   . Diabetes Mother   . Colon cancer Mother 63  . Cancer Mother 63    colon cancer  . Colon cancer Paternal Grandmother   . Cancer Paternal Grandmother   . Stroke Maternal Grandmother   . Colon polyps Brother     x 2 brother  . Diabetes Daughter   . Hypertension Daughter   . Sleep apnea Daughter   . Mental illness Daughter     depression  . Migraines Daughter   . Colon polyps Brother   . Cirrhosis Brother     alcoholism  . Alcohol abuse Brother   . Migraines Daughter   . Mental illness Daughter     anxiety attacks  . Protein S deficiency Daughter   .  Sleep apnea Brother   . Hyperlipidemia Brother   . Colon cancer Paternal Aunt        Objective:    BP 118/64 (BP Location: Left Arm, Patient Position: Sitting, Cuff Size: Large)   Pulse 99   Temp 98.9 F (37.2 C) (Oral)   Resp 16   Ht 5\' 7"  (1.702 m)   Wt 227 lb (103 kg)   SpO2 96%   BMI 35.55 kg/m  Physical Exam  Constitutional: She is oriented to person, place, and time. She appears well-developed and well-nourished. No distress.  HENT:  Head: Normocephalic and atraumatic.  Right Ear: External ear normal.  Left Ear: External  ear normal.  Nose: Nose normal.  Mouth/Throat: Oropharynx is clear and moist.  Eyes: Conjunctivae and EOM are normal. Pupils are equal, round, and reactive to light.  Neck: Normal range of motion. Neck supple. Carotid bruit is not present. No thyromegaly present.  Cardiovascular: Normal rate, regular rhythm, normal heart sounds and intact distal pulses.  Exam reveals no gallop and no friction rub.   No murmur heard. Pulmonary/Chest: Effort normal and breath sounds normal. She has no wheezes. She has no rales.  Abdominal: Soft. Bowel sounds are normal. She exhibits no distension and no mass. There is no tenderness. There is no rebound and no guarding.  Musculoskeletal:       Right shoulder: Normal.       Left shoulder: Normal.       Cervical back: She exhibits pain. She exhibits normal range of motion, no tenderness and no bony tenderness.  Lymphadenopathy:    She has no cervical adenopathy.  Neurological: She is alert and oriented to person, place, and time. No cranial nerve deficit.  Skin: Skin is warm and dry. No rash noted. She is not diaphoretic. No erythema. No pallor.  Psychiatric: She has a normal mood and affect. Her behavior is normal.   Results for orders placed or performed in visit on 10/30/16  CBC with Differential/Platelet  Result Value Ref Range   WBC 16.2 (H) 3.4 - 10.8 x10E3/uL   RBC 5.14 3.77 - 5.28 x10E6/uL   Hemoglobin 14.0 11.1 - 15.9 g/dL   Hematocrit 42.7 34.0 - 46.6 %   MCV 83 79 - 97 fL   MCH 27.2 26.6 - 33.0 pg   MCHC 32.8 31.5 - 35.7 g/dL   RDW 16.2 (H) 12.3 - 15.4 %   Platelets 371 150 - 379 x10E3/uL   Neutrophils 56 Not Estab. %   Lymphs 37 Not Estab. %   Monocytes 5 Not Estab. %   Eos 2 Not Estab. %   Basos 0 Not Estab. %   Neutrophils Absolute 9.0 (H) 1.4 - 7.0 x10E3/uL   Lymphocytes Absolute 5.9 (H) 0.7 - 3.1 x10E3/uL   Monocytes Absolute 0.9 0.1 - 0.9 x10E3/uL   EOS (ABSOLUTE) 0.3 0.0 - 0.4 x10E3/uL   Basophils Absolute 0.0 0.0 - 0.2 x10E3/uL     Immature Granulocytes 0 Not Estab. %   Immature Grans (Abs) 0.0 0.0 - 0.1 x10E3/uL  Comprehensive metabolic panel  Result Value Ref Range   Glucose 181 (H) 65 - 99 mg/dL   BUN 15 6 - 24 mg/dL   Creatinine, Ser 0.81 0.57 - 1.00 mg/dL   GFR calc non Af Amer 83 >59 mL/min/1.73   GFR calc Af Amer 96 >59 mL/min/1.73   BUN/Creatinine Ratio 19 9 - 23   Sodium 140 134 - 144 mmol/L   Potassium 4.7 3.5 - 5.2  mmol/L   Chloride 99 96 - 106 mmol/L   CO2 24 18 - 29 mmol/L   Calcium 9.4 8.7 - 10.2 mg/dL   Total Protein 7.3 6.0 - 8.5 g/dL   Albumin 4.2 3.5 - 5.5 g/dL   Globulin, Total 3.1 1.5 - 4.5 g/dL   Albumin/Globulin Ratio 1.4 1.2 - 2.2   Bilirubin Total <0.2 0.0 - 1.2 mg/dL   Alkaline Phosphatase 92 39 - 117 IU/L   AST 23 0 - 40 IU/L   ALT 44 (H) 0 - 32 IU/L   EKG: NSR; no acute changes;     Assessment & Plan:   1. Other chest pain   2. Insect bite of neck with local reaction, initial encounter   3. OSA on CPAP   4. Type 2 diabetes mellitus with diabetic polyneuropathy, without long-term current use of insulin ([place])   5. Pure hypercholesterolemia   6. Class 2 obesity due to excess calories with serious comorbidity and body mass index (BMI) of 35.0 to 35.9 in adult   7. Fibromyalgia    -new onset atypical chest pain in past month; multiple cardiac risk factors thus warrants stress testing; refer to cardiology. -to ED for recurrence; rx for NTG provided. -no exertional component -obtain labs and CXR. -has fibromyalgia with multiple myalgias and joint issues; thus, may be related to recent cervical spine and shoulder issues; I cannot reproduce the chest pain with range of motion of cervical spine or shoulder exam today.  Orders Placed This Encounter  Procedures  . DG Chest 2 View    Standing Status:   Future    Number of Occurrences:   1    Standing Expiration Date:   10/30/2017    Order Specific Question:   Reason for Exam (SYMPTOM  OR DIAGNOSIS REQUIRED)    Answer:   L sided  chest pain    Order Specific Question:   Is the patient pregnant?    Answer:   No    Order Specific Question:   Preferred imaging location?    Answer:   External  . CBC with Differential/Platelet  . Comprehensive metabolic panel  . Ambulatory referral to Cardiology    Referral Priority:   Routine    Referral Type:   Consultation    Referral Reason:   Specialty Services Required    Requested Specialty:   Cardiology    Number of Visits Requested:   1  . EKG 12-Lead   Meds ordered this encounter  Medications  . cetirizine (ZYRTEC) 10 MG tablet    Sig: Take 10 mg by mouth daily.  . hydrocortisone 2.5 % ointment    Sig: Apply topically 2 (two) times daily.    Dispense:  30 g    Refill:  0  . nitroGLYCERIN (NITROSTAT) 0.4 MG SL tablet    Sig: Place 1 tablet (0.4 mg total) under the tongue every 5 (five) minutes as needed for chest pain.    Dispense:  20 tablet    Refill:  0    No Follow-up on file.   Cristina [name] Elayne Guerin, M.D. [facility] [address] [place], [place]  [address] [phone] phone [phone] fax

## 2016-10-30 NOTE — Patient Instructions (Signed)
     IF you received an x-ray today, you will receive an invoice from [facility]. Please contact [facility] at [phone] with questions or concerns regarding your invoice.   IF you received labwork today, you will receive an invoice from [facility]. Please contact [facility] at [phone] with questions or concerns regarding your invoice.   Our billing staff will not be able to assist you with questions regarding bills from these companies.  You will be contacted with the lab results as soon as they are available. The fastest way to get your results is to activate your My Chart account. Instructions are located on the last page of this paperwork. If you have not heard from us regarding the results in 2 weeks, please contact this office.     

## 2016-10-31 LAB — COMPREHENSIVE METABOLIC PANEL
A/G RATIO: 1.4 (ref 1.2–2.2)
ALT: 44 IU/L — AB (ref 0–32)
AST: 23 IU/L (ref 0–40)
Albumin: 4.2 g/dL (ref 3.5–5.5)
Alkaline Phosphatase: 92 IU/L (ref 39–117)
BUN/Creatinine Ratio: 19 (ref 9–23)
BUN: 15 mg/dL (ref 6–24)
CALCIUM: 9.4 mg/dL (ref 8.7–10.2)
CHLORIDE: 99 mmol/L (ref 96–106)
CO2: 24 mmol/L (ref 18–29)
Creatinine, Ser: 0.81 mg/dL (ref 0.57–1.00)
GFR calc non Af Amer: 83 mL/min/{1.73_m2} (ref >59)
GFR, EST AFRICAN AMERICAN: 96 mL/min/{1.73_m2} (ref >59)
GLUCOSE: 181 mg/dL — AB (ref 65–99)
Globulin, Total: 3.1 g/dL (ref 1.5–4.5)
POTASSIUM: 4.7 mmol/L (ref 3.5–5.2)
Sodium: 140 mmol/L (ref 134–144)
TOTAL PROTEIN: 7.3 g/dL (ref 6.0–8.5)

## 2016-10-31 LAB — CBC WITH DIFFERENTIAL/PLATELET
BASOS ABS: 0 10*3/uL (ref 0.0–0.2)
Basos: 0 %
EOS (ABSOLUTE): 0.3 10*3/uL (ref 0.0–0.4)
Eos: 2 %
Hematocrit: 42.7 % (ref 34.0–46.6)
Hemoglobin: 14 g/dL (ref 11.1–15.9)
IMMATURE GRANS (ABS): 0 10*3/uL (ref 0.0–0.1)
IMMATURE GRANULOCYTES: 0 %
LYMPHS: 37 %
Lymphocytes Absolute: 5.9 10*3/uL — ABNORMAL HIGH (ref 0.7–3.1)
MCH: 27.2 pg (ref 26.6–33.0)
MCHC: 32.8 g/dL (ref 31.5–35.7)
MCV: 83 fL (ref 79–97)
MONOS ABS: 0.9 10*3/uL (ref 0.1–0.9)
Monocytes: 5 %
NEUTROS PCT: 56 %
Neutrophils Absolute: 9 10*3/uL — ABNORMAL HIGH (ref 1.4–7.0)
PLATELETS: 371 10*3/uL (ref 150–379)
RBC: 5.14 x10E6/uL (ref 3.77–5.28)
RDW: 16.2 % — AB (ref 12.3–15.4)
WBC: 16.2 10*3/uL — ABNORMAL HIGH (ref 3.4–10.8)

## 2016-10-31 MED FILL — NITROGLYCERIN 0.4 MG TAB SL: 0.4 | 13 days supply | Qty: 25 | Fill #0

## 2016-10-31 MED FILL — HYDROCORTISONE 2.5% OINT: 2.5 | 15 days supply | Qty: 28 | Fill #0

## 2016-11-07 ENCOUNTER — Encounter: Payer: Self-pay | Admitting: Internal Medicine

## 2016-11-07 ENCOUNTER — Ambulatory Visit (INDEPENDENT_AMBULATORY_CARE_PROVIDER_SITE_OTHER): Payer: 59 | Admitting: Internal Medicine

## 2016-11-07 VITALS — BP 128/92 | HR 90 | Ht 64.0 in | Wt 224.0 lb

## 2016-11-07 DIAGNOSIS — R079 Chest pain, unspecified: Secondary | ICD-10-CM | POA: Diagnosis not present

## 2016-11-07 DIAGNOSIS — R0602 Shortness of breath: Secondary | ICD-10-CM | POA: Diagnosis not present

## 2016-11-07 LAB — CBC WITH DIFFERENTIAL/PLATELET
BASOS PCT: 0 %
Basophils Absolute: 0 cells/uL (ref 0–200)
EOS ABS: 141 {cells}/uL (ref 15–500)
Eosinophils Relative: 1 %
HCT: 43 % (ref 35.0–45.0)
Hemoglobin: 13.9 g/dL (ref 11.7–15.5)
LYMPHS PCT: 38 %
Lymphs Abs: 5358 cells/uL — ABNORMAL HIGH (ref 850–3900)
MCH: 27.5 pg (ref 27.0–33.0)
MCHC: 32.3 g/dL (ref 32.0–36.0)
MCV: 85 fL (ref 80.0–100.0)
MONOS PCT: 6 %
MPV: 9.8 fL (ref 7.5–12.5)
Monocytes Absolute: 846 cells/uL (ref 200–950)
NEUTROS ABS: 7755 {cells}/uL (ref 1500–7800)
Neutrophils Relative %: 55 %
PLATELETS: 343 10*3/uL (ref 140–400)
RBC: 5.06 MIL/uL (ref 3.80–5.10)
RDW: 15.9 % — AB (ref 11.0–15.0)
WBC: 14.1 10*3/uL — AB (ref 3.8–10.8)

## 2016-11-07 LAB — TROPONIN I: Troponin I: <0.01 ng/mL (ref <=0.05)

## 2016-11-07 LAB — D-DIMER, QUANTITATIVE (NOT AT ARMC): D DIMER QUANT: 0.49 ug{FEU}/mL (ref <0.50)

## 2016-11-07 MED FILL — INVOKANA 300 MG TABLET: 300 | 90 days supply | Qty: 90 | Fill #2

## 2016-11-07 MED FILL — SERTRALINE HCL 100 MG TAB: 100 | 90 days supply | Qty: 135 | Fill #2

## 2016-11-07 MED FILL — LEVOCETIRIZINE 5 MG TABLET: 5 | 30 days supply | Qty: 30 | Fill #1

## 2016-11-07 MED FILL — metFORMIN HCL 1000 MG TABS: 1000 | 90 days supply | Qty: 180 | Fill #2

## 2016-11-07 MED FILL — rOPINIRole HCL 1 MG TABS: 1 | 90 days supply | Qty: 90 | Fill #2

## 2016-11-07 MED FILL — MONTELUKAST SOD 10 MG TAB: 10 | 90 days supply | Qty: 90 | Fill #2

## 2016-11-07 MED FILL — LISINOPRIL 5 MG TABLET: 5 | 90 days supply | Qty: 90 | Fill #2

## 2016-11-07 NOTE — Progress Notes (Signed)
HPI Cristina [name] presents today for an unscheduled visit to evaluate chest pain. She is a pleasant middle aged woman with a long h/o DM, HTN and obesity. She notes a remote period of chest pain years ago. She redeveloped episodic chest pain, non-exertional about a month ago. She typically has some daily, which is not associated with sob. She has occaisional episodes of neck pain, described as a point tenderness over the base of her left neck. No radiation to the arms. No dyspnea. No edema. She has some cramping in her legs. She notes some nausea which is not associated with her pain. No syncope. Allergies  Allergen Reactions  . Fish Oil Rash     Current Outpatient Prescriptions  Medication Sig Dispense Refill  . acyclovir (ZOVIRAX) 400 MG tablet TAKE 1 TABLET BY MOUTH 2 TIMES DAILY. 180 tablet 3  . albuterol (PROAIR HFA) 108 (90 Base) MCG/ACT inhaler Inhale 2 puffs into the lungs every 6 (six) hours as needed. 18 g 1  . ALPRAZolam (XANAX) 0.5 MG tablet TAKE 1 TABLET BY MOUTH ONCE DAILY AS NEEDED FOR ANXIETY 30 tablet 5  . AMBULATORY NON FORMULARY MEDICATION Reported on 10/26/2015    . ANUCORT-HC 25 MG suppository PLACE 1 SUPPOSITORY RECTALLY 2 TIMES DAILY AS NEEDED FOR HEMORRHOIDS. 25 suppository 5  . aspirin EC 81 MG tablet Take 81 mg by mouth daily.    [name] azelastine (ASTELIN) 0.1 % nasal spray PLACE 2 SPRAYS INTO BOTH NOSTRILS 2 TIMES DAILY AS DIRECTED 90 mL 3  . azelastine (OPTIVAR) 0.05 % ophthalmic solution   3  . BIOTIN 5000 PO Take by mouth.    [name] buPROPion (WELLBUTRIN SR) 100 MG 12 hr tablet Take 1 tablet (100 mg total) by mouth daily. 90 tablet 3  . Calcium Carbonate-Vitamin D (CALCIUM + D PO) Take 1 tablet by mouth daily.    . canagliflozin (INVOKANA) 300 MG TABS tablet Take 1 tablet (300 mg total) by mouth daily before breakfast. 90 tablet 3  . cetirizine (ZYRTEC) 10 MG tablet Take 10 mg by mouth daily.    [name] diltiazem 2 % GEL Apply 1 application topically 5 (five) times daily.  1 Package 3  . docusate sodium (COLACE) 100 MG capsule Take 100 mg by mouth daily. Reported on 05/04/2016    . ferrous sulfate 325 (65 FE) MG EC tablet Take 325 mg by mouth daily.     . fluticasone (FLONASE) 50 MCG/ACT nasal spray PLACE 2 SPRAYS INTO BOTH NOSTRILS DAILY. 48 g 3  . gabapentin (NEURONTIN) 100 MG capsule Take 100 mg by mouth 3 (three) times daily. 2105m morning 1055mlunch 20048medtime    . glipiZIDE (GLIPIZIDE XL) 10 MG 24 hr tablet TAKE 1 TABLET BY MOUTH DAILY WITH BREAKFAST. 90 tablet 3  . Glucosamine HCl 1000 MG TABS Take 1,000 mg by mouth 2 (two) times daily with a meal.     . glucose blood (TRUE METRIX BLOOD GLUCOSE TEST) test strip 1 each by Other route 2 (two) times daily as needed for other. Use as instructed    . hydrocortisone 2.5 % ointment Apply topically 2 (two) times daily. 30 g 0  . Insulin Pen Needle (PEN NEEDLES) 31G X 5 MM MISC 1 each by Does not apply route daily. USE TO INJECT VICTOZA DAILY AS DIRECTED 100 each 3  . Liraglutide 18 MG/3ML SOPN Inject 0.2 mLs (1.2 mg total) into the skin daily. 6 mL 11  . lisinopril (PRINIVIL,ZESTRIL) 5  MG tablet Take 1 tablet (5 mg total) by mouth daily. 90 tablet 3  . metFORMIN (GLUCOPHAGE) 1000 MG tablet Take 1 tablet (1,000 mg total) by mouth 2 (two) times daily with a meal. 180 tablet 3  . montelukast (SINGULAIR) 10 MG tablet Take 1 tablet (10 mg total) by mouth at bedtime. 90 tablet 3  . Multiple Vitamin (MULTIVITAMIN) capsule Take 1 capsule by mouth daily.    . nitroGLYCERIN (NITROSTAT) 0.4 MG SL tablet Place 1 tablet (0.4 mg total) under the tongue every 5 (five) minutes as needed for chest pain. 20 tablet 0  . pantoprazole (PROTONIX) 40 MG tablet TAKE 1 TABLET BY MOUTH 2 TIMES DAILY. 180 tablet 3  . phentermine (ADIPEX-P) 37.5 MG tablet Take 37.5 mg by mouth daily before breakfast.    . rOPINIRole (REQUIP) 1 MG tablet TAKE 1 TABLET BY MOUTH EVERY NIGHT AT BEDTIME. 90 tablet 3  . sertraline (ZOLOFT) 100 MG tablet TAKE 1 &  1/2 TABLET BY MOUTH DAILY 135 tablet 3  . simvastatin (ZOCOR) 40 MG tablet TAKE 1 TABLET BY MOUTH DAILY AT 6 PM. 90 tablet 3  . TRUEPLUS LANCETS 30G MISC USE TO CHECK BLOOD GLUCOSE DAILY AS DIRECTED 100 each 3  . zolpidem (AMBIEN) 5 MG tablet TAKE 1 TABLET BY MOUTH ONCE DAILY AT BEDTIME AS NEEDED 30 tablet 5   No current facility-administered medications for this visit.      Past Medical History:  Diagnosis Date  . Allergic rhinitis, cause unspecified   . Arthritis   . Constipation   . CTS (carpal tunnel syndrome)   . Cystocele   . Depression   . Diabetes mellitus   . Dysfunction of eustachian tube   . Fatty liver   . Fissure, anal   . Genital herpes   . GERD (gastroesophageal reflux disease)   . Hemorrhoid   . Hx of migraine headaches   . Hyperlipidemia   . Hypertension   . Insomnia   . Iron deficiency anemia, unspecified   . Obesity   . OSA on CPAP   . Rectocele     ROS:   All systems reviewed and negative except as noted in the HPI.   Past Surgical History:  Procedure Laterality Date  . ABDOMINAL HYSTERECTOMY  11/11/2010   [facility] Plan.  Ovaries intact.  Uterine fibroids with DUB.  [name] CARPAL TUNNEL RELEASE  1989   Left  . CHOLECYSTECTOMY    . CHOLECYSTECTOMY    . COLONOSCOPY  06/29/2012   normal.  Repeat in 5 years.  [name] [place]  . ESOPHAGOGASTRODUODENOSCOPY  12/13/2011   dysphagia.  Henrene Pastor.  Normal.  . Sleep study  09/11/2012   severe OSA.  CPAP titration at 12 cm water pressure.  . TUBAL LIGATION  1988     Family History  Problem Relation Age of Onset  . Hypertension Mother 32  . Osteoarthritis Mother   . Diabetes Mother   . Colon cancer Mother 24  . Cancer Mother 75    colon cancer  . Colon cancer Paternal Grandmother   . Cancer Paternal Grandmother   . Stroke Maternal Grandmother   . Colon polyps Brother     x 2 brother  . Diabetes Daughter   . Hypertension Daughter   . Sleep apnea Daughter   . Mental illness Daughter      depression  . Migraines Daughter   . Colon polyps Brother   . Cirrhosis Brother     alcoholism  .  Alcohol abuse Brother   . Migraines Daughter   . Mental illness Daughter     anxiety attacks  . Protein S deficiency Daughter   . Sleep apnea Brother   . Hyperlipidemia Brother   . Colon cancer Paternal Aunt      Social History   Social History  . Marital status: Single    Spouse name: n/a  . Number of children: 3  . Years of education: college   Occupational History  . [employer]   Social History Main Topics  . Smoking status: Former Research scientist (life sciences)  . Smokeless tobacco: Never Used     Comment: smoked occasionally longest 6 mos.  . Alcohol use No  . Drug use: No  . Sexual activity: No   Other Topics Concern  . Not on file   Social History Narrative      Marital status:  Divorced in 2000 after six years of marriage; not dating in 2017.      Children:  3 daughters (38, 60, 8); 6 grandchildren.      Lives: alone.  2 dogs; grandson stays some.      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      Tobacco: none      Alcohol: socially; special occasions.  Rare.      Drugs: none      Exercise:  Sporadic; stationary bike.      Sexual activity:  Not sexually active since 2011.       Guns:  No guns in the home.       Smoke detectors in use,       Seatbelt:  Sometimes uses seat belts. 75% of time.  No texting while driving.       BP (!) 128/92   Pulse 90   Ht 5' 4"  (1.626 m)   Wt 224 lb (101.6 kg)   BMI 38.45 kg/m   Physical Exam:  Well appearing obese, middle aged woman, NAD HEENT: Unremarkable Neck:  6 cm JVD, no thyromegally Lymphatics:  No adenopathy Back:  No CVA tenderness Lungs:  Clear with no wheezes HEART:  Regular rate rhythm, no murmurs, no rubs, no clicks Abd:  soft, obese, positive bowel sounds, no organomegally, no rebound, no guarding Ext:  2 plus pulses, no edema, no  cyanosis, no clubbing Skin:  No rashes no nodules Neuro:  CN II through XII intact, motor grossly intact  EKG - nsr with right axis deviation.  DEVICE  Normal device function.  See PaceArt for details.   Assess/Plan: 1. Chest pain - her symptoms sound non-cardiac but she has multiple cardiac risk factors. She has right axis deviation on her ECG. I have conferred with Dr. Acie Fredrickson who is pending a visit and will plan to obtain a lexiscan myoview and a 2D echo.  2. Dyslipidemia - she will continue statin therapy. 3. HTN - her blood pressure is reasonably well controlled. Weight loss would be helpful.  Mikle Bosworth.D.

## 2016-11-07 NOTE — Patient Instructions (Addendum)
Medication Instructions:  Your physician recommends that you continue on your current medications as directed. Please refer to the Current Medication list given to you today.   Labwork: TODAY - troponin, D-dimer, tsh, CBC w/diff   Testing/Procedures: Your physician has requested that you have an echocardiogram. Echocardiography is a painless test that uses sound waves to create images of your heart. It provides your doctor with information about the size and shape of your heart and how well your heart's chambers and valves are working. This procedure takes approximately one hour. There are no restrictions for this procedure.  Your physician has requested that you have a lexiscan myoview. For further information please visit [email or web address]. Please follow instruction sheet, as given.    Follow-Up: Your physician recommends that you schedule a follow-up appointment in: with Dr. Acie Fredrickson in 3-4 weeks   If you need a refill on your cardiac medications before your next appointment, please call your pharmacy.   Thank you for choosing [facility]! Christen Bame, RN [phone]

## 2016-11-08 ENCOUNTER — Ambulatory Visit (HOSPITAL_COMMUNITY): Payer: 59 | Attending: Cardiovascular Disease

## 2016-11-08 ENCOUNTER — Other Ambulatory Visit: Payer: Self-pay

## 2016-11-08 DIAGNOSIS — Z87891 Personal history of nicotine dependence: Secondary | ICD-10-CM | POA: Insufficient documentation

## 2016-11-08 DIAGNOSIS — R0602 Shortness of breath: Secondary | ICD-10-CM | POA: Diagnosis not present

## 2016-11-08 DIAGNOSIS — G4733 Obstructive sleep apnea (adult) (pediatric): Secondary | ICD-10-CM | POA: Insufficient documentation

## 2016-11-08 DIAGNOSIS — R079 Chest pain, unspecified: Secondary | ICD-10-CM | POA: Diagnosis not present

## 2016-11-08 DIAGNOSIS — E785 Hyperlipidemia, unspecified: Secondary | ICD-10-CM | POA: Diagnosis not present

## 2016-11-08 DIAGNOSIS — E119 Type 2 diabetes mellitus without complications: Secondary | ICD-10-CM | POA: Diagnosis not present

## 2016-11-08 LAB — TSH: TSH: 1.33 mIU/L

## 2016-11-08 MED ORDER — PERFLUTREN LIPID MICROSPHERE
1.0000 mL | INTRAVENOUS | Status: AC | PRN
  Administered 2016-11-08: 3 mL via INTRAVENOUS

## 2016-11-12 ENCOUNTER — Ambulatory Visit (HOSPITAL_COMMUNITY): Payer: 59 | Attending: Cardiovascular Disease

## 2016-11-12 ENCOUNTER — Ambulatory Visit: Payer: 59 | Attending: Specialist

## 2016-11-12 DIAGNOSIS — M62830 Muscle spasm of back: Secondary | ICD-10-CM | POA: Diagnosis not present

## 2016-11-12 DIAGNOSIS — E119 Type 2 diabetes mellitus without complications: Secondary | ICD-10-CM | POA: Insufficient documentation

## 2016-11-12 DIAGNOSIS — M6281 Muscle weakness (generalized): Secondary | ICD-10-CM | POA: Diagnosis not present

## 2016-11-12 DIAGNOSIS — R079 Chest pain, unspecified: Secondary | ICD-10-CM | POA: Insufficient documentation

## 2016-11-12 DIAGNOSIS — R293 Abnormal posture: Secondary | ICD-10-CM | POA: Insufficient documentation

## 2016-11-12 DIAGNOSIS — M545 Low back pain: Secondary | ICD-10-CM | POA: Insufficient documentation

## 2016-11-12 DIAGNOSIS — G8929 Other chronic pain: Secondary | ICD-10-CM | POA: Insufficient documentation

## 2016-11-12 DIAGNOSIS — R0602 Shortness of breath: Secondary | ICD-10-CM

## 2016-11-12 MED ORDER — REGADENOSON 0.4 MG/5ML IV SOLN
0.4000 mg | Freq: Once | INTRAVENOUS | Status: AC
  Administered 2016-11-12: 0.4 mg via INTRAVENOUS

## 2016-11-12 MED ORDER — TECHNETIUM TC 99M TETROFOSMIN IV KIT
33.0000 | PACK | Freq: Once | INTRAVENOUS | Status: AC | PRN
  Administered 2016-11-12: 33 via INTRAVENOUS
  Filled 2016-11-12: qty 33

## 2016-11-12 MED ORDER — AMINOPHYLLINE 25 MG/ML IV SOLN
75.0000 mg | Freq: Once | INTRAVENOUS | Status: AC
  Administered 2016-11-12: 75 mg via INTRAVENOUS

## 2016-11-12 NOTE — Patient Instructions (Signed)
Issued from the cabinet Williams flexion posterior pelvic tilts 10-30 reps  Per day and single knee to chest 30 sec x 3 RT and LT 3x/day

## 2016-11-12 NOTE — Therapy (Signed)
[place], [place], [address] Phone: [phone]   Fax:  [phone]  Physical Therapy Evaluation  Patient Details  Name: Cristina [name] MRN: [id number] Date of Birth: 18-Apr-1961 Referring Provider: Basil [name]. MD  Encounter Date: 11/12/2016      PT End of Session - 11/12/16 1714    Visit Number 1   Number of Visits 12   Date for PT Re-Evaluation 12/20/16   Authorization Type Cristina [name] [employer]   PT Start Time [id number]  late 6 min   PT Stop Time 0515   PT Time Calculation (min) 37 min   Activity Tolerance Patient tolerated treatment well;No increased pain   Behavior During Therapy WFL for tasks assessed/performed      Past Medical History:  Diagnosis Date  . Allergic rhinitis, cause unspecified   . Arthritis   . Constipation   . CTS (carpal tunnel syndrome)   . Cystocele   . Depression   . Diabetes mellitus   . Dysfunction of eustachian tube   . Fatty liver   . Fissure, anal   . Genital herpes   . GERD (gastroesophageal reflux disease)   . Hemorrhoid   . Hx of migraine headaches   . Hyperlipidemia   . Hypertension   . Insomnia   . Iron deficiency anemia, unspecified   . Obesity   . OSA on CPAP   . Rectocele     Past Surgical History:  Procedure Laterality Date  . ABDOMINAL HYSTERECTOMY  11/11/2010   Cristina [name].  Ovaries intact.  Uterine fibroids with DUB.  [name] CARPAL TUNNEL RELEASE  1989   Left  . CHOLECYSTECTOMY    . CHOLECYSTECTOMY    . COLONOSCOPY  06/29/2012   normal.  Repeat in 5 years.  [name] [place]  . ESOPHAGOGASTRODUODENOSCOPY  12/13/2011   dysphagia.  Cristina [name].  Normal.  . Sleep study  09/11/2012   severe OSA.  CPAP titration at 12 cm water pressure.  . TUBAL LIGATION  1988    There were no vitals filed for this visit.       Subjective Assessment - 11/12/16 1639    Subjective She reports chronic back pain for a long time (2 months ago )  . PAin comes and  goes.   MD said she has bulging disc and sciatic nerve .   She may have a nerve ablation in future   Pertinent History cortisone injections in lower back with minmal benefit   Limitations Walking;Sitting;Standing   How long can you sit comfortably? 30 min    How long can you stand comfortably? 5 min   How long can you walk comfortably? 5 min   Diagnostic tests MRI ; bulging disc   Patient Stated Goals Reduce pain   Currently in Pain? Yes   Pain Score 4    Pain Location Back   Pain Orientation Left   Pain Descriptors / Indicators --  just hurts   Pain Type Chronic pain   Pain Radiating Towards LT leg posterior thigh and at times anterior thigh   Pain Onset More than a month ago   Aggravating Factors  see above   Pain Relieving Factors tylenol , heat   Multiple Pain Sites No            [facility] PT Assessment - 11/12/16 0001      Assessment   Medical Diagnosis chronic back pain   Referring Provider Cristina [name]. MD  Onset Date/Surgical Date --  2 months ago with on and off pain over years   Next MD Visit As needed , See Dr Cristina [name] next week   Prior Therapy Not for back     Precautions   Precaution Comments Lifting limits of 10 pounds, limit bend/stoop/standor sit for long periods     Restrictions   Weight Bearing Restrictions No     Balance Screen   Has the patient fallen in the past 6 months No   Has the patient had a decrease in activity level because of a fear of falling?  No   Is the patient reluctant to leave their home because of a fear of falling?  No     Prior Function   Level of Independence Independent   Vocation Requirements Sit most of day     Cognition   Overall Cognitive Status Within Functional Limits for tasks assessed     ROM / Strength   AROM / PROM / Strength AROM;Strength     AROM   AROM Assessment Site Lumbar   Lumbar Flexion 45   Lumbar Extension 25   Lumbar - Right Side Bend 18   Lumbar - Left Side Bend 18     Strength   Overall  Strength Comments WNL in LE     Flexibility   Soft Tissue Assessment /Muscle Length yes   Hamstrings 65-70 degrees bilaterally      Palpation   Palpation comment Tender LT paraspinasl and lateral Lt hip and ITband     Ambulation/Gait   Gait Comments WNL                           PT Education - 11/12/16 1714    Education provided Yes   Education Details POC , HEP   Person(s) Educated Patient   Methods Explanation;Demonstration;Tactile cues;Verbal cues;Handout   Comprehension Returned demonstration;Verbalized understanding          PT Short Term Goals - 11/12/16 1719      PT SHORT TERM GOAL #1   Title She will be independent with intial HEP   Time 3   Period Weeks   Status New     PT SHORT TERM GOAL #2   Title she will report decr pain os able to stand 10 min or more before inc pain   Time 3   Period Weeks   Status New     PT SHORT TERM GOAL #3   Title she will report pain decr so she is able to walk 10 min or more before incr pain.    Time 3   Period Weeks   Status New           PT Long Term Goals - 11/12/16 1721      PT LONG TERM GOAL #1   Title she will be independent with all hEP issued    Time 6   Period Weeks   Status New     PT LONG TERM GOAL #2   Title She willbe able to walk for > 20 min due to decr pain   Time 6   Period Weeks   Status New     PT LONG TERM GOAL #3   Title she will report pain as intermittant   Time 6   Period Weeks   Status New     PT LONG TERM GOAL #4   Title She will be able to sit for  45 min before incr pain.    Time 6   Period Weeks   Status New               [name] - 11/12/16 1715    Clinical Impression Statement Cristina [name] presents for low complexity eval for chronic LBP with pain into LT thigh. She deo stiffness in spine , decreased lordosis, decr core strength, obesity, tenderness in paraspnals and lateral LT hip and thigh.  She is more painful when in extended postures and bette  flexed but incr pain with sistained flexion   PT Frequency 2x / week   PT Duration 6 weeks   PT Treatment/Interventions Iontophoresis 4mg /ml Dexamethasone;Moist Heat;Passive range of motion;Patient/family education;Manual techniques;Taping;Therapeutic exercise;Therapeutic activities;Dry needling   PT Next Visit [name] Add to flexion /stabilization exercise  with HEP , modalities as needed , ionto lateral LT hip, STW to lateral LT thigh and lower back   PT Home Exercise [name] PPT and single knee to chest   Consulted and Agree with [name] of Care Patient      Patient will benefit from skilled therapeutic intervention in order to improve the following deficits and impairments:  Postural dysfunction, Pain, Decreased strength, Decreased activity tolerance, Increased muscle spasms, Decreased range of motion, Obesity  Visit Diagnosis: Chronic left-sided low back pain, with sciatica presence unspecified - [name]: PT [name] of care cert/re-cert  Abnormal posture - [name]: PT [name] of care cert/re-cert  Muscle spasm of back - [name]: PT [name] of care cert/re-cert  Weakness of trunk musculature - [name]: PT [name] of care cert/re-cert     Problem List Patient Active Problem List   Diagnosis Date Noted  . Left shoulder pain 06/02/2015  . Insomnia 05/12/2014  . Fibromyalgia 05/12/2014  . Allergic rhinitis 03/28/2014  . Osteoarthritis 12/20/2013  . Cervical strain 06/21/2013  . Low back pain 06/21/2013  . Metatarsalgia of both feet 03/11/2013  . Chest pain 01/25/2013  . Sciatica 01/25/2013  . Restless leg syndrome 11/24/2012  . Pure hypercholesterolemia 10/20/2012  . OSA on CPAP 10/20/2012  . Depression 10/20/2012  . Iron deficiency anemia, unspecified 10/01/2012  . Diabetes mellitus, type 2 ([place]) 12/31/2010  . ANAL FISSURE 12/31/2010  . Obesity 01/08/2007  . CONSTIPATION 01/08/2007    Darrel Hoover  PT 11/12/2016, 5:25 PM  [facility] [address] [place], [place], [address] Phone: [phone]   Fax:  [phone]  Name: Cristina [name] MRN: [id number] Date of Birth: 08-07-1961

## 2016-11-13 ENCOUNTER — Ambulatory Visit (HOSPITAL_COMMUNITY): Payer: 59

## 2016-11-13 LAB — MYOCARDIAL PERFUSION IMAGING
CHL CUP NUCLEAR SDS: 6
CSEPPHR: 111 {beats}/min
LHR: 0.33
LV sys vol: 22 mL
LVDIAVOL: 71 mL (ref 46–106)
NUC STRESS TID: 1.13
Rest HR: 85 {beats}/min
SRS: 7
SSS: 13

## 2016-11-13 MED ORDER — TECHNETIUM TC 99M TETROFOSMIN IV KIT
32.5000 | PACK | Freq: Once | INTRAVENOUS | Status: DC | PRN
  Filled 2016-11-13: qty 33

## 2016-11-14 ENCOUNTER — Ambulatory Visit: Payer: 59 | Admitting: Neurology

## 2016-11-19 ENCOUNTER — Ambulatory Visit (INDEPENDENT_AMBULATORY_CARE_PROVIDER_SITE_OTHER): Payer: 59 | Admitting: Physical Medicine and Rehabilitation

## 2016-11-19 ENCOUNTER — Encounter (INDEPENDENT_AMBULATORY_CARE_PROVIDER_SITE_OTHER): Payer: Self-pay | Admitting: Physical Medicine and Rehabilitation

## 2016-11-19 ENCOUNTER — Telehealth (INDEPENDENT_AMBULATORY_CARE_PROVIDER_SITE_OTHER): Payer: Self-pay | Admitting: Physical Medicine and Rehabilitation

## 2016-11-19 ENCOUNTER — Telehealth: Payer: Self-pay | Admitting: Physical Therapy

## 2016-11-19 ENCOUNTER — Ambulatory Visit: Payer: 59 | Admitting: Physical Therapy

## 2016-11-19 VITALS — BP 112/68

## 2016-11-19 DIAGNOSIS — M792 Neuralgia and neuritis, unspecified: Secondary | ICD-10-CM

## 2016-11-19 DIAGNOSIS — M545 Low back pain, unspecified: Secondary | ICD-10-CM

## 2016-11-19 DIAGNOSIS — G8929 Other chronic pain: Secondary | ICD-10-CM

## 2016-11-19 DIAGNOSIS — M9904 Segmental and somatic dysfunction of sacral region: Secondary | ICD-10-CM | POA: Diagnosis not present

## 2016-11-19 NOTE — Progress Notes (Addendum)
Cristina [name] - 56 y.o. female MRN [id number]  Date of birth: [birth year]  Office Visit Note: Visit Date: 11/19/2016 PCP: Reginia Forts, MD Referred by: [name], MD  Subjective: Chief Complaint  Patient presents with  . 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 She has had chronic worsening severe left-sided low back and buttock pain for some time. She is a patient of Dr. Louanne Skye is and I would point due to his notes for further details and evaluation. Basically from my standpoint we have seen her for interventional injections of the lumbar spine which were really ineffectual. Some helped but didn't last very long. Most recently she had a sacroiliac joint injection on the left with fluoroscopic guidance. States she had 3 days of relief with last Si joint injection and then pain came right back. The relief she obtained was close to 100% relief. Constant pain and states it is typically always a pain level of 7 to 8. Worse with standing long periods. X-ray evidence of the sacrum and sacroiliac joints did not show any erosions. This is fairly common noted and with painful SI joints. Sacroiliac joint dysfunction is more common in females. Her lumbar spine MRI does show left-sided facet arthropathy at L4-5 with some foraminal lateral recess stenosis at that region. Injections here they were not very diagnostic. She denies any tingling numbness down the legs. She denies any focal weakness or trauma. She's had medications as well as physical therapy.    Review of Systems  Constitutional: Negative for chills, fever, malaise/fatigue and weight loss.  HENT: Negative for hearing loss and sinus pain.   Eyes: Negative for blurred vision, double vision and photophobia.  Respiratory: Negative for cough and shortness of breath.   Cardiovascular: Negative for chest pain, palpitations and leg swelling.  Gastrointestinal: Negative for abdominal pain, nausea and vomiting.   Genitourinary: Negative for flank pain.  Musculoskeletal: Positive for back pain and joint pain. Negative for myalgias.  Skin: Negative for itching and rash.  Neurological: Negative for tremors, focal weakness and weakness.  Endo/Heme/Allergies: Negative.   Psychiatric/Behavioral: Negative for depression.  All other systems reviewed and are negative.  Otherwise per HPI.  Assessment & Plan: Visit Diagnoses:  1. Neuritis   2. Segmental and somatic dysfunction of sacral region   3. Chronic left-sided low back pain without sciatica     Plan: Findings:  Chronic worsening severe left-sided low back and upper buttock pain worse with standing and twisting and going from sit to stand. She could have pain that is consistent with facet arthropathy and lateral recess narrowing at L4-5 but has not benefited from directed injections at that site. She does have pain in the upper buttock with a positive Fortin finger test and does clinically seem like there is sacroiliac joint pain. She's had diagnostic facet joint block which gave her 100% relief for a few days. The next step would be destruction of the peripheral nerves which would be the dorsal rami of L5-S1 and S2. Over this would give her more definitive long-term relief. If this helps it usually does last more than 6 months to a year. We'll try to seek approval for getting this done. I do think she is a good candidate. She'll continue to follow with Dr. Louanne Skye otherwise. I spent more than 25 minutes speaking face-to-face with the patient with 50% of the time in counseling.    Meds & Orders: No orders  of the defined types were placed in this encounter.  No orders of the defined types were placed in this encounter.   Follow-up: Return for Peripheral nerve destruction of L5 and S1 and S2 if authorized.   Procedures: No procedures performed  No notes on file   Clinical History: Lspine MRI 02/29/2016 IMPRESSION: 1. Moderate left subarticular and  foraminal stenosis at L4-5 secondary to uncovering of a leftward disc protrusion associated with moderate bilateral facet hypertrophy and 7 mm grade 1 anterolisthesis. 2. Mild right subarticular and foraminal stenosis at L4-5. 3. Chronic endplate changes anteriorly at L1-2 with a mild broad-based disc protrusion which partially effaces the ventral CSF but does not create any significant focal stenosis. 4. Multilevel facet hypertrophy throughout the lumbar spine, most prominent at L4-5.  She reports that she has quit smoking. She has never used smokeless tobacco.   Recent Labs  01/24/16 1918 05/04/16 0958 08/27/16 1157  HGBA1C 7.5 8.4* 7.1*    Objective:  VS:  HT:    WT:   BMI:     BP:112/68  HR: bpm  TEMP: ( )  RESP:  Physical Exam  Constitutional: She is oriented to person, place, and time. She appears well-developed and well-nourished.  Eyes: Conjunctivae and EOM are normal. Pupils are equal, round, and reactive to light.  Cardiovascular: Normal rate and intact distal pulses.   Pulmonary/Chest: Effort normal.  Musculoskeletal:  The patient has a positive Fortin finger sign on the left. She has pain with extension rotation to the left. She has no pain over the greater trochanter no pain with hip rotation. She does have a positive Patrick's test of the left. She has good distal strength.  Neurological: She is alert and oriented to person, place, and time. She displays normal reflexes. No sensory deficit. She exhibits normal muscle tone. Coordination normal.  Skin: Skin is warm and dry. No rash noted. No erythema.  Psychiatric: She has a normal mood and affect. Her behavior is normal.  Nursing note and vitals reviewed.   Ortho Exam Imaging: No results found.  Past Medical/Family/Surgical/Social History: Medications & Allergies reviewed per EMR Patient Active Problem List   Diagnosis Date Noted  . Left shoulder pain 06/02/2015  . Insomnia 05/12/2014  . Fibromyalgia  05/12/2014  . Allergic rhinitis 03/28/2014  . Osteoarthritis 12/20/2013  . Cervical strain 06/21/2013  . Low back pain 06/21/2013  . Metatarsalgia of both feet 03/11/2013  . Chest pain 01/25/2013  . Sciatica 01/25/2013  . Restless leg syndrome 11/24/2012  . Pure hypercholesterolemia 10/20/2012  . OSA on CPAP 10/20/2012  . Depression 10/20/2012  . Iron deficiency anemia, unspecified 10/01/2012  . Diabetes mellitus, type 2 ([place]) 12/31/2010  . ANAL FISSURE 12/31/2010  . Obesity 01/08/2007  . CONSTIPATION 01/08/2007   Past Medical History:  Diagnosis Date  . Allergic rhinitis, cause unspecified   . Arthritis   . Constipation   . CTS (carpal tunnel syndrome)   . Cystocele   . Depression   . Diabetes mellitus   . Dysfunction of eustachian tube   . Fatty liver   . Fissure, anal   . Genital herpes   . GERD (gastroesophageal reflux disease)   . Hemorrhoid   . Hx of migraine headaches   . Hyperlipidemia   . Hypertension   . Insomnia   . Iron deficiency anemia, unspecified   . Obesity   . OSA on CPAP   . Rectocele    Family History  Problem Relation Age of Onset  .  Hypertension Mother 21  . Osteoarthritis Mother   . Diabetes Mother   . Colon cancer Mother 65  . Cancer Mother 67    colon cancer  . Colon cancer Paternal Grandmother   . Cancer Paternal Grandmother   . Stroke Maternal Grandmother   . Colon polyps Brother     x 2 brother  . Diabetes Daughter   . Hypertension Daughter   . Sleep apnea Daughter   . Mental illness Daughter     depression  . Migraines Daughter   . Colon polyps Brother   . Cirrhosis Brother     alcoholism  . Alcohol abuse Brother   . Migraines Daughter   . Mental illness Daughter     anxiety attacks  . Protein S deficiency Daughter   . Sleep apnea Brother   . Hyperlipidemia Brother   . Colon cancer Paternal Aunt    Past Surgical History:  Procedure Laterality Date  . ABDOMINAL HYSTERECTOMY  11/11/2010   [place] Plan.  Ovaries intact.   Uterine fibroids with DUB.  [name] CARPAL TUNNEL RELEASE  1989   Left  . CHOLECYSTECTOMY    . CHOLECYSTECTOMY    . COLONOSCOPY  06/29/2012   normal.  Repeat in 5 years.  [name] [place]  . ESOPHAGOGASTRODUODENOSCOPY  12/13/2011   dysphagia.  Henrene Pastor.  Normal.  . Sleep study  09/11/2012   severe OSA.  CPAP titration at 12 cm water pressure.  . TUBAL LIGATION  1988   Social History   Occupational History  . [employer]   Social History Main Topics  . Smoking status: Former Research scientist (life sciences)  . Smokeless tobacco: Never Used     Comment: smoked occasionally longest 6 mos.  . Alcohol use No  . Drug use: No  . Sexual activity: No

## 2016-11-19 NOTE — Telephone Encounter (Signed)
Left message on voicemail regarding no-show appointment today. Asked her to call if she cannot make her next appointment.

## 2016-11-21 ENCOUNTER — Encounter (INDEPENDENT_AMBULATORY_CARE_PROVIDER_SITE_OTHER): Payer: Self-pay | Admitting: Physical Medicine and Rehabilitation

## 2016-11-21 ENCOUNTER — Ambulatory Visit: Payer: 59

## 2016-11-21 DIAGNOSIS — R293 Abnormal posture: Secondary | ICD-10-CM | POA: Diagnosis not present

## 2016-11-21 DIAGNOSIS — M6281 Muscle weakness (generalized): Secondary | ICD-10-CM

## 2016-11-21 DIAGNOSIS — M62830 Muscle spasm of back: Secondary | ICD-10-CM | POA: Diagnosis not present

## 2016-11-21 DIAGNOSIS — G8929 Other chronic pain: Secondary | ICD-10-CM | POA: Diagnosis not present

## 2016-11-21 DIAGNOSIS — M545 Low back pain: Secondary | ICD-10-CM | POA: Diagnosis not present

## 2016-11-21 MED FILL — VICTOZA 2-PAK 18 MG/3 ML PE: 18 | 30 days supply | Qty: 6 | Fill #6

## 2016-11-21 NOTE — Therapy (Signed)
[place], [place], [address] Phone: [phone]   Fax:  [phone]  Physical Therapy Treatment  Patient Details  Name: Cristina [name] MRN: [id number] Date of Birth: November 03, 1961 Referring Provider: Basil Dess. MD  Encounter Date: 11/21/2016      PT End of Session - 11/21/16 1500    Visit Number 2   Number of Visits 12   Date for PT Re-Evaluation 12/20/16   Authorization Type [place] [employer]   PT Start Time 0300   PT Stop Time 0345  heat for last 15 min   PT Time Calculation (min) 45 min   Activity Tolerance Patient tolerated treatment well;No increased pain   Behavior During Therapy WFL for tasks assessed/performed      Past Medical History:  Diagnosis Date  . Allergic rhinitis, cause unspecified   . Arthritis   . Constipation   . CTS (carpal tunnel syndrome)   . Cystocele   . Depression   . Diabetes mellitus   . Dysfunction of eustachian tube   . Fatty liver   . Fissure, anal   . Genital herpes   . GERD (gastroesophageal reflux disease)   . Hemorrhoid   . Hx of migraine headaches   . Hyperlipidemia   . Hypertension   . Insomnia   . Iron deficiency anemia, unspecified   . Obesity   . OSA on CPAP   . Rectocele     Past Surgical History:  Procedure Laterality Date  . ABDOMINAL HYSTERECTOMY  11/11/2010   [place] Plan.  Ovaries intact.  Uterine fibroids with DUB.  [name] CARPAL TUNNEL RELEASE  1989   Left  . CHOLECYSTECTOMY    . CHOLECYSTECTOMY    . COLONOSCOPY  06/29/2012   normal.  Repeat in 5 years.  [name] [place] [facility]  . ESOPHAGOGASTRODUODENOSCOPY  12/13/2011   dysphagia.  Henrene Pastor.  Normal.  . Sleep study  09/11/2012   severe OSA.  CPAP titration at 12 cm water pressure.  . TUBAL LIGATION  1988    There were no vitals filed for this visit.      Subjective Assessment - 11/21/16 1503    Subjective She reports she hurts all over today. They say I have fibromyalgia. Nerve  ablation 12/05/16.  PaIn RT  side not Lt. today.  Arms and shoulder hurt more today. HEP 4-5x/ since last visit.     Currently in Pain? Yes   Pain Score 6    Pain Location Back   Pain Orientation Right   Pain Descriptors / Indicators --  hurts   Pain Type Chronic pain   Pain Radiating Towards Lt posterior leg and thigh posterior today   Pain Onset More than a month ago   Aggravating Factors  walk/sit /stand   Pain Relieving Factors Heat , tylenol   Multiple Pain Sites --  today yes with general body pains [name]                         [facility] Adult PT Treatment/Exercise - 11/21/16 0001      Exercises   Exercises Knee/Hip;Lumbar     Lumbar Exercises: Stretches   Single Knee to Chest Stretch 1 rep;30 seconds   Lower Trunk Rotation 2 reps;20 seconds  RT/LT    Pelvic Tilt Limitations 15 reps 10 sec   Piriformis Stretch 1 rep;30 seconds     Lumbar Exercises: Supine   Bent Knee Raise 5  reps;3 seconds  cued for abdominal setting   Bridge Limitations shoulder bridge x 15     Knee/Hip Exercises: Aerobic   Nustep L4 UE and LE   6 min     Knee/Hip Exercises: Sidelying   Clams 15 reps with cues for position she was able to do correctly.     Modalities   Modalities Moist Heat     Moist Heat Therapy   Number Minutes Moist Heat 15 Minutes   Moist Heat Location Lumbar Spine  supine                PT Education - 11/21/16 1538    Education provided Yes   Education Details Bridge / clam , scissors   Person(s) Educated Patient   Methods Explanation;Demonstration;Tactile cues;Verbal cues;Handout   Comprehension Returned demonstration;Verbalized understanding          PT Short Term Goals - 11/21/16 1541      PT SHORT TERM GOAL #1   Title She will be independent with intial HEP   Status On-going     PT SHORT TERM GOAL #2   Title she will report decr pain and be  able to stand 10 min or more before inc pain   Status On-going     PT SHORT TERM GOAL  #3   Title she will report pain decr so she is able to walk 10 min or more before incr pain.    Status On-going           PT Long Term Goals - 11/12/16 1721      PT LONG TERM GOAL #1   Title she will be independent with all hEP issued    Time 6   Period Weeks   Status New     PT LONG TERM GOAL #2   Title She willbe able to walk for > 20 min due to decr pain   Time 6   Period Weeks   Status New     PT LONG TERM GOAL #3   Title she will report pain as intermittant   Time 6   Period Weeks   Status New     PT LONG TERM GOAL #4   Title She will be able to sit for 45 min before incr pain.    Time 6   Period Weeks   Status New               Plan - 11/21/16 1501    Clinical Impression Statement [name] reported higher pain today but in back she was essentially the same with pain level of 7/10 and no change in function . She was able to do all exercises reported only mild incr soreness and she would be OK. MHP eaed pain some.    PT Treatment/Interventions Iontophoresis 4mg /ml Dexamethasone;Moist Heat;Passive range of motion;Patient/family education;Manual techniques;Taping;Therapeutic exercise;Therapeutic activities;Dry needling   PT Next Visit Plan Add to flexion /stabilization exercise  with HEP , modalities as needed , ionto lateral LT hip, STW to lateral LT thigh and lower back next visit   PT Home Exercise Plan PPT and single knee to chest, side lye clam.  shoulder bridge, scissors     Consulted and Agree with Plan of Care Patient      Patient will benefit from skilled therapeutic intervention in order to improve the following deficits and impairments:  Postural dysfunction, Pain, Decreased strength, Decreased activity tolerance, Increased muscle spasms, Decreased range of motion, Obesity  Visit Diagnosis: Chronic left-sided  low back pain, with sciatica presence unspecified  Abnormal posture  Muscle spasm of back  Weakness of trunk  musculature     Problem List Patient Active Problem List   Diagnosis Date Noted  . Left shoulder pain 06/02/2015  . Insomnia 05/12/2014  . Fibromyalgia 05/12/2014  . Allergic rhinitis 03/28/2014  . Osteoarthritis 12/20/2013  . Cervical strain 06/21/2013  . Low back pain 06/21/2013  . Metatarsalgia of both feet 03/11/2013  . Chest pain 01/25/2013  . Sciatica 01/25/2013  . Restless leg syndrome 11/24/2012  . Pure hypercholesterolemia 10/20/2012  . OSA on CPAP 10/20/2012  . Depression 10/20/2012  . Iron deficiency anemia, unspecified 10/01/2012  . Diabetes mellitus, type 2 ([place]) 12/31/2010  . ANAL FISSURE 12/31/2010  . Obesity 01/08/2007  . CONSTIPATION 01/08/2007    Darrel Hoover  PT 11/21/2016, 3:48 PM  [facility] [address] [place], [place], [address] Phone: [phone]   Fax:  [phone]  Name: ARKIE MARTUCCI MRN: [id number] Date of Birth: [birth year]

## 2016-11-21 NOTE — Patient Instructions (Signed)
From cabinet Shoulder bridge , clam in sidlye and scissors issued 2-3x/day if able and  10-20 reps hold 1-3 sec.

## 2016-11-21 NOTE — Telephone Encounter (Signed)
No precert required for 0000000 or [phone] per [facility]. Ref# [id number]. Called pt and scheduled her for 12/05/16 @ 3:45 w/driver.

## 2016-11-26 ENCOUNTER — Ambulatory Visit: Payer: 59 | Admitting: Physical Therapy

## 2016-11-26 DIAGNOSIS — R293 Abnormal posture: Secondary | ICD-10-CM | POA: Diagnosis not present

## 2016-11-26 DIAGNOSIS — M545 Low back pain: Principal | ICD-10-CM

## 2016-11-26 DIAGNOSIS — G8929 Other chronic pain: Secondary | ICD-10-CM | POA: Diagnosis not present

## 2016-11-26 DIAGNOSIS — M62830 Muscle spasm of back: Secondary | ICD-10-CM | POA: Diagnosis not present

## 2016-11-26 DIAGNOSIS — M6281 Muscle weakness (generalized): Secondary | ICD-10-CM

## 2016-11-26 NOTE — Patient Instructions (Signed)

## 2016-11-26 NOTE — Therapy (Signed)
[place], [place], [address] Phone: [phone]   Fax:  [phone]  Physical Therapy Treatment  Patient Details  Name: Cristina [name] MRN: [id number] Date of Birth: [birth year] Referring Provider: Basil Dess. MD  Encounter Date: 11/26/2016      PT End of Session - 11/26/16 1528    Visit Number 3   Number of Visits 12   Date for PT Re-Evaluation 12/20/16   Authorization Type [place] [employer]   PT Start Time 0315   PT Stop Time 0355   PT Time Calculation (min) 40 min      Past Medical History:  Diagnosis Date  . Allergic rhinitis, cause unspecified   . Arthritis   . Constipation   . CTS (carpal tunnel syndrome)   . Cystocele   . Depression   . Diabetes mellitus   . Dysfunction of eustachian tube   . Fatty liver   . Fissure, anal   . Genital herpes   . GERD (gastroesophageal reflux disease)   . Hemorrhoid   . Hx of migraine headaches   . Hyperlipidemia   . Hypertension   . Insomnia   . Iron deficiency anemia, unspecified   . Obesity   . OSA on CPAP   . Rectocele     Past Surgical History:  Procedure Laterality Date  . ABDOMINAL HYSTERECTOMY  11/11/2010   Marvel Plan.  Ovaries intact.  Uterine fibroids with DUB.  [name] CARPAL TUNNEL RELEASE  1989   Left  . CHOLECYSTECTOMY    . CHOLECYSTECTOMY    . COLONOSCOPY  06/29/2012   normal.  Repeat in 5 years.  [name] [place]  . ESOPHAGOGASTRODUODENOSCOPY  12/13/2011   dysphagia.  Henrene Pastor.  Normal.  . Sleep study  09/11/2012   severe OSA.  CPAP titration at 12 cm water pressure.  . TUBAL LIGATION  1988    There were no vitals filed for this visit.      Subjective Assessment - 11/26/16 1527    Subjective My back is not bothering me today. I feel pretty good today.    Currently in Pain? No/denies                         [facility] Adult PT Treatment/Exercise - 11/26/16 0001      Lumbar Exercises: Stretches   Single Knee to Chest Stretch 2 reps;30 seconds   Piriformis Stretch 2 reps;30 seconds     Lumbar Exercises: Supine   Heel Slides 10 reps   Bent Knee Raise 10 reps   Bent Knee Raise Limitations cues for neutral spine   Bridge Limitations shoulder bridge x 15   Straight Leg Raise 10 reps     Knee/Hip Exercises: Aerobic   Nustep L5 UE and LE   5 min     Knee/Hip Exercises: Sidelying   Clams 20 reps good form      Modalities   Modalities Iontophoresis     Iontophoresis   Type of Iontophoresis Dexamethasone   Location Bilateral hips   Dose 1 Ml x 2    Time 6 hours     Manual Therapy   Manual Therapy Soft tissue mobilization   Soft tissue mobilization massage roller to bilateral lateral hip and ITB                 PT Education - 11/26/16 1601    Education provided Yes   Education Details  Ionto precautions   Person(s) Educated Patient   Methods Explanation   Comprehension Verbalized understanding          PT Short Term Goals - 11/21/16 1541      PT SHORT TERM GOAL #1   Title She will be independent with intial HEP   Status On-going     PT SHORT TERM GOAL #2   Title she will report decr pain and be  able to stand 10 min or more before inc pain   Status On-going     PT SHORT TERM GOAL #3   Title she will report pain decr so she is able to walk 10 min or more before incr pain.    Status On-going           PT Long Term Goals - 11/12/16 1721      PT LONG TERM GOAL #1   Title she will be independent with all hEP issued    Time 6   Period Weeks   Status New     PT LONG TERM GOAL #2   Title She willbe able to walk for > 20 min due to decr pain   Time 6   Period Weeks   Status New     PT LONG TERM GOAL #3   Title she will report pain as intermittant   Time 6   Period Weeks   Status New     PT LONG TERM GOAL #4   Title She will be able to sit for 45 min before incr pain.    Time 6   Period Weeks   Status New               Plan -  11/26/16 1602    Clinical Impression Statement Cristina [name] reports back has been feeling good the last couple of days. She has tenderness in lateral hips. Used massage roller to soften and lengthen fascia restrictions. Began ionto patches to biateral greater trochenaters. Challenged pt with core exercises without increased pain.    PT Next Visit Plan Add to flexion /stabilization exercise  with HEP , modalities as needed , ionto lateral LT hip, STW to lateral LT thigh and lower back as needed, repeat ionto   PT Home Exercise Plan PPT and single knee to chest, side lye clam.  shoulder bridge, scissors     Consulted and Agree with Plan of Care Patient      Patient will benefit from skilled therapeutic intervention in order to improve the following deficits and impairments:  Postural dysfunction, Pain, Decreased strength, Decreased activity tolerance, Increased muscle spasms, Decreased range of motion, Obesity  Visit Diagnosis: Chronic left-sided low back pain, with sciatica presence unspecified  Abnormal posture  Muscle spasm of back  Weakness of trunk musculature     Problem List Patient Active Problem List   Diagnosis Date Noted  . Left shoulder pain 06/02/2015  . Insomnia 05/12/2014  . Fibromyalgia 05/12/2014  . Allergic rhinitis 03/28/2014  . Osteoarthritis 12/20/2013  . Cervical strain 06/21/2013  . Low back pain 06/21/2013  . Metatarsalgia of both feet 03/11/2013  . Chest pain 01/25/2013  . Sciatica 01/25/2013  . Restless leg syndrome 11/24/2012  . Pure hypercholesterolemia 10/20/2012  . OSA on CPAP 10/20/2012  . Depression 10/20/2012  . Iron deficiency anemia, unspecified 10/01/2012  . Diabetes mellitus, type 2 ([place]) 12/31/2010  . ANAL FISSURE 12/31/2010  . Obesity 01/08/2007  . CONSTIPATION 01/08/2007    Dorene Ar , PTA 11/26/2016,  [place], [place], [address] Phone:  [phone]   Fax:  [phone]  Name: Cristina [name] MRN: [id number] Date of Birth: 02-02-1961

## 2016-11-27 ENCOUNTER — Ambulatory Visit: Payer: 59 | Admitting: Family Medicine

## 2016-11-28 ENCOUNTER — Other Ambulatory Visit (HOSPITAL_COMMUNITY): Payer: 59

## 2016-12-02 ENCOUNTER — Encounter: Payer: Self-pay | Admitting: Cardiovascular Disease

## 2016-12-02 ENCOUNTER — Ambulatory Visit (INDEPENDENT_AMBULATORY_CARE_PROVIDER_SITE_OTHER): Payer: 59 | Admitting: Cardiovascular Disease

## 2016-12-02 VITALS — BP 98/70 | HR 98 | Ht 64.0 in | Wt 220.0 lb

## 2016-12-02 DIAGNOSIS — R079 Chest pain, unspecified: Secondary | ICD-10-CM

## 2016-12-02 MED FILL — SIMVASTATIN 40 MG TABLET: 40 | 90 days supply | Qty: 90 | Fill #3

## 2016-12-02 MED FILL — LEVOCETIRIZINE 5 MG TABLET: 5 | 30 days supply | Qty: 30 | Fill #2

## 2016-12-02 MED FILL — ACYCLOVIR 400 MG TABLET: 400 | 90 days supply | Qty: 180 | Fill #3

## 2016-12-02 MED FILL — GABAPENTIN 100 MG CAPSULE: 100 | 30 days supply | Qty: 150 | Fill #3

## 2016-12-02 MED FILL — glipiZIDE XL 10 MG TB24: 10 | 90 days supply | Qty: 90 | Fill #2

## 2016-12-02 MED FILL — BUPROPION HCL SR 100 MG TAB: 100 | 90 days supply | Qty: 90 | Fill #2

## 2016-12-02 NOTE — Patient Instructions (Addendum)
Medication Instructions:  Your physician recommends that you continue on your current medications as directed. Please refer to the Current Medication list given to you today.   -- If you need a refill on your cardiac medications before your next appointment, please call your pharmacy. -  Labwork: None ordered  Testing/Procedures: None ordered  Follow-Up: No follow up is needed at this time with Dr. Nahser.  He will see you on an as needed basis.   Thank you for choosing [facility]!!          

## 2016-12-02 NOTE — Progress Notes (Signed)
Cardiology Office Note   Date:  12/02/2016   ID:  Cristina [name], Cristina [name] 1961/01/01, MRN [id number]  PCP:  Reginia Forts, MD  Cardiologist:   Mertie Moores, MD   Chief Complaint  Patient presents with  . Chest Pain   Problem List 1. Diabetes Mellitus 2. Chest pain -  3. GERD    History of Present Illness: Cristina [name] is a 56 y.o. female who presents for Follow-up evaluation for her episodes of chest pain.  Cristina [name] presented with some episodes of chest pain a month ago. She was seen as a work in visit by Dr. Lovena [name]. A stress Myoview study was performed and revealed no ischemia. She has normal left ventricular  systolic function.   The echocardiogram performed on 11/08/2016 reveals normal left ventricle systolic function. She does have grade 1 diastolic dysfunction.  Has a hx of DM - has been on Lisinopril to protect her kidneys .   Does not get any exercise.    Has 3 children 38, 36, 28 .    Lives by herself.    Non smoker, no ETOH.   Past Medical History:  Diagnosis Date  . Allergic rhinitis, cause unspecified   . Arthritis   . Constipation   . CTS (carpal tunnel syndrome)   . Cystocele   . Depression   . Diabetes mellitus   . Dysfunction of eustachian tube   . Fatty liver   . Fissure, anal   . Genital herpes   . GERD (gastroesophageal reflux disease)   . Hemorrhoid   . Hx of migraine headaches   . Hyperlipidemia   . Hypertension   . Insomnia   . Iron deficiency anemia, unspecified   . Obesity   . OSA on CPAP   . Rectocele     Past Surgical History:  Procedure Laterality Date  . ABDOMINAL HYSTERECTOMY  11/11/2010   Cristina [name].  Ovaries intact.  Uterine fibroids with DUB.  Cristina [name] Kitchen CARPAL TUNNEL RELEASE  1989   Left  . CHOLECYSTECTOMY    . CHOLECYSTECTOMY    . COLONOSCOPY  06/29/2012   normal.  Repeat in 5 years.  Cristina [name] Kitchen Cristina [name]  . ESOPHAGOGASTRODUODENOSCOPY  12/13/2011   dysphagia.  Cristina [name].  Normal.  . Sleep study  09/11/2012   severe OSA.  CPAP  titration at 12 cm water pressure.  . TUBAL LIGATION  1988     Current Outpatient Prescriptions  Medication Sig Dispense Refill  . acyclovir (ZOVIRAX) 400 MG tablet TAKE 1 TABLET BY MOUTH 2 TIMES DAILY. 180 tablet 3  . albuterol (PROAIR HFA) 108 (90 Base) MCG/ACT inhaler Inhale 2 puffs into the lungs every 6 (six) hours as needed. 18 g 1  . ALPRAZolam (XANAX) 0.5 MG tablet TAKE 1 TABLET BY MOUTH ONCE DAILY AS NEEDED FOR ANXIETY 30 tablet 5  . AMBULATORY NON FORMULARY MEDICATION Reported on 10/26/2015    . ANUCORT-HC 25 MG suppository PLACE 1 SUPPOSITORY RECTALLY 2 TIMES DAILY AS NEEDED FOR HEMORRHOIDS. 25 suppository 5  . aspirin EC 81 MG tablet Take 81 mg by mouth daily.    Cristina [name] Kitchen azelastine (ASTELIN) 0.1 % nasal spray PLACE 2 SPRAYS INTO BOTH NOSTRILS 2 TIMES DAILY AS DIRECTED 90 mL 3  . azelastine (OPTIVAR) 0.05 % ophthalmic solution Place 1 drop into both eyes daily.   3  . BIOTIN 5000 PO Take by mouth.    Cristina [name] Kitchen buPROPion (WELLBUTRIN SR) 100 MG 12 hr tablet Take 1 tablet (100 mg total) by  mouth daily. 90 tablet 3  . Calcium Carbonate-Vitamin D (CALCIUM + D PO) Take 1 tablet by mouth daily.    . canagliflozin (INVOKANA) 300 MG TABS tablet Take 1 tablet (300 mg total) by mouth daily before breakfast. 90 tablet 3  . cetirizine (ZYRTEC) 10 MG tablet Take 10 mg by mouth daily.    Cristina [name] Kitchen diltiazem 2 % GEL Apply 1 application topically 5 (five) times daily. 1 Package 3  . docusate sodium (COLACE) 100 MG capsule Take 100 mg by mouth daily. Reported on 05/04/2016    . ferrous sulfate 325 (65 FE) MG EC tablet Take 325 mg by mouth daily.     . fluticasone (FLONASE) 50 MCG/ACT nasal spray PLACE 2 SPRAYS INTO BOTH NOSTRILS DAILY. 48 g 3  . gabapentin (NEURONTIN) 100 MG capsule Take 100 mg by mouth 3 (three) times daily. 216m morning 108mlunch 20045medtime    . glipiZIDE (GLIPIZIDE XL) 10 MG 24 hr tablet TAKE 1 TABLET BY MOUTH DAILY WITH BREAKFAST. 90 tablet 3  . Glucosamine HCl 1000 MG TABS Take 1,000 mg by  mouth 2 (two) times daily with a meal.     . glucose blood (TRUE METRIX BLOOD GLUCOSE TEST) test strip 1 each by Other route 2 (two) times daily as needed for other. Use as instructed    . hydrocortisone 2.5 % ointment Apply topically 2 (two) times daily. 30 g 0  . Insulin Pen Needle (PEN NEEDLES) 31G X 5 MM MISC 1 each by Does not apply route daily. USE TO INJECT VICTOZA DAILY AS DIRECTED 100 each 3  . levocetirizine (XYZAL) 5 MG tablet Take 5 mg by mouth daily.  2  . Liraglutide 18 MG/3ML SOPN Inject 0.2 mLs (1.2 mg total) into the skin daily. 6 mL 11  . lisinopril (PRINIVIL,ZESTRIL) 5 MG tablet Take 1 tablet (5 mg total) by mouth daily. 90 tablet 3  . metFORMIN (GLUCOPHAGE) 1000 MG tablet Take 1 tablet (1,000 mg total) by mouth 2 (two) times daily with a meal. 180 tablet 3  . montelukast (SINGULAIR) 10 MG tablet Take 1 tablet (10 mg total) by mouth at bedtime. 90 tablet 3  . Multiple Vitamin (MULTIVITAMIN) capsule Take 1 capsule by mouth daily.    . nitroGLYCERIN (NITROSTAT) 0.4 MG SL tablet Place 1 tablet (0.4 mg total) under the tongue every 5 (five) minutes as needed for chest pain. 20 tablet 0  . pantoprazole (PROTONIX) 40 MG tablet TAKE 1 TABLET BY MOUTH 2 TIMES DAILY. 180 tablet 3  . rOPINIRole (REQUIP) 1 MG tablet TAKE 1 TABLET BY MOUTH EVERY NIGHT AT BEDTIME. 90 tablet 3  . sertraline (ZOLOFT) 100 MG tablet TAKE 1 & 1/2 TABLET BY MOUTH DAILY 135 tablet 3  . simvastatin (ZOCOR) 40 MG tablet TAKE 1 TABLET BY MOUTH DAILY AT 6 PM. 90 tablet 3  . TRUEPLUS LANCETS 30G MISC USE TO CHECK BLOOD GLUCOSE DAILY AS DIRECTED 100 each 3  . zolpidem (AMBIEN) 5 MG tablet TAKE 1 TABLET BY MOUTH ONCE DAILY AT BEDTIME AS NEEDED 30 tablet 5   No current facility-administered medications for this visit.    Facility-Administered Medications Ordered in Other Visits  Medication Dose Route Frequency Provider Last Rate Last Dose  . technetium tetrofosmin (TC-MYOVIEW) injection 32.40.3llicurie  32.47.4llicurie  Intravenous Once PRN DalLarey DresserD        Allergies:   Fish oil    Social History:  The patient  reports that she has quit  smoking. She has never used smokeless tobacco. She reports that she does not drink alcohol or use drugs.   Family History:  The patient's family history includes Alcohol abuse in her brother; Cancer in her paternal grandmother; Cancer (age of onset: 6) in her mother; Cirrhosis in her brother; Colon cancer in her paternal aunt and paternal grandmother; Colon cancer (age of onset: 45) in her mother; Colon polyps in her brother and brother; Diabetes in her daughter and mother; Hyperlipidemia in her brother; Hypertension in her daughter; Hypertension (age of onset: 42) in her mother; Mental illness in her daughter and daughter; Migraines in her daughter and daughter; Osteoarthritis in her mother; Protein S deficiency in her daughter; Sleep apnea in her brother and daughter; Stroke in her maternal grandmother.    ROS:  Please see the history of present illness.    Review of Systems: Constitutional:  denies fever, chills, diaphoresis, appetite change and fatigue.  HEENT: denies photophobia, eye pain, redness, hearing loss, ear pain, congestion, sore throat, rhinorrhea, sneezing, neck pain, neck stiffness and tinnitus.  Respiratory: denies SOB, DOE, cough, chest tightness, and wheezing.  Cardiovascular: denies chest pain, palpitations and leg swelling.  Gastrointestinal: denies nausea, vomiting, abdominal pain, diarrhea, constipation, blood in stool.  Genitourinary: denies dysuria, urgency, frequency, hematuria, flank pain and difficulty urinating.  Musculoskeletal: denies  myalgias, back pain, joint swelling, arthralgias and gait problem.   Skin: denies pallor, rash and wound.  Neurological: denies dizziness, seizures, syncope, weakness, light-headedness, numbness and headaches.   Hematological: denies adenopathy, easy bruising, personal or family bleeding history.    Psychiatric/ Behavioral: denies suicidal ideation, mood changes, confusion, nervousness, sleep disturbance and agitation.       All other systems are reviewed and negative.    PHYSICAL EXAM: VS:  BP 98/70 (BP Location: Right Arm, Patient Position: Sitting, Cuff Size: Large)   Pulse 98   Ht 5' 4"  (1.626 m)   Wt 220 lb (99.8 kg)   SpO2 96%   BMI 37.76 kg/m  , BMI Body mass index is 37.76 kg/m. GEN: Well nourished, well developed, in no acute distress  HEENT: normal  Neck: no JVD, carotid bruits, or masses Cardiac: RRR; no murmurs, rubs, or gallops,no edema  Respiratory:  clear to auscultation bilaterally, normal work of breathing GI: soft, nontender, nondistended, + BS MS: no deformity or atrophy  Skin: warm and dry, no rash Neuro:  Strength and sensation are intact Psych: normal   EKG:  EKG is not ordered today. The ekg ordered today demonstrates    Recent Labs: 10/30/2016: ALT 44; BUN 15; Creatinine, Ser 0.81; Potassium 4.7; Sodium 140 11/07/2016: Hemoglobin 13.9; Platelets 343; TSH 1.33    Lipid Panel    Component Value Date/Time   CHOL 172 08/27/2016 1157   TRIG 116 08/27/2016 1157   HDL 46 08/27/2016 1157   CHOLHDL 3.7 08/27/2016 1157   VLDL 23 08/27/2016 1157   LDLCALC 103 08/27/2016 1157      Wt Readings from Last 3 Encounters:  12/02/16 220 lb (99.8 kg)  11/12/16 224 lb (101.6 kg)  11/07/16 224 lb (101.6 kg)      Other studies Reviewed: Additional studies/ records that were reviewed today include: . Review of the above records demonstrates:    ASSESSMENT AND [name]:  1.  Chest pain. His her episodes of chest pain are very atypical. I suspect that these were due to gastroesophageal reflux disease. She had been drinking quite a few [employer].  Her Lexiscan Myoview study  showed no evidence of ischemia. She has hyperdynamic left ventricle systolic motion. Echo card gram shows normal left ventricle systolic motion with some diastolic  dysfunction-grade 1. I've recommended that she start a regular exercise program to work on weight loss program.  Resting heart rate is on the higher end of normal but I do not think that she'll tolerate a beta blocker because of her relatively low blood pressure. I think that her fast resting heart rate will improve with weight loss and initiation of an exercise regimen.  2. Diabetes mellitus: I've encouraged her to exercise. I've advised her to stop drinking sugary soft drinks as these will cause her glucose levels to increase. She'll follow-up with her general medical doctor.  3. Elevated white blood cell count. She has a chronically elevated WC. She has greater than expected lymphocytes. I'm not sure if this is actually CLL. She has been referred to a hematologist and needs to make  that appointment.   Current medicines are reviewed at length with the patient today.  The patient does not have concerns regarding medicines.  Labs/ tests ordered today include:  No orders of the defined types were placed in this encounter.    Disposition:   FU with me as needed.      Mertie Moores, MD  12/02/2016 9:10 AM    [facility] [place], [place], [place]  [address] Phone: [phone]; Fax: [phone]

## 2016-12-03 ENCOUNTER — Ambulatory Visit: Payer: 59 | Admitting: Physical Therapy

## 2016-12-05 ENCOUNTER — Ambulatory Visit (INDEPENDENT_AMBULATORY_CARE_PROVIDER_SITE_OTHER): Payer: Self-pay

## 2016-12-05 ENCOUNTER — Encounter: Payer: Self-pay | Admitting: Family Medicine

## 2016-12-05 ENCOUNTER — Encounter (INDEPENDENT_AMBULATORY_CARE_PROVIDER_SITE_OTHER): Payer: Self-pay | Admitting: Physical Medicine and Rehabilitation

## 2016-12-05 ENCOUNTER — Ambulatory Visit: Payer: 59

## 2016-12-05 ENCOUNTER — Ambulatory Visit (INDEPENDENT_AMBULATORY_CARE_PROVIDER_SITE_OTHER): Payer: 59 | Admitting: Physical Medicine and Rehabilitation

## 2016-12-05 VITALS — BP 116/72 | HR 97 | Temp 98.0°F

## 2016-12-05 DIAGNOSIS — M47816 Spondylosis without myelopathy or radiculopathy, lumbar region: Secondary | ICD-10-CM | POA: Diagnosis not present

## 2016-12-05 DIAGNOSIS — M461 Sacroiliitis, not elsewhere classified: Secondary | ICD-10-CM

## 2016-12-05 DIAGNOSIS — E119 Type 2 diabetes mellitus without complications: Secondary | ICD-10-CM | POA: Diagnosis not present

## 2016-12-05 DIAGNOSIS — H40013 Open angle with borderline findings, low risk, bilateral: Secondary | ICD-10-CM | POA: Diagnosis not present

## 2016-12-05 LAB — HM DIABETES EYE EXAM

## 2016-12-05 MED ORDER — LIDOCAINE HCL (PF) 1 % IJ SOLN: 0.3300 mL | Freq: Once | INTRAMUSCULAR | Status: DC

## 2016-12-05 MED ORDER — METHYLPREDNISOLONE ACETATE 80 MG/ML IJ SUSP
80.0000 mg | Freq: Once | INTRAMUSCULAR | Status: AC
  Administered 2016-12-05: 80 mg

## 2016-12-05 NOTE — Patient Instructions (Signed)

## 2016-12-05 NOTE — Progress Notes (Signed)
Cristina [name] - 56 y.o. female MRN [id number]  Date of birth: [birth year]  Office Visit Note: Visit Date: 12/05/2016 PCP: Reginia Forts, MD Referred by: [name], MD  Subjective: Chief Complaint  Patient presents with  . Lower Back - Pain   HPI: Cristina [name] is a 56 year old female with chronic pain syndrome and chronic low back and pelvic pain and left sacroiliac joint pain. She is here today for planned left side radiofrequency ablation. States pain is more in the center today.    ROS Otherwise per HPI.  Assessment & Plan: Visit Diagnoses:  1. Spondylosis without myelopathy or radiculopathy, lumbar region   2. Sacroiliitis ([place])     Plan: Findings:   Please see our prior evaluation and management note for further details and justification.    Meds & Orders:  Meds ordered this encounter  Medications  . lidocaine (PF) (XYLOCAINE) 1 % injection 0.3 mL  . methylPREDNISolone acetate (DEPO-MEDROL) injection 80 mg    Orders Placed This Encounter  Procedures  . Radiofrequency,Lumbar  . XR C-ARM NO REPORT    Follow-up: Return for Scheduled follow-up with Dr. Louanne Skye and 4-5 weeks.   Procedures: No procedures performed  Sacroiliac Joint Peripheral Nerve Denervation - Posterior Approach with Fluoroscopic Guidance   Patient: Cristina [name]      Date of Birth: 1961-08-19 MRN: [id number] PCP: Reginia Forts, MD      Visit Date: 12/05/2016   Universal Protocol:    Date/Time: 01/25/184:09 PM  Consent Given By: the patient  Position: PRONE  Additional Comments: Vital signs were monitored before and after the procedure. Patient was prepped and draped in the usual sterile fashion. The correct patient, procedure, and site was verified.   Injection Procedure Details:  Procedure Site One Meds Administered:  Meds ordered this encounter  Medications  . lidocaine (PF) (XYLOCAINE) 1 % injection 0.3 mL  . methylPREDNISolone acetate (DEPO-MEDROL) injection 80 mg      Laterality: Left  Location/Site: The dorsal and lateral branches of L5, S1 and S2 to denervate the sacroiliac joint on the left. L5-S1 S1-2 S2-3  Needle size: 18 G  Needle type: Stryker radiofrequency cannula  Needle Placement: As described below  Findings:  -Contrast Used: N/A   -Comments: There was excellent fluoroscopic shooting of the cannulas with good stimulation.  Procedure Details: For the L5, S1 and S2 dorsal rami the fluoroscope was positioned to square off the sacrum or sacral ala to achieve a true AP midline view.  For the L5 dorsal rami the beam was then obliqued 15 to 20 degrees and caudally tilted 15 to 20 degrees to line up a trajectory along the target nerve. The skin over the target of the junction of superior articulating process and sacral ala was infiltrated with local anesthetic.  The 18 gauge 37mm active tip outer cannula was advanced in trajectory view to the target. The outer cannula placement was fine-tuned and the position was then confirmed with bi-planar imaging.  For the S1 and S2 dorsal rami, no obliquity was utilized but the same caudal tilt was used.  Three targets corresponding to the 12 o'clock and 6 o'clock and the lateral mid-line positions around the foramen were  visualized. The 18 gauge 67mm active tip outer cannula was advanced in trajectory view to each target. The outer cannula placement was fine-tuned and the position was then confirmed with bi-planar imaging  This procedure was repeated for each target nerve position.  Then, for all levels, the outer cannula  placement was fine-tuned and the position was then confirmed with bi-planar imaging.    Test stimulation was done both at sensory and motor levels to ensure there was no radicular stimulation. The target tissues were then infiltrated with 1 ml of 1% Lidocaine without Epinephrine. Subsequently, a percutaneous neurotomy was carried out for 60 seconds at 80 degrees Celsius. The procedure was  repeated with the cannula rotated 90 degrees, for duration of 60 seconds, one additional time at each level for a total of two lesions at the L5 dorsal rami.  One 60 second neurotomy was performed for each target around the S1 and S2 foramen. After the completion of the respective lesions, 1 ml of injectate was delivered.  Appropriate radiographs were obtained to verify the probe placement during the neurotomy.  Additional Comments:  The patient tolerated the procedure well Dressing: Band-Aid    Post-procedure details: Patient was observed during the procedure. Post-procedure instructions were reviewed.  Patient left the clinic in stable condition.     Clinical History: Lspine MRI 02/29/2016 IMPRESSION: 1. 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 2. Mild right subarticular and foraminal stenosis at L4-5. 3. Chronic endplate changes anteriorly at L1-2 with a mild broad-based disc protrusion which partially effaces the ventral CSF but does not create any significant focal stenosis. 4. Multilevel facet hypertrophy throughout the lumbar spine, most prominent at L4-5.  She reports that she has quit smoking. She has never used smokeless tobacco.   Recent Labs  05/04/16 0958 08/27/16 1157 12/07/16 0907  HGBA1C 8.4* 7.1* 7.7    Objective:  VS:  HT:    WT:   BMI:     BP:116/72  HR:97bpm  TEMP:98 F (36.7 C)(Oral)  RESP:98 % Physical Exam  Musculoskeletal:  Obese female ambulates without aid. She is slow to rise from a seated position. She has a positive Fortin finger test on the left. She has good distal strength.    Ortho Exam Imaging: No results found.  Past Medical/Family/Surgical/Social History: Medications & Allergies reviewed per EMR Patient Active Problem List   Diagnosis Date Noted  . Left shoulder pain  06/02/2015  . Insomnia 05/12/2014  . Fibromyalgia 05/12/2014  . Allergic rhinitis 03/28/2014  . Osteoarthritis 12/20/2013  . Cervical strain 06/21/2013  . Low back pain 06/21/2013  . Metatarsalgia of both feet 03/11/2013  . Chest pain 01/25/2013  . Sciatica 01/25/2013  . Restless leg syndrome 11/24/2012  . Pure hypercholesterolemia 10/20/2012  . OSA on CPAP 10/20/2012  . Depression 10/20/2012  . Iron deficiency anemia, unspecified 10/01/2012  . Diabetes mellitus, type 2 ([place]) 12/31/2010  . ANAL FISSURE 12/31/2010  . Obesity 01/08/2007  . CONSTIPATION 01/08/2007   Past Medical History:  Diagnosis Date  . Allergic rhinitis, cause unspecified   . Arthritis   . Constipation   . CTS (carpal tunnel syndrome)   . Cystocele   . Depression   . Diabetes mellitus   . Dysfunction of eustachian tube   . Fatty liver   . Fissure, anal   . Genital herpes   . GERD (gastroesophageal reflux disease)   . Hemorrhoid   . Hx of migraine headaches   . Hyperlipidemia   . Hypertension   . Insomnia   . Iron deficiency anemia, unspecified   . Obesity   . OSA on CPAP   . Rectocele    Family History  Problem Relation Age of Onset  . Hypertension Mother 16  . Osteoarthritis Mother   . Diabetes Mother   . Colon cancer Mother 27  . Cancer Mother 2    colon cancer  . Colon cancer Paternal Grandmother   . Cancer Paternal Grandmother   . Stroke Maternal Grandmother   . Colon polyps Brother     x 2 brother  . Diabetes Daughter   . Hypertension Daughter   . Sleep apnea Daughter   . Mental illness Daughter     depression  . Migraines Daughter   . Colon polyps Brother   . Cirrhosis Brother     alcoholism  . Alcohol abuse Brother   . Migraines Daughter   . Mental illness Daughter     anxiety attacks  . Protein S deficiency Daughter   . Sleep apnea Brother   . Hyperlipidemia Brother   . Colon cancer Paternal Aunt    Past Surgical History:  Procedure Laterality Date  . ABDOMINAL  HYSTERECTOMY  11/11/2010   [place].  Ovaries intact.  Uterine fibroids with DUB.  [name] CARPAL TUNNEL RELEASE  1989   Left  . CHOLECYSTECTOMY    . CHOLECYSTECTOMY    . COLONOSCOPY  06/29/2012   normal.  Repeat in 5 years.  [name] [place]  . ESOPHAGOGASTRODUODENOSCOPY  12/13/2011   dysphagia.  Henrene Pastor.  Normal.  . Sleep study  09/11/2012   severe OSA.  CPAP titration at 12 cm water pressure.  . TUBAL LIGATION  1988   Social History   Occupational History  . [employer]   Social History Main Topics  . Smoking status: Former Research scientist (life sciences)  . Smokeless tobacco: Never Used     Comment: smoked occasionally longest 6 mos.  . Alcohol use No  . Drug use: No  . Sexual activity: No

## 2016-12-05 NOTE — Procedures (Signed)
Sacroiliac Joint Peripheral Nerve Denervation - Posterior Approach with Fluoroscopic Guidance   Patient: Cristina [name]      Date of Birth: 07-19-1961 MRN: [id number] PCP: Reginia Forts, MD      Visit Date: 12/05/2016   Universal Protocol:    Date/Time: 01/25/184:09 PM  Consent Given By: the patient  Position: PRONE  Additional Comments: Vital signs were monitored before and after the procedure. Patient was prepped and draped in the usual sterile fashion. The correct patient, procedure, and site was verified.   Injection Procedure Details:  Procedure Site One Meds Administered:  Meds ordered this encounter  Medications  . lidocaine (PF) (XYLOCAINE) 1 % injection 0.3 mL  . methylPREDNISolone acetate (DEPO-MEDROL) injection 80 mg     Laterality: Left  Location/Site: The dorsal and lateral branches of L5, S1 and S2 to denervate the sacroiliac joint on the left. L5-S1 S1-2 S2-3  Needle size: 18 G  Needle type: Stryker radiofrequency cannula  Needle Placement: As described below  Findings:  -Contrast Used: N/A   -Comments: There was excellent fluoroscopic shooting of the cannulas with good stimulation.  Procedure Details: For the L5, S1 and S2 dorsal rami the fluoroscope was positioned to square off the sacrum or sacral ala to achieve a true AP midline view.  For the L5 dorsal rami the beam was then obliqued 15 to 20 degrees and caudally tilted 15 to 20 degrees to line up a trajectory along the target nerve. The skin over the target of the junction of superior articulating process and sacral ala was infiltrated with local anesthetic.  The 18 gauge 19mm active tip outer cannula was advanced in trajectory view to the target. The outer cannula placement was fine-tuned and the position was then confirmed with bi-planar imaging.  For the S1 and S2 dorsal rami, no obliquity was utilized but the same caudal tilt was used.  Three targets corresponding to the 12 o'clock and 6 o'clock  and the lateral mid-line positions around the foramen were  visualized. The 18 gauge 79mm active tip outer cannula was advanced in trajectory view to each target. The outer cannula placement was fine-tuned and the position was then confirmed with bi-planar imaging  This procedure was repeated for each target nerve position.  Then, for all levels, the outer cannula placement was fine-tuned and the position was then confirmed with bi-planar imaging.    Test stimulation was done both at sensory and motor levels to ensure there was no radicular stimulation. The target tissues were then infiltrated with 1 ml of 1% Lidocaine without Epinephrine. Subsequently, a percutaneous neurotomy was carried out for 60 seconds at 80 degrees Celsius. The procedure was repeated with the cannula rotated 90 degrees, for duration of 60 seconds, one additional time at each level for a total of two lesions at the L5 dorsal rami.  One 60 second neurotomy was performed for each target around the S1 and S2 foramen. After the completion of the respective lesions, 1 ml of injectate was delivered.  Appropriate radiographs were obtained to verify the probe placement during the neurotomy.  Additional Comments:  The patient tolerated the procedure well Dressing: Band-Aid    Post-procedure details: Patient was observed during the procedure. Post-procedure instructions were reviewed.  Patient left the clinic in stable condition.

## 2016-12-07 ENCOUNTER — Ambulatory Visit (INDEPENDENT_AMBULATORY_CARE_PROVIDER_SITE_OTHER): Payer: 59 | Admitting: Family Medicine

## 2016-12-07 VITALS — BP 110/80 | HR 94 | Temp 97.8°F | Resp 18 | Ht 60.25 in | Wt 224.0 lb

## 2016-12-07 DIAGNOSIS — Z9989 Dependence on other enabling machines and devices: Secondary | ICD-10-CM

## 2016-12-07 DIAGNOSIS — M5432 Sciatica, left side: Secondary | ICD-10-CM | POA: Diagnosis not present

## 2016-12-07 DIAGNOSIS — F324 Major depressive disorder, single episode, in partial remission: Secondary | ICD-10-CM | POA: Diagnosis not present

## 2016-12-07 DIAGNOSIS — E7800 Pure hypercholesterolemia, unspecified: Secondary | ICD-10-CM | POA: Diagnosis not present

## 2016-12-07 DIAGNOSIS — R0789 Other chest pain: Secondary | ICD-10-CM

## 2016-12-07 DIAGNOSIS — G4733 Obstructive sleep apnea (adult) (pediatric): Secondary | ICD-10-CM

## 2016-12-07 DIAGNOSIS — F5104 Psychophysiologic insomnia: Secondary | ICD-10-CM | POA: Diagnosis not present

## 2016-12-07 DIAGNOSIS — E1142 Type 2 diabetes mellitus with diabetic polyneuropathy: Secondary | ICD-10-CM | POA: Diagnosis not present

## 2016-12-07 LAB — POCT GLYCOSYLATED HEMOGLOBIN (HGB A1C): HEMOGLOBIN A1C: 7.7

## 2016-12-07 MED ORDER — LIRAGLUTIDE 18 MG/3ML ~~LOC~~ SOPN: 1.8000 mg | PEN_INJECTOR | Freq: Every day | SUBCUTANEOUS | 11 refills | Status: DC

## 2016-12-07 MED ORDER — GABAPENTIN 300 MG PO CAPS: 300.0000 mg | ORAL_CAPSULE | Freq: Three times a day (TID) | ORAL | 1 refills | Status: DC

## 2016-12-07 NOTE — Progress Notes (Signed)
Subjective:    Patient ID: Cristina [name], female    DOB: 07-May-1961, 56 y.o.   MRN: [id number]  12/07/2016  diabetic follow-up (3 mth f/u) and Depression (per triage screening)   HPI This 56 y.o. female presents for three month follow-up for DMII, hypertension, hypercholesterolemia, anxiety/depression.  S/p stress testing low risk.  Realized that drinking too many [place]. Dews; s/p ablation on lower back this week.  Will take 3-4 weeks to take full effect; Dr. Ernestina Patches.  Morene Crocker has restricted [place]. Dew to caffeine free and diet.  Morene Crocker now only stays with patient on weekends.   Daughter helping with pill container; thinks missed Sertraline for 1-2 weeks.  Lacking motivation and interest in things.  Onset one month ago.  Started Sertraline back one week ago.  Hoping to get better.  Not sleeping well.  Takes forever to go to sleep; takes 2 hours to go to sleep. Stopped Ambien.  Taking Requip and Gabapentin 300mg  qhs.  Nitka started on Gabapentin 100mg  three every morning, none at lunch, three at bedtime.  Sugars are pretty good; not too high; 130s.  Requesting Phentermine; not exercising.   Immunization History  Administered Date(s) Administered  . Hepatitis B 11/12/1999  . Influenza Split 11/11/2010, 08/14/2012, 08/25/2014  . Influenza-Unspecified 07/12/2016  . Pneumococcal Polysaccharide-23 11/12/2007, 05/04/2016  . Td 11/11/1998  . Tdap 02/22/2010   BP Readings from Last 3 Encounters:  12/07/16 110/80  12/05/16 116/72  12/02/16 98/70   Wt Readings from Last 3 Encounters:  12/07/16 224 lb (101.6 kg)  12/02/16 220 lb (99.8 kg)  11/12/16 224 lb (101.6 kg)     Review of Systems  Constitutional: Negative for chills, diaphoresis, fatigue and fever.  Eyes: Negative for visual disturbance.  Respiratory: Negative for cough and shortness of breath.   Cardiovascular: Negative for chest pain, palpitations and leg swelling.  Gastrointestinal: Negative for abdominal pain, constipation, diarrhea, nausea  and vomiting.  Endocrine: Negative for cold intolerance, heat intolerance, polydipsia, polyphagia and polyuria.  Neurological: Negative for dizziness, tremors, seizures, syncope, facial asymmetry, speech difficulty, weakness, light-headedness, numbness and headaches.  Psychiatric/Behavioral: Positive for dysphoric mood and sleep disturbance. Negative for self-injury and suicidal ideas.    Past Medical History:  Diagnosis Date  . Allergic rhinitis, cause unspecified   . Arthritis   . Constipation   . CTS (carpal tunnel syndrome)   . Cystocele   . Depression   . Diabetes mellitus   . Dysfunction of eustachian tube   . Fatty liver   . Fissure, anal   . Genital herpes   . GERD (gastroesophageal reflux disease)   . Hemorrhoid   . Hx of migraine headaches   . Hyperlipidemia   . Hypertension   . Insomnia   . Iron deficiency anemia, unspecified   . Obesity   . OSA on CPAP   . Rectocele    Past Surgical History:  Procedure Laterality Date  . ABDOMINAL HYSTERECTOMY  11/11/2010   [place] Plan.  Ovaries intact.  Uterine fibroids with DUB.  [name] CARPAL TUNNEL RELEASE  1989   Left  . CHOLECYSTECTOMY    . CHOLECYSTECTOMY    . COLONOSCOPY  06/29/2012   normal.  Repeat in 5 years.  [name] [place]  . ESOPHAGOGASTRODUODENOSCOPY  12/13/2011   dysphagia.  Henrene Pastor.  Normal.  . Sleep study  09/11/2012   severe OSA.  CPAP titration at 12 cm water pressure.  . TUBAL LIGATION  1988   Allergies  Allergen  Reactions  . Fish Oil Rash    Social History   Social History  . Marital status: Single    Spouse name: n/a  . Number of children: 3  . Years of education: college   Occupational History  . [employer]   Social History Main Topics  . Smoking status: Former Research scientist (life sciences)  . Smokeless tobacco: Never Used     Comment: smoked occasionally longest 6 mos.  . Alcohol use No  . Drug use: No  . Sexual activity: No   Other Topics  Concern  . Not on file   Social History Narrative      Marital status:  Divorced in 2000 after six years of marriage; not dating in 2017.      Children:  3 daughters (38, 28, 41); 6 grandchildren.      Lives: alone.  2 dogs; grandson stays some.      Employment:  Working at [employer]. 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      Tobacco: none      Alcohol: socially; special occasions.  Rare.      Drugs: none      Exercise:  Sporadic; stationary bike.      Sexual activity:  Not sexually active since 2011.       Guns:  No guns in the home.       Smoke detectors in use,       Seatbelt:  Sometimes uses seat belts. 75% of time.  No texting while driving.     Family History  Problem Relation Age of Onset  . Hypertension Mother 41  . Osteoarthritis Mother   . Diabetes Mother   . Colon cancer Mother 68  . Cancer Mother 71    colon cancer  . Colon cancer Paternal Grandmother   . Cancer Paternal Grandmother   . Stroke Maternal Grandmother   . Colon polyps Brother     x 2 brother  . Diabetes Daughter   . Hypertension Daughter   . Sleep apnea Daughter   . Mental illness Daughter     depression  . Migraines Daughter   . Colon polyps Brother   . Cirrhosis Brother     alcoholism  . Alcohol abuse Brother   . Migraines Daughter   . Mental illness Daughter     anxiety attacks  . Protein S deficiency Daughter   . Sleep apnea Brother   . Hyperlipidemia Brother   . Colon cancer Paternal Aunt        Objective:    BP 110/80   Pulse 94   Temp 97.8 F (36.6 C) (Oral)   Resp 18   Ht 5' 0.25" (1.53 m)   Wt 224 lb (101.6 kg)   SpO2 96%   BMI 43.38 kg/m  Physical Exam  Constitutional: She is oriented to person, place, and time. She appears well-developed and well-nourished. No distress.  HENT:  Head: Normocephalic and atraumatic.  Right Ear: External ear normal.  Left Ear: External ear normal.  Nose: Nose normal.  Mouth/Throat: Oropharynx is clear and moist.  Eyes: Conjunctivae  and EOM are normal. Pupils are equal, round, and reactive to light.  Neck: Normal range of motion. Neck supple. Carotid bruit is not present. No thyromegaly present.  Cardiovascular: Normal rate, regular rhythm, normal heart sounds and intact distal pulses.  Exam reveals no gallop and no friction rub.   No murmur heard. Pulmonary/Chest: Effort normal and breath  sounds normal. She has no wheezes. She has no rales.  Abdominal: Soft. Bowel sounds are normal. She exhibits no distension and no mass. There is no tenderness. There is no rebound and no guarding.  Lymphadenopathy:    She has no cervical adenopathy.  Neurological: She is alert and oriented to person, place, and time. No cranial nerve deficit.  Skin: Skin is warm and dry. No rash noted. She is not diaphoretic. No erythema. No pallor.  Psychiatric: She has a normal mood and affect. Her behavior is normal.    Depression screen [facility] 2/9 12/07/2016 10/30/2016 08/27/2016 08/15/2016 05/04/2016  Decreased Interest 3 0 0 1 0  Down, Depressed, Hopeless 1 0 0 1 0  PHQ - 2 Score 4 0 0 2 0  Altered sleeping 1 - - - -  Tired, decreased energy 3 - - - -  Change in appetite 3 - - - -  Feeling bad or failure about yourself  0 - - - -  Trouble concentrating 2 - - - -  Moving slowly or fidgety/restless 0 - - - -  Suicidal thoughts 0 - - - -  PHQ-9 Score 13 - - - -  Difficult doing work/chores Very difficult - - - -  Some recent data might be hidden       Assessment & Plan:   1. Type 2 diabetes mellitus with diabetic polyneuropathy, without long-term current use of insulin ([place])   2. Pure hypercholesterolemia   3. OSA on CPAP   4. Major depressive disorder with single episode, in partial remission (HCC)   5. Other chest pain   6. Psychophysiological insomnia   7. Sciatica of left side   8. Morbid obesity ([place])    -uncontrolled; recommend exercise, weight loss, and low-sugar food choices; highly recommend avoiding all [place]. Dew.  Increase  Victoza to 1.8mg  daily. -obtain labs.  -continue current medications. -recommend consultation with Dr. Leafy Ro of bariatric medication in upcoming six months. -increase Gabapentin to 300mg  tid for worsening lower back pain. Not sleeping well; recommend one Gabapentin every morning and two at bedtime. -s/p stress testing and cardiology consultation for chest pain; stress testing negative; chest pain due to excessive caffeine intake with palpitations.   Orders Placed This Encounter  Procedures  . CBC with Differential/Platelet  . Comprehensive metabolic panel    Order Specific Question:   Has the patient fasted?    Answer:   Yes  . Lipid panel    Order Specific Question:   Has the patient fasted?    Answer:   Yes  . POCT glycosylated hemoglobin (Hb A1C)   Meds ordered this encounter  Medications  . liraglutide 18 MG/3ML SOPN    Sig: Inject 0.3 mLs (1.8 mg total) into the skin daily.    Dispense:  6 mL    Refill:  11  . gabapentin (NEURONTIN) 300 MG capsule    Sig: Take 1 capsule (300 mg total) by mouth 3 (three) times daily.    Dispense:  270 capsule    Refill:  1    Return in about 3 months (around 03/07/2017) for recheck DIABETES.   Mick Tanguma Elayne Guerin, M.D. Primary Care at [facility] [address] [place], [place]  [phone] phone [phone] fax

## 2016-12-07 NOTE — Patient Instructions (Addendum)
INCREASE GABAPENTIN 300MG  ONE CAPSULE EVERY MORNING AND 2 CAPSULES AT BEDTIME.    IF you received an x-ray today, you will receive an invoice from [facility]. Please contact [facility] at [phone] with questions or concerns regarding your invoice.   IF you received labwork today, you will receive an invoice from [facility] [facility]. Please contact [facility] at [phone] with questions or concerns regarding your invoice.   Our billing staff will not be able to assist you with questions regarding bills from these companies.  You will be contacted with the lab results as soon as they are available. The fastest way to get your results is to activate your My Chart account. Instructions are located on the last page of this paperwork. If you have not heard from [name] regarding the results in 2 weeks, please contact this office.

## 2016-12-08 LAB — CBC WITH DIFFERENTIAL/PLATELET
BASOS ABS: 0 10*3/uL (ref 0.0–0.2)
Basos: 0 %
EOS (ABSOLUTE): 0.1 10*3/uL (ref 0.0–0.4)
Eos: 1 %
HEMATOCRIT: 43.7 % (ref 34.0–46.6)
HEMOGLOBIN: 14.5 g/dL (ref 11.1–15.9)
IMMATURE GRANS (ABS): 0 10*3/uL (ref 0.0–0.1)
Immature Granulocytes: 0 %
LYMPHS ABS: 4.9 10*3/uL — AB (ref 0.7–3.1)
LYMPHS: 29 %
MCH: 27.8 pg (ref 26.6–33.0)
MCHC: 33.2 g/dL (ref 31.5–35.7)
MCV: 84 fL (ref 79–97)
Monocytes Absolute: 0.9 10*3/uL (ref 0.1–0.9)
Monocytes: 5 %
NEUTROS ABS: 11.4 10*3/uL — AB (ref 1.4–7.0)
Neutrophils: 65 %
Platelets: 381 10*3/uL — ABNORMAL HIGH (ref 150–379)
RBC: 5.21 x10E6/uL (ref 3.77–5.28)
RDW: 15.7 % — ABNORMAL HIGH (ref 12.3–15.4)
WBC: 17.3 10*3/uL — AB (ref 3.4–10.8)

## 2016-12-08 LAB — COMPREHENSIVE METABOLIC PANEL
A/G RATIO: 1.3 (ref 1.2–2.2)
ALBUMIN: 4.5 g/dL (ref 3.5–5.5)
ALT: 48 IU/L — ABNORMAL HIGH (ref 0–32)
AST: 23 IU/L (ref 0–40)
Alkaline Phosphatase: 86 IU/L (ref 39–117)
BUN/Creatinine Ratio: 24 — ABNORMAL HIGH (ref 9–23)
BUN: 18 mg/dL (ref 6–24)
Bilirubin Total: 0.3 mg/dL (ref 0.0–1.2)
CALCIUM: 10.4 mg/dL — AB (ref 8.7–10.2)
CO2: 20 mmol/L (ref 18–29)
CREATININE: 0.76 mg/dL (ref 0.57–1.00)
Chloride: 98 mmol/L (ref 96–106)
GFR, EST AFRICAN AMERICAN: 104 mL/min/{1.73_m2} (ref >59)
GFR, EST NON AFRICAN AMERICAN: 90 mL/min/{1.73_m2} (ref >59)
GLOBULIN, TOTAL: 3.4 g/dL (ref 1.5–4.5)
Glucose: 180 mg/dL — ABNORMAL HIGH (ref 65–99)
POTASSIUM: 5.1 mmol/L (ref 3.5–5.2)
SODIUM: 142 mmol/L (ref 134–144)
TOTAL PROTEIN: 7.9 g/dL (ref 6.0–8.5)

## 2016-12-08 LAB — LIPID PANEL
CHOL/HDL RATIO: 4.1 ratio (ref 0.0–4.4)
Cholesterol, Total: 194 mg/dL (ref 100–199)
HDL: 47 mg/dL (ref >39)
LDL CALC: 112 mg/dL — AB (ref 0–99)
TRIGLYCERIDES: 177 mg/dL — AB (ref 0–149)
VLDL Cholesterol Cal: 35 mg/dL (ref 5–40)

## 2016-12-09 ENCOUNTER — Encounter: Payer: Self-pay | Admitting: Family Medicine

## 2016-12-09 MED FILL — GABAPENTIN 300 MG CAPSULE: 300 | 90 days supply | Qty: 270 | Fill #0

## 2016-12-10 ENCOUNTER — Ambulatory Visit: Payer: 59 | Admitting: Physical Therapy

## 2016-12-10 DIAGNOSIS — M545 Low back pain: Principal | ICD-10-CM

## 2016-12-10 DIAGNOSIS — M62830 Muscle spasm of back: Secondary | ICD-10-CM

## 2016-12-10 DIAGNOSIS — R293 Abnormal posture: Secondary | ICD-10-CM | POA: Diagnosis not present

## 2016-12-10 DIAGNOSIS — M6281 Muscle weakness (generalized): Secondary | ICD-10-CM

## 2016-12-10 DIAGNOSIS — G8929 Other chronic pain: Secondary | ICD-10-CM

## 2016-12-10 NOTE — Therapy (Addendum)
[facility] [address] [place], [place], [address] Phone: [phone]   Fax:  [phone]  Physical Therapy TreatmentDischarge  Patient Details  Name: Cristina [name] MRN: [id number] Date of Birth: Feb 09, 1961 Referring Provider: Vira Browns. MD  Encounter Date: 12/10/2016      PT End of Session - 12/10/16 1714    Visit Number 4   Number of Visits 12   Date for PT Re-Evaluation 12/20/16   Authorization Type [place] [facility]   PT Start Time 0425   PT Stop Time 0510   PT Time Calculation (min) 45 min      Past Medical History:  Diagnosis Date  . Allergic rhinitis, cause unspecified   . Arthritis   . Constipation   . CTS (carpal tunnel syndrome)   . Cystocele   . Depression   . Diabetes mellitus   . Dysfunction of eustachian tube   . Fatty liver   . Fissure, anal   . Genital herpes   . GERD (gastroesophageal reflux disease)   . Hemorrhoid   . Hx of migraine headaches   . Hyperlipidemia   . Hypertension   . Insomnia   . Iron deficiency anemia, unspecified   . Obesity   . OSA on CPAP   . Rectocele     Past Surgical History:  Procedure Laterality Date  . ABDOMINAL HYSTERECTOMY  11/11/2010   Senaida Ores.  Ovaries intact.  Uterine fibroids with DUB.  [name] CARPAL TUNNEL RELEASE  1989   Left  . CHOLECYSTECTOMY    . CHOLECYSTECTOMY    . COLONOSCOPY  06/29/2012   normal.  Repeat in 5 years.  [name] ECTOPIC PREGNANCY SURGERY  1993  . ESOPHAGOGASTRODUODENOSCOPY  12/13/2011   dysphagia.  Marina Goodell.  Normal.  . Sleep study  09/11/2012   severe OSA.  CPAP titration at 12 cm water pressure.  . TUBAL LIGATION  1988    There were no vitals filed for this visit.      Subjective Assessment - 12/10/16 1625    Subjective I had the ablation last week. They said it will take 3-4 weeks to feel improvement. Less left radicular pain since the procedure.    Currently in Pain? Yes   Pain Score 5    Pain Location Back   Pain Orientation Left    Pain Descriptors / Indicators Aching   Pain Radiating Towards intermittent into left leg and thigh    Pain Frequency Constant            [facility] PT Assessment - 12/10/16 0001      ROM / Strength   AROM / PROM / Strength Strength     Strength   Overall Strength Comments WNL in LE   Strength Assessment Site --   Right/Left Hip --                     [facility] Adult PT Treatment/Exercise - 12/10/16 0001      Self-Care   Self-Care Other Self-Care Comments   Other Self-Care Comments  Tennis ball self trigger point release      Lumbar Exercises: Stretches   Lower Trunk Rotation 2 reps;20 seconds  RT/LT    ITB Stretch 3 reps;30 seconds     Lumbar Exercises: Supine   Clam 20 reps   Clam Limitations green band    Bridge Limitations shoulder bridge x 15  added green band clams     Knee/Hip Exercises: Aerobic   Nustep L5  UE and LE   6 min     Iontophoresis   Type of Iontophoresis Dexamethasone   Location Bilateral hips   Dose 1 Ml x 2    Time 6 hours     Manual Therapy   Soft tissue mobilization massage roller to bilateral lateral hip and ITB   tennis ball trigger point release                PT Education - 12/10/16 1723    Education provided Yes   Education Details Self Trigger Point release with tennis ball   Person(s) Educated Patient   Methods Explanation;Handout   Comprehension Verbalized understanding          PT Short Term Goals - 12/10/16 1724      PT SHORT TERM GOAL #1   Title She will be independent with intial HEP   Status Achieved     PT SHORT TERM GOAL #2   Title she will report decr pain and be  able to stand 10 min or more before inc pain   Baseline Severe pain with washing dishes   Time 3   Period Weeks   Status On-going     PT SHORT TERM GOAL #3   Title she will report pain decr so she is able to walk 10 min or more before incr pain.    Baseline mostly limited by hip pain, not back pain   Time 3   Period Weeks    Status On-going           PT Long Term Goals - 11/12/16 1721      PT LONG TERM GOAL #1   Title she will be independent with all hEP issued    Time 6   Period Weeks   Status New     PT LONG TERM GOAL #2   Title She willbe able to walk for > 20 min due to decr pain   Time 6   Period Weeks   Status New     PT LONG TERM GOAL #3   Title she will report pain as intermittant   Time 6   Period Weeks   Status New     PT LONG TERM GOAL #4   Title She will be able to sit for 45 min before incr pain.    Time 6   Period Weeks   Status New               Plan - 12/10/16 1629    Clinical Impression Statement Pt reports she performs HEP fairly consistently. Her hip strength is normal. She demonstrates trigger points throughout gluteals/piriformis bilateral with left worse than right. She reports manual was helpful last visit and she felt less intensity of hip pain for the rest of the day but the pain returned the next day. instructed pt in self tennis ball massage and triger point release. Issued tennis ball. Repeated iontophoresis as pt thought this was beneficial. STG# 1 Met. Minimal progress toward other goals.    PT Next Visit Plan Tennis ball helful for trigger point release. Add to flexion /stabilization exercise  with HEP , modalities as needed , ionto lateral LT hip, STW to lateral LT thigh and lower back as needed, repeat ionto, ?consider TPDN?   PT Home Exercise Plan PPT and single knee to chest, side lye clam.  shoulder bridge, scissors     Consulted and Agree with Plan of Care Patient  Patient will benefit from skilled therapeutic intervention in order to improve the following deficits and impairments:  Postural dysfunction, Pain, Decreased strength, Decreased activity tolerance, Increased muscle spasms, Decreased range of motion, Obesity  Visit Diagnosis: Chronic left-sided low back pain, with sciatica presence unspecified  Abnormal posture  Muscle spasm of  back  Weakness of trunk musculature     Problem List Patient Active Problem List   Diagnosis Date Noted  . Left shoulder pain 06/02/2015  . Insomnia 05/12/2014  . Fibromyalgia 05/12/2014  . Allergic rhinitis 03/28/2014  . Osteoarthritis 12/20/2013  . Cervical strain 06/21/2013  . Low back pain 06/21/2013  . Metatarsalgia of both feet 03/11/2013  . Chest pain 01/25/2013  . Sciatica 01/25/2013  . Restless leg syndrome 11/24/2012  . Pure hypercholesterolemia 10/20/2012  . OSA on CPAP 10/20/2012  . Depression 10/20/2012  . Iron deficiency anemia, unspecified 10/01/2012  . Diabetes mellitus, type 2 ([place]) 12/31/2010  . ANAL FISSURE 12/31/2010  . Morbid obesity ([place]) 01/08/2007  . CONSTIPATION 01/08/2007    Dorene Ar, PTA 12/10/2016, 5:25 PM  [place], [place], [address] Phone: [phone]   Fax:  [phone]  Name: Cristina [name] MRN: [id number] Date of Birth: 12-27-1960  PHYSICAL THERAPY DISCHARGE SUMMARY  Visits from Start of Care:   Current functional level related to goals / functional outcomes: Unknown . Her attendance was not consistent and she was called and she reported illness and she was reminded about next PT appointment and she has not been to PT sionce this visit so will discharge   Remaining deficits: Unknown   Education / Equipment: HEP Plan:                                                    Patient goals were not met. Patient is being discharged due to not returning since the last visit.  ?????    Noralee Stain PT  01/14/17    1:39 PM

## 2016-12-12 ENCOUNTER — Ambulatory Visit (INDEPENDENT_AMBULATORY_CARE_PROVIDER_SITE_OTHER): Payer: 59 | Admitting: Family Medicine

## 2016-12-12 ENCOUNTER — Ambulatory Visit: Payer: 59

## 2016-12-12 ENCOUNTER — Telehealth: Payer: Self-pay

## 2016-12-12 ENCOUNTER — Ambulatory Visit (INDEPENDENT_AMBULATORY_CARE_PROVIDER_SITE_OTHER): Payer: 59

## 2016-12-12 VITALS — BP 126/80 | HR 110 | Temp 102.2°F | Resp 16 | Ht 62.0 in | Wt 226.0 lb

## 2016-12-12 DIAGNOSIS — J111 Influenza due to unidentified influenza virus with other respiratory manifestations: Secondary | ICD-10-CM

## 2016-12-12 DIAGNOSIS — E1142 Type 2 diabetes mellitus with diabetic polyneuropathy: Secondary | ICD-10-CM | POA: Diagnosis not present

## 2016-12-12 DIAGNOSIS — J4521 Mild intermittent asthma with (acute) exacerbation: Secondary | ICD-10-CM | POA: Diagnosis not present

## 2016-12-12 DIAGNOSIS — D72820 Lymphocytosis (symptomatic): Secondary | ICD-10-CM

## 2016-12-12 DIAGNOSIS — R05 Cough: Secondary | ICD-10-CM | POA: Diagnosis not present

## 2016-12-12 LAB — POCT CBC
Granulocyte percent: 75.7 %G (ref 37–80)
HEMATOCRIT: 40.6 % (ref 37.7–47.9)
HEMOGLOBIN: 13.7 g/dL (ref 12.2–16.2)
Lymph, poc: 1.6 (ref 0.6–3.4)
MCH: 28.3 pg (ref 27–31.2)
MCHC: 33.9 g/dL (ref 31.8–35.4)
MCV: 83.5 fL (ref 80–97)
MID (cbc): 0.3 (ref 0–0.9)
MPV: 7.7 fL (ref 0–99.8)
POC GRANULOCYTE: 5.8 (ref 2–6.9)
POC LYMPH PERCENT: 20.5 %L (ref 10–50)
POC MID %: 3.8 % (ref 0–12)
Platelet Count, POC: 263 10*3/uL (ref 142–424)
RBC: 4.86 M/uL (ref 4.04–5.48)
RDW, POC: 16 %
WBC: 7.7 10*3/uL (ref 4.6–10.2)

## 2016-12-12 LAB — GLUCOSE, POCT (MANUAL RESULT ENTRY): POC Glucose: 104 mg/dl — AB (ref 70–99)

## 2016-12-12 MED ORDER — OSELTAMIVIR PHOSPHATE 75 MG PO CAPS: 75.0000 mg | ORAL_CAPSULE | Freq: Two times a day (BID) | ORAL | 0 refills | Status: DC

## 2016-12-12 MED FILL — OSELTAMIVIR PHOS 75 MG CAP: 75 | 5 days supply | Qty: 10 | Fill #0

## 2016-12-12 NOTE — Progress Notes (Signed)
Subjective:    Patient ID: Cristina [name], female    DOB: 1961/06/26, 56 y.o.   MRN: [id number]  12/12/2016  Fever (X 2-3 DAYS ); Cough; Sore Throat; and Headache   HPI This 56 y.o. female presents for evaluation of fever Tmax 102, chills/sweats, body aches but hurts all the time, horrible headache, cough.  +L sided ear pain.  Mild rhinorrhea; mild nasal congestion. +significant cough; mild mucous.  +ST.  No n/v.  + SOB mild.  +mild wheezing; s/p flu vaccine.  Taking   WBC count elevated again at last visit yet no acute illness.  Has had elevated WBC in past several visits yet usually has acute illness occurring. At recent visit this month, no acute illness or UTI symptoms.     Review of Systems  Constitutional: Positive for chills, diaphoresis, fatigue and fever.  HENT: Positive for congestion, ear pain, rhinorrhea and sore throat. Negative for postnasal drip, sinus pressure and trouble swallowing.   Respiratory: Positive for cough and wheezing. Negative for shortness of breath.   Cardiovascular: Negative for chest pain, palpitations and leg swelling.  Gastrointestinal: Negative for abdominal pain, constipation, diarrhea, nausea and vomiting.  Endocrine: Negative for cold intolerance, heat intolerance, polydipsia, polyphagia and polyuria.  Neurological: Positive for headaches.    Past Medical History:  Diagnosis Date  . Allergic rhinitis, cause unspecified   . Arthritis   . Constipation   . CTS (carpal tunnel syndrome)   . Cystocele   . Depression   . Diabetes mellitus   . Dysfunction of eustachian tube   . Fatty liver   . Fissure, anal   . Genital herpes   . GERD (gastroesophageal reflux disease)   . Hemorrhoid   . Hx of migraine headaches   . Hyperlipidemia   . Hypertension   . Insomnia   . Iron deficiency anemia, unspecified   . Obesity   . OSA on CPAP   . Rectocele    Past Surgical History:  Procedure Laterality Date  . ABDOMINAL HYSTERECTOMY  11/11/2010   [place].  Ovaries intact.  Uterine fibroids with DUB.  [name] CARPAL TUNNEL RELEASE  1989   Left  . CHOLECYSTECTOMY    . CHOLECYSTECTOMY    . COLONOSCOPY  06/29/2012   normal.  Repeat in 5 years.  [name] [place]  . ESOPHAGOGASTRODUODENOSCOPY  12/13/2011   dysphagia.  Henrene Pastor.  Normal.  . Sleep study  09/11/2012   severe OSA.  CPAP titration at 12 cm water pressure.  . TUBAL LIGATION  1988   Allergies  Allergen Reactions  . Fish Oil Rash    Social History   Social History  . Marital status: Single    Spouse name: n/a  . Number of children: 3  . Years of education: college   Occupational History  . [employer]   Social History Main Topics  . Smoking status: Former Research scientist (life sciences)  . Smokeless tobacco: Never Used     Comment: smoked occasionally longest 6 mos.  . Alcohol use No  . Drug use: No  . Sexual activity: No   Other Topics Concern  . Not on file   Social History Narrative      Marital status:  Divorced in 2000 after six years of marriage; not dating in 2017.      Children:  3 daughters (38, 75, 75); 6 grandchildren.      Lives: alone.  2 dogs;  grandson stays some.      Employment:  Working at [employer]. 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      Tobacco: none      Alcohol: socially; special occasions.  Rare.      Drugs: none      Exercise:  Sporadic; stationary bike.      Sexual activity:  Not sexually active since 2011.       Guns:  No guns in the home.       Smoke detectors in use,       Seatbelt:  Sometimes uses seat belts. 75% of time.  No texting while driving.     Family History  Problem Relation Age of Onset  . Hypertension Mother 57  . Osteoarthritis Mother   . Diabetes Mother   . Colon cancer Mother 50  . Cancer Mother 57    colon cancer  . Colon cancer Paternal Grandmother   . Cancer Paternal Grandmother   . Stroke Maternal Grandmother   . Colon polyps Brother     x 2 brother  .  Diabetes Daughter   . Hypertension Daughter   . Sleep apnea Daughter   . Mental illness Daughter     depression  . Migraines Daughter   . Colon polyps Brother   . Cirrhosis Brother     alcoholism  . Alcohol abuse Brother   . Migraines Daughter   . Mental illness Daughter     anxiety attacks  . Protein S deficiency Daughter   . Sleep apnea Brother   . Hyperlipidemia Brother   . Colon cancer Paternal Aunt        Objective:    BP 126/80 (BP Location: Left Arm, Patient Position: Sitting, Cuff Size: Large)   Pulse (!) 110   Temp (!) 102.2 F (39 C) (Oral)   Resp 16   Ht 5\' 2"  (1.575 m)   Wt 226 lb (102.5 kg)   SpO2 93%   BMI 41.34 kg/m  Physical Exam  Constitutional: She is oriented to person, place, and time. She appears well-developed and well-nourished. She appears ill. No distress.  HENT:  Head: Normocephalic and atraumatic.  Right Ear: External ear normal.  Left Ear: External ear normal.  Nose: Nose normal.  Mouth/Throat: Oropharynx is clear and moist.  Eyes: Conjunctivae and EOM are normal. Pupils are equal, round, and reactive to light.  Neck: Normal range of motion. Neck supple. Carotid bruit is not present. No thyromegaly present.  Cardiovascular: Normal rate, regular rhythm, normal heart sounds and intact distal pulses.  Exam reveals no gallop and no friction rub.   No murmur heard. Pulmonary/Chest: Effort normal and breath sounds normal. She has no wheezes. She has no rales.  Abdominal: Soft. Bowel sounds are normal. She exhibits no distension and no mass. There is no tenderness. There is no rebound and no guarding.  Lymphadenopathy:    She has no cervical adenopathy.  Neurological: She is alert and oriented to person, place, and time. No cranial nerve deficit.  Skin: Skin is warm and dry. No rash noted. She is not diaphoretic. No erythema. No pallor.  Psychiatric: She has a normal mood and affect. Her behavior is normal.   Results for orders placed or  performed in visit on 12/12/16  POCT CBC  Result Value Ref Range   WBC 7.7 4.6 - 10.2 K/uL   Lymph, poc 1.6 0.6 - 3.4   POC LYMPH PERCENT 20.5 10 - 50 %L   MID (cbc)  0.3 0 - 0.9   POC MID % 3.8 0 - 12 %M   POC Granulocyte 5.8 2 - 6.9   Granulocyte percent 75.7 37 - 80 %G   RBC 4.86 4.04 - 5.48 M/uL   Hemoglobin 13.7 12.2 - 16.2 g/dL   HCT, POC 40.6 37.7 - 47.9 %   MCV 83.5 80 - 97 fL   MCH, POC 28.3 27 - 31.2 pg   MCHC 33.9 31.8 - 35.4 g/dL   RDW, POC 16.0 %   Platelet Count, POC 263 142 - 424 K/uL   MPV 7.7 0 - 99.8 fL  POCT glucose (manual entry)  Result Value Ref Range   POC Glucose 104 (A) 70 - 99 mg/dl   Dg Chest 2 View  Result Date: 12/12/2016 CLINICAL DATA:  Fever, cough, asthma EXAM: CHEST  2 VIEW COMPARISON:  10/30/2016 FINDINGS: The heart size and mediastinal contours are within normal limits. Both lungs are clear. The visualized skeletal structures are unremarkable. IMPRESSION: No active cardiopulmonary disease. Electronically Signed   By: Kathreen Devoid   On: 12/12/2016 11:48       Assessment & Plan:   1. Influenza   2. Type 2 diabetes mellitus with diabetic polyneuropathy, without long-term current use of insulin ([place])   3. Mild intermittent asthma with acute exacerbation   4. Lymphocytosis    -acute influenza; due to diabetes and asthma and obesity, high risk for complications.  RTC immediately for acute decline.  Treat supportively with rest, fluids, Tylenol, and Mucinex DM. -monitor sugars closely with acute illness. -refer to hematology due to elevated WBC for several months.  WBC lower today and suspect due to acute viral illness/influenza.   Orders Placed This Encounter  Procedures  . DG Chest 2 View    Standing Status:   Future    Number of Occurrences:   1    Standing Expiration Date:   12/12/2017    Order Specific Question:   Reason for Exam (SYMPTOM  OR DIAGNOSIS REQUIRED)    Answer:   fever, cough, asthma    Order Specific Question:   Is the patient  pregnant?    Answer:   No    Comments:   hysterectomy    Order Specific Question:   Preferred imaging location?    Answer:   External  . Ambulatory referral to Hematology    Referral Priority:   Routine    Referral Type:   Consultation    Referral Reason:   Specialty Services Required    Requested Specialty:   Oncology    Number of Visits Requested:   1  . POCT CBC  . POCT glucose (manual entry)   Meds ordered this encounter  Medications  . oseltamivir (TAMIFLU) 75 MG capsule    Sig: Take 1 capsule (75 mg total) by mouth 2 (two) times daily.    Dispense:  10 capsule    Refill:  0    No Follow-up on file.   Kmya Placide Elayne Guerin, M.D. Primary Care at [facility] [address] [place], [place]  [id number] [phone] phone [phone] fax

## 2016-12-12 NOTE — Telephone Encounter (Signed)
Spoke to Cristina [name] and she reports having the flu and forgetting to call and cancel. She was reminded of appointment next week 12/17/16 4:30

## 2016-12-12 NOTE — Patient Instructions (Addendum)
Take Tylenol every six hours. Take Albuterol every sixhours. Take Mucinex DM for cough.     IF you received an x-ray today, you will receive an invoice from [facility]. Please contact [facility] at [phone] with questions or concerns regarding your invoice.   IF you received labwork today, you will receive an invoice from [facility]. Please contact [facility] at [phone] with questions or concerns regarding your invoice.   Our billing staff will not be able to assist you with questions regarding bills from these companies.  You will be contacted with the lab results as soon as they are available. The fastest way to get your results is to activate your My Chart account. Instructions are located on the last page of this paperwork. If you have not heard from [name] regarding the results in 2 weeks, please contact this office.      Influenza, Adult Influenza, more commonly known as "the flu," is a viral infection that primarily affects the respiratory tract. The respiratory tract includes organs that help you breathe, such as the lungs, nose, and throat. The flu causes many common cold symptoms, as well as a high fever and body aches. The flu spreads easily from person to person (is contagious). Getting a flu shot (influenza vaccination) every year is the best way to prevent influenza. What are the causes? Influenza is caused by a virus. You can catch the virus by:  Breathing in droplets from an infected person's cough or sneeze.  Touching something that was recently contaminated with the virus and then touching your mouth, nose, or eyes. What increases the risk? The following factors may make you more likely to get the flu:  Not cleaning your hands frequently with soap and water or alcohol-based hand sanitizer.  Having close contact with many people during cold and flu season.  Touching your mouth, eyes, or nose without washing or sanitizing your hands  first.  Not drinking enough fluids or not eating a healthy diet.  Not getting enough sleep or exercise.  Being under a high amount of stress.  Not getting a yearly (annual) flu shot. You may be at a higher risk of complications from the flu, such as a severe lung infection (pneumonia), if you:  Are over the age of 10.  Are pregnant.  Have a weakened disease-fighting system (immune system). You may have a weakened immune system if you:  Have HIV or AIDS.  Are undergoing chemotherapy.  Aretaking medicines that reduce the activity of (suppress) the immune system.  Have a long-term (chronic) illness, such as heart disease, kidney disease, diabetes, or lung disease.  Have a liver disorder.  Are obese.  Have anemia. What are the signs or symptoms? Symptoms of this condition typically last 4-10 days and may include:  Fever.  Chills.  Headache, body aches, or muscle aches.  Sore throat.  Cough.  Runny or congested nose.  Chest discomfort and cough.  Poor appetite.  Weakness or tiredness (fatigue).  Dizziness.  Nausea or vomiting. How is this diagnosed? This condition may be diagnosed based on your medical history and a physical exam. Your health care provider may do a nose or throat swab test to confirm the diagnosis. How is this treated? If influenza is detected early, you can be treated with antiviral medicine that can reduce the length of your illness and the severity of your symptoms. This medicine may be given by mouth (orally) or through an IV tube that is inserted in one of your  veins. The goal of treatment is to relieve symptoms by taking care of yourself at home. This may include taking over-the-counter medicines, drinking plenty of fluids, and adding humidity to the air in your home. In some cases, influenza goes away on its own. Severe influenza or complications from influenza may be treated in a hospital. Follow these instructions at home:  Take  over-the-counter and prescription medicines only as told by your health care provider.  Use a cool mist humidifier to add humidity to the air in your home. This can make breathing easier.  Rest as needed.  Drink enough fluid to keep your urine clear or pale yellow.  Cover your mouth and nose when you cough or sneeze.  Wash your hands with soap and water often, especially after you cough or sneeze. If soap and water are not available, use hand sanitizer.  Stay home from work or school as told by your health care provider. Unless you are visiting your health care provider, try to avoid leaving home until your fever has been gone for 24 hours without the use of medicine.  Keep all follow-up visits as told by your health care provider. This is important. How is this prevented?  Getting an annual flu shot is the best way to avoid getting the flu. You may get the flu shot in late summer, fall, or winter. Ask your health care provider when you should get your flu shot.  Wash your hands often or use hand sanitizer often.  Avoid contact with people who are sick during cold and flu season.  Eat a healthy diet, drink plenty of fluids, get enough sleep, and exercise regularly. Contact a health care provider if:  You develop new symptoms.  You have:  Chest pain.  Diarrhea.  A fever.  Your cough gets worse.  You produce more mucus.  You feel nauseous or you vomit. Get help right away if:  You develop shortness of breath or difficulty breathing.  Your skin or nails turn a bluish color.  You have severe pain or stiffness in your neck.  You develop a sudden headache or sudden pain in your face or ear.  You cannot stop vomiting. This information is not intended to replace advice given to you by your health care provider. Make sure you discuss any questions you have with your health care provider. Document Released: 10/25/2000 Document Revised: 04/04/2016 Document Reviewed:  08/22/2015 Elsevier Interactive Patient Education  2017 [employer].

## 2016-12-17 ENCOUNTER — Ambulatory Visit: Payer: 59 | Admitting: Physical Therapy

## 2016-12-18 ENCOUNTER — Encounter: Payer: Self-pay | Admitting: Family Medicine

## 2016-12-18 ENCOUNTER — Ambulatory Visit (INDEPENDENT_AMBULATORY_CARE_PROVIDER_SITE_OTHER): Payer: 59 | Admitting: Family Medicine

## 2016-12-18 VITALS — BP 132/79 | HR 97 | Temp 98.2°F | Resp 16 | Ht 62.0 in | Wt 225.0 lb

## 2016-12-18 DIAGNOSIS — E1142 Type 2 diabetes mellitus with diabetic polyneuropathy: Secondary | ICD-10-CM | POA: Diagnosis not present

## 2016-12-18 DIAGNOSIS — J111 Influenza due to unidentified influenza virus with other respiratory manifestations: Secondary | ICD-10-CM

## 2016-12-18 DIAGNOSIS — J4521 Mild intermittent asthma with (acute) exacerbation: Secondary | ICD-10-CM | POA: Diagnosis not present

## 2016-12-18 MED ORDER — FLUCONAZOLE 150 MG PO TABS: 150.0000 mg | ORAL_TABLET | Freq: Once | ORAL | 0 refills | Status: AC

## 2016-12-18 MED ORDER — PREDNISONE 20 MG PO TABS: ORAL_TABLET | ORAL | 0 refills | Status: DC

## 2016-12-18 MED ORDER — AMOXICILLIN 500 MG PO TABS: 1000.0000 mg | ORAL_TABLET | Freq: Two times a day (BID) | ORAL | 0 refills | Status: DC

## 2016-12-18 MED FILL — VICTOZA 18 MG/3 ML INJECT P: 18 | 30 days supply | Qty: 9 | Fill #0

## 2016-12-18 MED FILL — predniSONE 20 MG TABS: 20 | 10 days supply | Qty: 15 | Fill #0

## 2016-12-18 NOTE — Patient Instructions (Addendum)
1. Start Prednisone. 2.  Start nasal saline spray into nose twice daily. 3.  Start Tessalon Perles for cough. 4.  You can stop Mucinex D.    IF you received an x-ray today, you will receive an invoice from [facility]. Please contact [facility] at [phone] with questions or concerns regarding your invoice.   IF you received labwork today, you will receive an invoice from [facility]. Please contact [facility] at [phone] with questions or concerns regarding your invoice.   Our billing staff will not be able to assist you with questions regarding bills from these companies.  You will be contacted with the lab results as soon as they are available. The fastest way to get your results is to activate your My Chart account. Instructions are located on the last page of this paperwork. If you have not heard from [name] regarding the results in 2 weeks, please contact this office.        Influenza, Adult Influenza ("the flu") is an infection in the lungs, nose, and throat (respiratory tract). It is caused by a virus. The flu causes many common cold symptoms, as well as a high fever and body aches. It can make you feel very sick. The flu spreads easily from person to person (is contagious). Getting a flu shot (influenza vaccination) every year is the best way to prevent the flu. Follow these instructions at home:  Take over-the-counter and prescription medicines only as told by your doctor.  Use a cool mist humidifier to add moisture (humidity) to the air in your home. This can make it easier to breathe.  Rest as needed.  Drink enough fluid to keep your pee (urine) clear or pale yellow.  Cover your mouth and nose when you cough or sneeze.  Wash your hands with soap and water often, especially after you cough or sneeze. If you cannot use soap and water, use hand sanitizer.  Stay home from work or school as told by your doctor. Unless you are visiting your doctor, try to  avoid leaving home until your fever has been gone for 24 hours without the use of medicine.  Keep all follow-up visits as told by your doctor. This is important. How is this prevented?  Getting a yearly (annual) flu shot is the best way to avoid getting the flu. You may get the flu shot in late summer, fall, or winter. Ask your doctor when you should get your flu shot.  Wash your hands often or use hand sanitizer often.  Avoid contact with people who are sick during cold and flu season.  Eat healthy foods.  Drink plenty of fluids.  Get enough sleep.  Exercise regularly. Contact a doctor if:  You get new symptoms.  You have:  Chest pain.  Watery poop (diarrhea).  A fever.  Your cough gets worse.  You start to have more mucus.  You feel sick to your stomach (nauseous).  You throw up (vomit). Get help right away if:  You start to be short of breath or have trouble breathing.  Your skin or nails turn a bluish color.  You have very bad pain or stiffness in your neck.  You get a sudden headache.  You get sudden pain in your face or ear.  You cannot stop throwing up. This information is not intended to replace advice given to you by your health care provider. Make sure you discuss any questions you have with your health care provider. Document Released: 08/06/2008 Document  Revised: 04/04/2016 Document Reviewed: 08/22/2015 Elsevier Interactive Patient Education  2017 [employer].

## 2016-12-18 NOTE — Progress Notes (Signed)
Subjective:    Patient ID: Cristina [name], female    DOB: 1961-04-02, 56 y.o.   MRN: [id number]  12/18/2016  Follow-up (cough, left ear pain, right sinus pressure/pain feels like it's burning)   HPI This 56 y.o. female presents for evaluation of persistent symptoms from acute influenza infection.  Treated on 12/12/16 for acute influenza infection with Tamiflu; CXR negative for infiltrate.  Last fever one week ago.  Kept taking Tylenol.  Still having headache; still moderate to severe.  L ear is hurting; continues to hurt since 12/12/16.  Sore throat is due to coughing and raw.  +rhinorrhea; +nasal congestion; +face is hurting onset yesterday.  Coughing horrible; worse than last week.  Still wheezing; not sure in chest or head.  Finished Tamiflu.  Taking nothing else.  Taking Albuterol every 4 hours.  Has not used Albuterol today. Using Astelin and Flonase; still taking Xyzal.  Still taking Mucinex D every 12 hours.    Sugar was 120 yesterday.  Nose is burning.  Review of Systems  Constitutional: Negative for chills, diaphoresis, fatigue and fever.  HENT: Positive for congestion, ear pain, postnasal drip, rhinorrhea, sinus pain, sinus pressure and voice change. Negative for ear discharge, hearing loss, sore throat and trouble swallowing.   Eyes: Negative for visual disturbance.  Respiratory: Positive for cough and wheezing. Negative for shortness of breath.   Cardiovascular: Negative for chest pain, palpitations and leg swelling.  Gastrointestinal: Negative for abdominal pain, constipation, diarrhea, nausea and vomiting.  Endocrine: Negative for cold intolerance, heat intolerance, polydipsia, polyphagia and polyuria.  Neurological: Negative for dizziness, tremors, seizures, syncope, facial asymmetry, speech difficulty, weakness, light-headedness, numbness and headaches.    Past Medical History:  Diagnosis Date  . Allergic rhinitis, cause unspecified   . Arthritis   . Constipation   . CTS  (carpal tunnel syndrome)   . Cystocele   . Depression   . Diabetes mellitus   . Dysfunction of eustachian tube   . Fatty liver   . Fissure, anal   . Genital herpes   . GERD (gastroesophageal reflux disease)   . Hemorrhoid   . Hx of migraine headaches   . Hyperlipidemia   . Hypertension   . Insomnia   . Iron deficiency anemia, unspecified   . Obesity   . OSA on CPAP   . Rectocele    Past Surgical History:  Procedure Laterality Date  . ABDOMINAL HYSTERECTOMY  11/11/2010   [place] Plan.  Ovaries intact.  Uterine fibroids with DUB.  [name] CARPAL TUNNEL RELEASE  1989   Left  . CHOLECYSTECTOMY    . CHOLECYSTECTOMY    . COLONOSCOPY  06/29/2012   normal.  Repeat in 5 years.  [name] [place]  . ESOPHAGOGASTRODUODENOSCOPY  12/13/2011   dysphagia.  Henrene Pastor.  Normal.  . Sleep study  09/11/2012   severe OSA.  CPAP titration at 12 cm water pressure.  . TUBAL LIGATION  1988   Allergies  Allergen Reactions  . Fish Oil Rash    Social History   Social History  . Marital status: Single    Spouse name: n/a  . Number of children: 3  . Years of education: college   Occupational History  . [place]   Social History Main Topics  . Smoking status: Former Research scientist (life sciences)  . Smokeless tobacco: Never Used     Comment: smoked occasionally longest 6 mos.  . Alcohol use No  .  Drug use: No  . Sexual activity: No   Other Topics Concern  . Not on file   Social History Narrative      Marital status:  Divorced in 2000 after six years of marriage; not dating in 2017.      Children:  3 daughters (38, 24, 81); 6 grandchildren.      Lives: alone.  2 dogs; grandson stays some.      Employment:  Working at [employer]. 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      Tobacco: none      Alcohol: socially; special occasions.  Rare.      Drugs: none      Exercise:  Sporadic; stationary bike.      Sexual activity:  Not sexually active since 2011.        Guns:  No guns in the home.       Smoke detectors in use,       Seatbelt:  Sometimes uses seat belts. 75% of time.  No texting while driving.     Family History  Problem Relation Age of Onset  . Hypertension Mother 81  . Osteoarthritis Mother   . Diabetes Mother   . Colon cancer Mother 60  . Cancer Mother 51    colon cancer  . Colon cancer Paternal Grandmother   . Cancer Paternal Grandmother   . Stroke Maternal Grandmother   . Colon polyps Brother     x 2 brother  . Diabetes Daughter   . Hypertension Daughter   . Sleep apnea Daughter   . Mental illness Daughter     depression  . Migraines Daughter   . Colon polyps Brother   . Cirrhosis Brother     alcoholism  . Alcohol abuse Brother   . Migraines Daughter   . Mental illness Daughter     anxiety attacks  . Protein S deficiency Daughter   . Sleep apnea Brother   . Hyperlipidemia Brother   . Colon cancer Paternal Aunt        Objective:    BP 132/79 (BP Location: Right Arm, Patient Position: Sitting, Cuff Size: Small)   Pulse 97   Temp 98.2 F (36.8 C) (Oral)   Resp 16   Ht 5\' 2"  (1.575 m)   Wt 225 lb (102.1 kg)   SpO2 95%   BMI 41.15 kg/m  Physical Exam  Constitutional: She is oriented to person, place, and time. She appears well-developed and well-nourished. No distress.  HENT:  Head: Normocephalic and atraumatic.  Right Ear: External ear and ear canal normal. Tympanic membrane is retracted.  Left Ear: External ear and ear canal normal. Tympanic membrane is retracted.  Nose: Right sinus exhibits no maxillary sinus tenderness and no frontal sinus tenderness. Left sinus exhibits maxillary sinus tenderness. Left sinus exhibits no frontal sinus tenderness.  Mouth/Throat: Uvula is midline, oropharynx is clear and moist and mucous membranes are normal.  Eyes: Conjunctivae and EOM are normal. Pupils are equal, round, and reactive to light.  Neck: Normal range of motion. Neck supple. Carotid bruit is not present. No  thyromegaly present.  Cardiovascular: Normal rate, regular rhythm, normal heart sounds and intact distal pulses.  Exam reveals no gallop and no friction rub.   No murmur heard. Pulmonary/Chest: Effort normal and breath sounds normal. She has no wheezes. She has no rales.  Lymphadenopathy:    She has no cervical adenopathy.  Neurological: She is alert and oriented to person, place, and time. No cranial nerve  deficit.  Skin: Skin is warm and dry. No rash noted. She is not diaphoretic. No erythema. No pallor.  Psychiatric: She has a normal mood and affect. Her behavior is normal.   Results for orders placed or performed in visit on 12/12/16  POCT CBC  Result Value Ref Range   WBC 7.7 4.6 - 10.2 K/uL   Lymph, poc 1.6 0.6 - 3.4   POC LYMPH PERCENT 20.5 10 - 50 %L   MID (cbc) 0.3 0 - 0.9   POC MID % 3.8 0 - 12 %M   POC Granulocyte 5.8 2 - 6.9   Granulocyte percent 75.7 37 - 80 %G   RBC 4.86 4.04 - 5.48 M/uL   Hemoglobin 13.7 12.2 - 16.2 g/dL   HCT, POC 40.6 37.7 - 47.9 %   MCV 83.5 80 - 97 fL   MCH, POC 28.3 27 - 31.2 pg   MCHC 33.9 31.8 - 35.4 g/dL   RDW, POC 16.0 %   Platelet Count, POC 263 142 - 424 K/uL   MPV 7.7 0 - 99.8 fL  POCT glucose (manual entry)  Result Value Ref Range   POC Glucose 104 (A) 70 - 99 mg/dl       Assessment & Plan:   1. Influenza   2. Mild intermittent asthma with acute exacerbation   3. Type 2 diabetes mellitus with diabetic polyneuropathy, without long-term current use of insulin ([place])   4. Morbid obesity ([employer])    -influenza improving yet suffering with ongoing nasal congestion and cough. -rx for Prednisone provided. -stop Mucinex D. -start nasal saline spray twice daily; start Tessalon Perles for cough; pt has remaining rx at home. -continue Albuterol qid scheduled for one week. -if no improvement in symptoms in 3-5 days, start Amoxicillin for secondary sinusitis.   No orders of the defined types were placed in this encounter.  Meds ordered  this encounter  Medications  . predniSONE (DELTASONE) 20 MG tablet    Sig: Two tablets daily x 5 days then one tablet daily x 5 days    Dispense:  15 tablet    Refill:  0  . amoxicillin (AMOXIL) 500 MG tablet    Sig: Take 2 tablets (1,000 mg total) by mouth 2 (two) times daily.    Dispense:  40 tablet    Refill:  0  . fluconazole (DIFLUCAN) 150 MG tablet    Sig: Take 1 tablet (150 mg total) by mouth once. Repeat if needed    Dispense:  2 tablet    Refill:  0    No Follow-up on file.   Assunta Pupo Elayne Guerin, M.D. Primary Care at [facility] [address] [place], [place]  [address] [phone] phone [phone] fax

## 2016-12-30 ENCOUNTER — Telehealth: Payer: Self-pay

## 2016-12-30 NOTE — Telephone Encounter (Signed)
Pt is calling states dr. Tamala Julian wrote her a presrciption 2 weeks ago and she has misplaced it and would like them to be called in unsure of the medication    Please advise [phone]

## 2016-12-31 ENCOUNTER — Encounter: Payer: Self-pay | Admitting: Hematology and Oncology

## 2016-12-31 ENCOUNTER — Other Ambulatory Visit: Payer: Self-pay | Admitting: Family Medicine

## 2016-12-31 ENCOUNTER — Telehealth: Payer: Self-pay | Admitting: Hematology and Oncology

## 2016-12-31 ENCOUNTER — Encounter: Payer: Self-pay | Admitting: Family Medicine

## 2016-12-31 ENCOUNTER — Other Ambulatory Visit: Payer: Self-pay | Admitting: *Deleted

## 2016-12-31 DIAGNOSIS — Z1231 Encounter for screening mammogram for malignant neoplasm of breast: Secondary | ICD-10-CM

## 2016-12-31 DIAGNOSIS — B379 Candidiasis, unspecified: Secondary | ICD-10-CM

## 2016-12-31 MED ORDER — AMOXICILLIN 500 MG PO TABS: 1000.0000 mg | ORAL_TABLET | Freq: Two times a day (BID) | ORAL | 0 refills | Status: DC

## 2016-12-31 MED ORDER — FLUCONAZOLE 150 MG PO TABS: 150.0000 mg | ORAL_TABLET | Freq: Once | ORAL | 0 refills | Status: AC

## 2016-12-31 MED FILL — AMOXICILLIN 500 MG CAPSULE: 500 | 10 days supply | Qty: 40 | Fill #0

## 2016-12-31 MED FILL — FLUCONAZOLE 150 MG TABLET: 150 | 1 days supply | Qty: 1 | Fill #0

## 2016-12-31 NOTE — Telephone Encounter (Signed)
Appt has been scheduled for the pt to see Dr. Lindi Adie on 4/16 at 345pm. Per pt she needed an appt after 3pm. Demographics verified. Letter mailed.

## 2017-01-03 ENCOUNTER — Other Ambulatory Visit: Payer: Self-pay | Admitting: Family Medicine

## 2017-01-03 DIAGNOSIS — G4733 Obstructive sleep apnea (adult) (pediatric): Secondary | ICD-10-CM | POA: Diagnosis not present

## 2017-01-03 NOTE — Telephone Encounter (Signed)
Called to [facility]

## 2017-01-05 ENCOUNTER — Other Ambulatory Visit: Payer: Self-pay | Admitting: Family Medicine

## 2017-01-06 MED FILL — ALPRAZolam 0.5 MG TABS: 0.5 | 30 days supply | Qty: 30 | Fill #0

## 2017-01-08 NOTE — Telephone Encounter (Signed)
Denied refill. [place] Controlled Substance registry reviewed; filled on 01/06/17 Xanax approved by Ivar Drape, PA-C.  Only fill in six months.

## 2017-01-10 ENCOUNTER — Telehealth: Payer: Self-pay | Admitting: Internal Medicine

## 2017-01-10 ENCOUNTER — Encounter: Payer: Self-pay | Admitting: Gastroenterology

## 2017-01-10 NOTE — Telephone Encounter (Signed)
e

## 2017-01-13 ENCOUNTER — Ambulatory Visit (INDEPENDENT_AMBULATORY_CARE_PROVIDER_SITE_OTHER): Payer: 59 | Admitting: Specialist

## 2017-01-16 ENCOUNTER — Ambulatory Visit: Payer: 59

## 2017-01-23 MED FILL — VICTOZA 18 MG/3 ML INJECT P: 18 | 30 days supply | Qty: 9 | Fill #1

## 2017-01-23 NOTE — Telephone Encounter (Signed)
Patient needs an office visit.  It has been quite a while since she has been given Diltiazem

## 2017-01-27 MED FILL — LISINOPRIL 5 MG TABLET: 5 | 90 days supply | Qty: 90 | Fill #3

## 2017-01-27 MED FILL — rOPINIRole HCL 1 MG TABS: 1 | 90 days supply | Qty: 90 | Fill #3

## 2017-01-27 MED FILL — SERTRALINE HCL 100 MG TAB: 100 | 90 days supply | Qty: 135 | Fill #3

## 2017-01-27 MED FILL — INVOKANA 300 MG TABLET: 300 | 90 days supply | Qty: 90 | Fill #3

## 2017-01-27 MED FILL — MONTELUKAST SOD 10 MG TAB: 10 | 90 days supply | Qty: 90 | Fill #3

## 2017-01-27 MED FILL — PANTOPRAZOLE SOD DR 40 MG T: 40 | 90 days supply | Qty: 180 | Fill #1

## 2017-01-27 MED FILL — metFORMIN HCL 1000 MG TABS: 1000 | 90 days supply | Qty: 180 | Fill #3

## 2017-02-03 ENCOUNTER — Ambulatory Visit (INDEPENDENT_AMBULATORY_CARE_PROVIDER_SITE_OTHER): Payer: 59 | Admitting: Specialist

## 2017-02-03 ENCOUNTER — Encounter (INDEPENDENT_AMBULATORY_CARE_PROVIDER_SITE_OTHER): Payer: Self-pay | Admitting: Specialist

## 2017-02-03 VITALS — BP 123/77 | HR 90 | Ht 61.0 in | Wt 218.0 lb

## 2017-02-03 DIAGNOSIS — M4316 Spondylolisthesis, lumbar region: Secondary | ICD-10-CM

## 2017-02-03 DIAGNOSIS — M5136 Other intervertebral disc degeneration, lumbar region: Secondary | ICD-10-CM

## 2017-02-03 DIAGNOSIS — M47816 Spondylosis without myelopathy or radiculopathy, lumbar region: Secondary | ICD-10-CM | POA: Diagnosis not present

## 2017-02-03 DIAGNOSIS — M797 Fibromyalgia: Secondary | ICD-10-CM | POA: Diagnosis not present

## 2017-02-03 NOTE — Patient Instructions (Signed)
Avoid bending, stooping and avoid lifting weights greater than 10 lbs. Avoid prolong standing and walking. Avoid frequent bending and stooping  No lifting greater than 10 lbs. May use ice or moist heat for pain. Weight loss is of benefit. Handicap license is approved. Dr. Romona Curls secretary/Assistant will call to arrange for L1-2 and L4-5 facet injection

## 2017-02-03 NOTE — Progress Notes (Addendum)
Office Visit Note   Patient: Cristina [name]           Date of Birth: 1961/06/16           MRN: [id number] Visit Date: 02/03/2017              Requested by: [name], MD [address] [place], [place] [address] PCP: Reginia Forts, MD   Assessment & Plan: Visit Diagnoses:  1. Degenerative disc disease, lumbar   2. Spondylolisthesis, lumbar region   3. Spondylosis without myelopathy or radiculopathy, lumbar region   This 56 year old female complains of ongoing Low lumbar transverse pain and Upper lumbar transverse pain that is worsening. Underwent a left SI joint RFA with relief of pain into her left posterior leg. Now pain is back without leg involvement and is central at the lumbosacral area and upper lumbar spine. These areas correlate well with areas of known pathology, degenerative disc changes and spondylosis At the L1-2 level and degenerative disc disease and spondylolisthesis with spondylosis at the L4-5 level. Absent leg pain this is likely mechanical and while ESI did not help no attempts have been make to  Assess the facets as a cause for ongoing back pain.  Will schedule for facet blocks at the sites of the worse spondylosis changes L1-2 and L4-5.  She notes that an FLMA form filled out this past January was only for 3 months normally with chronic back conditions these are filed yearly and I recommend that her FLMA form be corrected to provide for a  Year of meaningful criteria to justify intermittant leave for a chronic spine condition that should be treated with intermittant rest and exercise and medication, only surgeries that can be contemplated involve lumbar fusion surgery which carry risk of being unable to return to gainful employment and for degenerative disc in the upper lumbar spine unfortunately not a predictable prognosis for relief of her pain.   Plan: Avoid bending, stooping and avoid lifting weights greater than 10 lbs. Avoid prolong standing and walking. Avoid  frequent bending and stooping  No lifting greater than 10 lbs. May use ice or moist heat for pain. Weight loss is of benefit. Handicap license is approved. Dr. Romona Curls secretary/Assistant will call to arrange for L1-2 and L4-5 facet injection    Follow-Up Instructions: Return in about 4 weeks (around 03/03/2017).   Orders:  No orders of the defined types were placed in this encounter.  No orders of the defined types were placed in this encounter.     Procedures: No procedures performed   Clinical Data: No additional findings.   Subjective: Chief Complaint  Patient presents with  . Lower Back - Follow-up    Cristina [name] is here to follow up after her Left side Sacroiliac Joint denervation that was done by Dr. Ernestina Patches on 12/05/2016.  She states that she was pain free till 12/25/2016.  She states that it has helped the pain in the left leg but she still has the pain in the back. Over all she has good and bad days. Pain is mainly into the back, worsened with bending and stooping and with sitting and riding in the car. owel and bladder is okay. No numbness or tingling. The left leg pain was relieved with left SI joint injection. The right anterior thigh goes numb with prolong standing and the left hip goes numb with prolong standing and walking. Coughing or sneezing without pain. Stooping and leaning on cart. Sitting somewhere helps  relieve pain    Review of Systems  Constitutional: Negative.   HENT: Negative.   Eyes: Negative.   Respiratory: Negative.   Cardiovascular: Negative.   Gastrointestinal: Negative.   Endocrine: Negative.   Genitourinary: Negative.   Musculoskeletal: Negative.   Skin: Negative.   Allergic/Immunologic: Negative.   Neurological: Negative.   Hematological: Negative.   Psychiatric/Behavioral: Negative.      Objective: Vital Signs: BP 123/77 (BP Location: Left Arm, Patient Position: Sitting)   Pulse 90   Ht 5\' 1"  (1.549 m)   Wt 218 lb (98.9 kg)    BMI 41.19 kg/m   Physical Exam  Constitutional: She is oriented to person, place, and time. She appears well-developed and well-nourished.  HENT:  Head: Normocephalic and atraumatic.  Eyes: EOM are normal. Pupils are equal, round, and reactive to light.  Neck: Normal range of motion. Neck supple.  Pulmonary/Chest: Effort normal and breath sounds normal.  Abdominal: Soft. Bowel sounds are normal.  Neurological: She is alert and oriented to person, place, and time.  Skin: Skin is warm and dry.  Psychiatric: She has a normal mood and affect. Her behavior is normal. Judgment and thought content normal.    Back Exam   Tenderness  The patient is experiencing tenderness in the lumbar.  Range of Motion  Extension: abnormal  Flexion: abnormal   Muscle Strength  Right Quadriceps:  5/5  Left Quadriceps:  5/5  Right Hamstrings:  5/5  Left Hamstrings:  5/5   Reflexes  Patellar:  Hyporeflexic normal Achilles:  Hyporeflexic normal Babinski's sign: normal   Other  Toe Walk: normal Heel Walk: normal Sensation: normal Gait: normal  Erythema: no back redness Scars: absent      Specialty Comments:  No specialty comments available.  Imaging: No results found.   PMFS History: Patient Active Problem List   Diagnosis Date Noted  . Mild intermittent asthma with acute exacerbation 12/12/2016  . Left shoulder pain 06/02/2015  . Insomnia 05/12/2014  . Fibromyalgia 05/12/2014  . Allergic rhinitis 03/28/2014  . Osteoarthritis 12/20/2013  . Cervical strain 06/21/2013  . Low back pain 06/21/2013  . Metatarsalgia of both feet 03/11/2013  . Chest pain 01/25/2013  . Sciatica 01/25/2013  . Restless leg syndrome 11/24/2012  . Pure hypercholesterolemia 10/20/2012  . OSA on CPAP 10/20/2012  . Depression 10/20/2012  . Iron deficiency anemia, unspecified 10/01/2012  . Diabetes mellitus, type 2 ([place]) 12/31/2010  . ANAL FISSURE 12/31/2010  . Morbid obesity ([place]) 01/08/2007  .  CONSTIPATION 01/08/2007   Past Medical History:  Diagnosis Date  . Allergic rhinitis, cause unspecified   . Arthritis   . Constipation   . CTS (carpal tunnel syndrome)   . Cystocele   . Depression   . Diabetes mellitus   . Dysfunction of eustachian tube   . Fatty liver   . Fissure, anal   . Genital herpes   . GERD (gastroesophageal reflux disease)   . Hemorrhoid   . Hx of migraine headaches   . Hyperlipidemia   . Hypertension   . Insomnia   . Iron deficiency anemia, unspecified   . Obesity   . OSA on CPAP   . Rectocele     Family History  Problem Relation Age of Onset  . Hypertension Mother 57  . Osteoarthritis Mother   . Diabetes Mother   . Colon cancer Mother 52  . Cancer Mother 61    colon cancer  . Colon cancer Paternal Grandmother   . Cancer Paternal  Grandmother   . Stroke Maternal Grandmother   . Colon polyps Brother     x 2 brother  . Diabetes Daughter   . Hypertension Daughter   . Sleep apnea Daughter   . Mental illness Daughter     depression  . Migraines Daughter   . Colon polyps Brother   . Cirrhosis Brother     alcoholism  . Alcohol abuse Brother   . Migraines Daughter   . Mental illness Daughter     anxiety attacks  . Protein S deficiency Daughter   . Sleep apnea Brother   . Hyperlipidemia Brother   . Colon cancer Paternal Aunt     Past Surgical History:  Procedure Laterality Date  . ABDOMINAL HYSTERECTOMY  11/11/2010   [place] Plan.  Ovaries intact.  Uterine fibroids with DUB.  [name] CARPAL TUNNEL RELEASE  1989   Left  . CHOLECYSTECTOMY    . CHOLECYSTECTOMY    . COLONOSCOPY  06/29/2012   normal.  Repeat in 5 years.  [name] [place]  . ESOPHAGOGASTRODUODENOSCOPY  12/13/2011   dysphagia.  Henrene Pastor.  Normal.  . Sleep study  09/11/2012   severe OSA.  CPAP titration at 12 cm water pressure.  . TUBAL LIGATION  1988   Social History   Occupational History  . [place]    Social History Main Topics  . Smoking status: Former Research scientist (life sciences)  . Smokeless tobacco: Never Used     Comment: smoked occasionally longest 6 mos.  . Alcohol use No  . Drug use: No  . Sexual activity: No

## 2017-02-04 ENCOUNTER — Ambulatory Visit
Admission: RE | Admit: 2017-02-04 | Discharge: 2017-02-04 | Disposition: A | Discharge status: Home / Self Care | Payer: 59 | Source: Ambulatory Visit | Attending: Family Medicine | Admitting: Family Medicine

## 2017-02-04 ENCOUNTER — Telehealth (INDEPENDENT_AMBULATORY_CARE_PROVIDER_SITE_OTHER): Payer: Self-pay | Admitting: Radiology

## 2017-02-04 ENCOUNTER — Telehealth (INDEPENDENT_AMBULATORY_CARE_PROVIDER_SITE_OTHER): Payer: Self-pay | Admitting: Specialist

## 2017-02-04 DIAGNOSIS — Z1231 Encounter for screening mammogram for malignant neoplasm of breast: Secondary | ICD-10-CM | POA: Diagnosis not present

## 2017-02-04 NOTE — Telephone Encounter (Signed)
Patient called needing a call back confirming that the FMLA papers were faxed to [employer] today. The number to contact patient is [phone]

## 2017-02-04 NOTE — Telephone Encounter (Signed)
Spoke with patient she advised scheduled appointment 03/14/17 at 8:30am

## 2017-02-04 NOTE — Telephone Encounter (Signed)
Can you please call and sched appt for f/I=u in approx 4 wks?

## 2017-02-04 NOTE — Telephone Encounter (Signed)
I called and advised patient that I have confirmation that it went through via fax @ 10:55 am this morning to [employer].  Patient requested a copy. I made a copy and put it at the front desk for her to pick up. She is aware

## 2017-02-12 ENCOUNTER — Encounter: Payer: Self-pay | Admitting: Family Medicine

## 2017-02-12 ENCOUNTER — Ambulatory Visit (INDEPENDENT_AMBULATORY_CARE_PROVIDER_SITE_OTHER): Payer: 59 | Admitting: Family Medicine

## 2017-02-12 VITALS — BP 127/80 | HR 85 | Temp 98.8°F | Resp 18 | Ht 61.0 in | Wt 226.8 lb

## 2017-02-12 DIAGNOSIS — IMO0001 Reserved for inherently not codable concepts without codable children: Secondary | ICD-10-CM

## 2017-02-12 DIAGNOSIS — M797 Fibromyalgia: Secondary | ICD-10-CM

## 2017-02-12 DIAGNOSIS — E1142 Type 2 diabetes mellitus with diabetic polyneuropathy: Secondary | ICD-10-CM

## 2017-02-12 DIAGNOSIS — I10 Essential (primary) hypertension: Secondary | ICD-10-CM

## 2017-02-12 DIAGNOSIS — E6609 Other obesity due to excess calories: Secondary | ICD-10-CM | POA: Diagnosis not present

## 2017-02-12 DIAGNOSIS — E7800 Pure hypercholesterolemia, unspecified: Secondary | ICD-10-CM

## 2017-02-12 DIAGNOSIS — F324 Major depressive disorder, single episode, in partial remission: Secondary | ICD-10-CM

## 2017-02-12 DIAGNOSIS — Z6841 Body Mass Index (BMI) 40.0 and over, adult: Secondary | ICD-10-CM | POA: Diagnosis not present

## 2017-02-12 DIAGNOSIS — J301 Allergic rhinitis due to pollen: Secondary | ICD-10-CM | POA: Diagnosis not present

## 2017-02-12 DIAGNOSIS — D72820 Lymphocytosis (symptomatic): Secondary | ICD-10-CM | POA: Diagnosis not present

## 2017-02-12 HISTORY — DX: Essential (primary) hypertension: I10

## 2017-02-12 LAB — POCT GLYCOSYLATED HEMOGLOBIN (HGB A1C): HEMOGLOBIN A1C: 8.2

## 2017-02-12 LAB — GLUCOSE, POCT (MANUAL RESULT ENTRY): POC GLUCOSE: 123 mg/dL — AB (ref 70–99)

## 2017-02-12 NOTE — Patient Instructions (Addendum)
IF you received an x-ray today, you will receive an invoice from [facility]. Please contact [facility] at [phone] with questions or concerns regarding your invoice.   IF you received labwork today, you will receive an invoice from [facility]. Please contact [facility] at [phone] with questions or concerns regarding your invoice.   Our billing staff will not be able to assist you with questions regarding bills from these companies.  You will be contacted with the lab results as soon as they are available. The fastest way to get your results is to activate your My Chart account. Instructions are located on the last page of this paperwork. If you have not heard from [name] regarding the results in 2 weeks, please contact this office.      Diabetes Mellitus and Exercise Exercising regularly is important for your overall health, especially when you have diabetes (diabetes mellitus). Exercising is not only about losing weight. It has many health benefits, such as increasing muscle strength and bone density and reducing body fat and stress. This leads to improved fitness, flexibility, and endurance, all of which result in better overall health. Exercise has additional benefits for people with diabetes, including:  Reducing appetite.  Helping to lower and control blood glucose.  Lowering blood pressure.  Helping to control amounts of fatty substances (lipids) in the blood, such as cholesterol and triglycerides.  Helping the body to respond better to insulin (improving insulin sensitivity).  Reducing how much insulin the body needs.  Decreasing the risk for heart disease by:  Lowering cholesterol and triglyceride levels.  Increasing the levels of good cholesterol.  Lowering blood glucose levels. What is my activity plan? Your health care provider or certified diabetes educator can help you make a plan for the type and frequency of exercise (activity plan) that  works for you. Make sure that you:  Do at least 150 minutes of moderate-intensity or vigorous-intensity exercise each week. This could be brisk walking, biking, or water aerobics.  Do stretching and strength exercises, such as yoga or weightlifting, at least 2 times a week.  Spread out your activity over at least 3 days of the week.  Get some form of physical activity every day.  Do not go more than 2 days in a row without some kind of physical activity.  Avoid being inactive for more than 90 minutes at a time. Take frequent breaks to walk or stretch.  Choose a type of exercise or activity that you enjoy, and set realistic goals.  Start slowly, and gradually increase the intensity of your exercise over time. What do I need to know about managing my diabetes?  Check your blood glucose before and after exercising.  If your blood glucose is higher than 240 mg/dL (13.3 mmol/L) before you exercise, check your urine for ketones. If you have ketones in your urine, do not exercise until your blood glucose returns to normal.  Know the symptoms of low blood glucose (hypoglycemia) and how to treat it. Your risk for hypoglycemia increases during and after exercise. Common symptoms of hypoglycemia can include:  Hunger.  Anxiety.  Sweating and feeling clammy.  Confusion.  Dizziness or feeling light-headed.  Increased heart rate or palpitations.  Blurry vision.  Tingling or numbness around the mouth, lips, or tongue.  Tremors or shakes.  Irritability.  Keep a rapid-acting carbohydrate snack available before, during, and after exercise to help prevent or treat hypoglycemia.  Avoid injecting insulin into areas of the body that  are going to be exercised. For example, avoid injecting insulin into:  The arms, when playing tennis.  The legs, when jogging.  Keep records of your exercise habits. Doing this can help you and your health care provider adjust your diabetes management plan  as needed. Write down:  Food that you eat before and after you exercise.  Blood glucose levels before and after you exercise.  The type and amount of exercise you have done.  When your insulin is expected to peak, if you use insulin. Avoid exercising at times when your insulin is peaking.  When you start a new exercise or activity, work with your health care provider to make sure the activity is safe for you, and to adjust your insulin, medicines, or food intake as needed.  Drink plenty of water while you exercise to prevent dehydration or heat stroke. Drink enough fluid to keep your urine clear or pale yellow. This information is not intended to replace advice given to you by your health care provider. Make sure you discuss any questions you have with your health care provider. Document Released: 01/18/2004 Document Revised: 05/17/2016 Document Reviewed: 04/08/2016 Elsevier Interactive Patient Education  2017 [employer].

## 2017-02-12 NOTE — Progress Notes (Signed)
Subjective:    Patient ID: Cristina [name], female    DOB: 1961/05/17, 56 y.o.   MRN: [id number]  02/12/2017  Follow-up (3 month follow up--urine collected) and Diabetes   HPI This 56 y.o. female presents for three month follow-up DMII, hypertension, hypercholesterolemia.  Continues to go to school; has two more weeks remaining.  Has a D in one class.  Not motivated this semester.  Depressed a lot.  Life in general; misses brother.  Upcoming year anniversary of brother's death.  The other brothers are not supportive.  Not interested in counseling.  Prefers to spend more time with church family.  [place] spending more time with mother. Cristina [name] is [age].   Daughter may move to [place] this summer.  Has been begging daughter to move to [place]; has a one year old.  Sugars are out of wack.   Taking Victoza to 1.8.  Started [employer] yesterday.  Has been eating anything and everything and "whatever I wanted".   Three days on and four days off.    Hematology appointment next week.   Immunization History  Administered Date(s) Administered  . Hepatitis B 11/12/1999  . Influenza Split 11/11/2010, 08/14/2012, 08/25/2014  . Influenza-Unspecified 07/12/2016  . Pneumococcal Polysaccharide-23 11/12/2007, 05/04/2016  . Td 11/11/1998  . Tdap 02/22/2010   BP Readings from Last 3 Encounters:  03/06/17 118/73  02/24/17 (!) 89/49  02/17/17 108/68   Wt Readings from Last 3 Encounters:  03/06/17 224 lb 9.6 oz (101.9 kg)  02/24/17 223 lb 9.6 oz (101.4 kg)  02/12/17 226 lb 12.8 oz (102.9 kg)    Review of Systems  Constitutional: Negative for chills, diaphoresis, fatigue and fever.  Eyes: Negative for visual disturbance.  Respiratory: Negative for cough and shortness of breath.   Cardiovascular: Negative for chest pain, palpitations and leg swelling.  Gastrointestinal: Negative for abdominal pain, constipation, diarrhea, nausea and vomiting.  Endocrine: Negative for cold intolerance, heat  intolerance, polydipsia, polyphagia and polyuria.  Neurological: Negative for dizziness, tremors, seizures, syncope, facial asymmetry, speech difficulty, weakness, light-headedness, numbness and headaches.    Past Medical History:  Diagnosis Date  . Allergic rhinitis, cause unspecified   . Arthritis   . Constipation   . CTS (carpal tunnel syndrome)   . Cystocele   . Depression   . Diabetes mellitus   . Dysfunction of eustachian tube   . Fatty liver   . Fissure, anal   . Genital herpes   . GERD (gastroesophageal reflux disease)   . Hemorrhoid   . Hx of migraine headaches   . Hyperlipidemia   . Hypertension   . Insomnia   . Iron deficiency anemia, unspecified   . Obesity   . OSA on CPAP   . Rectocele    Past Surgical History:  Procedure Laterality Date  . ABDOMINAL HYSTERECTOMY  11/11/2010   Marvel Plan.  Ovaries intact.  Uterine fibroids with DUB.  [name] CARPAL TUNNEL RELEASE  1989   Left  . CHOLECYSTECTOMY    . CHOLECYSTECTOMY    . COLONOSCOPY  06/29/2012   normal.  Repeat in 5 years.  [name] [place]  . ESOPHAGOGASTRODUODENOSCOPY  12/13/2011   dysphagia.  Henrene Pastor.  Normal.  . Sleep study  09/11/2012   severe OSA.  CPAP titration at 12 cm water pressure.  . TUBAL LIGATION  1988   Allergies  Allergen Reactions  . Fish Oil Rash    Social History   Social History  . Marital status: Single  Spouse name: n/a  . Number of children: 3  . Years of education: college   Occupational History  . [employer]   Social History Main Topics  . Smoking status: Former Research scientist (life sciences)  . Smokeless tobacco: Never Used     Comment: smoked occasionally longest 6 mos.  . Alcohol use No  . Drug use: No  . Sexual activity: No   Other Topics Concern  . Not on file   Social History Narrative      Marital status:  Divorced in 2000 after six years of marriage; not dating in 2017.      Children:  3 daughters (38, 79, 18); 6  grandchildren.      Lives: alone.  2 dogs; grandson stays some.      Employment:  Working at [employer]. 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      Tobacco: none      Alcohol: socially; special occasions.  Rare.      Drugs: none      Exercise:  Sporadic; stationary bike.      Sexual activity:  Not sexually active since 2011.       Guns:  No guns in the home.       Smoke detectors in use,       Seatbelt:  Sometimes uses seat belts. 75% of time.  No texting while driving.     Family History  Problem Relation Age of Onset  . Hypertension Mother 57  . Osteoarthritis Mother   . Diabetes Mother   . Colon cancer Mother 68  . Cancer Mother 38    colon cancer  . Colon cancer Paternal Grandmother   . Cancer Paternal Grandmother   . Stroke Maternal Grandmother   . Colon polyps Brother     x 2 brother  . Diabetes Daughter   . Hypertension Daughter   . Sleep apnea Daughter   . Mental illness Daughter     depression  . Migraines Daughter   . Colon polyps Brother   . Cirrhosis Brother     alcoholism  . Alcohol abuse Brother   . Migraines Daughter   . Mental illness Daughter     anxiety attacks  . Protein S deficiency Daughter   . Sleep apnea Brother   . Hyperlipidemia Brother   . Colon cancer Paternal Aunt        Objective:    BP 127/80   Pulse 85   Temp 98.8 F (37.1 C) (Oral)   Resp 18   Ht 5\' 1"  (1.549 m)   Wt 226 lb 12.8 oz (102.9 kg)   SpO2 93%   BMI 42.85 kg/m  Physical Exam  Constitutional: She is oriented to person, place, and time. She appears well-developed and well-nourished. No distress.  HENT:  Head: Normocephalic and atraumatic.  Right Ear: External ear normal.  Left Ear: External ear normal.  Nose: Nose normal.  Mouth/Throat: Oropharynx is clear and moist.  Eyes: Conjunctivae and EOM are normal. Pupils are equal, round, and reactive to light.  Neck: Normal range of motion. Neck supple. Carotid bruit is not present. No thyromegaly present.    Cardiovascular: Normal rate, regular rhythm, normal heart sounds and intact distal pulses.  Exam reveals no gallop and no friction rub.   No murmur heard. Pulmonary/Chest: Effort normal and breath sounds normal. She has no wheezes. She has no rales.  Abdominal: Soft. Bowel sounds are normal. She exhibits no  distension and no mass. There is no tenderness. There is no rebound and no guarding.  Lymphadenopathy:    She has no cervical adenopathy.  Neurological: She is alert and oriented to person, place, and time. No cranial nerve deficit.  Skin: Skin is warm and dry. No rash noted. She is not diaphoretic. No erythema. No pallor.  Psychiatric: She has a normal mood and affect. Her behavior is normal.    Depression screen [facility] 2/9 02/12/2017 12/18/2016 12/12/2016 12/07/2016 10/30/2016  Decreased Interest 0 0 0 3 0  Down, Depressed, Hopeless 0 0 1 1 0  PHQ - 2 Score 0 0 1 4 0  Altered sleeping - - - 1 -  Tired, decreased energy - - - 3 -  Change in appetite - - - 3 -  Feeling bad or failure about yourself  - - - 0 -  Trouble concentrating - - - 2 -  Moving slowly or fidgety/restless - - - 0 -  Suicidal thoughts - - - 0 -  PHQ-9 Score - - - 13 -  Difficult doing work/chores - - - Very difficult -  Some recent data might be hidden       Assessment & Plan:   1. Type 2 diabetes mellitus with diabetic polyneuropathy, without long-term current use of insulin ([place])   2. Pure hypercholesterolemia   3. Essential hypertension   4. Class 3 obesity due to excess calories with serious comorbidity and body mass index (BMI) of 40.0 to 44.9 in adult ([place])   5. Seasonal allergic rhinitis due to pollen   6. Lymphocytosis   7. Fibromyalgia   8. Major depressive disorder with single episode, in partial remission ([place])    -uncontrolled DMII; poor motivation to make diet and lifestyle changes. -refer to weight loss management with Dr. Dennard Nip as she has multiple comorbidities that are related to  obesity. -obtain labs. -encourage utilizing [email or web address]://www.matthews.info/; also recommend exercising daily. -has upcoming appointment with hematology for persistently elevated WBC count.   Orders Placed This Encounter  Procedures  . CBC with Differential/Platelet  . Comprehensive metabolic panel    Order Specific Question:   Has the patient fasted?    Answer:   Yes  . Lipid panel    Order Specific Question:   Has the patient fasted?    Answer:   Yes  . Amb Ref to Medical Weight Management    Referral Priority:   Routine    Referral Type:   Consultation    Referred to Provider:   Starlyn Skeans, MD    Number of Visits Requested:   1  . POCT glucose (manual entry)  . POCT glycosylated hemoglobin (Hb A1C)  . HM Diabetes Foot Exam   No orders of the defined types were placed in this encounter.   Return in about 3 months (around 05/14/2017) for complete physical examiniation.   Cristina [name], M.D. Primary Care at [facility] previously [facility] [place] [address] [place], [place]  [address] [phone] phone [phone] fax

## 2017-02-13 ENCOUNTER — Encounter: Payer: Self-pay | Admitting: Family Medicine

## 2017-02-13 LAB — CBC WITH DIFFERENTIAL/PLATELET
BASOS: 0 %
Basophils Absolute: 0 10*3/uL (ref 0.0–0.2)
EOS (ABSOLUTE): 0.3 10*3/uL (ref 0.0–0.4)
EOS: 2 %
HEMATOCRIT: 41.8 % (ref 34.0–46.6)
Hemoglobin: 13.5 g/dL (ref 11.1–15.9)
IMMATURE GRANS (ABS): 0 10*3/uL (ref 0.0–0.1)
IMMATURE GRANULOCYTES: 0 %
LYMPHS: 39 %
Lymphocytes Absolute: 4.4 10*3/uL — ABNORMAL HIGH (ref 0.7–3.1)
MCH: 27.3 pg (ref 26.6–33.0)
MCHC: 32.3 g/dL (ref 31.5–35.7)
MCV: 85 fL (ref 79–97)
Monocytes Absolute: 0.5 10*3/uL (ref 0.1–0.9)
Monocytes: 5 %
Neutrophils Absolute: 6.2 10*3/uL (ref 1.4–7.0)
Neutrophils: 54 %
PLATELETS: 332 10*3/uL (ref 150–379)
RBC: 4.94 x10E6/uL (ref 3.77–5.28)
RDW: 15.7 % — ABNORMAL HIGH (ref 12.3–15.4)
WBC: 11.5 10*3/uL — AB (ref 3.4–10.8)

## 2017-02-13 LAB — COMPREHENSIVE METABOLIC PANEL
A/G RATIO: 1.4 (ref 1.2–2.2)
ALT: 49 IU/L — AB (ref 0–32)
AST: 31 IU/L (ref 0–40)
Albumin: 4.2 g/dL (ref 3.5–5.5)
Alkaline Phosphatase: 85 IU/L (ref 39–117)
BUN/Creatinine Ratio: 19 (ref 9–23)
BUN: 15 mg/dL (ref 6–24)
Bilirubin Total: 0.2 mg/dL (ref 0.0–1.2)
CALCIUM: 9.6 mg/dL (ref 8.7–10.2)
CO2: 23 mmol/L (ref 18–29)
CREATININE: 0.79 mg/dL (ref 0.57–1.00)
Chloride: 101 mmol/L (ref 96–106)
GFR, EST AFRICAN AMERICAN: 99 mL/min/{1.73_m2} (ref >59)
GFR, EST NON AFRICAN AMERICAN: 86 mL/min/{1.73_m2} (ref >59)
Globulin, Total: 2.9 g/dL (ref 1.5–4.5)
Glucose: 130 mg/dL — ABNORMAL HIGH (ref 65–99)
POTASSIUM: 4.3 mmol/L (ref 3.5–5.2)
Sodium: 141 mmol/L (ref 134–144)
TOTAL PROTEIN: 7.1 g/dL (ref 6.0–8.5)

## 2017-02-13 LAB — LIPID PANEL
CHOL/HDL RATIO: 4.2 ratio (ref 0.0–4.4)
Cholesterol, Total: 190 mg/dL (ref 100–199)
HDL: 45 mg/dL (ref >39)
LDL CALC: 114 mg/dL — AB (ref 0–99)
TRIGLYCERIDES: 154 mg/dL — AB (ref 0–149)
VLDL CHOLESTEROL CAL: 31 mg/dL (ref 5–40)

## 2017-02-17 ENCOUNTER — Encounter (INDEPENDENT_AMBULATORY_CARE_PROVIDER_SITE_OTHER): Payer: Self-pay | Admitting: Physical Medicine and Rehabilitation

## 2017-02-17 ENCOUNTER — Ambulatory Visit (INDEPENDENT_AMBULATORY_CARE_PROVIDER_SITE_OTHER): Payer: 59 | Admitting: Physical Medicine and Rehabilitation

## 2017-02-17 ENCOUNTER — Ambulatory Visit (INDEPENDENT_AMBULATORY_CARE_PROVIDER_SITE_OTHER): Payer: Self-pay

## 2017-02-17 VITALS — BP 108/68 | HR 82 | Temp 99.3°F

## 2017-02-17 DIAGNOSIS — M47816 Spondylosis without myelopathy or radiculopathy, lumbar region: Secondary | ICD-10-CM

## 2017-02-17 MED ORDER — LIDOCAINE HCL (PF) 1 % IJ SOLN
0.3300 mL | Freq: Once | INTRAMUSCULAR | Status: AC
  Administered 2017-02-17: 0.3 mL

## 2017-02-17 MED ORDER — METHYLPREDNISOLONE ACETATE 80 MG/ML IJ SUSP
80.0000 mg | Freq: Once | INTRAMUSCULAR | Status: AC
  Administered 2017-02-17: 80 mg

## 2017-02-17 NOTE — Patient Instructions (Signed)

## 2017-02-17 NOTE — Progress Notes (Signed)
Cristina [name] - 56 y.o. female MRN [id number]  Date of birth: 15-Jun-1961  Office Visit Note: Visit Date: 02/17/2017 PCP: Reginia Forts, MD Referred by: [name], MD  Subjective: Chief Complaint  Patient presents with  . Lower Back - Pain   HPI: Cristina [name] is a 56 year old female with chronic worsening pain in the middle of the low back without radicular complaints. She has no numbness tingling or paresthesia. Her pain is worse with standing and going from sit to stand. She is better moving around. We completed radiofrequency ablation of her sacroiliac joint with good relief but she still having low back pain. Dr. Louanne Skye requests a diagnostic facet joint blocks of the bilateral L4-5 and L5-S1 facet joints.    ROS Otherwise per HPI.  Assessment & Plan: Visit Diagnoses:  1. Spondylosis without myelopathy or radiculopathy, lumbar region     Plan: Findings:  Bilateral L4-5 and L5-S1 facet joint blocks with fluoroscopic guidance.    Meds & Orders:  Meds ordered this encounter  Medications  . lidocaine (PF) (XYLOCAINE) 1 % injection 0.3 mL  . methylPREDNISolone acetate (DEPO-MEDROL) injection 80 mg    Orders Placed This Encounter  Procedures  . Facet Injection  . XR C-ARM NO REPORT    Follow-up: Return for Dr. Louanne Skye.   Procedures: No procedures performed  Lumbar Facet Joint Intra-Articular Injection(s) with Fluoroscopic Guidance  Patient: Cristina [name]      Date of Birth: 1961/08/19 MRN: [id number] PCP: Reginia Forts, MD      Visit Date: 02/17/2017   Universal Protocol:    Date/Time: 04/11/185:20 AM  Consent Given By: the patient  Position: PRONE   Additional Comments: Vital signs were monitored before and after the procedure. Patient was prepped and draped in the usual sterile fashion. The correct patient, procedure, and site was verified.   Injection Procedure Details:  Procedure Site One Meds Administered:  Meds ordered this encounter  Medications  .  lidocaine (PF) (XYLOCAINE) 1 % injection 0.3 mL  . methylPREDNISolone acetate (DEPO-MEDROL) injection 80 mg     Laterality: Bilateral  Location/Site:  L4-L5 L5-S1  Needle size: 22 guage  Needle type: Spinal  Needle Placement: Articular  Findings:  -Contrast Used: 1 mL iohexol 180 mg iodine/mL   -Comments: Excellent flow of contrast producing a partial arthrogram.  Procedure Details: The fluoroscope beam is vertically oriented in AP, and the inferior recess is visualized beneath the lower pole of the inferior apophyseal process, which represents the target point for needle insertion. When direct visualization is difficult the target point is located at the medial projection of the vertebral pedicle. The region overlying each aforementioned target is locally anesthetized with a 1 to 2 ml. volume of 1% Lidocaine without Epinephrine.   The spinal needle was inserted into each of the above mentioned facet joints using biplanar fluoroscopic guidance. A 0.25 to 0.5 ml. volume of Isovue-250 was injected and a partial facet joint arthrogram was obtained. A single spot film was obtained of the resulting arthrogram.    One to 1.25 ml of the steroid/anesthetic solution was then injected into each of the facet joints noted above.   Additional Comments:  The patient tolerated the procedure well Dressing: Band-Aid    Post-procedure details: Patient was observed during the procedure. Post-procedure instructions were reviewed.  Patient left the clinic in stable condition.     Clinical History: Lspine MRI 02/29/2016 IMPRESSION: 1. Moderate left subarticular and foraminal stenosis at L4-5 secondary to uncovering of a  leftward disc protrusion associated with moderate bilateral facet hypertrophy and 7 mm grade 1 anterolisthesis. 2. Mild right subarticular and foraminal stenosis at L4-5. 3. Chronic endplate changes anteriorly at L1-2 with a mild broad-based disc protrusion which partially  effaces the ventral CSF but does not create any significant focal stenosis. 4. Multilevel facet hypertrophy throughout the lumbar spine, most prominent at L4-5.  She reports that she has quit smoking. She has never used smokeless tobacco.   Recent Labs  08/27/16 1157 12/07/16 0907 02/12/17 0952  HGBA1C 7.1* 7.7 8.2    Objective:  VS:  HT:    WT:   BMI:     BP:108/68  HR:82bpm  TEMP:99.3 F (37.4 C)(Oral)  RESP:98 % Physical Exam  Musculoskeletal:  The patient ambulates without aid with a forward flexed spine with good distal strength.    Ortho Exam Imaging: No results found.  Past Medical/Family/Surgical/Social History: Medications & Allergies reviewed per EMR Patient Active Problem List   Diagnosis Date Noted  . Essential hypertension 02/12/2017  . Mild intermittent asthma with acute exacerbation 12/12/2016  . Left shoulder pain 06/02/2015  . Insomnia 05/12/2014  . Fibromyalgia 05/12/2014  . Allergic rhinitis 03/28/2014  . Osteoarthritis 12/20/2013  . Cervical strain 06/21/2013  . Low back pain 06/21/2013  . Metatarsalgia of both feet 03/11/2013  . Chest pain 01/25/2013  . Sciatica 01/25/2013  . Restless leg syndrome 11/24/2012  . Pure hypercholesterolemia 10/20/2012  . OSA on CPAP 10/20/2012  . Depression 10/20/2012  . Iron deficiency anemia, unspecified 10/01/2012  . Diabetes mellitus, type 2 ([place]) 12/31/2010  . ANAL FISSURE 12/31/2010  . Morbid obesity ([place]) 01/08/2007  . CONSTIPATION 01/08/2007   Past Medical History:  Diagnosis Date  . Allergic rhinitis, cause unspecified   . Arthritis   . Constipation   . CTS (carpal tunnel syndrome)   . Cystocele   . Depression   . Diabetes mellitus   . Dysfunction of eustachian tube   . Fatty liver   . Fissure, anal   . Genital herpes   . GERD (gastroesophageal reflux disease)   . Hemorrhoid   . Hx of migraine headaches   . Hyperlipidemia   . Hypertension   . Insomnia   . Iron deficiency anemia,  unspecified   . Obesity   . OSA on CPAP   . Rectocele    Family History  Problem Relation Age of Onset  . Hypertension Mother 48  . Osteoarthritis Mother   . Diabetes Mother   . Colon cancer Mother 24  . Cancer Mother 43    colon cancer  . Colon cancer Paternal Grandmother   . Cancer Paternal Grandmother   . Stroke Maternal Grandmother   . Colon polyps Brother     x 2 brother  . Diabetes Daughter   . Hypertension Daughter   . Sleep apnea Daughter   . Mental illness Daughter     depression  . Migraines Daughter   . Colon polyps Brother   . Cirrhosis Brother     alcoholism  . Alcohol abuse Brother   . Migraines Daughter   . Mental illness Daughter     anxiety attacks  . Protein S deficiency Daughter   . Sleep apnea Brother   . Hyperlipidemia Brother   . Colon cancer Paternal Aunt    Past Surgical History:  Procedure Laterality Date  . ABDOMINAL HYSTERECTOMY  11/11/2010   Marvel Plan.  Ovaries intact.  Uterine fibroids with DUB.  [name] CARPAL TUNNEL RELEASE  1989   Left  . CHOLECYSTECTOMY    . CHOLECYSTECTOMY    . COLONOSCOPY  06/29/2012   normal.  Repeat in 5 years.  [name] [place]  . ESOPHAGOGASTRODUODENOSCOPY  12/13/2011   dysphagia.  Henrene Pastor.  Normal.  . Sleep study  09/11/2012   severe OSA.  CPAP titration at 12 cm water pressure.  . TUBAL LIGATION  1988   Social History   Occupational History  . [employer]   Social History Main Topics  . Smoking status: Former Research scientist (life sciences)  . Smokeless tobacco: Never Used     Comment: smoked occasionally longest 6 mos.  . Alcohol use No  . Drug use: No  . Sexual activity: No

## 2017-02-19 NOTE — Procedures (Signed)
Lumbar Facet Joint Intra-Articular Injection(s) with Fluoroscopic Guidance  Patient: Cristina [name]      Date of Birth: 1961-07-20 MRN: [id number] PCP: Reginia Forts, MD      Visit Date: 02/17/2017   Universal Protocol:    Date/Time: 04/11/185:20 AM  Consent Given By: the patient  Position: PRONE   Additional Comments: Vital signs were monitored before and after the procedure. Patient was prepped and draped in the usual sterile fashion. The correct patient, procedure, and site was verified.   Injection Procedure Details:  Procedure Site One Meds Administered:  Meds ordered this encounter  Medications  . lidocaine (PF) (XYLOCAINE) 1 % injection 0.3 mL  . methylPREDNISolone acetate (DEPO-MEDROL) injection 80 mg     Laterality: Bilateral  Location/Site:  L4-L5 L5-S1  Needle size: 22 guage  Needle type: Spinal  Needle Placement: Articular  Findings:  -Contrast Used: 1 mL iohexol 180 mg iodine/mL   -Comments: Excellent flow of contrast producing a partial arthrogram.  Procedure Details: The fluoroscope beam is vertically oriented in AP, and the inferior recess is visualized beneath the lower pole of the inferior apophyseal process, which represents the target point for needle insertion. When direct visualization is difficult the target point is located at the medial projection of the vertebral pedicle. The region overlying each aforementioned target is locally anesthetized with a 1 to 2 ml. volume of 1% Lidocaine without Epinephrine.   The spinal needle was inserted into each of the above mentioned facet joints using biplanar fluoroscopic guidance. A 0.25 to 0.5 ml. volume of Isovue-250 was injected and a partial facet joint arthrogram was obtained. A single spot film was obtained of the resulting arthrogram.    One to 1.25 ml of the steroid/anesthetic solution was then injected into each of the facet joints noted above.   Additional Comments:  The patient tolerated the  procedure well Dressing: Band-Aid    Post-procedure details: Patient was observed during the procedure. Post-procedure instructions were reviewed.  Patient left the clinic in stable condition.

## 2017-02-21 ENCOUNTER — Encounter: Payer: Self-pay | Admitting: Family Medicine

## 2017-02-21 ENCOUNTER — Telehealth: Payer: Self-pay | Admitting: Family Medicine

## 2017-02-21 DIAGNOSIS — M797 Fibromyalgia: Secondary | ICD-10-CM

## 2017-02-21 NOTE — Telephone Encounter (Signed)
Would you be willing to write this rx?

## 2017-02-21 NOTE — Telephone Encounter (Signed)
Pt is needing a written rx for massage therapy to get reimbursed from her benny card she does not need a referral just a script to show them so they will let her payment get approved thru the benny card  Best number [phone]  She does the massage therapy for her fibromyalgia

## 2017-02-24 ENCOUNTER — Ambulatory Visit (HOSPITAL_BASED_OUTPATIENT_CLINIC_OR_DEPARTMENT_OTHER): Payer: 59 | Admitting: Hematology and Oncology

## 2017-02-24 VITALS — BP 89/49 | HR 93 | Temp 99.0°F | Resp 18 | Ht 61.0 in | Wt 223.6 lb

## 2017-02-24 DIAGNOSIS — D72820 Lymphocytosis (symptomatic): Secondary | ICD-10-CM | POA: Diagnosis not present

## 2017-02-24 NOTE — Assessment & Plan Note (Signed)
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 This has been going on for the past 3 years since 2015. Patient is completely asymptomatic.  I discussed with the patient the different types of white blood cells and their functions. We also discussed the role of lymphocyte counts in immunity.  Differential diagnosis of lymphocytosis: 1. Viral illnesses 2. Medications 3. CLL requires a lymphocyte count more than 5000 4. 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

## 2017-02-24 NOTE — Telephone Encounter (Signed)
Rx ordered in EPIC.  Please fax to [facility] spending account office.

## 2017-02-24 NOTE — Telephone Encounter (Signed)
Can we fax the order for massage to the spending account office at [facility]?  I will ask patient for the fax number.  Thanks, Norwood Levo, M.D. Primary Care at [facility] previously [facility] [place] [address] [place], [place]  [address] [phone] phone [phone] fax

## 2017-02-24 NOTE — Progress Notes (Signed)
[facility] [place] NOTE  Patient Care Team: [name], MD as PCP - General (Family Medicine) Jacob Moores, [facility] as [place] [facility] (Pharmacist)  CHIEF COMPLAINTS/PURPOSE OF CONSULTATION:  Lymphocytosis  HISTORY OF PRESENTING ILLNESS:  Cristina [name] 56 y.o. female is here because of lymphocytosis. Patient has had elevation of white blood cell count for long time at least the last 3 years. She also had elevated lymphocyte count since 2015. The lymphocyte count had fluctuated between 30 505,400. This has not been associated with any symptoms. She denies any fevers or chills or night sweats or weight loss. Denies any lymph node enlargement.  I reviewed her records extensively and collaborated the history with the patient.  MEDICAL HISTORY:  Past Medical History:  Diagnosis Date  . Allergic rhinitis, cause unspecified   . Arthritis   . Constipation   . CTS (carpal tunnel syndrome)   . Cystocele   . Depression   . Diabetes mellitus   . Dysfunction of eustachian tube   . Fatty liver   . Fissure, anal   . Genital herpes   . GERD (gastroesophageal reflux disease)   . Hemorrhoid   . Hx of migraine headaches   . Hyperlipidemia   . Hypertension   . Insomnia   . Iron deficiency anemia, unspecified   . Obesity   . OSA on CPAP   . Rectocele     SURGICAL HISTORY: Past Surgical History:  Procedure Laterality Date  . ABDOMINAL HYSTERECTOMY  11/11/2010   [place] Plan.  Ovaries intact.  Uterine fibroids with DUB.  [name] CARPAL TUNNEL RELEASE  1989   Left  . CHOLECYSTECTOMY    . CHOLECYSTECTOMY    . COLONOSCOPY  06/29/2012   normal.  Repeat in 5 years.  [name] [place]  . ESOPHAGOGASTRODUODENOSCOPY  12/13/2011   dysphagia.  Henrene Pastor.  Normal.  . Sleep study  09/11/2012   severe OSA.  CPAP titration at 12 cm water pressure.  . TUBAL LIGATION  1988    SOCIAL HISTORY: Social History   Social History  . Marital status:  Single    Spouse name: n/a  . Number of children: 3  . Years of education: college   Occupational History  . [place]   Social History Main Topics  . Smoking status: Former Research scientist (life sciences)  . Smokeless tobacco: Never Used     Comment: smoked occasionally longest 6 mos.  . Alcohol use No  . Drug use: No  . Sexual activity: No   Other Topics Concern  . Not on file   Social History Narrative      Marital status:  Divorced in 2000 after six years of marriage; not dating in 2017.      Children:  3 daughters (38, 86, 18); 6 grandchildren.      Lives: alone.  2 dogs; grandson stays some.      Employment:  Working at [employer]. [place] [employer] team.      Tobacco: none      Alcohol: socially; special occasions.  Rare.      Drugs: none      Exercise:  Sporadic; stationary bike.      Sexual activity:  Not sexually active since 2011.       Guns:  No guns in the home.       Smoke detectors in use,  Seatbelt:  Sometimes uses seat belts. 75% of time.  No texting while driving.      FAMILY HISTORY: Family History  Problem Relation Age of Onset  . Hypertension Mother 26  . Osteoarthritis Mother   . Diabetes Mother   . Colon cancer Mother 43  . Cancer Mother 59    colon cancer  . Colon cancer Paternal Grandmother   . Cancer Paternal Grandmother   . Stroke Maternal Grandmother   . Colon polyps Brother     x 2 brother  . Diabetes Daughter   . Hypertension Daughter   . Sleep apnea Daughter   . Mental illness Daughter     depression  . Migraines Daughter   . Colon polyps Brother   . Cirrhosis Brother     alcoholism  . Alcohol abuse Brother   . Migraines Daughter   . Mental illness Daughter     anxiety attacks  . Protein S deficiency Daughter   . Sleep apnea Brother   . Hyperlipidemia Brother   . Colon cancer Paternal Aunt     ALLERGIES:  is allergic to fish oil.  MEDICATIONS:  Current Outpatient  Prescriptions  Medication Sig Dispense Refill  . acyclovir (ZOVIRAX) 400 MG tablet TAKE 1 TABLET BY MOUTH 2 TIMES DAILY. 180 tablet 3  . albuterol (PROAIR HFA) 108 (90 Base) MCG/ACT inhaler Inhale 2 puffs into the lungs every 6 (six) hours as needed. 18 g 1  . ALPRAZolam (XANAX) 0.5 MG tablet TAKE 1 TABLET BY MOUTH ONCE DAILY AS NEEDED FOR ANXIETY 30 tablet 0  . AMBULATORY NON FORMULARY MEDICATION Reported on 10/26/2015    . ANUCORT-HC 25 MG suppository PLACE 1 SUPPOSITORY RECTALLY 2 TIMES DAILY AS NEEDED FOR HEMORRHOIDS. 25 suppository 5  . aspirin EC 81 MG tablet Take 81 mg by mouth daily.    [name] azelastine (ASTELIN) 0.1 % nasal spray PLACE 2 SPRAYS INTO BOTH NOSTRILS 2 TIMES DAILY AS DIRECTED 90 mL 3  . azelastine (OPTIVAR) 0.05 % ophthalmic solution Place 1 drop into both eyes daily.   3  . BIOTIN 5000 PO Take by mouth.    [name] buPROPion (WELLBUTRIN SR) 100 MG 12 hr tablet Take 1 tablet (100 mg total) by mouth daily. 90 tablet 3  . Calcium Carbonate-Vitamin D (CALCIUM + D PO) Take 1 tablet by mouth daily.    . canagliflozin (INVOKANA) 300 MG TABS tablet Take 1 tablet (300 mg total) by mouth daily before breakfast. 90 tablet 3  . cetirizine (ZYRTEC) 10 MG tablet Take 10 mg by mouth daily.    [name] diltiazem 2 % GEL Apply 1 application topically 5 (five) times daily. 1 Package 3  . docusate sodium (COLACE) 100 MG capsule Take 100 mg by mouth daily. Reported on 05/04/2016    . ferrous sulfate 325 (65 FE) MG EC tablet Take 325 mg by mouth daily.     . fluticasone (FLONASE) 50 MCG/ACT nasal spray PLACE 2 SPRAYS INTO BOTH NOSTRILS DAILY. 48 g 3  . gabapentin (NEURONTIN) 300 MG capsule Take 1 capsule (300 mg total) by mouth 3 (three) times daily. 270 capsule 1  . glipiZIDE (GLIPIZIDE XL) 10 MG 24 hr tablet TAKE 1 TABLET BY MOUTH DAILY WITH BREAKFAST. 90 tablet 3  . Glucosamine HCl 1000 MG TABS Take 1,000 mg by mouth 2 (two) times daily with a meal.     . glucose blood (TRUE METRIX BLOOD GLUCOSE TEST) test  strip 1 each by Other route 2 (two) times  daily as needed for other. Use as instructed    . hydrocortisone 2.5 % ointment Apply topically 2 (two) times daily. 30 g 0  . Insulin Pen Needle (PEN NEEDLES) 31G X 5 MM MISC 1 each by Does not apply route daily. USE TO INJECT VICTOZA DAILY AS DIRECTED 100 each 3  . liraglutide 18 MG/3ML SOPN Inject 0.3 mLs (1.8 mg total) into the skin daily. 6 mL 11  . lisinopril (PRINIVIL,ZESTRIL) 5 MG tablet Take 1 tablet (5 mg total) by mouth daily. 90 tablet 3  . metFORMIN (GLUCOPHAGE) 1000 MG tablet Take 1 tablet (1,000 mg total) by mouth 2 (two) times daily with a meal. 180 tablet 3  . montelukast (SINGULAIR) 10 MG tablet Take 1 tablet (10 mg total) by mouth at bedtime. 90 tablet 3  . Multiple Vitamin (MULTIVITAMIN) capsule Take 1 capsule by mouth daily.    . nitroGLYCERIN (NITROSTAT) 0.4 MG SL tablet Place 1 tablet (0.4 mg total) under the tongue every 5 (five) minutes as needed for chest pain. 20 tablet 0  . pantoprazole (PROTONIX) 40 MG tablet TAKE 1 TABLET BY MOUTH 2 TIMES DAILY. 180 tablet 3  . rOPINIRole (REQUIP) 1 MG tablet TAKE 1 TABLET BY MOUTH EVERY NIGHT AT BEDTIME. 90 tablet 3  . sertraline (ZOLOFT) 100 MG tablet TAKE 1 & 1/2 TABLET BY MOUTH DAILY 135 tablet 3  . simvastatin (ZOCOR) 40 MG tablet TAKE 1 TABLET BY MOUTH DAILY AT 6 PM. 90 tablet 3  . TRUEPLUS LANCETS 30G MISC USE TO CHECK BLOOD GLUCOSE DAILY AS DIRECTED 100 each 3   No current facility-administered medications for this visit.    Facility-Administered Medications Ordered in Other Visits  Medication Dose Route Frequency Provider Last Rate Last Dose  . technetium tetrofosmin (TC-MYOVIEW) injection 27.7 millicurie  41.2 millicurie Intravenous Once PRN Larey Dresser, MD        REVIEW OF SYSTEMS:   Constitutional: Denies fevers, chills or abnormal night sweats Eyes: Denies blurriness of vision, double vision or watery eyes Ears, nose, mouth, throat, and face: Denies mucositis or sore  throat Respiratory: Denies cough, dyspnea or wheezes Cardiovascular: Denies palpitation, chest discomfort or lower extremity swelling Gastrointestinal:  Denies nausea, heartburn or change in bowel habits Skin: Denies abnormal skin rashes Lymphatics: Denies new lymphadenopathy or easy bruising Neurological:Denies numbness, tingling or new weaknesses Behavioral/Psych: Mood is stable, no new changes  All other systems were reviewed with the patient and are negative.  PHYSICAL EXAMINATION: ECOG PERFORMANCE STATUS: 1 - Symptomatic but completely ambulatory  Vitals:   02/24/17 1600  BP: (!) 89/49  Pulse: 93  Resp: 18  Temp: 99 F (37.2 C)   Filed Weights   02/24/17 1600  Weight: 223 lb 9.6 oz (101.4 kg)    GENERAL:alert, no distress and comfortable SKIN: skin color, texture, turgor are normal, no rashes or significant lesions EYES: normal, conjunctiva are pink and non-injected, sclera clear OROPHARYNX:no exudate, no erythema and lips, buccal mucosa, and tongue normal  NECK: supple, thyroid normal size, non-tender, without nodularity LYMPH:  no palpable lymphadenopathy in the cervical, axillary or inguinal LUNGS: clear to auscultation and percussion with normal breathing effort HEART: regular rate & rhythm and no murmurs and no lower extremity edema ABDOMEN:abdomen soft, non-tender and normal bowel sounds Musculoskeletal:no cyanosis of digits and no clubbing  PSYCH: alert & oriented x 3 with fluent speech NEURO: no focal motor/sensory deficits   LABORATORY DATA:  I have reviewed the data as listed Lab Results  Component  Value Date   WBC 11.5 (H) 02/12/2017   HGB 13.7 12/12/2016   HCT 41.8 02/12/2017   MCV 85 02/12/2017   PLT 332 02/12/2017   Lab Results  Component Value Date   NA 141 02/12/2017   K 4.3 02/12/2017   CL 101 02/12/2017   CO2 23 02/12/2017   ASSESSMENT AND PLAN:  Lymphocytosis 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 This has been going on  for the past 3 years since 2015. Patient is completely asymptomatic.  I discussed with the patient the different types of white blood cells and their functions. We also discussed the role of lymphocyte counts in immunity.  Differential diagnosis of lymphocytosis: 1. Viral illnesses 2. Medications 3. CLL requires a lymphocyte count more than 5000 4. Benign monoclonal B-cell lymphocytosis: with a lymphocyte count between 3500-5,000  Patient has multiple chronic medical problems and is on 2 pages full of medications. I reviewed most of his medications. It is difficult to determine if there are any medications that could be contributing to her lymphocytosis.  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  All questions were answered. The patient knows to call the clinic with any problems, questions or concerns.    Rulon Eisenmenger, MD 02/24/17

## 2017-02-25 ENCOUNTER — Other Ambulatory Visit: Payer: 59

## 2017-02-25 ENCOUNTER — Ambulatory Visit (HOSPITAL_BASED_OUTPATIENT_CLINIC_OR_DEPARTMENT_OTHER): Payer: 59

## 2017-02-25 DIAGNOSIS — D72820 Lymphocytosis (symptomatic): Secondary | ICD-10-CM

## 2017-02-26 ENCOUNTER — Other Ambulatory Visit (HOSPITAL_COMMUNITY)
Admission: RE | Admit: 2017-02-26 | Discharge: 2017-02-26 | Disposition: A | Discharge status: Home / Self Care | Payer: 59 | Source: Ambulatory Visit | Attending: Hematology and Oncology | Admitting: Hematology and Oncology

## 2017-02-26 DIAGNOSIS — D72820 Lymphocytosis (symptomatic): Secondary | ICD-10-CM | POA: Diagnosis not present

## 2017-02-26 DIAGNOSIS — D649 Anemia, unspecified: Secondary | ICD-10-CM | POA: Diagnosis not present

## 2017-02-28 LAB — FLOW CYTOMETRY

## 2017-03-03 MED FILL — VICTOZA 18 MG/3 ML INJECT P: 18 | 30 days supply | Qty: 9 | Fill #2

## 2017-03-06 ENCOUNTER — Encounter: Payer: Self-pay | Admitting: Hematology and Oncology

## 2017-03-06 ENCOUNTER — Encounter: Payer: Self-pay | Admitting: Family Medicine

## 2017-03-06 ENCOUNTER — Ambulatory Visit (HOSPITAL_BASED_OUTPATIENT_CLINIC_OR_DEPARTMENT_OTHER): Payer: 59 | Admitting: Hematology and Oncology

## 2017-03-06 DIAGNOSIS — D72820 Lymphocytosis (symptomatic): Secondary | ICD-10-CM

## 2017-03-06 NOTE — Assessment & Plan Note (Signed)
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 This has been going on for the past 3 years since 2015. Patient is completely asymptomatic.  Differential diagnosis of lymphocytosis: 1. Viral illnesses 2. Medications 3. CLL or Benign monoclonal B-cell lymphocytosis: Flow cytometry is benign Recommendation: There is no concern for CLL.  Return to clinic on an as-needed basis

## 2017-03-06 NOTE — Progress Notes (Signed)
Patient Care Team: [name], MD as PCP - General (Family Medicine) Jacob Moores, [facility] as [facility] (Pharmacist)  DIAGNOSIS:  Encounter Diagnosis  Name Primary?  . 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 The flow cytometry did not reveal any monoclonal B-cell or T-cell abnormality. This rules out CLL or even monoclonal B-cell lymphocytosis. She does not have any symptoms of CLL.  REVIEW OF SYSTEMS:   Constitutional: Denies fevers, chills or abnormal weight loss Eyes: Denies blurriness of vision Ears, nose, mouth, throat, and face: Denies mucositis or sore throat Respiratory: Denies cough, dyspnea or wheezes Cardiovascular: Denies palpitation, chest discomfort Gastrointestinal:  Denies nausea, heartburn or change in bowel habits Skin: Denies abnormal skin rashes Lymphatics: Denies new lymphadenopathy or easy bruising Neurological:Denies numbness, tingling or new weaknesses Behavioral/Psych: Mood is stable, no new changes  Extremities: No lower extremity edema Breast:  denies any pain or lumps or nodules in either breasts All other systems were reviewed with the patient and are negative.  I have reviewed the past medical history, past surgical history, social history and family history with the patient and they are unchanged from previous note.  ALLERGIES:  is allergic to fish oil.  MEDICATIONS:  Current Outpatient Prescriptions  Medication Sig Dispense Refill  . acyclovir (ZOVIRAX) 400 MG tablet TAKE 1 TABLET BY MOUTH 2 TIMES DAILY. 180 tablet 3  . albuterol (PROAIR HFA) 108 (90 Base) MCG/ACT inhaler Inhale 2 puffs into the lungs every 6 (six) hours as needed. 18 g 1  . ALPRAZolam (XANAX) 0.5 MG tablet TAKE 1 TABLET BY MOUTH ONCE DAILY AS NEEDED FOR ANXIETY  30 tablet 0  . AMBULATORY NON FORMULARY MEDICATION Reported on 10/26/2015    . ANUCORT-HC 25 MG suppository PLACE 1 SUPPOSITORY RECTALLY 2 TIMES DAILY AS NEEDED FOR HEMORRHOIDS. 25 suppository 5  . aspirin EC 81 MG tablet Take 81 mg by mouth daily.    [name] azelastine (ASTELIN) 0.1 % nasal spray PLACE 2 SPRAYS INTO BOTH NOSTRILS 2 TIMES DAILY AS DIRECTED 90 mL 3  . azelastine (OPTIVAR) 0.05 % ophthalmic solution Place 1 drop into both eyes daily.   3  . BIOTIN 5000 PO Take by mouth.    [name] buPROPion (WELLBUTRIN SR) 100 MG 12 hr tablet Take 1 tablet (100 mg total) by mouth daily. 90 tablet 3  . Calcium Carbonate-Vitamin D (CALCIUM + D PO) Take 1 tablet by mouth daily.    . canagliflozin (INVOKANA) 300 MG TABS tablet Take 1 tablet (300 mg total) by mouth daily before breakfast. 90 tablet 3  . cetirizine (ZYRTEC) 10 MG tablet Take 10 mg by mouth daily.    [name] diltiazem 2 % GEL Apply 1 application topically 5 (five) times daily. (Patient taking differently: Apply 1 application topically as needed. ) 1 Package 3  . docusate sodium (COLACE) 100 MG capsule Take 100 mg by mouth as needed. Reported on 05/04/2016    . ferrous sulfate 325 (65 FE) MG EC tablet Take 325 mg by mouth daily.     . fluticasone (FLONASE) 50 MCG/ACT nasal spray PLACE 2 SPRAYS INTO BOTH NOSTRILS DAILY. 48 g 3  . gabapentin (NEURONTIN) 300 MG capsule Take 1 capsule (300 mg total) by mouth 3 (three) times daily. 270 capsule 1  . glipiZIDE (GLIPIZIDE  XL) 10 MG 24 hr tablet TAKE 1 TABLET BY MOUTH DAILY WITH BREAKFAST. 90 tablet 3  . Glucosamine HCl 1000 MG TABS Take 1,000 mg by mouth 2 (two) times daily with a meal.     . glucose blood (TRUE METRIX BLOOD GLUCOSE TEST) test strip 1 each by Other route 2 (two) times daily as needed for other. Use as instructed    . hydrocortisone 2.5 % ointment Apply topically 2 (two) times daily. (Patient taking differently: Apply topically as needed. ) 30 g 0  . Insulin Pen Needle (PEN NEEDLES) 31G X 5 MM MISC 1  each by Does not apply route daily. USE TO INJECT VICTOZA DAILY AS DIRECTED 100 each 3  . liraglutide 18 MG/3ML SOPN Inject 0.3 mLs (1.8 mg total) into the skin daily. 6 mL 11  . lisinopril (PRINIVIL,ZESTRIL) 5 MG tablet Take 1 tablet (5 mg total) by mouth daily. 90 tablet 3  . metFORMIN (GLUCOPHAGE) 1000 MG tablet Take 1 tablet (1,000 mg total) by mouth 2 (two) times daily with a meal. 180 tablet 3  . montelukast (SINGULAIR) 10 MG tablet Take 1 tablet (10 mg total) by mouth at bedtime. 90 tablet 3  . Multiple Vitamin (MULTIVITAMIN) capsule Take 1 capsule by mouth daily.    . nitroGLYCERIN (NITROSTAT) 0.4 MG SL tablet Place 1 tablet (0.4 mg total) under the tongue every 5 (five) minutes as needed for chest pain. 20 tablet 0  . pantoprazole (PROTONIX) 40 MG tablet TAKE 1 TABLET BY MOUTH 2 TIMES DAILY. 180 tablet 3  . rOPINIRole (REQUIP) 1 MG tablet TAKE 1 TABLET BY MOUTH EVERY NIGHT AT BEDTIME. 90 tablet 3  . sertraline (ZOLOFT) 100 MG tablet TAKE 1 & 1/2 TABLET BY MOUTH DAILY 135 tablet 3  . simvastatin (ZOCOR) 40 MG tablet TAKE 1 TABLET BY MOUTH DAILY AT 6 PM. 90 tablet 3  . TRUEPLUS LANCETS 30G MISC USE TO CHECK BLOOD GLUCOSE DAILY AS DIRECTED 100 each 3   No current facility-administered medications for this visit.    Facility-Administered Medications Ordered in Other Visits  Medication Dose Route Frequency Provider Last Rate Last Dose  . technetium tetrofosmin (TC-MYOVIEW) injection 62.1 millicurie  30.8 millicurie Intravenous Once PRN Larey Dresser, MD        PHYSICAL EXAMINATION: ECOG PERFORMANCE STATUS: 1 - Symptomatic but completely ambulatory  Vitals:   03/06/17 1527  BP: 118/73  Pulse: 98  Resp: 18  Temp: 98 F (36.7 C)   Filed Weights   03/06/17 1527  Weight: 224 lb 9.6 oz (101.9 kg)    GENERAL:alert, no distress and comfortable SKIN: skin color, texture, turgor are normal, no rashes or significant lesions EYES: normal, Conjunctiva are pink and non-injected, sclera  clear OROPHARYNX:no exudate, no erythema and lips, buccal mucosa, and tongue normal  NECK: supple, thyroid normal size, non-tender, without nodularity LYMPH:  no palpable lymphadenopathy in the cervical, axillary or inguinal LUNGS: clear to auscultation and percussion with normal breathing effort HEART: regular rate & rhythm and no murmurs and no lower extremity edema ABDOMEN:abdomen soft, non-tender and normal bowel sounds MUSCULOSKELETAL:no cyanosis of digits and no clubbing  NEURO: alert & oriented x 3 with fluent speech, no focal motor/sensory deficits EXTREMITIES: No lower extremity edema  LABORATORY DATA:  I have reviewed the data as listed   Chemistry      Component Value Date/Time   NA 141 02/12/2017 1052   K 4.3 02/12/2017 1052   CL 101 02/12/2017 1052   CO2  23 02/12/2017 1052   BUN 15 02/12/2017 1052   CREATININE 0.79 02/12/2017 1052   CREATININE 0.81 08/27/2016 1157      Component Value Date/Time   CALCIUM 9.6 02/12/2017 1052   ALKPHOS 85 02/12/2017 1052   AST 31 02/12/2017 1052   ALT 49 (H) 02/12/2017 1052   BILITOT 0.2 02/12/2017 1052       Lab Results  Component Value Date   WBC 11.5 (H) 02/12/2017   HGB 13.7 12/12/2016   HCT 41.8 02/12/2017   MCV 85 02/12/2017   PLT 332 02/12/2017   NEUTROABS 6.2 02/12/2017    ASSESSMENT & PLAN:  Lymphocytosis 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 This has been going on for the past 3 years since 2015. Patient is completely asymptomatic.  Differential diagnosis of lymphocytosis: 1. Viral illnesses 2. Medications 3. CLL or Benign monoclonal B-cell lymphocytosis: Flow cytometry is benign Recommendation: There is no concern for CLL.  Return to clinic on an as-needed basis  I spent 15 minutes talking to the patient of which more than half was spent in counseling and coordination of care.  No orders of the defined types were placed in this encounter.  The patient has a good understanding of the  overall plan. she agrees with it. she will call with any problems that may develop before the next visit here.   Rulon Eisenmenger, MD 03/06/17

## 2017-03-14 ENCOUNTER — Ambulatory Visit (INDEPENDENT_AMBULATORY_CARE_PROVIDER_SITE_OTHER): Payer: 59 | Admitting: Specialist

## 2017-03-14 ENCOUNTER — Encounter (INDEPENDENT_AMBULATORY_CARE_PROVIDER_SITE_OTHER): Payer: Self-pay | Admitting: Specialist

## 2017-03-14 VITALS — BP 102/61 | HR 83 | Temp 98.7°F | Wt 225.0 lb

## 2017-03-14 DIAGNOSIS — M48062 Spinal stenosis, lumbar region with neurogenic claudication: Secondary | ICD-10-CM

## 2017-03-14 DIAGNOSIS — M4316 Spondylolisthesis, lumbar region: Secondary | ICD-10-CM

## 2017-03-14 DIAGNOSIS — M5136 Other intervertebral disc degeneration, lumbar region: Secondary | ICD-10-CM

## 2017-03-14 MED ORDER — DICLOFENAC POTASSIUM 50 MG PO TABS: 50.0000 mg | ORAL_TABLET | Freq: Three times a day (TID) | ORAL | 3 refills | Status: DC

## 2017-03-14 MED FILL — DICLOFENAC POT 50 MG TABLET: 50 | 30 days supply | Qty: 90 | Fill #0

## 2017-03-14 NOTE — Progress Notes (Signed)
Office Visit Note   Patient: Cristina [name]           Date of Birth: 1961/05/04           MRN: [id number] Visit Date: 03/14/2017              Requested by: [name], MD [address] [place], [place] [address] PCP: Reginia Forts, MD   Assessment & Plan: Visit Diagnoses:  1. Spinal stenosis of lumbar region with neurogenic claudication   2. Spondylolisthesis, lumbar region   3. Degenerative disc disease, lumbar     Plan:Avoid bending, stooping and avoid lifting weights greater than 10 lbs. Avoid prolong standing and walking. Avoid frequent bending and stooping  No lifting greater than 10 lbs. May use ice or moist heat for pain. Weight loss is of benefit. Handicap license is approved. Try diclofenac 50 mg up to three times per day with meal or snack for arthritis pain.   Follow-Up Instructions: Return in about 6 weeks (around 04/25/2017).   Orders:  No orders of the defined types were placed in this encounter.  No orders of the defined types were placed in this encounter.     Procedures: No procedures performed   Clinical Data: Findings:  Lumbar  Radiographs with Lumbar spondylolisthesis L4-5 worse with extension to 5-6 mm with flexion at about 77m.    Subjective: Chief Complaint  Patient presents with  . Lower Back - Pain  . other    ROV, had facet injection on 02/17/17 wiht [place], is in pain scale [id number]   56year old female with persistant pain into her lower back about "5" of 10. Pain not keeping her up at night, stiffness getting up in the AM. Tried facet blocks and ESIs without much Relief. She has also been to PT for therapy. Has lost 8 lbs and is going to see a dietician. Using right knee sleeve, history of right knee dislocation of patella? No numbness or tingling. Sitting work mostly at [employer] [place] Does chart preparation in the office. Presently taking gabapentin but not  NSAIDs. In the past meloxicam did help too much. Not really  exercising, just been dieting.    Review of Systems  Constitutional: Negative.   HENT: Negative.   Eyes: Negative.   Respiratory: Negative.   Cardiovascular: Negative.   Gastrointestinal: Negative.   Endocrine: Negative.   Genitourinary: Negative.   Musculoskeletal: Negative.   Skin: Negative.   Allergic/Immunologic: Negative.   Neurological: Negative.   Hematological: Negative.   Psychiatric/Behavioral: Negative.      Objective: Vital Signs: BP 102/61 (BP Location: Left Arm)   Pulse 83   Temp 98.7 F (37.1 C) (Oral)   Wt 225 lb (102.1 kg)   BMI 42.51 kg/m   Physical Exam  Constitutional: She is oriented to person, place, and time. She appears well-developed and well-nourished.  HENT:  Head: Normocephalic and atraumatic.  Eyes: EOM are normal. Pupils are equal, round, and reactive to light.  Neck: Normal range of motion. Neck supple.  Pulmonary/Chest: Effort normal and breath sounds normal.  Abdominal: Soft. Bowel sounds are normal.  Neurological: She is alert and oriented to person, place, and time.  Skin: Skin is warm and dry.  Psychiatric: She has a normal mood and affect. Her behavior is normal. Judgment and thought content normal.    Back Exam   Tenderness  The patient is experiencing tenderness in the lumbar.  Range of Motion  Extension: abnormal  Flexion: normal   Muscle Strength  Right Quadriceps:  5/5  Left Quadriceps:  5/5  Right Hamstrings:  5/5  Left Hamstrings:  5/5   Tests  Straight leg raise right: negative Straight leg raise left: negative  Reflexes  Patellar: 0/4 Achilles: 0/4 Babinski's sign: normal   Other  Toe Walk: normal Heel Walk: normal Gait: normal  Erythema: no back redness Scars: absent      Specialty Comments:  No specialty comments available.  Imaging: No results found.   PMFS History: Patient Active Problem List   Diagnosis Date Noted  . Lymphocytosis 02/24/2017  . Essential hypertension 02/12/2017   . Mild intermittent asthma with acute exacerbation 12/12/2016  . Left shoulder pain 06/02/2015  . Insomnia 05/12/2014  . Fibromyalgia 05/12/2014  . Allergic rhinitis 03/28/2014  . Osteoarthritis 12/20/2013  . Cervical strain 06/21/2013  . Low back pain 06/21/2013  . Metatarsalgia of both feet 03/11/2013  . Chest pain 01/25/2013  . Sciatica 01/25/2013  . Restless leg syndrome 11/24/2012  . Pure hypercholesterolemia 10/20/2012  . OSA on CPAP 10/20/2012  . Depression 10/20/2012  . Iron deficiency anemia, unspecified 10/01/2012  . Diabetes mellitus, type 2 ([place]) 12/31/2010  . ANAL FISSURE 12/31/2010  . Morbid obesity ([place]) 01/08/2007  . CONSTIPATION 01/08/2007   Past Medical History:  Diagnosis Date  . Allergic rhinitis, cause unspecified   . Arthritis   . Constipation   . CTS (carpal tunnel syndrome)   . Cystocele   . Depression   . Diabetes mellitus   . Dysfunction of eustachian tube   . Fatty liver   . Fissure, anal   . Genital herpes   . GERD (gastroesophageal reflux disease)   . Hemorrhoid   . Hx of migraine headaches   . Hyperlipidemia   . Hypertension   . Insomnia   . Iron deficiency anemia, unspecified   . Obesity   . OSA on CPAP   . Rectocele     Family History  Problem Relation Age of Onset  . Hypertension Mother 17  . Osteoarthritis Mother   . Diabetes Mother   . Colon cancer Mother 88  . Cancer Mother 55    colon cancer  . Colon cancer Paternal Grandmother   . Cancer Paternal Grandmother   . Stroke Maternal Grandmother   . Colon polyps Brother     x 2 brother  . Diabetes Daughter   . Hypertension Daughter   . Sleep apnea Daughter   . Mental illness Daughter     depression  . Migraines Daughter   . Colon polyps Brother   . Cirrhosis Brother     alcoholism  . Alcohol abuse Brother   . Migraines Daughter   . Mental illness Daughter     anxiety attacks  . Protein S deficiency Daughter   . Sleep apnea Brother   . Hyperlipidemia Brother     . Colon cancer Paternal Aunt     Past Surgical History:  Procedure Laterality Date  . ABDOMINAL HYSTERECTOMY  11/11/2010   Marvel Plan.  Ovaries intact.  Uterine fibroids with DUB.  [name] CARPAL TUNNEL RELEASE  1989   Left  . CHOLECYSTECTOMY    . CHOLECYSTECTOMY    . COLONOSCOPY  06/29/2012   normal.  Repeat in 5 years.  [name] [place]  . ESOPHAGOGASTRODUODENOSCOPY  12/13/2011   dysphagia.  Henrene Pastor.  Normal.  . Sleep study  09/11/2012   severe OSA.  CPAP titration at 12 cm water pressure.  [name]  TUBAL LIGATION  1988   Social History   Occupational History  . [employer]   Social History Main Topics  . Smoking status: Former Research scientist (life sciences)  . Smokeless tobacco: Never Used     Comment: smoked occasionally longest 6 mos.  . Alcohol use No  . Drug use: No  . Sexual activity: No

## 2017-03-14 NOTE — Patient Instructions (Addendum)
Avoid bending, stooping and avoid lifting weights greater than 10 lbs. Avoid prolong standing and walking. Avoid frequent bending and stooping  No lifting greater than 10 lbs. May use ice or moist heat for pain. Weight loss is of benefit. Handicap license is approved. Try diclofenac 50 mg up to three times per day with meal or snack for arthritis pain.

## 2017-03-19 NOTE — Telephone Encounter (Signed)
I have finally found the number where to send it. I faxed it off and got a confirmation on the fax

## 2017-03-24 MED FILL — ACYCLOVIR 400 MG TABLET: 400 | 90 days supply | Qty: 180 | Fill #0

## 2017-03-24 MED FILL — SIMVASTATIN 40 MG TABLET: 40 | 90 days supply | Qty: 90 | Fill #0

## 2017-03-24 MED FILL — glipiZIDE XL 10 MG TB24: 10 | 90 days supply | Qty: 90 | Fill #3

## 2017-03-24 MED FILL — BUPROPION HCL SR 100 MG TAB: 100 | 90 days supply | Qty: 90 | Fill #3

## 2017-03-24 MED FILL — GABAPENTIN 300 MG CAPSULE: 300 | 90 days supply | Qty: 270 | Fill #1

## 2017-03-27 ENCOUNTER — Encounter (INDEPENDENT_AMBULATORY_CARE_PROVIDER_SITE_OTHER): Payer: 59 | Admitting: Family Medicine

## 2017-03-28 ENCOUNTER — Encounter: Payer: 59 | Admitting: Physician Assistant

## 2017-03-30 NOTE — Progress Notes (Signed)
No show--erroneous encounter

## 2017-03-31 ENCOUNTER — Encounter: Payer: Self-pay | Admitting: Family Medicine

## 2017-04-01 ENCOUNTER — Encounter: Payer: Self-pay | Admitting: Gastroenterology

## 2017-04-01 ENCOUNTER — Ambulatory Visit (INDEPENDENT_AMBULATORY_CARE_PROVIDER_SITE_OTHER): Payer: 59 | Admitting: Gastroenterology

## 2017-04-01 VITALS — BP 110/72 | HR 86 | Ht 61.0 in | Wt 230.2 lb

## 2017-04-01 DIAGNOSIS — Z800 Family history of malignant neoplasm of digestive organs: Secondary | ICD-10-CM | POA: Diagnosis not present

## 2017-04-01 DIAGNOSIS — Z791 Long term (current) use of non-steroidal anti-inflammatories (NSAID): Secondary | ICD-10-CM | POA: Diagnosis not present

## 2017-04-01 DIAGNOSIS — R110 Nausea: Secondary | ICD-10-CM | POA: Diagnosis not present

## 2017-04-01 DIAGNOSIS — R1012 Left upper quadrant pain: Secondary | ICD-10-CM | POA: Diagnosis not present

## 2017-04-01 DIAGNOSIS — R197 Diarrhea, unspecified: Secondary | ICD-10-CM

## 2017-04-01 DIAGNOSIS — K580 Irritable bowel syndrome with diarrhea: Secondary | ICD-10-CM | POA: Diagnosis not present

## 2017-04-01 MED ORDER — DILTIAZEM GEL 2 %: 1.0000 "application " | Freq: Three times a day (TID) | CUTANEOUS | 3 refills | Status: AC

## 2017-04-01 MED ORDER — SUCRALFATE 1 G PO TABS: ORAL_TABLET | ORAL | 0 refills | Status: DC

## 2017-04-01 MED ORDER — NA SULFATE-K SULFATE-MG SULF 17.5-3.13-1.6 GM/177ML PO SOLN: 1.0000 | Freq: Once | ORAL | 0 refills | Status: AC

## 2017-04-01 MED FILL — SUCRALFATE 1 GM TABLET: 1 | 14 days supply | Qty: 56 | Fill #0

## 2017-04-01 MED FILL — SUPREP BOWEL PREP KIT: 17.5-3.13-1 | 1 days supply | Qty: 354 | Fill #0

## 2017-04-01 NOTE — Progress Notes (Signed)
04/01/2017 Cristina [name] [id number] October 12, 1961   HISTORY OF PRESENT ILLNESS:  This is a 56 year old female who is known to Dr. Henrene Pastor for treatment of her IBS, GERD.  She also has a family history of colon cancer so gets colonoscopies every 5 years. Her last colonoscopy was in August 2013 at which time it was normal. She presents to the office today with complaints of left upper quadrant and some epigastric abdominal pain along with nausea and diarrhea.  She says that all the symptoms started 10 days ago. Prior to that she was doing well. She says that she typically gets diarrhea with her irritable bowel, but not usually abdominal pain. She says she is having about 4-5 bowel movements daily. She also complains of some bloating. No vomiting. She takes pantoprazole 40 mg twice daily for reflux issues. Her only new medication is diclofenac, which she is taking twice daily for back pain. She began this on May 4. She says that it has not even been really helping her back.  Fairly recent CBC, CMP, TSH are unremarkable.  She says that she still has issues with anal fissure on and off and is asking for refills on her diltiazem gel.   Past Medical History:  Diagnosis Date  . Allergic rhinitis, cause unspecified   . Arthritis   . Constipation   . CTS (carpal tunnel syndrome)   . Cystocele   . Depression   . Diabetes mellitus   . Dysfunction of eustachian tube   . Fatty liver   . Fissure, anal   . Genital herpes   . GERD (gastroesophageal reflux disease)   . Hemorrhoid   . Hx of migraine headaches   . Hyperlipidemia   . Hypertension   . Insomnia   . Iron deficiency anemia, unspecified   . Obesity   . OSA on CPAP   . Rectocele    Past Surgical History:  Procedure Laterality Date  . ABDOMINAL HYSTERECTOMY  11/11/2010   [facility] Plan.  Ovaries intact.  Uterine fibroids with DUB.  [name] CARPAL TUNNEL RELEASE  1989   Left  . CHOLECYSTECTOMY    . CHOLECYSTECTOMY    . COLONOSCOPY  06/29/2012   normal.  Repeat in 5 years.  [name] [place]  . ESOPHAGOGASTRODUODENOSCOPY  12/13/2011   dysphagia.  Henrene Pastor.  Normal.  . Sleep study  09/11/2012   severe OSA.  CPAP titration at 12 cm water pressure.  . TUBAL LIGATION  1988    reports that she has quit smoking. She has never used smokeless tobacco. She reports that she does not drink alcohol or use drugs. family history includes Alcohol abuse in her brother; Cancer in her paternal grandmother; Cancer (age of onset: 15) in her mother; Cirrhosis in her brother; Colon cancer in her paternal aunt and paternal grandmother; Colon cancer (age of onset: 20) in her mother; Colon polyps in her brother and brother; Diabetes in her daughter and mother; Hyperlipidemia in her brother; Hypertension in her daughter; Hypertension (age of onset: 68) in her mother; Mental illness in her daughter and daughter; Migraines in her daughter and daughter; Osteoarthritis in her mother; Protein S deficiency in her daughter; Sleep apnea in her brother and daughter; Stroke in her maternal grandmother. Allergies  Allergen Reactions  . Fish Oil Rash      Outpatient Encounter Prescriptions as of 04/01/2017  Medication Sig  . acyclovir (ZOVIRAX) 400 MG tablet TAKE 1 TABLET BY MOUTH 2 TIMES DAILY.  [name]  albuterol (PROAIR HFA) 108 (90 Base) MCG/ACT inhaler Inhale 2 puffs into the lungs every 6 (six) hours as needed.  . ALPRAZolam (XANAX) 0.5 MG tablet TAKE 1 TABLET BY MOUTH ONCE DAILY AS NEEDED FOR ANXIETY  . AMBULATORY NON FORMULARY MEDICATION Reported on 10/26/2015  . ANUCORT-HC 25 MG suppository PLACE 1 SUPPOSITORY RECTALLY 2 TIMES DAILY AS NEEDED FOR HEMORRHOIDS.  [name] aspirin EC 81 MG tablet Take 81 mg by mouth daily.  [name] azelastine (ASTELIN) 0.1 % nasal spray PLACE 2 SPRAYS INTO BOTH NOSTRILS 2 TIMES DAILY AS DIRECTED  . azelastine (OPTIVAR) 0.05 % ophthalmic solution Place 1 drop into both eyes daily. PRN  . BIOTIN 5000 PO Take by mouth.  [name] buPROPion (WELLBUTRIN  SR) 100 MG 12 hr tablet Take 1 tablet (100 mg total) by mouth daily.  . Calcium Carbonate-Vitamin D (CALCIUM + D PO) Take 1 tablet by mouth daily.  . canagliflozin (INVOKANA) 300 MG TABS tablet Take 1 tablet (300 mg total) by mouth daily before breakfast.  . cetirizine (ZYRTEC) 10 MG tablet Take 10 mg by mouth daily.  . diclofenac (CATAFLAM) 50 MG tablet Take 1 tablet (50 mg total) by mouth 3 (three) times daily.  [name] diltiazem 2 % GEL Apply 1 application topically 5 (five) times daily. (Patient taking differently: Apply 1 application topically as needed. )  . docusate sodium (COLACE) 100 MG capsule Take 100 mg by mouth as needed. Reported on 05/04/2016  . ferrous sulfate 325 (65 FE) MG EC tablet Take 325 mg by mouth daily.   . fluticasone (FLONASE) 50 MCG/ACT nasal spray PLACE 2 SPRAYS INTO BOTH NOSTRILS DAILY.  [name] gabapentin (NEURONTIN) 300 MG capsule Take 1 capsule (300 mg total) by mouth 3 (three) times daily.  [name] glipiZIDE (GLIPIZIDE XL) 10 MG 24 hr tablet TAKE 1 TABLET BY MOUTH DAILY WITH BREAKFAST.  [name] Glucosamine HCl 1000 MG TABS Take 1,000 mg by mouth 2 (two) times daily with a meal.   . glucose blood (TRUE METRIX BLOOD GLUCOSE TEST) test strip 1 each by Other route 2 (two) times daily as needed for other. Use as instructed  . hydrocortisone 2.5 % ointment Apply topically 2 (two) times daily. (Patient taking differently: Apply topically as needed. )  . Insulin Pen Needle (PEN NEEDLES) 31G X 5 MM MISC 1 each by Does not apply route daily. USE TO INJECT VICTOZA DAILY AS DIRECTED  . liraglutide 18 MG/3ML SOPN Inject 0.3 mLs (1.8 mg total) into the skin daily.  [name] lisinopril (PRINIVIL,ZESTRIL) 5 MG tablet Take 1 tablet (5 mg total) by mouth daily.  . metFORMIN (GLUCOPHAGE) 1000 MG tablet Take 1 tablet (1,000 mg total) by mouth 2 (two) times daily with a meal.  . montelukast (SINGULAIR) 10 MG tablet Take 1 tablet (10 mg total) by mouth at bedtime.  . Multiple Vitamin (MULTIVITAMIN) capsule Take 1  capsule by mouth daily.  . nitroGLYCERIN (NITROSTAT) 0.4 MG SL tablet Place 1 tablet (0.4 mg total) under the tongue every 5 (five) minutes as needed for chest pain.  . pantoprazole (PROTONIX) 40 MG tablet TAKE 1 TABLET BY MOUTH 2 TIMES DAILY.  [name] rOPINIRole (REQUIP) 1 MG tablet TAKE 1 TABLET BY MOUTH EVERY NIGHT AT BEDTIME.  [name] sertraline (ZOLOFT) 100 MG tablet TAKE 1 & 1/2 TABLET BY MOUTH DAILY  . simvastatin (ZOCOR) 40 MG tablet TAKE 1 TABLET BY MOUTH DAILY AT 6 PM.  . TRUEPLUS LANCETS 30G MISC USE TO CHECK BLOOD GLUCOSE DAILY AS DIRECTED   Facility-Administered  Encounter Medications as of 04/01/2017  Medication  . technetium tetrofosmin (TC-MYOVIEW) injection 46.8 millicurie     REVIEW OF SYSTEMS  : All other systems reviewed and negative except where noted in the History of Present Illness.   PHYSICAL EXAM: BP 110/72   Pulse 86   Ht 5\' 1"  (1.549 m)   Wt 230 lb 3.2 oz (104.4 kg)   BMI 43.50 kg/m  General: Well developed female in no acute distress Head: Normocephalic and atraumatic Eyes:  Sclerae anicteric, conjunctiva pink. Ears: Normal auditory acuity Lungs: Clear throughout to auscultation Heart: Regular rate and rhythm Abdomen: Soft, non-distended.  Normal bowel sounds.  Mild epigastric and LUQ abdominal TTP. Musculoskeletal: Symmetrical with no gross deformities  Skin: No lesions on visible extremities Extremities: No edema  Neurological: Alert oriented x 4, grossly non-focal Psychological:  Alert and cooperative. Normal mood and affect  ASSESSMENT AND PLAN: -LUQ abdominal pain, nausea:  Started diclofenac on 5/4.  ? If related to that.  I have asked her to discontinue the diclofenac since it is not helping her back anyway.  Will give carafate tablet to take ACHS for a couple of weeks to see if that helps. -Diarrhea:  Has IBS as well, but it could also be due to the diclofenac.  Imodium prn. -Family history of colon cancer:  Due for colonoscopy in August.  She would like  to go ahead and schedule it while she is here. 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  Would like to refill her diltiazem gel.  *Will call back for ongoing or worsening symptoms.   CC:  [name], MD

## 2017-04-01 NOTE — Progress Notes (Signed)
Physician assistant assessment and plans reviewed

## 2017-04-01 NOTE — Patient Instructions (Addendum)
Take Imodium as needed.  Discontinue the Diclofenac.   We sent a prescription refill to [facility] for the Diltiazem gel 2 %.  We sent the colonoscopy prep and the carafate tablets to [facility],    You have been scheduled for a colonoscopy. Please follow written instructions given to you at your visit today.  Please pick up your prep supplies at the pharmacy within the next 1-3 days.  If you use inhalers (even only as needed), please bring them with you on the day of your procedure. Your physician has requested that you go to www.startemmi.com and enter the access code given to you at your visit today. This web site gives a general overview about your procedure. However, you should still follow specific instructions given to you by our office regarding your preparation for the procedure.

## 2017-04-02 ENCOUNTER — Telehealth: Payer: Self-pay | Admitting: Family Medicine

## 2017-04-02 MED ORDER — TRUEPLUS LANCETS 30G MISC: 3 refills | Status: DC

## 2017-04-02 MED ORDER — GLUCOSE BLOOD VI STRP: ORAL_STRIP | 1 refills | Status: DC

## 2017-04-02 MED ORDER — GLUCOSE BLOOD VI STRP: 1.0000 | ORAL_STRIP | Freq: Two times a day (BID) | 3 refills | Status: DC | PRN

## 2017-04-02 NOTE — Telephone Encounter (Signed)
Pt is checking on status of a refill request for lancets and test strips  Best number [phone]

## 2017-04-03 MED FILL — ACCU-CHEK GUIDE TEST STRIP: 30 days supply | Qty: 100 | Fill #0

## 2017-04-03 MED FILL — ACCU-CHEK FASTCLIX LANCETS: 30 days supply | Qty: 102 | Fill #0

## 2017-04-04 MED FILL — UNIFINE PENTIPS 31GX3/16: 31G X 5 MM | 90 days supply | Qty: 100 | Fill #3

## 2017-04-04 MED FILL — UNIFINE PENTIPS 31GX3/16": 31G X 5 MM | 90 days supply | Qty: 100 | Fill #3

## 2017-04-04 MED FILL — VICTOZA 18 MG/3 ML INJECT P: 18 | 30 days supply | Qty: 9 | Fill #3

## 2017-04-16 ENCOUNTER — Telehealth (INDEPENDENT_AMBULATORY_CARE_PROVIDER_SITE_OTHER): Payer: Self-pay | Admitting: Radiology

## 2017-04-16 NOTE — Telephone Encounter (Signed)
Patient is calling, patient states there is a problem in her paperwork. She states 2 events, not 3 events. She states the events need to be 3 for FMLA to cover. She states she never knows when the events may occur. She works for [employer] as well. She was unable to pay [employer] and said these were filled out by you. She would like a call back as soon as possible to discuss on mobile number.

## 2017-04-16 NOTE — Telephone Encounter (Signed)
I called and advised pt of the change on her form.   She is now requesting a note or something for her employer that she needs a new chair due to the one she is using is part of her back problem.  She needs an Printmaker 2" for lumbar support and needs the back and seat of the chair needs to be able to tilt.

## 2017-04-18 NOTE — Telephone Encounter (Signed)
I believe that for her condition a chair with arm rests and a high back that is adequate, I don't thing a special ergonomic chair is indicated.I believe that the FLMA form as filled out is adequate, if she were the one filling out the form she may be able to expect differently, we do not plan to pay the costs of changing her form to meet her requests. jen

## 2017-04-18 NOTE — Telephone Encounter (Signed)
I called and lmom advising patient of the message from Dr. Louanne Skye denying her a Ergo Chair

## 2017-04-23 ENCOUNTER — Encounter (INDEPENDENT_AMBULATORY_CARE_PROVIDER_SITE_OTHER): Payer: Self-pay | Admitting: Family Medicine

## 2017-04-23 ENCOUNTER — Ambulatory Visit (INDEPENDENT_AMBULATORY_CARE_PROVIDER_SITE_OTHER): Payer: 59 | Admitting: Family Medicine

## 2017-04-23 VITALS — BP 113/72 | HR 84 | Temp 98.6°F | Ht 61.0 in | Wt 224.0 lb

## 2017-04-23 DIAGNOSIS — R5383 Other fatigue: Secondary | ICD-10-CM

## 2017-04-23 DIAGNOSIS — E119 Type 2 diabetes mellitus without complications: Secondary | ICD-10-CM

## 2017-04-23 DIAGNOSIS — Z0289 Encounter for other administrative examinations: Secondary | ICD-10-CM

## 2017-04-23 DIAGNOSIS — R0609 Other forms of dyspnea: Secondary | ICD-10-CM | POA: Diagnosis not present

## 2017-04-23 DIAGNOSIS — E784 Other hyperlipidemia: Secondary | ICD-10-CM

## 2017-04-23 DIAGNOSIS — I10 Essential (primary) hypertension: Secondary | ICD-10-CM | POA: Diagnosis not present

## 2017-04-23 DIAGNOSIS — E7849 Other hyperlipidemia: Secondary | ICD-10-CM

## 2017-04-23 DIAGNOSIS — Z1389 Encounter for screening for other disorder: Secondary | ICD-10-CM | POA: Diagnosis not present

## 2017-04-23 DIAGNOSIS — E559 Vitamin D deficiency, unspecified: Secondary | ICD-10-CM | POA: Diagnosis not present

## 2017-04-23 DIAGNOSIS — Z1331 Encounter for screening for depression: Secondary | ICD-10-CM

## 2017-04-23 DIAGNOSIS — R0600 Dyspnea, unspecified: Secondary | ICD-10-CM

## 2017-04-23 NOTE — Progress Notes (Signed)
Office: [phone]  /  Fax: [phone]   Dear Dr. Tamala Julian,   Thank you for referring Cristina [name] to our clinic. The following note includes my evaluation and treatment recommendations.  HPI:   Chief Complaint: OBESITY  Cristina [name] has been referred by Renette Butters. Tamala Julian, MD for consultation regarding her obesity and obesity related comorbidities.  Cristina [name] (MR# [id number]) is a 56 y.o. female who presents on 04/23/2017 for obesity evaluation and treatment. Current BMI is Body mass index is 42.32 kg/m.[name] Cristina [name] has struggled with obesity for years and has been unsuccessful in either losing weight or maintaining long term weight loss. Cristina [name] attended our information session and states she is currently in the action stage of change and ready to dedicate time achieving and maintaining a healthier weight.  Berenise states her family eats meals together she thinks her family will eat healthier with  her her desired weight loss is 94 to 97 lbs she has been heavy most of  her life she started gaining weight in her 21's her heaviest weight ever was 230 lbs. she has significant food cravings issues  she is frequently drinking liquids with calories she frequently makes poor food choices she has problems with excessive hunger  she frequently eats larger portions than normal  she has binge eating behaviors she struggles with emotional eating    Fatigue Michaelle feels her energy is lower than it should be. This has worsened with weight gain and has not worsened recently. Davielle admits to daytime somnolence and  admits to waking up still tired. Patient is at risk for obstructive sleep apnea. Patent has a history of symptoms of daytime fatigue and hypertension. Patient generally gets 9 hours of sleep per night, and states they generally have restless sleep. Snoring is present. Apneic episodes are not present. Epworth Sleepiness Score is 11  Dyspnea on exertion Cristina [name] notes increasing shortness of breath with  exercising and seems to be worsening over time with weight gain. She notes getting out of breath sooner with activity than she used to. This has not gotten worse recently. Cristina [name] denies orthopnea.  Vitamin D deficiency Cristina [name] has a diagnosis of vitamin D deficiency. She is currently taking vit D and calcium. She admits fatigue and denies nausea, vomiting or muscle weakness.  Diabetes II Cristina [name] has a diagnosis of diabetes type II. Cristina [name] states she has not been checking blood sugars often. Last A1c was elevated at 8.1 She is on multiple medications and has sometimes dropped to 60's in the past. She has been working on intensive lifestyle modifications including diet, exercise, and weight loss to help control her blood glucose levels.  Hypertension Cristina [name] is a 56 y.o. female with hypertension and is on multiple medications.  Cristina [name] admits headaches and denies chest pain. She is working weight loss to help control her blood pressure with the goal of decreasing her risk of heart attack and stroke. [name] blood pressure is currently controlled.  Hyperlipidemia Cristina [name] has hyperlipidemia and is on statin. She is attempting to improve her cholesterol levels with intensive lifestyle modification including a low saturated fat diet, exercise and weight loss. She denies any chest pain, claudication or myalgias.  Depression Screen Cristina [name]'s Food and Mood (modified PHQ-9) score was  Depression screen PHQ 2/9 04/23/2017  Decreased Interest 3  Down, Depressed, Hopeless 3  PHQ - 2 Score 6  Altered sleeping 3  Tired, decreased energy 3  Change in appetite 2  Feeling bad  or failure about yourself  1  Trouble concentrating 1  Moving slowly or fidgety/restless 1  Suicidal thoughts -  PHQ-9 Score 17  Difficult doing work/chores -  Some recent data might be hidden    ALLERGIES: Allergies  Allergen Reactions  . Fish Oil Rash    MEDICATIONS: Current Outpatient Prescriptions on File Prior to Visit    Medication Sig Dispense Refill  . aspirin EC 81 MG tablet Take 81 mg by mouth daily.    [name] azelastine (ASTELIN) 0.1 % nasal spray PLACE 2 SPRAYS INTO BOTH NOSTRILS 2 TIMES DAILY AS DIRECTED 90 mL 3  . BIOTIN 5000 PO Take by mouth.    [name] buPROPion (WELLBUTRIN SR) 100 MG 12 hr tablet Take 1 tablet (100 mg total) by mouth daily. 90 tablet 3  . Calcium Carbonate-Vitamin D (CALCIUM + D PO) Take 1 tablet by mouth daily.    . canagliflozin (INVOKANA) 300 MG TABS tablet Take 1 tablet (300 mg total) by mouth daily before breakfast. 90 tablet 3  . cetirizine (ZYRTEC) 10 MG tablet Take 10 mg by mouth daily.    [name] diltiazem 2 % GEL Apply 1 application topically 3 (three) times daily. 1 Package 3  . docusate sodium (COLACE) 100 MG capsule Take 100 mg by mouth as needed. Reported on 05/04/2016    . ferrous sulfate 325 (65 FE) MG EC tablet Take 325 mg by mouth daily.     . fluticasone (FLONASE) 50 MCG/ACT nasal spray PLACE 2 SPRAYS INTO BOTH NOSTRILS DAILY. 48 g 3  . gabapentin (NEURONTIN) 300 MG capsule Take 1 capsule (300 mg total) by mouth 3 (three) times daily. 270 capsule 1  . glipiZIDE (GLIPIZIDE XL) 10 MG 24 hr tablet TAKE 1 TABLET BY MOUTH DAILY WITH BREAKFAST. 90 tablet 3  . Glucosamine HCl 1000 MG TABS Take 1,000 mg by mouth 2 (two) times daily with a meal.     . glucose blood test strip accucheck guide strips and fast clix lancets Use as instructed dm 2 100 each 1  . hydrocortisone 2.5 % ointment Apply topically 2 (two) times daily. (Patient taking differently: Apply topically as needed. ) 30 g 0  . Insulin Pen Needle (PEN NEEDLES) 31G X 5 MM MISC 1 each by Does not apply route daily. USE TO INJECT VICTOZA DAILY AS DIRECTED 100 each 3  . liraglutide 18 MG/3ML SOPN Inject 0.3 mLs (1.8 mg total) into the skin daily. 6 mL 11  . lisinopril (PRINIVIL,ZESTRIL) 5 MG tablet Take 1 tablet (5 mg total) by mouth daily. 90 tablet 3  . metFORMIN (GLUCOPHAGE) 1000 MG tablet Take 1 tablet (1,000 mg total) by mouth 2  (two) times daily with a meal. 180 tablet 3  . montelukast (SINGULAIR) 10 MG tablet Take 1 tablet (10 mg total) by mouth at bedtime. 90 tablet 3  . Multiple Vitamin (MULTIVITAMIN) capsule Take 1 capsule by mouth daily.    . nitroGLYCERIN (NITROSTAT) 0.4 MG SL tablet Place 1 tablet (0.4 mg total) under the tongue every 5 (five) minutes as needed for chest pain. 20 tablet 0  . pantoprazole (PROTONIX) 40 MG tablet TAKE 1 TABLET BY MOUTH 2 TIMES DAILY. 180 tablet 3  . rOPINIRole (REQUIP) 1 MG tablet TAKE 1 TABLET BY MOUTH EVERY NIGHT AT BEDTIME. 90 tablet 3  . sertraline (ZOLOFT) 100 MG tablet TAKE 1 & 1/2 TABLET BY MOUTH DAILY 135 tablet 3  . simvastatin (ZOCOR) 40 MG tablet TAKE 1 TABLET BY MOUTH DAILY AT  6 PM. 90 tablet 3  . TRUEPLUS LANCETS 30G MISC USE TO CHECK BLOOD GLUCOSE DAILY AS DIRECTED 100 each 3  . acyclovir (ZOVIRAX) 400 MG tablet TAKE 1 TABLET BY MOUTH 2 TIMES DAILY. 180 tablet 3  . albuterol (PROAIR HFA) 108 (90 Base) MCG/ACT inhaler Inhale 2 puffs into the lungs every 6 (six) hours as needed. 18 g 1  . ALPRAZolam (XANAX) 0.5 MG tablet TAKE 1 TABLET BY MOUTH ONCE DAILY AS NEEDED FOR ANXIETY 30 tablet 0  . AMBULATORY NON FORMULARY MEDICATION Reported on 10/26/2015    . ANUCORT-HC 25 MG suppository PLACE 1 SUPPOSITORY RECTALLY 2 TIMES DAILY AS NEEDED FOR HEMORRHOIDS. 25 suppository 5  . azelastine (OPTIVAR) 0.05 % ophthalmic solution Place 1 drop into both eyes daily. PRN  3  . diclofenac (CATAFLAM) 50 MG tablet Take 1 tablet (50 mg total) by mouth 3 (three) times daily. (Patient not taking: Reported on 04/23/2017) 90 tablet 3  . sucralfate (CARAFATE) 1 g tablet Take 1 tab with meals and at bedtime x 14 days. (Patient not taking: Reported on 04/23/2017) 56 tablet 0   Current Facility-Administered Medications on File Prior to Visit  Medication Dose Route Frequency Provider Last Rate Last Dose  . technetium tetrofosmin (TC-MYOVIEW) injection 50.0 millicurie  93.8 millicurie Intravenous Once  PRN Larey Dresser, MD        PAST MEDICAL HISTORY: Past Medical History:  Diagnosis Date  . Allergic rhinitis, cause unspecified   . Anemia   . Arthritis   . Asthma   . Back pain   . Chest pain   . Constipation   . CTS (carpal tunnel syndrome)   . Cystocele   . Depression   . Diabetes mellitus   . Dysfunction of eustachian tube   . Fatty liver   . Fissure, anal   . Gallbladder problem   . Genital herpes   . GERD (gastroesophageal reflux disease)   . Hemorrhoid   . Hx of migraine headaches   . Hyperlipidemia   . Hypertension   . IBS (irritable bowel syndrome)   . Insomnia   . Iron deficiency anemia, unspecified   . Joint pain   . Nausea   . Obesity   . OSA on CPAP   . Osteoarthritis   . Rectocele   . Sleep apnea   . TMJ (dislocation of temporomandibular joint)     PAST SURGICAL HISTORY: Past Surgical History:  Procedure Laterality Date  . ABDOMINAL HYSTERECTOMY  11/11/2010   [place] Plan.  Ovaries intact.  Uterine fibroids with DUB.  [name] CARPAL TUNNEL RELEASE  1989   Left  . CHOLECYSTECTOMY    . CHOLECYSTECTOMY    . COLONOSCOPY  06/29/2012   normal.  Repeat in 5 years.  [name] [place]  . ESOPHAGOGASTRODUODENOSCOPY  12/13/2011   dysphagia.  Henrene Pastor.  Normal.  . Sleep study  09/11/2012   severe OSA.  CPAP titration at 12 cm water pressure.  . TUBAL LIGATION  1988    SOCIAL HISTORY: Social History  Substance Use Topics  . Smoking status: Former Research scientist (life sciences)  . Smokeless tobacco: Never Used     Comment: smoked occasionally longest 6 mos.  . Alcohol use No    FAMILY HISTORY: Family History  Problem Relation Age of Onset  . Hypertension Mother 63  . Osteoarthritis Mother   . Diabetes Mother   . Colon cancer Mother 75  . Cancer Mother 86       colon cancer  .  Hyperlipidemia Mother   . Stroke Mother   . Obesity Mother   . Sudden death Mother   . Colon cancer Paternal Grandmother   . Cancer Paternal Grandmother   . Stroke Maternal  Grandmother   . Colon polyps Brother        x 2 brother  . Diabetes Daughter   . Hypertension Daughter   . Sleep apnea Daughter   . Mental illness Daughter        depression  . Migraines Daughter   . Colon polyps Brother   . Cirrhosis Brother        alcoholism  . Alcohol abuse Brother   . Migraines Daughter   . Mental illness Daughter        anxiety attacks  . Protein S deficiency Daughter   . Sleep apnea Brother   . Hyperlipidemia Brother   . Colon cancer Paternal Aunt     ROS: Review of Systems  Constitutional: Positive for malaise/fatigue.  HENT:       Dry mouth   Eyes:       Wear Glasses or Contacts Floaters  Cardiovascular: Negative for chest pain, orthopnea and claudication.       Very Cold Feet or Hands  Gastrointestinal: Positive for constipation, diarrhea and heartburn. Negative for nausea and vomiting.  Musculoskeletal: Positive for back pain, joint pain (muscle or joint pain) and neck pain. Negative for myalgias.       Negative muscle weakness  Neurological: Positive for headaches.  Endo/Heme/Allergies: Positive for polydipsia.  Psychiatric/Behavioral: Positive for depression. The patient has insomnia.     PHYSICAL EXAM: Blood pressure 113/72, pulse 84, temperature 98.6 F (37 C), temperature source Oral, height 5\' 1"  (1.549 m), weight 224 lb (101.6 kg), SpO2 96 %. Body mass index is 42.32 kg/m. Physical Exam  Constitutional: She is oriented to person, place, and time. She appears well-developed and well-nourished.  Cardiovascular: Normal rate.   Pulmonary/Chest: Effort normal.  Musculoskeletal: Normal range of motion.  Right Knee Crepitus  Neurological: She is oriented to person, place, and time.  Skin: Skin is warm and dry.  Psychiatric: She has a normal mood and affect. Her behavior is normal.  Vitals reviewed.   RECENT LABS AND TESTS: BMET    Component Value Date/Time   NA 141 02/12/2017 1052   K 4.3 02/12/2017 1052   CL 101 02/12/2017  1052   CO2 23 02/12/2017 1052   GLUCOSE 130 (H) 02/12/2017 1052   GLUCOSE 123 (H) 08/27/2016 1157   BUN 15 02/12/2017 1052   CREATININE 0.79 02/12/2017 1052   CREATININE 0.81 08/27/2016 1157   CALCIUM 9.6 02/12/2017 1052   GFRNONAA 86 02/12/2017 1052   GFRNONAA 88 01/11/2015 0936   GFRAA 99 02/12/2017 1052   GFRAA >89 01/11/2015 0936   Lab Results  Component Value Date   HGBA1C 8.2 02/12/2017   No results found for: INSULIN CBC    Component Value Date/Time   WBC 11.5 (H) 02/12/2017 1052   WBC 7.7 12/12/2016 1145   WBC 14.1 (H) 11/07/2016 1018   RBC 4.94 02/12/2017 1052   RBC 4.86 12/12/2016 1145   RBC 5.06 11/07/2016 1018   HGB 13.5 02/12/2017 1052   HCT 41.8 02/12/2017 1052   PLT 332 02/12/2017 1052   MCV 85 02/12/2017 1052   MCH 27.3 02/12/2017 1052   MCH 28.3 12/12/2016 1145   MCH 27.5 11/07/2016 1018   MCHC 32.3 02/12/2017 1052   MCHC 33.9 12/12/2016 1145   MCHC  32.3 11/07/2016 1018   RDW 15.7 (H) 02/12/2017 1052   LYMPHSABS 4.4 (H) 02/12/2017 1052   MONOABS 846 11/07/2016 1018   EOSABS 0.3 02/12/2017 1052   BASOSABS 0.0 02/12/2017 1052   Iron/TIBC/Ferritin/ %Sat    Component Value Date/Time   IRON 30 (L) 01/25/2013 1050   TIBC 375 11/23/2012 1345   FERRITIN 10 11/23/2012 1345   IRONPCTSAT 12 (L) 11/23/2012 1345   Lipid Panel     Component Value Date/Time   CHOL 190 02/12/2017 1052   TRIG 154 (H) 02/12/2017 1052   HDL 45 02/12/2017 1052   CHOLHDL 4.2 02/12/2017 1052   CHOLHDL 3.7 08/27/2016 1157   VLDL 23 08/27/2016 1157   LDLCALC 114 (H) 02/12/2017 1052   Hepatic Function Panel     Component Value Date/Time   PROT 7.1 02/12/2017 1052   ALBUMIN 4.2 02/12/2017 1052   AST 31 02/12/2017 1052   ALT 49 (H) 02/12/2017 1052   ALKPHOS 85 02/12/2017 1052   BILITOT 0.2 02/12/2017 1052      Component Value Date/Time   TSH 1.33 11/07/2016 1018   TSH 1.78 05/04/2016 0958   TSH 2.203 10/05/2014 0945    ECG  shows NSR with a rate of 87 BPM INDIRECT  CALORIMETER done today shows a VO2 of 361 and a REE of 2514.    ASSESSMENT AND PLAN: Other fatigue - Plan: EKG 12-Lead, Comprehensive metabolic panel, CBC With Differential, Vitamin B12, Folate, T3, T4, free, TSH  Type 2 diabetes mellitus without complication, without long-term current use of insulin (HCC) - Plan: Hemoglobin A1c, Insulin, random, Microalbumin, urine  Dyspnea on exertion  Other hyperlipidemia - Plan: Lipid Panel With LDL/HDL Ratio  Essential hypertension - Plan: Comprehensive metabolic panel  Vitamin D deficiency - Plan: VITAMIN D 25 Hydroxy (Vit-D Deficiency, Fractures)  Depression screening  Morbid obesity (HCC)  PLAN: Fatigue Cristina [name] was informed that her fatigue may be related to obesity, depression or many other causes. Labs will be ordered, and in the meanwhile Giamarie has agreed to work on diet, exercise and weight loss to help with fatigue. Proper sleep hygiene was discussed including the need for 7-8 hours of quality sleep each night. A sleep study was not ordered based on symptoms and Epworth score.  Dyspnea on exertion Ammie's shortness of breath appears to be obesity related and exercise induced. She has agreed to work on weight loss and gradually increase exercise to treat her exercise induced shortness of breath. If Mery follows our instructions and loses weight without improvement of her shortness of breath, we will plan to refer to pulmonology. We will monitor this condition regularly. Jaleya agrees to this plan.  Vitamin D Deficiency Cristina [name] was informed that low vitamin D levels contributes to fatigue and are associated with obesity, breast, and colon cancer. She agrees to continue calcium carbonate-Vitamin D and we will check labs and will follow up for routine testing of vitamin D, at least 2-3 times per year. She was informed of the risk of over-replacement of vitamin D and agrees to not increase her dose unless he discusses this with [name] first. Cristina [name] agrees to  follow up with our clinic in 2 weeks.  Diabetes II Cristina [name] has been given extensive diabetes education by myself today including ideal fasting and post-prandial blood glucose readings, individual ideal Hgb A1c goals  and hypoglycemia prevention. We discussed the importance of good blood sugar control to decrease the likelihood of diabetic complications such as nephropathy, neuropathy, limb loss, blindness, coronary  artery disease, and death. We discussed the importance of intensive lifestyle modification including diet, exercise and weight loss as the first line treatment for diabetes. Leidi agrees to check blood sugar 2 times per day and continue her diabetes medications as prescribed. She agrees to continue with diet and exercise. We will check labs and she will follow up at the agreed upon time.  Hypertension We discussed sodium restriction, working on healthy weight loss, and a regular exercise program as the means to achieve improved blood pressure control. Cristina [name] agreed with this plan and agreed to follow up as directed. We will check labs and continue to monitor her blood pressure as well as her progress with the above lifestyle modifications. She will continue her medications as prescribed with goal to decrease blood pressure medications with diet, exercise and weight loss. Cristina [name] will watch for signs of hypotension as she continues her lifestyle modifications.  Hyperlipidemia Cristina [name] was informed of the American Heart Association Guidelines emphasizing intensive lifestyle modifications as the first line treatment for hyperlipidemia. We discussed many lifestyle modifications today in depth, and Cristina [name] will continue to work on decreasing saturated fats such as fatty red meat, butter and many fried foods. She will also increase vegetables and Cristina protein in her diet and continue to work on exercise and weight loss efforts. We will check labs and Cristina [name] agrees to follow up with our clinic in 2 weeks.  Depression  Screen Cristina [name] had a strongly positive depression screening. Depression is commonly associated with obesity and often results in emotional eating behaviors. We will monitor this closely and work on CBT to help improve the non-hunger eating patterns. Referral to Psychology may be required if no improvement is seen as she continues in our clinic.  Obesity Lexxi is currently in the action stage of change and her goal is to continue with weight loss efforts. I recommend Yocelin begin the structured treatment plan as follows:  She has agreed to follow the Category 3 plan Emmagene has been instructed to eventually work up to a goal of 150 minutes of combined cardio and strengthening exercise per week for weight loss and overall health benefits. We discussed the following Behavioral Modification Strategies today: meal planning & cooking strategies, co-worker sabotage, increasing Cristina protein intake and decreasing simple carbohydrates   Keirstin has agreed to join our obesity program and follow up with our clinic in 2 weeks. She was informed of the importance of frequent follow up visits to maximize her success with intensive lifestyle modifications for her multiple health conditions. She was informed we would discuss her lab results at her next visit unless there is a critical issue that needs to be addressed sooner. Sanjuanita agreed to keep her next visit at the agreed upon time to discuss these results.  I, Doreene Nest, am acting as scribe for Dennard Nip, MD  I have reviewed the above documentation for accuracy and completeness, and I agree with the above. -Dennard Nip, MD  OBESITY BEHAVIORAL INTERVENTION VISIT  Today's visit was # 1 out of 22.  Starting weight: 224 lbs Starting date: 04/23/17 Today's weight : 224 lbs Today's date: 04/23/2017 Total lbs lost to date: 0 (Patients must lose 7 lbs in the first 6 months to continue with counseling)   ASK: We discussed the diagnosis of obesity with Cristina [name]  today and Helmi agreed to give [name] permission to discuss obesity behavioral modification therapy today.  ASSESS: Sinaya has the diagnosis of obesity and her BMI today is  42.4 Myalynn is in the action stage of change   ADVISE: Marcianne was educated on the multiple health risks of obesity as well as the benefit of weight loss to improve her health. She was advised of the need for long term treatment and the importance of lifestyle modifications.  AGREE: Multiple dietary modification options and treatment options were discussed and  Linnell agreed to follow the Category 3 plan We discussed the following Behavioral Modification Strategies today: meal planning & cooking strategies, co-worker sabotage, increasing Cristina protein intake and decreasing simple carbohydrates

## 2017-04-24 LAB — HEMOGLOBIN A1C
ESTIMATED AVERAGE GLUCOSE: 163 mg/dL
Hgb A1c MFr Bld: 7.3 % — ABNORMAL HIGH (ref 4.8–5.6)

## 2017-04-24 LAB — COMPREHENSIVE METABOLIC PANEL
ALBUMIN: 4.5 g/dL (ref 3.5–5.5)
ALK PHOS: 83 IU/L (ref 39–117)
ALT: 72 IU/L — ABNORMAL HIGH (ref 0–32)
AST: 62 IU/L — AB (ref 0–40)
Albumin/Globulin Ratio: 1.5 (ref 1.2–2.2)
BILIRUBIN TOTAL: 0.3 mg/dL (ref 0.0–1.2)
BUN / CREAT RATIO: 21 (ref 9–23)
BUN: 17 mg/dL (ref 6–24)
CHLORIDE: 98 mmol/L (ref 96–106)
CO2: 22 mmol/L (ref 20–29)
Calcium: 9.4 mg/dL (ref 8.7–10.2)
Creatinine, Ser: 0.81 mg/dL (ref 0.57–1.00)
GFR calc Af Amer: 95 mL/min/{1.73_m2} (ref >59)
GFR calc non Af Amer: 83 mL/min/{1.73_m2} (ref >59)
GLOBULIN, TOTAL: 3.1 g/dL (ref 1.5–4.5)
Glucose: 109 mg/dL — ABNORMAL HIGH (ref 65–99)
POTASSIUM: 4.4 mmol/L (ref 3.5–5.2)
SODIUM: 137 mmol/L (ref 134–144)
Total Protein: 7.6 g/dL (ref 6.0–8.5)

## 2017-04-24 LAB — LIPID PANEL WITH LDL/HDL RATIO
Cholesterol, Total: 226 mg/dL — ABNORMAL HIGH (ref 100–199)
HDL: 46 mg/dL (ref >39)
LDL Calculated: 141 mg/dL — ABNORMAL HIGH (ref 0–99)
LDl/HDL Ratio: 3.1 ratio (ref 0.0–3.2)
Triglycerides: 196 mg/dL — ABNORMAL HIGH (ref 0–149)
VLDL CHOLESTEROL CAL: 39 mg/dL (ref 5–40)

## 2017-04-24 LAB — CBC WITH DIFFERENTIAL
BASOS ABS: 0 10*3/uL (ref 0.0–0.2)
Basos: 0 %
EOS (ABSOLUTE): 0.2 10*3/uL (ref 0.0–0.4)
Eos: 2 %
Hematocrit: 41.7 % (ref 34.0–46.6)
Hemoglobin: 14 g/dL (ref 11.1–15.9)
IMMATURE GRANULOCYTES: 0 %
Immature Grans (Abs): 0 10*3/uL (ref 0.0–0.1)
LYMPHS: 40 %
Lymphocytes Absolute: 4.6 10*3/uL — ABNORMAL HIGH (ref 0.7–3.1)
MCH: 28 pg (ref 26.6–33.0)
MCHC: 33.6 g/dL (ref 31.5–35.7)
MCV: 83 fL (ref 79–97)
MONOS ABS: 0.5 10*3/uL (ref 0.1–0.9)
Monocytes: 4 %
NEUTROS PCT: 54 %
Neutrophils Absolute: 6.2 10*3/uL (ref 1.4–7.0)
RBC: 5 x10E6/uL (ref 3.77–5.28)
RDW: 16 % — AB (ref 12.3–15.4)
WBC: 11.6 10*3/uL — AB (ref 3.4–10.8)

## 2017-04-24 LAB — T3: T3, Total: 128 ng/dL (ref 71–180)

## 2017-04-24 LAB — INSULIN, RANDOM: INSULIN: 31.3 u[IU]/mL — AB (ref 2.6–24.9)

## 2017-04-24 LAB — FOLATE

## 2017-04-24 LAB — T4, FREE: FREE T4: 1.18 ng/dL (ref 0.82–1.77)

## 2017-04-24 LAB — VITAMIN D 25 HYDROXY (VIT D DEFICIENCY, FRACTURES): VIT D 25 HYDROXY: 34.1 ng/mL (ref 30.0–100.0)

## 2017-04-24 LAB — MICROALBUMIN, URINE: Microalbumin, Urine: <3 ug/mL

## 2017-04-24 LAB — TSH: TSH: 1.42 u[IU]/mL (ref 0.450–4.500)

## 2017-04-24 LAB — VITAMIN B12: VITAMIN B 12: 990 pg/mL (ref 232–1245)

## 2017-04-28 ENCOUNTER — Encounter (INDEPENDENT_AMBULATORY_CARE_PROVIDER_SITE_OTHER): Payer: Self-pay | Admitting: Family Medicine

## 2017-05-01 ENCOUNTER — Encounter (INDEPENDENT_AMBULATORY_CARE_PROVIDER_SITE_OTHER): Payer: Self-pay | Admitting: Specialist

## 2017-05-01 ENCOUNTER — Ambulatory Visit (INDEPENDENT_AMBULATORY_CARE_PROVIDER_SITE_OTHER): Payer: 59 | Admitting: Specialist

## 2017-05-01 VITALS — BP 103/65 | HR 80 | Ht 61.0 in | Wt 225.0 lb

## 2017-05-01 DIAGNOSIS — R200 Anesthesia of skin: Secondary | ICD-10-CM | POA: Diagnosis not present

## 2017-05-01 DIAGNOSIS — M4316 Spondylolisthesis, lumbar region: Secondary | ICD-10-CM

## 2017-05-01 DIAGNOSIS — M542 Cervicalgia: Secondary | ICD-10-CM

## 2017-05-01 DIAGNOSIS — R202 Paresthesia of skin: Secondary | ICD-10-CM

## 2017-05-01 DIAGNOSIS — M48062 Spinal stenosis, lumbar region with neurogenic claudication: Secondary | ICD-10-CM

## 2017-05-01 NOTE — Patient Instructions (Signed)
Avoid bending, stooping and avoid lifting weights greater than 10 lbs. Avoid prolong standing and walking. Avoid frequent bending and stooping  No lifting greater than 10 lbs. May use ice or moist heat for pain. Weight loss is of benefit. Handicap license is approved. Use a carpal tunnel splint at night. Take tylenol 500-650 mg may take up to 3-4 times per day. We will schedule you for  EMGs/NCV of the right arm to assess for carpal tunnel syndrome or cervical nerve root irritation.

## 2017-05-01 NOTE — Progress Notes (Signed)
Office Visit Note   Patient: Cristina [name]           Date of Birth: 1961/11/11           MRN: [id number] Visit Date: 05/01/2017              Requested by: [name], MD [address] [place], [place] [address] PCP: [name], MD   Assessment & Plan: Visit Diagnoses:  1. Numbness and tingling of right hand   2. Cervicalgia   3. Spinal stenosis of lumbar region with neurogenic claudication   4. Spondylolisthesis, lumbar region     Plan: Avoid bending, stooping and avoid lifting weights greater than 10 lbs. Avoid prolong standing and walking. Avoid frequent bending and stooping  No lifting greater than 10 lbs. May use ice or moist heat for pain. Weight loss is of benefit. Handicap license is approved. Use a carpal tunnel splint at night. Take tylenol 500-650 mg may take up to 3-4 times per day. We will schedule you for  EMGs/NCV of the right arm to assess for carpal tunnel syndrome or cervical nerve root irritation.  Follow-Up Instructions: No Follow-up on file.   Orders:  Orders Placed This Encounter  Procedures  . Ambulatory referral to Physical Medicine Rehab   No orders of the defined types were placed in this encounter.     Procedures: No procedures performed   Clinical Data: No additional findings.   Subjective: Chief Complaint  Patient presents with  . Lower Back - Follow-up    56 year old female with history of back pain and radiation into her buttocks and thighs, she is limited in her standing and walking. In grocery stores she hurts, tending to lean on the grocery cart. Complain of tingling in her neck that she has noticed for the past 6 weeks. It is present sometimes with driving and in front of the computer. It is a tingling sensation on the back of the neck, has right CTS, nerve tests have been done. In the past had left CTR for CTS. The left hand has been okay. Notices numbness and tingling occasionally at night. Neck with occasional  popping sensation and with ROM. She has been told that she has fibromyalgia.    Review of Systems  Constitutional: Negative.   HENT: Negative.   Eyes: Negative.   Respiratory: Negative.   Cardiovascular: Negative.   Gastrointestinal: Negative.   Endocrine: Negative.   Genitourinary: Negative.   Musculoskeletal: Negative.   Skin: Negative.   Allergic/Immunologic: Negative.   Neurological: Negative.   Hematological: Negative.   Psychiatric/Behavioral: Negative.      Objective: Vital Signs: BP 103/65 (BP Location: Right Arm, Patient Position: Sitting)   Pulse 80   Ht 5' 1"  (1.549 m)   Wt 225 lb (102.1 kg)   BMI 42.51 kg/m   Physical Exam  Constitutional: She is oriented to person, place, and time. She appears well-developed and well-nourished.  HENT:  Head: Normocephalic and atraumatic.  Eyes: EOM are normal. Pupils are equal, round, and reactive to light.  Neck: Normal range of motion. Neck supple.  Pulmonary/Chest: Effort normal and breath sounds normal.  Abdominal: Soft. Bowel sounds are normal.  Musculoskeletal: Normal range of motion.  Neurological: She is alert and oriented to person, place, and time.  Skin: Skin is warm and dry.  Psychiatric: She has a normal mood and affect. Her behavior is normal. Judgment and thought content normal.    Back Exam  Tenderness  The patient is experiencing tenderness in the lumbar.  Range of Motion  Extension: normal  Flexion: normal  Lateral Bend Right: normal  Lateral Bend Left: normal  Rotation Right: normal  Rotation Left: normal   Muscle Strength  Right Quadriceps:  5/5  Left Quadriceps:  5/5  Right Hamstrings:  5/5  Left Hamstrings:  5/5   Tests  Straight leg raise right: negative Straight leg raise left: negative  Reflexes  Patellar: normal Achilles: normal Biceps: normal Babinski's sign: normal   Other  Toe Walk: normal Heel Walk: normal Gait: normal  Erythema: no back redness Scars:  absent   Right Hand Exam   Tenderness  The patient is experiencing tenderness in the palmer area.  Muscle Strength  The patient has normal right wrist strength.  Tests  Phalen's Sign: negative Tinel's Sign (Medial Nerve): positive Finkelstein: negative  Comments:  Hyper extension testing negative   Left Hand Exam  Left hand exam is normal.  Tenderness  The patient is experiencing tenderness in the palmer area.   Muscle Strength  The patient has normal left wrist strength.  Tests  Phalen's Sign: negative Finkelstein: negative      Specialty Comments:  No specialty comments available.  Imaging: No results found.   PMFS History: Patient Active Problem List   Diagnosis Date Noted  . Family history of colon cancer 04/01/2017  . LUQ abdominal pain 04/01/2017  . Irritable bowel syndrome with diarrhea 04/01/2017  . NSAID long-term use 04/01/2017  . Nausea without vomiting 04/01/2017  . Lymphocytosis 02/24/2017  . Essential hypertension 02/12/2017  . Mild intermittent asthma with acute exacerbation 12/12/2016  . Left shoulder pain 06/02/2015  . Insomnia 05/12/2014  . Fibromyalgia 05/12/2014  . Allergic rhinitis 03/28/2014  . Osteoarthritis 12/20/2013  . Cervical strain 06/21/2013  . Low back pain 06/21/2013  . Metatarsalgia of both feet 03/11/2013  . Chest pain 01/25/2013  . Sciatica 01/25/2013  . Restless leg syndrome 11/24/2012  . Pure hypercholesterolemia 10/20/2012  . OSA on CPAP 10/20/2012  . Depression 10/20/2012  . Iron deficiency anemia, unspecified 10/01/2012  . Diabetes mellitus, type 2 ([place]) 12/31/2010  . ANAL FISSURE 12/31/2010  . Morbid obesity ([place]) 01/08/2007  . CONSTIPATION 01/08/2007   Past Medical History:  Diagnosis Date  . Allergic rhinitis, cause unspecified   . Anemia   . Arthritis   . Asthma   . Back pain   . Chest pain   . Constipation   . CTS (carpal tunnel syndrome)   . Cystocele   . Depression   . Diabetes  mellitus   . Dysfunction of eustachian tube   . Fatty liver   . Fissure, anal   . Gallbladder problem   . Genital herpes   . GERD (gastroesophageal reflux disease)   . Hemorrhoid   . Hx of migraine headaches   . Hyperlipidemia   . Hypertension   . IBS (irritable bowel syndrome)   . Insomnia   . Iron deficiency anemia, unspecified   . Joint pain   . Nausea   . Obesity   . OSA on CPAP   . Osteoarthritis   . Rectocele   . Sleep apnea   . TMJ (dislocation of temporomandibular joint)     Family History  Problem Relation Age of Onset  . Hypertension Mother 74  . Osteoarthritis Mother   . Diabetes Mother   . Colon cancer Mother 56  . Cancer Mother 75       colon  cancer  . Hyperlipidemia Mother   . Stroke Mother   . Obesity Mother   . Sudden death Mother   . Colon cancer Paternal Grandmother   . Cancer Paternal Grandmother   . Stroke Maternal Grandmother   . Colon polyps Brother        x 2 brother  . Diabetes Daughter   . Hypertension Daughter   . Sleep apnea Daughter   . Mental illness Daughter        depression  . Migraines Daughter   . Colon polyps Brother   . Cirrhosis Brother        alcoholism  . Alcohol abuse Brother   . Migraines Daughter   . Mental illness Daughter        anxiety attacks  . Protein S deficiency Daughter   . Sleep apnea Brother   . Hyperlipidemia Brother   . Colon cancer Paternal Aunt     Past Surgical History:  Procedure Laterality Date  . ABDOMINAL HYSTERECTOMY  11/11/2010   [place] Plan.  Ovaries intact.  Uterine fibroids with DUB.  [name] CARPAL TUNNEL RELEASE  1989   Left  . CHOLECYSTECTOMY    . CHOLECYSTECTOMY    . COLONOSCOPY  06/29/2012   normal.  Repeat in 5 years.  [name] [place]  . ESOPHAGOGASTRODUODENOSCOPY  12/13/2011   dysphagia.  Henrene Pastor.  Normal.  . Sleep study  09/11/2012   severe OSA.  CPAP titration at 12 cm water pressure.  . TUBAL LIGATION  1988   Social History   Occupational History  . [employer]   Social History Main Topics  . Smoking status: Former Research scientist (life sciences)  . Smokeless tobacco: Never Used     Comment: smoked occasionally longest 6 mos.  . Alcohol use No  . Drug use: No  . Sexual activity: No

## 2017-05-05 ENCOUNTER — Other Ambulatory Visit: Payer: Self-pay | Admitting: Family Medicine

## 2017-05-05 MED FILL — VICTOZA 18 MG/3 ML INJECT P: 18 | 30 days supply | Qty: 9 | Fill #4

## 2017-05-05 MED FILL — PANTOPRAZOLE SOD DR 40 MG T: 40 | 90 days supply | Qty: 180 | Fill #2

## 2017-05-07 ENCOUNTER — Ambulatory Visit (INDEPENDENT_AMBULATORY_CARE_PROVIDER_SITE_OTHER): Payer: 59 | Admitting: Family Medicine

## 2017-05-07 VITALS — BP 101/66 | HR 88 | Temp 97.3°F | Ht 61.0 in | Wt 222.0 lb

## 2017-05-07 DIAGNOSIS — E669 Obesity, unspecified: Secondary | ICD-10-CM

## 2017-05-07 DIAGNOSIS — Z6841 Body Mass Index (BMI) 40.0 and over, adult: Secondary | ICD-10-CM | POA: Diagnosis not present

## 2017-05-07 DIAGNOSIS — IMO0001 Reserved for inherently not codable concepts without codable children: Secondary | ICD-10-CM

## 2017-05-07 DIAGNOSIS — E559 Vitamin D deficiency, unspecified: Secondary | ICD-10-CM | POA: Diagnosis not present

## 2017-05-07 DIAGNOSIS — E119 Type 2 diabetes mellitus without complications: Secondary | ICD-10-CM | POA: Diagnosis not present

## 2017-05-07 DIAGNOSIS — Z9189 Other specified personal risk factors, not elsewhere classified: Secondary | ICD-10-CM | POA: Diagnosis not present

## 2017-05-07 MED ORDER — VITAMIN D (ERGOCALCIFEROL) 1.25 MG (50000 UNIT) PO CAPS: 50000.0000 [IU] | ORAL_CAPSULE | ORAL | 0 refills | Status: DC

## 2017-05-07 MED FILL — VIT D2 1.25 MG (50,000 UNIT: 1.25 MG | 28 days supply | Qty: 4 | Fill #0

## 2017-05-07 NOTE — Progress Notes (Signed)
Office: [phone]  /  Fax: [phone]   HPI:   Chief Complaint: OBESITY Cristina [name] is here to discuss her progress with her obesity treatment plan. She is on the  follow the Category 3 plan and is following her eating plan approximately 90 % of the time. She states she is exercising 0 minutes 0 times per week. Cristina [name] has done well with category 3 plan. She struggled to eat all her dinner. Hunger is controlled overall and planning ahead wasn't too difficult. Her weight is 222 lb (100.7 kg) today and has had a weight loss of 2 pounds over a period of 2 weeks since her last visit. She has lost 2 lbs since starting treatment with [name].  Vitamin D deficiency Cristina [name] has a diagnosis of vitamin D deficiency. She is currently taking OTC calcium + vit D and denies nausea, vomiting or muscle weakness.  Diabetes II Cristina [name] has a diagnosis of diabetes type II. Cristina [name] didn't bring in blood sugar log today and she denies any hypoglycemic episodes. A1c decreased from 8.2 to 7.3 on multiple diabetes medications. She has been working on intensive lifestyle modifications including diet, exercise, and weight loss to help control her blood glucose levels.  At risk for osteopenia Cristina [name] is at higher risk of osteopenia and osteoporosis due to vitamin D deficiency.    ALLERGIES: Allergies  Allergen Reactions   Fish Oil Rash    MEDICATIONS: Current Outpatient Prescriptions on File Prior to Visit  Medication Sig Dispense Refill   acyclovir (ZOVIRAX) 400 MG tablet TAKE 1 TABLET BY MOUTH 2 TIMES DAILY. 180 tablet 3   albuterol (PROAIR HFA) 108 (90 Base) MCG/ACT inhaler Inhale 2 puffs into the lungs every 6 (six) hours as needed. 18 g 1   ALPRAZolam (XANAX) 0.5 MG tablet TAKE 1 TABLET BY MOUTH ONCE DAILY AS NEEDED FOR ANXIETY 30 tablet 0   AMBULATORY NON FORMULARY MEDICATION Reported on 10/26/2015     ANUCORT-HC 25 MG suppository PLACE 1 SUPPOSITORY RECTALLY 2 TIMES DAILY AS NEEDED FOR HEMORRHOIDS. 25 suppository 5     aspirin EC 81 MG tablet Take 81 mg by mouth daily.     azelastine (ASTELIN) 0.1 % nasal spray PLACE 2 SPRAYS INTO BOTH NOSTRILS 2 TIMES DAILY AS DIRECTED 90 mL 3   azelastine (OPTIVAR) 0.05 % ophthalmic solution Place 1 drop into both eyes daily. PRN  3   BIOTIN 5000 PO Take by mouth.     buPROPion (WELLBUTRIN SR) 100 MG 12 hr tablet Take 1 tablet (100 mg total) by mouth daily. 90 tablet 3   Calcium Carbonate-Vitamin D (CALCIUM + D PO) Take 1 tablet by mouth daily.     canagliflozin (INVOKANA) 300 MG TABS tablet Take 1 tablet (300 mg total) by mouth daily before breakfast. 90 tablet 3   cetirizine (ZYRTEC) 10 MG tablet Take 10 mg by mouth daily.     diclofenac (CATAFLAM) 50 MG tablet Take 1 tablet (50 mg total) by mouth 3 (three) times daily. 90 tablet 3   diltiazem 2 % GEL Apply 1 application topically 3 (three) times daily. 1 Package 3   docusate sodium (COLACE) 100 MG capsule Take 100 mg by mouth as needed. Reported on 05/04/2016     ferrous sulfate 325 (65 FE) MG EC tablet Take 325 mg by mouth daily.      fluticasone (FLONASE) 50 MCG/ACT nasal spray PLACE 2 SPRAYS INTO BOTH NOSTRILS DAILY. 48 g 3   gabapentin (NEURONTIN) 300 MG capsule Take 1 capsule (  300 mg total) by mouth 3 (three) times daily. 270 capsule 1   glipiZIDE (GLIPIZIDE XL) 10 MG 24 hr tablet TAKE 1 TABLET BY MOUTH DAILY WITH BREAKFAST. 90 tablet 3   Glucosamine HCl 1000 MG TABS Take 1,000 mg by mouth 2 (two) times daily with a meal.      glucose blood test strip accucheck guide strips and fast clix lancets Use as instructed dm 2 100 each 1   hydrocortisone 2.5 % ointment Apply topically 2 (two) times daily. (Patient taking differently: Apply topically as needed. ) 30 g 0   Insulin Pen Needle (PEN NEEDLES) 31G X 5 MM MISC 1 each by Does not apply route daily. USE TO INJECT VICTOZA DAILY AS DIRECTED 100 each 3   liraglutide 18 MG/3ML SOPN Inject 0.3 mLs (1.8 mg total) into the skin daily. 6 mL 11    lisinopril (PRINIVIL,ZESTRIL) 5 MG tablet Take 1 tablet (5 mg total) by mouth daily. 90 tablet 3   metFORMIN (GLUCOPHAGE) 1000 MG tablet Take 1 tablet (1,000 mg total) by mouth 2 (two) times daily with a meal. 180 tablet 3   montelukast (SINGULAIR) 10 MG tablet Take 1 tablet (10 mg total) by mouth at bedtime. 90 tablet 3   Multiple Vitamin (MULTIVITAMIN) capsule Take 1 capsule by mouth daily.     nitroGLYCERIN (NITROSTAT) 0.4 MG SL tablet Place 1 tablet (0.4 mg total) under the tongue every 5 (five) minutes as needed for chest pain. 20 tablet 0   pantoprazole (PROTONIX) 40 MG tablet TAKE 1 TABLET BY MOUTH 2 TIMES DAILY. 180 tablet 3   rOPINIRole (REQUIP) 1 MG tablet TAKE 1 TABLET BY MOUTH EVERY NIGHT AT BEDTIME. 90 tablet 3   sertraline (ZOLOFT) 100 MG tablet TAKE 1 & 1/2 TABLET BY MOUTH DAILY 135 tablet 3   simvastatin (ZOCOR) 40 MG tablet TAKE 1 TABLET BY MOUTH DAILY AT 6 PM. 90 tablet 3   sucralfate (CARAFATE) 1 g tablet Take 1 tab with meals and at bedtime x 14 days. 56 tablet 0   TRUEPLUS LANCETS 30G MISC USE TO CHECK BLOOD GLUCOSE DAILY AS DIRECTED 100 each 3   Current Facility-Administered Medications on File Prior to Visit  Medication Dose Route Frequency Provider Last Rate Last Dose   technetium tetrofosmin (TC-MYOVIEW) injection 37.8 millicurie  58.8 millicurie Intravenous Once PRN Larey Dresser, MD        PAST MEDICAL HISTORY: Past Medical History:  Diagnosis Date   Allergic rhinitis, cause unspecified    Anemia    Arthritis    Asthma    Back pain    Chest pain    Constipation    CTS (carpal tunnel syndrome)    Cystocele    Depression    Diabetes mellitus    Dysfunction of eustachian tube    Fatty liver    Fissure, anal    Gallbladder problem    Genital herpes    GERD (gastroesophageal reflux disease)    Hemorrhoid    Hx of migraine headaches    Hyperlipidemia    Hypertension    IBS (irritable bowel syndrome)    Insomnia     Iron deficiency anemia, unspecified    Joint pain    Nausea    Obesity    OSA on CPAP    Osteoarthritis    Rectocele    Sleep apnea    TMJ (dislocation of temporomandibular joint)     PAST SURGICAL HISTORY: Past Surgical History:  Procedure Laterality Date  ABDOMINAL HYSTERECTOMY  11/11/2010   [facility] Plan.  Ovaries intact.  Uterine fibroids with DUB.   CARPAL TUNNEL RELEASE  1989   Left   CHOLECYSTECTOMY     CHOLECYSTECTOMY     COLONOSCOPY  06/29/2012   normal.  Repeat in 5 years.   ECTOPIC PREGNANCY SURGERY  1993   ESOPHAGOGASTRODUODENOSCOPY  12/13/2011   dysphagia.  Henrene Pastor.  Normal.   Sleep study  09/11/2012   severe OSA.  CPAP titration at 12 cm water pressure.   TUBAL LIGATION  1988    SOCIAL HISTORY: Social History  Substance Use Topics   Smoking status: Former Smoker   Smokeless tobacco: Never Used     Comment: smoked occasionally longest 6 mos.   Alcohol use No    FAMILY HISTORY: Family History  Problem Relation Age of Onset   Hypertension Mother 70   Osteoarthritis Mother    Diabetes Mother    Colon cancer Mother 47   Cancer Mother 75       colon cancer   Hyperlipidemia Mother    Stroke Mother    Obesity Mother    Sudden death Mother    Colon cancer Paternal Grandmother    Cancer Paternal Grandmother    Stroke Maternal Grandmother    Colon polyps Brother        x 2 brother   Diabetes Daughter    Hypertension Daughter    Sleep apnea Daughter    Mental illness Daughter        depression   Migraines Daughter    Colon polyps Brother    Cirrhosis Brother        alcoholism   Alcohol abuse Brother    Migraines Daughter    Mental illness Daughter        anxiety attacks   Protein S deficiency Daughter    Sleep apnea Brother    Hyperlipidemia Brother    Colon cancer Paternal Aunt     ROS: Review of Systems  Constitutional: Positive for weight loss.  Gastrointestinal: Negative for nausea and vomiting.   Musculoskeletal:       Negative muscle weakness  Endo/Heme/Allergies:       Negative hypoglycemia    PHYSICAL EXAM: Blood pressure 101/66, pulse 88, temperature 97.3 F (36.3 C), temperature source Oral, height 5' 1"  (1.549 m), weight 222 lb (100.7 kg), SpO2 97 %. Body mass index is 41.95 kg/m. Physical Exam  Constitutional: She is oriented to person, place, and time. She appears well-developed and well-nourished.  Cardiovascular: Normal rate.   Pulmonary/Chest: Effort normal.  Musculoskeletal: Normal range of motion.  Neurological: She is oriented to person, place, and time.  Skin: Skin is warm and dry.  Psychiatric: She has a normal mood and affect. Her behavior is normal.  Vitals reviewed.   RECENT LABS AND TESTS: BMET    Component Value Date/Time   NA 137 04/23/2017 1002   K 4.4 04/23/2017 1002   CL 98 04/23/2017 1002   CO2 22 04/23/2017 1002   GLUCOSE 109 (H) 04/23/2017 1002   GLUCOSE 123 (H) 08/27/2016 1157   BUN 17 04/23/2017 1002   CREATININE 0.81 04/23/2017 1002   CREATININE 0.81 08/27/2016 1157   CALCIUM 9.4 04/23/2017 1002   GFRNONAA 83 04/23/2017 1002   GFRNONAA 88 01/11/2015 0936   GFRAA 95 04/23/2017 1002   GFRAA >89 01/11/2015 0936   Lab Results  Component Value Date   HGBA1C 7.3 (H) 04/23/2017   HGBA1C 8.2 02/12/2017   HGBA1C 7.7 12/07/2016  HGBA1C 7.1 (H) 08/27/2016   HGBA1C 8.4 (H) 05/04/2016   Lab Results  Component Value Date   INSULIN 31.3 (H) 04/23/2017   CBC    Component Value Date/Time   WBC 11.6 (H) 04/23/2017 1002   WBC 7.7 12/12/2016 1145   WBC 14.1 (H) 11/07/2016 1018   RBC 5.00 04/23/2017 1002   RBC 4.86 12/12/2016 1145   RBC 5.06 11/07/2016 1018   HGB 14.0 04/23/2017 1002   HCT 41.7 04/23/2017 1002   PLT 332 02/12/2017 1052   MCV 83 04/23/2017 1002   MCH 28.0 04/23/2017 1002   MCH 28.3 12/12/2016 1145   MCH 27.5 11/07/2016 1018   MCHC 33.6 04/23/2017 1002   MCHC 33.9 12/12/2016 1145   MCHC 32.3 11/07/2016 1018    RDW 16.0 (H) 04/23/2017 1002   LYMPHSABS 4.6 (H) 04/23/2017 1002   MONOABS 846 11/07/2016 1018   EOSABS 0.2 04/23/2017 1002   BASOSABS 0.0 04/23/2017 1002   Iron/TIBC/Ferritin/ %Sat    Component Value Date/Time   IRON 30 (L) 01/25/2013 1050   TIBC 375 11/23/2012 1345   FERRITIN 10 11/23/2012 1345   IRONPCTSAT 12 (L) 11/23/2012 1345   Lipid Panel     Component Value Date/Time   CHOL 226 (H) 04/23/2017 1002   TRIG 196 (H) 04/23/2017 1002   HDL 46 04/23/2017 1002   CHOLHDL 4.2 02/12/2017 1052   CHOLHDL 3.7 08/27/2016 1157   VLDL 23 08/27/2016 1157   LDLCALC 141 (H) 04/23/2017 1002   Hepatic Function Panel     Component Value Date/Time   PROT 7.6 04/23/2017 1002   ALBUMIN 4.5 04/23/2017 1002   AST 62 (H) 04/23/2017 1002   ALT 72 (H) 04/23/2017 1002   ALKPHOS 83 04/23/2017 1002   BILITOT 0.3 04/23/2017 1002      Component Value Date/Time   TSH 1.420 04/23/2017 1002   TSH 1.33 11/07/2016 1018   TSH 1.78 05/04/2016 0958    ASSESSMENT AND PLAN: Vitamin D deficiency - Plan: Vitamin D, Ergocalciferol, (DRISDOL) 50000 units CAPS capsule  Type 2 diabetes mellitus without complication, without long-term current use of insulin (HCC)  At risk for osteoporosis  Class 3 obesity with serious comorbidity and body mass index (BMI) of 40.0 to 44.9 in adult, unspecified obesity type ([place])  PLAN:  Vitamin D Deficiency Cristina [name] was informed that low vitamin D levels contributes to fatigue and are associated with obesity, breast, and colon cancer. She agrees to continue OTC calcium + vitamin D and start  to take prescription Vit D @50 ,000 IU every week #4 with no refills and will follow up for routine testing of vitamin D, at least 2-3 times per year. She was informed of the risk of over-replacement of vitamin D and agrees to not increase her dose unless he discusses this with [name] first. Cristina [name] agrees to follow up with our clinic in 2 weeks.  Diabetes II Cristina [name] has been given extensive  diabetes education by myself today including ideal fasting and post-prandial blood glucose readings, individual ideal Hgb A1c goals  and hypoglycemia prevention. We discussed the importance of good blood sugar control to decrease the likelihood of diabetic complications such as nephropathy, neuropathy, limb loss, blindness, coronary artery disease, and death. We discussed the importance of intensive lifestyle modification including diet, exercise and weight loss as the first line treatment for diabetes. Cristina [name] agrees to continue her diabetes medications, diet and exercise and will follow up at the agreed upon time.  At risk for osteopenia Cristina [name]  is at risk for osteopenia and osteoporsis due to her vitamin D deficiency. She was encouraged to take her vitamin D and follow her higher calcium diet and increase strengthening exercise to help strengthen her bones and decrease her risk of osteopenia and osteoporosis.  Obesity Cristina [name] is currently in the action stage of change. As such, her goal is to continue with weight loss efforts She has agreed to follow the Category 3 plan Cristina [name] has been instructed to work up to a goal of 150 minutes of combined cardio and strengthening exercise per week for weight loss and overall health benefits. We discussed the following Behavioral Modification Strategies today: increasing lean protein intake, meal planning & cooking strategies and planning for success  Cristina [name] has agreed to follow up with our clinic in 2 weeks. She was informed of the importance of frequent follow up visits to maximize her success with intensive lifestyle modifications for her multiple health conditions.  I, Cristina [name], am acting as transcriptionist for Cristina Nip, MD  I have reviewed the above documentation for accuracy and completeness, and I agree with the above. -Cristina Nip, MD  OBESITY BEHAVIORAL INTERVENTION VISIT  Today's visit was # 2 out of 81.  Starting weight: 224 lbs Starting  date: 04/23/17 Today's weight : 222 lbs Today's date: 05/07/2017 Total lbs lost to date: 2 (Patients must lose 7 lbs in the first 6 months to continue with counseling)   ASK: We discussed the diagnosis of obesity with Cristina [name] today and Cristina [name] agreed to give [name] permission to discuss obesity behavioral modification therapy today.  ASSESS: Cristina [name] has the diagnosis of obesity and her BMI today is 59 Cristina [name] is in the action stage of change   ADVISE: Cristina [name] was educated on the multiple health risks of obesity as well as the benefit of weight loss to improve her health. She was advised of the need for long term treatment and the importance of lifestyle modifications.  AGREE: Multiple dietary modification options and treatment options were discussed and  Cristina [name] agreed to follow the Category 3 plan We discussed the following Behavioral Modification Strategies today: increasing lean protein intake, meal planning & cooking strategies and planning for success.

## 2017-05-08 ENCOUNTER — Other Ambulatory Visit: Payer: Self-pay | Admitting: Family Medicine

## 2017-05-08 MED FILL — INVOKANA 300 MG TABLET: 300 | 30 days supply | Qty: 30 | Fill #0

## 2017-05-08 MED FILL — SERTRALINE HCL 100 MG TAB: 100 | 30 days supply | Qty: 45 | Fill #0

## 2017-05-08 MED FILL — metFORMIN HCL 1000 MG TABS: 1000 | 30 days supply | Qty: 60 | Fill #0

## 2017-05-08 MED FILL — LISINOPRIL 5 MG TABLET: 5 | 30 days supply | Qty: 30 | Fill #0

## 2017-05-08 MED FILL — rOPINIRole HCL 1 MG TABS: 1 | 30 days supply | Qty: 30 | Fill #0

## 2017-05-08 MED FILL — MONTELUKAST SOD 10 MG TAB: 10 | 30 days supply | Qty: 30 | Fill #0

## 2017-05-09 NOTE — Telephone Encounter (Signed)
Requip reordered 6/28.

## 2017-05-21 ENCOUNTER — Ambulatory Visit (INDEPENDENT_AMBULATORY_CARE_PROVIDER_SITE_OTHER): Payer: 59 | Admitting: Family Medicine

## 2017-05-21 ENCOUNTER — Ambulatory Visit (INDEPENDENT_AMBULATORY_CARE_PROVIDER_SITE_OTHER): Payer: 59 | Admitting: Physician Assistant

## 2017-05-21 ENCOUNTER — Encounter: Payer: Self-pay | Admitting: Family Medicine

## 2017-05-21 VITALS — BP 102/69 | HR 79 | Temp 98.0°F | Ht 61.0 in | Wt 220.0 lb

## 2017-05-21 VITALS — BP 106/74 | HR 74 | Temp 98.2°F | Resp 18 | Ht 61.02 in | Wt 222.8 lb

## 2017-05-21 DIAGNOSIS — G2581 Restless legs syndrome: Secondary | ICD-10-CM

## 2017-05-21 DIAGNOSIS — Z9989 Dependence on other enabling machines and devices: Secondary | ICD-10-CM

## 2017-05-21 DIAGNOSIS — E1142 Type 2 diabetes mellitus with diabetic polyneuropathy: Secondary | ICD-10-CM | POA: Diagnosis not present

## 2017-05-21 DIAGNOSIS — G4733 Obstructive sleep apnea (adult) (pediatric): Secondary | ICD-10-CM | POA: Diagnosis not present

## 2017-05-21 DIAGNOSIS — J301 Allergic rhinitis due to pollen: Secondary | ICD-10-CM

## 2017-05-21 DIAGNOSIS — E669 Obesity, unspecified: Secondary | ICD-10-CM

## 2017-05-21 DIAGNOSIS — J4521 Mild intermittent asthma with (acute) exacerbation: Secondary | ICD-10-CM

## 2017-05-21 DIAGNOSIS — IMO0001 Reserved for inherently not codable concepts without codable children: Secondary | ICD-10-CM | POA: Insufficient documentation

## 2017-05-21 DIAGNOSIS — E7800 Pure hypercholesterolemia, unspecified: Secondary | ICD-10-CM

## 2017-05-21 DIAGNOSIS — S1096XA Insect bite of unspecified part of neck, initial encounter: Secondary | ICD-10-CM | POA: Diagnosis not present

## 2017-05-21 DIAGNOSIS — Z0000 Encounter for general adult medical examination without abnormal findings: Secondary | ICD-10-CM | POA: Diagnosis not present

## 2017-05-21 DIAGNOSIS — M797 Fibromyalgia: Secondary | ICD-10-CM | POA: Diagnosis not present

## 2017-05-21 DIAGNOSIS — W57XXXA Bitten or stung by nonvenomous insect and other nonvenomous arthropods, initial encounter: Secondary | ICD-10-CM

## 2017-05-21 DIAGNOSIS — I10 Essential (primary) hypertension: Secondary | ICD-10-CM

## 2017-05-21 DIAGNOSIS — F324 Major depressive disorder, single episode, in partial remission: Secondary | ICD-10-CM | POA: Diagnosis not present

## 2017-05-21 DIAGNOSIS — F5104 Psychophysiologic insomnia: Secondary | ICD-10-CM | POA: Diagnosis not present

## 2017-05-21 DIAGNOSIS — Z6841 Body Mass Index (BMI) 40.0 and over, adult: Secondary | ICD-10-CM

## 2017-05-21 DIAGNOSIS — E119 Type 2 diabetes mellitus without complications: Secondary | ICD-10-CM | POA: Diagnosis not present

## 2017-05-21 DIAGNOSIS — Z800 Family history of malignant neoplasm of digestive organs: Secondary | ICD-10-CM

## 2017-05-21 MED ORDER — SERTRALINE HCL 100 MG PO TABS: 150.0000 mg | ORAL_TABLET | Freq: Every day | ORAL | 3 refills | Status: DC

## 2017-05-21 MED ORDER — GLUCOSE BLOOD VI STRP: ORAL_STRIP | 1 refills | Status: DC

## 2017-05-21 MED ORDER — GLIPIZIDE ER 10 MG PO TB24: ORAL_TABLET | ORAL | 3 refills | Status: DC

## 2017-05-21 MED ORDER — ACYCLOVIR 400 MG PO TABS: ORAL_TABLET | ORAL | 3 refills | Status: DC

## 2017-05-21 MED ORDER — ATORVASTATIN CALCIUM 20 MG PO TABS: 20.0000 mg | ORAL_TABLET | Freq: Every day | ORAL | 3 refills | Status: DC

## 2017-05-21 MED ORDER — LIRAGLUTIDE 18 MG/3ML ~~LOC~~ SOPN: 1.8000 mg | PEN_INJECTOR | Freq: Every day | SUBCUTANEOUS | 11 refills | Status: DC

## 2017-05-21 MED ORDER — FLUTICASONE PROPIONATE 50 MCG/ACT NA SUSP: NASAL | 3 refills | Status: DC

## 2017-05-21 MED ORDER — ALBUTEROL SULFATE HFA 108 (90 BASE) MCG/ACT IN AERS: 2.0000 | INHALATION_SPRAY | Freq: Four times a day (QID) | RESPIRATORY_TRACT | 1 refills | Status: DC | PRN

## 2017-05-21 MED ORDER — TRUEPLUS LANCETS 30G MISC: 3 refills | Status: AC

## 2017-05-21 MED ORDER — HYDROCORTISONE 2.5 % EX OINT: TOPICAL_OINTMENT | Freq: Two times a day (BID) | CUTANEOUS | 0 refills | Status: DC

## 2017-05-21 MED ORDER — METFORMIN HCL 1000 MG PO TABS: ORAL_TABLET | ORAL | 3 refills | Status: DC

## 2017-05-21 MED ORDER — ANUCORT-HC 25 MG RE SUPP: RECTAL | 5 refills | Status: AC

## 2017-05-21 MED ORDER — PEN NEEDLES 31G X 5 MM MISC: 1.0000 | Freq: Every day | 3 refills | Status: DC

## 2017-05-21 MED ORDER — GABAPENTIN 300 MG PO CAPS: 300.0000 mg | ORAL_CAPSULE | Freq: Three times a day (TID) | ORAL | 1 refills | Status: DC

## 2017-05-21 MED ORDER — PANTOPRAZOLE SODIUM 40 MG PO TBEC
DELAYED_RELEASE_TABLET | ORAL | 3 refills | Status: DC
Start: 2017-05-21 — End: 2018-04-01

## 2017-05-21 MED ORDER — BUPROPION HCL ER (SR) 100 MG PO TB12: 100.0000 mg | ORAL_TABLET | Freq: Every day | ORAL | 3 refills | Status: DC

## 2017-05-21 MED ORDER — AZELASTINE HCL 0.1 % NA SOLN: NASAL | 3 refills | Status: AC

## 2017-05-21 MED ORDER — CANAGLIFLOZIN 300 MG PO TABS: 300.0000 mg | ORAL_TABLET | Freq: Every day | ORAL | 3 refills | Status: DC

## 2017-05-21 MED ORDER — ROPINIROLE HCL 1 MG PO TABS: 1.0000 mg | ORAL_TABLET | Freq: Every day | ORAL | 3 refills | Status: DC

## 2017-05-21 MED ORDER — MONTELUKAST SODIUM 10 MG PO TABS: 10.0000 mg | ORAL_TABLET | Freq: Every day | ORAL | 0 refills | Status: DC

## 2017-05-21 MED FILL — ATORVASTATIN 20 MG TABLET: 20 | 90 days supply | Qty: 90 | Fill #0

## 2017-05-21 NOTE — Progress Notes (Signed)
Subjective:    Patient ID: Cristina [name], female    DOB: 22-Dec-1960, 56 y.o.   MRN: [id number]  HPI This 56 y.o. female presents for Complete Physical Examination.  Last physical:  05-04-2016 Pap smear:  10-05-2014 WNL: hysterectomy ovaries intact; DUB. Mammogram:  02-05-2017 Colonoscopy:  06-29-2012; scheduled 06/2017. Bone density:n/a Eye exam:  12/05/16 Dental exam:  Scheduled every six months.  BP Readings from Last 3 Encounters:  05/21/17 102/69  05/21/17 106/74  05/07/17 101/66   Wt Readings from Last 3 Encounters:  05/21/17 220 lb (99.8 kg)  05/21/17 222 lb 12.8 oz (101.1 kg)  05/07/17 222 lb (100.7 kg)   Immunization History  Administered Date(s) Administered  . Hepatitis B 11/12/1999  . Influenza Split 11/11/2010, 08/14/2012, 08/25/2014  . Influenza-Unspecified 07/12/2016  . Pneumococcal Polysaccharide-23 11/12/2007, 05/04/2016  . Td 11/11/1998  . Tdap 02/22/2010  No exam data present  Obesity:  Seeing Dr. Leafy Ro; seeing every 2 weeks. B: no cereal; special milk; no fat no gluten; skim milk.  Lynnae Sandhoff light bread 45 carbs; eggs for breakfast; yogurts and cheese.  Focus mainly on protein.   L: can eat a frozen dinner as long as limit 300 and below and over 20 grams of protein. Eat a yogurt with it and a fruit can only eat apples, pears, berries. S: 8-10 ounces protein; 2 cups of vegetables.   3 snacks per day.100 calorie snacks.  No protein requirements with snacks.  Bags of cookies or crackers.   Lost 2 pounds the first week. Sugars are improving. Fasting sugars are 80--120.   Leukocytosis; testing negative.  Liked him.  Return PRN.  Feels secondary to medication. No cancers.    Review of Systems  Constitutional: Negative for activity change, appetite change, chills, diaphoresis, fatigue, fever and unexpected weight change.  HENT: Negative for congestion, dental problem, drooling, ear discharge, ear pain, facial swelling, hearing loss, mouth sores, nosebleeds,  postnasal drip, rhinorrhea, sinus pressure, sneezing, sore throat, tinnitus, trouble swallowing and voice change.   Eyes: Negative for photophobia, pain, discharge, redness, itching and visual disturbance.  Respiratory: Negative for apnea, cough, choking, chest tightness, shortness of breath, wheezing and stridor.   Cardiovascular: Negative for chest pain, palpitations and leg swelling.  Gastrointestinal: Negative for abdominal distention, abdominal pain, anal bleeding, blood in stool, constipation, diarrhea, nausea, rectal pain and vomiting.  Endocrine: Negative for cold intolerance, heat intolerance, polydipsia, polyphagia and polyuria.  Genitourinary: Negative for decreased urine volume, difficulty urinating, dyspareunia, dysuria, enuresis, flank pain, frequency, genital sores, hematuria, menstrual problem, pelvic pain, urgency, vaginal bleeding, vaginal discharge and vaginal pain.  Musculoskeletal: Negative for arthralgias, back pain, gait problem, joint swelling, myalgias, neck pain and neck stiffness.  Skin: Negative for color change, pallor, rash and wound.  Allergic/Immunologic: Negative for environmental allergies, food allergies and immunocompromised state.  Neurological: Negative for dizziness, tremors, seizures, syncope, facial asymmetry, speech difficulty, weakness, light-headedness, numbness and headaches.  Hematological: Negative for adenopathy. Does not bruise/bleed easily.  Psychiatric/Behavioral: Negative for agitation, behavioral problems, confusion, decreased concentration, dysphoric mood, hallucinations, self-injury, sleep disturbance and suicidal ideas. The patient is not nervous/anxious and is not hyperactive.    Past Medical History:  Diagnosis Date  . Allergic rhinitis, cause unspecified   . Anemia   . Arthritis   . Asthma   . Back pain   . Chest pain   . Constipation   . CTS (carpal tunnel syndrome)   . Cystocele   . Depression   . Diabetes mellitus   .  Dysfunction of eustachian tube   . Fatty liver   . Fissure, anal   . Gallbladder problem   . Genital herpes   . GERD (gastroesophageal reflux disease)   . Hemorrhoid   . Hx of migraine headaches   . Hyperlipidemia   . Hypertension   . IBS (irritable bowel syndrome)   . Insomnia   . Iron deficiency anemia, unspecified   . Joint pain   . Nausea   . Obesity   . OSA on CPAP   . Osteoarthritis   . Rectocele   . Sleep apnea   . TMJ (dislocation of temporomandibular joint)    Past Surgical History:  Procedure Laterality Date  . ABDOMINAL HYSTERECTOMY  11/11/2010   [place] Plan.  Ovaries intact.  Uterine fibroids with DUB.  [name] CARPAL TUNNEL RELEASE  1989   Left  . CHOLECYSTECTOMY    . CHOLECYSTECTOMY    . COLONOSCOPY  06/29/2012   normal.  Repeat in 5 years.  [name] [place]  . ESOPHAGOGASTRODUODENOSCOPY  12/13/2011   dysphagia.  Henrene Pastor.  Normal.  . Sleep study  09/11/2012   severe OSA.  CPAP titration at 12 cm water pressure.  . TUBAL LIGATION  1988   Allergies  Allergen Reactions  . Fish Oil Rash   Social History   Social History  . Marital status: Single    Spouse name: n/a  . Number of children: 3  . Years of education: college   Occupational History  . [place]   Social History Main Topics  . Smoking status: Former Research scientist (life sciences)  . Smokeless tobacco: Never Used     Comment: smoked occasionally longest 6 mos.  . Alcohol use No  . Drug use: No  . Sexual activity: No   Other Topics Concern  . Not on file   Social History Narrative   Marital status:  Divorced in 2000 after six years of marriage; not dating in 2017.      Children:  3 daughters (38, 52, 57); 6 grandchildren.      Lives: alone.  2 dogs; grandson stays some.      Employment:  Working at [employer]. 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      Tobacco: none      Alcohol: socially; special occasions.  Rare.      Drugs: none      Exercise:   none      Sexual activity:  Not sexually active since 2011.       Guns:  No guns in the home.       Smoke detectors in use,       Seatbelt:  Sometimes uses seat belts. 75% of time.  No texting while driving.     Family History  Problem Relation Age of Onset  . Hypertension Mother 35  . Osteoarthritis Mother   . Diabetes Mother   . Colon cancer Mother 57  . Cancer Mother 73       colon cancer  . Hyperlipidemia Mother   . Obesity Mother   . Sudden death Mother   . Arthritis Mother   . Colon cancer Paternal Grandmother   . Cancer Paternal Grandmother        colon cancer  . Stroke Maternal Grandmother   . Colon polyps Brother        x 2 brother  . Diabetes Daughter   . Hypertension Daughter   . Sleep  apnea Daughter   . Mental illness Daughter        depression  . Migraines Daughter   . Cirrhosis Brother        alcoholism  . Alcohol abuse Brother   . Migraines Daughter   . Mental illness Daughter        anxiety attacks  . Protein S deficiency Daughter   . Sleep apnea Brother   . Hyperlipidemia Brother   . Migraines Brother   . Obstructive Sleep Apnea Brother   . Colon cancer Paternal Aunt        Objective:   Physical Exam  Constitutional: She is oriented to person, place, and time. She appears well-developed and well-nourished. No distress.  HENT:  Head: Normocephalic and atraumatic.  Right Ear: External ear normal.  Left Ear: External ear normal.  Nose: Nose normal.  Mouth/Throat: Oropharynx is clear and moist.  Eyes: Conjunctivae and EOM are normal. Pupils are equal, round, and reactive to light.  Neck: Normal range of motion and full passive range of motion without pain. Neck supple. No JVD present. Carotid bruit is not present. No thyromegaly present.  Cardiovascular: Normal rate, regular rhythm and normal heart sounds.  Exam reveals no gallop and no friction rub.   No murmur heard. Pulmonary/Chest: Effort normal and breath sounds normal. She has no wheezes.  She has no rales. Right breast exhibits no inverted nipple, no mass, no nipple discharge, no skin change and no tenderness. Left breast exhibits no inverted nipple, no mass, no nipple discharge, no skin change and no tenderness. Breasts are symmetrical.  Abdominal: Soft. Bowel sounds are normal. She exhibits no distension and no mass. There is no tenderness. There is no rebound and no guarding.  Genitourinary: There is no rash, tenderness or lesion on the right labia. There is no rash, tenderness or lesion on the left labia. Right adnexum displays no mass, no tenderness and no fullness. Left adnexum displays no mass, no tenderness and no fullness. No erythema, tenderness or bleeding in the vagina. No foreign body in the vagina. No signs of injury around the vagina. No vaginal discharge found.  Musculoskeletal:       Right shoulder: Normal.       Left shoulder: Normal.       Cervical back: Normal.  Lymphadenopathy:    She has no cervical adenopathy.  Neurological: She is alert and oriented to person, place, and time. She has normal reflexes. No cranial nerve deficit. She exhibits normal muscle tone. Coordination normal.  Skin: Skin is warm and dry. No rash noted. She is not diaphoretic. No erythema. No pallor.  Psychiatric: She has a normal mood and affect. Her behavior is normal. Judgment and thought content normal.  Nursing note and vitals reviewed.   Depression screen [facility] 2/9 05/21/2017 04/23/2017 02/12/2017 12/18/2016 12/12/2016  Decreased Interest 0 3 0 0 0  Down, Depressed, Hopeless 0 3 0 0 1  PHQ - 2 Score 0 6 0 0 1  Altered sleeping - 3 - - -  Tired, decreased energy - 3 - - -  Change in appetite - 2 - - -  Feeling bad or failure about yourself  - 1 - - -  Trouble concentrating - 1 - - -  Moving slowly or fidgety/restless - 1 - - -  Suicidal thoughts - - - - -  PHQ-9 Score - 17 - - -  Difficult doing work/chores - - - - -  Some recent data might be  hidden   Fall Risk  05/21/2017 02/12/2017  12/18/2016 12/12/2016 12/07/2016  Falls in the past year? No No No Yes Yes  Number falls in past yr: - - - 2 or more 1  Injury with Fall? - - - No No  Risk for fall due to : - - - - -        Assessment & Plan:   1. Routine physical examination   2. Insect bite of neck with local reaction, initial encounter   3. Essential hypertension   4. Seasonal allergic rhinitis due to pollen   5. Mild intermittent asthma with acute exacerbation   6. OSA on CPAP   7. Type 2 diabetes mellitus with diabetic polyneuropathy, without long-term current use of insulin ([place])   8. Major depressive disorder with single episode, in partial remission ([place])   9. Family history of colon cancer   10. Fibromyalgia   11. Psychophysiological insomnia   12. Restless leg syndrome   13. Pure hypercholesterolemia     -anticipatory guidance provided --- exercise, weight loss, safe driving practices, aspirin 81mg  daily. -obtain age appropriate screening labs and labs for chronic disease management. -congratulations on weight loss.  Recent labs obtained by Dr. Dennard Nip reviewed in detail during visit.  While seeing Dr. Leafy Ro, will spread out visits to every six months to avoid insurance issues.   No orders of the defined types were placed in this encounter.  Meds ordered this encounter  Medications  . atorvastatin (LIPITOR) 20 MG tablet    Sig: Take 1 tablet (20 mg total) by mouth daily.    Dispense:  90 tablet    Refill:  3  . acyclovir (ZOVIRAX) 400 MG tablet    Sig: TAKE 1 TABLET BY MOUTH 2 TIMES DAILY.    Dispense:  180 tablet    Refill:  3  . albuterol (PROAIR HFA) 108 (90 Base) MCG/ACT inhaler    Sig: Inhale 2 puffs into the lungs every 6 (six) hours as needed.    Dispense:  18 g    Refill:  1  . ANUCORT-HC 25 MG suppository    Sig: PLACE 1 SUPPOSITORY RECTALLY 2 TIMES DAILY AS NEEDED FOR HEMORRHOIDS.    Dispense:  25 suppository    Refill:  5  . azelastine (ASTELIN) 0.1 % nasal spray    Sig:  PLACE 2 SPRAYS INTO BOTH NOSTRILS 2 TIMES DAILY AS DIRECTED    Dispense:  90 mL    Refill:  3  . buPROPion (WELLBUTRIN SR) 100 MG 12 hr tablet    Sig: Take 1 tablet (100 mg total) by mouth daily.    Dispense:  90 tablet    Refill:  3  . fluticasone (FLONASE) 50 MCG/ACT nasal spray    Sig: PLACE 2 SPRAYS INTO BOTH NOSTRILS DAILY.    Dispense:  48 g    Refill:  3    USE THIS 90 DAY SUPPLY TO REPLACE RX SENT TODAY FOR 30 DAY SUPPLY.  [name] gabapentin (NEURONTIN) 300 MG capsule    Sig: Take 1 capsule (300 mg total) by mouth 3 (three) times daily.    Dispense:  270 capsule    Refill:  1  . glipiZIDE (GLIPIZIDE XL) 10 MG 24 hr tablet    Sig: TAKE 1 TABLET BY MOUTH DAILY WITH BREAKFAST.    Dispense:  90 tablet    Refill:  3  . glucose blood test strip    Sig: accucheck guide strips and fast  clix lancets Use as instructed dm 2    Dispense:  100 each    Refill:  1  . hydrocortisone 2.5 % ointment    Sig: Apply topically 2 (two) times daily.    Dispense:  30 g    Refill:  0  . Insulin Pen Needle (PEN NEEDLES) 31G X 5 MM MISC    Sig: 1 each by Does not apply route daily. USE TO INJECT VICTOZA DAILY AS DIRECTED    Dispense:  100 each    Refill:  3    FILL WITH ANY PEN NEEDLE FOR USE WITH VICTOZA PENS  . canagliflozin (INVOKANA) 300 MG TABS tablet    Sig: Take 1 tablet (300 mg total) by mouth daily before breakfast.    Dispense:  90 tablet    Refill:  3  . liraglutide 18 MG/3ML SOPN    Sig: Inject 0.3 mLs (1.8 mg total) into the skin daily.    Dispense:  6 mL    Refill:  11  . metFORMIN (GLUCOPHAGE) 1000 MG tablet    Sig: TAKE 1 TABLET BY MOUTH 2 TIMES DAILY WITH A MEAL.    Dispense:  180 tablet    Refill:  3  . montelukast (SINGULAIR) 10 MG tablet    Sig: Take 1 tablet (10 mg total) by mouth at bedtime.    Dispense:  30 tablet    Refill:  0  . pantoprazole (PROTONIX) 40 MG tablet    Sig: TAKE 1 TABLET BY MOUTH 2 TIMES DAILY.    Dispense:  180 tablet    Refill:  3  . rOPINIRole  (REQUIP) 1 MG tablet    Sig: Take 1 tablet (1 mg total) by mouth at bedtime.    Dispense:  90 tablet    Refill:  3  . sertraline (ZOLOFT) 100 MG tablet    Sig: Take 1.5 tablets (150 mg total) by mouth daily.    Dispense:  135 tablet    Refill:  3  . TRUEPLUS LANCETS 30G MISC    Sig: USE TO CHECK BLOOD GLUCOSE DAILY AS DIRECTED    Dispense:  100 each    Refill:  3   Norwood Levo, M.D. Primary Care at [facility] previously [facility] [address] [place], [place]  [address] [phone] phone [phone] fax

## 2017-05-21 NOTE — Patient Instructions (Addendum)
STOP LISINOPRIL. STOP SIMVASTATIN.  START ATORVASTATIN 20MG DAILY.  IF you received an x-ray today, you will receive an invoice from [facility]. Please contact [facility] at [phone] with questions or concerns regarding your invoice.   IF you received labwork today, you will receive an invoice from [facility]. Please contact [facility] at [phone] with questions or concerns regarding your invoice.   Our billing staff will not be able to assist you with questions regarding bills from these companies.  You will be contacted with the lab results as soon as they are available. The fastest way to get your results is to activate your My Chart account. Instructions are located on the last page of this paperwork. If you have not heard from [name] regarding the results in 2 weeks, please contact this office.      Preventive Care 40-64 Years, Female Preventive care refers to lifestyle choices and visits with your health care provider that can promote health and wellness. What does preventive care include?  A yearly physical exam. This is also called an annual well check.  Dental exams once or twice a year.  Routine eye exams. Ask your health care provider how often you should have your eyes checked.  Personal lifestyle choices, including: ? Daily care of your teeth and gums. ? Regular physical activity. ? Eating a healthy diet. ? Avoiding tobacco and drug use. ? Limiting alcohol use. ? Practicing safe sex. ? Taking low-dose aspirin daily starting at age 60. ? Taking vitamin and mineral supplements as recommended by your health care provider. What happens during an annual well check? The services and screenings done by your health care provider during your annual well check will depend on your age, overall health, lifestyle risk factors, and family history of disease. Counseling Your health care provider may ask you questions about your:  Alcohol use.  Tobacco  use.  Drug use.  Emotional well-being.  Home and relationship well-being.  Sexual activity.  Eating habits.  Work and work Statistician.  Method of birth control.  Menstrual cycle.  Pregnancy history.  Screening You may have the following tests or measurements:  Height, weight, and BMI.  Blood pressure.  Lipid and cholesterol levels. These may be checked every 5 years, or more frequently if you are over 11 years old.  Skin check.  Lung cancer screening. You may have this screening every year starting at age 72 if you have a 30-pack-year history of smoking and currently smoke or have quit within the past 15 years.  Fecal occult blood test (FOBT) of the stool. You may have this test every year starting at age 32.  Flexible sigmoidoscopy or colonoscopy. You may have a sigmoidoscopy every 5 years or a colonoscopy every 10 years starting at age 93.  Hepatitis C blood test.  Hepatitis B blood test.  Sexually transmitted disease (STD) testing.  Diabetes screening. This is done by checking your blood sugar (glucose) after you have not eaten for a while (fasting). You may have this done every 1-3 years.  Mammogram. This may be done every 1-2 years. Talk to your health care provider about when you should start having regular mammograms. This may depend on whether you have a family history of breast cancer.  BRCA-related cancer screening. This may be done if you have a family history of breast, ovarian, tubal, or peritoneal cancers.  Pelvic exam and Pap test. This may be done every 3 years starting at age 32. Starting at age 85,  this may be done every 5 years if you have a Pap test in combination with an HPV test.  Bone density scan. This is done to screen for osteoporosis. You may have this scan if you are at high risk for osteoporosis.  Discuss your test results, treatment options, and if necessary, the need for more tests with your health care provider. Vaccines Your  health care provider may recommend certain vaccines, such as:  Influenza vaccine. This is recommended every year.  Tetanus, diphtheria, and acellular pertussis (Tdap, Td) vaccine. You may need a Td booster every 10 years.  Varicella vaccine. You may need this if you have not been vaccinated.  Zoster vaccine. You may need this after age 52.  Measles, mumps, and rubella (MMR) vaccine. You may need at least one dose of MMR if you were born in 1957 or later. You may also need a second dose.  Pneumococcal 13-valent conjugate (PCV13) vaccine. You may need this if you have certain conditions and were not previously vaccinated.  Pneumococcal polysaccharide (PPSV23) vaccine. You may need one or two doses if you smoke cigarettes or if you have certain conditions.  Meningococcal vaccine. You may need this if you have certain conditions.  Hepatitis A vaccine. You may need this if you have certain conditions or if you travel or work in places where you may be exposed to hepatitis A.  Hepatitis B vaccine. You may need this if you have certain conditions or if you travel or work in places where you may be exposed to hepatitis B.  Haemophilus influenzae type b (Hib) vaccine. You may need this if you have certain conditions.  Talk to your health care provider about which screenings and vaccines you need and how often you need them. This information is not intended to replace advice given to you by your health care provider. Make sure you discuss any questions you have with your health care provider. Document Released: 11/24/2015 Document Revised: 07/17/2016 Document Reviewed: 08/29/2015 Elsevier Interactive Patient Education  2017 [employer].

## 2017-05-22 DIAGNOSIS — E119 Type 2 diabetes mellitus without complications: Secondary | ICD-10-CM | POA: Insufficient documentation

## 2017-05-22 NOTE — Progress Notes (Deleted)
Office: [phone]  /  Fax: [phone]   HPI:   Chief Complaint: OBESITY Cristina [name] is here to discuss her progress with her obesity treatment plan. She is on the  follow the Category 3 plan and is following her eating plan approximately 90 % of the time. She states she is exercising 0 minutes 0 times per week. [name] continues to do well with weight loss. Hunger well controlled. Would like to add variety to breakfast. Her weight is 220 lb (99.8 kg) today and has had a weight loss of 2 pounds over a period of 2 weeks since her last visit. She has lost 4 lbs since starting treatment with [name].  Diabetes II Cristina [name] has a diagnosis of diabetes type II. Cristina [name] states BGs in the 140s and denies any hypoglycemic episodes. Last A1c was 7.3 on 04/23/17.  She has been working on intensive lifestyle modifications including diet, exercise, and weight loss to help control her blood glucose levels.  Hypertension Cristina [name] is a 56 y.o. female with hypertension.  [name] denies chest pain or shortness of breath on exertion. She is working weight loss to help control her blood pressure with the goal of decreasing her risk of heart attack and stroke. She states Lisinopril was stopped by her PCP Dr. Tamala Julian on 05/21/17.  Cristina [name] blood pressure is currently controlled.   ALLERGIES: Allergies  Allergen Reactions  . Fish Oil Rash    MEDICATIONS: Current Outpatient Prescriptions on File Prior to Visit  Medication Sig Dispense Refill  . acyclovir (ZOVIRAX) 400 MG tablet TAKE 1 TABLET BY MOUTH 2 TIMES DAILY. 180 tablet 3  . albuterol (PROAIR HFA) 108 (90 Base) MCG/ACT inhaler Inhale 2 puffs into the lungs every 6 (six) hours as needed. 18 g 1  . AMBULATORY NON FORMULARY MEDICATION Reported on 10/26/2015    . ANUCORT-HC 25 MG suppository PLACE 1 SUPPOSITORY RECTALLY 2 TIMES DAILY AS NEEDED FOR HEMORRHOIDS. 25 suppository 5  . aspirin EC 81 MG tablet Take 81 mg by mouth daily.    Cristina [name] atorvastatin (LIPITOR) 20 MG  tablet Take 1 tablet (20 mg total) by mouth daily. 90 tablet 3  . azelastine (ASTELIN) 0.1 % nasal spray PLACE 2 SPRAYS INTO BOTH NOSTRILS 2 TIMES DAILY AS DIRECTED 90 mL 3  . azelastine (OPTIVAR) 0.05 % ophthalmic solution Place 1 drop into both eyes daily. PRN  3  . BIOTIN 5000 PO Take by mouth.    Cristina [name] buPROPion (WELLBUTRIN SR) 100 MG 12 hr tablet Take 1 tablet (100 mg total) by mouth daily. 90 tablet 3  . Calcium Carbonate-Vitamin D (CALCIUM + D PO) Take 1 tablet by mouth daily.    . canagliflozin (INVOKANA) 300 MG TABS tablet Take 1 tablet (300 mg total) by mouth daily before breakfast. 90 tablet 3  . cetirizine (ZYRTEC) 10 MG tablet Take 10 mg by mouth daily.    Cristina [name] diltiazem 2 % GEL Apply 1 application topically 3 (three) times daily. 1 Package 3  . docusate sodium (COLACE) 100 MG capsule Take 100 mg by mouth as needed. Reported on 05/04/2016    . ferrous sulfate 325 (65 FE) MG EC tablet Take 325 mg by mouth daily.     . fluticasone (FLONASE) 50 MCG/ACT nasal spray PLACE 2 SPRAYS INTO BOTH NOSTRILS DAILY. 48 g 3  . gabapentin (NEURONTIN) 300 MG capsule Take 1 capsule (300 mg total) by mouth 3 (three) times daily. 270 capsule 1  . glipiZIDE (GLIPIZIDE XL) 10 MG  24 hr tablet TAKE 1 TABLET BY MOUTH DAILY WITH BREAKFAST. 90 tablet 3  . Glucosamine HCl 1000 MG TABS Take 1,000 mg by mouth 2 (two) times daily with a meal.     . glucose blood test strip accucheck guide strips and fast clix lancets Use as instructed dm 2 100 each 1  . hydrocortisone 2.5 % ointment Apply topically 2 (two) times daily. 30 g 0  . Insulin Pen Needle (PEN NEEDLES) 31G X 5 MM MISC 1 each by Does not apply route daily. USE TO INJECT VICTOZA DAILY AS DIRECTED 100 each 3  . liraglutide 18 MG/3ML SOPN Inject 0.3 mLs (1.8 mg total) into the skin daily. 6 mL 11  . metFORMIN (GLUCOPHAGE) 1000 MG tablet TAKE 1 TABLET BY MOUTH 2 TIMES DAILY WITH A MEAL. 180 tablet 3  . montelukast (SINGULAIR) 10 MG tablet Take 1 tablet (10 mg total) by  mouth at bedtime. 30 tablet 0  . Multiple Vitamin (MULTIVITAMIN) capsule Take 1 capsule by mouth daily.    . nitroGLYCERIN (NITROSTAT) 0.4 MG SL tablet Place 1 tablet (0.4 mg total) under the tongue every 5 (five) minutes as needed for chest pain. 20 tablet 0  . pantoprazole (PROTONIX) 40 MG tablet TAKE 1 TABLET BY MOUTH 2 TIMES DAILY. 180 tablet 3  . rOPINIRole (REQUIP) 1 MG tablet Take 1 tablet (1 mg total) by mouth at bedtime. 90 tablet 3  . sertraline (ZOLOFT) 100 MG tablet Take 1.5 tablets (150 mg total) by mouth daily. 135 tablet 3  . TRUEPLUS LANCETS 30G MISC USE TO CHECK BLOOD GLUCOSE DAILY AS DIRECTED 100 each 3  . Vitamin D, Ergocalciferol, (DRISDOL) 50000 units CAPS capsule Take 1 capsule (50,000 Units total) by mouth every 7 (seven) days. 4 capsule 0  . lisinopril (PRINIVIL,ZESTRIL) 5 MG tablet TAKE 1 TABLET (5 MG TOTAL) BY MOUTH DAILY. (Patient not taking: Reported on 05/21/2017) 30 tablet 0   Current Facility-Administered Medications on File Prior to Visit  Medication Dose Route Frequency Provider Last Rate Last Dose  . technetium tetrofosmin (TC-MYOVIEW) injection 17.7 millicurie  93.9 millicurie Intravenous Once PRN Larey Dresser, MD        PAST MEDICAL HISTORY: Past Medical History:  Diagnosis Date  . Allergic rhinitis, cause unspecified   . Anemia   . Arthritis   . Asthma   . Back pain   . Chest pain   . Constipation   . CTS (carpal tunnel syndrome)   . Cystocele   . Depression   . Diabetes mellitus   . Dysfunction of eustachian tube   . Fatty liver   . Fissure, anal   . Gallbladder problem   . Genital herpes   . GERD (gastroesophageal reflux disease)   . Hemorrhoid   . Hx of migraine headaches   . Hyperlipidemia   . Hypertension   . IBS (irritable bowel syndrome)   . Insomnia   . Iron deficiency anemia, unspecified   . Joint pain   . Nausea   . Obesity   . OSA on CPAP   . Osteoarthritis   . Rectocele   . Sleep apnea   . TMJ (dislocation of  temporomandibular joint)     PAST SURGICAL HISTORY: Past Surgical History:  Procedure Laterality Date  . ABDOMINAL HYSTERECTOMY  11/11/2010   [place] Plan.  Ovaries intact.  Uterine fibroids with DUB.  Cristina [name] CARPAL TUNNEL RELEASE  1989   Left  . CHOLECYSTECTOMY    . CHOLECYSTECTOMY    .  COLONOSCOPY  06/29/2012   normal.  Repeat in 5 years.  Cristina [name] Golden  . ESOPHAGOGASTRODUODENOSCOPY  12/13/2011   dysphagia.  Henrene Pastor.  Normal.  . Sleep study  09/11/2012   severe OSA.  CPAP titration at 12 cm water pressure.  . TUBAL LIGATION  1988    SOCIAL HISTORY: Social History  Substance Use Topics  . Smoking status: Former Research scientist (life sciences)  . Smokeless tobacco: Never Used     Comment: smoked occasionally longest 6 mos.  . Alcohol use No    FAMILY HISTORY: Family History  Problem Relation Age of Onset  . Hypertension Mother 63  . Osteoarthritis Mother   . Diabetes Mother   . Colon cancer Mother 19  . Cancer Mother 61       colon cancer  . Hyperlipidemia Mother   . Obesity Mother   . Sudden death Mother   . Arthritis Mother   . Colon cancer Paternal Grandmother   . Cancer Paternal Grandmother        colon cancer  . Stroke Maternal Grandmother   . Colon polyps Brother        x 2 brother  . Diabetes Daughter   . Hypertension Daughter   . Sleep apnea Daughter   . Mental illness Daughter        depression  . Migraines Daughter   . Cirrhosis Brother        alcoholism  . Alcohol abuse Brother   . Migraines Daughter   . Mental illness Daughter        anxiety attacks  . Protein S deficiency Daughter   . Sleep apnea Brother   . Hyperlipidemia Brother   . Migraines Brother   . Obstructive Sleep Apnea Brother   . Colon cancer Paternal Aunt     ROS: ROS  PHYSICAL EXAM: Blood pressure 102/69, pulse 79, temperature 98 F (36.7 C), temperature source Oral, height 5' 1"  (1.549 m), weight 220 lb (99.8 kg), SpO2 98 %. Body mass index is 41.57 kg/m. Physical Exam  RECENT  LABS AND TESTS: BMET    Component Value Date/Time   NA 137 04/23/2017 1002   K 4.4 04/23/2017 1002   CL 98 04/23/2017 1002   CO2 22 04/23/2017 1002   GLUCOSE 109 (H) 04/23/2017 1002   GLUCOSE 123 (H) 08/27/2016 1157   BUN 17 04/23/2017 1002   CREATININE 0.81 04/23/2017 1002   CREATININE 0.81 08/27/2016 1157   CALCIUM 9.4 04/23/2017 1002   GFRNONAA 83 04/23/2017 1002   GFRNONAA 88 01/11/2015 0936   GFRAA 95 04/23/2017 1002   GFRAA >89 01/11/2015 0936   Lab Results  Component Value Date   HGBA1C 7.3 (H) 04/23/2017   HGBA1C 8.2 02/12/2017   HGBA1C 7.7 12/07/2016   HGBA1C 7.1 (H) 08/27/2016   HGBA1C 8.4 (H) 05/04/2016   Lab Results  Component Value Date   INSULIN 31.3 (H) 04/23/2017   CBC    Component Value Date/Time   WBC 11.6 (H) 04/23/2017 1002   WBC 7.7 12/12/2016 1145   WBC 14.1 (H) 11/07/2016 1018   RBC 5.00 04/23/2017 1002   RBC 4.86 12/12/2016 1145   RBC 5.06 11/07/2016 1018   HGB 14.0 04/23/2017 1002   HCT 41.7 04/23/2017 1002   PLT 332 02/12/2017 1052   MCV 83 04/23/2017 1002   MCH 28.0 04/23/2017 1002   MCH 28.3 12/12/2016 1145   MCH 27.5 11/07/2016 1018   MCHC 33.6 04/23/2017 1002   MCHC 33.9  12/12/2016 1145   MCHC 32.3 11/07/2016 1018   RDW 16.0 (H) 04/23/2017 1002   LYMPHSABS 4.6 (H) 04/23/2017 1002   MONOABS 846 11/07/2016 1018   EOSABS 0.2 04/23/2017 1002   BASOSABS 0.0 04/23/2017 1002   Iron/TIBC/Ferritin/ %Sat    Component Value Date/Time   IRON 30 (L) 01/25/2013 1050   TIBC 375 11/23/2012 1345   FERRITIN 10 11/23/2012 1345   IRONPCTSAT 12 (L) 11/23/2012 1345   Lipid Panel     Component Value Date/Time   CHOL 226 (H) 04/23/2017 1002   TRIG 196 (H) 04/23/2017 1002   HDL 46 04/23/2017 1002   CHOLHDL 4.2 02/12/2017 1052   CHOLHDL 3.7 08/27/2016 1157   VLDL 23 08/27/2016 1157   LDLCALC 141 (H) 04/23/2017 1002   Hepatic Function Panel     Component Value Date/Time   PROT 7.6 04/23/2017 1002   ALBUMIN 4.5 04/23/2017 1002   AST 62  (H) 04/23/2017 1002   ALT 72 (H) 04/23/2017 1002   ALKPHOS 83 04/23/2017 1002   BILITOT 0.3 04/23/2017 1002      Component Value Date/Time   TSH 1.420 04/23/2017 1002   TSH 1.33 11/07/2016 1018   TSH 1.78 05/04/2016 0958    ASSESSMENT AND PLAN: Essential hypertension  Type 2 diabetes mellitus without complication, without long-term current use of insulin (HCC)  Class 3 obesity with serious comorbidity and body mass index (BMI) of 40.0 to 44.9 in adult, unspecified obesity type ([place])  PLAN:  Diabetes II Lariah has been given extensive diabetes education by myself today including ideal fasting and post-prandial blood glucose readings, individual ideal HgA1c goals  and hypoglycemia prevention. We discussed the importance of good blood sugar control to decrease the likelihood of diabetic complications such as nephropathy, neuropathy, limb loss, blindness, coronary artery disease, and death. We discussed the importance of intensive lifestyle modification including diet, exercise and weight loss as the first line treatment for diabetes. Kerrilynn agrees to continue her diabetes medications and will follow up at the agreed upon time.  Hypertension We discussed sodium restriction, working on healthy weight loss, and a regular exercise program as the means to achieve improved blood pressure control. Sophiea agreed with this plan and agreed to follow up as directed. We will continue to monitor her blood pressure as well as her progress with the above lifestyle modifications. She is advised to keep a BP log to monitor BP after stopping Lisinopril.  She will watch for signs of hypotension as she continues her lifestyle modifications.  Obesity Naina is currently in the action stage of change. As such, her goal is to continue with weight loss efforts She has agreed to follow the Category 3 plan Emilyanne has been instructed to work up to a goal of 150 minutes of combined cardio and strengthening exercise per week  for weight loss and overall health benefits. We discussed the following Behavioral Modification Stratagies today: increasing lean protein intake and work on meal planning and easy cooking plans   Santrice has agreed to follow up with our clinic in 2 weeks. She was informed of the importance of frequent follow up visits to maximize her success with intensive lifestyle modifications for her multiple health conditions.   Office: [phone]  /  Fax: [phone]  OBESITY BEHAVIORAL INTERVENTION VISIT  Today's visit was # 3 out of 22.  Starting weight: 224 Starting date: 04/23/17 Today's weight : Weight: 220 lb (99.8 kg)  Today's date: 05/29/2017 Total lbs lost to date: 4 (Patients  must lose 7 lbs in the first 6 months to continue with counseling)   ASK: We discussed the diagnosis of obesity with [name] today and Shakerra agreed to give [name] permission to discuss obesity behavioral modification therapy today.  ASSESS: Lillian has the diagnosis of obesity and her BMI today is 41.7 Chiyeko is in the action stage of change   ADVISE: Makya was educated on the multiple health risks of obesity as well as the benefit of weight loss to improve her health. She was advised of the need for long term treatment and the importance of lifestyle modifications.  AGREE: Multiple dietary modification options and treatment options were discussed and  Yasmine agreed to follow the Category 3 plan We discussed the following Behavioral Modification Stratagies today: increasing lean protein intake and work on meal planning and easy cooking plans  We spent > than 50% of the 15 minute visit on the counseling as documented in the note.   I have reviewed the above documentation for accuracy and completeness, and I agree with the above. -Lacy Duverney, PA-C  I have reviewed the above note and agree with the plan. -Dennard Nip, MD

## 2017-05-29 NOTE — Progress Notes (Signed)
Office: [phone]  /  Fax: [phone]   HPI:   Chief Complaint: OBESITY Cristina [name] is here to discuss her progress with her obesity treatment plan. She is on the  follow the Category 3 plan and is following her eating plan approximately 90 % of the time. She states she is exercising 0 minutes 0 times per week. Cristina [name] continues to do well with weight loss. She would like more variety for breakfast. Her weight is 220 lb (99.8 kg) today and has had a weight loss of 4 pounds over a period of 2 weeks since her last visit. She has lost 4 lbs since starting treatment with Cristina [name].  Diabetes II Cristina [name] has a diagnosis of diabetes type II. Cristina [name] states FBGs in the 140s and denies any hypoglycemic episodes. Last A1c was 7.3 on 04/23/17.   She has been working on intensive lifestyle modifications including diet, exercise, and weight loss to help control her blood glucose levels.  Hypertension Cristina [name] is a 56 y.o. female with hypertension.  [name] denies chest pain or shortness of breath on exertion. She is working weight loss to help control her blood pressure with the goal of decreasing her risk of heart attack and stroke. [name] blood pressure is currently controlled. Lisinopril was discontinued by her PCP.   ALLERGIES: Allergies  Allergen Reactions  . Fish Oil Rash    MEDICATIONS: Current Outpatient Prescriptions on File Prior to Visit  Medication Sig Dispense Refill  . acyclovir (ZOVIRAX) 400 MG tablet TAKE 1 TABLET BY MOUTH 2 TIMES DAILY. 180 tablet 3  . albuterol (PROAIR HFA) 108 (90 Base) MCG/ACT inhaler Inhale 2 puffs into the lungs every 6 (six) hours as needed. 18 g 1  . AMBULATORY NON FORMULARY MEDICATION Reported on 10/26/2015    . ANUCORT-HC 25 MG suppository PLACE 1 SUPPOSITORY RECTALLY 2 TIMES DAILY AS NEEDED FOR HEMORRHOIDS. 25 suppository 5  . aspirin EC 81 MG tablet Take 81 mg by mouth daily.    Cristina [name] atorvastatin (LIPITOR) 20 MG tablet Take 1 tablet (20 mg total) by mouth  daily. 90 tablet 3  . azelastine (ASTELIN) 0.1 % nasal spray PLACE 2 SPRAYS INTO BOTH NOSTRILS 2 TIMES DAILY AS DIRECTED 90 mL 3  . azelastine (OPTIVAR) 0.05 % ophthalmic solution Place 1 drop into both eyes daily. PRN  3  . BIOTIN 5000 PO Take by mouth.    Cristina [name] buPROPion (WELLBUTRIN SR) 100 MG 12 hr tablet Take 1 tablet (100 mg total) by mouth daily. 90 tablet 3  . Calcium Carbonate-Vitamin D (CALCIUM + D PO) Take 1 tablet by mouth daily.    . canagliflozin (INVOKANA) 300 MG TABS tablet Take 1 tablet (300 mg total) by mouth daily before breakfast. 90 tablet 3  . cetirizine (ZYRTEC) 10 MG tablet Take 10 mg by mouth daily.    Cristina [name] diltiazem 2 % GEL Apply 1 application topically 3 (three) times daily. 1 Package 3  . docusate sodium (COLACE) 100 MG capsule Take 100 mg by mouth as needed. Reported on 05/04/2016    . ferrous sulfate 325 (65 FE) MG EC tablet Take 325 mg by mouth daily.     . fluticasone (FLONASE) 50 MCG/ACT nasal spray PLACE 2 SPRAYS INTO BOTH NOSTRILS DAILY. 48 g 3  . gabapentin (NEURONTIN) 300 MG capsule Take 1 capsule (300 mg total) by mouth 3 (three) times daily. 270 capsule 1  . glipiZIDE (GLIPIZIDE XL) 10 MG 24 hr tablet TAKE 1 TABLET BY MOUTH DAILY  WITH BREAKFAST. 90 tablet 3  . Glucosamine HCl 1000 MG TABS Take 1,000 mg by mouth 2 (two) times daily with a meal.     . glucose blood test strip accucheck guide strips and fast clix lancets Use as instructed dm 2 100 each 1  . hydrocortisone 2.5 % ointment Apply topically 2 (two) times daily. 30 g 0  . Insulin Pen Needle (PEN NEEDLES) 31G X 5 MM MISC 1 each by Does not apply route daily. USE TO INJECT VICTOZA DAILY AS DIRECTED 100 each 3  . liraglutide 18 MG/3ML SOPN Inject 0.3 mLs (1.8 mg total) into the skin daily. 6 mL 11  . metFORMIN (GLUCOPHAGE) 1000 MG tablet TAKE 1 TABLET BY MOUTH 2 TIMES DAILY WITH A MEAL. 180 tablet 3  . montelukast (SINGULAIR) 10 MG tablet Take 1 tablet (10 mg total) by mouth at bedtime. 30 tablet 0  . Multiple  Vitamin (MULTIVITAMIN) capsule Take 1 capsule by mouth daily.    . nitroGLYCERIN (NITROSTAT) 0.4 MG SL tablet Place 1 tablet (0.4 mg total) under the tongue every 5 (five) minutes as needed for chest pain. 20 tablet 0  . pantoprazole (PROTONIX) 40 MG tablet TAKE 1 TABLET BY MOUTH 2 TIMES DAILY. 180 tablet 3  . rOPINIRole (REQUIP) 1 MG tablet Take 1 tablet (1 mg total) by mouth at bedtime. 90 tablet 3  . sertraline (ZOLOFT) 100 MG tablet Take 1.5 tablets (150 mg total) by mouth daily. 135 tablet 3  . TRUEPLUS LANCETS 30G MISC USE TO CHECK BLOOD GLUCOSE DAILY AS DIRECTED 100 each 3  . Vitamin D, Ergocalciferol, (DRISDOL) 50000 units CAPS capsule Take 1 capsule (50,000 Units total) by mouth every 7 (seven) days. 4 capsule 0  . lisinopril (PRINIVIL,ZESTRIL) 5 MG tablet TAKE 1 TABLET (5 MG TOTAL) BY MOUTH DAILY. (Patient not taking: Reported on 05/21/2017) 30 tablet 0   Current Facility-Administered Medications on File Prior to Visit  Medication Dose Route Frequency Provider Last Rate Last Dose  . technetium tetrofosmin (TC-MYOVIEW) injection 33.2 millicurie  95.1 millicurie Intravenous Once PRN Larey Dresser, MD        PAST MEDICAL HISTORY: Past Medical History:  Diagnosis Date  . Allergic rhinitis, cause unspecified   . Anemia   . Arthritis   . Asthma   . Back pain   . Chest pain   . Constipation   . CTS (carpal tunnel syndrome)   . Cystocele   . Depression   . Diabetes mellitus   . Dysfunction of eustachian tube   . Fatty liver   . Fissure, anal   . Gallbladder problem   . Genital herpes   . GERD (gastroesophageal reflux disease)   . Hemorrhoid   . Hx of migraine headaches   . Hyperlipidemia   . Hypertension   . IBS (irritable bowel syndrome)   . Insomnia   . Iron deficiency anemia, unspecified   . Joint pain   . Nausea   . Obesity   . OSA on CPAP   . Osteoarthritis   . Rectocele   . Sleep apnea   . TMJ (dislocation of temporomandibular joint)     PAST SURGICAL  HISTORY: Past Surgical History:  Procedure Laterality Date  . ABDOMINAL HYSTERECTOMY  11/11/2010   Marvel Plan.  Ovaries intact.  Uterine fibroids with DUB.  Cristina [name] CARPAL TUNNEL RELEASE  1989   Left  . CHOLECYSTECTOMY    . CHOLECYSTECTOMY    . COLONOSCOPY  06/29/2012   normal.  Repeat  in 5 years.  Cristina [name] [place]  . ESOPHAGOGASTRODUODENOSCOPY  12/13/2011   dysphagia.  Henrene Pastor.  Normal.  . Sleep study  09/11/2012   severe OSA.  CPAP titration at 12 cm water pressure.  . TUBAL LIGATION  1988    SOCIAL HISTORY: Social History  Substance Use Topics  . Smoking status: Former Research scientist (life sciences)  . Smokeless tobacco: Never Used     Comment: smoked occasionally longest 6 mos.  . Alcohol use No    FAMILY HISTORY: Family History  Problem Relation Age of Onset  . Hypertension Mother 42  . Osteoarthritis Mother   . Diabetes Mother   . Colon cancer Mother 39  . Cancer Mother 23       colon cancer  . Hyperlipidemia Mother   . Obesity Mother   . Sudden death Mother   . Arthritis Mother   . Colon cancer Paternal Grandmother   . Cancer Paternal Grandmother        colon cancer  . Stroke Maternal Grandmother   . Colon polyps Brother        x 2 brother  . Diabetes Daughter   . Hypertension Daughter   . Sleep apnea Daughter   . Mental illness Daughter        depression  . Migraines Daughter   . Cirrhosis Brother        alcoholism  . Alcohol abuse Brother   . Migraines Daughter   . Mental illness Daughter        anxiety attacks  . Protein S deficiency Daughter   . Sleep apnea Brother   . Hyperlipidemia Brother   . Migraines Brother   . Obstructive Sleep Apnea Brother   . Colon cancer Paternal Aunt     ROS: Review of Systems  Constitutional: Positive for weight loss.  Respiratory: Negative for shortness of breath.   Cardiovascular: Negative for chest pain.  Neurological: Negative for headaches.  Endo/Heme/Allergies: Negative.        Hypoglycemia    PHYSICAL EXAM: Blood  pressure 102/69, pulse 79, temperature 98 F (36.7 C), temperature source Oral, height 5\' 1"  (1.549 m), weight 220 lb (99.8 kg), SpO2 98 %. Body mass index is 41.57 kg/m. Physical Exam  Constitutional: She is oriented to person, place, and time. She appears well-developed and well-nourished.  Cardiovascular: Normal rate.   Pulmonary/Chest: Effort normal.  Musculoskeletal: Normal range of motion.  Neurological: She is alert and oriented to person, place, and time.  Skin: Skin is warm and dry.    RECENT LABS AND TESTS: BMET    Component Value Date/Time   NA 137 04/23/2017 1002   K 4.4 04/23/2017 1002   CL 98 04/23/2017 1002   CO2 22 04/23/2017 1002   GLUCOSE 109 (H) 04/23/2017 1002   GLUCOSE 123 (H) 08/27/2016 1157   BUN 17 04/23/2017 1002   CREATININE 0.81 04/23/2017 1002   CREATININE 0.81 08/27/2016 1157   CALCIUM 9.4 04/23/2017 1002   GFRNONAA 83 04/23/2017 1002   GFRNONAA 88 01/11/2015 0936   GFRAA 95 04/23/2017 1002   GFRAA >89 01/11/2015 0936   Lab Results  Component Value Date   HGBA1C 7.3 (H) 04/23/2017   HGBA1C 8.2 02/12/2017   HGBA1C 7.7 12/07/2016   HGBA1C 7.1 (H) 08/27/2016   HGBA1C 8.4 (H) 05/04/2016   Lab Results  Component Value Date   INSULIN 31.3 (H) 04/23/2017   CBC    Component Value Date/Time   WBC 11.6 (H) 04/23/2017 1002  WBC 7.7 12/12/2016 1145   WBC 14.1 (H) 11/07/2016 1018   RBC 5.00 04/23/2017 1002   RBC 4.86 12/12/2016 1145   RBC 5.06 11/07/2016 1018   HGB 14.0 04/23/2017 1002   HCT 41.7 04/23/2017 1002   PLT 332 02/12/2017 1052   MCV 83 04/23/2017 1002   MCH 28.0 04/23/2017 1002   MCH 28.3 12/12/2016 1145   MCH 27.5 11/07/2016 1018   MCHC 33.6 04/23/2017 1002   MCHC 33.9 12/12/2016 1145   MCHC 32.3 11/07/2016 1018   RDW 16.0 (H) 04/23/2017 1002   LYMPHSABS 4.6 (H) 04/23/2017 1002   MONOABS 846 11/07/2016 1018   EOSABS 0.2 04/23/2017 1002   BASOSABS 0.0 04/23/2017 1002   Iron/TIBC/Ferritin/ %Sat    Component Value  Date/Time   IRON 30 (L) 01/25/2013 1050   TIBC 375 11/23/2012 1345   FERRITIN 10 11/23/2012 1345   IRONPCTSAT 12 (L) 11/23/2012 1345   Lipid Panel     Component Value Date/Time   CHOL 226 (H) 04/23/2017 1002   TRIG 196 (H) 04/23/2017 1002   HDL 46 04/23/2017 1002   CHOLHDL 4.2 02/12/2017 1052   CHOLHDL 3.7 08/27/2016 1157   VLDL 23 08/27/2016 1157   LDLCALC 141 (H) 04/23/2017 1002   Hepatic Function Panel     Component Value Date/Time   PROT 7.6 04/23/2017 1002   ALBUMIN 4.5 04/23/2017 1002   AST 62 (H) 04/23/2017 1002   ALT 72 (H) 04/23/2017 1002   ALKPHOS 83 04/23/2017 1002   BILITOT 0.3 04/23/2017 1002      Component Value Date/Time   TSH 1.420 04/23/2017 1002   TSH 1.33 11/07/2016 1018   TSH 1.78 05/04/2016 0958    ASSESSMENT AND PLAN: Essential hypertension  Type 2 diabetes mellitus without complication, without long-term current use of insulin (HCC)  Class 3 obesity with serious comorbidity and body mass index (BMI) of 40.0 to 44.9 in adult, unspecified obesity type ([place])  PLAN:  Diabetes II Cachet has been given extensive diabetes education by myself today including ideal fasting and post-prandial blood glucose readings, individual ideal HgA1c goals  and hypoglycemia prevention. We discussed the importance of good blood sugar control to decrease the likelihood of diabetic complications such as nephropathy, neuropathy, limb loss, blindness, coronary artery disease, and death. We discussed the importance of intensive lifestyle modification including diet, exercise and weight loss as the first line treatment for diabetes. Yaritzi agrees to continue her diabetes medications and will follow up at the agreed upon time.  Hypertension We discussed sodium restriction, working on healthy weight loss, and a regular exercise program as the means to achieve improved blood pressure control. Jasnoor agreed with this plan and agreed to follow up as directed. We will continue to monitor  her blood pressure as well as her progress with the above lifestyle modifications.   Obesity Tari is currently in the action stage of change. As such, her goal is to continue with weight loss efforts She has agreed to follow the Category 3 plan Karson has been instructed to work up to a goal of 150 minutes of combined cardio and strengthening exercise per week for weight loss and overall health benefits. We discussed the following Behavioral Modification Stratagies today: increasing lean protein intake and work on meal planning and easy cooking plans   Taffie has agreed to follow up with our clinic in 2 weeks. She was informed of the importance of frequent follow up visits to maximize her success with intensive lifestyle modifications  for her multiple health conditions.  We spent > than 50% of the 15 minute visit on the counseling as documented in the note.     Office: [phone]  /  Fax: [phone]  OBESITY BEHAVIORAL INTERVENTION VISIT  Today's visit was # 3 out of 22.  Starting weight: 224 Starting date: 04/23/17 Today's weight : Weight: 220 lb (99.8 kg)  Today's date: 05/29/2017 Total lbs lost to date: 4 (Patients must lose 7 lbs in the first 6 months to continue with counseling)   ASK: We discussed the diagnosis of obesity with [name] today and Farran agreed to give Cristina [name] permission to discuss obesity behavioral modification therapy today.  ASSESS: Gladine has the diagnosis of obesity and her BMI today is @TBMI @ [place] is in the action stage of change   ADVISE: Macarena was educated on the multiple health risks of obesity as well as the benefit of weight loss to improve her health. She was advised of the need for long term treatment and the importance of lifestyle modifications.  AGREE: Multiple dietary modification options and treatment options were discussed and  Jayliana agreed to follow the Category 3 plan We discussed the following Behavioral Modification Stratagies today:  increasing lean protein intake and work on meal planning and easy cooking plans   I have reviewed the above documentation for accuracy and completeness, and I agree with the above. -Lacy Duverney, PA-C  I have reviewed the above note and agree with the plan. -Dennard Nip, MD

## 2017-06-03 ENCOUNTER — Encounter: Payer: Self-pay | Admitting: Internal Medicine

## 2017-06-04 ENCOUNTER — Encounter (INDEPENDENT_AMBULATORY_CARE_PROVIDER_SITE_OTHER): Payer: 59 | Admitting: Physical Medicine and Rehabilitation

## 2017-06-05 ENCOUNTER — Ambulatory Visit (INDEPENDENT_AMBULATORY_CARE_PROVIDER_SITE_OTHER): Payer: 59 | Admitting: Physician Assistant

## 2017-06-05 VITALS — BP 102/69 | HR 77 | Temp 97.9°F | Wt 223.0 lb

## 2017-06-05 DIAGNOSIS — Z9189 Other specified personal risk factors, not elsewhere classified: Secondary | ICD-10-CM

## 2017-06-05 DIAGNOSIS — Z6841 Body Mass Index (BMI) 40.0 and over, adult: Secondary | ICD-10-CM | POA: Diagnosis not present

## 2017-06-05 DIAGNOSIS — F3289 Other specified depressive episodes: Secondary | ICD-10-CM

## 2017-06-05 DIAGNOSIS — E559 Vitamin D deficiency, unspecified: Secondary | ICD-10-CM

## 2017-06-05 DIAGNOSIS — E669 Obesity, unspecified: Secondary | ICD-10-CM | POA: Diagnosis not present

## 2017-06-05 DIAGNOSIS — IMO0001 Reserved for inherently not codable concepts without codable children: Secondary | ICD-10-CM

## 2017-06-05 MED ORDER — VITAMIN D (ERGOCALCIFEROL) 1.25 MG (50000 UNIT) PO CAPS: 50000.0000 [IU] | ORAL_CAPSULE | ORAL | 0 refills | Status: DC

## 2017-06-05 MED ORDER — BUPROPION HCL ER (SR) 200 MG PO TB12: 200.0000 mg | ORAL_TABLET | Freq: Every day | ORAL | 0 refills | Status: DC

## 2017-06-05 MED FILL — VIT D2 1.25 MG (50,000 UNIT: 1.25 MG | 28 days supply | Qty: 4 | Fill #0

## 2017-06-05 MED FILL — BUPROPION HCL SR 200 MG TAB: 200 | 30 days supply | Qty: 30 | Fill #0

## 2017-06-05 NOTE — Progress Notes (Signed)
Office: [phone]  /  Fax: [phone]   HPI:   Chief Complaint: OBESITY Cristina [name] is here to discuss her progress with her obesity treatment plan. She is on the  follow the Category 3 plan and is following her eating plan approximately 80 % of the time. She states she is exercising 0 minutes 0 times per week. [name] struggled with weight loss since last visit.  She has not been planning ahead as well. She is ready to get back on track. noticed hunger well controlled but increase in emotional eating.   Her weight is 223 lb (101.2 kg) today and has gained 3 lbs since her last visit. She has lost 1 lbs since starting treatment with [name].  Vitamin D deficiency [name] has a diagnosis of vitamin D deficiency. She is currently taking vit D and denies nausea, vomiting or muscle weakness.  Depression with emotional eating behaviors [name] is struggling with emotional eating and using food for comfort to the extent that it is negatively impacting her health. She often snacks when she is not hungry. [name] sometimes feels she is out of control and then feels guilty that she made poor food choices. She has been working on behavior modification techniques to help reduce her emotional eating and has been somewhat successful. She shows no sign of suicidal or homicidal ideations.  Depression screen [facility] 2/9 05/21/2017 04/23/2017 02/12/2017 12/18/2016 12/12/2016  Decreased Interest 0 3 0 0 0  Down, Depressed, Hopeless 0 3 0 0 1  PHQ - 2 Score 0 6 0 0 1  Altered sleeping - 3 - - -  Tired, decreased energy - 3 - - -  Change in appetite - 2 - - -  Feeling bad or failure about yourself  - 1 - - -  Trouble concentrating - 1 - - -  Moving slowly or fidgety/restless - 1 - - -  Suicidal thoughts - - - - -  PHQ-9 Score - 17 - - -  Difficult doing work/chores - - - - -  Some recent data might be hidden    At risk for Osteoprosis [name] is at higher risk of osteopenia and osteoporosis due to vitamin D deficiency.      ALLERGIES: Allergies  Allergen Reactions  . Fish Oil Rash    MEDICATIONS: Current Outpatient Prescriptions on File Prior to Visit  Medication Sig Dispense Refill  . acyclovir (ZOVIRAX) 400 MG tablet TAKE 1 TABLET BY MOUTH 2 TIMES DAILY. 180 tablet 3  . albuterol (PROAIR HFA) 108 (90 Base) MCG/ACT inhaler Inhale 2 puffs into the lungs every 6 (six) hours as needed. 18 g 1  . AMBULATORY NON FORMULARY MEDICATION Reported on 10/26/2015    . ANUCORT-HC 25 MG suppository PLACE 1 SUPPOSITORY RECTALLY 2 TIMES DAILY AS NEEDED FOR HEMORRHOIDS. 25 suppository 5  . aspirin EC 81 MG tablet Take 81 mg by mouth daily.    [name] atorvastatin (LIPITOR) 20 MG tablet Take 1 tablet (20 mg total) by mouth daily. 90 tablet 3  . azelastine (ASTELIN) 0.1 % nasal spray PLACE 2 SPRAYS INTO BOTH NOSTRILS 2 TIMES DAILY AS DIRECTED 90 mL 3  . azelastine (OPTIVAR) 0.05 % ophthalmic solution Place 1 drop into both eyes daily. PRN  3  . BIOTIN 5000 PO Take by mouth.    . Calcium Carbonate-Vitamin D (CALCIUM + D PO) Take 1 tablet by mouth daily.    . canagliflozin (INVOKANA) 300 MG TABS tablet Take 1 tablet (300 mg total) by mouth  daily before breakfast. 90 tablet 3  . cetirizine (ZYRTEC) 10 MG tablet Take 10 mg by mouth daily.    [name] diltiazem 2 % GEL Apply 1 application topically 3 (three) times daily. 1 Package 3  . docusate sodium (COLACE) 100 MG capsule Take 100 mg by mouth as needed. Reported on 05/04/2016    . ferrous sulfate 325 (65 FE) MG EC tablet Take 325 mg by mouth daily.     . fluticasone (FLONASE) 50 MCG/ACT nasal spray PLACE 2 SPRAYS INTO BOTH NOSTRILS DAILY. 48 g 3  . gabapentin (NEURONTIN) 300 MG capsule Take 1 capsule (300 mg total) by mouth 3 (three) times daily. 270 capsule 1  . glipiZIDE (GLIPIZIDE XL) 10 MG 24 hr tablet TAKE 1 TABLET BY MOUTH DAILY WITH BREAKFAST. 90 tablet 3  . Glucosamine HCl 1000 MG TABS Take 1,000 mg by mouth 2 (two) times daily with a meal.     . glucose blood test strip  accucheck guide strips and fast clix lancets Use as instructed dm 2 100 each 1  . hydrocortisone 2.5 % ointment Apply topically 2 (two) times daily. 30 g 0  . Insulin Pen Needle (PEN NEEDLES) 31G X 5 MM MISC 1 each by Does not apply route daily. USE TO INJECT VICTOZA DAILY AS DIRECTED 100 each 3  . liraglutide 18 MG/3ML SOPN Inject 0.3 mLs (1.8 mg total) into the skin daily. 6 mL 11  . lisinopril (PRINIVIL,ZESTRIL) 5 MG tablet TAKE 1 TABLET (5 MG TOTAL) BY MOUTH DAILY. (Patient not taking: Reported on 05/21/2017) 30 tablet 0  . metFORMIN (GLUCOPHAGE) 1000 MG tablet TAKE 1 TABLET BY MOUTH 2 TIMES DAILY WITH A MEAL. 180 tablet 3  . montelukast (SINGULAIR) 10 MG tablet Take 1 tablet (10 mg total) by mouth at bedtime. 30 tablet 0  . Multiple Vitamin (MULTIVITAMIN) capsule Take 1 capsule by mouth daily.    . nitroGLYCERIN (NITROSTAT) 0.4 MG SL tablet Place 1 tablet (0.4 mg total) under the tongue every 5 (five) minutes as needed for chest pain. 20 tablet 0  . pantoprazole (PROTONIX) 40 MG tablet TAKE 1 TABLET BY MOUTH 2 TIMES DAILY. 180 tablet 3  . rOPINIRole (REQUIP) 1 MG tablet Take 1 tablet (1 mg total) by mouth at bedtime. 90 tablet 3  . sertraline (ZOLOFT) 100 MG tablet Take 1.5 tablets (150 mg total) by mouth daily. 135 tablet 3  . TRUEPLUS LANCETS 30G MISC USE TO CHECK BLOOD GLUCOSE DAILY AS DIRECTED 100 each 3   Current Facility-Administered Medications on File Prior to Visit  Medication Dose Route Frequency Provider Last Rate Last Dose  . technetium tetrofosmin (TC-MYOVIEW) injection 29.7 millicurie  98.9 millicurie Intravenous Once PRN Larey Dresser, MD        PAST MEDICAL HISTORY: Past Medical History:  Diagnosis Date  . Allergic rhinitis, cause unspecified   . Anemia   . Arthritis   . Asthma   . Back pain   . Chest pain   . Constipation   . CTS (carpal tunnel syndrome)   . Cystocele   . Depression   . Diabetes mellitus   . Dysfunction of eustachian tube   . Fatty liver   .  Fissure, anal   . Gallbladder problem   . Genital herpes   . GERD (gastroesophageal reflux disease)   . Hemorrhoid   . Hx of migraine headaches   . Hyperlipidemia   . Hypertension   . IBS (irritable bowel syndrome)   . Insomnia   .  Iron deficiency anemia, unspecified   . Joint pain   . Nausea   . Obesity   . OSA on CPAP   . Osteoarthritis   . Rectocele   . Sleep apnea   . TMJ (dislocation of temporomandibular joint)     PAST SURGICAL HISTORY: Past Surgical History:  Procedure Laterality Date  . ABDOMINAL HYSTERECTOMY  11/11/2010   [place] Plan.  Ovaries intact.  Uterine fibroids with DUB.  [name] CARPAL TUNNEL RELEASE  1989   Left  . CHOLECYSTECTOMY    . CHOLECYSTECTOMY    . COLONOSCOPY  06/29/2012   normal.  Repeat in 5 years.  [name] [place]  . ESOPHAGOGASTRODUODENOSCOPY  12/13/2011   dysphagia.  Henrene Pastor.  Normal.  . Sleep study  09/11/2012   severe OSA.  CPAP titration at 12 cm water pressure.  . TUBAL LIGATION  1988    SOCIAL HISTORY: Social History  Substance Use Topics  . Smoking status: Former Research scientist (life sciences)  . Smokeless tobacco: Never Used     Comment: smoked occasionally longest 6 mos.  . Alcohol use No    FAMILY HISTORY: Family History  Problem Relation Age of Onset  . Hypertension Mother 14  . Osteoarthritis Mother   . Diabetes Mother   . Colon cancer Mother 19  . Cancer Mother 89       colon cancer  . Hyperlipidemia Mother   . Obesity Mother   . Sudden death Mother   . Arthritis Mother   . Colon cancer Paternal Grandmother   . Cancer Paternal Grandmother        colon cancer  . Stroke Maternal Grandmother   . Colon polyps Brother        x 2 brother  . Diabetes Daughter   . Hypertension Daughter   . Sleep apnea Daughter   . Mental illness Daughter        depression  . Migraines Daughter   . Cirrhosis Brother        alcoholism  . Alcohol abuse Brother   . Migraines Daughter   . Mental illness Daughter        anxiety attacks  .  Protein S deficiency Daughter   . Sleep apnea Brother   . Hyperlipidemia Brother   . Migraines Brother   . Obstructive Sleep Apnea Brother   . Colon cancer Paternal Aunt     ROS: Review of Systems  Gastrointestinal: Negative for nausea and vomiting.  Musculoskeletal: Negative.        Muscle weakness  Endo/Heme/Allergies:       Negative polyphagia  Psychiatric/Behavioral: Positive for depression. Negative for suicidal ideas.    PHYSICAL EXAM: Blood pressure 102/69, pulse 77, temperature 97.9 F (36.6 C), weight 223 lb (101.2 kg), SpO2 100 %. Body mass index is 42.14 kg/m. Physical Exam  Constitutional: She is oriented to person, place, and time. She appears well-developed and well-nourished.  Cardiovascular: Normal rate.   Pulmonary/Chest: Effort normal.  Musculoskeletal: Normal range of motion.  Neurological: She is alert and oriented to person, place, and time.  Skin: Skin is warm and dry.  Psychiatric: She has a normal mood and affect.    RECENT LABS AND TESTS: BMET    Component Value Date/Time   NA 137 04/23/2017 1002   K 4.4 04/23/2017 1002   CL 98 04/23/2017 1002   CO2 22 04/23/2017 1002   GLUCOSE 109 (H) 04/23/2017 1002   GLUCOSE 123 (H) 08/27/2016 1157   BUN 17 04/23/2017 1002  CREATININE 0.81 04/23/2017 1002   CREATININE 0.81 08/27/2016 1157   CALCIUM 9.4 04/23/2017 1002   GFRNONAA 83 04/23/2017 1002   GFRNONAA 88 01/11/2015 0936   GFRAA 95 04/23/2017 1002   GFRAA >89 01/11/2015 0936   Lab Results  Component Value Date   HGBA1C 7.3 (H) 04/23/2017   HGBA1C 8.2 02/12/2017   HGBA1C 7.7 12/07/2016   HGBA1C 7.1 (H) 08/27/2016   HGBA1C 8.4 (H) 05/04/2016   Lab Results  Component Value Date   INSULIN 31.3 (H) 04/23/2017   CBC    Component Value Date/Time   WBC 11.6 (H) 04/23/2017 1002   WBC 7.7 12/12/2016 1145   WBC 14.1 (H) 11/07/2016 1018   RBC 5.00 04/23/2017 1002   RBC 4.86 12/12/2016 1145   RBC 5.06 11/07/2016 1018   HGB 14.0 04/23/2017  1002   HCT 41.7 04/23/2017 1002   PLT 332 02/12/2017 1052   MCV 83 04/23/2017 1002   MCH 28.0 04/23/2017 1002   MCH 28.3 12/12/2016 1145   MCH 27.5 11/07/2016 1018   MCHC 33.6 04/23/2017 1002   MCHC 33.9 12/12/2016 1145   MCHC 32.3 11/07/2016 1018   RDW 16.0 (H) 04/23/2017 1002   LYMPHSABS 4.6 (H) 04/23/2017 1002   MONOABS 846 11/07/2016 1018   EOSABS 0.2 04/23/2017 1002   BASOSABS 0.0 04/23/2017 1002   Iron/TIBC/Ferritin/ %Sat    Component Value Date/Time   IRON 30 (L) 01/25/2013 1050   TIBC 375 11/23/2012 1345   FERRITIN 10 11/23/2012 1345   IRONPCTSAT 12 (L) 11/23/2012 1345   Lipid Panel     Component Value Date/Time   CHOL 226 (H) 04/23/2017 1002   TRIG 196 (H) 04/23/2017 1002   HDL 46 04/23/2017 1002   CHOLHDL 4.2 02/12/2017 1052   CHOLHDL 3.7 08/27/2016 1157   VLDL 23 08/27/2016 1157   LDLCALC 141 (H) 04/23/2017 1002   Hepatic Function Panel     Component Value Date/Time   PROT 7.6 04/23/2017 1002   ALBUMIN 4.5 04/23/2017 1002   AST 62 (H) 04/23/2017 1002   ALT 72 (H) 04/23/2017 1002   ALKPHOS 83 04/23/2017 1002   BILITOT 0.3 04/23/2017 1002      Component Value Date/Time   TSH 1.420 04/23/2017 1002   TSH 1.33 11/07/2016 1018   TSH 1.78 05/04/2016 0958    ASSESSMENT AND PLAN: Vitamin D deficiency - Plan: Vitamin D, Ergocalciferol, (DRISDOL) 50000 units CAPS capsule  Other depression - Plan: buPROPion (WELLBUTRIN SR) 200 MG 12 hr tablet  At risk for osteoporosis  Class 3 obesity with serious comorbidity and body mass index (BMI) of 40.0 to 44.9 in adult, unspecified obesity type ([place])  PLAN:  Vitamin D Deficiency Teah was informed that low vitamin D levels contributes to fatigue and are associated with obesity, breast, and colon cancer. She agrees to continue to take prescription Vit D @50 ,000 IU every week, a refill was written today, and will follow up for routine testing of vitamin D, at least 2-3 times per year. She was informed of the risk of  over-replacement of vitamin D and agrees to not increase her dose unless he discusses this with [name] first.  Depression with Emotional Eating Behaviors We discussed behavior modification techniques today to help Desmond deal with her emotional eating and depression. She has agreed to increase Wellbutrin SR to 200 mg qd and agreed to follow up as directed.  Obesity Darshana is currently in the action stage of change. As such, her goal is to  continue with weight loss efforts She has agreed to follow the Category 3 plan Amonda has been instructed to work up to a goal of 150 minutes of combined cardio and strengthening exercise per week for weight loss and overall health benefits. We discussed the following Behavioral Modification Stratagies today: increasing lean protein intake, work on meal planning and easy cooking plans and emotional eating strategies   At risk for osteopenia Margree is at risk for osteopenia and osteoporsis due to her vitamin D deficiency. She was encouraged to take her vitamin D and follow her higher calcium diet and increase strengthening exercise to help strengthen her bones and decrease her risk of osteopenia and osteoporosis.   Nylani has agreed to follow up with our clinic in 2 weeks. She was informed of the importance of frequent follow up visits to maximize her success with intensive lifestyle modifications for her multiple health conditions.   Office: [phone]  /  Fax: [phone]  OBESITY BEHAVIORAL INTERVENTION VISIT  Today's visit was # 4 out of 22.  Starting weight: 224 Starting date: 04/23/2017   Today's weight : Weight: 223 lb (101.2 kg)  Today's date: 06/05/2017 Total lbs lost to date: 1 (Patients must lose 7 lbs in the first 6 months to continue with counseling)   ASK: We discussed the diagnosis of obesity with [name] today and Antaniya agreed to give [name] permission to discuss obesity behavioral modification therapy today.  ASSESS: Kaylanie has the diagnosis  of obesity and her BMI today is 41.7 Solyana is in the action stage of change   ADVISE: Rissa was educated on the multiple health risks of obesity as well as the benefit of weight loss to improve her health. She was advised of the need for long term treatment and the importance of lifestyle modifications.  AGREE: Multiple dietary modification options and treatment options were discussed and  Corrine agreed to follow the Category 3 plan We discussed the following Behavioral Modification Stratagies today: increasing lean protein intake, work on meal planning and easy cooking plans and emotional eating strategies     I have reviewed the above documentation for accuracy and completeness, and I agree with the above. -Lacy Duverney, PA-C  I have reviewed the above note and agree with the plan. -Dennard Nip, MD

## 2017-06-09 MED FILL — VICTOZA 18 MG/3 ML INJECT P: 18 | 30 days supply | Qty: 9 | Fill #0

## 2017-06-10 ENCOUNTER — Encounter (INDEPENDENT_AMBULATORY_CARE_PROVIDER_SITE_OTHER): Payer: Self-pay | Admitting: Family Medicine

## 2017-06-10 MED FILL — metFORMIN HCL 1000 MG TABS: 1000 | 90 days supply | Qty: 180 | Fill #0

## 2017-06-10 MED FILL — ACCU-CHEK GUIDE TEST STRIP: 30 days supply | Qty: 100 | Fill #1

## 2017-06-10 MED FILL — ACCU-CHEK FASTCLIX LANCETS: 30 days supply | Qty: 102 | Fill #1

## 2017-06-10 MED FILL — MONTELUKAST SOD 10 MG TAB: 10 | 30 days supply | Qty: 30 | Fill #0

## 2017-06-10 MED FILL — INVOKANA 300 MG TABLET: 300 | 90 days supply | Qty: 90 | Fill #0

## 2017-06-10 MED FILL — SERTRALINE HCL 100 MG TAB: 100 | 90 days supply | Qty: 135 | Fill #0

## 2017-06-10 MED FILL — rOPINIRole HCL 1 MG TABS: 1 | 90 days supply | Qty: 90 | Fill #0

## 2017-06-13 ENCOUNTER — Encounter: Payer: 59 | Admitting: Internal Medicine

## 2017-06-13 ENCOUNTER — Ambulatory Visit (AMBULATORY_SURGERY_CENTER): Payer: 59 | Admitting: Internal Medicine

## 2017-06-13 ENCOUNTER — Encounter: Payer: Self-pay | Admitting: Internal Medicine

## 2017-06-13 VITALS — BP 127/75 | HR 82 | Temp 97.5°F | Resp 12 | Ht 61.0 in | Wt 223.0 lb

## 2017-06-13 DIAGNOSIS — Z1211 Encounter for screening for malignant neoplasm of colon: Secondary | ICD-10-CM

## 2017-06-13 DIAGNOSIS — Z800 Family history of malignant neoplasm of digestive organs: Secondary | ICD-10-CM | POA: Diagnosis present

## 2017-06-13 DIAGNOSIS — G4733 Obstructive sleep apnea (adult) (pediatric): Secondary | ICD-10-CM | POA: Diagnosis not present

## 2017-06-13 DIAGNOSIS — Z1212 Encounter for screening for malignant neoplasm of rectum: Secondary | ICD-10-CM | POA: Diagnosis not present

## 2017-06-13 DIAGNOSIS — I10 Essential (primary) hypertension: Secondary | ICD-10-CM | POA: Diagnosis not present

## 2017-06-13 MED ORDER — SODIUM CHLORIDE 0.9 % IV SOLN: 500.0000 mL | INTRAVENOUS | Status: DC

## 2017-06-13 NOTE — Patient Instructions (Signed)
YOU HAD AN ENDOSCOPIC PROCEDURE TODAY AT [facility]:   Refer to the procedure report that was given to you for any specific questions about what was found during the examination.  If the procedure report does not answer your questions, please call your gastroenterologist to clarify.  If you requested that your care partner not be given the details of your procedure findings, then the procedure report has been included in a sealed envelope for you to review at your convenience later.  YOU SHOULD EXPECT: Some feelings of bloating in the abdomen. Passage of more gas than usual.  Walking can help get rid of the air that was put into your GI tract during the procedure and reduce the bloating. If you had a lower endoscopy (such as a colonoscopy or flexible sigmoidoscopy) you may notice spotting of blood in your stool or on the toilet paper. If you underwent a bowel prep for your procedure, you may not have a normal bowel movement for a few days.  Please Note:  You might notice some irritation and congestion in your nose or some drainage.  This is from the oxygen used during your procedure.  There is no need for concern and it should clear up in a day or so.  SYMPTOMS TO REPORT IMMEDIATELY:   Following lower endoscopy (colonoscopy or flexible sigmoidoscopy):  Excessive amounts of blood in the stool  Significant tenderness or worsening of abdominal pains  Swelling of the abdomen that is new, acute  Fever of 100F or higher   For urgent or emergent issues, a gastroenterologist can be reached at any hour by calling [phone].   DIET:  We do recommend a small meal at first, but then you may proceed to your regular diet.  Drink plenty of fluids but you should avoid alcoholic beverages for 24 hours.  ACTIVITY:  You should plan to take it easy for the rest of today and you should NOT DRIVE or use heavy machinery until tomorrow (because of the sedation medicines used during the test).     FOLLOW UP: Our staff will call the number listed on your records the next business day following your procedure to check on you and address any questions or concerns that you may have regarding the information given to you following your procedure. If we do not reach you, we will leave a message.  However, if you are feeling well and you are not experiencing any problems, there is no need to return our call.  We will assume that you have returned to your regular daily activities without incident.  If any biopsies were taken you will be contacted by phone or by letter within the next 1-3 weeks.  Please call us at [phone] if you have not heard about the biopsies in 3 weeks.    SIGNATURES/CONFIDENTIALITY: You and/or your care partner have signed paperwork which will be entered into your electronic medical record.  These signatures attest to the fact that that the information above on your After Visit Summary has been reviewed and is understood.  Full responsibility of the confidentiality of this discharge information lies with you and/or your care-partner.    Handout was given to your care partner on hemorrhoids. Your blood sugar was 104 in the recovery room. You may resume your current medications today. Repeat colonoscopy in 5 years for screening purposes. Please call if any questions or concerns.

## 2017-06-13 NOTE — Progress Notes (Signed)
No problems noted in the recovery room. maw 

## 2017-06-13 NOTE — Op Note (Signed)
[facility] Patient Name: Cristina [name] Procedure Date: 06/13/2017 1:10 PM MRN: [id number] Endoscopist: Docia Chuck. Henrene Pastor , MD Age: 56 Referring MD:  Date of Birth: 1960/11/17 Gender: Female Account #: [id number] Procedure:                Colonoscopy Indications:              Screening in patient at increased risk: Family                            history of 1st-degree relative with colorectal                            cancer (parent 37s), Colon cancer screening in                            patient at increased risk: Family history of                            colorectal cancer in multiple 2nd degree relatives.                            Previous examinations 2008 and 2013 negative for                            neoplasia Medicines:                Monitored Anesthesia Care Procedure:                Pre-Anesthesia Assessment:                           - Prior to the procedure, a History and Physical                            was performed, and patient medications and                            allergies were reviewed. The patient's tolerance of                            previous anesthesia was also reviewed. The risks                            and benefits of the procedure and the sedation                            options and risks were discussed with the patient.                            All questions were answered, and informed consent                            was obtained. Prior Anticoagulants: The patient has  taken no previous anticoagulant or antiplatelet                            agents. ASA Grade Assessment: II - A patient with                            mild systemic disease. After reviewing the risks                            and benefits, the patient was deemed in                            satisfactory condition to undergo the procedure.                           After obtaining informed consent, the colonoscope           was passed under direct vision. Throughout the                            procedure, the patient's blood pressure, pulse, and                            oxygen saturations were monitored continuously. The                            Colonoscope was introduced through the anus and                            advanced to the the cecum, identified by                            appendiceal orifice and ileocecal valve. The                            ileocecal valve, appendiceal orifice, and rectum                            were photographed. The quality of the bowel                            preparation was excellent. The colonoscopy was                            performed without difficulty. The patient tolerated                            the procedure well. The bowel preparation used was                            SUPREP. Scope In: 1:31:48 PM Scope Out: 1:44:00 PM Scope Withdrawal Time: 0 hours 8 minutes 55 seconds  Total Procedure Duration: 0 hours 12 minutes 12 seconds  Findings:  Internal hemorrhoids were found during retroflexion.                           The exam was otherwise without abnormality on                            direct and retroflexion views. Complications:            No immediate complications. Estimated blood loss:                            None. Estimated Blood Loss:     Estimated blood loss: none. Impression:               - Internal hemorrhoids.                           - The examination was otherwise normal on direct                            and retroflexion views.                           - No specimens collected. Recommendation:           - Repeat colonoscopy in 5 years for screening                            purposes.                           - Patient has a contact number available for                            emergencies. The signs and symptoms of potential                            delayed complications were discussed with  the                            patient. Return to normal activities tomorrow.                            Written discharge instructions were provided to the                            patient.                           - Resume previous diet.                           - Continue present medications. Docia Chuck. Henrene Pastor, MD 06/13/2017 1:52:05 PM This report has been signed electronically.

## 2017-06-13 NOTE — Progress Notes (Signed)
To recovery, report to Willis, RN, VSS 

## 2017-06-13 NOTE — Progress Notes (Signed)
Pt's states no medical or surgical changes since previsit or office visit. 

## 2017-06-16 ENCOUNTER — Telehealth: Payer: Self-pay

## 2017-06-16 NOTE — Telephone Encounter (Signed)
  Follow up Call-  Call back number 06/13/2017  Post procedure Call Back phone  # [phone]  Permission to leave phone message Yes  Some recent data might be hidden     Patient questions:  Do you have a fever, pain , or abdominal swelling? No. Pain Score  0 *  Have you tolerated food without any problems? Yes.    Have you been able to return to your normal activities? Yes.    Do you have any questions about your discharge instructions: Diet   No. Medications  No. Follow up visit  No.  Do you have questions or concerns about your Care? No.  Actions: * If pain score is 4 or above: No action needed, pain <4.  No problems noted per pt. maw

## 2017-06-17 ENCOUNTER — Ambulatory Visit (INDEPENDENT_AMBULATORY_CARE_PROVIDER_SITE_OTHER): Payer: 59 | Admitting: Physical Medicine and Rehabilitation

## 2017-06-17 DIAGNOSIS — R202 Paresthesia of skin: Secondary | ICD-10-CM | POA: Diagnosis not present

## 2017-06-17 NOTE — Progress Notes (Deleted)
Right hand pain and numbness. States all fingers in right hand hurt. Gripping steering wheel increases numbness in fingers and hand. Limited strength in right hand.

## 2017-06-18 NOTE — Procedures (Signed)
EMG & NCV Findings: Evaluation of the right median motor nerve showed prolonged distal onset latency (7.9 ms) and decreased conduction velocity (Elbow-Wrist, 41 m/s).  The right median (across palm) sensory nerve showed no response (Palm), prolonged distal peak latency (12.6 ms), and reduced amplitude (6.9 V).  All remaining nerves (as indicated in the following tables) were within normal limits.    All examined muscles (as indicated in the following table) showed no evidence of electrical instability.    Impression: The above electrodiagnostic study is ABNORMAL and reveals evidence of a moderate to severe right median nerve entrapment at the wrist (carpal tunnel syndrome) affecting sensory and motor components.   There is no significant electrodiagnostic evidence of any other focal nerve entrapment, brachial plexopathy or cervical radiculopathy.   Recommendations: 1.  Follow-up with referring physician. 2.  Continue current management of symptoms. 3.  Suggest surgical evaluation.   Nerve Conduction Studies Anti Sensory Summary Table   Stim Site NR Peak (ms) Norm Peak (ms) P-T Amp (V) Norm P-T Amp Site1 Site2 Delta-P (ms) Dist (cm) Vel (m/s) Norm Vel (m/s)  Right Median Acr Palm Anti Sensory (2nd Digit)  31C  Wrist    *12.6 <3.6 *6.9 >10 Wrist Palm  0.0    Palm *NR  <2.0          Right Radial Anti Sensory (Base 1st Digit)  31.7C  Wrist    1.8 <3.1 32.7  Wrist Base 1st Digit 1.8 0.0    Right Ulnar Anti Sensory (5th Digit)  31.3C  Wrist    3.1 <3.7 21.9 >15.0 Wrist 5th Digit 3.1 14.0 45 >38   Motor Summary Table   Stim Site NR Onset (ms) Norm Onset (ms) O-P Amp (mV) Norm O-P Amp Site1 Site2 Delta-0 (ms) Dist (cm) Vel (m/s) Norm Vel (m/s)  Right Median Motor (Abd Poll Brev)  31.8C  Wrist    *7.9 <4.2 5.5 >5 Elbow Wrist 4.4 18.0 *41 >50  Elbow    12.3  4.9         Right Ulnar Motor (Abd Dig Min)  31.5C  Wrist    3.0 <4.2 9.2 >3 B Elbow Wrist 3.1 18.0 58 >53  B Elbow    6.1  8.3   A Elbow B Elbow 1.6 9.5 59 >53  A Elbow    7.7  8.1          EMG   Side Muscle Nerve Root Ins Act Fibs Psw Amp Dur Poly Recrt Int Fraser Din Comment  Right 1stDorInt Ulnar C8-T1 Nml Nml Nml Nml Nml 0 Nml Nml   Right Abd Poll Brev Median C8-T1 Nml Nml Nml Nml Nml 0 Nml Nml   Right ExtDigCom   Nml Nml Nml Nml Nml 0 Nml Nml   Right Triceps Radial C6-7-8 Nml Nml Nml Nml Nml 0 Nml Nml   Right Deltoid Axillary C5-6 Nml Nml Nml Nml Nml 0 Nml Nml     Nerve Conduction Studies Anti Sensory Left/Right Comparison   Stim Site L Lat (ms) R Lat (ms) L-R Lat (ms) L Amp (V) R Amp (V) L-R Amp (%) Site1 Site2 L Vel (m/s) R Vel (m/s) L-R Vel (m/s)  Median Acr Palm Anti Sensory (2nd Digit)  31C  Wrist  *12.6   *6.9  Wrist Palm     Palm             Radial Anti Sensory (Base 1st Digit)  31.7C  Wrist  1.8   32.7  Wrist  Base 1st Digit     Ulnar Anti Sensory (5th Digit)  31.3C  Wrist  3.1   21.9  Wrist 5th Digit  45    Motor Left/Right Comparison   Stim Site L Lat (ms) R Lat (ms) L-R Lat (ms) L Amp (mV) R Amp (mV) L-R Amp (%) Site1 Site2 L Vel (m/s) R Vel (m/s) L-R Vel (m/s)  Median Motor (Abd Poll Brev)  31.8C  Wrist  *7.9   5.5  Elbow Wrist  *41   Elbow  12.3   4.9        Ulnar Motor (Abd Dig Min)  31.5C  Wrist  3.0   9.2  B Elbow Wrist  58   B Elbow  6.1   8.3  A Elbow B Elbow  59   A Elbow  7.7   8.1           Waveforms:

## 2017-06-18 NOTE — Progress Notes (Signed)
CYMONE YESKE - 56 y.o. female MRN [id number]  Date of birth: [birth year]  Office Visit Note: Visit Date: 06/17/2017 PCP: [name], MD Referred by: [name], MD  Subjective: No chief complaint on file.  HPI: Mrs. Prescott is a 56 year old right-hand dominant female who reports chronic worsening severe at times right hand pain and numbness. This is worse with activity and worse at night. She really thinks that the whole hand is numb globally. If he really force her to document where she feels most of numbness and is in the radial side of the hand. She's had a prior left-sided carpal tunnel release on most 20 years ago. She's really unsure exactly when it happened. She did have electrodiagnostic study of the time. She did not have the right hand surgery at the time because she felt like she did not have the time to be able to rehabilitate if she had surgery. She reports decreased grip strength on the right. Again using the steering wheel or gripping causes the numbness.    ROS Otherwise per HPI.  Assessment & Plan: Visit Diagnoses:  1. Paresthesia of skin     Plan: No additional findings.  Impression: The above electrodiagnostic study is ABNORMAL and reveals evidence of a moderate to severe right median nerve entrapment at the wrist (carpal tunnel syndrome) affecting sensory and motor components.   There is no significant electrodiagnostic evidence of any other focal nerve entrapment, brachial plexopathy or cervical radiculopathy.   Recommendations: 1.  Follow-up with referring physician. 2.  Continue current management of symptoms. 3.  Suggest surgical evaluation.  Meds & Orders: No orders of the defined types were placed in this encounter.   Orders Placed This Encounter  Procedures  . NCV with EMG (electromyography)    Follow-up: Return for Dr. Louanne Skye.   Procedures: No procedures performed  EMG & NCV Findings: Evaluation of the right median motor nerve showed  prolonged distal onset latency (7.9 ms) and decreased conduction velocity (Elbow-Wrist, 41 m/s).  The right median (across palm) sensory nerve showed no response (Palm), prolonged distal peak latency (12.6 ms), and reduced amplitude (6.9 V).  All remaining nerves (as indicated in the following tables) were within normal limits.    All examined muscles (as indicated in the following table) showed no evidence of electrical instability.    Impression: The above electrodiagnostic study is ABNORMAL and reveals evidence of a moderate to severe right median nerve entrapment at the wrist (carpal tunnel syndrome) affecting sensory and motor components.   There is no significant electrodiagnostic evidence of any other focal nerve entrapment, brachial plexopathy or cervical radiculopathy.   Recommendations: 1.  Follow-up with referring physician. 2.  Continue current management of symptoms. 3.  Suggest surgical evaluation.   Nerve Conduction Studies Anti Sensory Summary Table   Stim Site NR Peak (ms) Norm Peak (ms) P-T Amp (V) Norm P-T Amp Site1 Site2 Delta-P (ms) Dist (cm) Vel (m/s) Norm Vel (m/s)  Right Median Acr Palm Anti Sensory (2nd Digit)  31C  Wrist    *12.6 <3.6 *6.9 >10 Wrist Palm  0.0    Palm *NR  <2.0          Right Radial Anti Sensory (Base 1st Digit)  31.7C  Wrist    1.8 <3.1 32.7  Wrist Base 1st Digit 1.8 0.0    Right Ulnar Anti Sensory (5th Digit)  31.3C  Wrist    3.1 <3.7 21.9 >15.0 Wrist 5th Digit 3.1 14.0 45 >  38   Motor Summary Table   Stim Site NR Onset (ms) Norm Onset (ms) O-P Amp (mV) Norm O-P Amp Site1 Site2 Delta-0 (ms) Dist (cm) Vel (m/s) Norm Vel (m/s)  Right Median Motor (Abd Poll Brev)  31.8C  Wrist    *7.9 <4.2 5.5 >5 Elbow Wrist 4.4 18.0 *41 >50  Elbow    12.3  4.9         Right Ulnar Motor (Abd Dig Min)  31.5C  Wrist    3.0 <4.2 9.2 >3 B Elbow Wrist 3.1 18.0 58 >53  B Elbow    6.1  8.3  A Elbow B Elbow 1.6 9.5 59 >53  A Elbow    7.7  8.1           EMG   Side Muscle Nerve Root Ins Act Fibs Psw Amp Dur Poly Recrt Int Fraser Din Comment  Right 1stDorInt Ulnar C8-T1 Nml Nml Nml Nml Nml 0 Nml Nml   Right Abd Poll Brev Median C8-T1 Nml Nml Nml Nml Nml 0 Nml Nml   Right ExtDigCom   Nml Nml Nml Nml Nml 0 Nml Nml   Right Triceps Radial C6-7-8 Nml Nml Nml Nml Nml 0 Nml Nml   Right Deltoid Axillary C5-6 Nml Nml Nml Nml Nml 0 Nml Nml     Nerve Conduction Studies Anti Sensory Left/Right Comparison   Stim Site L Lat (ms) R Lat (ms) L-R Lat (ms) L Amp (V) R Amp (V) L-R Amp (%) Site1 Site2 L Vel (m/s) R Vel (m/s) L-R Vel (m/s)  Median Acr Palm Anti Sensory (2nd Digit)  31C  Wrist  *12.6   *6.9  Wrist Palm     Palm             Radial Anti Sensory (Base 1st Digit)  31.7C  Wrist  1.8   32.7  Wrist Base 1st Digit     Ulnar Anti Sensory (5th Digit)  31.3C  Wrist  3.1   21.9  Wrist 5th Digit  45    Motor Left/Right Comparison   Stim Site L Lat (ms) R Lat (ms) L-R Lat (ms) L Amp (mV) R Amp (mV) L-R Amp (%) Site1 Site2 L Vel (m/s) R Vel (m/s) L-R Vel (m/s)  Median Motor (Abd Poll Brev)  31.8C  Wrist  *7.9   5.5  Elbow Wrist  *41   Elbow  12.3   4.9        Ulnar Motor (Abd Dig Min)  31.5C  Wrist  3.0   9.2  B Elbow Wrist  58   B Elbow  6.1   8.3  A Elbow B Elbow  59   A Elbow  7.7   8.1           Waveforms:            Clinical History: Lspine MRI 02/29/2016 IMPRESSION: 1. 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 2. Mild right subarticular and foraminal stenosis at L4-5. 3. Chronic endplate changes anteriorly at L1-2 with a mild broad-based disc protrusion which partially effaces the ventral CSF but does not create any significant focal stenosis. 4. Multilevel facet hypertrophy throughout the lumbar spine, most prominent at L4-5.  She reports that she has quit smoking. She  has never used smokeless tobacco.   Recent Labs  12/07/16 0907 02/12/17 0952 04/23/17 1002  HGBA1C  7.7 8.2 7.3*    Objective:  VS:  HT:    WT:   BMI:     BP:   HR: bpm  TEMP: ( )  RESP:  Physical Exam  Musculoskeletal:  Inspection reveals some flattening of the right APB but no significant atrophy of the bilateral APB or FDI or hand intrinsics. There is no swelling, color changes, allodynia or dystrophic changes. There is 5 out of 5 strength in the bilateral wrist extension, finger abduction and long finger flexion. There is decreased sensation to light touch in the median nerve distribution on the right.    Ortho Exam Imaging: No results found.  Past Medical/Family/Surgical/Social History: Medications & Allergies reviewed per EMR Patient Active Problem List   Diagnosis Date Noted  . Type 2 diabetes mellitus without complication, without long-term current use of insulin ([place]) 05/22/2017  . Class 3 obesity with serious comorbidity and body mass index (BMI) of 40.0 to 44.9 in adult ([place]) 05/21/2017  . Family history of colon cancer 04/01/2017  . LUQ abdominal pain 04/01/2017  . Irritable bowel syndrome with diarrhea 04/01/2017  . NSAID long-term use 04/01/2017  . Nausea without vomiting 04/01/2017  . Lymphocytosis 02/24/2017  . Essential hypertension 02/12/2017  . Mild intermittent asthma with acute exacerbation 12/12/2016  . Left shoulder pain 06/02/2015  . Insomnia 05/12/2014  . Fibromyalgia 05/12/2014  . Allergic rhinitis 03/28/2014  . Osteoarthritis 12/20/2013  . Cervical strain 06/21/2013  . Low back pain 06/21/2013  . Metatarsalgia of both feet 03/11/2013  . Sciatica 01/25/2013  . Restless leg syndrome 11/24/2012  . Pure hypercholesterolemia 10/20/2012  . OSA on CPAP 10/20/2012  . Depression 10/20/2012  . Iron deficiency anemia, unspecified 10/01/2012  . Diabetes mellitus, type 2 ([place]) 12/31/2010  . ANAL FISSURE 12/31/2010  . Morbid obesity ([place]) 01/08/2007   . CONSTIPATION 01/08/2007   Past Medical History:  Diagnosis Date  . Allergic rhinitis, cause unspecified   . Anemia   . Arthritis   . Asthma   . Back pain   . Chest pain   . Constipation   . CTS (carpal tunnel syndrome)   . Cystocele   . Depression   . Diabetes mellitus   . Dysfunction of eustachian tube   . Fatty liver   . Fissure, anal   . Gallbladder problem   . Genital herpes   . GERD (gastroesophageal reflux disease)   . Hemorrhoid   . Hx of migraine headaches   . Hyperlipidemia   . Hypertension   . IBS (irritable bowel syndrome)   . Insomnia   . Iron deficiency anemia, unspecified   . Joint pain   . Nausea   . Obesity   . OSA on CPAP   . Osteoarthritis   . Rectocele   . Sleep apnea   . TMJ (dislocation of temporomandibular joint)    Family History  Problem Relation Age of Onset  . Hypertension Mother 69  . Osteoarthritis Mother   . Diabetes Mother   . Colon cancer Mother 74  . Cancer Mother 41       colon cancer  . Hyperlipidemia Mother   . Obesity Mother   . Sudden death Mother   . Arthritis Mother   . Colon cancer Paternal Grandmother   . Cancer Paternal Grandmother        colon cancer  . Stroke Maternal Grandmother   . Colon cancer Maternal Grandmother   . Colon polyps Brother  x 2 brother  . Diabetes Daughter   . Hypertension Daughter   . Sleep apnea Daughter   . Mental illness Daughter        depression  . Migraines Daughter   . Cirrhosis Brother        alcoholism  . Alcohol abuse Brother   . Migraines Daughter   . Mental illness Daughter        anxiety attacks  . Protein S deficiency Daughter   . Sleep apnea Brother   . Hyperlipidemia Brother   . Migraines Brother   . Obstructive Sleep Apnea Brother   . Colon cancer Paternal Aunt    Past Surgical History:  Procedure Laterality Date  . ABDOMINAL HYSTERECTOMY  11/11/2010   [place].  Ovaries intact.  Uterine fibroids with DUB.  [name] CARPAL TUNNEL RELEASE  1989   Left  .  CHOLECYSTECTOMY    . CHOLECYSTECTOMY    . COLONOSCOPY  06/29/2012   normal.  Repeat in 5 years.  [name] [place]  . ESOPHAGOGASTRODUODENOSCOPY  12/13/2011   dysphagia.  Henrene Pastor.  Normal.  . Sleep study  09/11/2012   severe OSA.  CPAP titration at 12 cm water pressure.  . TUBAL LIGATION  1988   Social History   Occupational History  . [employer]   Social History Main Topics  . Smoking status: Former Research scientist (life sciences)  . Smokeless tobacco: Never Used     Comment: smoked occasionally longest 6 mos.  . Alcohol use No  . Drug use: No  . Sexual activity: No

## 2017-06-19 ENCOUNTER — Ambulatory Visit (INDEPENDENT_AMBULATORY_CARE_PROVIDER_SITE_OTHER): Payer: 59 | Admitting: Physician Assistant

## 2017-06-19 VITALS — BP 110/74 | HR 86 | Temp 98.2°F | Ht 61.0 in | Wt 223.0 lb

## 2017-06-19 DIAGNOSIS — Z6841 Body Mass Index (BMI) 40.0 and over, adult: Secondary | ICD-10-CM | POA: Diagnosis not present

## 2017-06-19 DIAGNOSIS — Z794 Long term (current) use of insulin: Secondary | ICD-10-CM | POA: Diagnosis not present

## 2017-06-19 DIAGNOSIS — E669 Obesity, unspecified: Secondary | ICD-10-CM | POA: Diagnosis not present

## 2017-06-19 DIAGNOSIS — E119 Type 2 diabetes mellitus without complications: Secondary | ICD-10-CM | POA: Diagnosis not present

## 2017-06-19 DIAGNOSIS — IMO0001 Reserved for inherently not codable concepts without codable children: Secondary | ICD-10-CM

## 2017-06-23 NOTE — Progress Notes (Signed)
Office: [phone]  /  Fax: [phone]   HPI:   Chief Complaint: OBESITY Cristina [name] is here to discuss her progress with her obesity treatment plan. She is on the Category 3 plan and is following her eating plan approximately 75 % of the time. She states she is exercising 0 minutes 0 times per week. Cristina [name] continues to do well with weight loss. She started school and has had a hard time planning ahead. Her weight is 223 lb (101.2 kg) today and has maintained weight over a period of 2 weeks since her last visit. She has lost 1 lb since starting treatment with [name].  Diabetes II Cristina [name] has a diagnosis of diabetes type II. Cristina [name] states fasting BGs range between 90's and 130's and denies any hypoglycemic episodes. She has been working on intensive lifestyle modifications including diet, exercise, and weight loss to help control her blood glucose levels.  ALLERGIES: Allergies  Allergen Reactions   Fish Oil Rash    MEDICATIONS: Current Outpatient Prescriptions on File Prior to Visit  Medication Sig Dispense Refill   acyclovir (ZOVIRAX) 400 MG tablet TAKE 1 TABLET BY MOUTH 2 TIMES DAILY. 180 tablet 3   albuterol (PROAIR HFA) 108 (90 Base) MCG/ACT inhaler Inhale 2 puffs into the lungs every 6 (six) hours as needed. 18 g 1   AMBULATORY NON FORMULARY MEDICATION Reported on 10/26/2015     ANUCORT-HC 25 MG suppository PLACE 1 SUPPOSITORY RECTALLY 2 TIMES DAILY AS NEEDED FOR HEMORRHOIDS. 25 suppository 5   aspirin EC 81 MG tablet Take 81 mg by mouth daily.     atorvastatin (LIPITOR) 20 MG tablet Take 1 tablet (20 mg total) by mouth daily. 90 tablet 3   azelastine (ASTELIN) 0.1 % nasal spray PLACE 2 SPRAYS INTO BOTH NOSTRILS 2 TIMES DAILY AS DIRECTED 90 mL 3   azelastine (OPTIVAR) 0.05 % ophthalmic solution Place 1 drop into both eyes daily. PRN  3   BIOTIN 5000 PO Take by mouth.     buPROPion (WELLBUTRIN SR) 200 MG 12 hr tablet Take 1 tablet (200 mg total) by mouth daily. 30 tablet 0    Calcium Carbonate-Vitamin D (CALCIUM + D PO) Take 1 tablet by mouth daily.     canagliflozin (INVOKANA) 300 MG TABS tablet Take 1 tablet (300 mg total) by mouth daily before breakfast. 90 tablet 3   cetirizine (ZYRTEC) 10 MG tablet Take 10 mg by mouth daily.     diltiazem 2 % GEL Apply 1 application topically 3 (three) times daily. 1 Package 3   docusate sodium (COLACE) 100 MG capsule Take 100 mg by mouth as needed. Reported on 05/04/2016     ferrous sulfate 325 (65 FE) MG EC tablet Take 325 mg by mouth daily.      fluticasone (FLONASE) 50 MCG/ACT nasal spray PLACE 2 SPRAYS INTO BOTH NOSTRILS DAILY. 48 g 3   gabapentin (NEURONTIN) 300 MG capsule Take 1 capsule (300 mg total) by mouth 3 (three) times daily. 270 capsule 1   glipiZIDE (GLIPIZIDE XL) 10 MG 24 hr tablet TAKE 1 TABLET BY MOUTH DAILY WITH BREAKFAST. 90 tablet 3   Glucosamine HCl 1000 MG TABS Take 1,000 mg by mouth 2 (two) times daily with a meal.      glucose blood test strip accucheck guide strips and fast clix lancets Use as instructed dm 2 100 each 1   hydrocortisone 2.5 % ointment Apply topically 2 (two) times daily. 30 g 0   Insulin Pen Needle (PEN NEEDLES)  31G X 5 MM MISC 1 each by Does not apply route daily. USE TO INJECT VICTOZA DAILY AS DIRECTED 100 each 3   liraglutide 18 MG/3ML SOPN Inject 0.3 mLs (1.8 mg total) into the skin daily. 6 mL 11   lisinopril (PRINIVIL,ZESTRIL) 5 MG tablet TAKE 1 TABLET (5 MG TOTAL) BY MOUTH DAILY. 30 tablet 0   metFORMIN (GLUCOPHAGE) 1000 MG tablet TAKE 1 TABLET BY MOUTH 2 TIMES DAILY WITH A MEAL. 180 tablet 3   montelukast (SINGULAIR) 10 MG tablet Take 1 tablet (10 mg total) by mouth at bedtime. 30 tablet 0   Multiple Vitamin (MULTIVITAMIN) capsule Take 1 capsule by mouth daily.     nitroGLYCERIN (NITROSTAT) 0.4 MG SL tablet Place 1 tablet (0.4 mg total) under the tongue every 5 (five) minutes as needed for chest pain. 20 tablet 0   pantoprazole (PROTONIX) 40 MG tablet TAKE 1  TABLET BY MOUTH 2 TIMES DAILY. 180 tablet 3   rOPINIRole (REQUIP) 1 MG tablet Take 1 tablet (1 mg total) by mouth at bedtime. 90 tablet 3   sertraline (ZOLOFT) 100 MG tablet Take 1.5 tablets (150 mg total) by mouth daily. 135 tablet 3   TRUEPLUS LANCETS 30G MISC USE TO CHECK BLOOD GLUCOSE DAILY AS DIRECTED 100 each 3   Vitamin D, Ergocalciferol, (DRISDOL) 50000 units CAPS capsule Take 1 capsule (50,000 Units total) by mouth every 7 (seven) days. 4 capsule 0   Current Facility-Administered Medications on File Prior to Visit  Medication Dose Route Frequency Provider Last Rate Last Dose   0.9 %  sodium chloride infusion  500 mL Intravenous Continuous Irene Shipper, MD       technetium tetrofosmin (TC-MYOVIEW) injection 37.1 millicurie  69.6 millicurie Intravenous Once PRN Larey Dresser, MD        PAST MEDICAL HISTORY: Past Medical History:  Diagnosis Date   Allergic rhinitis, cause unspecified    Anemia    Arthritis    Asthma    Back pain    Chest pain    Constipation    CTS (carpal tunnel syndrome)    Cystocele    Depression    Diabetes mellitus    Dysfunction of eustachian tube    Fatty liver    Fissure, anal    Gallbladder problem    Genital herpes    GERD (gastroesophageal reflux disease)    Hemorrhoid    Hx of migraine headaches    Hyperlipidemia    Hypertension    IBS (irritable bowel syndrome)    Insomnia    Iron deficiency anemia, unspecified    Joint pain    Nausea    Obesity    OSA on CPAP    Osteoarthritis    Rectocele    Sleep apnea    TMJ (dislocation of temporomandibular joint)     PAST SURGICAL HISTORY: Past Surgical History:  Procedure Laterality Date   ABDOMINAL HYSTERECTOMY  11/11/2010   [place] Plan.  Ovaries intact.  Uterine fibroids with DUB.   CARPAL TUNNEL RELEASE  1989   Left   CHOLECYSTECTOMY     CHOLECYSTECTOMY     COLONOSCOPY  06/29/2012   normal.  Repeat in 5 years.   ECTOPIC PREGNANCY  SURGERY  1993   ESOPHAGOGASTRODUODENOSCOPY  12/13/2011   dysphagia.  Henrene Pastor.  Normal.   Sleep study  09/11/2012   severe OSA.  CPAP titration at 12 cm water pressure.   TUBAL LIGATION  1988    SOCIAL HISTORY: Social History  Substance  Use Topics   Smoking status: Former Smoker   Smokeless tobacco: Never Used     Comment: smoked occasionally longest 6 mos.   Alcohol use No    FAMILY HISTORY: Family History  Problem Relation Age of Onset   Hypertension Mother 27   Osteoarthritis Mother    Diabetes Mother    Colon cancer Mother 29   Cancer Mother 50       colon cancer   Hyperlipidemia Mother    Obesity Mother    Sudden death Mother    Arthritis Mother    Colon cancer Paternal Grandmother    Cancer Paternal Grandmother        colon cancer   Stroke Maternal Grandmother    Colon cancer Maternal Grandmother    Colon polyps Brother        x 2 brother   Diabetes Daughter    Hypertension Daughter    Sleep apnea Daughter    Mental illness Daughter        depression   Migraines Daughter    Cirrhosis Brother        alcoholism   Alcohol abuse Brother    Migraines Daughter    Mental illness Daughter        anxiety attacks   Protein S deficiency Daughter    Sleep apnea Brother    Hyperlipidemia Brother    Migraines Brother    Obstructive Sleep Apnea Brother    Colon cancer Paternal Aunt     ROS: Review of Systems  Constitutional: Negative for weight loss.  Endo/Heme/Allergies:       Negative hypoglycemia    PHYSICAL EXAM: Blood pressure 110/74, pulse 86, temperature 98.2 F (36.8 C), temperature source Oral, height 5' 1"  (1.549 m), weight 223 lb (101.2 kg), SpO2 97 %. Body mass index is 42.14 kg/m. Physical Exam  Constitutional: She is oriented to person, place, and time. She appears well-developed and well-nourished.  Cardiovascular: Normal rate.   Pulmonary/Chest: Effort normal.  Musculoskeletal: Normal range of motion.    Neurological: She is oriented to person, place, and time.  Skin: Skin is warm and dry.  Psychiatric: She has a normal mood and affect. Her behavior is normal.  Vitals reviewed.   RECENT LABS AND TESTS: BMET    Component Value Date/Time   NA 137 04/23/2017 1002   K 4.4 04/23/2017 1002   CL 98 04/23/2017 1002   CO2 22 04/23/2017 1002   GLUCOSE 109 (H) 04/23/2017 1002   GLUCOSE 123 (H) 08/27/2016 1157   BUN 17 04/23/2017 1002   CREATININE 0.81 04/23/2017 1002   CREATININE 0.81 08/27/2016 1157   CALCIUM 9.4 04/23/2017 1002   GFRNONAA 83 04/23/2017 1002   GFRNONAA 88 01/11/2015 0936   GFRAA 95 04/23/2017 1002   GFRAA >89 01/11/2015 0936   Lab Results  Component Value Date   HGBA1C 7.3 (H) 04/23/2017   HGBA1C 8.2 02/12/2017   HGBA1C 7.7 12/07/2016   HGBA1C 7.1 (H) 08/27/2016   HGBA1C 8.4 (H) 05/04/2016   Lab Results  Component Value Date   INSULIN 31.3 (H) 04/23/2017   CBC    Component Value Date/Time   WBC 11.6 (H) 04/23/2017 1002   WBC 7.7 12/12/2016 1145   WBC 14.1 (H) 11/07/2016 1018   RBC 5.00 04/23/2017 1002   RBC 4.86 12/12/2016 1145   RBC 5.06 11/07/2016 1018   HGB 14.0 04/23/2017 1002   HCT 41.7 04/23/2017 1002   PLT 332 02/12/2017 1052   MCV 83 04/23/2017  1002   MCH 28.0 04/23/2017 1002   MCH 28.3 12/12/2016 1145   MCH 27.5 11/07/2016 1018   MCHC 33.6 04/23/2017 1002   MCHC 33.9 12/12/2016 1145   MCHC 32.3 11/07/2016 1018   RDW 16.0 (H) 04/23/2017 1002   LYMPHSABS 4.6 (H) 04/23/2017 1002   MONOABS 846 11/07/2016 1018   EOSABS 0.2 04/23/2017 1002   BASOSABS 0.0 04/23/2017 1002   Iron/TIBC/Ferritin/ %Sat    Component Value Date/Time   IRON 30 (L) 01/25/2013 1050   TIBC 375 11/23/2012 1345   FERRITIN 10 11/23/2012 1345   IRONPCTSAT 12 (L) 11/23/2012 1345   Lipid Panel     Component Value Date/Time   CHOL 226 (H) 04/23/2017 1002   TRIG 196 (H) 04/23/2017 1002   HDL 46 04/23/2017 1002   CHOLHDL 4.2 02/12/2017 1052   CHOLHDL 3.7 08/27/2016  1157   VLDL 23 08/27/2016 1157   LDLCALC 141 (H) 04/23/2017 1002   Hepatic Function Panel     Component Value Date/Time   PROT 7.6 04/23/2017 1002   ALBUMIN 4.5 04/23/2017 1002   AST 62 (H) 04/23/2017 1002   ALT 72 (H) 04/23/2017 1002   ALKPHOS 83 04/23/2017 1002   BILITOT 0.3 04/23/2017 1002      Component Value Date/Time   TSH 1.420 04/23/2017 1002   TSH 1.33 11/07/2016 1018   TSH 1.78 05/04/2016 0958    ASSESSMENT AND PLAN: Type 2 diabetes mellitus without complication, with long-term current use of insulin (HCC)  Class 3 obesity with serious comorbidity and body mass index (BMI) of 40.0 to 44.9 in adult, unspecified obesity type (Cristina [name])  PLAN:  Diabetes II Cristina [name] has been given extensive diabetes education by myself today including ideal fasting and post-prandial blood glucose readings, individual ideal Hgb A1c goals  and hypoglycemia prevention. We discussed the importance of good blood sugar control to decrease the likelihood of diabetic complications such as nephropathy, neuropathy, limb loss, blindness, coronary artery disease, and death. We discussed the importance of intensive lifestyle modification including diet, exercise and weight loss as the first line treatment for diabetes. Cristina [name] agrees to continue her diabetes medications and will follow up at the agreed upon time.  We spent > than 50% of the 15 minute visit on the counseling as documented in the note.  Obesity Cristina [name] is currently in the action stage of change. As such, her goal is to continue with weight loss efforts She has agreed to follow the Category 3 plan Cristina [name] has been instructed to work up to a goal of 150 minutes of combined cardio and strengthening exercise per week for weight loss and overall health benefits. We discussed the following Behavioral Modification Strategies today: increasing lean protein intake and emotional eating strategies  Cristina [name] has agreed to follow up with our clinic in 3 weeks. She was  informed of the importance of frequent follow up visits to maximize her success with intensive lifestyle modifications for her multiple health conditions.  I, Doreene Nest, am acting as transcriptionist for Lacy Duverney, PA-C  I have reviewed the above documentation for accuracy and completeness, and I agree with the above. -Lacy Duverney, PA-C  I have reviewed the above note and agree with the plan. -Dennard Nip, MD   OBESITY BEHAVIORAL INTERVENTION VISIT  Today's visit was # 5 out of 22.  Starting weight: 224 lbs Starting date: 04/23/17 Today's weight : 223 lbs Today's date: 06/19/2017 Total lbs lost to date: 1 (Patients must lose 7 lbs in the first  6 months to continue with counseling)   ASK: We discussed the diagnosis of obesity with Cristina [name] today and Cristina [name] agreed to give [name] permission to discuss obesity behavioral modification therapy today.  ASSESS: Cristina [name] has the diagnosis of obesity and her BMI today is 42.2 Cristina [name] is in the action stage of change   ADVISE: Cristina [name] was educated on the multiple health risks of obesity as well as the benefit of weight loss to improve her health. She was advised of the need for long term treatment and the importance of lifestyle modifications.  AGREE: Multiple dietary modification options and treatment options were discussed and  Carlye agreed to follow the Category 3 plan We discussed the following Behavioral Modification Strategies today: increasing lean protein intake and emotional eating strategies

## 2017-07-02 ENCOUNTER — Encounter (INDEPENDENT_AMBULATORY_CARE_PROVIDER_SITE_OTHER): Payer: Self-pay | Admitting: Specialist

## 2017-07-02 ENCOUNTER — Ambulatory Visit (INDEPENDENT_AMBULATORY_CARE_PROVIDER_SITE_OTHER): Payer: 59 | Admitting: Specialist

## 2017-07-02 VITALS — BP 112/71 | HR 80

## 2017-07-02 DIAGNOSIS — M48062 Spinal stenosis, lumbar region with neurogenic claudication: Secondary | ICD-10-CM

## 2017-07-02 DIAGNOSIS — G5601 Carpal tunnel syndrome, right upper limb: Secondary | ICD-10-CM

## 2017-07-02 DIAGNOSIS — M4316 Spondylolisthesis, lumbar region: Secondary | ICD-10-CM | POA: Diagnosis not present

## 2017-07-02 NOTE — Patient Instructions (Signed)
Avoid bending, stooping and avoid lifting weights greater than 10 lbs. Avoid prolong standing and walking. Avoid frequent bending and stooping  No lifting greater than 10 lbs. May use ice or moist heat for pain. Weight loss is of benefit. Handicap license is approved.  

## 2017-07-02 NOTE — Progress Notes (Addendum)
Office Visit Note   Patient: Cristina [name]           Date of Birth: 1961/01/23           MRN: [id number] Visit Date: 07/02/2017              Requested by: [name], MD [address] [place], [place] [address] PCP: [name], MD   Assessment & Plan: Visit Diagnoses:  1. Spondylolisthesis, lumbar region   2. Spinal stenosis of lumbar region with neurogenic claudication   3. Carpal tunnel syndrome, right upper limb   56 year old female with right carpal tunnel symptoms that are intermittant and improved with use of night splint. She also has grade 1 spondylolisthesis at L4-5 and spinal stenosis findings with neurogenic claudication. Results of her EMGs and NCV show severe right carpal tunnel Syndrome with moderate to severe conduction changes right wrist. I explained to Cristina [name] that if her symptoms are improved that she can be followed for the CTS but if she begins to have more constant numbness she needs to go ahead with carpal tunnel release to prevent permanent nerve changes due to prolong nerve compression. She is currently in a weight reduction program, a program that can improve her overall health and decrease risks of infection and loss of fixation associated with surgical fusion surgery if she decides for this treatment in the future. Presently She continues with neurogenic claudication symptoms but is employed and working normal hours. I am not optimistic about her chances of returning to a higher level of function with operative treatment of the lumbar condition with decompression with fusion and recommend that she continue with weight loss and conservative treatment including considering epidural steroids if she is not seeing improvement with changing  Posture to sitting or stooping. The risks of surgery versus the benefits of surgery are not quite as obvious considering she is able to maintain gainful employment on minimal medications.    Plan:Avoid bending, stooping  and avoid lifting weights greater than 10 lbs. Avoid prolong standing and walking. Avoid frequent bending and stooping  No lifting greater than 10 lbs. May use ice or moist heat for pain. Weight loss is of benefit. Handicap license is approved.   Follow-Up Instructions: Return in about 6 months (around 01/02/2018).   Orders:  No orders of the defined types were placed in this encounter.  No orders of the defined types were placed in this encounter.     Procedures: No procedures performed   Clinical Data: No additional findings.   Subjective: Chief Complaint  Patient presents with  . Right Hand - Pain, Follow-up  . Lower Back - Pain, Follow-up    56 year old female works for [employer] on [employer]. She has history of neurogenic claudication and has a spondylolisthesis at the L4-5 level that is grade 1-2. She still has pain and  Difficulty at the end of the day with prolong standing and walking. 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 She is taking gabapentin BID 35m, requip and zoloft.     Review of Systems  Constitutional: Positive for activity change and unexpected weight change.  HENT: Positive for congestion.   Eyes: Negative for redness and itching.  Respiratory: Negative for chest tightness, shortness of breath and wheezing.   Cardiovascular: Negative for chest pain and leg swelling.  Gastrointestinal: Negative for abdominal distention, abdominal pain, nausea and vomiting.  Genitourinary: Negative  for flank pain and frequency.  Musculoskeletal: Positive for back pain and gait problem.  Skin: Positive for rash. Negative for color change, pallor and wound.  Psychiatric/Behavioral: Negative for agitation, confusion and suicidal ideas. The patient is not nervous/anxious.      Objective: Vital Signs: BP 112/71   Pulse 80   Physical Exam  Constitutional: She appears well-developed and well-nourished.  Eyes:  Pupils are equal, round, and reactive to light. EOM are normal. Right eye exhibits no discharge. Left eye exhibits no discharge.  Neck: No JVD present. No tracheal deviation present. No thyromegaly present.  Pulmonary/Chest: Effort normal and breath sounds normal.  Abdominal: Soft. Bowel sounds are normal. She exhibits no distension and no mass. There is no tenderness. There is no rebound and no guarding. No hernia.  Musculoskeletal: She exhibits no edema or deformity.  Lymphadenopathy:    She has no cervical adenopathy.  Skin: No rash noted. No erythema. No pallor.  Psychiatric: She has a normal mood and affect. Her behavior is normal. Judgment and thought content normal.    Back Exam   Tenderness  The patient is experiencing tenderness in the lumbar.  Range of Motion  Extension: abnormal  Flexion: abnormal  Lateral Bend Right: abnormal  Lateral Bend Left: abnormal  Rotation Right: abnormal  Rotation Left: abnormal   Muscle Strength  The patient has normal back strength. Right Quadriceps:  5/5  Left Quadriceps:  5/5  Right Hamstrings:  5/5  Left Hamstrings:  5/5   Tests  Straight leg raise right: positive Straight leg raise left: positive  Reflexes  Patellar: normal Achilles: normal Babinski's sign: normal   Other  Toe Walk: normal Heel Walk: normal Sensation: decreased Gait: normal  Erythema: no back redness Scars: absent      Specialty Comments:  No specialty comments available.  Imaging: No results found.   PMFS History: Patient Active Problem List   Diagnosis Date Noted  . Type 2 diabetes mellitus without complication, without long-term current use of insulin ([place]) 05/22/2017  . Class 3 obesity with serious comorbidity and body mass index (BMI) of 40.0 to 44.9 in adult ([place]) 05/21/2017  . Family history of colon cancer 04/01/2017  . LUQ abdominal pain 04/01/2017  . Irritable bowel syndrome with diarrhea 04/01/2017  . NSAID long-term use  04/01/2017  . Nausea without vomiting 04/01/2017  . Lymphocytosis 02/24/2017  . Essential hypertension 02/12/2017  . Mild intermittent asthma with acute exacerbation 12/12/2016  . Left shoulder pain 06/02/2015  . Insomnia 05/12/2014  . Fibromyalgia 05/12/2014  . Allergic rhinitis 03/28/2014  . Osteoarthritis 12/20/2013  . Cervical strain 06/21/2013  . Low back pain 06/21/2013  . Metatarsalgia of both feet 03/11/2013  . Sciatica 01/25/2013  . Restless leg syndrome 11/24/2012  . Pure hypercholesterolemia 10/20/2012  . OSA on CPAP 10/20/2012  . Depression 10/20/2012  . Iron deficiency anemia, unspecified 10/01/2012  . Diabetes mellitus, type 2 ([place]) 12/31/2010  . ANAL FISSURE 12/31/2010  . Morbid obesity ([place]) 01/08/2007  . CONSTIPATION 01/08/2007   Past Medical History:  Diagnosis Date  . Allergic rhinitis, cause unspecified   . Anemia   . Arthritis   . Asthma   . Back pain   . Chest pain   . Constipation   . CTS (carpal tunnel syndrome)   . Cystocele   . Depression   . Diabetes mellitus   . Dysfunction of eustachian tube   . Fatty liver   . Fissure, anal   . Gallbladder problem   .  Genital herpes   . GERD (gastroesophageal reflux disease)   . Hemorrhoid   . Hx of migraine headaches   . Hyperlipidemia   . Hypertension   . IBS (irritable bowel syndrome)   . Insomnia   . Iron deficiency anemia, unspecified   . Joint pain   . Nausea   . Obesity   . OSA on CPAP   . Osteoarthritis   . Rectocele   . Sleep apnea   . TMJ (dislocation of temporomandibular joint)     Family History  Problem Relation Age of Onset  . Hypertension Mother 56  . Osteoarthritis Mother   . Diabetes Mother   . Colon cancer Mother 62  . Cancer Mother 74       colon cancer  . Hyperlipidemia Mother   . Obesity Mother   . Sudden death Mother   . Arthritis Mother   . Colon cancer Paternal Grandmother   . Cancer Paternal Grandmother        colon cancer  . Stroke Maternal Grandmother     . Colon cancer Maternal Grandmother   . Colon polyps Brother        x 2 brother  . Diabetes Daughter   . Hypertension Daughter   . Sleep apnea Daughter   . Mental illness Daughter        depression  . Migraines Daughter   . Cirrhosis Brother        alcoholism  . Alcohol abuse Brother   . Migraines Daughter   . Mental illness Daughter        anxiety attacks  . Protein S deficiency Daughter   . Sleep apnea Brother   . Hyperlipidemia Brother   . Migraines Brother   . Obstructive Sleep Apnea Brother   . Colon cancer Paternal Aunt     Past Surgical History:  Procedure Laterality Date  . ABDOMINAL HYSTERECTOMY  11/11/2010   [facility] Plan.  Ovaries intact.  Uterine fibroids with DUB.  [name] CARPAL TUNNEL RELEASE  1989   Left  . CHOLECYSTECTOMY    . CHOLECYSTECTOMY    . COLONOSCOPY  06/29/2012   normal.  Repeat in 5 years.  [name] [place]  . ESOPHAGOGASTRODUODENOSCOPY  12/13/2011   dysphagia.  Henrene Pastor.  Normal.  . Sleep study  09/11/2012   severe OSA.  CPAP titration at 12 cm water pressure.  . TUBAL LIGATION  1988   Social History   Occupational History  . [employer]   Social History Main Topics  . Smoking status: Former Research scientist (life sciences)  . Smokeless tobacco: Never Used     Comment: smoked occasionally longest 6 mos.  . Alcohol use No  . Drug use: No  . Sexual activity: No

## 2017-07-04 MED FILL — glipiZIDE ER 10 MG TB24: 10 | 90 days supply | Qty: 90 | Fill #0

## 2017-07-09 ENCOUNTER — Ambulatory Visit (INDEPENDENT_AMBULATORY_CARE_PROVIDER_SITE_OTHER): Payer: 59 | Admitting: Physician Assistant

## 2017-07-09 VITALS — BP 119/81 | HR 84 | Temp 98.2°F | Ht 61.0 in | Wt 223.0 lb

## 2017-07-09 DIAGNOSIS — Z794 Long term (current) use of insulin: Secondary | ICD-10-CM | POA: Diagnosis not present

## 2017-07-09 DIAGNOSIS — E119 Type 2 diabetes mellitus without complications: Secondary | ICD-10-CM

## 2017-07-09 DIAGNOSIS — E669 Obesity, unspecified: Secondary | ICD-10-CM | POA: Diagnosis not present

## 2017-07-09 DIAGNOSIS — F3289 Other specified depressive episodes: Secondary | ICD-10-CM

## 2017-07-09 DIAGNOSIS — E559 Vitamin D deficiency, unspecified: Secondary | ICD-10-CM | POA: Diagnosis not present

## 2017-07-09 DIAGNOSIS — Z6841 Body Mass Index (BMI) 40.0 and over, adult: Secondary | ICD-10-CM | POA: Diagnosis not present

## 2017-07-09 DIAGNOSIS — IMO0001 Reserved for inherently not codable concepts without codable children: Secondary | ICD-10-CM

## 2017-07-10 ENCOUNTER — Other Ambulatory Visit: Payer: Self-pay | Admitting: Family Medicine

## 2017-07-10 ENCOUNTER — Other Ambulatory Visit (INDEPENDENT_AMBULATORY_CARE_PROVIDER_SITE_OTHER): Payer: Self-pay | Admitting: Family Medicine

## 2017-07-10 DIAGNOSIS — F3289 Other specified depressive episodes: Secondary | ICD-10-CM

## 2017-07-10 DIAGNOSIS — E559 Vitamin D deficiency, unspecified: Secondary | ICD-10-CM

## 2017-07-10 MED ORDER — VITAMIN D (ERGOCALCIFEROL) 1.25 MG (50000 UNIT) PO CAPS: 50000.0000 [IU] | ORAL_CAPSULE | ORAL | 0 refills | Status: DC

## 2017-07-10 MED ORDER — BUPROPION HCL ER (SR) 200 MG PO TB12: 200.0000 mg | ORAL_TABLET | Freq: Every day | ORAL | 0 refills | Status: DC

## 2017-07-10 MED FILL — VICTOZA 18 MG/3 ML INJECT P: 18 | 30 days supply | Qty: 9 | Fill #1

## 2017-07-10 MED FILL — ACYCLOVIR 400 MG TABLET: 400 | 90 days supply | Qty: 180 | Fill #0

## 2017-07-10 MED FILL — UNIFINE PENTIPS 31GX3/16: 31G X 5 MM | 90 days supply | Qty: 100 | Fill #0

## 2017-07-10 MED FILL — UNIFINE PENTIPS 31GX3/16": 31G X 5 MM | 90 days supply | Qty: 100 | Fill #0

## 2017-07-10 MED FILL — VIT D2 1.25 MG (50,000 UNIT: 1.25 MG | 28 days supply | Qty: 4 | Fill #0

## 2017-07-10 MED FILL — BUPROPION HCL SR 200 MG TAB: 200 | 30 days supply | Qty: 30 | Fill #0

## 2017-07-10 NOTE — Progress Notes (Signed)
Office: [phone]  /  Fax: [phone]   HPI:   Chief Complaint: OBESITY Cristina [name] is here to discuss her progress with her obesity treatment plan. She is on the Category 3 plan and is following her eating plan approximately 25 % of the time. She states she is exercising 0 minutes 0 times per week. Cristina [name] has been [employer] and is controlling her portions. She still struggles with following the plan.  Her weight is 223 lb (101.2 kg) today and she has maintained weight over a period of 3 weeks since her last visit. She has lost 1 lb since starting treatment with [name].  Diabetes II [name] has a diagnosis of diabetes type II. Cristina [name] states she is not checking BGs at home and denies any hypoglycemic episodes or polyphagia.  She has been working on intensive lifestyle modifications including diet, exercise, and weight loss to help control her blood glucose levels.   ALLERGIES: Allergies  Allergen Reactions  . Fish Oil Rash    MEDICATIONS: Current Outpatient Prescriptions on File Prior to Visit  Medication Sig Dispense Refill  . acyclovir (ZOVIRAX) 400 MG tablet TAKE 1 TABLET BY MOUTH 2 TIMES DAILY. 180 tablet 3  . albuterol (PROAIR HFA) 108 (90 Base) MCG/ACT inhaler Inhale 2 puffs into the lungs every 6 (six) hours as needed. 18 g 1  . AMBULATORY NON FORMULARY MEDICATION Reported on 10/26/2015    . ANUCORT-HC 25 MG suppository PLACE 1 SUPPOSITORY RECTALLY 2 TIMES DAILY AS NEEDED FOR HEMORRHOIDS. 25 suppository 5  . aspirin EC 81 MG tablet Take 81 mg by mouth daily.    [name] atorvastatin (LIPITOR) 20 MG tablet Take 1 tablet (20 mg total) by mouth daily. 90 tablet 3  . azelastine (ASTELIN) 0.1 % nasal spray PLACE 2 SPRAYS INTO BOTH NOSTRILS 2 TIMES DAILY AS DIRECTED 90 mL 3  . azelastine (OPTIVAR) 0.05 % ophthalmic solution Place 1 drop into both eyes daily. PRN  3  . BIOTIN 5000 PO Take by mouth.    [name] buPROPion (WELLBUTRIN SR) 200 MG 12 hr tablet Take 1 tablet (200 mg total) by  mouth daily. 30 tablet 0  . Calcium Carbonate-Vitamin D (CALCIUM + D PO) Take 1 tablet by mouth daily.    . canagliflozin (INVOKANA) 300 MG TABS tablet Take 1 tablet (300 mg total) by mouth daily before breakfast. 90 tablet 3  . cetirizine (ZYRTEC) 10 MG tablet Take 10 mg by mouth daily.    [name] diltiazem 2 % GEL Apply 1 application topically 3 (three) times daily. 1 Package 3  . docusate sodium (COLACE) 100 MG capsule Take 100 mg by mouth as needed. Reported on 05/04/2016    . ferrous sulfate 325 (65 FE) MG EC tablet Take 325 mg by mouth daily.     . fluticasone (FLONASE) 50 MCG/ACT nasal spray PLACE 2 SPRAYS INTO BOTH NOSTRILS DAILY. 48 g 3  . gabapentin (NEURONTIN) 300 MG capsule Take 1 capsule (300 mg total) by mouth 3 (three) times daily. 270 capsule 1  . glipiZIDE (GLIPIZIDE XL) 10 MG 24 hr tablet TAKE 1 TABLET BY MOUTH DAILY WITH BREAKFAST. 90 tablet 3  . Glucosamine HCl 1000 MG TABS Take 1,000 mg by mouth 2 (two) times daily with a meal.     . glucose blood test strip accucheck guide strips and fast clix lancets Use as instructed dm 2 100 each 1  . hydrocortisone 2.5 % ointment Apply topically 2 (two) times daily. 30 g 0  .  Insulin Pen Needle (PEN NEEDLES) 31G X 5 MM MISC 1 each by Does not apply route daily. USE TO INJECT VICTOZA DAILY AS DIRECTED 100 each 3  . liraglutide 18 MG/3ML SOPN Inject 0.3 mLs (1.8 mg total) into the skin daily. 6 mL 11  . metFORMIN (GLUCOPHAGE) 1000 MG tablet TAKE 1 TABLET BY MOUTH 2 TIMES DAILY WITH A MEAL. 180 tablet 3  . montelukast (SINGULAIR) 10 MG tablet Take 1 tablet (10 mg total) by mouth at bedtime. 30 tablet 0  . Multiple Vitamin (MULTIVITAMIN) capsule Take 1 capsule by mouth daily.    . nitroGLYCERIN (NITROSTAT) 0.4 MG SL tablet Place 1 tablet (0.4 mg total) under the tongue every 5 (five) minutes as needed for chest pain. 20 tablet 0  . pantoprazole (PROTONIX) 40 MG tablet TAKE 1 TABLET BY MOUTH 2 TIMES DAILY. 180 tablet 3  . rOPINIRole (REQUIP) 1 MG  tablet Take 1 tablet (1 mg total) by mouth at bedtime. 90 tablet 3  . sertraline (ZOLOFT) 100 MG tablet Take 1.5 tablets (150 mg total) by mouth daily. 135 tablet 3  . TRUEPLUS LANCETS 30G MISC USE TO CHECK BLOOD GLUCOSE DAILY AS DIRECTED 100 each 3  . Vitamin D, Ergocalciferol, (DRISDOL) 50000 units CAPS capsule Take 1 capsule (50,000 Units total) by mouth every 7 (seven) days. 4 capsule 0   Current Facility-Administered Medications on File Prior to Visit  Medication Dose Route Frequency Provider Last Rate Last Dose  . 0.9 %  sodium chloride infusion  500 mL Intravenous Continuous Irene Shipper, MD      . technetium tetrofosmin (TC-MYOVIEW) injection 29.2 millicurie  44.6 millicurie Intravenous Once PRN Larey Dresser, MD        PAST MEDICAL HISTORY: Past Medical History:  Diagnosis Date  . Allergic rhinitis, cause unspecified   . Anemia   . Arthritis   . Asthma   . Back pain   . Chest pain   . Constipation   . CTS (carpal tunnel syndrome)   . Cystocele   . Depression   . Diabetes mellitus   . Dysfunction of eustachian tube   . Fatty liver   . Fissure, anal   . Gallbladder problem   . Genital herpes   . GERD (gastroesophageal reflux disease)   . Hemorrhoid   . Hx of migraine headaches   . Hyperlipidemia   . Hypertension   . IBS (irritable bowel syndrome)   . Insomnia   . Iron deficiency anemia, unspecified   . Joint pain   . Nausea   . Obesity   . OSA on CPAP   . Osteoarthritis   . Rectocele   . Sleep apnea   . TMJ (dislocation of temporomandibular joint)     PAST SURGICAL HISTORY: Past Surgical History:  Procedure Laterality Date  . ABDOMINAL HYSTERECTOMY  11/11/2010   [place] Plan.  Ovaries intact.  Uterine fibroids with DUB.  [name] CARPAL TUNNEL RELEASE  1989   Left  . CHOLECYSTECTOMY    . CHOLECYSTECTOMY    . COLONOSCOPY  06/29/2012   normal.  Repeat in 5 years.  [name] [place]  . ESOPHAGOGASTRODUODENOSCOPY  12/13/2011   dysphagia.  Henrene Pastor.   Normal.  . Sleep study  09/11/2012   severe OSA.  CPAP titration at 12 cm water pressure.  . TUBAL LIGATION  1988    SOCIAL HISTORY: Social History  Substance Use Topics  . Smoking status: Former Research scientist (life sciences)  . Smokeless tobacco: Never Used  Comment: smoked occasionally longest 6 mos.  . Alcohol use No    FAMILY HISTORY: Family History  Problem Relation Age of Onset  . Hypertension Mother 109  . Osteoarthritis Mother   . Diabetes Mother   . Colon cancer Mother 57  . Cancer Mother 51       colon cancer  . Hyperlipidemia Mother   . Obesity Mother   . Sudden death Mother   . Arthritis Mother   . Colon cancer Paternal Grandmother   . Cancer Paternal Grandmother        colon cancer  . Stroke Maternal Grandmother   . Colon cancer Maternal Grandmother   . Colon polyps Brother        x 2 brother  . Diabetes Daughter   . Hypertension Daughter   . Sleep apnea Daughter   . Mental illness Daughter        depression  . Migraines Daughter   . Cirrhosis Brother        alcoholism  . Alcohol abuse Brother   . Migraines Daughter   . Mental illness Daughter        anxiety attacks  . Protein S deficiency Daughter   . Sleep apnea Brother   . Hyperlipidemia Brother   . Migraines Brother   . Obstructive Sleep Apnea Brother   . Colon cancer Paternal Aunt     ROS: Review of Systems  Constitutional: Negative for weight loss.  Endo/Heme/Allergies:       Negative polyphagia Negative hypoglycemia    PHYSICAL EXAM: Blood pressure 119/81, pulse 84, temperature 98.2 F (36.8 C), temperature source Oral, height 5' 1"  (1.549 m), weight 223 lb (101.2 kg), SpO2 99 %. Body mass index is 42.14 kg/m. Physical Exam  Constitutional: She is oriented to person, place, and time. She appears well-developed and well-nourished.  Cardiovascular: Normal rate.   Pulmonary/Chest: Effort normal.  Musculoskeletal: Normal range of motion.  Neurological: She is oriented to person, place, and time.    Skin: Skin is warm and dry.  Psychiatric: She has a normal mood and affect. Her behavior is normal.  Vitals reviewed.   RECENT LABS AND TESTS: BMET    Component Value Date/Time   NA 137 04/23/2017 1002   K 4.4 04/23/2017 1002   CL 98 04/23/2017 1002   CO2 22 04/23/2017 1002   GLUCOSE 109 (H) 04/23/2017 1002   GLUCOSE 123 (H) 08/27/2016 1157   BUN 17 04/23/2017 1002   CREATININE 0.81 04/23/2017 1002   CREATININE 0.81 08/27/2016 1157   CALCIUM 9.4 04/23/2017 1002   GFRNONAA 83 04/23/2017 1002   GFRNONAA 88 01/11/2015 0936   GFRAA 95 04/23/2017 1002   GFRAA >89 01/11/2015 0936   Lab Results  Component Value Date   HGBA1C 7.3 (H) 04/23/2017   HGBA1C 8.2 02/12/2017   HGBA1C 7.7 12/07/2016   HGBA1C 7.1 (H) 08/27/2016   HGBA1C 8.4 (H) 05/04/2016   Lab Results  Component Value Date   INSULIN 31.3 (H) 04/23/2017   CBC    Component Value Date/Time   WBC 11.6 (H) 04/23/2017 1002   WBC 7.7 12/12/2016 1145   WBC 14.1 (H) 11/07/2016 1018   RBC 5.00 04/23/2017 1002   RBC 4.86 12/12/2016 1145   RBC 5.06 11/07/2016 1018   HGB 14.0 04/23/2017 1002   HCT 41.7 04/23/2017 1002   PLT 332 02/12/2017 1052   MCV 83 04/23/2017 1002   MCH 28.0 04/23/2017 1002   MCH 28.3 12/12/2016 1145   MCH  27.5 11/07/2016 1018   MCHC 33.6 04/23/2017 1002   MCHC 33.9 12/12/2016 1145   MCHC 32.3 11/07/2016 1018   RDW 16.0 (H) 04/23/2017 1002   LYMPHSABS 4.6 (H) 04/23/2017 1002   MONOABS 846 11/07/2016 1018   EOSABS 0.2 04/23/2017 1002   BASOSABS 0.0 04/23/2017 1002   Iron/TIBC/Ferritin/ %Sat    Component Value Date/Time   IRON 30 (L) 01/25/2013 1050   TIBC 375 11/23/2012 1345   FERRITIN 10 11/23/2012 1345   IRONPCTSAT 12 (L) 11/23/2012 1345   Lipid Panel     Component Value Date/Time   CHOL 226 (H) 04/23/2017 1002   TRIG 196 (H) 04/23/2017 1002   HDL 46 04/23/2017 1002   CHOLHDL 4.2 02/12/2017 1052   CHOLHDL 3.7 08/27/2016 1157   VLDL 23 08/27/2016 1157   LDLCALC 141 (H)  04/23/2017 1002   Hepatic Function Panel     Component Value Date/Time   PROT 7.6 04/23/2017 1002   ALBUMIN 4.5 04/23/2017 1002   AST 62 (H) 04/23/2017 1002   ALT 72 (H) 04/23/2017 1002   ALKPHOS 83 04/23/2017 1002   BILITOT 0.3 04/23/2017 1002      Component Value Date/Time   TSH 1.420 04/23/2017 1002   TSH 1.33 11/07/2016 1018   TSH 1.78 05/04/2016 0958    ASSESSMENT AND PLAN: Type 2 diabetes mellitus without complication, with long-term current use of insulin (HCC)  Class 3 obesity with serious comorbidity and body mass index (BMI) of 40.0 to 44.9 in adult, unspecified obesity type ([place])  PLAN:  Diabetes II Annasofia has been given extensive diabetes education by myself today including ideal fasting and post-prandial blood glucose readings, individual ideal Hgb A1c goals  and hypoglycemia prevention. We discussed the importance of good blood sugar control to decrease the likelihood of diabetic complications such as nephropathy, neuropathy, limb loss, blindness, coronary artery disease, and death. We discussed the importance of intensive lifestyle modification including diet, exercise and weight loss as the first line treatment for diabetes. Acelyn agrees to continue victoza at 1.8 mg qd and will follow up at the agreed upon time.  We spent > than 50% of the 15 minute visit on the counseling as documented in the note.  Obesity Tijah is currently in the action stage of change. As such, her goal is to continue with weight loss efforts She has agreed to keep a food journal with 1500 calories and 100+ grams of protein at supper daily and follow the Category 3 plan Jaline has been instructed to work up to a goal of 150 minutes of combined cardio and strengthening exercise per week for weight loss and overall health benefits. We discussed the following Behavioral Modification Strategies today: increasing lean protein intake and meal planning & cooking strategies  Alexanderia has agreed to follow up  with our clinic in 2 weeks. She was informed of the importance of frequent follow up visits to maximize her success with intensive lifestyle modifications for her multiple health conditions.  I, Doreene Nest, am acting as transcriptionist for Lacy Duverney, PA-C  I have reviewed the above documentation for accuracy and completeness, and I agree with the above. -Lacy Duverney, PA-C  I have reviewed the above note and agree with the plan. -Dennard Nip, MD   OBESITY BEHAVIORAL INTERVENTION VISIT  Today's visit was # 6 out of 22.  Starting weight: 224 lbs Starting date: 04/23/17 Today's weight : 223 lbs Today's date: 07/09/2017 Total lbs lost to date: 1 (Patients must lose 7  lbs in the first 6 months to continue with counseling)   ASK: We discussed the diagnosis of obesity with [name] today and Shirlene agreed to give [name] permission to discuss obesity behavioral modification therapy today.  ASSESS: Chizara has the diagnosis of obesity and her BMI today is 42.16 Tiphany is in the action stage of change   ADVISE: Luciana was educated on the multiple health risks of obesity as well as the benefit of weight loss to improve her health. She was advised of the need for long term treatment and the importance of lifestyle modifications.  AGREE: Multiple dietary modification options and treatment options were discussed and  Winry agreed to keep a food journal with 1500 calories and 100+ grams of protein at supper daily and follow the Category 3 plan We discussed the following Behavioral Modification Strategies today: increasing lean protein intake and meal planning & cooking strategies

## 2017-07-11 MED FILL — MONTELUKAST SOD 10 MG TAB: 10 | 30 days supply | Qty: 30 | Fill #0

## 2017-07-12 ENCOUNTER — Ambulatory Visit: Payer: 59 | Admitting: Family Medicine

## 2017-07-28 ENCOUNTER — Encounter (INDEPENDENT_AMBULATORY_CARE_PROVIDER_SITE_OTHER): Payer: Self-pay

## 2017-07-28 ENCOUNTER — Other Ambulatory Visit: Payer: Self-pay | Admitting: Family Medicine

## 2017-07-28 ENCOUNTER — Ambulatory Visit (INDEPENDENT_AMBULATORY_CARE_PROVIDER_SITE_OTHER): Payer: 59 | Admitting: Physician Assistant

## 2017-07-29 ENCOUNTER — Telehealth (INDEPENDENT_AMBULATORY_CARE_PROVIDER_SITE_OTHER): Payer: Self-pay

## 2017-07-29 NOTE — Telephone Encounter (Signed)
Patient would like a call back from you.  Cb# is [phone].  Thank You.

## 2017-07-31 NOTE — Telephone Encounter (Signed)
Patient called stating that she needs to talk to either Dr. Louanne Skye or you about her FMLA paperwork.  She stated that she can not afford to pay $25.00 every 6 months.  CB#[phone].  Thank you.

## 2017-07-31 NOTE — Telephone Encounter (Signed)
Can you please call her and discuss this with her? Thanks

## 2017-08-05 MED FILL — VICTOZA 18 MG/3 ML INJECT P: 18 | 30 days supply | Qty: 9 | Fill #2

## 2017-08-05 MED FILL — ATORVASTATIN 20 MG TABLET: 20 | 90 days supply | Qty: 90 | Fill #1

## 2017-08-05 MED FILL — ACCU-CHEK GUIDE TEST STRIP: 30 days supply | Qty: 100 | Fill #0

## 2017-08-05 MED FILL — MONTELUKAST SOD 10 MG TAB: 10 | 90 days supply | Qty: 90 | Fill #0

## 2017-08-05 MED FILL — ACCU-CHEK FASTCLIX LANCETS: 90 days supply | Qty: 102 | Fill #0

## 2017-08-09 ENCOUNTER — Ambulatory Visit (INDEPENDENT_AMBULATORY_CARE_PROVIDER_SITE_OTHER): Payer: 59 | Admitting: Physician Assistant

## 2017-08-09 ENCOUNTER — Encounter: Payer: Self-pay | Admitting: Physician Assistant

## 2017-08-09 ENCOUNTER — Ambulatory Visit (INDEPENDENT_AMBULATORY_CARE_PROVIDER_SITE_OTHER): Payer: 59

## 2017-08-09 VITALS — BP 114/72 | HR 94 | Temp 97.6°F | Resp 18 | Ht 60.0 in | Wt 228.0 lb

## 2017-08-09 DIAGNOSIS — M7672 Peroneal tendinitis, left leg: Secondary | ICD-10-CM

## 2017-08-09 DIAGNOSIS — M79672 Pain in left foot: Secondary | ICD-10-CM

## 2017-08-09 DIAGNOSIS — E559 Vitamin D deficiency, unspecified: Secondary | ICD-10-CM | POA: Diagnosis not present

## 2017-08-09 MED ORDER — VITAMIN D (ERGOCALCIFEROL) 1.25 MG (50000 UNIT) PO CAPS: 50000.0000 [IU] | ORAL_CAPSULE | ORAL | 0 refills | Status: DC

## 2017-08-09 MED ORDER — MELOXICAM 7.5 MG PO TABS: 7.5000 mg | ORAL_TABLET | Freq: Every day | ORAL | 0 refills | Status: DC

## 2017-08-09 NOTE — Patient Instructions (Addendum)
IF you received an x-ray today, you will receive an invoice from [facility]. Please contact [facility] at [phone] with questions or concerns regarding your invoice.   IF you received labwork today, you will receive an invoice from [facility]. Please contact [facility] at [phone] with questions or concerns regarding your invoice.   Our billing staff will not be able to assist you with questions regarding bills from these companies.  You will be contacted with the lab results as soon as they are available. The fastest way to get your results is to activate your My Chart account. Instructions are located on the last page of this paperwork. If you have not heard from [name] regarding the results in 2 weeks, please contact this office.    Peroneal Tendinopathy Peroneal tendinopathy is irritation of the tendons that pass behind your ankle (peroneal tendons). These tendons attach muscles in your foot to a bone on the side of your foot and underneath the arch of your foot. This condition can cause your peroneal tendons to get bigger and swell. What are the causes? This condition may be caused by:  Putting stress on your ankle over and over again (overuse injury).  A sudden injury that puts stress on your tendons, such as an ankle sprain.  What increases the risk? This condition is more likely to develop in:  People who have high arches.  Athletes who play sports that involve putting stress on the ankle over and over again. These sports include: ? Running. ? Dancing. ? Soccer. ? Basketball.  What are the signs or symptoms? Symptoms of this condition can start suddenly or develop gradually. Symptoms include:  Pain in the back of the ankle, on the side of the foot, or in the arch of the foot.  Pain that gets worse with activity and better with rest.  Swelling.  Warmth.  Weakness in your foot or ankle.  How is this diagnosed? This condition may be  diagnosed based on:  Your symptoms.  Your medical history.  A physical exam.  Imaging tests, such as: ? An X-ray or CT scan to check for bone injury. ? MRI or ultrasound to check for muscle or tendon injury.  During your physical exam, your health care provider may move your foot and ankle and test the strength of your leg muscles. How is this treated? This condition may be treated by:  Keeping your body weight off your ankle for several days.  Returning gradually to full activity gradually.  Putting ice on your ankle to reduce swelling.  Taking an anti-inflammatory pain medicine (NSAID).  Having medicine injected into your tendon to reduce swelling.  Wearing a removable boot or brace for ankle support.  Doing range-of-motion exercises and strengthening exercises (physical therapy) when pain and swelling improve.  If the condition does not improve with treatment, or if a tendon or muscle is damaged, surgery may be needed. Follow these instructions at home: If you have a boot or brace:  Wear it as told by your health care provider. Remove it only as told by your health care provider.  Loosen it if your toes tingle, become numb, or turn cold and blue.  Do not let it get wet if it is not waterproof.  Keep it clean. Managing pain, stiffness, and swelling  If directed, apply ice to the injured area: ? Put ice in a plastic bag. ? Place a towel between your skin and the bag. ? Leave the ice  on for 20 minutes, 2-3 times a day.  Take over-the-counter and prescription medicines only as told by your health care provider.  Raise (elevate) your ankle above the level of your heart when resting if you have swelling. Activity  Do not use your ankle to support (bear) your full body weight until your health care provider says that you can.  Do not do activities that make pain or swelling worse.  Return to your normal activities as told by your health care provider. General  instructions  Keep all follow-up visits as told by your health care provider. This is important. How is this prevented?  Wear supportive footwear that is appropriate for your athletic activity.  Avoid athletic activities that cause swelling or pain in your ankle or foot.  See your health care provider if you have pain or swelling that does not improve after a few days of rest.  Stop training if you develop pain or swelling.  If you start a new athletic activity, start gradually to build up your strength, endurance, and flexibility. Contact a health care provider if:  Your symptoms get worse.  Your symptoms do not improve in 2-4 weeks.  You develop new, unexplained symptoms. This information is not intended to replace advice given to you by your health care provider. Make sure you discuss any questions you have with your health care provider. Document Released: 10/28/2005 Document Revised: 07/02/2016 Document Reviewed: 09/16/2015 Elsevier Interactive Patient Education  [employer].

## 2017-08-09 NOTE — Progress Notes (Signed)
Cristina [name]  MRN: [id number] DOB: 12-26-1960  PCP: [name], MD  Chief Complaint  Patient presents with  . Ankle Pain    Left  . Foot Pain    Left    Subjective:  Pt presents to clinic for left foot and "knot" for about 2 months and then lateral ankle pain for about 2 weeks.  She has also noticed a "knot" for about 2 months near her 5th toe.  She had no injury that she knew of.  She had an ankle fracture about 20 years ago.  She has done nothing for it.  Standing and walking makes the pain in the ankle pain.  The pain is intermittent.  Sharp shooting pains from the foot to the ankle.  No swelling.  Pain is located on the outside of the foot and the ankle.  History is obtained by patient.  Review of Systems  Musculoskeletal: Positive for gait problem (2nd to pain). Negative for joint swelling.    Patient Active Problem List   Diagnosis Date Noted  . Type 2 diabetes mellitus without complication, without long-term current use of insulin ([place]) 05/22/2017  . Class 3 obesity with serious comorbidity and body mass index (BMI) of 40.0 to 44.9 in adult ([place]) 05/21/2017  . Family history of colon cancer 04/01/2017  . LUQ abdominal pain 04/01/2017  . Irritable bowel syndrome with diarrhea 04/01/2017  . NSAID long-term use 04/01/2017  . Nausea without vomiting 04/01/2017  . Lymphocytosis 02/24/2017  . Essential hypertension 02/12/2017  . Mild intermittent asthma with acute exacerbation 12/12/2016  . Left shoulder pain 06/02/2015  . Insomnia 05/12/2014  . Fibromyalgia 05/12/2014  . Allergic rhinitis 03/28/2014  . Osteoarthritis 12/20/2013  . Cervical strain 06/21/2013  . Low back pain 06/21/2013  . Metatarsalgia of both feet 03/11/2013  . Sciatica 01/25/2013  . Restless leg syndrome 11/24/2012  . Pure hypercholesterolemia 10/20/2012  . OSA on CPAP 10/20/2012  . Depression 10/20/2012  . Iron deficiency anemia, unspecified 10/01/2012  . Diabetes mellitus, type 2 ([place])  12/31/2010  . ANAL FISSURE 12/31/2010  . Morbid obesity ([place]) 01/08/2007  . CONSTIPATION 01/08/2007    Current Outpatient Prescriptions on File Prior to Visit  Medication Sig Dispense Refill  . acyclovir (ZOVIRAX) 400 MG tablet TAKE 1 TABLET BY MOUTH 2 TIMES DAILY. 180 tablet 3  . albuterol (PROAIR HFA) 108 (90 Base) MCG/ACT inhaler Inhale 2 puffs into the lungs every 6 (six) hours as needed. 18 g 1  . AMBULATORY NON FORMULARY MEDICATION Reported on 10/26/2015    . ANUCORT-HC 25 MG suppository PLACE 1 SUPPOSITORY RECTALLY 2 TIMES DAILY AS NEEDED FOR HEMORRHOIDS. 25 suppository 5  . aspirin EC 81 MG tablet Take 81 mg by mouth daily.    [name] atorvastatin (LIPITOR) 20 MG tablet Take 1 tablet (20 mg total) by mouth daily. 90 tablet 3  . azelastine (ASTELIN) 0.1 % nasal spray PLACE 2 SPRAYS INTO BOTH NOSTRILS 2 TIMES DAILY AS DIRECTED 90 mL 3  . azelastine (OPTIVAR) 0.05 % ophthalmic solution Place 1 drop into both eyes daily. PRN  3  . BIOTIN 5000 PO Take by mouth.    [name] buPROPion (WELLBUTRIN SR) 200 MG 12 hr tablet Take 1 tablet (200 mg total) by mouth daily. 30 tablet 0  . Calcium Carbonate-Vitamin D (CALCIUM + D PO) Take 1 tablet by mouth daily.    . canagliflozin (INVOKANA) 300 MG TABS tablet Take 1 tablet (300 mg total) by mouth daily before  breakfast. 90 tablet 3  . cetirizine (ZYRTEC) 10 MG tablet Take 10 mg by mouth daily.    [name] diltiazem 2 % GEL Apply 1 application topically 3 (three) times daily. 1 Package 3  . docusate sodium (COLACE) 100 MG capsule Take 100 mg by mouth as needed. Reported on 05/04/2016    . ferrous sulfate 325 (65 FE) MG EC tablet Take 325 mg by mouth daily.     . fluticasone (FLONASE) 50 MCG/ACT nasal spray PLACE 2 SPRAYS INTO BOTH NOSTRILS DAILY. 48 g 3  . gabapentin (NEURONTIN) 300 MG capsule Take 1 capsule (300 mg total) by mouth 3 (three) times daily. 270 capsule 1  . glipiZIDE (GLIPIZIDE XL) 10 MG 24 hr tablet TAKE 1 TABLET BY MOUTH DAILY WITH BREAKFAST. 90 tablet 3   . Glucosamine HCl 1000 MG TABS Take 1,000 mg by mouth 2 (two) times daily with a meal.     . glucose blood test strip accucheck guide strips and fast clix lancets Use as instructed dm 2 100 each 1  . hydrocortisone 2.5 % ointment Apply topically 2 (two) times daily. 30 g 0  . Insulin Pen Needle (PEN NEEDLES) 31G X 5 MM MISC 1 each by Does not apply route daily. USE TO INJECT VICTOZA DAILY AS DIRECTED 100 each 3  . liraglutide 18 MG/3ML SOPN Inject 0.3 mLs (1.8 mg total) into the skin daily. 6 mL 11  . metFORMIN (GLUCOPHAGE) 1000 MG tablet TAKE 1 TABLET BY MOUTH 2 TIMES DAILY WITH A MEAL. 180 tablet 3  . montelukast (SINGULAIR) 10 MG tablet Take 1 tablet (10 mg total) by mouth at bedtime. 30 tablet 0  . Multiple Vitamin (MULTIVITAMIN) capsule Take 1 capsule by mouth daily.    . nitroGLYCERIN (NITROSTAT) 0.4 MG SL tablet Place 1 tablet (0.4 mg total) under the tongue every 5 (five) minutes as needed for chest pain. 20 tablet 0  . pantoprazole (PROTONIX) 40 MG tablet TAKE 1 TABLET BY MOUTH 2 TIMES DAILY. 180 tablet 3  . rOPINIRole (REQUIP) 1 MG tablet Take 1 tablet (1 mg total) by mouth at bedtime. 90 tablet 3  . sertraline (ZOLOFT) 100 MG tablet Take 1.5 tablets (150 mg total) by mouth daily. 135 tablet 3  . TRUEPLUS LANCETS 30G MISC USE TO CHECK BLOOD GLUCOSE DAILY AS DIRECTED 100 each 3   Current Facility-Administered Medications on File Prior to Visit  Medication Dose Route Frequency Provider Last Rate Last Dose  . technetium tetrofosmin (TC-MYOVIEW) injection 87.5 millicurie  64.3 millicurie Intravenous Once PRN Larey Dresser, MD        Allergies  Allergen Reactions  . Fish Oil Rash    Past Medical History:  Diagnosis Date  . Allergic rhinitis, cause unspecified   . Anemia   . Arthritis   . Asthma   . Back pain   . Chest pain   . Constipation   . CTS (carpal tunnel syndrome)   . Cystocele   . Depression   . Diabetes mellitus   . Dysfunction of eustachian tube   . Fatty  liver   . Fissure, anal   . Gallbladder problem   . Genital herpes   . GERD (gastroesophageal reflux disease)   . Hemorrhoid   . Hx of migraine headaches   . Hyperlipidemia   . Hypertension   . IBS (irritable bowel syndrome)   . Insomnia   . Iron deficiency anemia, unspecified   . Joint pain   . Nausea   .  Obesity   . OSA on CPAP   . Osteoarthritis   . Rectocele   . Sleep apnea   . TMJ (dislocation of temporomandibular joint)    Social History   Social History Narrative   Marital status:  Divorced in 2000 after six years of marriage; not dating in 2017.      Children:  3 daughters (38, 41, 27); 6 grandchildren.      Lives: alone.  2 dogs; grandson stays some.      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      Tobacco: none      Alcohol: socially; special occasions.  Rare.      Drugs: none      Exercise:  none      Sexual activity:  Not sexually active since 2011.       Guns:  No guns in the home.       Smoke detectors in use,       Seatbelt:  Sometimes uses seat belts. 75% of time.  No texting while driving.     Social History  Substance Use Topics  . Smoking status: Former Research scientist (life sciences)  . Smokeless tobacco: Never Used     Comment: smoked occasionally longest 6 mos.  . Alcohol use No   family history includes Alcohol abuse in her brother; Arthritis in her mother; Cancer in her paternal grandmother; Cancer (age of onset: 68) in her mother; Cirrhosis in her brother; Colon cancer in her maternal grandmother, paternal aunt, and paternal grandmother; Colon cancer (age of onset: 43) in her mother; Colon polyps in her brother; Diabetes in her daughter and mother; Hyperlipidemia in her brother and mother; Hypertension in her daughter; Hypertension (age of onset: 19) in her mother; Mental illness in her daughter and daughter; Migraines in her brother, daughter, and daughter; Obesity in her mother; Obstructive Sleep Apnea in her brother; Osteoarthritis in her  mother; Protein S deficiency in her daughter; Sleep apnea in her brother and daughter; Stroke in her maternal grandmother; Sudden death in her mother.     Objective:  BP 114/72   Pulse 94   Temp 97.6 F (36.4 C) (Oral)   Resp 18   Ht 5' (1.524 m)   Wt 228 lb (103.4 kg)   SpO2 96%   BMI 44.53 kg/m  Body mass index is 44.53 kg/m.  Physical Exam  Constitutional: She is oriented to person, place, and time and well-developed, well-nourished, and in no distress.  HENT:  Head: Normocephalic and atraumatic.  Right Ear: Hearing and external ear normal.  Left Ear: Hearing and external ear normal.  Eyes: Conjunctivae are normal.  Neck: Normal range of motion.  Pulmonary/Chest: Effort normal.  Musculoskeletal:       Left foot: There is tenderness. There is normal range of motion.       Feet:  Neurological: She is alert and oriented to person, place, and time. Gait normal.  Skin: Skin is warm and dry.  Psychiatric: Mood, memory, affect and judgment normal.  Vitals reviewed.   Dg Foot Complete Left  Result Date: 08/09/2017 CLINICAL DATA:  Foot pain involving the base of the left fifth digit for the past 2 months. No known injury. EXAM: LEFT FOOT - COMPLETE 3+ VIEW COMPARISON:  None. FINDINGS: No fracture or dislocation. Joint spaces appear preserved. No erosions. No significant hallux valgus deformity. Small plantar calcaneal spur. Regional soft tissues appear normal. No radiopaque foreign body. IMPRESSION: Small plantar calcaneal spur.  Otherwise, no explanation for patient's foot pain, with special attention paid to the fifth digit. Electronically Signed   By: Sandi Mariscal M.D.   On: 08/09/2017 13:09    Assessment and Plan :  Foot pain, left - Plan: DG Foot Complete Left, meloxicam (MOBIC) 7.5 MG tablet - unsure of the type of nodule - she had no injury she does not wear high shoes - ? Tendon irritation - she will use ice massage and NSAIDs and if no better we can refer to foot specialist  for possible injection.  Peroneal tendinitis of left lower extremity - Plan: meloxicam (MOBIC) 7.5 MG tablet - related to change in gait from the pain from the nodule.  Vitamin D deficiency - Plan: Vitamin D, Ergocalciferol, (DRISDOL) 50000 units CAPS capsule - refilled medication at patient's request.  Windell Hummingbird PA-C  [facility] 08/09/2017 3:04 PM

## 2017-08-11 MED FILL — VIT D2 1.25 MG (50,000 UNIT: 1.25 MG | 84 days supply | Qty: 12 | Fill #0

## 2017-08-11 MED FILL — MELOXICAM 7.5 MG TABLET: 7.5 | 15 days supply | Qty: 30 | Fill #0

## 2017-08-18 NOTE — Telephone Encounter (Signed)
IC patient and she understands this is our policy.

## 2017-08-22 MED FILL — PANTOPRAZOLE SOD DR 40 MG T: 40 | 90 days supply | Qty: 180 | Fill #0

## 2017-08-22 MED FILL — GABAPENTIN 300 MG CAPSULE: 300 | 90 days supply | Qty: 270 | Fill #0

## 2017-08-26 MED FILL — INVOKANA 300 MG TABLET: 300 | 90 days supply | Qty: 90 | Fill #1

## 2017-08-28 ENCOUNTER — Ambulatory Visit (INDEPENDENT_AMBULATORY_CARE_PROVIDER_SITE_OTHER): Payer: 59 | Admitting: Surgery

## 2017-09-03 ENCOUNTER — Encounter (INDEPENDENT_AMBULATORY_CARE_PROVIDER_SITE_OTHER): Payer: Self-pay | Admitting: Specialist

## 2017-09-03 ENCOUNTER — Ambulatory Visit (INDEPENDENT_AMBULATORY_CARE_PROVIDER_SITE_OTHER): Payer: 59

## 2017-09-03 ENCOUNTER — Ambulatory Visit (INDEPENDENT_AMBULATORY_CARE_PROVIDER_SITE_OTHER): Payer: 59 | Admitting: Family Medicine

## 2017-09-03 ENCOUNTER — Ambulatory Visit (INDEPENDENT_AMBULATORY_CARE_PROVIDER_SITE_OTHER): Payer: 59 | Admitting: Specialist

## 2017-09-03 ENCOUNTER — Encounter: Payer: Self-pay | Admitting: Family Medicine

## 2017-09-03 VITALS — BP 111/66 | HR 85 | Ht 61.0 in | Wt 225.0 lb

## 2017-09-03 VITALS — BP 118/72 | HR 78 | Temp 98.0°F | Resp 16 | Ht 60.63 in | Wt 227.0 lb

## 2017-09-03 DIAGNOSIS — M48062 Spinal stenosis, lumbar region with neurogenic claudication: Secondary | ICD-10-CM | POA: Diagnosis not present

## 2017-09-03 DIAGNOSIS — Z9989 Dependence on other enabling machines and devices: Secondary | ICD-10-CM

## 2017-09-03 DIAGNOSIS — M7989 Other specified soft tissue disorders: Secondary | ICD-10-CM | POA: Diagnosis not present

## 2017-09-03 DIAGNOSIS — M25561 Pain in right knee: Secondary | ICD-10-CM

## 2017-09-03 DIAGNOSIS — E7800 Pure hypercholesterolemia, unspecified: Secondary | ICD-10-CM | POA: Diagnosis not present

## 2017-09-03 DIAGNOSIS — G4733 Obstructive sleep apnea (adult) (pediatric): Secondary | ICD-10-CM | POA: Diagnosis not present

## 2017-09-03 DIAGNOSIS — E119 Type 2 diabetes mellitus without complications: Secondary | ICD-10-CM | POA: Diagnosis not present

## 2017-09-03 DIAGNOSIS — S8012XA Contusion of left lower leg, initial encounter: Secondary | ICD-10-CM | POA: Diagnosis not present

## 2017-09-03 DIAGNOSIS — F3289 Other specified depressive episodes: Secondary | ICD-10-CM

## 2017-09-03 DIAGNOSIS — G2581 Restless legs syndrome: Secondary | ICD-10-CM

## 2017-09-03 DIAGNOSIS — M797 Fibromyalgia: Secondary | ICD-10-CM | POA: Diagnosis not present

## 2017-09-03 LAB — POCT GLYCOSYLATED HEMOGLOBIN (HGB A1C): HEMOGLOBIN A1C: 7.3

## 2017-09-03 LAB — GLUCOSE, POCT (MANUAL RESULT ENTRY): POC Glucose: 111 mg/dl — AB (ref 70–99)

## 2017-09-03 NOTE — Progress Notes (Signed)
Office Visit Note   Patient: Cristina [name]           Date of Birth: December 21, 1960           MRN: [id number] Visit Date: 09/03/2017              Requested by: [name], MD [id number] [place], [place] [address] PCP: [name], MD   Assessment & Plan: Visit Diagnoses:  1. Right knee pain, unspecified chronicity   2. Spinal stenosis of lumbar region with neurogenic claudication     Plan: The main ways of treat osteoarthritis, that are found to be success. Weight loss helps to decrease pain. Exercise is important to maintaining cartilage and thickness and strengthening. NSAIDs like tylenol,and meloxicam are meds decreasing the inflamation. Ice is okay  In afternoon and evening and hot shower in the am Avoid bending, stooping and avoid lifting weights greater than 10 lbs. Avoid prolong standing and walking. Avoid frequent bending and stooping  No lifting greater than 10 lbs. May use ice or moist heat for pain. Weight loss is of benefit. Handicap license is approved. Walking in a swimming pool and reclining bicycle may be of benefit, use of an elliptical. Wear the right knee brace when symptomatic and for the first 2 weeks while at work. Terminal quadriceps strengthening exercises, avoid squatting and stairs. Follow-Up Instructions: Return in about 4 weeks (around 10/01/2017).   Orders:  Orders Placed This Encounter  Procedures  . XR KNEE 3 VIEW RIGHT   No orders of the defined types were placed in this encounter.     Procedures: No procedures performed   Clinical Data: No additional findings.   Subjective: Chief Complaint  Patient presents with  . Right Knee - Pain    56 year old female with low back pain, she has pain with standing and walking and improves with sitting. Not able to walk too far, here to the parking lot and " I start hurting". On scale of  1-10 it's a 6 or 7. Improves with sitting sometimes she has to lie down for it to get better. No  bowel or bladder difficulties has some urgency. Numbness in the legs intermittantly. Has right CTS and this does bother her nightly. Recent right wrist EMGs/NCV show moderate to severe. The numbness and tingling is intermittant. She uses the wrist splint intermittantly.  She is here for right greater than left knee pain, it changes sides, today it is the right knee. Unable to squat  On the right side or left side. She relates having had a right patella dislocation at  Age 36 years old while just walking. She went to the ER and she was put asleep the had reduction and then the use of a cast for 6 weeks then discontinued.    Review of Systems  Constitutional: Negative.   HENT: Negative.   Eyes: Negative.   Respiratory: Negative.   Cardiovascular: Negative.   Gastrointestinal: Negative.   Endocrine: Negative.   Genitourinary: Negative.   Musculoskeletal: Negative.   Skin: Negative.   Allergic/Immunologic: Negative.   Neurological: Negative.   Hematological: Negative.   Psychiatric/Behavioral: Negative.      Objective: Vital Signs: BP 111/66 (BP Location: Left Arm, Patient Position: Sitting)   Pulse 85   Ht 5' 1"  (1.549 m)   Wt 225 lb (102.1 kg)   BMI 42.51 kg/m   Physical Exam  Constitutional: She is oriented to person, place, and time. She appears well-developed  and well-nourished.  HENT:  Head: Normocephalic and atraumatic.  Eyes: Pupils are equal, round, and reactive to light. EOM are normal.  Neck: Normal range of motion. Neck supple.  Pulmonary/Chest: Effort normal and breath sounds normal.  Abdominal: Soft. Bowel sounds are normal.  Musculoskeletal:       Right knee: She exhibits no effusion.       Left knee: She exhibits no effusion.  Neurological: She is alert and oriented to person, place, and time.  Skin: Skin is warm and dry.  Psychiatric: She has a normal mood and affect. Her behavior is normal. Judgment and thought content normal.    Right Knee Exam    Tenderness  The patient is experiencing tenderness in the patella and medial joint line.  Range of Motion  Extension:  5 normal  Flexion: 120   Muscle Strength   The patient has normal right knee strength.  Tests  McMurray:  Medial - negative Lateral - negative Lachman:  Anterior - negative    Posterior - negative Drawer:       Anterior - negative    Posterior - negative Varus: negative Valgus: negative Pivot Shift: negative Patellar Apprehension: positive  Other  Erythema: absent Scars: absent Sensation: normal Pulse: present Swelling: mild Other tests: no effusion present  Comments:  Hypermobile right patella, grating PF joint line with flexion and extension Right patella alta.    Left Knee Exam   Tenderness  The patient is experiencing tenderness in the patella and medial joint line.  Range of Motion  Extension: 5  Flexion: 130   Muscle Strength   The patient has normal left knee strength.  Tests  McMurray:  Medial - negative Lateral - negative Lachman:  Anterior - negative    Posterior - negative Drawer:       Anterior - negative     Posterior - negative Varus: negative Valgus: negative Patellar Apprehension: positive  Other  Erythema: absent Scars: absent Pulse: present Swelling: mild Effusion: no effusion present  Comments:  Hypermobile left patella, grating PF joint line with flexion and extension   Back Exam   Tenderness  The patient is experiencing tenderness in the lumbar.  Range of Motion  Extension: abnormal  Flexion: normal  Lateral Bend Right: normal  Lateral Bend Left: normal  Rotation Right: normal  Rotation Left: normal   Muscle Strength  Right Quadriceps:  5/5  Left Quadriceps:  5/5  Right Hamstrings:  5/5  Left Hamstrings:  5/5   Tests  Straight leg raise right: negative Straight leg raise left: negative  Reflexes  Patellar: normal Achilles: normal Biceps: normal Babinski's sign: normal   Other  Toe  Walk: normal Heel Walk: normal Sensation: normal Gait: abnormal  Erythema: no back redness Scars: absent      Specialty Comments:  No specialty comments available.  Imaging: No results found.   PMFS History: Patient Active Problem List   Diagnosis Date Noted  . Type 2 diabetes mellitus without complication, without long-term current use of insulin ([place]) 05/22/2017  . Class 3 obesity with serious comorbidity and body mass index (BMI) of 40.0 to 44.9 in adult 05/21/2017  . Family history of colon cancer 04/01/2017  . LUQ abdominal pain 04/01/2017  . Irritable bowel syndrome with diarrhea 04/01/2017  . NSAID long-term use 04/01/2017  . Nausea without vomiting 04/01/2017  . Lymphocytosis 02/24/2017  . Essential hypertension 02/12/2017  . Mild intermittent asthma with acute exacerbation 12/12/2016  . Left shoulder pain 06/02/2015  .  Insomnia 05/12/2014  . Fibromyalgia 05/12/2014  . Allergic rhinitis 03/28/2014  . Osteoarthritis 12/20/2013  . Cervical strain 06/21/2013  . Low back pain 06/21/2013  . Metatarsalgia of both feet 03/11/2013  . Sciatica 01/25/2013  . Restless leg syndrome 11/24/2012  . Pure hypercholesterolemia 10/20/2012  . OSA on CPAP 10/20/2012  . Depression 10/20/2012  . Iron deficiency anemia, unspecified 10/01/2012  . Diabetes mellitus, type 2 ([place]) 12/31/2010  . ANAL FISSURE 12/31/2010  . Morbid obesity ([place]) 01/08/2007  . CONSTIPATION 01/08/2007   Past Medical History:  Diagnosis Date  . Allergic rhinitis, cause unspecified   . Anemia   . Arthritis   . Asthma   . Back pain   . Chest pain   . Constipation   . CTS (carpal tunnel syndrome)   . Cystocele   . Depression   . Diabetes mellitus   . Dysfunction of eustachian tube   . Fatty liver   . Fissure, anal   . Gallbladder problem   . Genital herpes   . GERD (gastroesophageal reflux disease)   . Hemorrhoid   . Hx of migraine headaches   . Hyperlipidemia   . Hypertension   . IBS  (irritable bowel syndrome)   . Insomnia   . Iron deficiency anemia, unspecified   . Joint pain   . Nausea   . Obesity   . OSA on CPAP   . Osteoarthritis   . Rectocele   . Sleep apnea   . TMJ (dislocation of temporomandibular joint)     Family History  Problem Relation Age of Onset  . Hypertension Mother 51  . Osteoarthritis Mother   . Diabetes Mother   . Colon cancer Mother 20  . Cancer Mother 49       colon cancer  . Hyperlipidemia Mother   . Obesity Mother   . Sudden death Mother   . Arthritis Mother   . Colon cancer Paternal Grandmother   . Cancer Paternal Grandmother        colon cancer  . Stroke Maternal Grandmother   . Colon cancer Maternal Grandmother   . Colon polyps Brother        x 2 brother  . Diabetes Daughter   . Hypertension Daughter   . Sleep apnea Daughter   . Mental illness Daughter        depression  . Migraines Daughter   . Cirrhosis Brother        alcoholism  . Alcohol abuse Brother   . Migraines Daughter   . Mental illness Daughter        anxiety attacks  . Protein S deficiency Daughter   . Sleep apnea Brother   . Hyperlipidemia Brother   . Migraines Brother   . Obstructive Sleep Apnea Brother   . Colon cancer Paternal Aunt     Past Surgical History:  Procedure Laterality Date  . ABDOMINAL HYSTERECTOMY  11/11/2010   [place] Plan.  Ovaries intact.  Uterine fibroids with DUB.  [name] CARPAL TUNNEL RELEASE  1989   Left  . CHOLECYSTECTOMY    . CHOLECYSTECTOMY    . COLONOSCOPY  06/29/2012   normal.  Repeat in 5 years.  [name] [place]  . ESOPHAGOGASTRODUODENOSCOPY  12/13/2011   dysphagia.  Henrene Pastor.  Normal.  . Sleep study  09/11/2012   severe OSA.  CPAP titration at 12 cm water pressure.  . TUBAL LIGATION  1988   Social History   Occupational History  . [employer]   Social History Main Topics  . Smoking status: Former Research scientist (life sciences)  . Smokeless tobacco: Never Used     Comment:  smoked occasionally longest 6 mos.  . Alcohol use No  . Drug use: No  . Sexual activity: No

## 2017-09-03 NOTE — Patient Instructions (Addendum)
The main ways of treat osteoarthritis, that are found to be success. Weight loss helps to decrease pain. Exercise is important to maintaining cartilage and thickness and strengthening. NSAIDs like tylenol,and meloxicam are meds decreasing the inflamation. Ice is okay  In afternoon and evening and hot shower in the am Avoid bending, stooping and avoid lifting weights greater than 10 lbs. Avoid prolong standing and walking. Avoid frequent bending and stooping  No lifting greater than 10 lbs. May use ice or moist heat for pain. Weight loss is of benefit. Handicap license is approved. Walking in a swimming pool and reclining bicycle may be of benefit, use of an elliptical. Wear the right knee brace when symptomatic and for the first 2 weeks while at work. Terminal quadriceps strengthening exercises, avoid squatting and stairs.

## 2017-09-03 NOTE — Patient Instructions (Signed)
     IF you received an x-ray today, you will receive an invoice from [facility]. Please contact [facility] at [phone] with questions or concerns regarding your invoice.   IF you received labwork today, you will receive an invoice from [facility]. Please contact [facility] at [phone] with questions or concerns regarding your invoice.   Our billing staff will not be able to assist you with questions regarding bills from these companies.  You will be contacted with the lab results as soon as they are available. The fastest way to get your results is to activate your My Chart account. Instructions are located on the last page of this paperwork. If you have not heard from us regarding the results in 2 weeks, please contact this office.     

## 2017-09-03 NOTE — Progress Notes (Signed)
Subjective:    Patient ID: Cristina [name], female    DOB: 08-06-1961, 56 y.o.   MRN: [id number]  09/03/2017  Cyst (on the left leg at the calf muscle  x 1 month )    HPI This 56 y.o. female presents for evaluation of L calf knot.  Hit L medial calf on suite ca[address]; swelled with really large bruise; now with residual knot.  No longer seeing Dr. Leafy Ro; only change in management was increasing Wellbutrin to 200mg  daily; then was late for an appointment and wanted to charge $25.   Still in school.  Stressed out all the time.  Considering gastric sleeve. Plans to increase benicar.  Will get grant money next semester in winter.   Sugars running 143; usually 115-116.  Poptart last night. Did eat cheeseburger.  Made a hamburger at home. Low fat bread and mustard.  Onions.    BP Readings from Last 3 Encounters:  09/03/17 118/72  09/03/17 111/66  08/09/17 114/72   Wt Readings from Last 3 Encounters:  09/03/17 227 lb (103 kg)  09/03/17 225 lb (102.1 kg)  08/09/17 228 lb (103.4 kg)   Immunization History  Administered Date(s) Administered  . Hepatitis B 11/12/1999  . Influenza Split 11/11/2010, 08/14/2012, 08/25/2014  . Influenza-Unspecified 07/12/2016, 08/01/2017  . Pneumococcal Polysaccharide-23 11/12/2007, 05/04/2016  . Td 11/11/1998  . Tdap 02/22/2010    Review of Systems  Constitutional: Negative for chills, diaphoresis, fatigue and fever.  Eyes: Negative for visual disturbance.  Respiratory: Negative for cough and shortness of breath.   Cardiovascular: Positive for leg swelling. Negative for chest pain and palpitations.  Gastrointestinal: Negative for abdominal pain, constipation, diarrhea, nausea and vomiting.  Endocrine: Negative for cold intolerance, heat intolerance, polydipsia, polyphagia and polyuria.  Musculoskeletal: Positive for myalgias.  Skin: Negative for color change, pallor, rash and wound.  Neurological: Negative for dizziness, tremors, seizures, syncope,  facial asymmetry, speech difficulty, weakness, light-headedness, numbness and headaches.    Past Medical History:  Diagnosis Date  . Allergic rhinitis, cause unspecified   . Anemia   . Arthritis   . Asthma   . Back pain   . Chest pain   . Constipation   . CTS (carpal tunnel syndrome)   . Cystocele   . Depression   . Diabetes mellitus   . Dysfunction of eustachian tube   . Fatty liver   . Fissure, anal   . Gallbladder problem   . Genital herpes   . GERD (gastroesophageal reflux disease)   . Hemorrhoid   . Hx of migraine headaches   . Hyperlipidemia   . Hypertension   . IBS (irritable bowel syndrome)   . Insomnia   . Iron deficiency anemia, unspecified   . Joint pain   . Nausea   . Obesity   . OSA on CPAP   . Osteoarthritis   . Rectocele   . Sleep apnea   . TMJ (dislocation of temporomandibular joint)    Past Surgical History:  Procedure Laterality Date  . ABDOMINAL HYSTERECTOMY  11/11/2010   Marvel Plan.  Ovaries intact.  Uterine fibroids with DUB.  [name] CARPAL TUNNEL RELEASE  1989   Left  . CHOLECYSTECTOMY    . CHOLECYSTECTOMY    . COLONOSCOPY  06/29/2012   normal.  Repeat in 5 years.  [name] [place]  . ESOPHAGOGASTRODUODENOSCOPY  12/13/2011   dysphagia.  Henrene Pastor.  Normal.  . Sleep study  09/11/2012   severe OSA.  CPAP titration at  12 cm water pressure.  . TUBAL LIGATION  1988   Allergies  Allergen Reactions  . Fish Oil Rash   Current Outpatient Prescriptions on File Prior to Visit  Medication Sig Dispense Refill  . acyclovir (ZOVIRAX) 400 MG tablet TAKE 1 TABLET BY MOUTH 2 TIMES DAILY. 180 tablet 3  . albuterol (PROAIR HFA) 108 (90 Base) MCG/ACT inhaler Inhale 2 puffs into the lungs every 6 (six) hours as needed. 18 g 1  . AMBULATORY NON FORMULARY MEDICATION Reported on 10/26/2015    . ANUCORT-HC 25 MG suppository PLACE 1 SUPPOSITORY RECTALLY 2 TIMES DAILY AS NEEDED FOR HEMORRHOIDS. 25 suppository 5  . aspirin EC 81 MG tablet Take 81 mg by  mouth daily.    [name] atorvastatin (LIPITOR) 20 MG tablet Take 1 tablet (20 mg total) by mouth daily. 90 tablet 3  . azelastine (ASTELIN) 0.1 % nasal spray PLACE 2 SPRAYS INTO BOTH NOSTRILS 2 TIMES DAILY AS DIRECTED 90 mL 3  . azelastine (OPTIVAR) 0.05 % ophthalmic solution Place 1 drop into both eyes daily. PRN  3  . BIOTIN 5000 PO Take by mouth.    [name] buPROPion (WELLBUTRIN SR) 200 MG 12 hr tablet Take 1 tablet (200 mg total) by mouth daily. 30 tablet 0  . Calcium Carbonate-Vitamin D (CALCIUM + D PO) Take 1 tablet by mouth daily.    . canagliflozin (INVOKANA) 300 MG TABS tablet Take 1 tablet (300 mg total) by mouth daily before breakfast. 90 tablet 3  . cetirizine (ZYRTEC) 10 MG tablet Take 10 mg by mouth daily.    [name] diltiazem 2 % GEL Apply 1 application topically 3 (three) times daily. 1 Package 3  . docusate sodium (COLACE) 100 MG capsule Take 100 mg by mouth as needed. Reported on 05/04/2016    . ferrous sulfate 325 (65 FE) MG EC tablet Take 325 mg by mouth daily.     . fluticasone (FLONASE) 50 MCG/ACT nasal spray PLACE 2 SPRAYS INTO BOTH NOSTRILS DAILY. 48 g 3  . gabapentin (NEURONTIN) 300 MG capsule Take 1 capsule (300 mg total) by mouth 3 (three) times daily. 270 capsule 1  . glipiZIDE (GLIPIZIDE XL) 10 MG 24 hr tablet TAKE 1 TABLET BY MOUTH DAILY WITH BREAKFAST. 90 tablet 3  . Glucosamine HCl 1000 MG TABS Take 1,000 mg by mouth 2 (two) times daily with a meal.     . glucose blood test strip accucheck guide strips and fast clix lancets Use as instructed dm 2 100 each 1  . hydrocortisone 2.5 % ointment Apply topically 2 (two) times daily. 30 g 0  . Insulin Pen Needle (PEN NEEDLES) 31G X 5 MM MISC 1 each by Does not apply route daily. USE TO INJECT VICTOZA DAILY AS DIRECTED 100 each 3  . liraglutide 18 MG/3ML SOPN Inject 0.3 mLs (1.8 mg total) into the skin daily. 6 mL 11  . meloxicam (MOBIC) 7.5 MG tablet Take 1-2 tablets (7.5-15 mg total) by mouth daily. 30 tablet 0  . metFORMIN (GLUCOPHAGE)  1000 MG tablet TAKE 1 TABLET BY MOUTH 2 TIMES DAILY WITH A MEAL. 180 tablet 3  . montelukast (SINGULAIR) 10 MG tablet Take 1 tablet (10 mg total) by mouth at bedtime. 30 tablet 0  . Multiple Vitamin (MULTIVITAMIN) capsule Take 1 capsule by mouth daily.    . nitroGLYCERIN (NITROSTAT) 0.4 MG SL tablet Place 1 tablet (0.4 mg total) under the tongue every 5 (five) minutes as needed for chest pain. 20 tablet 0  .  pantoprazole (PROTONIX) 40 MG tablet TAKE 1 TABLET BY MOUTH 2 TIMES DAILY. 180 tablet 3  . rOPINIRole (REQUIP) 1 MG tablet Take 1 tablet (1 mg total) by mouth at bedtime. 90 tablet 3  . sertraline (ZOLOFT) 100 MG tablet Take 1.5 tablets (150 mg total) by mouth daily. 135 tablet 3  . TRUEPLUS LANCETS 30G MISC USE TO CHECK BLOOD GLUCOSE DAILY AS DIRECTED 100 each 3  . Vitamin D, Ergocalciferol, (DRISDOL) 50000 units CAPS capsule Take 1 capsule (50,000 Units total) by mouth every 7 (seven) days. 12 capsule 0   Current Facility-Administered Medications on File Prior to Visit  Medication Dose Route Frequency Provider Last Rate Last Dose  . technetium tetrofosmin (TC-MYOVIEW) injection 71.6 millicurie  96.7 millicurie Intravenous Once PRN Larey Dresser, MD       Social History   Social History  . Marital status: Single    Spouse name: n/a  . Number of children: 3  . Years of education: college   Occupational History  . [employer]   Social History Main Topics  . Smoking status: Former Research scientist (life sciences)  . Smokeless tobacco: Never Used     Comment: smoked occasionally longest 6 mos.  . Alcohol use No  . Drug use: No  . Sexual activity: No   Other Topics Concern  . Not on file   Social History Narrative   Marital status:  Divorced in 2000 after six years of marriage; not dating in 2017.      Children:  3 daughters (38, 4, 57); 6 grandchildren.      Lives: alone.  2 dogs; grandson stays some.      Employment:  Working at [employer]. 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      Tobacco: none      Alcohol: socially; special occasions.  Rare.      Drugs: none      Exercise:  none      Sexual activity:  Not sexually active since 2011.       Guns:  No guns in the home.       Smoke detectors in use,       Seatbelt:  Sometimes uses seat belts. 75% of time.  No texting while driving.     Family History  Problem Relation Age of Onset  . Hypertension Mother 2  . Osteoarthritis Mother   . Diabetes Mother   . Colon cancer Mother 10  . Cancer Mother 43       colon cancer  . Hyperlipidemia Mother   . Obesity Mother   . Sudden death Mother   . Arthritis Mother   . Colon cancer Paternal Grandmother   . Cancer Paternal Grandmother        colon cancer  . Stroke Maternal Grandmother   . Colon cancer Maternal Grandmother   . Colon polyps Brother        x 2 brother  . Diabetes Daughter   . Hypertension Daughter   . Sleep apnea Daughter   . Mental illness Daughter        depression  . Migraines Daughter   . Cirrhosis Brother        alcoholism  . Alcohol abuse Brother   . Migraines Daughter   . Mental illness Daughter        anxiety attacks  . Protein S deficiency Daughter   . Sleep apnea Brother   . Hyperlipidemia Brother   .  Migraines Brother   . Obstructive Sleep Apnea Brother   . Colon cancer Paternal Aunt        Objective:    BP 118/72   Pulse 78   Temp 98 F (36.7 C) (Oral)   Resp 16   Ht 5' 0.63" (1.54 m)   Wt 227 lb (103 kg)   SpO2 98%   BMI 43.42 kg/m  Physical Exam  Constitutional: She is oriented to person, place, and time. She appears well-developed and well-nourished. No distress.  HENT:  Head: Normocephalic and atraumatic.  Eyes: Pupils are equal, round, and reactive to light. Conjunctivae and EOM are normal.  Neck: Normal range of motion. Neck supple. Carotid bruit is not present. No thyromegaly present.  Cardiovascular: Normal rate, regular rhythm, normal heart sounds and intact  distal pulses.  Exam reveals no gallop and no friction rub.   No murmur heard. Pulmonary/Chest: Effort normal and breath sounds normal. She has no wheezes. She has no rales.  Musculoskeletal:       Legs: 1.5cm area of localized induration LEFT medial proximal lower leg; mildly tender to palpation.  Ecchymoses at site; non-warm. No fluctuance.   Lymphadenopathy:    She has no cervical adenopathy.  Neurological: She is alert and oriented to person, place, and time. No cranial nerve deficit.  Skin: Skin is warm and dry. No rash noted. She is not diaphoretic. No erythema. No pallor.  Psychiatric: She has a normal mood and affect. Her behavior is normal.   No results found. Depression screen [facility] 2/9 09/03/2017 08/09/2017 05/21/2017 04/23/2017 02/12/2017  Decreased Interest 0 0 0 3 0  Down, Depressed, Hopeless 0 0 0 3 0  PHQ - 2 Score 0 0 0 6 0  Altered sleeping - - - 3 -  Tired, decreased energy - - - 3 -  Change in appetite - - - 2 -  Feeling bad or failure about yourself  - - - 1 -  Trouble concentrating - - - 1 -  Moving slowly or fidgety/restless - - - 1 -  Suicidal thoughts - - - - -  PHQ-9 Score - - - 17 -  Difficult doing work/chores - - - - -  Some recent data might be hidden   Fall Risk  09/03/2017 08/09/2017 05/21/2017 02/12/2017 12/18/2016  Falls in the past year? No No No No No  Number falls in past yr: - - - - -  Injury with Fall? - - - - -  [place] for fall due to : - - - - -        Assessment & Plan:   1. Left leg swelling   2. Contusion of left calf, initial encounter   3. Type 2 diabetes mellitus without complication, without long-term current use of insulin ([place])   4. OSA on CPAP   5. Fibromyalgia   6. Pure hypercholesterolemia   7. Restless leg syndrome   8. Other depression     -New onset LEFT leg swelling consistent with contusion LEFT calf; send for venous doppler to rule out DVT. -obtain labs for chronic disease management.   -no longer  seeing bariatric weight loss clinic; encourage exercise and attempts at weight loss.    Orders Placed This Encounter  Procedures  . CBC with Differential/Platelet  . Comprehensive metabolic panel  . POCT glycosylated hemoglobin (Hb A1C)  . POCT glucose (manual entry)   No orders of the defined types were  placed in this encounter.   No Follow-up on file.   Ott Zimmerle Elayne Guerin, M.D. [facility] at [facility] [address] [place], [place]  [address] [phone] phone [phone] fax

## 2017-09-04 ENCOUNTER — Telehealth: Payer: Self-pay

## 2017-09-04 ENCOUNTER — Ambulatory Visit (HOSPITAL_COMMUNITY)
Admission: RE | Admit: 2017-09-04 | Discharge: 2017-09-04 | Disposition: A | Discharge status: Home / Self Care | Payer: 59 | Source: Ambulatory Visit | Attending: Cardiovascular Disease | Admitting: Cardiovascular Disease

## 2017-09-04 ENCOUNTER — Telehealth: Payer: Self-pay | Admitting: Family Medicine

## 2017-09-04 DIAGNOSIS — X58XXXA Exposure to other specified factors, initial encounter: Secondary | ICD-10-CM | POA: Diagnosis not present

## 2017-09-04 DIAGNOSIS — M7989 Other specified soft tissue disorders: Secondary | ICD-10-CM

## 2017-09-04 DIAGNOSIS — S8012XA Contusion of left lower leg, initial encounter: Secondary | ICD-10-CM | POA: Diagnosis not present

## 2017-09-04 NOTE — Telephone Encounter (Signed)
Reordered dvt study

## 2017-09-04 NOTE — Telephone Encounter (Signed)
Changed order for Dvt study

## 2017-09-04 NOTE — Telephone Encounter (Signed)
Pt called to let us know she is having her doppler venous today at 4:30pm at [facility]. She did say the tech said the order was put in wrong but they did not tell her what it needed to be so she thinks they are fixing it. I told her if we needed to change anything to please let us know. Thanks!

## 2017-09-05 LAB — CBC WITH DIFFERENTIAL/PLATELET
BASOS: 0 %
Basophils Absolute: 0 10*3/uL (ref 0.0–0.2)
EOS (ABSOLUTE): 0.2 10*3/uL (ref 0.0–0.4)
EOS: 2 %
HEMATOCRIT: 42.6 % (ref 34.0–46.6)
HEMOGLOBIN: 13.8 g/dL (ref 11.1–15.9)
Immature Grans (Abs): 0 10*3/uL (ref 0.0–0.1)
Immature Granulocytes: 0 %
LYMPHS ABS: 6.2 10*3/uL — AB (ref 0.7–3.1)
Lymphs: 46 %
MCH: 26.5 pg — AB (ref 26.6–33.0)
MCHC: 32.4 g/dL (ref 31.5–35.7)
MCV: 82 fL (ref 79–97)
MONOCYTES: 6 %
Monocytes Absolute: 0.8 10*3/uL (ref 0.1–0.9)
NEUTROS ABS: 6.1 10*3/uL (ref 1.4–7.0)
Neutrophils: 46 %
Platelets: 318 10*3/uL (ref 150–379)
RBC: 5.2 x10E6/uL (ref 3.77–5.28)
RDW: 16 % — ABNORMAL HIGH (ref 12.3–15.4)
WBC: 13.4 10*3/uL — ABNORMAL HIGH (ref 3.4–10.8)

## 2017-09-05 LAB — COMPREHENSIVE METABOLIC PANEL
ALBUMIN: 4.3 g/dL (ref 3.5–5.5)
ALK PHOS: 87 IU/L (ref 39–117)
ALT: 23 IU/L (ref 0–32)
AST: 14 IU/L (ref 0–40)
Albumin/Globulin Ratio: 1.4 (ref 1.2–2.2)
BILIRUBIN TOTAL: 0.3 mg/dL (ref 0.0–1.2)
BUN / CREAT RATIO: 24 — AB (ref 9–23)
BUN: 21 mg/dL (ref 6–24)
CHLORIDE: 101 mmol/L (ref 96–106)
CO2: 25 mmol/L (ref 20–29)
CREATININE: 0.87 mg/dL (ref 0.57–1.00)
Calcium: 10 mg/dL (ref 8.7–10.2)
GFR calc non Af Amer: 76 mL/min/{1.73_m2} (ref >59)
GFR, EST AFRICAN AMERICAN: 87 mL/min/{1.73_m2} (ref >59)
GLOBULIN, TOTAL: 3.1 g/dL (ref 1.5–4.5)
Glucose: 113 mg/dL — ABNORMAL HIGH (ref 65–99)
Potassium: 4.5 mmol/L (ref 3.5–5.2)
SODIUM: 142 mmol/L (ref 134–144)
Total Protein: 7.4 g/dL (ref 6.0–8.5)

## 2017-09-18 MED FILL — SERTRALINE HCL 100 MG TAB: 100 | 90 days supply | Qty: 135 | Fill #1

## 2017-09-18 MED FILL — glipiZIDE ER 10 MG TB24: 10 | 90 days supply | Qty: 90 | Fill #1

## 2017-09-18 MED FILL — rOPINIRole HCL 1 MG TABS: 1 | 90 days supply | Qty: 90 | Fill #1

## 2017-09-18 MED FILL — VICTOZA 18 MG/3 ML INJECT P: 18 | 30 days supply | Qty: 9 | Fill #3

## 2017-09-22 MED FILL — ACYCLOVIR 400 MG TABLET: 400 | 90 days supply | Qty: 180 | Fill #1

## 2017-10-13 ENCOUNTER — Ambulatory Visit (INDEPENDENT_AMBULATORY_CARE_PROVIDER_SITE_OTHER): Payer: 59 | Admitting: Physician Assistant

## 2017-10-13 ENCOUNTER — Other Ambulatory Visit: Payer: Self-pay

## 2017-10-13 ENCOUNTER — Encounter: Payer: Self-pay | Admitting: Physician Assistant

## 2017-10-13 VITALS — BP 126/78 | HR 87 | Resp 16 | Ht 61.0 in | Wt 234.4 lb

## 2017-10-13 DIAGNOSIS — R109 Unspecified abdominal pain: Secondary | ICD-10-CM | POA: Diagnosis not present

## 2017-10-13 LAB — POCT URINALYSIS DIP (MANUAL ENTRY)
Bilirubin, UA: NEGATIVE
Glucose, UA: 500 mg/dL — AB
Ketones, POC UA: NEGATIVE mg/dL
Leukocytes, UA: NEGATIVE
Nitrite, UA: NEGATIVE
PH UA: 5.5 (ref 5.0–8.0)
Protein Ur, POC: NEGATIVE mg/dL
RBC UA: NEGATIVE
SPEC GRAV UA: 1.015 (ref 1.010–1.025)
Urobilinogen, UA: 0.2 E.U./dL

## 2017-10-13 LAB — POCT CBC
GRANULOCYTE PERCENT: 45.3 % (ref 37–80)
HCT, POC: 43.5 % (ref 37.7–47.9)
HEMOGLOBIN: 14 g/dL (ref 12.2–16.2)
LYMPH, POC: 6.7 — AB (ref 0.6–3.4)
MCH: 26.3 pg — AB (ref 27–31.2)
MCHC: 32.1 g/dL (ref 31.8–35.4)
MCV: 81.8 fL (ref 80–97)
MID (cbc): 0.4 (ref 0–0.9)
MPV: 7.8 fL (ref 0–99.8)
PLATELET COUNT, POC: 361 10*3/uL (ref 142–424)
POC GRANULOCYTE: 5.8 (ref 2–6.9)
POC LYMPH PERCENT: 51.9 %L — AB (ref 10–50)
POC MID %: 2.8 %M (ref 0–12)
RBC: 5.31 M/uL (ref 4.04–5.48)
RDW, POC: 17.1 %
WBC: 12.9 10*3/uL — AB (ref 4.6–10.2)

## 2017-10-13 MED FILL — metFORMIN HCL 1000 MG TABS: 1000 | 90 days supply | Qty: 180 | Fill #1

## 2017-10-13 NOTE — Patient Instructions (Addendum)
  For flank pain take 1000 mg of tylenol every 8 hours as needed     IF you received an x-ray today, you will receive an invoice from [facility]. Please contact [facility] at [phone] with questions or concerns regarding your invoice.   IF you received labwork today, you will receive an invoice from [facility]. Please contact [facility] at [phone] with questions or concerns regarding your invoice.   Our billing staff will not be able to assist you with questions regarding bills from these companies.  You will be contacted with the lab results as soon as they are available. The fastest way to get your results is to activate your My Chart account. Instructions are located on the last page of this paperwork. If you have not heard from [name] regarding the results in 2 weeks, please contact this office.

## 2017-10-13 NOTE — Progress Notes (Signed)
10/13/2017 4:33 PM   DOB: 12/15/1960 / MRN: [id number]  SUBJECTIVE:  Cristina [name] is a 56 y.o. female presenting for right sided flank pain that started today. Pain is worse with movement and with deep breathing.  She is moving her bowels normally. Denies dysuria and hematuria.  Complains of some mild urinary frequency. No changes in diet.  No rash.  She has had her gall bladder removed. Does have a history of UTI.   She is allergic to fish oil.   She  has a past medical history of Allergic rhinitis, cause unspecified, Anemia, Arthritis, Asthma, Back pain, Chest pain, Constipation, CTS (carpal tunnel syndrome), Cystocele, Depression, Diabetes mellitus, Dysfunction of eustachian tube, Fatty liver, Fissure, anal, Gallbladder problem, Genital herpes, GERD (gastroesophageal reflux disease), Hemorrhoid, migraine headaches, Hyperlipidemia, Hypertension, IBS (irritable bowel syndrome), Insomnia, Iron deficiency anemia, unspecified, Joint pain, Nausea, Obesity, OSA on CPAP, Osteoarthritis, Rectocele, Sleep apnea, and TMJ (dislocation of temporomandibular joint).    She  reports that she has quit smoking. she has never used smokeless tobacco. She reports that she does not drink alcohol or use drugs. She  reports that she does not engage in sexual activity. The patient  has a past surgical history that includes Cholecystectomy; Carpal tunnel release (1989); Ectopic pregnancy surgery (1993); Tubal ligation (1988); Cholecystectomy; Sleep study (09/11/2012); Abdominal hysterectomy (11/11/2010); Colonoscopy (06/29/2012); and Esophagogastroduodenoscopy (12/13/2011).  Her family history includes Alcohol abuse in her brother; Arthritis in her mother; Cancer in her paternal grandmother; Cancer (age of onset: 10) in her mother; Cirrhosis in her brother; Colon cancer in her maternal grandmother, paternal aunt, and paternal grandmother; Colon cancer (age of onset: 48) in her mother; Colon polyps in her brother; Diabetes in her  daughter and mother; Hyperlipidemia in her brother and mother; Hypertension in her daughter; Hypertension (age of onset: 67) in her mother; Mental illness in her daughter and daughter; Migraines in her brother, daughter, and daughter; Obesity in her mother; Obstructive Sleep Apnea in her brother; Osteoarthritis in her mother; Protein S deficiency in her daughter; Sleep apnea in her brother and daughter; Stroke in her maternal grandmother; Sudden death in her mother.  Review of Systems  Constitutional: Negative for chills, diaphoresis and fever.  Respiratory: Negative for cough, hemoptysis, sputum production, shortness of breath and wheezing.   Cardiovascular: Negative for chest pain, orthopnea and leg swelling.  Gastrointestinal: Negative for abdominal pain, heartburn and nausea.  Genitourinary: Positive for flank pain and frequency. Negative for dysuria, hematuria and urgency.  Skin: Negative for itching and rash.  Neurological: Negative for dizziness.    The problem list and medications were reviewed and updated by myself where necessary and exist elsewhere in the encounter.   OBJECTIVE:  BP 126/78 (BP Location: Right Arm, Patient Position: Sitting, Cuff Size: Large)   Pulse 87   Resp 16   Ht 5' 1"  (1.549 m)   Wt 234 lb 6.4 oz (106.3 kg)   SpO2 98%   BMI 44.29 kg/m   Lab Results  Component Value Date   WBC 12.9 (A) 10/13/2017   HGB 14.0 10/13/2017   HCT 43.5 10/13/2017   MCV 81.8 10/13/2017   PLT 318 09/03/2017    Lab Results  Component Value Date   CREATININE 0.87 09/03/2017   BUN 21 09/03/2017   NA 142 09/03/2017   K 4.5 09/03/2017   CL 101 09/03/2017   CO2 25 09/03/2017    Lab Results  Component Value Date   ALT 23 09/03/2017  AST 14 09/03/2017   ALKPHOS 87 09/03/2017   BILITOT 0.3 09/03/2017    Lab Results  Component Value Date   TSH 1.420 04/23/2017    Lab Results  Component Value Date   HGBA1C 7.3 09/03/2017    Lab Results  Component Value Date     CHOL 226 (H) 04/23/2017   HDL 46 04/23/2017   LDLCALC 141 (H) 04/23/2017   TRIG 196 (H) 04/23/2017   CHOLHDL 4.2 02/12/2017     Physical Exam  Constitutional: She is active.  Non-toxic appearance.  Cardiovascular: Normal rate, regular rhythm, S1 normal, S2 normal, normal heart sounds and intact distal pulses. Exam reveals no gallop, no friction rub and no decreased pulses.  No murmur heard. Pulmonary/Chest: Effort normal. No stridor. No tachypnea. No respiratory distress. She has no wheezes. She has no rales.  Abdominal: Soft. Bowel sounds are normal. She exhibits no distension and no mass. There is tenderness. There is no rebound and no guarding.  Musculoskeletal: She exhibits no edema.  Neurological: She is alert.  Skin: Skin is warm and dry. No rash noted. She is not diaphoretic. No pallor.    Results for orders placed or performed in visit on 10/13/17 (from the past 72 hour(s))  POCT CBC     Status: Abnormal   Collection Time: 10/13/17  4:18 PM  Result Value Ref Range   WBC 12.9 (A) 4.6 - 10.2 K/uL   Lymph, poc 6.7 (A) 0.6 - 3.4   POC LYMPH PERCENT 51.9 (A) 10 - 50 %L   MID (cbc) 0.4 0 - 0.9   POC MID % 2.8 0 - 12 %M   POC Granulocyte 5.8 2 - 6.9   Granulocyte percent 45.3 37 - 80 %G   RBC 5.31 4.04 - 5.48 M/uL   Hemoglobin 14.0 12.2 - 16.2 g/dL   HCT, POC 43.5 37.7 - 47.9 %   MCV 81.8 80 - 97 fL   MCH, POC 26.3 (A) 27 - 31.2 pg   MCHC 32.1 31.8 - 35.4 g/dL   RDW, POC 17.1 %   Platelet Count, POC 361 142 - 424 K/uL   MPV 7.8 0 - 99.8 fL  POCT urinalysis dipstick     Status: Abnormal   Collection Time: 10/13/17  4:29 PM  Result Value Ref Range   Color, UA yellow yellow   Clarity, UA clear clear   Glucose, UA =500 (A) negative mg/dL   Bilirubin, UA negative negative   Ketones, POC UA negative negative mg/dL   Spec Grav, UA 1.015 1.010 - 1.025   Blood, UA negative negative   pH, UA 5.5 5.0 - 8.0   Protein Ur, POC negative negative mg/dL   Urobilinogen, UA 0.2  0.2 or 1.0 E.U./dL   Nitrite, UA Negative Negative   Leukocytes, UA Negative Negative     No results found.  ASSESSMENT AND PLAN:  Cristina [name] was seen today for pain.  Diagnoses and all orders for this visit:  Flank pain: CBC relatively normal for her. Urine with glucose however she is prescribed SGLT2 and most recent A1c shows good control.  I think her pain is MSK in origin.  Advised tylenol for now given low side effect profile.  RTC precautions discussed.  -     POCT CBC -     POCT urinalysis dipstick -     CMP and Liver -     Lipase    The patient is advised to call or return to clinic if  she does not see an improvement in symptoms, or to seek the care of the closest emergency department if she worsens with the above plan.   Philis Fendt, MHS, PA-C [facility] at [facility] 10/13/2017 4:33 PM

## 2017-10-14 LAB — CMP AND LIVER
ALK PHOS: 96 IU/L (ref 39–117)
ALT: 28 IU/L (ref 0–32)
AST: 16 IU/L (ref 0–40)
Albumin: 4.3 g/dL (ref 3.5–5.5)
BILIRUBIN, DIRECT: 0.07 mg/dL (ref 0.00–0.40)
BUN: 15 mg/dL (ref 6–24)
Bilirubin Total: <0.2 mg/dL (ref 0.0–1.2)
CO2: 23 mmol/L (ref 20–29)
Calcium: 9.8 mg/dL (ref 8.7–10.2)
Chloride: 102 mmol/L (ref 96–106)
Creatinine, Ser: 0.73 mg/dL (ref 0.57–1.00)
GFR calc Af Amer: 108 mL/min/{1.73_m2} (ref >59)
GFR calc non Af Amer: 94 mL/min/{1.73_m2} (ref >59)
Glucose: 162 mg/dL — ABNORMAL HIGH (ref 65–99)
Potassium: 4.6 mmol/L (ref 3.5–5.2)
SODIUM: 143 mmol/L (ref 134–144)
TOTAL PROTEIN: 7.5 g/dL (ref 6.0–8.5)

## 2017-10-14 LAB — LIPASE: LIPASE: 68 U/L (ref 14–72)

## 2017-10-15 ENCOUNTER — Ambulatory Visit (INDEPENDENT_AMBULATORY_CARE_PROVIDER_SITE_OTHER): Payer: 59 | Admitting: Specialist

## 2017-10-15 ENCOUNTER — Encounter (INDEPENDENT_AMBULATORY_CARE_PROVIDER_SITE_OTHER): Payer: Self-pay | Admitting: Specialist

## 2017-10-15 VITALS — BP 109/67 | HR 85 | Ht 61.0 in | Wt 225.0 lb

## 2017-10-15 DIAGNOSIS — G5601 Carpal tunnel syndrome, right upper limb: Secondary | ICD-10-CM | POA: Diagnosis not present

## 2017-10-15 DIAGNOSIS — M48062 Spinal stenosis, lumbar region with neurogenic claudication: Secondary | ICD-10-CM

## 2017-10-15 DIAGNOSIS — M170 Bilateral primary osteoarthritis of knee: Secondary | ICD-10-CM

## 2017-10-15 NOTE — Progress Notes (Signed)
Office Visit Note   Patient: Cristina [name]           Date of Birth: 1961/05/01           MRN: [id number] Visit Date: 10/15/2017              Requested by: [name], MD [address] [place], [place] [address] PCP: [name], MD   Assessment & Plan: Visit Diagnoses:  1. Carpal tunnel syndrome, right upper limb   2. Spinal stenosis of lumbar region with neurogenic claudication   3. Bilateral primary osteoarthritis of knee     Plan: The main ways of treat osteoarthritis, that are found to be success. Weight loss helps to decrease pain. Exercise is important to maintaining cartilage and thickness and strengthening. NSAIDs like motrin, tylenol, alleve are meds decreasing the inflamation. Ice is okay  In afternoon and evening and hot shower in the am Avoid bending, stooping and avoid lifting weights greater than 10 lbs. Avoid prolong standing and walking. Avoid frequent bending and stooping  No lifting greater than 10 lbs. May use ice or moist heat for pain. Handicap license is approved. For CTS use the wrist brace as needed, daily or at night. NSAIDs, We will schedule for right CTR surgery, Kandice Hams our surgery scheduling secretary will call and discuss scheduling this surgery.    Follow-Up Instructions: Return in about 6 weeks (around 11/26/2017).   Orders:  No orders of the defined types were placed in this encounter.  No orders of the defined types were placed in this encounter.     Procedures: No procedures performed   Clinical Data: No additional findings.   Subjective: Chief Complaint  Patient presents with  . Lower Back - Follow-up  . Right Knee - Follow-up    56 year old female, RMA with , she has lumbar spinal stenosis with spondylolisthesis and degenerative joint disease of the knee caps. No previous knee surgeries. Previous injections do not help. Kneeling, squatting and stairclimbing increase her knee pain. Both knees are  severe, the left knee is requiring the use of the brace pretty much continuously. No bowel or bladder difficulties. No pain with cough or sneeze. Trouble with sleeping always, sometimes back other times hands and other times her knees. The left SI denervation helped and she reports that the pain seems to be recurring.     Review of Systems  Constitutional: Positive for activity change. Negative for unexpected weight change.  HENT: Negative for congestion, ear discharge, facial swelling, hearing loss, mouth sores, postnasal drip, rhinorrhea, sinus pain, sneezing, sore throat and tinnitus.   Eyes: Negative.  Negative for photophobia, pain, discharge, redness, itching and visual disturbance.  Respiratory: Negative.  Negative for apnea, cough, choking, chest tightness, shortness of breath, wheezing and stridor.   Cardiovascular: Negative for chest pain, palpitations and leg swelling.  Gastrointestinal: Negative.  Negative for abdominal distention, abdominal pain, constipation, diarrhea, nausea, rectal pain and vomiting.  Endocrine: Negative.  Negative for cold intolerance and heat intolerance.  Genitourinary: Negative.  Negative for difficulty urinating, dyspareunia, dysuria, enuresis, flank pain, frequency, genital sores and hematuria.  Musculoskeletal: Positive for arthralgias, back pain, gait problem and joint swelling.  Skin: Negative.  Negative for color change, pallor, rash and wound.  Allergic/Immunologic: Negative.  Negative for environmental allergies and food allergies.  Neurological: Negative for dizziness, tremors, syncope, weakness, light-headedness, numbness and headaches.  Hematological: Negative.   Psychiatric/Behavioral: Negative.  Negative for agitation, behavioral problems, confusion, decreased  concentration, dysphoric mood, hallucinations, self-injury, sleep disturbance and suicidal ideas. The patient is not nervous/anxious and is not hyperactive.      Objective: Vital Signs:  BP 109/67 (BP Location: Left Arm, Patient Position: Sitting)   Pulse 85   Ht 5' 1"  (1.549 m)   Wt 225 lb (102.1 kg)   BMI 42.51 kg/m   Physical Exam  Constitutional: She is oriented to person, place, and time. She appears well-developed and well-nourished. No distress.  HENT:  Head: Normocephalic and atraumatic.  Eyes: EOM are normal. Pupils are equal, round, and reactive to light. Right eye exhibits no discharge. Left eye exhibits no discharge.  Neck: Normal range of motion. Neck supple. No tracheal deviation present. No thyromegaly present.  Pulmonary/Chest: Effort normal and breath sounds normal. No stridor. No respiratory distress. She has no wheezes.  Abdominal: Soft. Bowel sounds are normal. She exhibits no distension and no mass. There is no tenderness. There is no guarding.  Musculoskeletal: She exhibits no edema or deformity.       Left knee: She exhibits no effusion.  Neurological: She is alert and oriented to person, place, and time. She displays normal reflexes. No cranial nerve deficit. Coordination normal.  Skin: Skin is warm and dry. No rash noted. She is not diaphoretic. No erythema. No pallor.  Psychiatric: She has a normal mood and affect. Her behavior is normal. Judgment and thought content normal.    Right Knee Exam  Right knee exam is normal.   Left Knee Exam   Tenderness  The patient is experiencing tenderness in the patella, medial retinaculum and patellar tendon.  Range of Motion  Extension: normal  Flexion:  120 abnormal   Tests  McMurray:  Medial - negative Lateral - negative Varus: negative Valgus: negative Lachman:  Anterior - negative    Posterior - negative Drawer:  Anterior - negative     Posterior - negative Pivot shift: 1+ Patellar apprehension: 2+  Other  Erythema: absent Scars: absent Sensation: normal Pulse: present Swelling: mild Effusion: no effusion present   Back Exam   Tenderness  The patient is experiencing tenderness in  the lumbar.  Range of Motion  Extension: abnormal  Flexion: abnormal  Lateral bend right: abnormal  Rotation right: abnormal  Rotation left: abnormal   Muscle Strength  Right Quadriceps:  5/5  Left Quadriceps:  5/5  Right Hamstrings:  5/5   Tests  Straight leg raise right: negative Straight leg raise left: negative  Reflexes  Patellar: normal Achilles: normal Biceps: normal Babinski's sign: normal   Other  Toe walk: normal Heel walk: normal Sensation: normal Gait: antalgic       Specialty Comments:  No specialty comments available.  Imaging: No results found.   PMFS History: Patient Active Problem List   Diagnosis Date Noted  . Type 2 diabetes mellitus without complication, without long-term current use of insulin ([place]) 05/22/2017  . Class 3 obesity with serious comorbidity and body mass index (BMI) of 40.0 to 44.9 in adult 05/21/2017  . Family history of colon cancer 04/01/2017  . Irritable bowel syndrome with diarrhea 04/01/2017  . NSAID long-term use 04/01/2017  . Lymphocytosis 02/24/2017  . Essential hypertension 02/12/2017  . Mild intermittent asthma with acute exacerbation 12/12/2016  . Left shoulder pain 06/02/2015  . Insomnia 05/12/2014  . Fibromyalgia 05/12/2014  . Allergic rhinitis 03/28/2014  . Osteoarthritis 12/20/2013  . Cervical strain 06/21/2013  . Low back pain 06/21/2013  . Metatarsalgia of both feet 03/11/2013  .  Sciatica 01/25/2013  . Restless leg syndrome 11/24/2012  . Pure hypercholesterolemia 10/20/2012  . OSA on CPAP 10/20/2012  . Depression 10/20/2012  . Iron deficiency anemia, unspecified 10/01/2012  . Diabetes mellitus, type 2 ([place]) 12/31/2010  . ANAL FISSURE 12/31/2010  . Morbid obesity ([place]) 01/08/2007  . CONSTIPATION 01/08/2007   Past Medical History:  Diagnosis Date  . Allergic rhinitis, cause unspecified   . Anemia   . Arthritis   . Asthma   . Back pain   . Chest pain   . Constipation   . CTS (carpal tunnel  syndrome)   . Cystocele   . Depression   . Diabetes mellitus   . Dysfunction of eustachian tube   . Fatty liver   . Fissure, anal   . Gallbladder problem   . Genital herpes   . GERD (gastroesophageal reflux disease)   . Hemorrhoid   . Hx of migraine headaches   . Hyperlipidemia   . Hypertension   . IBS (irritable bowel syndrome)   . Insomnia   . Iron deficiency anemia, unspecified   . Joint pain   . Nausea   . Obesity   . OSA on CPAP   . Osteoarthritis   . Rectocele   . Sleep apnea   . TMJ (dislocation of temporomandibular joint)     Family History  Problem Relation Age of Onset  . Hypertension Mother 43  . Osteoarthritis Mother   . Diabetes Mother   . Colon cancer Mother 26  . Cancer Mother 26       colon cancer  . Hyperlipidemia Mother   . Obesity Mother   . Sudden death Mother   . Arthritis Mother   . Colon cancer Paternal Grandmother   . Cancer Paternal Grandmother        colon cancer  . Stroke Maternal Grandmother   . Colon cancer Maternal Grandmother   . Colon polyps Brother        x 2 brother  . Diabetes Daughter   . Hypertension Daughter   . Sleep apnea Daughter   . Mental illness Daughter        depression  . Migraines Daughter   . Cirrhosis Brother        alcoholism  . Alcohol abuse Brother   . Migraines Daughter   . Mental illness Daughter        anxiety attacks  . Protein S deficiency Daughter   . Sleep apnea Brother   . Hyperlipidemia Brother   . Migraines Brother   . Obstructive Sleep Apnea Brother   . Colon cancer Paternal Aunt     Past Surgical History:  Procedure Laterality Date  . ABDOMINAL HYSTERECTOMY  11/11/2010   [place] Plan.  Ovaries intact.  Uterine fibroids with DUB.  [name] CARPAL TUNNEL RELEASE  1989   Left  . CHOLECYSTECTOMY    . CHOLECYSTECTOMY    . COLONOSCOPY  06/29/2012   normal.  Repeat in 5 years.  [name] [place]  . ESOPHAGOGASTRODUODENOSCOPY  12/13/2011   dysphagia.  Henrene Pastor.  Normal.  . Sleep study   09/11/2012   severe OSA.  CPAP titration at 12 cm water pressure.  . TUBAL LIGATION  1988   Social History   Occupational History  . Occupation: Audiological scientist: [employer]    Comment: [employer]  Tobacco Use  . Smoking status: Former Research scientist (life sciences)  . Smokeless tobacco: Never Used  . Tobacco comment: smoked  occasionally longest 6 mos.  Substance and Sexual Activity  . Alcohol use: No    Alcohol/week: 0.6 oz    Types: 1 Glasses of wine per week  . Drug use: No  . Sexual activity: No    Birth control/protection: None

## 2017-10-15 NOTE — Patient Instructions (Addendum)
The main ways of treat osteoarthritis, that are found to be success. Weight loss helps to decrease pain. Exercise is important to maintaining cartilage and thickness and strengthening. NSAIDs like motrin, tylenol, alleve are meds decreasing the inflamation. Ice is okay  In afternoon and evening and hot shower in the am Avoid bending, stooping and avoid lifting weights greater than 10 lbs. Avoid prolong standing and walking. Avoid frequent bending and stooping  No lifting greater than 10 lbs. May use ice or moist heat for pain. Handicap license is approved.  For CTS use the wrist brace as needed, daily or at night. NSAIDs,  Will have surgiery schedule. We will schedule for right CTR surgery, Kandice Hams our surgery scheduling secretary will call and discuss scheduling this surgery.

## 2017-10-16 MED FILL — UNIFINE PENTIPS 31GX3/16": 31G X 5 MM | 90 days supply | Qty: 100 | Fill #1

## 2017-10-16 MED FILL — UNIFINE PENTIPS 31GX3/16: 31G X 5 MM | 90 days supply | Qty: 100 | Fill #1

## 2017-10-22 MED FILL — VICTOZA 18 MG/3 ML INJECT P: 18 | 90 days supply | Qty: 27 | Fill #4

## 2017-10-26 ENCOUNTER — Emergency Department (HOSPITAL_COMMUNITY): Payer: 59

## 2017-10-26 ENCOUNTER — Encounter (HOSPITAL_COMMUNITY): Payer: Self-pay | Admitting: Emergency Medicine

## 2017-10-26 ENCOUNTER — Emergency Department (HOSPITAL_COMMUNITY)
Admission: EM | Admit: 2017-10-26 | Discharge: 2017-10-26 | Disposition: A | Discharge status: Home / Self Care | Payer: 59 | Attending: Emergency Medicine | Admitting: Emergency Medicine

## 2017-10-26 DIAGNOSIS — E119 Type 2 diabetes mellitus without complications: Secondary | ICD-10-CM | POA: Insufficient documentation

## 2017-10-26 DIAGNOSIS — Z87891 Personal history of nicotine dependence: Secondary | ICD-10-CM | POA: Diagnosis not present

## 2017-10-26 DIAGNOSIS — Z794 Long term (current) use of insulin: Secondary | ICD-10-CM | POA: Insufficient documentation

## 2017-10-26 DIAGNOSIS — Z7982 Long term (current) use of aspirin: Secondary | ICD-10-CM | POA: Insufficient documentation

## 2017-10-26 DIAGNOSIS — I10 Essential (primary) hypertension: Secondary | ICD-10-CM | POA: Diagnosis not present

## 2017-10-26 DIAGNOSIS — M25562 Pain in left knee: Secondary | ICD-10-CM | POA: Diagnosis not present

## 2017-10-26 DIAGNOSIS — W19XXXA Unspecified fall, initial encounter: Secondary | ICD-10-CM

## 2017-10-26 DIAGNOSIS — Z79899 Other long term (current) drug therapy: Secondary | ICD-10-CM | POA: Diagnosis not present

## 2017-10-26 DIAGNOSIS — M25462 Effusion, left knee: Secondary | ICD-10-CM | POA: Diagnosis not present

## 2017-10-26 DIAGNOSIS — J45909 Unspecified asthma, uncomplicated: Secondary | ICD-10-CM | POA: Diagnosis not present

## 2017-10-26 DIAGNOSIS — S8992XA Unspecified injury of left lower leg, initial encounter: Secondary | ICD-10-CM | POA: Diagnosis not present

## 2017-10-26 DIAGNOSIS — Y92009 Unspecified place in unspecified non-institutional (private) residence as the place of occurrence of the external cause: Secondary | ICD-10-CM

## 2017-10-26 MED ORDER — HYDROCODONE-ACETAMINOPHEN 5-325 MG PO TABS
1.0000 | ORAL_TABLET | Freq: Once | ORAL | Status: AC
  Administered 2017-10-26: 1 via ORAL
  Filled 2017-10-26: qty 1

## 2017-10-26 MED ORDER — NAPROXEN 500 MG PO TABS: 500.0000 mg | ORAL_TABLET | Freq: Two times a day (BID) | ORAL | 0 refills | Status: DC

## 2017-10-26 NOTE — ED Triage Notes (Signed)
Pt reports she tripped and fell yesterday morning, reports L pain. Pt has been ambulatory.

## 2017-10-26 NOTE — ED Notes (Signed)
Ortho tech paged again for crutches and knee immobilizer.

## 2017-10-26 NOTE — ED Notes (Signed)
Ortho tech en route 

## 2017-10-26 NOTE — ED Notes (Signed)
Ortho tech paged for crutches and knee immobilizer

## 2017-10-26 NOTE — Progress Notes (Signed)
Orthopedic Tech Progress Note Patient Details:  Cristina [name] [birth year] [id number]  Ortho Devices Type of Ortho Device: Knee Immobilizer, Crutches Ortho Device/Splint Interventions: Application   Post Interventions Patient Tolerated: Well, Ambulated well Instructions Provided: Poper ambulation with device, Care of device, Adjustment of device   [place] Pink 10/26/2017, 8:59 AM

## 2017-10-26 NOTE — ED Provider Notes (Signed)
[place] [facility] EMERGENCY DEPARTMENT Provider Note   CSN: [id number] Arrival date & time: 10/26/17  0405     History   Chief Complaint Chief Complaint  Patient presents with  . Fall  . Leg Pain    HPI Cristina [name] is a 56 y.o. female.  Patient presents with left leg and knee pain after a fall yesterday morning.  States she slipped in water and her right leg went in front of her and her left leg went behind her and she injured her left knee.  Did not hit head or lose consciousness.  Pain is been steadily worsening to the point where she is not able to ambulate.  She took a friend's Vicodin without relief.  She denies any weakness, numbness or tingling.  No neck or back pain.  She states she has a history of bad kneecaps but has not had any surgery.   The history is provided by the patient.  Fall  Pertinent negatives include no chest pain, no abdominal pain, no headaches and no shortness of breath.  Leg Pain      Past Medical History:  Diagnosis Date  . Allergic rhinitis, cause unspecified   . Anemia   . Arthritis   . Asthma   . Back pain   . Chest pain   . Constipation   . CTS (carpal tunnel syndrome)   . Cystocele   . Depression   . Diabetes mellitus   . Dysfunction of eustachian tube   . Fatty liver   . Fissure, anal   . Gallbladder problem   . Genital herpes   . GERD (gastroesophageal reflux disease)   . Hemorrhoid   . Hx of migraine headaches   . Hyperlipidemia   . Hypertension   . IBS (irritable bowel syndrome)   . Insomnia   . Iron deficiency anemia, unspecified   . Joint pain   . Nausea   . Obesity   . OSA on CPAP   . Osteoarthritis   . Rectocele   . Sleep apnea   . TMJ (dislocation of temporomandibular joint)     Patient Active Problem List   Diagnosis Date Noted  . Type 2 diabetes mellitus without complication, without long-term current use of insulin ([place]) 05/22/2017  . Class 3 obesity with serious comorbidity and body mass  index (BMI) of 40.0 to 44.9 in adult 05/21/2017  . Family history of colon cancer 04/01/2017  . Irritable bowel syndrome with diarrhea 04/01/2017  . NSAID long-term use 04/01/2017  . Lymphocytosis 02/24/2017  . Essential hypertension 02/12/2017  . Mild intermittent asthma with acute exacerbation 12/12/2016  . Left shoulder pain 06/02/2015  . Insomnia 05/12/2014  . Fibromyalgia 05/12/2014  . Allergic rhinitis 03/28/2014  . Osteoarthritis 12/20/2013  . Cervical strain 06/21/2013  . Low back pain 06/21/2013  . Metatarsalgia of both feet 03/11/2013  . Sciatica 01/25/2013  . Restless leg syndrome 11/24/2012  . Pure hypercholesterolemia 10/20/2012  . OSA on CPAP 10/20/2012  . Depression 10/20/2012  . Iron deficiency anemia, unspecified 10/01/2012  . Diabetes mellitus, type 2 ([place]) 12/31/2010  . ANAL FISSURE 12/31/2010  . Morbid obesity ([place]) 01/08/2007  . CONSTIPATION 01/08/2007    Past Surgical History:  Procedure Laterality Date  . ABDOMINAL HYSTERECTOMY  11/11/2010   [facility] Plan.  Ovaries intact.  Uterine fibroids with DUB.  [name] CARPAL TUNNEL RELEASE  1989   Left  . CHOLECYSTECTOMY    . CHOLECYSTECTOMY    . COLONOSCOPY  06/29/2012  normal.  Repeat in 5 years.  [name] [place]  . ESOPHAGOGASTRODUODENOSCOPY  12/13/2011   dysphagia.  Henrene Pastor.  Normal.  . Sleep study  09/11/2012   severe OSA.  CPAP titration at 12 cm water pressure.  . TUBAL LIGATION  1988    OB History    Gravida Para Term Preterm AB Living   3             SAB TAB Ectopic Multiple Live Births                   Home Medications    Prior to Admission medications   Medication Sig Start Date End Date Taking? Authorizing Provider  acyclovir (ZOVIRAX) 400 MG tablet TAKE 1 TABLET BY MOUTH 2 TIMES DAILY. 05/21/17   [name], MD  albuterol [facility] HFA) [phone] Base) MCG/ACT inhaler Inhale 2 puffs into the lungs every 6 (six) hours as needed. 05/21/17   [name], MD  AMBULATORY NON  FORMULARY MEDICATION Reported on 10/26/2015    [provider]  ANUCORT-HC 25 MG suppository PLACE 1 SUPPOSITORY RECTALLY 2 TIMES DAILY AS NEEDED FOR HEMORRHOIDS. 05/21/17   [name], MD  aspirin EC 81 MG tablet Take 81 mg by mouth daily.    [provider]  atorvastatin (LIPITOR) 20 MG tablet Take 1 tablet (20 mg total) by mouth daily. 05/21/17   [name], MD  azelastine (ASTELIN) 0.1 % nasal spray PLACE 2 SPRAYS INTO BOTH NOSTRILS 2 TIMES DAILY AS DIRECTED 05/21/17   [name], MD  azelastine (OPTIVAR) 0.05 % ophthalmic solution Place 1 drop into both eyes daily. PRN 03/13/16   [provider]  BIOTIN 5000 PO Take by mouth.    [provider]  buPROPion (WELLBUTRIN SR) 200 MG 12 hr tablet Take 1 tablet (200 mg total) by mouth daily. 07/10/17   Waldon Merl, PA-C  Calcium Carbonate-Vitamin D (CALCIUM + D PO) Take 1 tablet by mouth daily.    [provider]  canagliflozin (INVOKANA) 300 MG TABS tablet Take 1 tablet (300 mg total) by mouth daily before breakfast. 05/21/17   [name], MD  cetirizine (ZYRTEC) 10 MG tablet Take 10 mg by mouth daily.    [provider]  diltiazem 2 % GEL Apply 1 application topically 3 (three) times daily. 04/01/17   Zehr, Laban Emperor, PA-C  docusate sodium (COLACE) 100 MG capsule Take 100 mg by mouth as needed. Reported on 05/04/2016    [provider]  ferrous sulfate 325 (65 FE) MG EC tablet Take 325 mg by mouth daily.     [provider]  fluticasone (FLONASE) 50 MCG/ACT nasal spray PLACE 2 SPRAYS INTO BOTH NOSTRILS DAILY. 05/21/17   [name], MD  gabapentin (NEURONTIN) 300 MG capsule Take 1 capsule (300 mg total) by mouth 3 (three) times daily. 05/21/17   [name], MD  glipiZIDE (GLIPIZIDE XL) 10 MG 24 hr tablet TAKE 1 TABLET BY MOUTH DAILY WITH BREAKFAST. 05/21/17   [name], MD  Glucosamine HCl 1000 MG TABS Take 1,000 mg by mouth 2 (two) times daily with a  meal.     [provider]  glucose blood test strip accucheck guide strips and fast clix lancets Use as instructed dm 2 05/21/17   [name], MD  hydrocortisone 2.5 % ointment Apply topically 2 (two) times daily. 05/21/17   [name], MD  Insulin Pen Needle (PEN NEEDLES) 31G X 5 MM MISC 1 each by Does not apply route daily. USE TO INJECT VICTOZA DAILY AS DIRECTED 05/21/17   [name], MD  liraglutide 18 MG/3ML SOPN Inject 0.3 mLs (1.8 mg total) into the skin daily. 05/21/17   [name], MD  meloxicam (MOBIC) 7.5 MG tablet Take 1-2 tablets (7.5-15 mg total) by mouth daily. 08/09/17   Weber, Damaris Hippo, PA-C  metFORMIN (GLUCOPHAGE) 1000 MG tablet TAKE 1 TABLET BY MOUTH 2 TIMES DAILY WITH A MEAL. 05/21/17   [name], MD  montelukast (SINGULAIR) 10 MG tablet Take 1 tablet (10 mg total) by mouth at bedtime. 05/21/17   [name], MD  Multiple Vitamin (MULTIVITAMIN) capsule Take 1 capsule by mouth daily.    [provider]  nitroGLYCERIN (NITROSTAT) 0.4 MG SL tablet Place 1 tablet (0.4 mg total) under the tongue every 5 (five) minutes as needed for chest pain. 10/30/16   [name], MD  pantoprazole (PROTONIX) 40 MG tablet TAKE 1 TABLET BY MOUTH 2 TIMES DAILY. 05/21/17   [name], MD  rOPINIRole (REQUIP) 1 MG tablet Take 1 tablet (1 mg total) by mouth at bedtime. 05/21/17   [name], MD  sertraline (ZOLOFT) 100 MG tablet Take 1.5 tablets (150 mg total) by mouth daily. 05/21/17   [name], MD  TRUEPLUS LANCETS 30G MISC USE TO CHECK BLOOD GLUCOSE DAILY AS DIRECTED 05/21/17   [name], MD  Vitamin D, Ergocalciferol, (DRISDOL) 50000 units CAPS capsule Take 1 capsule (50,000 Units total) by mouth every 7 (seven) days. 08/09/17   Gale Journey, Damaris Hippo, PA-C    Family History Family History  Problem Relation Age of Onset  . Hypertension Mother 38  . Osteoarthritis Mother   . Diabetes Mother   . Colon cancer Mother 25  . Cancer Mother 64        colon cancer  . Hyperlipidemia Mother   . Obesity Mother   . Sudden death Mother   . Arthritis Mother   . Colon cancer Paternal Grandmother   . Cancer Paternal Grandmother        colon cancer  . Stroke Maternal Grandmother   . Colon cancer Maternal Grandmother   . Colon polyps Brother        x 2 brother  . Diabetes Daughter   . Hypertension Daughter   . Sleep apnea Daughter   . Mental illness Daughter        depression  . Migraines Daughter   . Cirrhosis Brother        alcoholism  . Alcohol abuse Brother   . Migraines Daughter   . Mental illness Daughter        anxiety attacks  . Protein S deficiency Daughter   . Sleep apnea Brother   . Hyperlipidemia Brother   . Migraines Brother   . Obstructive Sleep Apnea Brother   . Colon cancer Paternal Aunt     Social History Social History   Tobacco Use  . Smoking status: Former Research scientist (life sciences)  . Smokeless tobacco: Never Used  . Tobacco comment: smoked occasionally longest 6 mos.  Substance Use Topics  . Alcohol use: No    Alcohol/week: 0.6 oz    Types: 1 Glasses of wine per week  . Drug use: No     Allergies   Fish oil   Review of Systems Review of Systems  Constitutional: Negative for activity change and appetite change.  HENT: Negative for congestion, sinus pressure and sinus pain.   Respiratory: Negative for cough, chest tightness and shortness of breath.   Cardiovascular: Negative for chest pain.  Gastrointestinal: Negative for abdominal pain, nausea and vomiting.  Genitourinary: Negative for dysuria, hematuria, vaginal bleeding and vaginal discharge.  Musculoskeletal: Positive for arthralgias and myalgias.  Skin: Negative for rash.  Neurological: Negative for dizziness, weakness, light-headedness and headaches.    all other systems are negative except as noted in the HPI and PMH.    Physical Exam Updated Vital Signs BP (!) 153/89 (BP Location: Right Arm)   Pulse (!) 104   Temp 99.1 F (37.3 C) (Oral)    Resp 18   Ht 5\' 1"  (1.549 m)   Wt 104.3 kg (230 lb)   SpO2 97%   BMI 43.46 kg/m   Physical Exam  Constitutional: She is oriented to person, place, and time. She appears well-developed and well-nourished. No distress.  HENT:  Head: Normocephalic and atraumatic.  Mouth/Throat: Oropharynx is clear and moist. No oropharyngeal exudate.  Eyes: Conjunctivae and EOM are normal. Pupils are equal, round, and reactive to light.  Neck: Normal range of motion. Neck supple.  No meningismus.  Cardiovascular: Normal rate, regular rhythm, normal heart sounds and intact distal pulses.  No murmur heard. Pulmonary/Chest: Effort normal and breath sounds normal. No respiratory distress.  Abdominal: Soft. There is no tenderness. There is no rebound and no guarding.  Musculoskeletal: She exhibits tenderness and deformity. She exhibits no edema.  Tenderness to palpation of the anterior kneecap.  Unable to flex or extend due to pain.  Able to  raise leg and keep the knee extended.  Intact DP and PT pulses Compartments soft. No hip tenderness  Neurological: She is alert and oriented to person, place, and time. No cranial nerve deficit. She exhibits normal muscle tone. Coordination normal.  No ataxia on finger to nose bilaterally. No pronator drift. 5/5 strength throughout. CN 2-12 intact.Equal grip strength. Sensation intact.   Skin: Skin is warm.  Psychiatric: She has a normal mood and affect. Her behavior is normal.  Nursing note and vitals reviewed.    ED Treatments / Results  Labs (all labs ordered are listed, but only abnormal results are displayed) Labs Reviewed - No data to display  EKG  EKG Interpretation None       Radiology Ct Knee Left Wo Contrast  Result Date: 10/26/2017 CLINICAL DATA:  Ongoing pain post fall. EXAM: CT OF THE LEFT KNEE WITHOUT CONTRAST TECHNIQUE: Multidetector CT imaging of the LEFT knee was performed according to the standard protocol. Multiplanar CT image  reconstructions were also generated. COMPARISON:  Radiograph of the left knee 10/26/2017 FINDINGS: Bones/Joint/Cartilage No evidence of displaced fracture. Moderate to severe 3 compartment osteoarthritic changes with joint space narrowing, mild subchondral sclerosis and exuberant osteophytosis. Small water density suprapatellar joint effusion. Ligaments Suboptimally assessed by CT. Muscles and Tendons Intact Soft tissues No significant swelling. IMPRESSION: No evidence of fracture or dislocation of the left knee. Small water density suprapatellar joint effusion. Moderate to advanced 3 compartment osteoarthritic changes with exuberant osteophytosis. Electronically Signed   By: Fidela Salisbury M.D.   On: 10/26/2017 08:58   Dg Knee Complete 4 Views Left  Result Date: 10/26/2017 CLINICAL DATA:  Status post fall. Clinical concern for patellar fracture. EXAM: LEFT KNEE - COMPLETE 4+ VIEW COMPARISON:  None. FINDINGS: No evidence of fracture, dislocation, or joint effusion. Moderate 3 compartment osteoarthritic changes with joint space narrowing, subchondral sclerosis and osteophyte  formation. IMPRESSION: No acute fracture or dislocation identified about the left knee. Electronically Signed   By: Fidela Salisbury M.D.   On: 10/26/2017 07:12    Procedures Procedures (including critical care time)  Medications Ordered in ED Medications  HYDROcodone-acetaminophen (NORCO/VICODIN) 5-325 MG per tablet 1 tablet (not administered)     Initial Impression / Assessment and Plan / ED Course  I have reviewed the triage vital signs and the nursing notes.  Pertinent labs & imaging results that were available during my care of the patient were reviewed by me and considered in my medical decision making (see chart for details).     Fall with L knee pain. No head or neck injury. Distal pulses intact.  Xray negative for fracture. Does show arthritis.   Given severe pain and body habitus, will obtain CT to  r/o occult tibial plateau fracture. Dr. Vanita Panda to assume care at shift change.   Final Clinical Impressions(s) / ED Diagnoses   Final diagnoses:  Fall in home, initial encounter  Acute pain of left knee    ED Discharge Orders    None       Madelein Mahadeo, Annie Main, MD 10/26/17 [phone]

## 2017-10-29 ENCOUNTER — Ambulatory Visit (INDEPENDENT_AMBULATORY_CARE_PROVIDER_SITE_OTHER): Payer: 59 | Admitting: Specialist

## 2017-10-29 ENCOUNTER — Other Ambulatory Visit (INDEPENDENT_AMBULATORY_CARE_PROVIDER_SITE_OTHER): Payer: Self-pay | Admitting: Physician Assistant

## 2017-10-29 ENCOUNTER — Encounter (INDEPENDENT_AMBULATORY_CARE_PROVIDER_SITE_OTHER): Payer: Self-pay | Admitting: Specialist

## 2017-10-29 VITALS — BP 123/64 | HR 89 | Ht 61.0 in | Wt 225.0 lb

## 2017-10-29 DIAGNOSIS — S83412A Sprain of medial collateral ligament of left knee, initial encounter: Secondary | ICD-10-CM

## 2017-10-29 DIAGNOSIS — F3289 Other specified depressive episodes: Secondary | ICD-10-CM

## 2017-10-29 MED ORDER — HYDROCODONE-ACETAMINOPHEN 5-325 MG PO TABS: 1.0000 | ORAL_TABLET | Freq: Four times a day (QID) | ORAL | 0 refills | Status: DC | PRN

## 2017-10-29 MED FILL — HYDROCODON-APAP 5-325: 5-325 | 8 days supply | Qty: 30 | Fill #0

## 2017-10-29 NOTE — Progress Notes (Signed)
Office Visit Note   Patient: Cristina [name]           Date of Birth: 10/30/1961           MRN: [id number] Visit Date: 10/29/2017              Requested by: [name], MD [address] [place], [place] [address] PCP: [name], MD   Assessment & Plan: Visit Diagnoses:  1. Sprain of medial collateral ligament of left knee, initial encounter     Plan: The main ways of treat osteoarthritis, that are found to be success. Weight loss helps to decrease pain. Exercise is important to maintaining cartilage and thickness and strengthening. NSAIDs like motrin, tylenol, alleve are meds decreasing the inflamation. Ice the left knee inner area for 30-45 minutes on then 30 minutes off. You can bear as much weight as you can tolerate on the left leg. Use crutches to ambulate. Return in 2 weeks for follow up. Use the knee immobilizer when standing and walking.   Follow-Up Instructions: Return in about 2 weeks (around 11/12/2017).   Orders:  No orders of the defined types were placed in this encounter.  Meds ordered this encounter  Medications  . HYDROcodone-acetaminophen (NORCO/VICODIN) 5-325 MG tablet    Sig: Take 1 tablet by mouth every 6 (six) hours as needed for moderate pain.    Dispense:  30 tablet    Refill:  0      Procedures: No procedures performed   Clinical Data: No additional findings.   Subjective: Chief Complaint  Patient presents with  . Left Knee - Injury, Pain    S/p fall on 10/25/2017--went to the ER     56 year old female with history of lumbar spondylolisthesis and spinal stenosis, she works at [employer] and we have treated her back conservatively. She reports a fall this past weekend 10/25/2017 and landed on the left knee. She is experiencing pain with weight bearing on the left knee and with bending. There is posterior knee pain with straightening the left knee. No history of previous left knee injury, does have history of previous  patellofemoral pain. Has injured the right knee in the past. No numbness or tingling. No bowel or bladder difficulties. She does notice the pain in the back and both hips is worsening with the left leg injuring. Has night pain and sharp shooting pain in the medial left knee.    Review of Systems  Constitutional: Positive for activity change. Negative for appetite change, chills, diaphoresis, fatigue and fever.  HENT: Negative.  Negative for congestion, dental problem, ear discharge, ear pain, facial swelling, hearing loss, sinus pressure, sinus pain and sneezing.   Eyes: Negative.  Negative for photophobia, pain, discharge, redness, itching and visual disturbance.  Respiratory: Negative.  Negative for apnea, cough, chest tightness, shortness of breath, wheezing and stridor.   Cardiovascular: Positive for leg swelling. Negative for chest pain and palpitations.  Gastrointestinal: Negative.  Negative for abdominal distention.  Endocrine: Negative.   Genitourinary: Negative.  Negative for difficulty urinating, dyspareunia, dysuria, enuresis and flank pain.  Musculoskeletal: Positive for arthralgias, back pain, gait problem and joint swelling.  Skin: Negative.  Negative for color change, rash and wound.  Allergic/Immunologic: Negative.  Negative for environmental allergies and food allergies.  Neurological: Negative for dizziness, seizures, syncope, facial asymmetry, speech difficulty, weakness, light-headedness, numbness and headaches.  Hematological: Negative.  Negative for adenopathy. Does not bruise/bleed easily.  Psychiatric/Behavioral: Negative.  Negative for  agitation, behavioral problems, confusion, decreased concentration, dysphoric mood, hallucinations, self-injury, sleep disturbance and suicidal ideas. The patient is not nervous/anxious and is not hyperactive.      Objective: Vital Signs: BP 123/64 (BP Location: Left Arm, Patient Position: Sitting)   Pulse 89   Ht 5' 1"  (1.549 m)   Wt  225 lb (102.1 kg)   BMI 42.51 kg/m   Physical Exam  Constitutional: She is oriented to person, place, and time. She appears well-developed and well-nourished.  HENT:  Head: Normocephalic and atraumatic.  Eyes: EOM are normal. Pupils are equal, round, and reactive to light.  Neck: Normal range of motion. Neck supple.  Pulmonary/Chest: Effort normal and breath sounds normal.  Abdominal: Soft. Bowel sounds are normal.  Neurological: She is alert and oriented to person, place, and time.  Skin: Skin is warm and dry.  Psychiatric: She has a normal mood and affect. Her behavior is normal. Judgment and thought content normal.    Right Knee Exam  Right knee exam is normal.  Muscle Strength  The patient has normal right knee strength.  Range of Motion  Extension: normal  Flexion: 120   Tests  Valgus: positive Lachman:  Anterior - negative    Posterior - negative Drawer:  Anterior - negative    Posterior - negative  Other  Erythema: absent Scars: absent Sensation: normal Pulse: present Swelling: moderate   Left Knee Exam   Muscle Strength  The patient has normal left knee strength.  Tenderness  The patient is experiencing tenderness in the MCL and patella.  Range of Motion  Extension: abnormal  Flexion: abnormal       Specialty Comments:  No specialty comments available.  Imaging: No results found.   PMFS History: Patient Active Problem List   Diagnosis Date Noted  . Type 2 diabetes mellitus without complication, without long-term current use of insulin ([place]) 05/22/2017  . Class 3 obesity with serious comorbidity and body mass index (BMI) of 40.0 to 44.9 in adult 05/21/2017  . Family history of colon cancer 04/01/2017  . Irritable bowel syndrome with diarrhea 04/01/2017  . NSAID long-term use 04/01/2017  . Lymphocytosis 02/24/2017  . Essential hypertension 02/12/2017  . Mild intermittent asthma with acute exacerbation 12/12/2016  . Left shoulder pain  06/02/2015  . Insomnia 05/12/2014  . Fibromyalgia 05/12/2014  . Allergic rhinitis 03/28/2014  . Osteoarthritis 12/20/2013  . Cervical strain 06/21/2013  . Low back pain 06/21/2013  . Metatarsalgia of both feet 03/11/2013  . Sciatica 01/25/2013  . Restless leg syndrome 11/24/2012  . Pure hypercholesterolemia 10/20/2012  . OSA on CPAP 10/20/2012  . Depression 10/20/2012  . Iron deficiency anemia, unspecified 10/01/2012  . Diabetes mellitus, type 2 ([place]) 12/31/2010  . ANAL FISSURE 12/31/2010  . Morbid obesity ([place]) 01/08/2007  . CONSTIPATION 01/08/2007   Past Medical History:  Diagnosis Date  . Allergic rhinitis, cause unspecified   . Anemia   . Arthritis   . Asthma   . Back pain   . Chest pain   . Constipation   . CTS (carpal tunnel syndrome)   . Cystocele   . Depression   . Diabetes mellitus   . Dysfunction of eustachian tube   . Fatty liver   . Fissure, anal   . Gallbladder problem   . Genital herpes   . GERD (gastroesophageal reflux disease)   . Hemorrhoid   . Hx of migraine headaches   . Hyperlipidemia   . Hypertension   . IBS (irritable  bowel syndrome)   . Insomnia   . Iron deficiency anemia, unspecified   . Joint pain   . Nausea   . Obesity   . OSA on CPAP   . Osteoarthritis   . Rectocele   . Sleep apnea   . TMJ (dislocation of temporomandibular joint)     Family History  Problem Relation Age of Onset  . Hypertension Mother 59  . Osteoarthritis Mother   . Diabetes Mother   . Colon cancer Mother 72  . Cancer Mother 77       colon cancer  . Hyperlipidemia Mother   . Obesity Mother   . Sudden death Mother   . Arthritis Mother   . Colon cancer Paternal Grandmother   . Cancer Paternal Grandmother        colon cancer  . Stroke Maternal Grandmother   . Colon cancer Maternal Grandmother   . Colon polyps Brother        x 2 brother  . Diabetes Daughter   . Hypertension Daughter   . Sleep apnea Daughter   . Mental illness Daughter         depression  . Migraines Daughter   . Cirrhosis Brother        alcoholism  . Alcohol abuse Brother   . Migraines Daughter   . Mental illness Daughter        anxiety attacks  . Protein S deficiency Daughter   . Sleep apnea Brother   . Hyperlipidemia Brother   . Migraines Brother   . Obstructive Sleep Apnea Brother   . Colon cancer Paternal Aunt     Past Surgical History:  Procedure Laterality Date  . ABDOMINAL HYSTERECTOMY  11/11/2010   Marvel Plan.  Ovaries intact.  Uterine fibroids with DUB.  [name] CARPAL TUNNEL RELEASE  1989   Left  . CHOLECYSTECTOMY    . CHOLECYSTECTOMY    . COLONOSCOPY  06/29/2012   normal.  Repeat in 5 years.  [name] [place]  . ESOPHAGOGASTRODUODENOSCOPY  12/13/2011   dysphagia.  Henrene Pastor.  Normal.  . Sleep study  09/11/2012   severe OSA.  CPAP titration at 12 cm water pressure.  . TUBAL LIGATION  1988   Social History   Occupational History  . Occupation: Audiological scientist: [employer]    Comment: [employer]  Tobacco Use  . Smoking status: Former Research scientist (life sciences)  . Smokeless tobacco: Never Used  . Tobacco comment: smoked occasionally longest 6 mos.  Substance and Sexual Activity  . Alcohol use: No    Alcohol/week: 0.6 oz    Types: 1 Glasses of wine per week  . Drug use: No  . Sexual activity: No    Birth control/protection: None

## 2017-10-29 NOTE — Patient Instructions (Signed)
The main ways of treat osteoarthritis, that are found to be success. Weight loss helps to decrease pain. Exercise is important to maintaining cartilage and thickness and strengthening. NSAIDs like motrin, tylenol, alleve are meds decreasing the inflamation. Ice the left knee inner area for 30-45 minutes on then 30 minutes off. You can bear as much weight as you can tolerate on the left leg. Use crutches to ambulate. Return in 2 weeks for follow up. Use the knee immobilizer when standing and walking.

## 2017-10-31 ENCOUNTER — Telehealth: Payer: Self-pay | Admitting: Family Medicine

## 2017-10-31 DIAGNOSIS — F3289 Other specified depressive episodes: Secondary | ICD-10-CM

## 2017-10-31 NOTE — Telephone Encounter (Signed)
Copied from [place] [id number]. Topic: General - Other >> Oct 31, 2017 11:34 AM Darl Householder, RMA wrote: Reason for CRM: medication refill request for Wellbutrin 200 mg to be sent to [facility]

## 2017-10-31 NOTE — Telephone Encounter (Signed)
Can pt have refill of Wellbutrin? Original refill request refused on 10/29/17, due to pt needing to be seen for appt. Appt not scheduled until 12/31/17

## 2017-11-03 NOTE — Telephone Encounter (Signed)
Dr. Tamala Julian, Dr. Leafy Ro office prescribed it last to patient.  She states she has been out for 2 weeks.  She states they will not fill because she is due for an appointment and she states she will not be going back there.   Patient has an appointment with you 2/20 and wanted to know if you would fill

## 2017-11-07 MED ORDER — BUPROPION HCL ER (SR) 200 MG PO TB12: 200.0000 mg | ORAL_TABLET | Freq: Every day | ORAL | 0 refills | Status: DC

## 2017-11-07 MED FILL — BUPROPION HCL SR 200 MG TAB: 200 | 90 days supply | Qty: 90 | Fill #0

## 2017-11-12 ENCOUNTER — Ambulatory Visit (INDEPENDENT_AMBULATORY_CARE_PROVIDER_SITE_OTHER): Payer: 59 | Admitting: Specialist

## 2017-11-12 ENCOUNTER — Encounter (INDEPENDENT_AMBULATORY_CARE_PROVIDER_SITE_OTHER): Payer: Self-pay | Admitting: Specialist

## 2017-11-12 VITALS — BP 125/80 | HR 115 | Ht 61.0 in | Wt 225.0 lb

## 2017-11-12 DIAGNOSIS — E559 Vitamin D deficiency, unspecified: Secondary | ICD-10-CM | POA: Diagnosis not present

## 2017-11-12 DIAGNOSIS — M4316 Spondylolisthesis, lumbar region: Secondary | ICD-10-CM | POA: Diagnosis not present

## 2017-11-12 DIAGNOSIS — M1712 Unilateral primary osteoarthritis, left knee: Secondary | ICD-10-CM | POA: Diagnosis not present

## 2017-11-12 DIAGNOSIS — S83412D Sprain of medial collateral ligament of left knee, subsequent encounter: Secondary | ICD-10-CM | POA: Diagnosis not present

## 2017-11-12 DIAGNOSIS — M48062 Spinal stenosis, lumbar region with neurogenic claudication: Secondary | ICD-10-CM

## 2017-11-12 MED ORDER — HYDROCODONE-ACETAMINOPHEN 5-325 MG PO TABS: 1.0000 | ORAL_TABLET | Freq: Four times a day (QID) | ORAL | 0 refills | Status: DC | PRN

## 2017-11-12 MED ORDER — NAPROXEN 500 MG PO TABS: 500.0000 mg | ORAL_TABLET | Freq: Two times a day (BID) | ORAL | 0 refills | Status: DC

## 2017-11-12 MED ORDER — VITAMIN D (ERGOCALCIFEROL) 1.25 MG (50000 UNIT) PO CAPS: 50000.0000 [IU] | ORAL_CAPSULE | ORAL | 0 refills | Status: DC

## 2017-11-12 MED FILL — HYDROCODON-APAP 5-325: 5-325 | 8 days supply | Qty: 30 | Fill #0

## 2017-11-12 MED FILL — VIT D2 1.25 MG (50,000 UNIT: 1.25 MG | 84 days supply | Qty: 12 | Fill #0

## 2017-11-12 MED FILL — NAPROXEN 500 MG TABLET: 500 | 15 days supply | Qty: 30 | Fill #0

## 2017-11-12 MED FILL — ATORVASTATIN 20 MG TABLET: 20 | 90 days supply | Qty: 90 | Fill #2

## 2017-11-12 NOTE — Patient Instructions (Addendum)
  Knee is suffering from osteoarthritis, only real proven treatments are Weight loss, NSIADs like naproxen and exercise. Well padded shoes help. Ice the knee 2-3 times a day 15-20 mins at a time. Go to PT for exercises of the left knee for MCL sprain and for OA and for lumbar spinal stenosis and spondylolisthesis

## 2017-11-12 NOTE — Progress Notes (Signed)
Office Visit Note   Patient: Cristina [name]           Date of Birth: Aug 24, 1961           MRN: [id number] Visit Date: 11/12/2017              Requested by: [name], MD [id number] [place], [place] [address] PCP: [name], MD   Assessment & Plan: Visit Diagnoses:  1. Tear of MCL (medial collateral ligament) of knee, left, subsequent encounter   2. Unilateral primary osteoarthritis, left knee   3. Spinal stenosis of lumbar region with neurogenic claudication   4. Spondylolisthesis, lumbar region     Plan: Knee is suffering from osteoarthritis, only real proven treatments are Weight loss, NSIADs like naproxen and exercise. Well padded shoes help. Ice the knee 2-3 times a day 15-20 mins at a time. Go to PT for exercises of the left knee for MCL sprain and for OA and for lumbar spinal stenosis and spondylolisthesis  Follow-Up Instructions: No Follow-up on file.   Orders:  No orders of the defined types were placed in this encounter.  No orders of the defined types were placed in this encounter.     Procedures: No procedures performed   Clinical Data: Findings:  CT Scan left knee with tricompartment OA, no fracture.      Subjective: Chief Complaint  Patient presents with  . Left Knee - Follow-up    57 year old female fell about 3 weeks ago and injured the left knee, she has pain with standing and weight bearing on the left leg.    Review of Systems  Constitutional: Negative.   HENT: Negative.   Eyes: Negative.   Respiratory: Negative.   Cardiovascular: Negative.   Gastrointestinal: Negative.   Endocrine: Negative.   Genitourinary: Negative.   Musculoskeletal: Negative.   Skin: Negative.   Allergic/Immunologic: Negative.   Neurological: Negative.   Hematological: Negative.   Psychiatric/Behavioral: Negative.      Objective: Vital Signs: BP 125/80 (BP Location: Left Arm, Patient Position: Sitting)   Pulse (!) 115   Ht 5' 1"  (1.549  m)   Wt 225 lb (102.1 kg)   BMI 42.51 kg/m   Physical Exam  Constitutional: She is oriented to person, place, and time. She appears well-developed and well-nourished.  HENT:  Head: Normocephalic and atraumatic.  Eyes: EOM are normal. Pupils are equal, round, and reactive to light.  Neck: Normal range of motion. Neck supple.  Pulmonary/Chest: Effort normal and breath sounds normal.  Abdominal: Soft. Bowel sounds are normal.  Neurological: She is alert and oriented to person, place, and time.  Skin: Skin is warm and dry.  Psychiatric: She has a normal mood and affect. Her behavior is normal. Judgment and thought content normal.    Back Exam   Tenderness  The patient is experiencing tenderness in the lumbar.  Range of Motion  Extension: abnormal  Flexion: normal  Lateral bend right: abnormal  Lateral bend left: abnormal  Rotation right: abnormal  Rotation left: abnormal   Muscle Strength  Right Quadriceps:  5/5  Left Quadriceps:  5/5  Right Hamstrings:  5/5  Left Hamstrings:  5/5   Reflexes  Patellar: normal Achilles: normal Biceps: normal Babinski's sign: normal   Other  Toe walk: normal Heel walk: normal Sensation: normal Gait: antalgic  Erythema: no back redness Scars: absent  Comments:  Valgus stress of the MCL is painful on the left side, Positive effusion.  Specialty Comments:  No specialty comments available.  Imaging: No results found.   PMFS History: Patient Active Problem List   Diagnosis Date Noted  . Type 2 diabetes mellitus without complication, without long-term current use of insulin ([place]) 05/22/2017  . Class 3 obesity with serious comorbidity and body mass index (BMI) of 40.0 to 44.9 in adult 05/21/2017  . Family history of colon cancer 04/01/2017  . Irritable bowel syndrome with diarrhea 04/01/2017  . NSAID long-term use 04/01/2017  . Lymphocytosis 02/24/2017  . Essential hypertension 02/12/2017  . Mild intermittent asthma  with acute exacerbation 12/12/2016  . Left shoulder pain 06/02/2015  . Insomnia 05/12/2014  . Fibromyalgia 05/12/2014  . Allergic rhinitis 03/28/2014  . Osteoarthritis 12/20/2013  . Cervical strain 06/21/2013  . Low back pain 06/21/2013  . Metatarsalgia of both feet 03/11/2013  . Sciatica 01/25/2013  . Restless leg syndrome 11/24/2012  . Pure hypercholesterolemia 10/20/2012  . OSA on CPAP 10/20/2012  . Depression 10/20/2012  . Iron deficiency anemia, unspecified 10/01/2012  . Diabetes mellitus, type 2 ([place]) 12/31/2010  . ANAL FISSURE 12/31/2010  . Morbid obesity ([place]) 01/08/2007  . CONSTIPATION 01/08/2007   Past Medical History:  Diagnosis Date  . Allergic rhinitis, cause unspecified   . Anemia   . Arthritis   . Asthma   . Back pain   . Chest pain   . Constipation   . CTS (carpal tunnel syndrome)   . Cystocele   . Depression   . Diabetes mellitus   . Dysfunction of eustachian tube   . Fatty liver   . Fissure, anal   . Gallbladder problem   . Genital herpes   . GERD (gastroesophageal reflux disease)   . Hemorrhoid   . Hx of migraine headaches   . Hyperlipidemia   . Hypertension   . IBS (irritable bowel syndrome)   . Insomnia   . Iron deficiency anemia, unspecified   . Joint pain   . Nausea   . Obesity   . OSA on CPAP   . Osteoarthritis   . Rectocele   . Sleep apnea   . TMJ (dislocation of temporomandibular joint)     Family History  Problem Relation Age of Onset  . Hypertension Mother 48  . Osteoarthritis Mother   . Diabetes Mother   . Colon cancer Mother 72  . Cancer Mother 74       colon cancer  . Hyperlipidemia Mother   . Obesity Mother   . Sudden death Mother   . Arthritis Mother   . Colon cancer Paternal Grandmother   . Cancer Paternal Grandmother        colon cancer  . Stroke Maternal Grandmother   . Colon cancer Maternal Grandmother   . Colon polyps Brother        x 2 brother  . Diabetes Daughter   . Hypertension Daughter   . Sleep  apnea Daughter   . Mental illness Daughter        depression  . Migraines Daughter   . Cirrhosis Brother        alcoholism  . Alcohol abuse Brother   . Migraines Daughter   . Mental illness Daughter        anxiety attacks  . Protein S deficiency Daughter   . Sleep apnea Brother   . Hyperlipidemia Brother   . Migraines Brother   . Obstructive Sleep Apnea Brother   . Colon cancer Paternal Aunt     Past Surgical History:  Procedure Laterality Date  . ABDOMINAL HYSTERECTOMY  11/11/2010   [place] Plan.  Ovaries intact.  Uterine fibroids with DUB.  [name] CARPAL TUNNEL RELEASE  1989   Left  . CHOLECYSTECTOMY    . CHOLECYSTECTOMY    . COLONOSCOPY  06/29/2012   normal.  Repeat in 5 years.  [name] [place]  . ESOPHAGOGASTRODUODENOSCOPY  12/13/2011   dysphagia.  Henrene Pastor.  Normal.  . Sleep study  09/11/2012   severe OSA.  CPAP titration at 12 cm water pressure.  . TUBAL LIGATION  1988   Social History   Occupational History  . Occupation: Audiological scientist: [employer]    Comment: [employer]  Tobacco Use  . Smoking status: Former Research scientist (life sciences)  . Smokeless tobacco: Never Used  . Tobacco comment: smoked occasionally longest 6 mos.  Substance and Sexual Activity  . Alcohol use: No    Alcohol/week: 0.6 oz    Types: 1 Glasses of wine per week  . Drug use: No  . Sexual activity: No    Birth control/protection: None

## 2017-11-18 DIAGNOSIS — G4733 Obstructive sleep apnea (adult) (pediatric): Secondary | ICD-10-CM | POA: Diagnosis not present

## 2017-12-02 ENCOUNTER — Ambulatory Visit: Payer: 59 | Admitting: Family Medicine

## 2017-12-04 ENCOUNTER — Ambulatory Visit: Payer: 59 | Admitting: Physical Therapy

## 2017-12-04 ENCOUNTER — Other Ambulatory Visit: Payer: Self-pay

## 2017-12-04 ENCOUNTER — Ambulatory Visit: Payer: 59 | Attending: Specialist

## 2017-12-04 DIAGNOSIS — M545 Low back pain: Secondary | ICD-10-CM | POA: Diagnosis not present

## 2017-12-04 DIAGNOSIS — R293 Abnormal posture: Secondary | ICD-10-CM | POA: Diagnosis not present

## 2017-12-04 DIAGNOSIS — M62830 Muscle spasm of back: Secondary | ICD-10-CM | POA: Diagnosis not present

## 2017-12-04 DIAGNOSIS — R600 Localized edema: Secondary | ICD-10-CM | POA: Insufficient documentation

## 2017-12-04 DIAGNOSIS — M6281 Muscle weakness (generalized): Secondary | ICD-10-CM | POA: Insufficient documentation

## 2017-12-04 DIAGNOSIS — G8929 Other chronic pain: Secondary | ICD-10-CM | POA: Diagnosis not present

## 2017-12-04 DIAGNOSIS — M25562 Pain in left knee: Secondary | ICD-10-CM | POA: Diagnosis not present

## 2017-12-04 NOTE — Patient Instructions (Signed)
Bridge    Lying on back, legs bent 90, feet flat on floor. Press up hips and torso, reaching hands to feet. Hold for ____ breaths. ADVANCED: Clasp hands underneath back and squeeze shoulder blades together, lifting upper body onto outside of shoulders.  Copyright  VHI. All rights reserved.   DON'T GO AS HIGH AS THE PICTURE AND FLATTEN BACK TO START Straight Leg Raise    Tighten stomach and slowly raise locked right leg ____ inches from floor. Repeat __10-20__ times per set. Do ___1-3_ sets per session. Do ___1-2Knee-to-Chest Stretch: Unilateral    With hand behind right knee, pull knee in to chest until a comfortable stretch is felt in lower back and buttocks. Keep back relaxed. Hold 30Hip Flexor Stretch  h in contact with bed.  http://gt2.exer.us/347   Copyright  VHI. All rights reserved.  ____ seconds. Repeat ___2-3_ times per set. Do    1-2-____ sets per session. Do __2__ sessions per day.  http://orth.exer.us/127   Copyright  VHI. All rights reserved.  _ sessions per day.  http://orth.exer.us/1103   Copyright  VHI. All rights reserved.  SUPINE: Partial Sit-Up    Tighten abdominal muscles, raise head and shoulders off surface. Keep eyes on ceiling to avoid neck strain. _10__ reps per set, __1-2_ sets per day, _5__ days per week HOLD 5-10 SEC  Copyright  VHI. All rights reserved.

## 2017-12-04 NOTE — Therapy (Signed)
[place], [place], [address] Phone: [phone]   Fax:  [phone]  Physical Therapy Evaluation  Patient Details  Name: Cristina [name] MRN: [id number] Date of Birth: 12/10/1960 Referring Provider: Basil Dess, MD   Encounter Date: 12/04/2017  PT End of Session - 12/04/17 0743    Visit Number  1    Number of Visits  12    Date for PT Re-Evaluation  01/16/18    PT Start Time  0703    PT Stop Time  0750    PT Time Calculation (min)  47 min    Activity Tolerance  Patient tolerated treatment well;No increased pain    Behavior During Therapy  WFL for tasks assessed/performed       Past Medical History:  Diagnosis Date  . Allergic rhinitis, cause unspecified   . Anemia   . Arthritis   . Asthma   . Back pain   . Chest pain   . Constipation   . CTS (carpal tunnel syndrome)   . Cystocele   . Depression   . Diabetes mellitus   . Dysfunction of eustachian tube   . Fatty liver   . Fissure, anal   . Gallbladder problem   . Genital herpes   . GERD (gastroesophageal reflux disease)   . Hemorrhoid   . Hx of migraine headaches   . Hyperlipidemia   . Hypertension   . IBS (irritable bowel syndrome)   . Insomnia   . Iron deficiency anemia, unspecified   . Joint pain   . Nausea   . Obesity   . OSA on CPAP   . Osteoarthritis   . Rectocele   . Sleep apnea   . TMJ (dislocation of temporomandibular joint)     Past Surgical History:  Procedure Laterality Date  . ABDOMINAL HYSTERECTOMY  11/11/2010   [place] Plan.  Ovaries intact.  Uterine fibroids with DUB.  [name] CARPAL TUNNEL RELEASE  1989   Left  . CHOLECYSTECTOMY    . CHOLECYSTECTOMY    . COLONOSCOPY  06/29/2012   normal.  Repeat in 5 years.  [name] [place]  . ESOPHAGOGASTRODUODENOSCOPY  12/13/2011   dysphagia.  Henrene Pastor.  Normal.  . Sleep study  09/11/2012   severe OSA.  CPAP titration at 12 cm water pressure.  . TUBAL LIGATION  1988     There were no vitals filed for this visit.   Subjective Assessment - 12/04/17 0711    Subjective  She reports fall withn injury of LT knee and lower back.     After last PT episode last year LBP did not get better.   Ablation did not work for back pain.    After fall on water sprain  medial LT knee.      Issued pain meds and bracing.    In PT for pain and minimize stiffness.      Limitations  Walking;Standing dressing, getting up from bed or sitting.     How long can you stand comfortably?  < 10 min     How long can you walk comfortably?  100-200 feet.    ( same as prior to fall)      Diagnostic tests  CT of knee , Degenrative changes , swelling    Patient Stated Goals  Decr pain    Currently in Pain?  Yes    Pain Score  7     Pain Location  Knee  Pain Orientation  Left;Medial    Pain Descriptors / Indicators  Sore    Pain Type  Acute pain    Pain Onset  More than a month ago    Pain Frequency  Constant    Aggravating Factors   weight bearing    Pain Relieving Factors  sitting with knee extended.   medication    Multiple Pain Sites  Yes    Pain Score  0    Pain Location  Back    Pain Orientation  Lower    Pain Descriptors / Indicators  Aching    Pain Type  Chronic pain    Pain Onset  More than a month ago    Pain Frequency  Intermittent    Aggravating Factors   standing and walking    Pain Relieving Factors  sit rest.          [facility] PT Assessment - 12/04/17 0001      Assessment   Medical Diagnosis  LT knee MCL tear and lumbar spondylolisthesis    Referring Provider  Basil Dess, MD    Onset Date/Surgical Date  10/26/18    Prior Therapy  2018 for back      Precautions   Precautions  None      Restrictions   Weight Bearing Restrictions  No      Balance Screen   Has the patient fallen in the past 6 months  Yes    How many times?  1    Has the patient had a decrease in activity level because of a fear of falling?   Yes    Is the patient reluctant to leave their  home because of a fear of falling?   No      Prior Function   Level of Independence  Independent      Cognition   Overall Cognitive Status  Within Functional Limits for tasks assessed      Observation/Other Assessments-Edema    Edema  -- 40 cm bilaterally but fuller medial LT knee compared to RT.       Posture/Postural Control   Posture Comments  LT knee flexed , rounded shoulders, sway back      ROM / Strength   AROM / PROM / Strength  AROM;Strength      AROM   AROM Assessment Site  Lumbar;Knee    Right/Left Knee  Right;Left    Right Knee Extension  -18    Right Knee Flexion  135    Left Knee Extension  -20    Left Knee Flexion  130    Lumbar Flexion  70    Lumbar Extension  20    Lumbar - Right Side Bend  20    Lumbar - Left Side Bend  20      Strength   Overall Strength Comments  Poor abdominals    Strength Assessment Site  Knee    Right/Left Knee  Right;Left    Right Knee Flexion  5/5    Right Knee Extension  5/5 graon /crepitus noted ontesting    Left Knee Flexion  5/5    Left Knee Extension  4/5 limited due to pain      Flexibility   Soft Tissue Assessment /Muscle Length  yes    Hamstrings  65-70 degrees bilaterally       Palpation   Palpation comment  Tender medial LT knee      Ambulation/Gait   Gait Comments  antalgic gait decr  weight LT leg             Objective measurements completed on examination: See above findings.              PT Education - 12/04/17 0742    Education provided  Yes    Education Details  poc , hep , need to ice regularly and exercise    Person(s) Educated  Patient    Methods  Explanation;Verbal cues;Handout;Tactile cues    Comprehension  Returned demonstration;Verbalized understanding       PT Short Term Goals - 12/04/17 0746      PT SHORT TERM GOAL #1   Title  She will be independent with intial HEP    Time  2    Period  Weeks    Status  New      PT SHORT TERM GOAL #2   Title  she will report decr  pain and be  able to stand 10 min or more before inc pain    Time  3    Period  Weeks    Status  New      PT SHORT TERM GOAL #3   Title  she will report pain decr so she is able to walk 10 min or more before incr pain. in knee or back    Time  3    Period  Weeks    Status  New        PT Long Term Goals - 12/04/17 0747      PT LONG TERM GOAL #1   Title  she will be independent with all hEP issued     Time  6    Period  Weeks    Status  New      PT LONG TERM GOAL #2   Title  She willbe able to walk for > 20 min due to decr pain in knee and back    Time  6    Period  Weeks    Status  New      PT LONG TERM GOAL #3   Title  she will report pain as intermittant in knee and back    Period  Weeks    Status  New      PT LONG TERM GOAL #4   Title  She will be able to sit for 30 min before incr  knee  pain.     Time  6    Period  Weeks    Status  New             Plan - 12/04/17 0749    History and Personal Factors relevant to plan of care:  chronic back pain , OA knees and back    Clinical Presentation  Evolving    Clinical Presentation due to:  recent fall with new knee pain and cont back pain    Clinical Decision Making  Moderate    Rehab Potential  Good    PT Frequency  2x / week    PT Duration  6 weeks    PT Treatment/Interventions  Iontophoresis 4mg /ml Dexamethasone;Cryotherapy;Therapeutic exercise;Patient/family education;Manual techniques;Dry needling;Vasopneumatic Device;Taping    PT Next Visit Plan  review HEP and progress , back primarily flexion based, modalities , ionto if MD signs note, tape for swelling medial LT knee    PT Home Exercise Plan  SLR, bridge, part situp, knee to chest stretch    Consulted and Agree with Plan of Care  Patient  Patient will benefit from skilled therapeutic intervention in order to improve the following deficits and impairments:  Pain, Decreased activity tolerance, Decreased range of motion, Decreased strength,  Difficulty walking, Increased edema, Increased muscle spasms, Postural dysfunction  Visit Diagnosis: Chronic left-sided low back pain, with sciatica presence unspecified  Abnormal posture  Muscle spasm of back  Weakness of trunk musculature  Acute pain of left knee  Localized edema     Problem List Patient Active Problem List   Diagnosis Date Noted  . Type 2 diabetes mellitus without complication, without long-term current use of insulin ([place]) 05/22/2017  . Class 3 obesity with serious comorbidity and body mass index (BMI) of 40.0 to 44.9 in adult 05/21/2017  . Family history of colon cancer 04/01/2017  . Irritable bowel syndrome with diarrhea 04/01/2017  . NSAID long-term use 04/01/2017  . Lymphocytosis 02/24/2017  . Essential hypertension 02/12/2017  . Mild intermittent asthma with acute exacerbation 12/12/2016  . Left shoulder pain 06/02/2015  . Insomnia 05/12/2014  . Fibromyalgia 05/12/2014  . Allergic rhinitis 03/28/2014  . Osteoarthritis 12/20/2013  . Cervical strain 06/21/2013  . Low back pain 06/21/2013  . Metatarsalgia of both feet 03/11/2013  . Sciatica 01/25/2013  . Restless leg syndrome 11/24/2012  . Pure hypercholesterolemia 10/20/2012  . OSA on CPAP 10/20/2012  . Depression 10/20/2012  . Iron deficiency anemia, unspecified 10/01/2012  . Diabetes mellitus, type 2 ([place]) 12/31/2010  . ANAL FISSURE 12/31/2010  . Morbid obesity ([place]) 01/08/2007  . CONSTIPATION 01/08/2007    Darrel Hoover  PT 12/04/2017, 7:54 AM  [facility] [address] [place], [place], [address] Phone: [phone]   Fax:  [phone]  Name: Cristina [name] MRN: [id number] Date of Birth: 1961/04/27

## 2017-12-08 DIAGNOSIS — E119 Type 2 diabetes mellitus without complications: Secondary | ICD-10-CM | POA: Diagnosis not present

## 2017-12-08 DIAGNOSIS — H2513 Age-related nuclear cataract, bilateral: Secondary | ICD-10-CM | POA: Diagnosis not present

## 2017-12-08 LAB — HM DIABETES EYE EXAM

## 2017-12-10 MED FILL — INVOKANA 300 MG TABLET: 300 | 90 days supply | Qty: 90 | Fill #2

## 2017-12-11 ENCOUNTER — Ambulatory Visit (INDEPENDENT_AMBULATORY_CARE_PROVIDER_SITE_OTHER): Payer: 59 | Admitting: Specialist

## 2017-12-15 ENCOUNTER — Ambulatory Visit: Payer: 59 | Attending: Specialist

## 2017-12-15 DIAGNOSIS — M62830 Muscle spasm of back: Secondary | ICD-10-CM | POA: Diagnosis not present

## 2017-12-15 DIAGNOSIS — R293 Abnormal posture: Secondary | ICD-10-CM | POA: Diagnosis not present

## 2017-12-15 DIAGNOSIS — M25562 Pain in left knee: Secondary | ICD-10-CM | POA: Insufficient documentation

## 2017-12-15 DIAGNOSIS — G8929 Other chronic pain: Secondary | ICD-10-CM | POA: Diagnosis not present

## 2017-12-15 DIAGNOSIS — M6281 Muscle weakness (generalized): Secondary | ICD-10-CM | POA: Diagnosis not present

## 2017-12-15 DIAGNOSIS — M545 Low back pain: Secondary | ICD-10-CM | POA: Diagnosis not present

## 2017-12-15 MED FILL — SERTRALINE HCL 100 MG TAB: 100 | 90 days supply | Qty: 135 | Fill #2

## 2017-12-15 MED FILL — rOPINIRole HCL 1 MG TABS: 1 | 90 days supply | Qty: 90 | Fill #2

## 2017-12-15 NOTE — Therapy (Addendum)
[place], [place], [address] Phone: [phone]   Fax:  [phone]  Physical Therapy Treatment/Discharge  Patient Details  Name: Cristina [name] MRN: [id number] Date of Birth: April 13, 1961 Referring Provider: Basil Dess, MD   Encounter Date: 12/15/2017  PT End of Session - 12/15/17 1427    Visit Number  2    Number of Visits  12    Date for PT Re-Evaluation  01/16/18    PT Start Time  0220    PT Stop Time  0300    PT Time Calculation (min)  40 min    Activity Tolerance  Patient tolerated treatment well;No increased pain    Behavior During Therapy  WFL for tasks assessed/performed       Past Medical History:  Diagnosis Date  . Allergic rhinitis, cause unspecified   . Anemia   . Arthritis   . Asthma   . Back pain   . Chest pain   . Constipation   . CTS (carpal tunnel syndrome)   . Cystocele   . Depression   . Diabetes mellitus   . Dysfunction of eustachian tube   . Fatty liver   . Fissure, anal   . Gallbladder problem   . Genital herpes   . GERD (gastroesophageal reflux disease)   . Hemorrhoid   . Hx of migraine headaches   . Hyperlipidemia   . Hypertension   . IBS (irritable bowel syndrome)   . Insomnia   . Iron deficiency anemia, unspecified   . Joint pain   . Nausea   . Obesity   . OSA on CPAP   . Osteoarthritis   . Rectocele   . Sleep apnea   . TMJ (dislocation of temporomandibular joint)     Past Surgical History:  Procedure Laterality Date  . ABDOMINAL HYSTERECTOMY  11/11/2010   Marvel Plan.  Ovaries intact.  Uterine fibroids with DUB.  [name] CARPAL TUNNEL RELEASE  1989   Left  . CHOLECYSTECTOMY    . CHOLECYSTECTOMY    . COLONOSCOPY  06/29/2012   normal.  Repeat in 5 years.  [name] [place]  . ESOPHAGOGASTRODUODENOSCOPY  12/13/2011   dysphagia.  Henrene Pastor.  Normal.  . Sleep study  09/11/2012   severe OSA.  CPAP titration at 12 cm water pressure.  . TUBAL LIGATION   1988    There were no vitals filed for this visit.  Subjective Assessment - 12/15/17 1422    Subjective  Knee some better.   Back pain none at this time.         Currently in Pain?  No/denies    Pain Location  Knee    Pain Orientation  Left;Medial    Pain Descriptors / Indicators  Sore    Pain Type  Acute pain    Pain Onset  More than a month ago    Pain Frequency  Intermittent                      [facility] Adult PT Treatment/Exercise - 12/15/17 0001      Exercises   Exercises  Knee/Hip      Knee/Hip Exercises: Supine   Straight Leg Raises  Right;Left;Limitations    Straight Leg Raises Limitations  x12      Knee/Hip Exercises: Sidelying   Hip ABduction  Right;Left    Hip ABduction Limitations  12 rep    Clams  x12 RT/LT  Modalities   Modalities  Moist Heat      Moist Heat Therapy   Number Minutes Moist Heat  12 Minutes    Moist Heat Location  Lumbar Spine      HEP all reviewed and done correctly       PT Education - 12/15/17 1444    Education provided  Yes    Education Details  hip HEP, ionto guidlines    Person(s) Educated  Patient    Methods  Explanation;Tactile cues;Verbal cues;Handout    Comprehension  Returned demonstration;Verbalized understanding       PT Short Term Goals - 12/15/17 1425      PT SHORT TERM GOAL #1   Title  She will be independent with intial HEP    Status  Achieved        PT Long Term Goals - 12/04/17 0747      PT LONG TERM GOAL #1   Title  she will be independent with all hEP issued     Time  6    Period  Weeks    Status  New      PT LONG TERM GOAL #2   Title  She willbe able to walk for > 20 min due to decr pain in knee and back    Time  6    Period  Weeks    Status  New      PT LONG TERM GOAL #3   Title  she will report pain as intermittant in knee and back    Period  Weeks    Status  New      PT LONG TERM GOAL #4   Title  She will be able to sit for 30 min before incr  knee  pain.     Time   6    Period  Weeks    Status  New      PT LONG TERM GOAL #5   Title  FOTO improved to 50% limited or less    Time  6    Period  Weeks    Status  New            Plan - 12/15/17 1424    Clinical Impression Statement  She report feeling some better in knee and back though still standing causes increased pain. She did all exercise correctly post instruction and agreed to HEP. Back felt better post MHP after exercises. She understood ionto guides    PT Treatment/Interventions  Iontophoresis 10m/ml Dexamethasone;Cryotherapy;Therapeutic exercise;Patient/family education;Manual techniques;Dry needling;Vasopneumatic Device;Taping    PT Next Visit Plan  review HEP and progress , back primarily flexion based, modalities , ionto if MD signs note, tape for swelling medial LT knee    PT Home Exercise Plan  SLR, bridge, part situp, knee to chest stretch, hip clams and abduciton    Consulted and Agree with Plan of Care  Patient       Patient will benefit from skilled therapeutic intervention in order to improve the following deficits and impairments:  Pain, Decreased activity tolerance, Decreased range of motion, Decreased strength, Difficulty walking, Increased edema, Increased muscle spasms, Postural dysfunction  Visit Diagnosis: Chronic left-sided low back pain, with sciatica presence unspecified  Abnormal posture  Muscle spasm of back  Weakness of trunk musculature  Acute pain of left knee     Problem List Patient Active Problem List   Diagnosis Date Noted  . Type 2 diabetes mellitus without complication, without long-term current use of insulin (  [place]) 05/22/2017  . Class 3 obesity with serious comorbidity and body mass index (BMI) of 40.0 to 44.9 in adult 05/21/2017  . Family history of colon cancer 04/01/2017  . Irritable bowel syndrome with diarrhea 04/01/2017  . NSAID long-term use 04/01/2017  . Lymphocytosis 02/24/2017  . Essential hypertension 02/12/2017  . Mild  intermittent asthma with acute exacerbation 12/12/2016  . Left shoulder pain 06/02/2015  . Insomnia 05/12/2014  . Fibromyalgia 05/12/2014  . Allergic rhinitis 03/28/2014  . Osteoarthritis 12/20/2013  . Cervical strain 06/21/2013  . Low back pain 06/21/2013  . Metatarsalgia of both feet 03/11/2013  . Sciatica 01/25/2013  . Restless leg syndrome 11/24/2012  . Pure hypercholesterolemia 10/20/2012  . OSA on CPAP 10/20/2012  . Depression 10/20/2012  . Iron deficiency anemia, unspecified 10/01/2012  . Diabetes mellitus, type 2 ([place]) 12/31/2010  . ANAL FISSURE 12/31/2010  . Morbid obesity ([place]) 01/08/2007  . CONSTIPATION 01/08/2007    Darrel Hoover PT 12/15/2017, 2:54 PM  [facility] [address] [place], [place], [address] Phone: [phone]   Fax:  [phone]  Name: REMMINGTON TETERS MRN: [id number] Date of Birth: 04/27/1961  PHYSICAL THERAPY DISCHARGE SUMMARY  Visits from Start of Care: 2 Current functional level related to goals / functional outcomes: Canceled all appointments stating she did not think she was benefiting from PT though she only had 2 appointments   Remaining deficits: See above   Education / Equipment: HEP Plan: Patient agrees to discharge.  Patient goals were not met. Patient is being discharged due to the patient's request.  ?????   Pearson Forster  PT 01/15/18

## 2017-12-15 NOTE — Patient Instructions (Addendum)
Clam    Lie on side, legs bent 90. Open top knee to ceiling, rotating leg outward. Touch toes to ankle of bottom leg. Close knees, rotating leg inward. Maintain hip position. Repeat ___12-20_ times. Repeat on other side. Do _1HIP: Abduction - Side-Lying    Lie on side, legs straight and in line with trunk. Squeeze glutes. Raise top leg up and slightly back. Point toes forward. _10-20__ reps per set, __1_ sets per day, ___ days per week Bend bottom leg to stabilize pelvis.  Copyright  VHI. All rights reserved.  ___ sessions per day.  http://pm.exer.us/69   Copyright  VHI. All rights reserved.  IONTOPHORESIS PATIENT PRECAUTIONS & CONTRAINDICATIONS:  . Redness under one or both electrodes can occur.  This characterized by a uniform redness that usually disappears within 12 hours of treatment. . Small pinhead size blisters may result in response to the drug.  Contact your physician if the problem persists more than 24 hours. . On rare occasions, iontophoresis therapy can result in temporary skin reactions such as rash, inflammation, irritation or burns.  The skin reactions may be the result of individual sensitivity to the ionic solution used, the condition of the skin at the start of treatment, reaction to the materials in the electrodes, allergies or sensitivity to dexamethasone, or a poor connection between the patch and your skin.  Discontinue using iontophoresis if you have any of these reactions and report to your therapist. . Remove the Patch or electrodes if you have any undue sensation of pain or burning during the treatment and report discomfort to your therapist. . Tell your Therapist if you have had known adverse reactions to the application of electrical current. . If using the Patch, the LED light will turn off when treatment is complete and the patch can be removed.  Approximate treatment time is 1-3 hours.  Remove the patch when light goes off or after 6 hours. . The Patch  can be worn during normal activity, however excessive motion where the electrodes have been placed can cause poor contact between the skin and the electrode or uneven electrical current resulting in greater risk of skin irritation. [name] Keep out of the reach of children.   . DO NOT use if you have a cardiac pacemaker or any other electrically sensitive implanted device. . DO NOT use if you have a known sensitivity to dexamethasone. . DO NOT use during Magnetic Resonance Imaging (MRI). . DO NOT use over broken or compromised skin (e.g. sunburn, cuts, or acne) due to the increased risk of skin reaction. . DO NOT SHAVE over the area to be treated:  To establish good contact between the Patch and the skin, excessive hair may be clipped. . DO NOT place the Patch or electrodes on or over your eyes, directly over your heart, or brain. . DO NOT reuse the Patch or electrodes as this may cause burns to occur.

## 2017-12-17 ENCOUNTER — Ambulatory Visit (INDEPENDENT_AMBULATORY_CARE_PROVIDER_SITE_OTHER): Payer: 59 | Admitting: Specialist

## 2017-12-17 ENCOUNTER — Encounter (INDEPENDENT_AMBULATORY_CARE_PROVIDER_SITE_OTHER): Payer: Self-pay | Admitting: Specialist

## 2017-12-17 VITALS — BP 134/82 | HR 83 | Ht 61.0 in | Wt 225.0 lb

## 2017-12-17 DIAGNOSIS — M170 Bilateral primary osteoarthritis of knee: Secondary | ICD-10-CM | POA: Diagnosis not present

## 2017-12-17 DIAGNOSIS — M4316 Spondylolisthesis, lumbar region: Secondary | ICD-10-CM

## 2017-12-17 DIAGNOSIS — M48062 Spinal stenosis, lumbar region with neurogenic claudication: Secondary | ICD-10-CM | POA: Diagnosis not present

## 2017-12-17 NOTE — Patient Instructions (Signed)
  Knee is suffering from osteoarthritis, only real proven treatments are Weight loss, NSIADs like meloxicam and exercise. Well padded shoes help. Ice the knee 2-3 times a day 15-20 mins at a time. Avoid frequent bending and stooping  No lifting greater than 10 lbs. May use ice or moist heat for pain. Weight loss is of benefit. Handicap license is approved.

## 2017-12-17 NOTE — Progress Notes (Signed)
Office Visit Note   Patient: Cristina [name]           Date of Birth: Dec 04, 1960           MRN: [id number] Visit Date: 12/17/2017              Requested by: [name], MD [address] [place], [place] [address] PCP: [name], MD   Assessment & Plan: Visit Diagnoses:  1. Bilateral primary osteoarthritis of knee   2. Spinal stenosis of lumbar region with neurogenic claudication   3. Spondylolisthesis, lumbar region     Plan:  Knee is suffering from osteoarthritis, only real proven treatments are Weight loss, NSIADs like meloxicam and exercise. Well padded shoes help. Ice the knee 2-3 times a day 15-20 mins at a time. Avoid frequent bending and stooping  No lifting greater than 10 lbs. May use ice or moist heat for pain. Weight loss is of benefit. Handicap license is approved.  Follow-Up Instructions: Return in about 3 months (around 03/16/2018).   Orders:  No orders of the defined types were placed in this encounter.  No orders of the defined types were placed in this encounter.     Procedures: No procedures performed   Clinical Data: No additional findings.   Subjective: Chief Complaint  Patient presents with  . Left Knee - Follow-up    57 year old female with left knee greater than the right and a spondylolisthesis L4-5 grade 1-2 with stenosis pattern. 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 The left knee hurts all the time and her back symptoms not as bad as she sits mainly. Takes gabapentin 300 mg BID and meloxicam 7.5. But is avoiding use of meloxicam or naprosyn. Taking tylenol mostly twice a day. Starting post sitting is uncomfortable. Saw PT and had leg lifts only.     Review of Systems  Constitutional: Negative.   HENT: Negative.   Eyes: Negative.   Respiratory: Negative.   Cardiovascular: Negative.   Gastrointestinal: Negative.   Endocrine: Negative.   Genitourinary:  Negative.   Musculoskeletal: Negative.   Skin: Negative.   Allergic/Immunologic: Negative.   Neurological: Negative.   Hematological: Negative.   Psychiatric/Behavioral: Negative.      Objective: Vital Signs: BP 134/82 (BP Location: Left Arm, Patient Position: Sitting)   Pulse 83   Ht 5' 1"  (1.549 m)   Wt 225 lb (102.1 kg)   BMI 42.51 kg/m   Physical Exam  Constitutional: She is oriented to person, place, and time. She appears well-developed and well-nourished.  HENT:  Head: Normocephalic and atraumatic.  Eyes: EOM are normal. Pupils are equal, round, and reactive to light.  Neck: Normal range of motion. Neck supple.  Pulmonary/Chest: Effort normal and breath sounds normal.  Abdominal: Soft. Bowel sounds are normal.  Neurological: She is alert and oriented to person, place, and time.  Skin: Skin is warm and dry.  Psychiatric: She has a normal mood and affect. Her behavior is normal. Judgment and thought content normal.    Back Exam   Tenderness  The patient is experiencing tenderness in the lumbar.  Range of Motion  Extension: abnormal  Flexion: abnormal  Lateral bend right: abnormal  Lateral bend left: abnormal  Rotation right: abnormal  Rotation left: abnormal   Muscle Strength  Right Quadriceps:  5/5  Left Quadriceps:  5/5  Right Hamstrings:  5/5  Left Hamstrings:  5/5   Tests  Straight leg raise right: negative Straight leg raise left: negative  Reflexes  Babinski's sign: normal   Other  Toe walk: normal Heel walk: normal Sensation: decreased Gait: abnormal  Erythema: no back redness Scars: present      Specialty Comments:  No specialty comments available.  Imaging: No results found.   PMFS History: Patient Active Problem List   Diagnosis Date Noted  . Type 2 diabetes mellitus without complication, without long-term current use of insulin ([place]) 05/22/2017  . Class 3 obesity with serious comorbidity and body mass index (BMI) of 40.0  to 44.9 in adult 05/21/2017  . Family history of colon cancer 04/01/2017  . Irritable bowel syndrome with diarrhea 04/01/2017  . NSAID long-term use 04/01/2017  . Lymphocytosis 02/24/2017  . Essential hypertension 02/12/2017  . Mild intermittent asthma with acute exacerbation 12/12/2016  . Left shoulder pain 06/02/2015  . Insomnia 05/12/2014  . Fibromyalgia 05/12/2014  . Allergic rhinitis 03/28/2014  . Osteoarthritis 12/20/2013  . Cervical strain 06/21/2013  . Low back pain 06/21/2013  . Metatarsalgia of both feet 03/11/2013  . Sciatica 01/25/2013  . Restless leg syndrome 11/24/2012  . Pure hypercholesterolemia 10/20/2012  . OSA on CPAP 10/20/2012  . Depression 10/20/2012  . Iron deficiency anemia, unspecified 10/01/2012  . Diabetes mellitus, type 2 ([place]) 12/31/2010  . ANAL FISSURE 12/31/2010  . Morbid obesity ([place]) 01/08/2007  . CONSTIPATION 01/08/2007   Past Medical History:  Diagnosis Date  . Allergic rhinitis, cause unspecified   . Anemia   . Arthritis   . Asthma   . Back pain   . Chest pain   . Constipation   . CTS (carpal tunnel syndrome)   . Cystocele   . Depression   . Diabetes mellitus   . Dysfunction of eustachian tube   . Fatty liver   . Fissure, anal   . Gallbladder problem   . Genital herpes   . GERD (gastroesophageal reflux disease)   . Hemorrhoid   . Hx of migraine headaches   . Hyperlipidemia   . Hypertension   . IBS (irritable bowel syndrome)   . Insomnia   . Iron deficiency anemia, unspecified   . Joint pain   . Nausea   . Obesity   . OSA on CPAP   . Osteoarthritis   . Rectocele   . Sleep apnea   . TMJ (dislocation of temporomandibular joint)     Family History  Problem Relation Age of Onset  . Hypertension Mother 49  . Osteoarthritis Mother   . Diabetes Mother   . Colon cancer Mother 62  . Cancer Mother 50       colon cancer  . Hyperlipidemia Mother   . Obesity Mother   . Sudden death Mother   . Arthritis Mother   . Colon  cancer Paternal Grandmother   . Cancer Paternal Grandmother        colon cancer  . Stroke Maternal Grandmother   . Colon cancer Maternal Grandmother   . Colon polyps Brother        x 2 brother  . Diabetes Daughter   . Hypertension Daughter   . Sleep apnea Daughter   . Mental illness Daughter        depression  . Migraines Daughter   . Cirrhosis Brother        alcoholism  . Alcohol abuse Brother   . Migraines Daughter   . Mental illness Daughter  anxiety attacks  . Protein S deficiency Daughter   . Sleep apnea Brother   . Hyperlipidemia Brother   . Migraines Brother   . Obstructive Sleep Apnea Brother   . Colon cancer Paternal Aunt     Past Surgical History:  Procedure Laterality Date  . ABDOMINAL HYSTERECTOMY  11/11/2010   Marvel Plan.  Ovaries intact.  Uterine fibroids with DUB.  [name] CARPAL TUNNEL RELEASE  1989   Left  . CHOLECYSTECTOMY    . CHOLECYSTECTOMY    . COLONOSCOPY  06/29/2012   normal.  Repeat in 5 years.  [name] [place]  . ESOPHAGOGASTRODUODENOSCOPY  12/13/2011   dysphagia.  Henrene Pastor.  Normal.  . Sleep study  09/11/2012   severe OSA.  CPAP titration at 12 cm water pressure.  . TUBAL LIGATION  1988   Social History   Occupational History  . Occupation: Audiological scientist: [employer]    Comment: [employer]  Tobacco Use  . Smoking status: Former Research scientist (life sciences)  . Smokeless tobacco: Never Used  . Tobacco comment: smoked occasionally longest 6 mos.  Substance and Sexual Activity  . Alcohol use: No    Alcohol/week: 0.6 oz    Types: 1 Glasses of wine per week  . Drug use: No  . Sexual activity: No    Birth control/protection: None

## 2017-12-18 ENCOUNTER — Ambulatory Visit: Payer: 59

## 2017-12-22 ENCOUNTER — Ambulatory Visit (INDEPENDENT_AMBULATORY_CARE_PROVIDER_SITE_OTHER): Payer: 59 | Admitting: Physician Assistant

## 2017-12-22 VITALS — BP 144/78 | HR 100 | Temp 98.5°F | Resp 18 | Ht 61.0 in | Wt 236.0 lb

## 2017-12-22 DIAGNOSIS — S29019A Strain of muscle and tendon of unspecified wall of thorax, initial encounter: Secondary | ICD-10-CM

## 2017-12-22 MED ORDER — CYCLOBENZAPRINE HCL 10 MG PO TABS: 5.0000 mg | ORAL_TABLET | Freq: Three times a day (TID) | ORAL | 0 refills | Status: DC | PRN

## 2017-12-22 MED FILL — PANTOPRAZOLE SOD DR 40 MG T: 40 | 90 days supply | Qty: 180 | Fill #1

## 2017-12-22 MED FILL — CYCLOBENZAPRINE 10 MG TAB: 10 | 10 days supply | Qty: 30 | Fill #0

## 2017-12-22 MED FILL — MONTELUKAST SOD 10 MG TAB: 10 | 90 days supply | Qty: 90 | Fill #1

## 2017-12-22 NOTE — Patient Instructions (Addendum)
I would like you to perform small stretches of the back three times per day.  Choose three pictures and perform them three times per day.  You will ice the back area of pain, directly following your stretches.  Allow 15 minutes. Please do not operate heavy machinery while taking the flexeril.    Mid-Back Strain Rehab Ask your health care provider which exercises are safe for you. Do exercises exactly as told by your health care provider and adjust them as directed. It is normal to feel mild stretching, pulling, tightness, or discomfort as you do these exercises, but you should stop right away if you feel sudden pain or your pain gets worse. Do not begin these exercises until told by your health care provider. Stretching and range of motion exercises This exercise warms up your muscles and joints and improves the movement and flexibility of your back and shoulders. This exercise also help to relieve pain. Exercise A: Chest and spine stretch  1. Lie down on your back on a firm surface. 2. Roll a towel or a small blanket so it is about 4 inches (10 cm) in diameter. 3. Put the towel lengthwise under the middle of your back so it is under your spine, but not under your shoulder blades. 4. To increase the stretch, you may put your hands behind your head and let your elbows fall to your sides. 5. Hold for __________ seconds. Repeat exercise __________ times. Complete this exercise __________ times a day. Strengthening exercises These exercises build strength and endurance in your back and your shoulder blade muscles. Endurance is the ability to use your muscles for a long time, even after they get tired. Exercise B: Alternating arm and leg raises  1. Get on your hands and knees on a firm surface. If you are on a hard floor, you may want to use padding to cushion your knees, such as an exercise mat. 2. Line up your arms and legs. Your hands should be below your shoulders, and your knees should be below  your hips. 3. Lift your left leg behind you. At the same time, raise your right arm and straighten it in front of you. ? Do not lift your leg higher than your hip. ? Do not lift your arm higher than your shoulder. ? Keep your abdominal and back muscles tight. ? Keep your hips facing the ground. ? Do not arch your back. ? Keep your balance carefully, and do not hold your breath. 4. Hold for __________ seconds. 5. Slowly return to the starting position and repeat with your right leg and your left arm. Repeat __________ times. Complete this exercise __________ times a day. Exercise C: Straight arm rows ( shoulder extension) 1. Stand with your feet shoulder width apart. 2. Secure an exercise band to a stable object in front of you so the band is at or above shoulder height. 3. Hold one end of the exercise band in each hand. 4. Straighten your elbows and lift your hands up to shoulder height. 5. Step back, away from the secured end of the exercise band, until the band stretches. 6. Squeeze your shoulder blades together and pull your hands down to the sides of your thighs. Stop when your hands are straight down by your sides. Do not let your hands go behind your body. 7. Hold for __________ seconds. 8. Slowly return to the starting position. Repeat __________ times. Complete this exercise __________ times a day. Exercise D: Shoulder external rotation, prone  1. Lie on your abdomen on a firm bed so your left / right forearm hangs over the edge of the bed and your upper arm is on the bed, straight out from your body. ? Your elbow should be bent. ? Your palm should be facing your feet. 2. If instructed, hold a __________ weight in your hand. 3. Squeeze your shoulder blade toward the middle of your back. Do not let your shoulder lift toward your ear. 4. Keep your elbow bent in an "L" shape (90 degrees) while you slowly move your forearm up toward the ceiling. Move your forearm up to the height of  the bed, toward your head. ? Your upper arm should not move. ? At the top of the movement, your palm should face the floor. 5. Hold for __________ seconds. 6. Slowly return to the starting position and relax your muscles. Repeat __________ times. Complete this exercise __________ times a day. Exercise E: Scapular retraction and external rotation, rowing  1. Sit in a stable chair without armrests, or stand. 2. Secure an exercise band to a stable object in front of you so it is at shoulder height. 3. Hold one end of the exercise band in each hand. 4. Bring your arms out straight in front of you. 5. Step back, away from the secured end of the exercise band, until the band stretches. 6. Pull the band backward. As you do this, bend your elbows and squeeze your shoulder blades together, but avoid letting the rest of your body move. Do not let your shoulders lift up toward your ears. 7. Stop when your elbows are at your sides or slightly behind your body. 8. Hold for __________ seconds. 9. Slowly straighten your arms to return to the starting position. Repeat __________ times. Complete this exercise __________ times a day. Posture and body mechanics  Body mechanics refers to the movements and positions of your body while you do your daily activities. Posture is part of body mechanics. Good posture and healthy body mechanics can help to relieve stress in your body's tissues and joints. Good posture means that your spine is in its natural S-curve position (your spine is neutral), your shoulders are pulled back slightly, and your head is not tipped forward. The following are general guidelines for applying improved posture and body mechanics to your everyday activities. Standing   When standing, keep your spine neutral and your feet about hip-width apart. Keep a slight bend in your knees. Your ears, shoulders, and hips should line up.  When you do a task in which you lean forward while standing in  one place for a long time, place one foot up on a stable object that is 2-4 inches (5-10 cm) high, such as a footstool. This helps keep your spine neutral. Sitting   When sitting, keep your spine neutral and keep your feet flat on the floor. Use a footrest, if necessary, and keep your thighs parallel to the floor. Avoid rounding your shoulders, and avoid tilting your head forward.  When working at a desk or a computer, keep your desk at a height where your hands are slightly lower than your elbows. Slide your chair under your desk so you are close enough to maintain good posture.  When working at a computer, place your monitor at a height where you are looking straight ahead and you do not have to tilt your head forward or downward to look at the screen. Resting  When lying down and resting, avoid  positions that are most painful for you.  If you have pain with activities such as sitting, bending, stooping, or squatting (flexion-based activities), lie in a position in which your body does not bend very much. For example, avoid curling up on your side with your arms and knees near your chest (fetal position).  If you have pain with activities such as standing for a long time or reaching with your arms (extension-based activities), lie with your spine in a neutral position and bend your knees slightly. Try the following positions:  Lying on your side with a pillow between your knees.  Lying on your back with a pillow under your knees.  Lifting   When lifting objects, keep your feet at least shoulder-width apart and tighten your abdominal muscles.  Bend your knees and hips and keep your spine neutral. It is important to lift using the strength of your legs, not your back. Do not lock your knees straight out.  Always ask for help to lift heavy or awkward objects. This information is not intended to replace advice given to you by your health care provider. Make sure you discuss any questions  you have with your health care provider. Document Released: 10/28/2005 Document Revised: 07/04/2016 Document Reviewed: 08/09/2015 Elsevier Interactive Patient Education  2018 [employer].     IF you received an x-ray today, you will receive an invoice from [facility]. Please contact [facility] at [phone] with questions or concerns regarding your invoice.   IF you received labwork today, you will receive an invoice from [facility]. Please contact [facility] at [phone] with questions or concerns regarding your invoice.   Our billing staff will not be able to assist you with questions regarding bills from these companies.  You will be contacted with the lab results as soon as they are available. The fastest way to get your results is to activate your My Chart account. Instructions are located on the last page of this paperwork. If you have not heard from [name] regarding the results in 2 weeks, please contact this office.

## 2017-12-22 NOTE — Progress Notes (Signed)
PRIMARY CARE AT [facility] [address], [place] [address] [phone]  Date:  12/22/2017   Name:  Cristina [name]   DOB:  August 29, 1961   MRN:  [id number]  PCP:  [name], MD    History of Present Illness:  Cristina [name] is a 57 y.o. female patient who presents to PCP with  Chief Complaint  Patient presents with  . Flank Pain    right side, pt states feels like she pulled a muslce     Symptoms onset 2 weeks. Pain at right sided when she twist in any direction.  When she reaches, the pain is also aggravated.  She does not feel it She thinks she may have picked up something heavy.   No urinary pain, hematuria, or frequency.   She has some pain with deep inhalation.  No coughing.  No coughing. No abnormal nightsweats.    Patient Active Problem List   Diagnosis Date Noted  . Type 2 diabetes mellitus without complication, without long-term current use of insulin ([place]) 05/22/2017  . Class 3 obesity with serious comorbidity and body mass index (BMI) of 40.0 to 44.9 in adult 05/21/2017  . Family history of colon cancer 04/01/2017  . Irritable bowel syndrome with diarrhea 04/01/2017  . NSAID long-term use 04/01/2017  . Lymphocytosis 02/24/2017  . Essential hypertension 02/12/2017  . Mild intermittent asthma with acute exacerbation 12/12/2016  . Left shoulder pain 06/02/2015  . Insomnia 05/12/2014  . Fibromyalgia 05/12/2014  . Allergic rhinitis 03/28/2014  . Osteoarthritis 12/20/2013  . Cervical strain 06/21/2013  . Low back pain 06/21/2013  . Metatarsalgia of both feet 03/11/2013  . Sciatica 01/25/2013  . Restless leg syndrome 11/24/2012  . Pure hypercholesterolemia 10/20/2012  . OSA on CPAP 10/20/2012  . Depression 10/20/2012  . Iron deficiency anemia, unspecified 10/01/2012  . Diabetes mellitus, type 2 ([place]) 12/31/2010  . ANAL FISSURE 12/31/2010  . Morbid obesity ([place]) 01/08/2007  . CONSTIPATION 01/08/2007    Past Medical History:  Diagnosis Date  . Allergic  rhinitis, cause unspecified   . Anemia   . Arthritis   . Asthma   . Back pain   . Chest pain   . Constipation   . CTS (carpal tunnel syndrome)   . Cystocele   . Depression   . Diabetes mellitus   . Dysfunction of eustachian tube   . Fatty liver   . Fissure, anal   . Gallbladder problem   . Genital herpes   . GERD (gastroesophageal reflux disease)   . Hemorrhoid   . Hx of migraine headaches   . Hyperlipidemia   . Hypertension   . IBS (irritable bowel syndrome)   . Insomnia   . Iron deficiency anemia, unspecified   . Joint pain   . Nausea   . Obesity   . OSA on CPAP   . Osteoarthritis   . Rectocele   . Sleep apnea   . TMJ (dislocation of temporomandibular joint)     Past Surgical History:  Procedure Laterality Date  . ABDOMINAL HYSTERECTOMY  11/11/2010   [place] Plan.  Ovaries intact.  Uterine fibroids with DUB.  [name] CARPAL TUNNEL RELEASE  1989   Left  . CHOLECYSTECTOMY    . CHOLECYSTECTOMY    . COLONOSCOPY  06/29/2012   normal.  Repeat in 5 years.  [name] [place]  . ESOPHAGOGASTRODUODENOSCOPY  12/13/2011   dysphagia.  Henrene Pastor.  Normal.  . Sleep study  09/11/2012   severe  OSA.  CPAP titration at 12 cm water pressure.  . TUBAL LIGATION  1988    Social History   Tobacco Use  . Smoking status: Former Research scientist (life sciences)  . Smokeless tobacco: Never Used  . Tobacco comment: smoked occasionally longest 6 mos.  Substance Use Topics  . Alcohol use: No    Alcohol/week: 0.6 oz    Types: 1 Glasses of wine per week  . Drug use: No    Family History  Problem Relation Age of Onset  . Hypertension Mother 62  . Osteoarthritis Mother   . Diabetes Mother   . Colon cancer Mother 77  . Cancer Mother 9       colon cancer  . Hyperlipidemia Mother   . Obesity Mother   . Sudden death Mother   . Arthritis Mother   . Colon cancer Paternal Grandmother   . Cancer Paternal Grandmother        colon cancer  . Stroke Maternal Grandmother   . Colon cancer Maternal Grandmother    . Colon polyps Brother        x 2 brother  . Diabetes Daughter   . Hypertension Daughter   . Sleep apnea Daughter   . Mental illness Daughter        depression  . Migraines Daughter   . Cirrhosis Brother        alcoholism  . Alcohol abuse Brother   . Migraines Daughter   . Mental illness Daughter        anxiety attacks  . Protein S deficiency Daughter   . Sleep apnea Brother   . Hyperlipidemia Brother   . Migraines Brother   . Obstructive Sleep Apnea Brother   . Colon cancer Paternal Aunt     Allergies  Allergen Reactions  . Fish Oil Rash    Medication list has been reviewed and updated.  Current Outpatient Medications on File Prior to Visit  Medication Sig Dispense Refill  . acyclovir (ZOVIRAX) 400 MG tablet TAKE 1 TABLET BY MOUTH 2 TIMES DAILY. 180 tablet 3  . albuterol (PROAIR HFA) 108 (90 Base) MCG/ACT inhaler Inhale 2 puffs into the lungs every 6 (six) hours as needed. 18 g 1  . AMBULATORY NON FORMULARY MEDICATION Reported on 10/26/2015    . ANUCORT-HC 25 MG suppository PLACE 1 SUPPOSITORY RECTALLY 2 TIMES DAILY AS NEEDED FOR HEMORRHOIDS. 25 suppository 5  . aspirin EC 81 MG tablet Take 81 mg by mouth daily.    [name] atorvastatin (LIPITOR) 20 MG tablet Take 1 tablet (20 mg total) by mouth daily. 90 tablet 3  . azelastine (ASTELIN) 0.1 % nasal spray PLACE 2 SPRAYS INTO BOTH NOSTRILS 2 TIMES DAILY AS DIRECTED 90 mL 3  . azelastine (OPTIVAR) 0.05 % ophthalmic solution Place 1 drop into both eyes daily. PRN  3  . BIOTIN 5000 PO Take by mouth.    [name] buPROPion (WELLBUTRIN SR) 200 MG 12 hr tablet Take 1 tablet (200 mg total) by mouth daily. 90 tablet 0  . Calcium Carbonate-Vitamin D (CALCIUM + D PO) Take 1 tablet by mouth daily.    . canagliflozin (INVOKANA) 300 MG TABS tablet Take 1 tablet (300 mg total) by mouth daily before breakfast. 90 tablet 3  . cetirizine (ZYRTEC) 10 MG tablet Take 10 mg by mouth daily.    [name] diltiazem 2 % GEL Apply 1 application topically 3 (three)  times daily. 1 Package 3  . docusate sodium (COLACE) 100 MG capsule Take 100 mg by mouth  as needed. Reported on 05/04/2016    . ferrous sulfate 325 (65 FE) MG EC tablet Take 325 mg by mouth daily.     . fluticasone (FLONASE) 50 MCG/ACT nasal spray PLACE 2 SPRAYS INTO BOTH NOSTRILS DAILY. 48 g 3  . gabapentin (NEURONTIN) 300 MG capsule Take 1 capsule (300 mg total) by mouth 3 (three) times daily. 270 capsule 1  . glipiZIDE (GLIPIZIDE XL) 10 MG 24 hr tablet TAKE 1 TABLET BY MOUTH DAILY WITH BREAKFAST. 90 tablet 3  . Glucosamine HCl 1000 MG TABS Take 1,000 mg by mouth 2 (two) times daily with a meal.     . glucose blood test strip accucheck guide strips and fast clix lancets Use as instructed dm 2 100 each 1  . HYDROcodone-acetaminophen (NORCO/VICODIN) 5-325 MG tablet Take 1 tablet by mouth every 6 (six) hours as needed for moderate pain. 30 tablet 0  . hydrocortisone 2.5 % ointment Apply topically 2 (two) times daily. 30 g 0  . Insulin Pen Needle (PEN NEEDLES) 31G X 5 MM MISC 1 each by Does not apply route daily. USE TO INJECT VICTOZA DAILY AS DIRECTED 100 each 3  . liraglutide 18 MG/3ML SOPN Inject 0.3 mLs (1.8 mg total) into the skin daily. 6 mL 11  . meloxicam (MOBIC) 7.5 MG tablet Take 1-2 tablets (7.5-15 mg total) by mouth daily. 30 tablet 0  . metFORMIN (GLUCOPHAGE) 1000 MG tablet TAKE 1 TABLET BY MOUTH 2 TIMES DAILY WITH A MEAL. 180 tablet 3  . montelukast (SINGULAIR) 10 MG tablet Take 1 tablet (10 mg total) by mouth at bedtime. 30 tablet 0  . Multiple Vitamin (MULTIVITAMIN) capsule Take 1 capsule by mouth daily.    . naproxen (NAPROSYN) 500 MG tablet Take 1 tablet (500 mg total) by mouth 2 (two) times daily. 30 tablet 0  . nitroGLYCERIN (NITROSTAT) 0.4 MG SL tablet Place 1 tablet (0.4 mg total) under the tongue every 5 (five) minutes as needed for chest pain. 20 tablet 0  . pantoprazole (PROTONIX) 40 MG tablet TAKE 1 TABLET BY MOUTH 2 TIMES DAILY. 180 tablet 3  . rOPINIRole (REQUIP) 1 MG  tablet Take 1 tablet (1 mg total) by mouth at bedtime. 90 tablet 3  . sertraline (ZOLOFT) 100 MG tablet Take 1.5 tablets (150 mg total) by mouth daily. 135 tablet 3  . TRUEPLUS LANCETS 30G MISC USE TO CHECK BLOOD GLUCOSE DAILY AS DIRECTED 100 each 3  . Vitamin D, Ergocalciferol, (DRISDOL) 50000 units CAPS capsule Take 1 capsule (50,000 Units total) by mouth every 7 (seven) days. 12 capsule 0   Current Facility-Administered Medications on File Prior to Visit  Medication Dose Route Frequency Provider Last Rate Last Dose  . technetium tetrofosmin (TC-MYOVIEW) injection 35.5 millicurie  97.4 millicurie Intravenous Once PRN Larey Dresser, MD        ROS ROS otherwise unremarkable unless listed above.  Physical Examination: BP (!) 144/78   Pulse 100   Temp 98.5 F (36.9 C) (Oral)   Resp 18   Ht 5' 1"  (1.549 m)   Wt 236 lb (107 kg)   SpO2 98%   BMI 44.59 kg/m  Ideal Body Weight: Weight in (lb) to have BMI = 25: 132  Physical Exam  Constitutional: She is oriented to person, place, and time. She appears well-developed and well-nourished. No distress.  HENT:  Head: Normocephalic and atraumatic.  Right Ear: External ear normal.  Left Ear: External ear normal.  Eyes: Conjunctivae and EOM are normal. Pupils  are equal, round, and reactive to light.  Cardiovascular: Normal rate.  Pulmonary/Chest: Effort normal. No respiratory distress.  Musculoskeletal:       Thoracic back: She exhibits tenderness and pain (no swelling but tender withotu crepitus.  tender along the musculature.  no spinous tenderness). She exhibits normal range of motion and no spasm.  Neurological: She is alert and oriented to person, place, and time.  Skin: She is not diaphoretic.  Psychiatric: She has a normal mood and affect. Her behavior is normal.     Assessment and Plan: Cristina [name] is a 58 y.o. female who is here today for cc of  Chief Complaint  Patient presents with  . Flank Pain    right side, pt  states feels like she pulled a muslce  start flexeril, stretches and icing.  Rtc in 2 weeks for recheck.  Thoracic myofascial strain, initial encounter - Plan: cyclobenzaprine (FLEXERIL) 10 MG tablet  Ivar Drape, PA-C [facility] 2/12/201910:40 AM

## 2017-12-23 ENCOUNTER — Ambulatory Visit: Payer: 59 | Admitting: Physical Therapy

## 2017-12-23 ENCOUNTER — Encounter: Payer: Self-pay | Admitting: Physician Assistant

## 2017-12-25 ENCOUNTER — Ambulatory Visit: Payer: 59 | Admitting: Physical Therapy

## 2017-12-30 ENCOUNTER — Ambulatory Visit: Payer: 59

## 2017-12-30 NOTE — Progress Notes (Signed)
Subjective:    Patient ID: Cristina [name], female    DOB: 05-25-1961, 57 y.o.   MRN: [id number]  12/31/2017  Chronic Conditions (6 month follow-up )    HPI This 57 y.o. female presents for evaluation of DMII, hypertension, hypercholesterolemia, anxiety/depression.  Management changes made at last visit include the following: -New onset LEFT leg swelling consistent with contusion LEFT calf; send for venous doppler to rule out DVT. -obtain labs for chronic disease management.   -no longer seeing bariatric weight loss clinic; encourage exercise and attempts at weight loss.   Update today: Sugars are crazy.  Has gained 11 pounds in three weeks.  Eating out a lot lately.  [employer], [employer]. Sugars running not sure; not checking. [employer] meter linked to phone; phone is not compatible.  Last sugar 216 two weeks ago fasting.  Compliance with medications; no exercise.  Signed up with [employer]. Ate pancakes 2, syrup, eggs last night for supper.  L knee sprain; will take six weeks to heal; severe sprain with fall.  Dr. Louanne Skye.   Needs TKR B.  Needs to have CTR also performed yet not enough PAL yet.   BP Readings from Last 3 Encounters:  12/31/17 122/72  12/22/17 (!) 144/78  12/17/17 134/82   Wt Readings from Last 3 Encounters:  12/31/17 236 lb (107 kg)  12/22/17 236 lb (107 kg)  12/17/17 225 lb (102.1 kg)   Immunization History  Administered Date(s) Administered  . Hepatitis B 11/12/1999  . Influenza Split 11/11/2010, 08/14/2012, 08/25/2014  . Influenza-Unspecified 07/12/2016, 08/01/2017  . Pneumococcal Polysaccharide-23 11/12/2007, 05/04/2016  . Td 11/11/1998  . Tdap 02/22/2010    Review of Systems  Constitutional: Negative for chills, diaphoresis, fatigue and fever.  Eyes: Negative for visual disturbance.  Respiratory: Negative for cough and shortness of breath.   Cardiovascular: Negative for chest pain, palpitations and leg swelling.  Gastrointestinal: Negative for  abdominal pain, constipation, diarrhea, nausea and vomiting.  Endocrine: Negative for cold intolerance, heat intolerance, polydipsia, polyphagia and polyuria.  Musculoskeletal: Positive for arthralgias, gait problem and joint swelling.  Neurological: Negative for dizziness, tremors, seizures, syncope, facial asymmetry, speech difficulty, weakness, light-headedness, numbness and headaches.  Psychiatric/Behavioral: Negative for dysphoric mood. The patient is not nervous/anxious.     Past Medical History:  Diagnosis Date  . Allergic rhinitis, cause unspecified   . Anemia   . Arthritis   . Asthma   . Back pain   . Chest pain   . Constipation   . CTS (carpal tunnel syndrome)   . Cystocele   . Depression   . Diabetes mellitus   . Dysfunction of eustachian tube   . Fatty liver   . Fissure, anal   . Gallbladder problem   . Genital herpes   . GERD (gastroesophageal reflux disease)   . Hemorrhoid   . Hx of migraine headaches   . Hyperlipidemia   . Hypertension   . IBS (irritable bowel syndrome)   . Insomnia   . Iron deficiency anemia, unspecified   . Joint pain   . Nausea   . Obesity   . OSA on CPAP   . Osteoarthritis   . Rectocele   . Sleep apnea   . TMJ (dislocation of temporomandibular joint)    Past Surgical History:  Procedure Laterality Date  . ABDOMINAL HYSTERECTOMY  11/11/2010   [place] Plan.  Ovaries intact.  Uterine fibroids with DUB.  [name] CARPAL TUNNEL RELEASE  1989   Left  . CHOLECYSTECTOMY    .  CHOLECYSTECTOMY    . COLONOSCOPY  06/29/2012   normal.  Repeat in 5 years.  [name] [place]  . ESOPHAGOGASTRODUODENOSCOPY  12/13/2011   dysphagia.  Henrene Pastor.  Normal.  . Sleep study  09/11/2012   severe OSA.  CPAP titration at 12 cm water pressure.  . TUBAL LIGATION  1988   Allergies  Allergen Reactions  . Fish Oil Rash   Current Outpatient Medications on File Prior to Visit  Medication Sig Dispense Refill  . acyclovir (ZOVIRAX) 400 MG tablet TAKE 1  TABLET BY MOUTH 2 TIMES DAILY. 180 tablet 3  . albuterol (PROAIR HFA) 108 (90 Base) MCG/ACT inhaler Inhale 2 puffs into the lungs every 6 (six) hours as needed. 18 g 1  . AMBULATORY NON FORMULARY MEDICATION Reported on 10/26/2015    . ANUCORT-HC 25 MG suppository PLACE 1 SUPPOSITORY RECTALLY 2 TIMES DAILY AS NEEDED FOR HEMORRHOIDS. 25 suppository 5  . aspirin EC 81 MG tablet Take 81 mg by mouth daily.    [name] atorvastatin (LIPITOR) 20 MG tablet Take 1 tablet (20 mg total) by mouth daily. 90 tablet 3  . azelastine (ASTELIN) 0.1 % nasal spray PLACE 2 SPRAYS INTO BOTH NOSTRILS 2 TIMES DAILY AS DIRECTED 90 mL 3  . azelastine (OPTIVAR) 0.05 % ophthalmic solution Place 1 drop into both eyes daily. PRN  3  . BIOTIN 5000 PO Take by mouth.    [name] buPROPion (WELLBUTRIN SR) 200 MG 12 hr tablet Take 1 tablet (200 mg total) by mouth daily. 90 tablet 0  . Calcium Carbonate-Vitamin D (CALCIUM + D PO) Take 1 tablet by mouth daily.    . cetirizine (ZYRTEC) 10 MG tablet Take 10 mg by mouth daily.    . cyclobenzaprine (FLEXERIL) 10 MG tablet Take 0.5-1 tablets (5-10 mg total) by mouth 3 (three) times daily as needed. 30 tablet 0  . diltiazem 2 % GEL Apply 1 application topically 3 (three) times daily. 1 Package 3  . docusate sodium (COLACE) 100 MG capsule Take 100 mg by mouth as needed. Reported on 05/04/2016    . ferrous sulfate 325 (65 FE) MG EC tablet Take 325 mg by mouth daily.     . fluticasone (FLONASE) 50 MCG/ACT nasal spray PLACE 2 SPRAYS INTO BOTH NOSTRILS DAILY. 48 g 3  . gabapentin (NEURONTIN) 300 MG capsule Take 1 capsule (300 mg total) by mouth 3 (three) times daily. 270 capsule 1  . glipiZIDE (GLIPIZIDE XL) 10 MG 24 hr tablet TAKE 1 TABLET BY MOUTH DAILY WITH BREAKFAST. 90 tablet 3  . Glucosamine HCl 1000 MG TABS Take 1,000 mg by mouth 2 (two) times daily with a meal.     . glucose blood test strip accucheck guide strips and fast clix lancets Use as instructed dm 2 100 each 1  . HYDROcodone-acetaminophen  (NORCO/VICODIN) 5-325 MG tablet Take 1 tablet by mouth every 6 (six) hours as needed for moderate pain. 30 tablet 0  . hydrocortisone 2.5 % ointment Apply topically 2 (two) times daily. 30 g 0  . Insulin Pen Needle (PEN NEEDLES) 31G X 5 MM MISC 1 each by Does not apply route daily. USE TO INJECT VICTOZA DAILY AS DIRECTED 100 each 3  . meloxicam (MOBIC) 7.5 MG tablet Take 1-2 tablets (7.5-15 mg total) by mouth daily. 30 tablet 0  . metFORMIN (GLUCOPHAGE) 1000 MG tablet TAKE 1 TABLET BY MOUTH 2 TIMES DAILY WITH A MEAL. 180 tablet 3  . montelukast (SINGULAIR) 10 MG tablet Take  1 tablet (10 mg total) by mouth at bedtime. 30 tablet 0  . Multiple Vitamin (MULTIVITAMIN) capsule Take 1 capsule by mouth daily.    . naproxen (NAPROSYN) 500 MG tablet Take 1 tablet (500 mg total) by mouth 2 (two) times daily. 30 tablet 0  . nitroGLYCERIN (NITROSTAT) 0.4 MG SL tablet Place 1 tablet (0.4 mg total) under the tongue every 5 (five) minutes as needed for chest pain. 20 tablet 0  . pantoprazole (PROTONIX) 40 MG tablet TAKE 1 TABLET BY MOUTH 2 TIMES DAILY. 180 tablet 3  . rOPINIRole (REQUIP) 1 MG tablet Take 1 tablet (1 mg total) by mouth at bedtime. 90 tablet 3  . sertraline (ZOLOFT) 100 MG tablet Take 1.5 tablets (150 mg total) by mouth daily. 135 tablet 3  . TRUEPLUS LANCETS 30G MISC USE TO CHECK BLOOD GLUCOSE DAILY AS DIRECTED 100 each 3  . Vitamin D, Ergocalciferol, (DRISDOL) 50000 units CAPS capsule Take 1 capsule (50,000 Units total) by mouth every 7 (seven) days. 12 capsule 0   Current Facility-Administered Medications on File Prior to Visit  Medication Dose Route Frequency Provider Last Rate Last Dose  . technetium tetrofosmin (TC-MYOVIEW) injection 96.2 millicurie  22.9 millicurie Intravenous Once PRN Larey Dresser, MD       Social History   Socioeconomic History  . Marital status: Single    Spouse name: n/a  . Number of children: 3  . Years of education: college  . Highest education level: Not on  file  Social Needs  . Financial resource strain: Not on file  . Food insecurity - worry: Not on file  . Food insecurity - inability: Not on file  . Transportation needs - medical: Not on file  . Transportation needs - non-medical: Not on file  Occupational History  . Occupation: Audiological scientist: [place]    Comment: [employer]  Tobacco Use  . Smoking status: Former Research scientist (life sciences)  . Smokeless tobacco: Never Used  . Tobacco comment: smoked occasionally longest 6 mos.  Substance and Sexual Activity  . Alcohol use: No    Alcohol/week: 0.6 oz    Types: 1 Glasses of wine per week  . Drug use: No  . Sexual activity: No    Birth control/protection: None  Other Topics Concern  . Not on file  Social History Narrative   Marital status:  Divorced in 2000 after six years of marriage; not dating in 2017.      Children:  3 daughters (38, 68, 46); 6 grandchildren.      Lives: alone.  2 dogs; grandson stays some.      Employment:  Working at [employer]. 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      Tobacco: none      Alcohol: socially; special occasions.  Rare.      Drugs: none      Exercise:  none      Sexual activity:  Not sexually active since 2011.       Guns:  No guns in the home.       Smoke detectors in use,       Seatbelt:  Sometimes uses seat belts. 75% of time.  No texting while driving.     Family History  Problem Relation Age of Onset  . Hypertension Mother 70  . Osteoarthritis Mother   . Diabetes Mother   . Colon cancer Mother 2  . Cancer Mother 74       colon  cancer  . Hyperlipidemia Mother   . Obesity Mother   . Sudden death Mother   . Arthritis Mother   . Colon cancer Paternal Grandmother   . Cancer Paternal Grandmother        colon cancer  . Stroke Maternal Grandmother   . Colon cancer Maternal Grandmother   . Colon polyps Brother        x 2 brother  . Diabetes Daughter   . Hypertension Daughter   . Sleep apnea Daughter   . Mental  illness Daughter        depression  . Migraines Daughter   . Cirrhosis Brother        alcoholism  . Alcohol abuse Brother   . Migraines Daughter   . Mental illness Daughter        anxiety attacks  . Protein S deficiency Daughter   . Sleep apnea Brother   . Hyperlipidemia Brother   . Migraines Brother   . Obstructive Sleep Apnea Brother   . Colon cancer Paternal Aunt        Objective:    BP 122/72   Pulse 99   Temp 98 F (36.7 C) (Oral)   Resp 16   Ht 5' 1.02" (1.55 m)   Wt 236 lb (107 kg)   SpO2 96%   BMI 44.56 kg/m  Physical Exam  Constitutional: She is oriented to person, place, and time. She appears well-developed and well-nourished. No distress.  HENT:  Head: Normocephalic and atraumatic.  Right Ear: External ear normal.  Left Ear: External ear normal.  Nose: Nose normal.  Mouth/Throat: Oropharynx is clear and moist.  Eyes: Conjunctivae and EOM are normal. Pupils are equal, round, and reactive to light.  Neck: Normal range of motion. Neck supple. Carotid bruit is not present. No thyromegaly present.  Cardiovascular: Normal rate, regular rhythm, normal heart sounds and intact distal pulses. Exam reveals no gallop and no friction rub.  No murmur heard. Pulmonary/Chest: Effort normal and breath sounds normal. She has no wheezes. She has no rales.  Abdominal: Soft. Bowel sounds are normal. She exhibits no distension and no mass. There is no tenderness. There is no rebound and no guarding.  Lymphadenopathy:    She has no cervical adenopathy.  Neurological: She is alert and oriented to person, place, and time. No cranial nerve deficit.  Skin: Skin is warm and dry. No rash noted. She is not diaphoretic. No erythema. No pallor.  Psychiatric: She has a normal mood and affect. Her behavior is normal.   No results found. Depression screen [facility] 2/9 12/31/2017 12/22/2017 10/13/2017 10/13/2017 09/03/2017  Decreased Interest 1 0 0 0 0  Down, Depressed, Hopeless 1 0 1 0 0  PHQ -  2 Score 2 0 1 0 0  Altered sleeping 1 - - - -  Tired, decreased energy 0 - - - -  Change in appetite - - - - -  Feeling bad or failure about yourself  0 - - - -  Trouble concentrating 0 - - - -  Moving slowly or fidgety/restless 0 - - - -  Suicidal thoughts 0 - - - -  PHQ-9 Score 3 - - - -  Difficult doing work/chores - - - - -  Some recent data might be hidden   Fall Risk  12/31/2017 12/22/2017 10/13/2017 09/03/2017 08/09/2017  Falls in the past year? No Yes No No No  Number falls in past yr: - 1 - - -  Injury with  Fall? - Yes - - -  Comment - - - - -  Risk for fall due to : - - - - -        Assessment & Plan:   1. Type 2 diabetes mellitus with diabetic polyneuropathy, without long-term current use of insulin ([place])   2. Essential hypertension   3. Pure hypercholesterolemia   4. Fibromyalgia   5. Morbid obesity ([place])     Diabetes mellitus type 2 uncontrolled with a hemoglobin A1c of 8.1.  Patient currently maintained on Invokana 300 mg daily, glipizide 10 mg daily, metformin 2000 mg daily, Victoza 1.8 mg daily.  Next progression in therapy is insulin.  Highly recommend weight loss, exercise, and a low carbohydrate low sugar diet.  Encourage regular exercise.  Patient has stationary bike at home and can exercise regularly.  Recent noncompliance with dietary modification.  Recommend avoiding fast food.  Recommend patient prepare meals from home to avoid excessive caloric intake. Hypertension well controlled on current regimen.  Obtain labs for chronic disease management.  No changes in therapy. Hypercholesterolemia controlled on current regimen.  Obtain labs for chronic disease management. Fibromyalgia: Chronic and interferes with activity level.  Continue meloxicam, Zoloft, gabapentin, flexeril, wellbutrin as prescribed.   Morbid Obesity: ecommend weight loss, exercise for 30-60 minutes five days per week; recommend 1200 kcal restriction per day with a minimum of 60 grams of protein per  day. S/p appointment with Dr. Louanne Skye of orthopedics on 12/17/17, 11/12/08, 10/29/17, 10/15/18 for tear of MCL and knee osteoarthritis and  Carpal tunnel syndrome.  S/p physical therapy on 12/15/17, 12/04/17 for L sciatica.      Orders Placed This Encounter  Procedures  . CBC with Differential/Platelet  . Comprehensive metabolic panel    Order Specific Question:   Has the patient fasted?    Answer:   No  . Lipid panel    Order Specific Question:   Has the patient fasted?    Answer:   No  . POCT urinalysis dipstick  . POCT glucose (manual entry)  . POCT glycosylated hemoglobin (Hb A1C)   Meds ordered this encounter  Medications  . canagliflozin (INVOKANA) 300 MG TABS tablet    Sig: Take 1 tablet (300 mg total) by mouth daily before breakfast.    Dispense:  90 tablet    Refill:  3  . liraglutide (VICTOZA) 18 MG/3ML SOPN    Sig: Inject 0.3 mLs (1.8 mg total) into the skin daily.    Dispense:  6 mL    Refill:  11    Return in about 3 months (around 03/30/2018) for follow-up chronic medical conditions.   Kristi Elayne Guerin, M.D. Primary Care at [facility] previously [facility] [address] [place], [place]  [phone] phone [phone] fax

## 2017-12-31 ENCOUNTER — Other Ambulatory Visit: Payer: Self-pay

## 2017-12-31 ENCOUNTER — Encounter: Payer: Self-pay | Admitting: Family Medicine

## 2017-12-31 ENCOUNTER — Ambulatory Visit: Payer: 59 | Admitting: Family Medicine

## 2017-12-31 VITALS — BP 122/72 | HR 99 | Temp 98.0°F | Resp 16 | Ht 61.02 in | Wt 236.0 lb

## 2017-12-31 DIAGNOSIS — M797 Fibromyalgia: Secondary | ICD-10-CM

## 2017-12-31 DIAGNOSIS — E7800 Pure hypercholesterolemia, unspecified: Secondary | ICD-10-CM | POA: Diagnosis not present

## 2017-12-31 DIAGNOSIS — I10 Essential (primary) hypertension: Secondary | ICD-10-CM

## 2017-12-31 DIAGNOSIS — E1142 Type 2 diabetes mellitus with diabetic polyneuropathy: Secondary | ICD-10-CM | POA: Diagnosis not present

## 2017-12-31 LAB — GLUCOSE, POCT (MANUAL RESULT ENTRY): POC Glucose: 176 mg/dl — AB (ref 70–99)

## 2017-12-31 LAB — POCT GLYCOSYLATED HEMOGLOBIN (HGB A1C): Hemoglobin A1C: 8.1

## 2017-12-31 MED ORDER — CANAGLIFLOZIN 300 MG PO TABS: 300.0000 mg | ORAL_TABLET | Freq: Every day | ORAL | 3 refills | Status: DC

## 2017-12-31 MED ORDER — LIRAGLUTIDE 18 MG/3ML ~~LOC~~ SOPN: 1.8000 mg | PEN_INJECTOR | Freq: Every day | SUBCUTANEOUS | 11 refills | Status: DC

## 2017-12-31 NOTE — Patient Instructions (Addendum)
   IF you received an x-ray today, you will receive an invoice from [facility]. Please contact [facility] at [phone] with questions or concerns regarding your invoice.   IF you received labwork today, you will receive an invoice from [facility]. Please contact [facility] at [phone] with questions or concerns regarding your invoice.   Our billing staff will not be able to assist you with questions regarding bills from these companies.  You will be contacted with the lab results as soon as they are available. The fastest way to get your results is to activate your My Chart account. Instructions are located on the last page of this paperwork. If you have not heard from us regarding the results in 2 weeks, please contact this office.      Diabetes Mellitus and Standards of Medical Care Managing diabetes (diabetes mellitus) can be complicated. Your diabetes treatment may be managed by a team of health care providers, including:  A diet and nutrition specialist (registered dietitian).  A nurse.  A certified diabetes educator (CDE).  A diabetes specialist (endocrinologist).  An eye doctor.  A primary care provider.  A dentist.  Your health care providers follow a schedule in order to help you get the best quality of care. The following schedule is a general guideline for your diabetes management plan. Your health care providers may also give you more specific instructions. HbA1c ( hemoglobin A1c) test This test provides information about blood sugar (glucose) control over the previous 2-3 months. It is used to check whether your diabetes management plan needs to be adjusted.  If you are meeting your treatment goals, this test is done at least 2 times a year.  If you are not meeting treatment goals or if your treatment goals have changed, this test is done 4 times a year.  Blood pressure test  This test is done at every routine medical visit. For most  people, the goal is less than 130/80. Ask your health care provider what your goal blood pressure should be. Dental and eye exams  Visit your dentist two times a year.  If you have type 1 diabetes, get an eye exam 3-5 years after you are diagnosed, and then once a year after your first exam. ? If you were diagnosed with type 1 diabetes as a child, get an eye exam when you are age 10 or older and have had diabetes for 3-5 years. After the first exam, you should get an eye exam once a year.  If you have type 2 diabetes, have an eye exam as soon as you are diagnosed, and then once a year after your first exam. Foot care exam  Visual foot exams are done at every routine medical visit. The exams check for cuts, bruises, redness, blisters, sores, or other problems with the feet.  A complete foot exam is done by your health care provider once a year. This exam includes an inspection of the structure and skin of your feet, and a check of the pulses and sensation in your feet. ? Type 1 diabetes: Get your first exam 3-5 years after diagnosis. ? Type 2 diabetes: Get your first exam as soon as you are diagnosed.  Check your feet every day for cuts, bruises, redness, blisters, or sores. If you have any of these or other problems that are not healing, contact your health care provider. Kidney function test ( urine microalbumin)  This test is done once a year. ? Type 1 diabetes:   Get your first test 5 years after diagnosis. ? Type 2 diabetes: Get your first test as soon as you are diagnosed.  If you have chronic kidney disease (CKD), get a serum creatinine and estimated glomerular filtration rate (eGFR) test once a year. Lipid profile (cholesterol, HDL, LDL, triglycerides)  This test should be done when you are diagnosed with diabetes, and every 5 years after the first test. If you are on medicines to lower your cholesterol, you may need to get this test done every year. ? The goal for LDL is less than  100 mg/dL (5.5 mmol/L). If you are at high risk, the goal is less than 70 mg/dL (3.9 mmol/L). ? The goal for HDL is 40 mg/dL (2.2 mmol/L) for men and 50 mg/dL(2.8 mmol/L) for women. An HDL cholesterol of 60 mg/dL (3.3 mmol/L) or higher gives some protection against heart disease. ? The goal for triglycerides is less than 150 mg/dL (8.3 mmol/L). Immunizations  The yearly flu (influenza) vaccine is recommended for everyone 6 months or older who has diabetes.  The pneumonia (pneumococcal) vaccine is recommended for everyone 2 years or older who has diabetes. If you are 65 or older, you may get the pneumonia vaccine as a series of two separate shots.  The hepatitis B vaccine is recommended for adults shortly after they have been diagnosed with diabetes.  The Tdap (tetanus, diphtheria, and pertussis) vaccine should be given: ? According to normal childhood vaccination schedules, for children. ? Every 10 years, for adults who have diabetes.  The shingles vaccine is recommended for people who have had chicken pox and are 50 years or older. Mental and emotional health  Screening for symptoms of eating disorders, anxiety, and depression is recommended at the time of diagnosis and afterward as needed. If your screening shows that you have symptoms (you have a positive screening result), you may need further evaluation and be referred to a mental health care provider. Diabetes self-management education  Education about how to manage your diabetes is recommended at diagnosis and ongoing as needed. Treatment plan  Your treatment plan will be reviewed at every medical visit. Summary  Managing diabetes (diabetes mellitus) can be complicated. Your diabetes treatment may be managed by a team of health care providers.  Your health care providers follow a schedule in order to help you get the best quality of care.  Standards of care including having regular physical exams, blood tests, blood pressure  monitoring, immunizations, screening tests, and education about how to manage your diabetes.  Your health care providers may also give you more specific instructions based on your individual health. This information is not intended to replace advice given to you by your health care provider. Make sure you discuss any questions you have with your health care provider. Document Released: 08/25/2009 Document Revised: 07/26/2016 Document Reviewed: 07/26/2016 Elsevier Interactive Patient Education  2018 Elsevier Inc.  

## 2018-01-01 ENCOUNTER — Ambulatory Visit: Payer: 59 | Admitting: Physical Therapy

## 2018-01-01 LAB — LIPID PANEL
CHOLESTEROL TOTAL: 195 mg/dL (ref 100–199)
Chol/HDL Ratio: 4.6 ratio — ABNORMAL HIGH (ref 0.0–4.4)
HDL: 42 mg/dL (ref >39)
LDL Calculated: 111 mg/dL — ABNORMAL HIGH (ref 0–99)
TRIGLYCERIDES: 211 mg/dL — AB (ref 0–149)
VLDL CHOLESTEROL CAL: 42 mg/dL — AB (ref 5–40)

## 2018-01-01 LAB — COMPREHENSIVE METABOLIC PANEL
A/G RATIO: 1.3 (ref 1.2–2.2)
ALBUMIN: 4.1 g/dL (ref 3.5–5.5)
ALT: 32 IU/L (ref 0–32)
AST: 21 IU/L (ref 0–40)
Alkaline Phosphatase: 100 IU/L (ref 39–117)
BUN / CREAT RATIO: 21 (ref 9–23)
BUN: 17 mg/dL (ref 6–24)
Bilirubin Total: 0.3 mg/dL (ref 0.0–1.2)
CALCIUM: 9.8 mg/dL (ref 8.7–10.2)
CHLORIDE: 104 mmol/L (ref 96–106)
CO2: 21 mmol/L (ref 20–29)
CREATININE: 0.82 mg/dL (ref 0.57–1.00)
GFR, EST AFRICAN AMERICAN: 94 mL/min/{1.73_m2} (ref >59)
GFR, EST NON AFRICAN AMERICAN: 81 mL/min/{1.73_m2} (ref >59)
GLOBULIN, TOTAL: 3.2 g/dL (ref 1.5–4.5)
Glucose: 182 mg/dL — ABNORMAL HIGH (ref 65–99)
Potassium: 4.3 mmol/L (ref 3.5–5.2)
SODIUM: 144 mmol/L (ref 134–144)
Total Protein: 7.3 g/dL (ref 6.0–8.5)

## 2018-01-01 LAB — CBC WITH DIFFERENTIAL/PLATELET
BASOS: 0 %
Basophils Absolute: 0 10*3/uL (ref 0.0–0.2)
EOS (ABSOLUTE): 0.3 10*3/uL (ref 0.0–0.4)
EOS: 3 %
HEMATOCRIT: 40.4 % (ref 34.0–46.6)
HEMOGLOBIN: 13.5 g/dL (ref 11.1–15.9)
IMMATURE GRANULOCYTES: 0 %
Immature Grans (Abs): 0 10*3/uL (ref 0.0–0.1)
LYMPHS ABS: 4.7 10*3/uL — AB (ref 0.7–3.1)
Lymphs: 39 %
MCH: 27 pg (ref 26.6–33.0)
MCHC: 33.4 g/dL (ref 31.5–35.7)
MCV: 81 fL (ref 79–97)
MONOCYTES: 6 %
MONOS ABS: 0.7 10*3/uL (ref 0.1–0.9)
NEUTROS PCT: 52 %
Neutrophils Absolute: 6.3 10*3/uL (ref 1.4–7.0)
Platelets: 351 10*3/uL (ref 150–379)
RBC: 5 x10E6/uL (ref 3.77–5.28)
RDW: 16.5 % — AB (ref 12.3–15.4)
WBC: 12.1 10*3/uL — AB (ref 3.4–10.8)

## 2018-01-02 MED FILL — VICTOZA 18 MG/3 ML INJECT P: 18 | 90 days supply | Qty: 27 | Fill #0

## 2018-01-05 ENCOUNTER — Encounter (INDEPENDENT_AMBULATORY_CARE_PROVIDER_SITE_OTHER): Payer: Self-pay

## 2018-01-05 ENCOUNTER — Ambulatory Visit (INDEPENDENT_AMBULATORY_CARE_PROVIDER_SITE_OTHER): Payer: 59 | Admitting: Specialist

## 2018-01-06 ENCOUNTER — Encounter: Payer: Self-pay | Admitting: Family Medicine

## 2018-01-06 MED FILL — ACYCLOVIR 400 MG TABLET: 400 | 90 days supply | Qty: 180 | Fill #2

## 2018-01-06 MED FILL — metFORMIN HCL 1000 MG TABS: 1000 | 90 days supply | Qty: 180 | Fill #2

## 2018-01-11 MED ORDER — EMPAGLIFLOZIN 25 MG PO TABS: 25.0000 mg | ORAL_TABLET | Freq: Every day | ORAL | 3 refills | Status: DC

## 2018-01-12 MED FILL — JARDIANCE 25 MG TABLET: 25 | 90 days supply | Qty: 90 | Fill #0

## 2018-01-12 MED FILL — UNIFINE PENTIPS 31GX3/16: 31G X 5 MM | 90 days supply | Qty: 100 | Fill #2

## 2018-01-12 MED FILL — glipiZIDE ER 10 MG TB24: 10 | 90 days supply | Qty: 90 | Fill #2

## 2018-01-12 MED FILL — UNIFINE PENTIPS 31GX3/16": 31G X 5 MM | 90 days supply | Qty: 100 | Fill #2

## 2018-01-20 ENCOUNTER — Encounter (INDEPENDENT_AMBULATORY_CARE_PROVIDER_SITE_OTHER): Payer: Self-pay | Admitting: Specialist

## 2018-01-20 ENCOUNTER — Ambulatory Visit (INDEPENDENT_AMBULATORY_CARE_PROVIDER_SITE_OTHER): Payer: 59

## 2018-01-20 ENCOUNTER — Ambulatory Visit (INDEPENDENT_AMBULATORY_CARE_PROVIDER_SITE_OTHER): Payer: 59 | Admitting: Specialist

## 2018-01-20 VITALS — BP 131/75 | HR 88

## 2018-01-20 DIAGNOSIS — M1711 Unilateral primary osteoarthritis, right knee: Secondary | ICD-10-CM | POA: Diagnosis not present

## 2018-01-20 DIAGNOSIS — M1712 Unilateral primary osteoarthritis, left knee: Secondary | ICD-10-CM | POA: Diagnosis not present

## 2018-01-20 DIAGNOSIS — M4316 Spondylolisthesis, lumbar region: Secondary | ICD-10-CM | POA: Diagnosis not present

## 2018-01-20 DIAGNOSIS — G8929 Other chronic pain: Secondary | ICD-10-CM | POA: Diagnosis not present

## 2018-01-20 DIAGNOSIS — M25562 Pain in left knee: Secondary | ICD-10-CM | POA: Diagnosis not present

## 2018-01-20 MED ORDER — TRAMADOL HCL 50 MG PO TABS: 50.0000 mg | ORAL_TABLET | Freq: Four times a day (QID) | ORAL | 0 refills | Status: DC | PRN

## 2018-01-20 MED ORDER — DICLOFENAC SODIUM 1 % TD GEL: 4.0000 g | Freq: Four times a day (QID) | TRANSDERMAL | 3 refills | Status: DC

## 2018-01-20 MED FILL — DICLOFENAC SODIUM 1% GEL: 1 | 31 days supply | Qty: 500 | Fill #0

## 2018-01-20 MED FILL — traMADol HCL 50 MG TABS: 50 | 13 days supply | Qty: 50 | Fill #0

## 2018-01-20 NOTE — Patient Instructions (Addendum)
  Knee is suffering from osteoarthritis, only real proven treatments are Weight loss, NSIADs like mobic and exercise. Well padded shoes help. Ice the knee 2-3 times a day 15-20 mins at a time. Avoid bending, stooping and avoid lifting weights greater than 10 lbs. Avoid prolong standing and walking. Avoid frequent bending and stooping  No lifting greater than 10 lbs. May use ice or moist heat for pain. Weight loss is of benefit. Handicap license is approved.

## 2018-01-20 NOTE — Progress Notes (Signed)
Office Visit Note   Patient: Cristina [name]           Date of Birth: 06-04-1961           MRN: [id number] Visit Date: 01/20/2018              Requested by: [name], MD [address] [place], [place] [address] PCP: [name], MD   Assessment & Plan: Visit Diagnoses:  1. Chronic pain of left knee   2. Spondylolisthesis, lumbar region   3. Unilateral primary osteoarthritis, left knee   4. Unilateral primary osteoarthritis, right knee     Plan: Knee is suffering from osteoarthritis, only real proven treatments are Weight loss, NSIADs like mobic and exercise. Well padded shoes help. Ice the knee 2-3 times a day 15-20 mins at a time. Avoid bending, stooping and avoid lifting weights greater than 10 lbs. Avoid prolong standing and walking. Avoid frequent bending and stooping  No lifting greater than 10 lbs. May use ice or moist heat for pain. Weight loss is of benefit. Handicap license is approved.  Follow-Up Instructions: Return in about 3 years (around 01/20/2021).   Orders:  Orders Placed This Encounter  Procedures  . XR Knee 1-2 Views Left   Meds ordered this encounter  Medications  . traMADol (ULTRAM) 50 MG tablet    Sig: Take 1 tablet (50 mg total) by mouth every 6 (six) hours as needed.    Dispense:  50 tablet    Refill:  0  . diclofenac sodium (VOLTAREN) 1 % GEL    Sig: Apply 4 g topically 4 (four) times daily.    Dispense:  5 Tube    Refill:  3      Procedures: No procedures performed   Clinical Data: No additional findings.   Subjective: Chief Complaint  Patient presents with  . Left Knee - Pain    57 year old female injured left knee in October 24, 2017 with a fall with the left knee going backwards. Now with worsening pain and popping and catching in the left knee. The Left knee is swollen and uncomfortable. Unfortunately she has an elevated BMI and her knee has severe tricompartment osteoarthritis changes.     Review of Systems    Constitutional: Positive for activity change. Negative for appetite change, chills, diaphoresis, fatigue, fever and unexpected weight change.  HENT: Positive for rhinorrhea, sinus pressure and sinus pain. Negative for congestion, dental problem, drooling, ear discharge, ear pain, facial swelling, hearing loss, mouth sores, nosebleeds, postnasal drip, sneezing, sore throat, tinnitus, trouble swallowing and voice change.   Eyes: Negative.  Negative for photophobia, pain, discharge, redness, itching and visual disturbance.  Respiratory: Negative for apnea, cough, choking, chest tightness, shortness of breath, wheezing and stridor.   Cardiovascular: Positive for leg swelling. Negative for chest pain and palpitations.  Gastrointestinal: Negative for abdominal distention, abdominal pain, anal bleeding, blood in stool, constipation, diarrhea, nausea, rectal pain and vomiting.  Endocrine: Negative.  Negative for cold intolerance, heat intolerance, polydipsia, polyphagia and polyuria.  Genitourinary: Negative for difficulty urinating, dyspareunia, dysuria, enuresis, flank pain, frequency, genital sores, hematuria, menstrual problem and pelvic pain.  Musculoskeletal: Positive for arthralgias, joint swelling and neck pain. Negative for back pain, gait problem and myalgias.  Skin: Negative.  Negative for color change, pallor, rash and wound.  Allergic/Immunologic: Negative.  Negative for environmental allergies and food allergies.  Neurological: Positive for numbness. Negative for dizziness, seizures, syncope, facial asymmetry, speech difficulty, weakness, light-headedness  and headaches.  Hematological: Negative.   Psychiatric/Behavioral: Negative.      Objective: Vital Signs: BP 131/75   Pulse 88   Physical Exam  Constitutional: She is oriented to person, place, and time. She appears well-developed and well-nourished.  HENT:  Head: Normocephalic and atraumatic.  Eyes: EOM are normal. Pupils are  equal, round, and reactive to light.  Neck: Normal range of motion. Neck supple.  Pulmonary/Chest: Effort normal and breath sounds normal.  Abdominal: Soft. Bowel sounds are normal.  Musculoskeletal: Normal range of motion.       Left knee: She exhibits no effusion.  Neurological: She is alert and oriented to person, place, and time.  Skin: Skin is warm and dry.  Psychiatric: She has a normal mood and affect. Her behavior is normal. Judgment and thought content normal.    Left Knee Exam   Muscle Strength  The patient has normal left knee strength.  Tenderness  The patient is experiencing tenderness in the medial joint line, lateral joint line and patella.  Range of Motion  Extension: -10  Flexion: 110   Tests  McMurray:  Medial - positive Lateral - positive Varus: negative  Lachman:  Anterior - negative    Posterior - negative Drawer:  Anterior - negative     Posterior - negative Pivot shift: negative Patellar apprehension: negative  Other  Erythema: absent Scars: absent Sensation: normal Pulse: present Swelling: mild Effusion: no effusion present      Specialty Comments:  No specialty comments available.  Imaging: Xr Knee 1-2 Views Left  Result Date: 01/20/2018 AP standing and lateral of the left knee with moderately severe medial joint line narrowing with moderate medial joint sclerosis and osteophytes off the medial joint line. Intercondylar osteophytes and lateral femoral condyle osteophyte. Large spurs of the posterior femoral condyle and anterior femoral condyles and superior pole patella osteophytes and retropatella Erosion changes. Varus deformity.     PMFS History: Patient Active Problem List   Diagnosis Date Noted  . Type 2 diabetes mellitus without complication, without long-term current use of insulin ([place]) 05/22/2017  . Class 3 obesity with serious comorbidity and body mass index (BMI) of 40.0 to 44.9 in adult 05/21/2017  . Family history of  colon cancer 04/01/2017  . Irritable bowel syndrome with diarrhea 04/01/2017  . NSAID long-term use 04/01/2017  . Lymphocytosis 02/24/2017  . Essential hypertension 02/12/2017  . Mild intermittent asthma with acute exacerbation 12/12/2016  . Left shoulder pain 06/02/2015  . Insomnia 05/12/2014  . Fibromyalgia 05/12/2014  . Allergic rhinitis 03/28/2014  . Osteoarthritis 12/20/2013  . Cervical strain 06/21/2013  . Low back pain 06/21/2013  . Metatarsalgia of both feet 03/11/2013  . Sciatica 01/25/2013  . Restless leg syndrome 11/24/2012  . Pure hypercholesterolemia 10/20/2012  . OSA on CPAP 10/20/2012  . Depression 10/20/2012  . Iron deficiency anemia, unspecified 10/01/2012  . Diabetes mellitus, type 2 ([place]) 12/31/2010  . ANAL FISSURE 12/31/2010  . Morbid obesity ([place]) 01/08/2007  . CONSTIPATION 01/08/2007   Past Medical History:  Diagnosis Date  . Allergic rhinitis, cause unspecified   . Anemia   . Arthritis   . Asthma   . Back pain   . Chest pain   . Constipation   . CTS (carpal tunnel syndrome)   . Cystocele   . Depression   . Diabetes mellitus   . Dysfunction of eustachian tube   . Fatty liver   . Fissure, anal   . Gallbladder problem   .  Genital herpes   . GERD (gastroesophageal reflux disease)   . Hemorrhoid   . Hx of migraine headaches   . Hyperlipidemia   . Hypertension   . IBS (irritable bowel syndrome)   . Insomnia   . Iron deficiency anemia, unspecified   . Joint pain   . Nausea   . Obesity   . OSA on CPAP   . Osteoarthritis   . Rectocele   . Sleep apnea   . TMJ (dislocation of temporomandibular joint)     Family History  Problem Relation Age of Onset  . Hypertension Mother 80  . Osteoarthritis Mother   . Diabetes Mother   . Colon cancer Mother 34  . Cancer Mother 87       colon cancer  . Hyperlipidemia Mother   . Obesity Mother   . Sudden death Mother   . Arthritis Mother   . Colon cancer Paternal Grandmother   . Cancer Paternal  Grandmother        colon cancer  . Stroke Maternal Grandmother   . Colon cancer Maternal Grandmother   . Colon polyps Brother        x 2 brother  . Diabetes Daughter   . Hypertension Daughter   . Sleep apnea Daughter   . Mental illness Daughter        depression  . Migraines Daughter   . Cirrhosis Brother        alcoholism  . Alcohol abuse Brother   . Migraines Daughter   . Mental illness Daughter        anxiety attacks  . Protein S deficiency Daughter   . Sleep apnea Brother   . Hyperlipidemia Brother   . Migraines Brother   . Obstructive Sleep Apnea Brother   . Colon cancer Paternal Aunt     Past Surgical History:  Procedure Laterality Date  . ABDOMINAL HYSTERECTOMY  11/11/2010   [place] Plan.  Ovaries intact.  Uterine fibroids with DUB.  [name] CARPAL TUNNEL RELEASE  1989   Left  . CHOLECYSTECTOMY    . CHOLECYSTECTOMY    . COLONOSCOPY  06/29/2012   normal.  Repeat in 5 years.  [name] [place]  . ESOPHAGOGASTRODUODENOSCOPY  12/13/2011   dysphagia.  Henrene Pastor.  Normal.  . Sleep study  09/11/2012   severe OSA.  CPAP titration at 12 cm water pressure.  . TUBAL LIGATION  1988   Social History   Occupational History  . Occupation: Audiological scientist: [employer]    Comment: [employer]  Tobacco Use  . Smoking status: Former Research scientist (life sciences)  . Smokeless tobacco: Never Used  . Tobacco comment: smoked occasionally longest 6 mos.  Substance and Sexual Activity  . Alcohol use: No    Alcohol/week: 0.6 oz    Types: 1 Glasses of wine per week  . Drug use: No  . Sexual activity: No    Birth control/protection: None

## 2018-01-21 ENCOUNTER — Encounter: Payer: Self-pay | Admitting: Family Medicine

## 2018-02-06 ENCOUNTER — Ambulatory Visit: Payer: 59 | Admitting: Physician Assistant

## 2018-02-06 ENCOUNTER — Encounter: Payer: Self-pay | Admitting: Physician Assistant

## 2018-02-06 ENCOUNTER — Other Ambulatory Visit: Payer: Self-pay

## 2018-02-06 VITALS — BP 128/82 | HR 67 | Temp 99.1°F | Resp 16 | Ht 60.0 in | Wt 232.2 lb

## 2018-02-06 DIAGNOSIS — J329 Chronic sinusitis, unspecified: Secondary | ICD-10-CM

## 2018-02-06 DIAGNOSIS — R05 Cough: Secondary | ICD-10-CM

## 2018-02-06 DIAGNOSIS — R059 Cough, unspecified: Secondary | ICD-10-CM

## 2018-02-06 MED ORDER — AZITHROMYCIN 250 MG PO TABS: ORAL_TABLET | ORAL | 0 refills | Status: DC

## 2018-02-06 MED ORDER — BENZONATATE 100 MG PO CAPS: 100.0000 mg | ORAL_CAPSULE | Freq: Three times a day (TID) | ORAL | 0 refills | Status: DC | PRN

## 2018-02-06 MED ORDER — HYDROCODONE-HOMATROPINE 5-1.5 MG/5ML PO SYRP: 5.0000 mL | ORAL_SOLUTION | Freq: Three times a day (TID) | ORAL | 0 refills | Status: DC | PRN

## 2018-02-06 MED FILL — ATORVASTATIN 20 MG TABLET: 20 | 90 days supply | Qty: 90 | Fill #3

## 2018-02-06 MED FILL — AZITHROMYCIN 250 MG TABLET: 250 | 5 days supply | Qty: 6 | Fill #0

## 2018-02-06 MED FILL — BENZONATATE 100 MG CAPS: 100 | 7 days supply | Qty: 40 | Fill #0

## 2018-02-06 MED FILL — HYDROCODONE-HOMATROPINE SYR: 5-1.5 | 8 days supply | Qty: 120 | Fill #0

## 2018-02-06 MED FILL — ACCU-CHEK GUIDE TEST STRIP: 30 days supply | Qty: 100 | Fill #1

## 2018-02-06 MED FILL — ACCU-CHEK FASTCLIX LANCETS: 90 days supply | Qty: 102 | Fill #1

## 2018-02-06 MED FILL — GABAPENTIN 300 MG CAPSULE: 300 | 90 days supply | Qty: 270 | Fill #1

## 2018-02-06 NOTE — Progress Notes (Signed)
Cristina [name]  MRN: [id number] DOB: 03-29-1961  PCP: [name], MD  Subjective:  Pt is a 57 year old female PMH HTN, OSA, asthma, DM, IBS, obesity, iron deficiency anemia, depression and fibromyalgia who presents to clinic for cough x 2-3 weeks.  Symptoms started with a scratchy throat and runny nose about 3 weeks ago. Mucinex was helping but then she delevoped a cough. C/o right sided sinus pressure, HA, mild ear pain and hoarseness.  Cough is keeping her up at night.  No known sick contact. She takes Singulair and Flonase daily.   Denies night sweats, fever, chills, wheezing, shob, chest tightness.   Of note: Pt's daughter had her foot amputated today as a result of complications of DM foot ulcer.   Review of Systems  Constitutional: Negative for chills, diaphoresis, fatigue and fever.  HENT: Negative for congestion, postnasal drip, rhinorrhea, sinus pressure, sinus pain, sneezing and sore throat.   Respiratory: Positive for cough. Negative for chest tightness, shortness of breath and wheezing.   Cardiovascular: Negative for chest pain and palpitations.  Gastrointestinal: Negative for abdominal pain, diarrhea, nausea and vomiting.  Neurological: Negative for weakness, light-headedness and headaches.  Psychiatric/Behavioral: Negative for sleep disturbance.    Patient Active Problem List   Diagnosis Date Noted  . Type 2 diabetes mellitus without complication, without long-term current use of insulin ([place]) 05/22/2017  . Class 3 obesity with serious comorbidity and body mass index (BMI) of 40.0 to 44.9 in adult 05/21/2017  . Family history of colon cancer 04/01/2017  . Irritable bowel syndrome with diarrhea 04/01/2017  . NSAID long-term use 04/01/2017  . Lymphocytosis 02/24/2017  . Essential hypertension 02/12/2017  . Mild intermittent asthma with acute exacerbation 12/12/2016  . Left shoulder pain 06/02/2015  . Insomnia 05/12/2014  . Fibromyalgia 05/12/2014  . Allergic  rhinitis 03/28/2014  . Osteoarthritis 12/20/2013  . Cervical strain 06/21/2013  . Low back pain 06/21/2013  . Metatarsalgia of both feet 03/11/2013  . Sciatica 01/25/2013  . Restless leg syndrome 11/24/2012  . Pure hypercholesterolemia 10/20/2012  . OSA on CPAP 10/20/2012  . Depression 10/20/2012  . Iron deficiency anemia, unspecified 10/01/2012  . Diabetes mellitus, type 2 ([place]) 12/31/2010  . ANAL FISSURE 12/31/2010  . Morbid obesity ([place]) 01/08/2007  . CONSTIPATION 01/08/2007    Current Outpatient Medications on File Prior to Visit  Medication Sig Dispense Refill  . acyclovir (ZOVIRAX) 400 MG tablet TAKE 1 TABLET BY MOUTH 2 TIMES DAILY. 180 tablet 3  . albuterol (PROAIR HFA) 108 (90 Base) MCG/ACT inhaler Inhale 2 puffs into the lungs every 6 (six) hours as needed. 18 g 1  . ANUCORT-HC 25 MG suppository PLACE 1 SUPPOSITORY RECTALLY 2 TIMES DAILY AS NEEDED FOR HEMORRHOIDS. 25 suppository 5  . aspirin EC 81 MG tablet Take 81 mg by mouth daily.    [name] atorvastatin (LIPITOR) 20 MG tablet Take 1 tablet (20 mg total) by mouth daily. 90 tablet 3  . azelastine (ASTELIN) 0.1 % nasal spray PLACE 2 SPRAYS INTO BOTH NOSTRILS 2 TIMES DAILY AS DIRECTED 90 mL 3  . azelastine (OPTIVAR) 0.05 % ophthalmic solution Place 1 drop into both eyes daily. PRN  3  . BIOTIN 5000 PO Take by mouth.    [name] buPROPion (WELLBUTRIN SR) 200 MG 12 hr tablet Take 1 tablet (200 mg total) by mouth daily. 90 tablet 0  . Calcium Carbonate-Vitamin D (CALCIUM + D PO) Take 1 tablet by mouth daily.    . cetirizine (ZYRTEC)  10 MG tablet Take 10 mg by mouth daily.    . cyclobenzaprine (FLEXERIL) 10 MG tablet Take 0.5-1 tablets (5-10 mg total) by mouth 3 (three) times daily as needed. 30 tablet 0  . diclofenac sodium (VOLTAREN) 1 % GEL Apply 4 g topically 4 (four) times daily. 5 Tube 3  . diltiazem 2 % GEL Apply 1 application topically 3 (three) times daily. 1 Package 3  . docusate sodium (COLACE) 100 MG capsule Take 100 mg by mouth  as needed. Reported on 05/04/2016    . empagliflozin (JARDIANCE) 25 MG TABS tablet Take 25 mg by mouth daily. 90 tablet 3  . ferrous sulfate 325 (65 FE) MG EC tablet Take 325 mg by mouth daily.     . fluticasone (FLONASE) 50 MCG/ACT nasal spray PLACE 2 SPRAYS INTO BOTH NOSTRILS DAILY. 48 g 3  . gabapentin (NEURONTIN) 300 MG capsule Take 1 capsule (300 mg total) by mouth 3 (three) times daily. 270 capsule 1  . glipiZIDE (GLIPIZIDE XL) 10 MG 24 hr tablet TAKE 1 TABLET BY MOUTH DAILY WITH BREAKFAST. 90 tablet 3  . Glucosamine HCl 1000 MG TABS Take 1,000 mg by mouth 2 (two) times daily with a meal.     . glucose blood test strip accucheck guide strips and fast clix lancets Use as instructed dm 2 100 each 1  . HYDROcodone-acetaminophen (NORCO/VICODIN) 5-325 MG tablet Take 1 tablet by mouth every 6 (six) hours as needed for moderate pain. 30 tablet 0  . hydrocortisone 2.5 % ointment Apply topically 2 (two) times daily. 30 g 0  . Insulin Pen Needle (PEN NEEDLES) 31G X 5 MM MISC 1 each by Does not apply route daily. USE TO INJECT VICTOZA DAILY AS DIRECTED 100 each 3  . liraglutide (VICTOZA) 18 MG/3ML SOPN Inject 0.3 mLs (1.8 mg total) into the skin daily. 6 mL 11  . meloxicam (MOBIC) 7.5 MG tablet Take 1-2 tablets (7.5-15 mg total) by mouth daily. 30 tablet 0  . metFORMIN (GLUCOPHAGE) 1000 MG tablet TAKE 1 TABLET BY MOUTH 2 TIMES DAILY WITH A MEAL. 180 tablet 3  . montelukast (SINGULAIR) 10 MG tablet Take 1 tablet (10 mg total) by mouth at bedtime. 30 tablet 0  . Multiple Vitamin (MULTIVITAMIN) capsule Take 1 capsule by mouth daily.    . nitroGLYCERIN (NITROSTAT) 0.4 MG SL tablet Place 1 tablet (0.4 mg total) under the tongue every 5 (five) minutes as needed for chest pain. 20 tablet 0  . pantoprazole (PROTONIX) 40 MG tablet TAKE 1 TABLET BY MOUTH 2 TIMES DAILY. 180 tablet 3  . rOPINIRole (REQUIP) 1 MG tablet Take 1 tablet (1 mg total) by mouth at bedtime. 90 tablet 3  . sertraline (ZOLOFT) 100 MG tablet  Take 1.5 tablets (150 mg total) by mouth daily. 135 tablet 3  . traMADol (ULTRAM) 50 MG tablet Take 1 tablet (50 mg total) by mouth every 6 (six) hours as needed. 50 tablet 0  . TRUEPLUS LANCETS 30G MISC USE TO CHECK BLOOD GLUCOSE DAILY AS DIRECTED 100 each 3  . Vitamin D, Ergocalciferol, (DRISDOL) 50000 units CAPS capsule Take 1 capsule (50,000 Units total) by mouth every 7 (seven) days. 12 capsule 0  . AMBULATORY NON FORMULARY MEDICATION Reported on 10/26/2015     Current Facility-Administered Medications on File Prior to Visit  Medication Dose Route Frequency Provider Last Rate Last Dose  . technetium tetrofosmin (TC-MYOVIEW) injection 27.2 millicurie  53.6 millicurie Intravenous Once PRN Larey Dresser, MD  Allergies  Allergen Reactions  . Fish Oil Rash     Objective:  BP 128/82   Pulse 67   Temp 99.1 F (37.3 C) (Oral)   Resp 16   Ht 5' (1.524 m)   Wt 232 lb 3.2 oz (105.3 kg)   SpO2 97%   BMI 45.35 kg/m   Physical Exam  Constitutional: She is oriented to person, place, and time and well-developed, well-nourished, and in no distress. No distress.  HENT:  Right Ear: Tympanic membrane normal.  Left Ear: Tympanic membrane normal.  Nose: Mucosal edema present. No rhinorrhea. Right sinus exhibits maxillary sinus tenderness. Right sinus exhibits no frontal sinus tenderness. Left sinus exhibits no maxillary sinus tenderness and no frontal sinus tenderness.  Mouth/Throat: Oropharynx is clear and moist and mucous membranes are normal.  Cardiovascular: Normal rate, regular rhythm and normal heart sounds.  Pulmonary/Chest: Effort normal and breath sounds normal. No respiratory distress. She has no wheezes. She has no rales.  Neurological: She is alert and oriented to person, place, and time. GCS score is 15.  Skin: Skin is warm and dry.  Psychiatric: Mood, memory, affect and judgment normal.  Vitals reviewed.   Assessment and Plan :  1. Sinusitis, unspecified chronicity,  unspecified location - azithromycin (ZITHROMAX) 250 MG tablet; Take 2 tabs PO x 1 dose, then 1 tab PO QD x 4 days  Dispense: 6 tablet; Refill: 0 - pt presents for cough and sinus pressure x 3 weeks. Plan to cover with azithromycin. Stay well hydrated. RTC in 2-3 weeks if no improvement.   2. Cough - HYDROcodone-homatropine (HYCODAN) 5-1.5 MG/5ML syrup; Take 5 mLs by mouth every 8 (eight) hours as needed for cough.  Dispense: 120 mL; Refill: 0 - benzonatate (TESSALON) 100 MG capsule; Take 1-2 capsules (100-200 mg total) by mouth 3 (three) times daily as needed for cough.  Dispense: 40 capsule; Refill: 0   Mercer Pod, PA-C  [facility] 02/06/2018 1:43 PM

## 2018-02-06 NOTE — Patient Instructions (Addendum)
Take the entire course of antibiotics even if you start to feel better.  Hycodan is a hydrocodone-based cough medication. This will make you drowsy and help you sleep.  Tessalon (benzonatate) is four cough as needed during the day.  Stay well hydrated. Get lost of rest. Wash your hands often.   -Foods that can help speed recovery: honey, garlic, chicken soup, elderberries, green tea.  -Supplements that can help speed recovery: vitamin C, zinc, elderberry extract, quercetin, ginseng, selenium -Supplement with prebiotics and probiotics:   Advil or ibuprofen for pain. Do not take Aspirin.  Drink enough water and fluids to keep your urine clear or pale yellow.  For sore throat: ? Gargle with 8 oz of salt water ( tsp of salt per 1 qt of water) as often as every 1-2 hours to soothe your throat.  Gargle liquid benadryl.  Cepacol throat lozenges (if you are not at risk for choking).  For sore throat try using a honey-based tea. Use 3 teaspoons of honey with juice squeezed from half lemon. Place shaved pieces of ginger into 1/2-1 cup of water and warm over stove top. Then mix the ingredients and repeat every 4 hours as needed.  Cough Syrup Recipe: Sweet Lemon & Honey Thyme  Ingredients a handful of fresh thyme sprigs   1 pint of water (2 cups)  1/2 cup honey (raw is best, but regular will do)  1/2 lemon chopped Instructions 1. Place the lemon in the pint jar and cover with the honey. The honey will macerate the lemons and draw out liquids which taste so delicious! 2. Meanwhile, toss the thyme leaves into a saucepan and cover them with the water. 3. Bring the water to a gentle simmer and reduce it to half, about a cup of tea. 4. When the tea is reduced and cooled a bit, strain the sprigs & leaves, add it into the pint jar and stir it well. 5. Give it a shake and use a spoonful as needed. 6. Store your homemade cough syrup in the refrigerator for about a month.  What causes a cough? In  adults, common causes of a cough include: ?An infection of the airways or lungs (such as the common cold) ?Postnasal drip - Postnasal drip is when mucus from the nose drips down or flows along the back of the throat. Postnasal drip can happen when people have: .A cold .Allergies .A sinus infection - The sinuses are hollow areas in the bones of the face that open into the nose. ?Lung conditions, like asthma and chronic obstructive pulmonary disease (COPD) - Both of these conditions can make it hard to breathe. COPD is usually caused by smoking. ?Acid reflux - Acid reflux is when the acid that is normally in your stomach backs up into your esophagus (the tube that carries food from your mouth to your stomach). ?A side effect from blood pressure medicines called "ACE inhibitors" ?Smoking cigarettes  Is there anything I can do on my own to get rid of my cough? Yes. To help get rid of your cough, you can: ?Use a humidifier in your bedroom ?Use an over-the-counter cough medicine, or suck on cough drops or hard candy ?Stop smoking, if you smoke ?If you have allergies, avoid the things you are allergic to (like pollen, dust, animals, or mold) If you have acid reflux, your doctor or nurse will tell you which lifestyle changes can help reduce symptoms.     IF you received an x-ray today, you will  receive an Pharmacologist from [facility]. Please contact [facility] at [phone] with questions or concerns regarding your invoice.   IF you received labwork today, you will receive an invoice from [facility]. Please contact [facility] at [phone] with questions or concerns regarding your invoice.   Our billing staff will not be able to assist you with questions regarding bills from these companies.  You will be contacted with the lab results as soon as they are available. The fastest way to get your results is to activate your My Chart account. Instructions are located on the last page  of this paperwork. If you have not heard from [name] regarding the results in 2 weeks, please contact this office.

## 2018-02-09 ENCOUNTER — Other Ambulatory Visit: Payer: Self-pay | Admitting: Family Medicine

## 2018-02-09 DIAGNOSIS — F3289 Other specified depressive episodes: Secondary | ICD-10-CM

## 2018-02-10 MED FILL — BUPROPION HCL SR 200 MG TAB: 200 | 90 days supply | Qty: 90 | Fill #0

## 2018-02-14 ENCOUNTER — Encounter: Payer: Self-pay | Admitting: Urgent Care

## 2018-02-14 ENCOUNTER — Ambulatory Visit: Payer: 59 | Admitting: Urgent Care

## 2018-02-14 ENCOUNTER — Other Ambulatory Visit: Payer: Self-pay

## 2018-02-14 VITALS — BP 112/72 | HR 87 | Temp 98.9°F | Resp 16 | Ht 60.0 in | Wt 229.6 lb

## 2018-02-14 DIAGNOSIS — J329 Chronic sinusitis, unspecified: Secondary | ICD-10-CM | POA: Diagnosis not present

## 2018-02-14 DIAGNOSIS — H938X2 Other specified disorders of left ear: Secondary | ICD-10-CM

## 2018-02-14 DIAGNOSIS — R059 Cough, unspecified: Secondary | ICD-10-CM

## 2018-02-14 DIAGNOSIS — J3489 Other specified disorders of nose and nasal sinuses: Secondary | ICD-10-CM

## 2018-02-14 DIAGNOSIS — R05 Cough: Secondary | ICD-10-CM

## 2018-02-14 MED ORDER — FLUCONAZOLE 150 MG PO TABS: 150.0000 mg | ORAL_TABLET | ORAL | 0 refills | Status: DC

## 2018-02-14 MED ORDER — PREDNISONE 20 MG PO TABS: ORAL_TABLET | ORAL | 0 refills | Status: DC

## 2018-02-14 MED ORDER — AMOXICILLIN-POT CLAVULANATE 875-125 MG PO TABS: 1.0000 | ORAL_TABLET | Freq: Two times a day (BID) | ORAL | 0 refills | Status: DC

## 2018-02-14 NOTE — Patient Instructions (Addendum)
Hydrate well with at least 2 liters (1 gallon) of water daily. Do not take any other NSAID including diclofenac, ibuprofen, naproxen, Aleve, Motrin, etc while taking prednisone.       Sinusitis, Adult Sinusitis is soreness and inflammation of your sinuses. Sinuses are hollow spaces in the bones around your face. Your sinuses are located:  Around your eyes.  In the middle of your forehead.  Behind your nose.  In your cheekbones.  Your sinuses and nasal passages are lined with a stringy fluid (mucus). Mucus normally drains out of your sinuses. When your nasal tissues become inflamed or swollen, the mucus can become trapped or blocked so air cannot flow through your sinuses. This allows bacteria, viruses, and funguses to grow, which leads to infection. Sinusitis can develop quickly and last for 7?10 days (acute) or for more than 12 weeks (chronic). Sinusitis often develops after a cold. What are the causes? This condition is caused by anything that creates swelling in the sinuses or stops mucus from draining, including:  Allergies.  Asthma.  Bacterial or viral infection.  Abnormally shaped bones between the nasal passages.  Nasal growths that contain mucus (nasal polyps).  Narrow sinus openings.  Pollutants, such as chemicals or irritants in the air.  A foreign object stuck in the nose.  A fungal infection. This is rare.  What increases the risk? The following factors may make you more likely to develop this condition:  Having allergies or asthma.  Having had a recent cold or respiratory tract infection.  Having structural deformities or blockages in your nose or sinuses.  Having a weak immune system.  Doing a lot of swimming or diving.  Overusing nasal sprays.  Smoking.  What are the signs or symptoms? The main symptoms of this condition are pain and a feeling of pressure around the affected sinuses. Other symptoms include:  Upper  toothache.  Earache.  Headache.  Bad breath.  Decreased sense of smell and taste.  A cough that may get worse at night.  Fatigue.  Fever.  Thick drainage from your nose. The drainage is often green and it may contain pus (purulent).  Stuffy nose or congestion.  Postnasal drip. This is when extra mucus collects in the throat or back of the nose.  Swelling and warmth over the affected sinuses.  Sore throat.  Sensitivity to light.  How is this diagnosed? This condition is diagnosed based on symptoms, a medical history, and a physical exam. To find out if your condition is acute or chronic, your health care provider may:  Look in your nose for signs of nasal polyps.  Tap over the affected sinus to check for signs of infection.  View the inside of your sinuses using an imaging device that has a light attached (endoscope).  If your health care provider suspects that you have chronic sinusitis, you may also:  Be tested for allergies.  Have a sample of mucus taken from your nose (nasal culture) and checked for bacteria.  Have a mucus sample examined to see if your sinusitis is related to an allergy.  If your sinusitis does not respond to treatment and it lasts longer than 8 weeks, you may have an MRI or CT scan to check your sinuses. These scans also help to determine how severe your infection is. In rare cases, a bone biopsy may be done to rule out more serious types of fungal sinus disease. How is this treated? Treatment for sinusitis depends on the cause and  whether your condition is chronic or acute. If a virus is causing your sinusitis, your symptoms will go away on their own within 10 days. You may be given medicines to relieve your symptoms, including:  Topical nasal decongestants. They shrink swollen nasal passages and let mucus drain from your sinuses.  Antihistamines. These drugs block inflammation that is triggered by allergies. This can help to ease swelling in  your nose and sinuses.  Topical nasal corticosteroids. These are nasal sprays that ease inflammation and swelling in your nose and sinuses.  Nasal saline washes. These rinses can help to get rid of thick mucus in your nose.  If your condition is caused by bacteria, you will be given an antibiotic medicine. If your condition is caused by a fungus, you will be given an antifungal medicine. Surgery may be needed to correct underlying conditions, such as narrow nasal passages. Surgery may also be needed to remove polyps. Follow these instructions at home: Medicines  Take, use, or apply over-the-counter and prescription medicines only as told by your health care provider. These may include nasal sprays.  If you were prescribed an antibiotic medicine, take it as told by your health care provider. Do not stop taking the antibiotic even if you start to feel better. Hydrate and Humidify  Drink enough water to keep your urine clear or pale yellow. Staying hydrated will help to thin your mucus.  Use a cool mist humidifier to keep the humidity level in your home above 50%.  Inhale steam for 10-15 minutes, 3-4 times a day or as told by your health care provider. You can do this in the bathroom while a hot shower is running.  Limit your exposure to cool or dry air. Rest  Rest as much as possible.  Sleep with your head raised (elevated).  Make sure to get enough sleep each night. General instructions  Apply a warm, moist washcloth to your face 3-4 times a day or as told by your health care provider. This will help with discomfort.  Wash your hands often with soap and water to reduce your exposure to viruses and other germs. If soap and water are not available, use hand sanitizer.  Do not smoke. Avoid being around people who are smoking (secondhand smoke).  Keep all follow-up visits as told by your health care provider. This is important. Contact a health care provider if:  You have a  fever.  Your symptoms get worse.  Your symptoms do not improve within 10 days. Get help right away if:  You have a severe headache.  You have persistent vomiting.  You have pain or swelling around your face or eyes.  You have vision problems.  You develop confusion.  Your neck is stiff.  You have trouble breathing. This information is not intended to replace advice given to you by your health care provider. Make sure you discuss any questions you have with your health care provider. Document Released: 10/28/2005 Document Revised: 06/23/2016 Document Reviewed: 08/23/2015 Elsevier Interactive Patient Education  2018 [employer].     IF you received an x-ray today, you will receive an invoice from [facility]. Please contact [facility] at [phone] with questions or concerns regarding your invoice.   IF you received labwork today, you will receive an invoice from [facility]. Please contact [facility] at [phone] with questions or concerns regarding your invoice.   Our billing staff will not be able to assist you with questions regarding bills from these companies.  You  will be contacted with the lab results as soon as they are available. The fastest way to get your results is to activate your My Chart account. Instructions are located on the last page of this paperwork. If you have not heard from [name] regarding the results in 2 weeks, please contact this office.

## 2018-02-14 NOTE — Progress Notes (Signed)
    MRN: [id number] DOB: Mar 17, 1961  Subjective:   Cristina [name] is a 57 y.o. female presenting for 1 month history of persistent cough, sinus congestion, sinus pressure/pain, alternating ear fullness/pain of both ears. Denies chest pain, shob, wheezing, n/v, abdominal pain. Last OV here for the same was 02/06/2018, was started on azithromycin. She has completed this course. Has allergies, managed with Singulair, Flonase and Zyrtec. Denies smoking cigarettes. Her diabetes is controlled.   Cristina [name] has a current medication list which includes the following prescription(s): acyclovir, albuterol, AMBULATORY NON FORMULARY MEDICATION, anucort-hc, aspirin ec, atorvastatin, azelastine, azelastine, biotin, bupropion, calcium citrate-vitamin d, cetirizine, cyclobenzaprine, diclofenac sodium, diltiazem, docusate sodium, empagliflozin, ferrous sulfate, fluticasone, gabapentin, glipizide, glucosamine hcl, glucose blood, hydrocodone-acetaminophen, hydrocodone-homatropine, hydrocortisone, pen needles, liraglutide, meloxicam, metformin, montelukast, multivitamin, nitroglycerin, pantoprazole, ropinirole, sertraline, tramadol, trueplus lancets 30g, vitamin d (ergocalciferol), azithromycin, and benzonatate, and the following Facility-Administered Medications: technetium tetrofosmin. Also is allergic to fish oil.  Cristina [name]  has a past medical history of Allergic rhinitis, cause unspecified, Anemia, Arthritis, Asthma, Back pain, Chest pain, Constipation, CTS (carpal tunnel syndrome), Cystocele, Depression, Diabetes mellitus, Dysfunction of eustachian tube, Fatty liver, Fissure, anal, Gallbladder problem, Genital herpes, GERD (gastroesophageal reflux disease), Hemorrhoid, migraine headaches, Hyperlipidemia, Hypertension, IBS (irritable bowel syndrome), Insomnia, Iron deficiency anemia, unspecified, Joint pain, Nausea, Obesity, OSA on CPAP, Osteoarthritis, Rectocele, Sleep apnea, and TMJ (dislocation of temporomandibular joint). Also  has  a past surgical history that includes Cholecystectomy; Carpal tunnel release (1989); Ectopic pregnancy surgery (1993); Tubal ligation (1988); Cholecystectomy; Sleep study (09/11/2012); Abdominal hysterectomy (11/11/2010); Colonoscopy (06/29/2012); and Esophagogastroduodenoscopy (12/13/2011).  Objective:   Vitals: BP 112/72   Pulse 87   Temp 98.9 F (37.2 C)   Resp 16   Ht 5' (1.524 m)   Wt 229 lb 9.6 oz (104.1 kg)   SpO2 96%   BMI 44.84 kg/m   Physical Exam  Constitutional: She is oriented to person, place, and time. She appears well-developed and well-nourished.  HENT:  Throat with post-nasal drainage. TM's opaque bilaterally but without erythema. Right maxillary sinus tenderness.  Eyes: Right eye exhibits no discharge. Left eye exhibits no discharge.  Cardiovascular: Normal rate, regular rhythm and intact distal pulses. Exam reveals no gallop and no friction rub.  No murmur heard. Pulmonary/Chest: No respiratory distress. She has no wheezes. She has no rales.  Neurological: She is alert and oriented to person, place, and time.  Skin: Skin is warm and dry.  Psychiatric: She has a normal mood and affect.   Assessment and Plan :   Sinusitis, unspecified chronicity, unspecified location  Sinus pain  Cough  Ear fullness, left  Will start Augmentin, short steroid course. Maintain cough suppression medications. Counseled patient on potential for adverse effects with medications prescribed today, patient verbalized understanding. Return-to-clinic precautions discussed, patient verbalized understanding.   Jaynee Eagles, PA-C [facility] [phone] 02/14/2018  11:12 AM

## 2018-02-18 ENCOUNTER — Encounter: Payer: Self-pay | Admitting: Physician Assistant

## 2018-02-26 ENCOUNTER — Ambulatory Visit (INDEPENDENT_AMBULATORY_CARE_PROVIDER_SITE_OTHER): Payer: 59 | Admitting: Specialist

## 2018-02-26 ENCOUNTER — Encounter (INDEPENDENT_AMBULATORY_CARE_PROVIDER_SITE_OTHER): Payer: Self-pay | Admitting: Specialist

## 2018-02-26 VITALS — BP 110/71 | HR 98 | Ht 60.0 in | Wt 229.0 lb

## 2018-02-26 DIAGNOSIS — M170 Bilateral primary osteoarthritis of knee: Secondary | ICD-10-CM

## 2018-02-26 DIAGNOSIS — M48062 Spinal stenosis, lumbar region with neurogenic claudication: Secondary | ICD-10-CM

## 2018-02-26 DIAGNOSIS — G5601 Carpal tunnel syndrome, right upper limb: Secondary | ICD-10-CM

## 2018-02-26 DIAGNOSIS — M4316 Spondylolisthesis, lumbar region: Secondary | ICD-10-CM

## 2018-02-26 MED ORDER — METHYLPREDNISOLONE ACETATE 40 MG/ML IJ SUSP
40.0000 mg | INTRAMUSCULAR | Status: AC | PRN
  Administered 2018-02-26: 40 mg via INTRA_ARTICULAR

## 2018-02-26 MED ORDER — BUPIVACAINE HCL 0.25 % IJ SOLN
4.0000 mL | INTRAMUSCULAR | Status: AC | PRN
  Administered 2018-02-26: 4 mL via INTRA_ARTICULAR

## 2018-02-26 NOTE — Progress Notes (Signed)
Office Visit Note   Patient: Cristina [name]           Date of Birth: 09/15/1961           MRN: [id number] Visit Date: 02/26/2018              Requested by: [name], MD [address] [place], [place] [address] PCP: [name], MD   Assessment & Plan: Visit Diagnoses:  1. Bilateral primary osteoarthritis of knee   2. Carpal tunnel syndrome, right upper limb   3. Spinal stenosis of lumbar region with neurogenic claudication   4. Spondylolisthesis at L4-L5 level     Plan: The main ways of treat osteoarthritis, that are found to be success. Weight loss helps to decrease pain. Exercise is important to maintaining cartilage and thickness and strengthening. NSAIDs like motrin, tylenol, alleve are meds decreasing the inflamation. Ice is okay  In afternoon and evening and hot shower in the am  Avoid bending, stooping and avoid lifting weights greater than 10 lbs. Avoid prolong standing and walking. Avoid frequent bending and stooping  No lifting greater than 10 lbs. May use ice or moist heat for pain. Weight loss is of benefit. Handicap license is approved.  Follow-Up Instructions: Return in about 2 weeks (around 03/12/2018).   Orders:  No orders of the defined types were placed in this encounter.  No orders of the defined types were placed in this encounter.     Procedures: Large Joint Inj: L knee on 02/26/2018 10:10 AM Indications: pain Details: 25 G 1.5 in needle, anterolateral approach  Arthrogram: No  Medications: 40 mg methylPREDNISolone acetate 40 MG/ML; 4 mL bupivacaine 0.25 % Outcome: tolerated well, no immediate complications  Bandaid applied. Procedure, treatment alternatives, risks and benefits explained, specific risks discussed. Consent was given by the patient. Immediately prior to procedure a time out was called to verify the correct patient, procedure, equipment, support staff and site/side marked as required. Patient was prepped and draped in the  usual sterile fashion.       Clinical Data: No additional findings.   Subjective: Chief Complaint  Patient presents with  . Left Knee - Follow-up    57 year old female with left knee sprain in Dec and she reports pain persists in the left knee, with stiffness with sitting for lengths of time and with first walking in the morning. There is pain in the back of both knees. Pain with attempts at squatting and kneeling. No numbness about the knees except with with numbness and both hips and buttocks  Hurt with walking. Limited in her walking but also remains independent and assists her daughter who had a BKA for diabetes and ulcers. She has diabetes and does not  Control her sugar. She does want MRI of the left knee but is concerned about the expense. Her radiographs show primary osteoarthritis of the left knee .    Review of Systems  Constitutional: Negative.   HENT: Positive for rhinorrhea, sinus pressure, sinus pain and sneezing. Negative for congestion.   Eyes: Negative.   Respiratory: Negative.   Cardiovascular: Negative.   Gastrointestinal: Negative.   Endocrine: Negative.   Genitourinary: Negative.   Musculoskeletal: Negative.   Skin: Negative.   Allergic/Immunologic: Negative.   Neurological: Negative.   Hematological: Negative.   Psychiatric/Behavioral: Negative.      Objective: Vital Signs: BP 110/71 (BP Location: Left Arm, Patient Position: Sitting)   Pulse 98   Ht 5' (1.524 m)  Wt 229 lb (103.9 kg)   BMI 44.72 kg/m   Physical Exam  Constitutional: She is oriented to person, place, and time. She appears well-developed and well-nourished.  HENT:  Head: Normocephalic and atraumatic.  Eyes: Pupils are equal, round, and reactive to light. EOM are normal.  Neck: Normal range of motion. Neck supple.  Pulmonary/Chest: Effort normal and breath sounds normal.  Abdominal: Soft. Bowel sounds are normal.  Neurological: She is alert and oriented to person, place, and  time.  Skin: Skin is warm and dry.  Psychiatric: She has a normal mood and affect. Her behavior is normal. Judgment and thought content normal.    Left Knee Exam   Muscle Strength  The patient has normal left knee strength.  Tenderness  The patient is experiencing tenderness in the medial joint line.  Range of Motion  Extension:  -10 abnormal  Flexion: 120   Tests  McMurray:  Medial - positive Lateral - positive Varus: positive  Lachman:  Anterior - negative    Posterior - negative Drawer:  Anterior - negative     Posterior - negative Pivot shift: negative Patellar apprehension: negative  Other  Erythema: absent Scars: absent      Specialty Comments:  No specialty comments available.  Imaging: No results found.   PMFS History: Patient Active Problem List   Diagnosis Date Noted  . Type 2 diabetes mellitus without complication, without long-term current use of insulin ([place]) 05/22/2017  . Class 3 obesity with serious comorbidity and body mass index (BMI) of 40.0 to 44.9 in adult 05/21/2017  . Family history of colon cancer 04/01/2017  . Irritable bowel syndrome with diarrhea 04/01/2017  . NSAID long-term use 04/01/2017  . Lymphocytosis 02/24/2017  . Essential hypertension 02/12/2017  . Mild intermittent asthma with acute exacerbation 12/12/2016  . Left shoulder pain 06/02/2015  . Insomnia 05/12/2014  . Fibromyalgia 05/12/2014  . Allergic rhinitis 03/28/2014  . Osteoarthritis 12/20/2013  . Cervical strain 06/21/2013  . Low back pain 06/21/2013  . Metatarsalgia of both feet 03/11/2013  . Sciatica 01/25/2013  . Restless leg syndrome 11/24/2012  . Pure hypercholesterolemia 10/20/2012  . OSA on CPAP 10/20/2012  . Depression 10/20/2012  . Iron deficiency anemia, unspecified 10/01/2012  . Diabetes mellitus, type 2 ([place]) 12/31/2010  . ANAL FISSURE 12/31/2010  . Morbid obesity ([place]) 01/08/2007  . CONSTIPATION 01/08/2007   Past Medical History:  Diagnosis  Date  . Allergic rhinitis, cause unspecified   . Anemia   . Arthritis   . Asthma   . Back pain   . Chest pain   . Constipation   . CTS (carpal tunnel syndrome)   . Cystocele   . Depression   . Diabetes mellitus   . Dysfunction of eustachian tube   . Fatty liver   . Fissure, anal   . Gallbladder problem   . Genital herpes   . GERD (gastroesophageal reflux disease)   . Hemorrhoid   . Hx of migraine headaches   . Hyperlipidemia   . Hypertension   . IBS (irritable bowel syndrome)   . Insomnia   . Iron deficiency anemia, unspecified   . Joint pain   . Nausea   . Obesity   . OSA on CPAP   . Osteoarthritis   . Rectocele   . Sleep apnea   . TMJ (dislocation of temporomandibular joint)     Family History  Problem Relation Age of Onset  . Hypertension Mother 88  . Osteoarthritis Mother   .  Diabetes Mother   . Colon cancer Mother 62  . Cancer Mother 92       colon cancer  . Hyperlipidemia Mother   . Obesity Mother   . Sudden death Mother   . Arthritis Mother   . Colon cancer Paternal Grandmother   . Cancer Paternal Grandmother        colon cancer  . Stroke Maternal Grandmother   . Colon cancer Maternal Grandmother   . Colon polyps Brother        x 2 brother  . Diabetes Daughter   . Hypertension Daughter   . Sleep apnea Daughter   . Mental illness Daughter        depression  . Migraines Daughter   . Cirrhosis Brother        alcoholism  . Alcohol abuse Brother   . Migraines Daughter   . Mental illness Daughter        anxiety attacks  . Protein S deficiency Daughter   . Sleep apnea Brother   . Hyperlipidemia Brother   . Migraines Brother   . Obstructive Sleep Apnea Brother   . Colon cancer Paternal Aunt     Past Surgical History:  Procedure Laterality Date  . ABDOMINAL HYSTERECTOMY  11/11/2010   [place] Plan.  Ovaries intact.  Uterine fibroids with DUB.  [name] CARPAL TUNNEL RELEASE  1989   Left  . CHOLECYSTECTOMY    . CHOLECYSTECTOMY    . COLONOSCOPY   06/29/2012   normal.  Repeat in 5 years.  [name] [place]  . ESOPHAGOGASTRODUODENOSCOPY  12/13/2011   dysphagia.  Henrene Pastor.  Normal.  . Sleep study  09/11/2012   severe OSA.  CPAP titration at 12 cm water pressure.  . TUBAL LIGATION  1988   Social History   Occupational History  . Occupation: Audiological scientist: [employer]    Comment: [employer]  Tobacco Use  . Smoking status: Former Research scientist (life sciences)  . Smokeless tobacco: Never Used  . Tobacco comment: smoked occasionally longest 6 mos.  Substance and Sexual Activity  . Alcohol use: No    Alcohol/week: 0.6 oz    Types: 1 Glasses of wine per week  . Drug use: No  . Sexual activity: Never    Birth control/protection: None

## 2018-02-26 NOTE — Patient Instructions (Signed)
The main ways of treat osteoarthritis, that are found to be success. Weight loss helps to decrease pain. Exercise is important to maintaining cartilage and thickness and strengthening. NSAIDs like motrin, tylenol, alleve are meds decreasing the inflamation. Ice is okay  In afternoon and evening and hot shower in the am  Avoid bending, stooping and avoid lifting weights greater than 10 lbs. Avoid prolong standing and walking. Avoid frequent bending and stooping  No lifting greater than 10 lbs. May use ice or moist heat for pain. Weight loss is of benefit. Handicap license is approved.

## 2018-03-03 DIAGNOSIS — K219 Gastro-esophageal reflux disease without esophagitis: Secondary | ICD-10-CM | POA: Diagnosis not present

## 2018-03-03 DIAGNOSIS — G4733 Obstructive sleep apnea (adult) (pediatric): Secondary | ICD-10-CM | POA: Diagnosis not present

## 2018-03-03 DIAGNOSIS — K7581 Nonalcoholic steatohepatitis (NASH): Secondary | ICD-10-CM | POA: Diagnosis not present

## 2018-03-03 DIAGNOSIS — M170 Bilateral primary osteoarthritis of knee: Secondary | ICD-10-CM | POA: Diagnosis not present

## 2018-03-03 DIAGNOSIS — E119 Type 2 diabetes mellitus without complications: Secondary | ICD-10-CM | POA: Diagnosis not present

## 2018-03-04 ENCOUNTER — Other Ambulatory Visit: Payer: Self-pay | Admitting: Family Medicine

## 2018-03-04 DIAGNOSIS — Z1231 Encounter for screening mammogram for malignant neoplasm of breast: Secondary | ICD-10-CM

## 2018-03-12 MED FILL — rOPINIRole HCL 1 MG TABS: 1 | 90 days supply | Qty: 90 | Fill #3

## 2018-03-12 MED FILL — PANTOPRAZOLE SOD DR 40 MG T: 40 | 90 days supply | Qty: 180 | Fill #2

## 2018-03-12 MED FILL — SERTRALINE HCL 100 MG TAB: 100 | 90 days supply | Qty: 135 | Fill #3

## 2018-03-12 MED FILL — MONTELUKAST SOD 10 MG TAB: 10 | 90 days supply | Qty: 90 | Fill #2

## 2018-03-18 ENCOUNTER — Ambulatory Visit (INDEPENDENT_AMBULATORY_CARE_PROVIDER_SITE_OTHER): Payer: 59 | Admitting: Specialist

## 2018-03-25 MED FILL — FLUTICASONE PROP 50 MCG SPR: 50 | 90 days supply | Qty: 48 | Fill #0

## 2018-03-30 ENCOUNTER — Ambulatory Visit: Payer: 59

## 2018-04-01 ENCOUNTER — Encounter: Payer: Self-pay | Admitting: Family Medicine

## 2018-04-01 ENCOUNTER — Ambulatory Visit: Payer: 59 | Admitting: Family Medicine

## 2018-04-01 VITALS — BP 116/78 | HR 96 | Temp 99.2°F | Resp 16 | Ht 60.0 in | Wt 230.0 lb

## 2018-04-01 DIAGNOSIS — G4733 Obstructive sleep apnea (adult) (pediatric): Secondary | ICD-10-CM

## 2018-04-01 DIAGNOSIS — J301 Allergic rhinitis due to pollen: Secondary | ICD-10-CM | POA: Diagnosis not present

## 2018-04-01 DIAGNOSIS — E1142 Type 2 diabetes mellitus with diabetic polyneuropathy: Secondary | ICD-10-CM | POA: Diagnosis not present

## 2018-04-01 DIAGNOSIS — Z9989 Dependence on other enabling machines and devices: Secondary | ICD-10-CM | POA: Diagnosis not present

## 2018-04-01 DIAGNOSIS — F3289 Other specified depressive episodes: Secondary | ICD-10-CM

## 2018-04-01 DIAGNOSIS — I10 Essential (primary) hypertension: Secondary | ICD-10-CM

## 2018-04-01 DIAGNOSIS — E7800 Pure hypercholesterolemia, unspecified: Secondary | ICD-10-CM

## 2018-04-01 DIAGNOSIS — J4521 Mild intermittent asthma with (acute) exacerbation: Secondary | ICD-10-CM

## 2018-04-01 DIAGNOSIS — S1096XA Insect bite of unspecified part of neck, initial encounter: Secondary | ICD-10-CM

## 2018-04-01 DIAGNOSIS — E119 Type 2 diabetes mellitus without complications: Secondary | ICD-10-CM

## 2018-04-01 DIAGNOSIS — W57XXXA Bitten or stung by nonvenomous insect and other nonvenomous arthropods, initial encounter: Secondary | ICD-10-CM

## 2018-04-01 LAB — POCT GLYCOSYLATED HEMOGLOBIN (HGB A1C): Hemoglobin A1C: 8.4 % — AB (ref 4.0–5.6)

## 2018-04-01 MED ORDER — GLUCOSE BLOOD VI STRP: ORAL_STRIP | 1 refills | Status: DC

## 2018-04-01 MED ORDER — PANTOPRAZOLE SODIUM 40 MG PO TBEC: DELAYED_RELEASE_TABLET | ORAL | 3 refills | Status: DC

## 2018-04-01 MED ORDER — METFORMIN HCL 1000 MG PO TABS: ORAL_TABLET | ORAL | 3 refills | Status: DC

## 2018-04-01 MED ORDER — HYDROCORTISONE 2.5 % EX OINT: TOPICAL_OINTMENT | Freq: Two times a day (BID) | CUTANEOUS | 0 refills | Status: DC

## 2018-04-01 MED ORDER — SERTRALINE HCL 100 MG PO TABS: 150.0000 mg | ORAL_TABLET | Freq: Every day | ORAL | 3 refills | Status: DC

## 2018-04-01 MED ORDER — PEN NEEDLES 31G X 5 MM MISC: 1.0000 | Freq: Every day | 3 refills | Status: DC

## 2018-04-01 MED ORDER — GLIPIZIDE ER 10 MG PO TB24: ORAL_TABLET | ORAL | 3 refills | Status: DC

## 2018-04-01 MED ORDER — FLUTICASONE PROPIONATE 50 MCG/ACT NA SUSP: NASAL | 3 refills | Status: DC

## 2018-04-01 MED ORDER — ROPINIROLE HCL 1 MG PO TABS: 1.0000 mg | ORAL_TABLET | Freq: Every day | ORAL | 3 refills | Status: DC

## 2018-04-01 MED ORDER — ATORVASTATIN CALCIUM 20 MG PO TABS: 20.0000 mg | ORAL_TABLET | Freq: Every day | ORAL | 3 refills | Status: DC

## 2018-04-01 MED ORDER — BUPROPION HCL ER (SR) 200 MG PO TB12: 200.0000 mg | ORAL_TABLET | Freq: Every day | ORAL | 3 refills | Status: DC

## 2018-04-01 MED ORDER — GABAPENTIN 300 MG PO CAPS: 300.0000 mg | ORAL_CAPSULE | Freq: Three times a day (TID) | ORAL | 1 refills | Status: DC

## 2018-04-01 MED FILL — ACCU-CHEK GUIDE TEST STRIP: 90 days supply | Qty: 100 | Fill #0

## 2018-04-01 MED FILL — UNIFINE PENTIPS 31GX3/16: 31G X 5 MM | 90 days supply | Qty: 100 | Fill #0

## 2018-04-01 MED FILL — metFORMIN HCL 1000 MG TABS: 1000 | 90 days supply | Qty: 180 | Fill #0

## 2018-04-01 MED FILL — HYDROCORTISONE 2.5% OINT: 2.5 | 15 days supply | Qty: 28 | Fill #0

## 2018-04-01 MED FILL — UNIFINE PENTIPS 31GX3/16": 31G X 5 MM | 90 days supply | Qty: 100 | Fill #0

## 2018-04-01 MED FILL — glipiZIDE ER 10 MG TB24: 10 | 90 days supply | Qty: 90 | Fill #0

## 2018-04-01 NOTE — Progress Notes (Signed)
Subjective:    Patient ID: Cristina [name], female    DOB: 07/04/1961, 57 y.o.   MRN: [id number]  04/01/2018  Chronic Conditions (3 month follow-up )    HPI This 57 y.o. female presents for THREE MONTH FOLLOW-UP of DMII, hypertension, hypercholesterolemia, anxiety/depression.  Management changes made at last visit include the following:  Diabetes mellitus type 2 uncontrolled with a hemoglobin A1c of 8.1.  Patient currently maintained on Invokana 300 mg daily, glipizide 10 mg daily, metformin 2000 mg daily, Victoza 1.8 mg daily.  Next progression in therapy is insulin.  Highly recommend weight loss, exercise, and a low carbohydrate low sugar diet.  Encourage regular exercise.  Patient has stationary bike at home and can exercise regularly.  Recent noncompliance with dietary modification.  Recommend avoiding fast food.  Recommend patient prepare meals from home to avoid excessive caloric intake. Hypertension well controlled on current regimen.  Obtain labs for chronic disease management.  No changes in therapy. Hypercholesterolemia controlled on current regimen.  Obtain labs for chronic disease management. Fibromyalgia: Chronic and interferes with activity level.  Continue meloxicam, Zoloft, gabapentin, flexeril, wellbutrin as prescribed.   Morbid Obesity: ecommend weight loss, exercise for 30-60 minutes five days per week; recommend 1200 kcal restriction per day with a minimum of 60 grams of protein per day. S/p appointment with Dr. Louanne Skye of orthopedics on 12/17/17, 11/12/08, 10/29/17, 10/15/18 for tear of MCL and knee osteoarthritis and  Carpal tunnel syndrome.  S/p physical therapy on 12/15/17, 12/04/17 for L sciatica   UPDATE:  Coughing in morning; small amounts in the morning.  L ear pops and hurts.  Stopped up.  Occurs intermittently.  Taking everything.  Evaluated in 01/2018; does have Astelin but not taking regularly.  Ran out of Zyrtec.   Not sleeping well.   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  Admitted at [facility]; PICC line; three abx via PICC line.  Checking on her regularly.  Taking back and forth to [facility]. Plans to have bariatric surgery in July middle with [facility].    BP Readings from Last 3 Encounters:  04/01/18 116/78  02/26/18 110/71  02/14/18 112/72   Wt Readings from Last 3 Encounters:  04/01/18 230 lb (104.3 kg)  02/26/18 229 lb (103.9 kg)  02/14/18 229 lb 9.6 oz (104.1 kg)   Immunization History  Administered Date(s) Administered  . Hepatitis B 11/12/1999  . Influenza Split 11/11/2010, 08/14/2012, 08/25/2014  . Influenza-Unspecified 07/12/2016, 08/01/2017  . Pneumococcal Polysaccharide-23 11/12/2007, 05/04/2016  . Td 11/11/1998  . Tdap 02/22/2010    Review of Systems  Constitutional: Negative for activity change, appetite change, chills, diaphoresis, fatigue, fever and unexpected weight change.  HENT: Positive for congestion, ear pain, postnasal drip and rhinorrhea. Negative for dental problem, drooling, ear discharge, facial swelling, hearing loss, mouth sores, nosebleeds, sinus pressure, sinus pain, sneezing, sore throat, tinnitus, trouble swallowing and voice change.   Eyes: Negative for photophobia, pain, discharge, redness, itching and visual disturbance.  Respiratory: Positive for cough. Negative for apnea, choking, chest tightness, shortness of breath, wheezing and stridor.   Cardiovascular: Negative for chest pain, palpitations and leg swelling.  Gastrointestinal: Negative for abdominal distention, abdominal pain, anal bleeding, blood in stool, constipation, diarrhea, nausea, rectal pain and vomiting.  Endocrine: Negative for cold intolerance, heat intolerance, polydipsia, polyphagia and polyuria.  Genitourinary: Negative for decreased urine volume, difficulty urinating, dyspareunia, dysuria, enuresis, flank pain, frequency, genital sores, hematuria, menstrual problem, pelvic pain, urgency, vaginal bleeding, vaginal  discharge and  vaginal pain.  Musculoskeletal: Negative for arthralgias, back pain, gait problem, joint swelling, myalgias, neck pain and neck stiffness.  Skin: Negative for color change, pallor, rash and wound.  Allergic/Immunologic: Negative for environmental allergies, food allergies and immunocompromised state.  Neurological: Negative for dizziness, tremors, seizures, syncope, facial asymmetry, speech difficulty, weakness, light-headedness, numbness and headaches.  Hematological: Negative for adenopathy. Does not bruise/bleed easily.  Psychiatric/Behavioral: Positive for sleep disturbance. Negative for agitation, behavioral problems, confusion, decreased concentration, dysphoric mood, hallucinations, self-injury and suicidal ideas. The patient is not nervous/anxious and is not hyperactive.     Past Medical History:  Diagnosis Date  . Allergic rhinitis, cause unspecified   . Anemia   . Arthritis   . Asthma   . Back pain   . Chest pain   . Constipation   . CTS (carpal tunnel syndrome)   . Cystocele   . Depression   . Diabetes mellitus   . Dysfunction of eustachian tube   . Fatty liver   . Fissure, anal   . Gallbladder problem   . Genital herpes   . GERD (gastroesophageal reflux disease)   . Hemorrhoid   . Hx of migraine headaches   . Hyperlipidemia   . Hypertension   . IBS (irritable bowel syndrome)   . Insomnia   . Iron deficiency anemia, unspecified   . Joint pain   . Nausea   . Obesity   . OSA on CPAP   . Osteoarthritis   . Rectocele   . Sleep apnea   . TMJ (dislocation of temporomandibular joint)    Past Surgical History:  Procedure Laterality Date  . ABDOMINAL HYSTERECTOMY  11/11/2010   Marvel Plan.  Ovaries intact.  Uterine fibroids with DUB.  [name] CARPAL TUNNEL RELEASE  1989   Left  . CHOLECYSTECTOMY    . CHOLECYSTECTOMY    . COLONOSCOPY  06/29/2012   normal.  Repeat in 5 years.  [name] [place]  . ESOPHAGOGASTRODUODENOSCOPY  12/13/2011   dysphagia.  Henrene Pastor.   Normal.  . Sleep study  09/11/2012   severe OSA.  CPAP titration at 12 cm water pressure.  . TUBAL LIGATION  1988   Allergies  Allergen Reactions  . Fish Oil Rash   Current Outpatient Medications on File Prior to Visit  Medication Sig Dispense Refill  . acyclovir (ZOVIRAX) 400 MG tablet TAKE 1 TABLET BY MOUTH 2 TIMES DAILY. 180 tablet 3  . albuterol (PROAIR HFA) 108 (90 Base) MCG/ACT inhaler Inhale 2 puffs into the lungs every 6 (six) hours as needed. 18 g 1  . AMBULATORY NON FORMULARY MEDICATION Reported on 10/26/2015    . ANUCORT-HC 25 MG suppository PLACE 1 SUPPOSITORY RECTALLY 2 TIMES DAILY AS NEEDED FOR HEMORRHOIDS. 25 suppository 5  . aspirin EC 81 MG tablet Take 81 mg by mouth daily.    [name] azelastine (ASTELIN) 0.1 % nasal spray PLACE 2 SPRAYS INTO BOTH NOSTRILS 2 TIMES DAILY AS DIRECTED 90 mL 3  . azelastine (OPTIVAR) 0.05 % ophthalmic solution Place 1 drop into both eyes daily. PRN  3  . BIOTIN 5000 PO Take by mouth.    . Calcium Carbonate-Vitamin D (CALCIUM + D PO) Take 1 tablet by mouth daily.    . cetirizine (ZYRTEC) 10 MG tablet Take 10 mg by mouth daily.    . cyclobenzaprine (FLEXERIL) 10 MG tablet Take 0.5-1 tablets (5-10 mg total) by mouth 3 (three) times daily as needed. 30 tablet 0  . diclofenac  sodium (VOLTAREN) 1 % GEL Apply 4 g topically 4 (four) times daily. 5 Tube 3  . diltiazem 2 % GEL Apply 1 application topically 3 (three) times daily. 1 Package 3  . empagliflozin (JARDIANCE) 25 MG TABS tablet Take 25 mg by mouth daily. 90 tablet 3  . ferrous sulfate 325 (65 FE) MG EC tablet Take 325 mg by mouth daily.     . Glucosamine HCl 1000 MG TABS Take 1,000 mg by mouth 2 (two) times daily with a meal.     . liraglutide (VICTOZA) 18 MG/3ML SOPN Inject 0.3 mLs (1.8 mg total) into the skin daily. 6 mL 11  . montelukast (SINGULAIR) 10 MG tablet Take 1 tablet (10 mg total) by mouth at bedtime. 30 tablet 0  . Multiple Vitamin (MULTIVITAMIN) capsule Take 1 capsule by mouth daily.     . nitroGLYCERIN (NITROSTAT) 0.4 MG SL tablet Place 1 tablet (0.4 mg total) under the tongue every 5 (five) minutes as needed for chest pain. 20 tablet 0  . traMADol (ULTRAM) 50 MG tablet Take 1 tablet (50 mg total) by mouth every 6 (six) hours as needed. 50 tablet 0  . TRUEPLUS LANCETS 30G MISC USE TO CHECK BLOOD GLUCOSE DAILY AS DIRECTED 100 each 3  . Vitamin D, Ergocalciferol, (DRISDOL) 50000 units CAPS capsule Take 1 capsule (50,000 Units total) by mouth every 7 (seven) days. 12 capsule 0   No current facility-administered medications on file prior to visit.    Social History   Socioeconomic History  . Marital status: Single    Spouse name: n/a  . Number of children: 3  . Years of education: college  . Highest education level: Not on file  Occupational History  . Occupation: Audiological scientist: [employer]    Comment: [place]  Social Needs  . Financial resource strain: Not on file  . Food insecurity:    Worry: Not on file    Inability: Not on file  . Transportation needs:    Medical: Not on file    Non-medical: Not on file  Tobacco Use  . Smoking status: Former Research scientist (life sciences)  . Smokeless tobacco: Never Used  . Tobacco comment: smoked occasionally longest 6 mos.  Substance and Sexual Activity  . Alcohol use: No    Alcohol/week: 0.6 oz    Types: 1 Glasses of wine per week  . Drug use: No  . Sexual activity: Never    Birth control/protection: None  Lifestyle  . Physical activity:    Days per week: Not on file    Minutes per session: Not on file  . Stress: Not on file  Relationships  . Social connections:    Talks on phone: Not on file    Gets together: Not on file    Attends religious service: Not on file    Active member of club or organization: Not on file    Attends meetings of clubs or organizations: Not on file    Relationship status: Not on file  . Intimate partner violence:    Fear of current or ex partner: Not on file     Emotionally abused: Not on file    Physically abused: Not on file    Forced sexual activity: Not on file  Other Topics Concern  . Not on file  Social History Narrative   Marital status:  Divorced in 2000 after six years of marriage; not dating in 2017.      Children:  3 daughters (38, 68, 70); 6 grandchildren.      Lives: alone.  2 dogs; grandson stays some.      Employment:  Working at [employer]. 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      Tobacco: none      Alcohol: socially; special occasions.  Rare.      Drugs: none      Exercise:  none      Sexual activity:  Not sexually active since 2011.       Guns:  No guns in the home.       Smoke detectors in use,       Seatbelt:  Sometimes uses seat belts. 75% of time.  No texting while driving.     Family History  Problem Relation Age of Onset  . Hypertension Mother 72  . Osteoarthritis Mother   . Diabetes Mother   . Colon cancer Mother 42  . Cancer Mother 73       colon cancer  . Hyperlipidemia Mother   . Obesity Mother   . Sudden death Mother   . Arthritis Mother   . Colon cancer Paternal Grandmother   . Cancer Paternal Grandmother        colon cancer  . Stroke Maternal Grandmother   . Colon cancer Maternal Grandmother   . Colon polyps Brother        x 2 brother  . Diabetes Daughter   . Hypertension Daughter   . Sleep apnea Daughter   . Mental illness Daughter        depression  . Migraines Daughter   . Cirrhosis Brother        alcoholism  . Alcohol abuse Brother   . Migraines Daughter   . Mental illness Daughter        anxiety attacks  . Protein S deficiency Daughter   . Sleep apnea Brother   . Hyperlipidemia Brother   . Migraines Brother   . Obstructive Sleep Apnea Brother   . Colon cancer Paternal Aunt        Objective:    BP 116/78 (BP Location: Left Arm, Patient Position: Sitting, Cuff Size: Large)   Pulse 96   Temp 99.2 F (37.3 C) (Oral)   Resp 16   Ht 5' (1.524 m)   Wt 230 lb (104.3 kg)   SpO2 96%    BMI 44.92 kg/m  Physical Exam  Constitutional: She is oriented to person, place, and time. She appears well-developed and well-nourished. No distress.  HENT:  Head: Normocephalic and atraumatic.  Right Ear: Tympanic membrane, external ear and ear canal normal.  Left Ear: Tympanic membrane, external ear and ear canal normal.  Nose: Right sinus exhibits maxillary sinus tenderness. Left sinus exhibits maxillary sinus tenderness.  Mouth/Throat: Uvula is midline, oropharynx is clear and moist and mucous membranes are normal. No oropharyngeal exudate.  Eyes: Pupils are equal, round, and reactive to light. Conjunctivae and EOM are normal.  Neck: Normal range of motion. Neck supple. Carotid bruit is not present. No thyromegaly present.  Cardiovascular: Normal rate, regular rhythm, normal heart sounds and intact distal pulses. Exam reveals no gallop and no friction rub.  No murmur heard. Pulmonary/Chest: Effort normal and breath sounds normal. She has no wheezes. She has no rales.  Abdominal: Soft. Bowel sounds are normal. She exhibits no distension and no mass. There is no tenderness. There is no rebound and no guarding.  Lymphadenopathy:    She has no cervical adenopathy.  Neurological: She  is alert and oriented to person, place, and time. No cranial nerve deficit.  Skin: Skin is warm and dry. No rash noted. She is not diaphoretic. No erythema. No pallor.  Posterior neck LEFT with localized 9mm erythematous maculopapular lesion.  Psychiatric: She has a normal mood and affect. Her behavior is normal.   No results found. Depression screen [name] 2/9 04/01/2018 02/06/2018 12/31/2017 12/22/2017 10/13/2017  Decreased Interest 0 0 1 0 0  Down, Depressed, Hopeless 0 0 1 0 1  PHQ - 2 Score 0 0 2 0 1  Altered sleeping 0 - 1 - -  Tired, decreased energy 0 - 0 - -  Change in appetite 0 - - - -  Feeling bad or failure about yourself  0 - 0 - -  Trouble concentrating 0 - 0 - -  Moving slowly or  fidgety/restless 0 - 0 - -  Suicidal thoughts 0 - 0 - -  PHQ-9 Score 0 - 3 - -  Difficult doing work/chores - - - - -  Some recent data might be hidden   Fall Risk  02/14/2018 02/06/2018 12/31/2017 12/22/2017 10/13/2017  Falls in the past year? No Yes No Yes No  Number falls in past yr: - - - 1 -  Injury with Fall? - - - Yes -  Comment - - - - -  Risk for fall due to : - - - - -        Assessment & Plan:   1. Essential hypertension   2. Seasonal allergic rhinitis due to pollen   3. Type 2 diabetes mellitus with diabetic polyneuropathy, without long-term current use of insulin ([place])   4. Type 2 diabetes mellitus without complication, without long-term current use of insulin ([facility])   5. Other depression   6. Pure hypercholesterolemia   7. Mild intermittent asthma with acute exacerbation   8. OSA on CPAP   9. Insect bite of neck with local reaction, initial encounter     Insect bite of neck with local reaction: New.  Prescription for hydrocortisone cream provided.  Recommend Zyrtec daily.  Return to clinic for fever, increasing pain, increasing swelling.  Diabetes mellitus type 2: Uncontrolled with hemoglobin A1c of 8.4.  Plans to undergo bariatric surgery this summer which will significantly improve sugar readings.  I highly recommend weight loss, exercise, low sugar and low carbohydrate food choices.  Hypertension and hypercholesterolemia: Well-controlled at this time.  Obtain labs.  No changes to management.  Allergic rhinitis with asthma: Acute exacerbation of allergies and asthma at this time.  Can a Nettie pot daily.  Continue current asthma and allergy regimen.  Depression with anxiety: With acute stressors due to daughter's decline in health and requirement of leg amputation from diabetes complications.  Coping relatively well with the stressors.  Counseling provided during  Morbid obesity with BMI of 46: Patient undergoing processed to be evaluated for bariatric surgery.  I am  highly supportive of this decision by patient due to her multiple comorbidities that are all related to weight.  Orders Placed This Encounter  Procedures  . CBC with Differential/Platelet  . Comprehensive metabolic panel    Order Specific Question:   Has the patient fasted?    Answer:   No  . Lipid panel    Order Specific Question:   Has the patient fasted?    Answer:   No  . POCT glycosylated hemoglobin (Hb A1C)   Meds ordered this encounter  Medications  . sertraline (  ZOLOFT) 100 MG tablet    Sig: Take 1.5 tablets (150 mg total) by mouth daily.    Dispense:  135 tablet    Refill:  3  . rOPINIRole (REQUIP) 1 MG tablet    Sig: Take 1 tablet (1 mg total) by mouth at bedtime.    Dispense:  90 tablet    Refill:  3  . pantoprazole (PROTONIX) 40 MG tablet    Sig: TAKE 1 TABLET BY MOUTH 2 TIMES DAILY.    Dispense:  180 tablet    Refill:  3  . metFORMIN (GLUCOPHAGE) 1000 MG tablet    Sig: TAKE 1 TABLET BY MOUTH 2 TIMES DAILY WITH A MEAL.    Dispense:  180 tablet    Refill:  3  . Insulin Pen Needle (PEN NEEDLES) 31G X 5 MM MISC    Sig: 1 each by Does not apply route daily. USE TO INJECT VICTOZA DAILY AS DIRECTED    Dispense:  100 each    Refill:  3    FILL WITH ANY PEN NEEDLE FOR USE WITH VICTOZA PENS  . hydrocortisone 2.5 % ointment    Sig: Apply topically 2 (two) times daily.    Dispense:  30 g    Refill:  0  . glucose blood test strip    Sig: accucheck guide strips and fast clix lancets Use as instructed dm 2    Dispense:  100 each    Refill:  1  . glipiZIDE (GLIPIZIDE XL) 10 MG 24 hr tablet    Sig: TAKE 1 TABLET BY MOUTH DAILY WITH BREAKFAST.    Dispense:  90 tablet    Refill:  3  . gabapentin (NEURONTIN) 300 MG capsule    Sig: Take 1 capsule (300 mg total) by mouth 3 (three) times daily.    Dispense:  270 capsule    Refill:  1  . fluticasone (FLONASE) 50 MCG/ACT nasal spray    Sig: PLACE 2 SPRAYS INTO BOTH NOSTRILS DAILY.    Dispense:  48 g    Refill:  3    USE  THIS 90 DAY SUPPLY TO REPLACE RX SENT TODAY FOR 30 DAY SUPPLY.  [name] buPROPion (WELLBUTRIN SR) 200 MG 12 hr tablet    Sig: Take 1 tablet (200 mg total) by mouth daily.    Dispense:  90 tablet    Refill:  3  . atorvastatin (LIPITOR) 20 MG tablet    Sig: Take 1 tablet (20 mg total) by mouth daily.    Dispense:  90 tablet    Refill:  3    No follow-ups on file.   Cristina [name], M.D. Primary Care at [facility] [address] [place], [place]  [address] [phone] phone [phone] fax

## 2018-04-01 NOTE — Patient Instructions (Addendum)
   NETTIE POT DAILY.    IF you received an x-ray today, you will receive an invoice from [facility]. Please contact [facility] at [phone] with questions or concerns regarding your invoice.   IF you received labwork today, you will receive an invoice from [place]. Please contact [facility] at [phone] with questions or concerns regarding your invoice.   Our billing staff will not be able to assist you with questions regarding bills from these companies.  You will be contacted with the lab results as soon as they are available. The fastest way to get your results is to activate your My Chart account. Instructions are located on the last page of this paperwork. If you have not heard from [name] regarding the results in 2 weeks, please contact this office.

## 2018-04-02 LAB — LIPID PANEL
Chol/HDL Ratio: 4.7 ratio — ABNORMAL HIGH (ref 0.0–4.4)
Cholesterol, Total: 186 mg/dL (ref 100–199)
HDL: 40 mg/dL (ref >39)
LDL Calculated: 110 mg/dL — ABNORMAL HIGH (ref 0–99)
TRIGLYCERIDES: 182 mg/dL — AB (ref 0–149)
VLDL Cholesterol Cal: 36 mg/dL (ref 5–40)

## 2018-04-02 LAB — CBC WITH DIFFERENTIAL/PLATELET
Basophils Absolute: 0 10*3/uL (ref 0.0–0.2)
Basos: 0 %
EOS (ABSOLUTE): 0.2 10*3/uL (ref 0.0–0.4)
EOS: 1 %
HEMATOCRIT: 40.5 % (ref 34.0–46.6)
HEMOGLOBIN: 13.4 g/dL (ref 11.1–15.9)
Immature Grans (Abs): 0 10*3/uL (ref 0.0–0.1)
Immature Granulocytes: 0 %
LYMPHS ABS: 4.2 10*3/uL — AB (ref 0.7–3.1)
Lymphs: 38 %
MCH: 27.3 pg (ref 26.6–33.0)
MCHC: 33.1 g/dL (ref 31.5–35.7)
MCV: 83 fL (ref 79–97)
MONOCYTES: 5 %
Monocytes Absolute: 0.6 10*3/uL (ref 0.1–0.9)
NEUTROS ABS: 6.2 10*3/uL (ref 1.4–7.0)
Neutrophils: 56 %
Platelets: 304 10*3/uL (ref 150–450)
RBC: 4.91 x10E6/uL (ref 3.77–5.28)
RDW: 15.8 % — AB (ref 12.3–15.4)
WBC: 11.2 10*3/uL — AB (ref 3.4–10.8)

## 2018-04-02 LAB — COMPREHENSIVE METABOLIC PANEL
ALT: 29 IU/L (ref 0–32)
AST: 21 IU/L (ref 0–40)
Albumin/Globulin Ratio: 1.4 (ref 1.2–2.2)
Albumin: 4.3 g/dL (ref 3.5–5.5)
Alkaline Phosphatase: 100 IU/L (ref 39–117)
BILIRUBIN TOTAL: 0.3 mg/dL (ref 0.0–1.2)
BUN/Creatinine Ratio: 23 (ref 9–23)
BUN: 16 mg/dL (ref 6–24)
CHLORIDE: 106 mmol/L (ref 96–106)
CO2: 20 mmol/L (ref 20–29)
Calcium: 9.5 mg/dL (ref 8.7–10.2)
Creatinine, Ser: 0.69 mg/dL (ref 0.57–1.00)
GFR calc Af Amer: 113 mL/min/{1.73_m2} (ref >59)
GFR calc non Af Amer: 98 mL/min/{1.73_m2} (ref >59)
Globulin, Total: 3 g/dL (ref 1.5–4.5)
Glucose: 147 mg/dL — ABNORMAL HIGH (ref 65–99)
Potassium: 4.3 mmol/L (ref 3.5–5.2)
Sodium: 142 mmol/L (ref 134–144)
Total Protein: 7.3 g/dL (ref 6.0–8.5)

## 2018-04-06 ENCOUNTER — Encounter: Payer: Self-pay | Admitting: Family Medicine

## 2018-04-08 ENCOUNTER — Encounter: Payer: Self-pay | Admitting: Skilled Nursing Facility1

## 2018-04-08 ENCOUNTER — Encounter: Payer: 59 | Attending: General Surgery | Admitting: Skilled Nursing Facility1

## 2018-04-08 DIAGNOSIS — Z713 Dietary counseling and surveillance: Secondary | ICD-10-CM | POA: Diagnosis not present

## 2018-04-08 DIAGNOSIS — E669 Obesity, unspecified: Secondary | ICD-10-CM

## 2018-04-08 DIAGNOSIS — Z6841 Body Mass Index (BMI) 40.0 and over, adult: Secondary | ICD-10-CM | POA: Diagnosis not present

## 2018-04-08 NOTE — Progress Notes (Signed)
Pre-Op Assessment Visit:  Pre-Operative RYGB Surgery  Medical Nutrition Therapy:  Appt start time: 4:42  End time:  5:36  Patient was seen on 04/08/2018 for Pre-Operative Nutrition Assessment. Assessment and letter of approval faxed to [facility] Bariatric Surgery Program coordinator on 04/08/2018.   Pt states she does not care about the number she just wants to be healthy and not take as many medications. Pt states she checks her sugars fasting: 125-180+. Pt states she is lazy and does not want to put effort into her food.   Pt expectation of surgery: to lose weight and come off some medications   Pt expectation of Dietitian: just do your jobs    Start weight at [place]: 235.6 BMI: 46.01  24 hr Dietary Recall: First Meal: protein shake and greek yogurt or grits eggs and bacon and sausage  Snack: fruit or cheese and nuts or yogurt or snap peas Second Meal: fast food or frozen meal or ham and cheese sandwich Snack:  Third Meal: cereal or out to eat or sandwich Snack: m and m's Beverages: orange juice, hot chocolate, water, diet pepsi, chocolate milk  Encouraged to engage in 150 minutes of moderate physical activity including cardiovascular and weight baring weekly  Handouts given during visit include:  . Pre-Op Goals . Bariatric Surgery Protein Shakes During the appointment today the following Pre-Op Goals were reviewed with the patient: . Maintain or lose weight as instructed by your surgeon . Make healthy food choices . Begin to limit portion sizes . Limited concentrated sugars and fried foods . Keep fat/sugar in the single digits per serving on             food labels . Practice CHEWING your food  (aim for 30 chews per bite or until applesauce consistency) . Practice not drinking 15 minutes before, during, and 30 minutes after each meal/snack . Avoid all carbonated beverages  . Avoid/limit caffeinated beverages  . Avoid all sugar-sweetened beverages . Consume 3  meals per day; eat every 3-5 hours . Make a list of non-food related activities . Aim for 64-100 ounces of FLUID daily  . Aim for at least 60-80 grams of PROTEIN daily . Look for a liquid protein source that contain ?15 g protein and ?5 g carbohydrate  (ex: shakes, drinks, shots)  -Follow diet recommendations listed below   Energy and Macronutrient Recomendations: Calories: 1400 Carbohydrate:158 Protein: 105 Fat: 39  Demonstrated degree of understanding via:  Teach Back  Teaching Method Utilized:  Visual Auditory Hands on  Barriers to learning/adherence to lifestyle change: contemplative stage of change  Patient to call the [facility] to enroll in Pre-Op and Post-Op Nutrition Education when surgery date is scheduled.

## 2018-04-21 ENCOUNTER — Ambulatory Visit (INDEPENDENT_AMBULATORY_CARE_PROVIDER_SITE_OTHER): Payer: 59 | Admitting: Psychiatry

## 2018-04-21 DIAGNOSIS — F509 Eating disorder, unspecified: Secondary | ICD-10-CM | POA: Diagnosis not present

## 2018-04-24 MED FILL — VICTOZA 18 MG/3 ML INJECT P: 18 | 90 days supply | Qty: 27 | Fill #1

## 2018-04-24 MED FILL — ATORVASTATIN 20 MG TABLET: 20 | 90 days supply | Qty: 90 | Fill #0

## 2018-04-24 MED FILL — JARDIANCE 25 MG TABLET: 25 | 90 days supply | Qty: 90 | Fill #1

## 2018-04-27 ENCOUNTER — Ambulatory Visit: Payer: Self-pay

## 2018-04-27 MED FILL — BUPROPION HCL SR 200 MG TAB: 200 | 90 days supply | Qty: 90 | Fill #0

## 2018-04-27 MED FILL — ACYCLOVIR 400 MG TABLET: 400 | 90 days supply | Qty: 180 | Fill #3

## 2018-04-30 ENCOUNTER — Ambulatory Visit (INDEPENDENT_AMBULATORY_CARE_PROVIDER_SITE_OTHER): Payer: 59 | Admitting: Specialist

## 2018-05-02 ENCOUNTER — Telehealth: Payer: Self-pay | Admitting: Family Medicine

## 2018-05-02 NOTE — Telephone Encounter (Signed)
Please fax over letter created by myself to [facility] at [phone].  Attention: bariatric office coordinator.

## 2018-05-02 NOTE — Telephone Encounter (Signed)
Patient faxed over bariatric surgery letter of medical necessity for [facility].

## 2018-05-05 ENCOUNTER — Ambulatory Visit: Payer: 59 | Admitting: Psychiatry

## 2018-05-05 NOTE — Telephone Encounter (Signed)
Letter faxed sucessfully

## 2018-05-12 ENCOUNTER — Ambulatory Visit (INDEPENDENT_AMBULATORY_CARE_PROVIDER_SITE_OTHER): Payer: 59 | Admitting: Psychiatry

## 2018-05-12 ENCOUNTER — Other Ambulatory Visit (INDEPENDENT_AMBULATORY_CARE_PROVIDER_SITE_OTHER): Payer: Self-pay | Admitting: Specialist

## 2018-05-12 DIAGNOSIS — F509 Eating disorder, unspecified: Secondary | ICD-10-CM

## 2018-05-12 NOTE — Telephone Encounter (Signed)
Patient requests refill on naproxen. CB # [phone]

## 2018-05-13 MED ORDER — NAPROXEN 500 MG PO TABS: 500.0000 mg | ORAL_TABLET | Freq: Two times a day (BID) | ORAL | 3 refills | Status: DC

## 2018-05-13 MED FILL — NAPROXEN 500 MG TABLET: 500 | 30 days supply | Qty: 60 | Fill #0

## 2018-05-18 ENCOUNTER — Encounter: Payer: 59 | Attending: General Surgery | Admitting: Skilled Nursing Facility1

## 2018-05-18 DIAGNOSIS — Z713 Dietary counseling and surveillance: Secondary | ICD-10-CM | POA: Insufficient documentation

## 2018-05-18 DIAGNOSIS — Z6841 Body Mass Index (BMI) 40.0 and over, adult: Secondary | ICD-10-CM | POA: Diagnosis not present

## 2018-05-18 DIAGNOSIS — E1142 Type 2 diabetes mellitus with diabetic polyneuropathy: Secondary | ICD-10-CM

## 2018-05-19 ENCOUNTER — Encounter: Payer: Self-pay | Admitting: Skilled Nursing Facility1

## 2018-05-19 NOTE — Progress Notes (Signed)
Pre-Operative Nutrition Class:  Appt start time: 8889   End time:  1830.  Patient was seen on 05/19/2018 for Pre-Operative Bariatric Surgery Education at [facility].   Surgery type:RYGB Surgery date:  Start weight at [facility]: 235.6 Wt: 236.9 BMI: 46.27  Samples given per MNT protocol. Patient educated on appropriate usage: Bariatric Advantage Multivitamin Lot # [id number] Exp: 10/20  Bariatric Advantage Calcium  Lot # [id number] Exp:07/04/2019  [place] Protein Shake Lot # [id number] Exp: oct/16/19  The following the learning objectives were met by the patient during this course:  Identify Pre-Op Dietary Goals and will begin 2 weeks pre-operatively  Identify appropriate sources of fluids and proteins   State protein recommendations and appropriate sources pre and post-operatively  Identify Post-Operative Dietary Goals and will follow for 2 weeks post-operatively  Identify appropriate multivitamin and calcium sources  Describe the need for physical activity post-operatively and will follow MD recommendations  State when to call healthcare provider regarding medication questions or post-operative complications  Handouts given during class include:  Pre-Op Bariatric Surgery Diet Handout  Protein Shake Handout  Post-Op Bariatric Surgery Nutrition Handout  BELT Program Information Flyer  Support Group Information Flyer  [facility] Bariatric Supplements Price List  [facility]: [facility] per MD.

## 2018-05-26 ENCOUNTER — Ambulatory Visit (INDEPENDENT_AMBULATORY_CARE_PROVIDER_SITE_OTHER): Payer: 59 | Admitting: Specialist

## 2018-05-26 ENCOUNTER — Encounter (INDEPENDENT_AMBULATORY_CARE_PROVIDER_SITE_OTHER): Payer: Self-pay | Admitting: Specialist

## 2018-05-26 VITALS — BP 124/75 | HR 90 | Ht 60.0 in | Wt 236.0 lb

## 2018-05-26 DIAGNOSIS — M170 Bilateral primary osteoarthritis of knee: Secondary | ICD-10-CM

## 2018-05-26 DIAGNOSIS — M1712 Unilateral primary osteoarthritis, left knee: Secondary | ICD-10-CM | POA: Diagnosis not present

## 2018-05-26 NOTE — Progress Notes (Signed)
Office Visit Note   Patient: Cristina [name]           Date of Birth: [birth year]           MRN: [id number] Visit Date: 05/26/2018              Requested by: [name], MD [address] [place],  [address] PCP: [name], MD   Assessment & Plan: Visit Diagnoses:  1. Unilateral primary osteoarthritis, left knee   2. Bilateral primary osteoarthritis of knee     Plan: Since patient has failed conservative treatment with left knee intra-articular Marcaine/Depo-Medrol injections, oral NSAIDs, ice, activity modification she understands that best treatment option at this point would be left total knee replacement.  States that she would like to look at having this done around December.  We did discuss trying further conservative treatment with Visco supplementation but I advised that with the significant degenerative changes that she has that I do not think that this would be of any benefit to her.  She voices understanding.  We will need preop medical and cardiac clearances and patient was given forms to take to Dr. Tamala Julian and Dr. Acie Fredrickson.  Patient is hoping to have gastric bypass by Dr. Excell Seltzer at the end of August.  If she proceeds with that surgery we will need a clearance from him as well for her knee but I also advised patient that if she decides not to proceed with gastric bypass to let us know and we can possibly get her in sooner than December.  I advised patient to bring her form for jury duty and I will give her a note excusing her from this.  Follow-Up Instructions: Return in about 3 months (around 08/26/2018) for to discuss total knee replacement with Dr Louanne Skye.   Orders:  No orders of the defined types were placed in this encounter.  No orders of the defined types were placed in this encounter.     Procedures: No procedures performed   Clinical Data: No additional findings.   Subjective: Chief Complaint  Patient presents with  . Left Knee - Pain     HPI 57 year old female history of end-stage DJD left knee returns for follow-up.  Dr. Louanne Skye performed intra-articular Marcaine/Depo-Medrol injection a couple months ago she states that this gave temporary improvement.  She continues have ongoing pain in her knee with all activity.  She also has problems with the right knee where she has end-stage degenerative changes there as well.  She is ready to proceed with scheduling left total knee replacement and looking to have this done around December 2019.  Patient is also seen about scheduling gastric bypass surgery with Dr. Excell Seltzer around the end of August.  Patient has for a note using her from jury duty.  Along with her bilateral knee issues she also has problems with her lumbar spine.  Previous lumbar MRI from February 29, 2016 showed: IMPRESSION: 1. 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 2. Mild right subarticular and foraminal stenosis at L4-5. 3. Chronic endplate changes anteriorly at L1-2 with a mild broad-based disc protrusion which partially effaces the ventral CSF but does not create any significant focal stenosis. 4. Multilevel facet hypertrophy throughout the lumbar spine, most prominent at L4-5.  Patient is problems ambulating for long distances and also sitting for prolonged periods due to lumbar  spine and bilateral knee issues.  Review of Systems No current cardiac pulmonary GI GU issues  Objective: Vital Signs: BP 124/75 (BP Location: Left Arm, Patient Position: Sitting)   Pulse 90   Ht 5' (1.524 m)   Wt 236 lb (107 kg)   BMI 46.09 kg/m   Physical Exam  Constitutional: She is oriented to person, place, and time. No distress.  HENT:  Head: Normocephalic and atraumatic.  Eyes: Pupils are equal, round, and reactive to light. EOM are normal.  Pulmonary/Chest: No  respiratory distress.  Musculoskeletal:  Gait is antalgic.  Negative logroll bilateral hips.  Bilateral knees positive patellofemoral crepitus and patellar grind.  Left greater than right knee joint line tenderness.  Left knee positive effusion.  Ligament stable.  Bilateral calves nontender.  Neurovascular intact.  Neurological: She is alert and oriented to person, place, and time.  Skin: Skin is warm and dry.    Ortho Exam  Specialty Comments:  No specialty comments available.  Imaging: No results found.   PMFS History: Patient Active Problem List   Diagnosis Date Noted  . Type 2 diabetes mellitus without complication, without long-term current use of insulin ([place]) 05/22/2017  . Class 3 obesity with serious comorbidity and body mass index (BMI) of 40.0 to 44.9 in adult 05/21/2017  . Family history of colon cancer 04/01/2017  . Irritable bowel syndrome with diarrhea 04/01/2017  . NSAID long-term use 04/01/2017  . Lymphocytosis 02/24/2017  . Essential hypertension 02/12/2017  . Mild intermittent asthma with acute exacerbation 12/12/2016  . Left shoulder pain 06/02/2015  . Insomnia 05/12/2014  . Fibromyalgia 05/12/2014  . Allergic rhinitis 03/28/2014  . Osteoarthritis 12/20/2013  . Cervical strain 06/21/2013  . Low back pain 06/21/2013  . Metatarsalgia of both feet 03/11/2013  . Sciatica 01/25/2013  . Restless leg syndrome 11/24/2012  . Pure hypercholesterolemia 10/20/2012  . OSA on CPAP 10/20/2012  . Depression 10/20/2012  . Iron deficiency anemia, unspecified 10/01/2012  . Diabetes mellitus, type 2 ([place]) 12/31/2010  . ANAL FISSURE 12/31/2010  . Morbid obesity ([place]) 01/08/2007  . CONSTIPATION 01/08/2007   Past Medical History:  Diagnosis Date  . Allergic rhinitis, cause unspecified   . Anemia   . Arthritis   . Asthma   . Back pain   . Chest pain   . Constipation   . CTS (carpal tunnel syndrome)   . Cystocele   . Depression   . Diabetes mellitus   .  Dysfunction of eustachian tube   . Fatty liver   . Fissure, anal   . Gallbladder problem   . Genital herpes   . GERD (gastroesophageal reflux disease)   . Hemorrhoid   . Hx of migraine headaches   . Hyperlipidemia   . Hypertension   . IBS (irritable bowel syndrome)   . Insomnia   . Iron deficiency anemia, unspecified   . Joint pain   . Nausea   . Obesity   . OSA on CPAP   . Osteoarthritis   . Rectocele   . Sleep apnea   . TMJ (dislocation of temporomandibular joint)     Family History  Problem Relation Age of Onset  . Hypertension Mother 25  . Osteoarthritis Mother   . Diabetes Mother   . Colon cancer Mother 17  . Cancer Mother 46       colon cancer  . Hyperlipidemia Mother   . Obesity Mother   . Sudden death Mother   . Arthritis Mother   .  Colon cancer Paternal Grandmother   . Cancer Paternal Grandmother        colon cancer  . Stroke Maternal Grandmother   . Colon cancer Maternal Grandmother   . Colon polyps Brother        x 2 brother  . Diabetes Daughter   . Hypertension Daughter   . Sleep apnea Daughter   . Mental illness Daughter        depression  . Migraines Daughter   . Cirrhosis Brother        alcoholism  . Alcohol abuse Brother   . Migraines Daughter   . Mental illness Daughter        anxiety attacks  . Protein S deficiency Daughter   . Sleep apnea Brother   . Hyperlipidemia Brother   . Migraines Brother   . Obstructive Sleep Apnea Brother   . Colon cancer Paternal Aunt     Past Surgical History:  Procedure Laterality Date  . ABDOMINAL HYSTERECTOMY  11/11/2010   [place] Plan.  Ovaries intact.  Uterine fibroids with DUB.  [name] CARPAL TUNNEL RELEASE  1989   Left  . CHOLECYSTECTOMY    . CHOLECYSTECTOMY    . COLONOSCOPY  06/29/2012   normal.  Repeat in 5 years.  [name] [place]  . ESOPHAGOGASTRODUODENOSCOPY  12/13/2011   dysphagia.  Henrene Pastor.  Normal.  . Sleep study  09/11/2012   severe OSA.  CPAP titration at 12 cm water pressure.   . TUBAL LIGATION  1988   Social History   Occupational History  . Occupation: Audiological scientist: [employer]    Comment: [employer]  Tobacco Use  . Smoking status: Former Research scientist (life sciences)  . Smokeless tobacco: Never Used  . Tobacco comment: smoked occasionally longest 6 mos.  Substance and Sexual Activity  . Alcohol use: No    Alcohol/week: 0.6 oz    Types: 1 Glasses of wine per week  . Drug use: No  . Sexual activity: Never    Birth control/protection: None

## 2018-06-11 ENCOUNTER — Ambulatory Visit (INDEPENDENT_AMBULATORY_CARE_PROVIDER_SITE_OTHER): Payer: 59 | Admitting: Specialist

## 2018-06-12 ENCOUNTER — Other Ambulatory Visit (INDEPENDENT_AMBULATORY_CARE_PROVIDER_SITE_OTHER): Payer: 59

## 2018-06-12 ENCOUNTER — Ambulatory Visit (INDEPENDENT_AMBULATORY_CARE_PROVIDER_SITE_OTHER): Payer: 59

## 2018-06-12 DIAGNOSIS — Z01812 Encounter for preprocedural laboratory examination: Secondary | ICD-10-CM | POA: Diagnosis not present

## 2018-06-12 NOTE — Progress Notes (Signed)
EKG done for pre op. Dr Angelena Form reviewed/signed.

## 2018-06-16 ENCOUNTER — Other Ambulatory Visit: Payer: Self-pay | Admitting: *Deleted

## 2018-06-16 NOTE — Patient Outreach (Signed)
[facility]) Care Management  06/16/2018  ANTINETTE KEOUGH 02/05/1961 [id number]   Subjective: Telephone call to patient's home  / mobile number, no answer, left HIPAA compliant voicemail message, and requested call back.    Objective:Per KPN (Knowledge Performance Now, point of care tool) and chart review, patient to be admitted on 06/12/18 for morbid obesity, and discharge date unknown.  Patient also has a history of diabetes, hypertension, OSA on CPAP, Iron deficiency anemia, IBS, Fibromyalgia, Osteoarthritis, Metatarsalgia of both feet, Pure hypercholesterolemia,Rectocele,  and TMJ (dislocation of temporomandibular joint).  Patient transition from [place] [facility] program to [facility] program in  January of 2018.     Assessment: Received UMR Transition of care referral on 06/16/18.  Transition of care follow up pending patient contact.      Plan: RNCM will send unsuccessful outreach  letter, [facility] pamphlet, will call patient for 2nd telephone outreach attempt, transition of care follow up, and proceed with case closure, within 10 business days if no return call.    Nahiara Kretzschmar H. Annia Friendly, BSN, [place] Management [facility] Telephonic CM Phone: [phone] Fax: [phone]

## 2018-06-18 ENCOUNTER — Other Ambulatory Visit: Payer: Self-pay | Admitting: *Deleted

## 2018-06-18 NOTE — Patient Outreach (Addendum)
[facility] [facility]) Care Management  06/18/2018  Cristina [name] 06/26/1961 [id number]   Subjective: Telephone call to patient's home / mobile number, spoke with patient, and HIPAA verified.  Discussed [facility] Care Management UMR Transition of care follow up, preoperative call follow up, patient voiced understanding, and is in agreement to both types of follow up.  Patient states gastric bypass surgery has not been scheduled and the surgeon's office is waiting for insurance approval to schedule the surgery.  States she was active with [facility] diabetes management program in the past and was dropped from the program due to not entering her information in the system.  She requesting to be re-referred to [facility] for diabetes management.  RNCM advised will refer patient [facility] per patient's request.  Patient states she is able to manage self care and has assistance as needed.  Patient voices understanding of medical diagnosis, pending surgery, and treatment plan. States she is accessing the following [facility] benefits: outpatient pharmacy, hospital indemnity (not chosen benefit), and will call [employer]  to start family medical leave act (FMLA) process once surgery date confirmed.  Patient states she does not have any preoperative questions, care coordination, disease management, disease monitoring, transportation, community resource, or pharmacy needs at this time.  States she is very appreciative of the follow up and is in agreement to receive [place] Management information post transition of care follow up.    Patient request call back when surgery scheduled and may have additional questions.     Objective:Per KPN (Knowledge Performance Now, point of care tool) and chart review, patient to be admitted on 06/12/18 for morbid obesity, and discharge date unknown.  Patient also has a history of diabetes, hypertension, OSA on CPAP, Iron deficiency anemia, IBS, Fibromyalgia,  Osteoarthritis, Metatarsalgia of both feet, Pure hypercholesterolemia,Rectocele,  and TMJ (dislocation of temporomandibular joint).  Patient transition from [place] to [facility] diabetes management program to [facility] program in  January of 2018.     Assessment: Received UMR Transition of care referral on 06/16/18.  Brief preoperative follow up completed, additional preoperative follow up pending surgery date confirmation, and transition of care follow up pending notification of patient discharge.     Plan: RNCM  Has sent unsuccessful outreach  letter, [facility] pamphlet, will call patient for  telephone outreach attempt, transition of care follow up, within 3 business days of hospital discharge notification. RNCM send request to [place] [facility] [place] [facility] to refer patient to [place] Management for diabetes disease management.       Ananiah Maciolek H. Annia Friendly, BSN, [place] Management [facility] Telephonic CM Phone: [phone] Fax: [phone]

## 2018-06-23 MED FILL — GABAPENTIN 300 MG CAPSULE: 300 | 90 days supply | Qty: 270 | Fill #0

## 2018-07-02 NOTE — Progress Notes (Signed)
06-12-18 (Epic) EKG,   11-13-16 (Epic) STRESS  11-08-16 (Epic) ECHO

## 2018-07-02 NOTE — Patient Instructions (Addendum)
Cristina [name]  07/02/2018   Your procedure is scheduled on: 07-06-18   Report to [facility] Main  Entrance    Report to Admitting at 12:00 AM    Call this number if you have problems the morning of surgery [phone]   Remember: Do not eat food or drink liquids :After Midnight. You may have a Clear Liquid Diet from Midnight until 8:00 AM. After 8:00 AM, nothing until after surgery.     CLEAR LIQUID DIET   Foods Allowed                                                                     Foods Excluded  Coffee and tea, regular and decaf                             liquids that you cannot  Plain Jell-O in any flavor                                             see through such as: Fruit ices (not with fruit pulp)                                     milk, soups, orange juice  Iced Popsicles                                    All solid food Carbonated beverages, regular and diet                                    Cranberry, grape and apple juices Sports drinks like Gatorade Lightly seasoned clear broth or consume(fat free) Sugar, honey syrup  Sample Menu Breakfast                                Lunch                                     Supper Cranberry juice                    Beef broth                            Chicken broth Jell-O                                     Grape juice  Apple juice Coffee or tea                        Jell-O                                      Popsicle                                                Coffee or tea                        Coffee or tea  _____________________________________________________________________     Take these medicines the morning of surgery with A SIP OF WATER: Bupropion (Wellbutrin), Gabapentin (Neurontin), Pantoprazole (Protonix). You may also bring and use your inhaler, nasal spray, and eyedrops as needed.    DO NOT TAKE ANY DIABETIC MEDICATIONS DAY OF YOUR SURGERY                         You may not have any metal on your body including hair pins and              piercings  Do not wear jewelry, make-up, lotions, powders or perfumes, deodorant             Do not wear nail polish.  Do not shave  48 hours prior to surgery.                Do not bring valuables to the hospital. [place].  Contacts, dentures or bridgework may not be worn into surgery.  Leave suitcase in the car. After surgery it may be brought to your room.       Special Instructions: N/A              Please read over the following fact sheets you were given: _____________________________________________________________________  How to Manage Your Diabetes Before and After Surgery  Why is it important to control my blood sugar before and after surgery? . Improving blood sugar levels before and after surgery helps healing and can limit problems. . A way of improving blood sugar control is eating a healthy diet by: o  Eating less sugar and carbohydrates o  Increasing activity/exercise o  Talking with your doctor about reaching your blood sugar goals . High blood sugars (greater than 180 mg/dL) can raise your risk of infections and slow your recovery, so you will need to focus on controlling your diabetes during the weeks before surgery. . Make sure that the doctor who takes care of your diabetes knows about your planned surgery including the date and location.  How do I manage my blood sugar before surgery? . Check your blood sugar at least 4 times a day, starting 2 days before surgery, to make sure that the level is not too high or low. o Check your blood sugar the morning of your surgery when you wake up and every 2 hours until you get to the Short Stay unit. . If your blood sugar is less than 70 mg/dL, you will need to treat for low blood sugar: o  Do not take insulin. o Treat a low blood sugar (less than 70 mg/dL) with  cup of  clear juice (cranberry or apple), 4 glucose tablets, OR glucose gel. o Recheck blood sugar in 15 minutes after treatment (to make sure it is greater than 70 mg/dL). If your blood sugar is not greater than 70 mg/dL on recheck, call [phone] for further instructions. . Report your blood sugar to the short stay nurse when you get to Short Stay.  . If you are admitted to the hospital after surgery: o Your blood sugar will be checked by the staff and you will probably be given insulin after surgery (instead of oral diabetes medicines) to make sure you have good blood sugar levels. o The goal for blood sugar control after surgery is 80-180 mg/dL.   WHAT DO I DO ABOUT MY DIABETES MEDICATION?  [name] Do not take oral diabetes medicines (pills) the morning of surgery.  . THE DAY BEFORE SURGERY, take your usual dose Metformin, and Jardiance, Victoza of insulin.       . The day of surgery, do not take other diabetes injectables, including Byetta (exenatide), Bydureon (exenatide ER), Victoza (liraglutide), or Trulicity (dulaglutide).    Reviewed and Endorsed by [facility] Patient Education Committee, August 2015   [facility] - Preparing for Surgery Before surgery, you can play an important role.  Because skin is not sterile, your skin needs to be as free of germs as possible.  You can reduce the number of germs on your skin by washing with CHG (chlorahexidine gluconate) soap before surgery.  CHG is an antiseptic cleaner which kills germs and bonds with the skin to continue killing germs even after washing. Please DO NOT use if you have an allergy to CHG or antibacterial soaps.  If your skin becomes reddened/irritated stop using the CHG and inform your nurse when you arrive at Short Stay. Do not shave (including legs and underarms) for at least 48 hours prior to the first CHG shower.  You may shave your face/neck. Please follow these instructions carefully:  1.  Shower with CHG Soap the night before  surgery and the  morning of Surgery.  2.  If you choose to wash your hair, wash your hair first as usual with your  normal  shampoo.  3.  After you shampoo, rinse your hair and body thoroughly to remove the  shampoo.                           4.  Use CHG as you would any other liquid soap.  You can apply chg directly  to the skin and wash                       Gently with a scrungie or clean washcloth.  5.  Apply the CHG Soap to your body ONLY FROM THE NECK DOWN.   Do not use on face/ open                           Wound or open sores. Avoid contact with eyes, ears mouth and genitals (private parts).                       Wash face,  Genitals (private parts) with your normal soap.             6.  Wash  thoroughly, paying special attention to the area where your surgery  will be performed.  7.  Thoroughly rinse your body with warm water from the neck down.  8.  DO NOT shower/wash with your normal soap after using and rinsing off  the CHG Soap.                9.  Pat yourself dry with a clean towel.            10.  Wear clean pajamas.            11.  Place clean sheets on your bed the night of your first shower and do not  sleep with pets. Day of Surgery : Do not apply any lotions/deodorants the morning of surgery.  Please wear clean clothes to the hospital/surgery center.  FAILURE TO FOLLOW THESE INSTRUCTIONS MAY RESULT IN THE CANCELLATION OF YOUR SURGERY PATIENT SIGNATURE_________________________________  NURSE SIGNATURE__________________________________  ________________________________________________________________________

## 2018-07-03 ENCOUNTER — Encounter (HOSPITAL_COMMUNITY)
Admission: RE | Admit: 2018-07-03 | Discharge: 2018-07-03 | Disposition: A | Discharge status: Home / Self Care | Payer: 59 | Source: Ambulatory Visit | Attending: General Surgery | Admitting: General Surgery

## 2018-07-03 ENCOUNTER — Encounter (HOSPITAL_COMMUNITY): Payer: Self-pay

## 2018-07-03 ENCOUNTER — Other Ambulatory Visit: Payer: Self-pay

## 2018-07-03 DIAGNOSIS — Z6841 Body Mass Index (BMI) 40.0 and over, adult: Secondary | ICD-10-CM | POA: Diagnosis not present

## 2018-07-03 DIAGNOSIS — E7800 Pure hypercholesterolemia, unspecified: Secondary | ICD-10-CM | POA: Diagnosis not present

## 2018-07-03 DIAGNOSIS — K7581 Nonalcoholic steatohepatitis (NASH): Secondary | ICD-10-CM | POA: Diagnosis not present

## 2018-07-03 DIAGNOSIS — E119 Type 2 diabetes mellitus without complications: Secondary | ICD-10-CM | POA: Diagnosis not present

## 2018-07-03 DIAGNOSIS — G4733 Obstructive sleep apnea (adult) (pediatric): Secondary | ICD-10-CM | POA: Diagnosis not present

## 2018-07-03 DIAGNOSIS — K219 Gastro-esophageal reflux disease without esophagitis: Secondary | ICD-10-CM | POA: Diagnosis not present

## 2018-07-03 DIAGNOSIS — Z90710 Acquired absence of both cervix and uterus: Secondary | ICD-10-CM | POA: Diagnosis not present

## 2018-07-03 DIAGNOSIS — M26609 Unspecified temporomandibular joint disorder, unspecified side: Secondary | ICD-10-CM | POA: Diagnosis not present

## 2018-07-03 LAB — BASIC METABOLIC PANEL
Anion gap: 9 (ref 5–15)
BUN: 22 mg/dL — ABNORMAL HIGH (ref 6–20)
CALCIUM: 9.1 mg/dL (ref 8.9–10.3)
CO2: 25 mmol/L (ref 22–32)
CREATININE: 0.74 mg/dL (ref 0.44–1.00)
Chloride: 110 mmol/L (ref 98–111)
GFR calc Af Amer: >60 mL/min (ref >60)
Glucose, Bld: 141 mg/dL — ABNORMAL HIGH (ref 70–99)
Potassium: 4.5 mmol/L (ref 3.5–5.1)
SODIUM: 144 mmol/L (ref 135–145)

## 2018-07-03 LAB — SURGICAL PCR SCREEN
MRSA, PCR: NEGATIVE
Staphylococcus aureus: NEGATIVE

## 2018-07-03 LAB — CBC
HCT: 43.3 % (ref 36.0–46.0)
Hemoglobin: 13.4 g/dL (ref 12.0–15.0)
MCH: 27.2 pg (ref 26.0–34.0)
MCHC: 30.9 g/dL (ref 30.0–36.0)
MCV: 88 fL (ref 78.0–100.0)
PLATELETS: 312 10*3/uL (ref 150–400)
RBC: 4.92 MIL/uL (ref 3.87–5.11)
RDW: 15.7 % — AB (ref 11.5–15.5)
WBC: 9.3 10*3/uL (ref 4.0–10.5)

## 2018-07-03 LAB — GLUCOSE, CAPILLARY: Glucose-Capillary: 140 mg/dL — ABNORMAL HIGH (ref 70–99)

## 2018-07-03 LAB — HEMOGLOBIN A1C
HEMOGLOBIN A1C: 8.7 % — AB (ref 4.8–5.6)
MEAN PLASMA GLUCOSE: 202.99 mg/dL

## 2018-07-06 ENCOUNTER — Inpatient Hospital Stay (HOSPITAL_COMMUNITY): Payer: 59 | Admitting: Anesthesiology

## 2018-07-06 ENCOUNTER — Encounter (HOSPITAL_COMMUNITY): Payer: Self-pay | Admitting: *Deleted

## 2018-07-06 ENCOUNTER — Encounter (HOSPITAL_COMMUNITY)
Admission: RE | Disposition: A | Discharge status: Home / Self Care | Payer: Self-pay | Source: Ambulatory Visit | Attending: General Surgery

## 2018-07-06 ENCOUNTER — Other Ambulatory Visit: Payer: Self-pay

## 2018-07-06 ENCOUNTER — Ambulatory Visit: Payer: Self-pay | Admitting: General Surgery

## 2018-07-06 ENCOUNTER — Inpatient Hospital Stay (HOSPITAL_COMMUNITY)
Admission: RE | Admit: 2018-07-06 | Discharge: 2018-07-08 | DRG: 983 | Disposition: A | Discharge status: Home / Self Care | Payer: 59 | Source: Ambulatory Visit | Attending: General Surgery | Admitting: General Surgery

## 2018-07-06 DIAGNOSIS — Z79891 Long term (current) use of opiate analgesic: Secondary | ICD-10-CM

## 2018-07-06 DIAGNOSIS — M797 Fibromyalgia: Secondary | ICD-10-CM | POA: Diagnosis present

## 2018-07-06 DIAGNOSIS — M170 Bilateral primary osteoarthritis of knee: Secondary | ICD-10-CM | POA: Diagnosis present

## 2018-07-06 DIAGNOSIS — K7581 Nonalcoholic steatohepatitis (NASH): Secondary | ICD-10-CM | POA: Diagnosis present

## 2018-07-06 DIAGNOSIS — E7800 Pure hypercholesterolemia, unspecified: Secondary | ICD-10-CM | POA: Diagnosis present

## 2018-07-06 DIAGNOSIS — M26609 Unspecified temporomandibular joint disorder, unspecified side: Secondary | ICD-10-CM | POA: Diagnosis present

## 2018-07-06 DIAGNOSIS — Z9049 Acquired absence of other specified parts of digestive tract: Secondary | ICD-10-CM | POA: Diagnosis not present

## 2018-07-06 DIAGNOSIS — K219 Gastro-esophageal reflux disease without esophagitis: Secondary | ICD-10-CM | POA: Diagnosis present

## 2018-07-06 DIAGNOSIS — E119 Type 2 diabetes mellitus without complications: Secondary | ICD-10-CM | POA: Diagnosis not present

## 2018-07-06 DIAGNOSIS — I10 Essential (primary) hypertension: Secondary | ICD-10-CM | POA: Diagnosis not present

## 2018-07-06 DIAGNOSIS — Z6841 Body Mass Index (BMI) 40.0 and over, adult: Secondary | ICD-10-CM

## 2018-07-06 DIAGNOSIS — Z90710 Acquired absence of both cervix and uterus: Secondary | ICD-10-CM | POA: Diagnosis not present

## 2018-07-06 DIAGNOSIS — Z791 Long term (current) use of non-steroidal anti-inflammatories (NSAID): Secondary | ICD-10-CM

## 2018-07-06 DIAGNOSIS — Z7951 Long term (current) use of inhaled steroids: Secondary | ICD-10-CM

## 2018-07-06 DIAGNOSIS — G4733 Obstructive sleep apnea (adult) (pediatric): Secondary | ICD-10-CM | POA: Diagnosis present

## 2018-07-06 DIAGNOSIS — Z7984 Long term (current) use of oral hypoglycemic drugs: Secondary | ICD-10-CM | POA: Diagnosis not present

## 2018-07-06 HISTORY — PX: GASTRIC ROUX-EN-Y: SHX5262

## 2018-07-06 LAB — CBC WITH DIFFERENTIAL/PLATELET
BASOS PCT: 0 %
Basophils Absolute: 0 10*3/uL (ref 0.0–0.1)
EOS ABS: 0.1 10*3/uL (ref 0.0–0.7)
Eosinophils Relative: 1 %
HEMATOCRIT: 45.9 % (ref 36.0–46.0)
Hemoglobin: 14.4 g/dL (ref 12.0–15.0)
Lymphocytes Relative: 43 %
Lymphs Abs: 4.5 10*3/uL — ABNORMAL HIGH (ref 0.7–4.0)
MCH: 27.5 pg (ref 26.0–34.0)
MCHC: 31.4 g/dL (ref 30.0–36.0)
MCV: 87.6 fL (ref 78.0–100.0)
MONOS PCT: 6 %
Monocytes Absolute: 0.6 10*3/uL (ref 0.1–1.0)
NEUTROS ABS: 5.1 10*3/uL (ref 1.7–7.7)
Neutrophils Relative %: 50 %
Platelets: 321 10*3/uL (ref 150–400)
RBC: 5.24 MIL/uL — ABNORMAL HIGH (ref 3.87–5.11)
RDW: 15.5 % (ref 11.5–15.5)
WBC: 10.4 10*3/uL (ref 4.0–10.5)

## 2018-07-06 LAB — GLUCOSE, CAPILLARY
GLUCOSE-CAPILLARY: 165 mg/dL — AB (ref 70–99)
GLUCOSE-CAPILLARY: 181 mg/dL — AB (ref 70–99)
GLUCOSE-CAPILLARY: 196 mg/dL — AB (ref 70–99)
Glucose-Capillary: 216 mg/dL — ABNORMAL HIGH (ref 70–99)

## 2018-07-06 LAB — COMPREHENSIVE METABOLIC PANEL
ALBUMIN: 4.3 g/dL (ref 3.5–5.0)
ALT: 49 U/L — ABNORMAL HIGH (ref 0–44)
ANION GAP: 14 (ref 5–15)
AST: 36 U/L (ref 15–41)
Alkaline Phosphatase: 86 U/L (ref 38–126)
BILIRUBIN TOTAL: 0.7 mg/dL (ref 0.3–1.2)
BUN: 17 mg/dL (ref 6–20)
CO2: 22 mmol/L (ref 22–32)
Calcium: 9.3 mg/dL (ref 8.9–10.3)
Chloride: 110 mmol/L (ref 98–111)
Creatinine, Ser: 0.93 mg/dL (ref 0.44–1.00)
GFR calc Af Amer: >60 mL/min (ref >60)
GLUCOSE: 173 mg/dL — AB (ref 70–99)
POTASSIUM: 3.9 mmol/L (ref 3.5–5.1)
Sodium: 146 mmol/L — ABNORMAL HIGH (ref 135–145)
TOTAL PROTEIN: 8.3 g/dL — AB (ref 6.5–8.1)

## 2018-07-06 LAB — HEMOGLOBIN AND HEMATOCRIT, BLOOD
HCT: 43.7 % (ref 36.0–46.0)
Hemoglobin: 13.9 g/dL (ref 12.0–15.0)

## 2018-07-06 LAB — TYPE AND SCREEN
ABO/RH(D): O POS
Antibody Screen: NEGATIVE

## 2018-07-06 LAB — ABO/RH: ABO/RH(D): O POS

## 2018-07-06 SURGERY — LAPAROSCOPIC ROUX-EN-Y GASTRIC BYPASS WITH UPPER ENDOSCOPY
Anesthesia: General | Site: Abdomen

## 2018-07-06 MED ORDER — SUGAMMADEX SODIUM 500 MG/5ML IV SOLN
INTRAVENOUS | Status: AC
  Filled 2018-07-06: qty 5

## 2018-07-06 MED ORDER — OXYCODONE HCL 5 MG/5ML PO SOLN: 5.0000 mg | ORAL | Status: DC | PRN

## 2018-07-06 MED ORDER — SODIUM CHLORIDE 0.9 % IJ SOLN
INTRAMUSCULAR | Status: AC
  Filled 2018-07-06: qty 50

## 2018-07-06 MED ORDER — SUGAMMADEX SODIUM 200 MG/2ML IV SOLN
INTRAVENOUS | Status: AC
  Filled 2018-07-06: qty 2

## 2018-07-06 MED ORDER — PROMETHAZINE HCL 25 MG/ML IJ SOLN: 6.2500 mg | INTRAMUSCULAR | Status: DC | PRN

## 2018-07-06 MED ORDER — ACETAMINOPHEN 160 MG/5ML PO SOLN
650.0000 mg | Freq: Four times a day (QID) | ORAL | Status: DC
  Administered 2018-07-06 – 2018-07-08 (×6): 650 mg via ORAL
  Filled 2018-07-06 (×6): qty 20.3

## 2018-07-06 MED ORDER — INSULIN GLARGINE 100 UNIT/ML ~~LOC~~ SOLN
10.0000 [IU] | Freq: Every day | SUBCUTANEOUS | Status: DC
  Administered 2018-07-06 – 2018-07-07 (×2): 10 [IU] via SUBCUTANEOUS
  Filled 2018-07-06 (×3): qty 0.1

## 2018-07-06 MED ORDER — ROCURONIUM BROMIDE 100 MG/10ML IV SOLN
INTRAVENOUS | Status: AC
  Filled 2018-07-06: qty 1

## 2018-07-06 MED ORDER — SCOPOLAMINE 1 MG/3DAYS TD PT72
1.0000 | MEDICATED_PATCH | TRANSDERMAL | Status: DC
  Administered 2018-07-06: 1.5 mg via TRANSDERMAL
  Filled 2018-07-06: qty 1

## 2018-07-06 MED ORDER — KETAMINE HCL 10 MG/ML IJ SOLN
INTRAMUSCULAR | Status: DC | PRN
  Administered 2018-07-06: 30 mg via INTRAVENOUS

## 2018-07-06 MED ORDER — LIDOCAINE 2% (20 MG/ML) 5 ML SYRINGE
INTRAMUSCULAR | Status: DC | PRN
  Administered 2018-07-06: 80 mg via INTRAVENOUS

## 2018-07-06 MED ORDER — PHENYLEPHRINE 40 MCG/ML (10ML) SYRINGE FOR IV PUSH (FOR BLOOD PRESSURE SUPPORT)
PREFILLED_SYRINGE | INTRAVENOUS | Status: AC
  Filled 2018-07-06: qty 10

## 2018-07-06 MED ORDER — MIDAZOLAM HCL 5 MG/5ML IJ SOLN
INTRAMUSCULAR | Status: DC | PRN
  Administered 2018-07-06: 2 mg via INTRAVENOUS

## 2018-07-06 MED ORDER — SODIUM CHLORIDE 0.9 % IV SOLN
2.0000 g | INTRAVENOUS | Status: AC
  Administered 2018-07-06: 2 g via INTRAVENOUS
  Filled 2018-07-06: qty 2

## 2018-07-06 MED ORDER — MORPHINE SULFATE (PF) 2 MG/ML IV SOLN
1.0000 mg | INTRAVENOUS | Status: DC | PRN
  Administered 2018-07-06: 2 mg via INTRAVENOUS
  Filled 2018-07-06: qty 1

## 2018-07-06 MED ORDER — LACTATED RINGERS IV SOLN
INTRAVENOUS | Status: DC
  Administered 2018-07-06 (×3): via INTRAVENOUS

## 2018-07-06 MED ORDER — FAMOTIDINE IN NACL 20-0.9 MG/50ML-% IV SOLN
20.0000 mg | Freq: Two times a day (BID) | INTRAVENOUS | Status: DC
  Administered 2018-07-06 – 2018-07-08 (×4): 20 mg via INTRAVENOUS
  Filled 2018-07-06 (×4): qty 50

## 2018-07-06 MED ORDER — INSULIN ASPART 100 UNIT/ML ~~LOC~~ SOLN
0.0000 [IU] | SUBCUTANEOUS | Status: DC
  Administered 2018-07-06: 4 [IU] via SUBCUTANEOUS
  Administered 2018-07-06: 7 [IU] via SUBCUTANEOUS
  Administered 2018-07-07: 4 [IU] via SUBCUTANEOUS
  Administered 2018-07-07 – 2018-07-08 (×5): 3 [IU] via SUBCUTANEOUS
  Administered 2018-07-08: 4 [IU] via SUBCUTANEOUS

## 2018-07-06 MED ORDER — DEXAMETHASONE SODIUM PHOSPHATE 4 MG/ML IJ SOLN: 4.0000 mg | INTRAMUSCULAR | Status: DC

## 2018-07-06 MED ORDER — LACTATED RINGERS IR SOLN
Status: DC | PRN
  Administered 2018-07-06: 1000 mL

## 2018-07-06 MED ORDER — STERILE WATER FOR IRRIGATION IR SOLN
Status: DC | PRN
  Administered 2018-07-06: 1000 mL

## 2018-07-06 MED ORDER — EVICEL 5 ML EX KIT
PACK | Freq: Once | CUTANEOUS | Status: AC
  Administered 2018-07-06: 5 mL
  Filled 2018-07-06: qty 1

## 2018-07-06 MED ORDER — SUGAMMADEX SODIUM 500 MG/5ML IV SOLN
INTRAVENOUS | Status: DC | PRN
  Administered 2018-07-06: 250 mg via INTRAVENOUS

## 2018-07-06 MED ORDER — CHLORHEXIDINE GLUCONATE CLOTH 2 % EX PADS: 6.0000 | MEDICATED_PAD | Freq: Once | CUTANEOUS | Status: DC

## 2018-07-06 MED ORDER — ALBUTEROL SULFATE HFA 108 (90 BASE) MCG/ACT IN AERS: 2.0000 | INHALATION_SPRAY | Freq: Four times a day (QID) | RESPIRATORY_TRACT | Status: DC | PRN

## 2018-07-06 MED ORDER — ALBUTEROL SULFATE (2.5 MG/3ML) 0.083% IN NEBU: 2.5000 mg | INHALATION_SOLUTION | Freq: Four times a day (QID) | RESPIRATORY_TRACT | Status: DC | PRN

## 2018-07-06 MED ORDER — LIDOCAINE HCL 2 % IJ SOLN
INTRAMUSCULAR | Status: AC
  Filled 2018-07-06: qty 20

## 2018-07-06 MED ORDER — NITROGLYCERIN 0.4 MG SL SUBL: 0.4000 mg | SUBLINGUAL_TABLET | SUBLINGUAL | Status: DC | PRN

## 2018-07-06 MED ORDER — BUPIVACAINE LIPOSOME 1.3 % IJ SUSP
INTRAMUSCULAR | Status: DC | PRN
  Administered 2018-07-06: 20 mL

## 2018-07-06 MED ORDER — FENTANYL CITRATE (PF) 100 MCG/2ML IJ SOLN: 25.0000 ug | INTRAMUSCULAR | Status: DC | PRN

## 2018-07-06 MED ORDER — FENTANYL CITRATE (PF) 100 MCG/2ML IJ SOLN
INTRAMUSCULAR | Status: AC
  Filled 2018-07-06: qty 2

## 2018-07-06 MED ORDER — FENTANYL CITRATE (PF) 100 MCG/2ML IJ SOLN
INTRAMUSCULAR | Status: DC | PRN
  Administered 2018-07-06 (×6): 50 ug via INTRAVENOUS

## 2018-07-06 MED ORDER — OXYCODONE HCL 5 MG/5ML PO SOLN
5.0000 mg | Freq: Once | ORAL | Status: DC | PRN
  Filled 2018-07-06: qty 5

## 2018-07-06 MED ORDER — APREPITANT 40 MG PO CAPS
40.0000 mg | ORAL_CAPSULE | ORAL | Status: AC
  Administered 2018-07-06: 40 mg via ORAL
  Filled 2018-07-06: qty 1

## 2018-07-06 MED ORDER — DEXAMETHASONE SODIUM PHOSPHATE 4 MG/ML IJ SOLN
INTRAMUSCULAR | Status: DC | PRN
  Administered 2018-07-06: 10 mg via INTRAVENOUS

## 2018-07-06 MED ORDER — CELECOXIB 200 MG PO CAPS
400.0000 mg | ORAL_CAPSULE | ORAL | Status: AC
  Administered 2018-07-06: 400 mg via ORAL
  Filled 2018-07-06: qty 2

## 2018-07-06 MED ORDER — FLUTICASONE PROPIONATE 50 MCG/ACT NA SUSP
2.0000 | Freq: Every day | NASAL | Status: DC
  Administered 2018-07-06 – 2018-07-07 (×2): 2 via NASAL
  Filled 2018-07-06: qty 16

## 2018-07-06 MED ORDER — ONDANSETRON HCL 4 MG/2ML IJ SOLN
INTRAMUSCULAR | Status: DC | PRN
  Administered 2018-07-06: 4 mg via INTRAVENOUS

## 2018-07-06 MED ORDER — ENOXAPARIN SODIUM 30 MG/0.3ML ~~LOC~~ SOLN
30.0000 mg | Freq: Two times a day (BID) | SUBCUTANEOUS | Status: DC
  Administered 2018-07-07 – 2018-07-08 (×3): 30 mg via SUBCUTANEOUS
  Filled 2018-07-06 (×3): qty 0.3

## 2018-07-06 MED ORDER — MIDAZOLAM HCL 2 MG/2ML IJ SOLN
INTRAMUSCULAR | Status: AC
  Filled 2018-07-06: qty 2

## 2018-07-06 MED ORDER — BUPIVACAINE LIPOSOME 1.3 % IJ SUSP
20.0000 mL | Freq: Once | INTRAMUSCULAR | Status: DC
  Filled 2018-07-06: qty 20

## 2018-07-06 MED ORDER — DEXAMETHASONE SODIUM PHOSPHATE 10 MG/ML IJ SOLN
INTRAMUSCULAR | Status: AC
  Filled 2018-07-06: qty 1

## 2018-07-06 MED ORDER — LIDOCAINE 2% (20 MG/ML) 5 ML SYRINGE
INTRAMUSCULAR | Status: DC | PRN
  Administered 2018-07-06: 1.5 mg/kg/h via INTRAVENOUS

## 2018-07-06 MED ORDER — ONDANSETRON HCL 4 MG/2ML IJ SOLN
INTRAMUSCULAR | Status: AC
  Filled 2018-07-06: qty 2

## 2018-07-06 MED ORDER — BUPIVACAINE-EPINEPHRINE 0.25% -1:200000 IJ SOLN
INTRAMUSCULAR | Status: DC | PRN
  Administered 2018-07-06: 30 mL

## 2018-07-06 MED ORDER — PROPOFOL 10 MG/ML IV BOLUS
INTRAVENOUS | Status: DC | PRN
  Administered 2018-07-06: 160 mg via INTRAVENOUS

## 2018-07-06 MED ORDER — POTASSIUM CHLORIDE IN NACL 20-0.9 MEQ/L-% IV SOLN
INTRAVENOUS | Status: DC
  Administered 2018-07-06 – 2018-07-08 (×4): via INTRAVENOUS
  Filled 2018-07-06 (×4): qty 1000

## 2018-07-06 MED ORDER — HEPARIN SODIUM (PORCINE) 5000 UNIT/ML IJ SOLN
5000.0000 [IU] | INTRAMUSCULAR | Status: AC
  Administered 2018-07-06: 5000 [IU] via SUBCUTANEOUS
  Filled 2018-07-06: qty 1

## 2018-07-06 MED ORDER — GABAPENTIN 300 MG PO CAPS
300.0000 mg | ORAL_CAPSULE | ORAL | Status: AC
  Administered 2018-07-06: 300 mg via ORAL
  Filled 2018-07-06: qty 1

## 2018-07-06 MED ORDER — BUPIVACAINE-EPINEPHRINE (PF) 0.25% -1:200000 IJ SOLN
INTRAMUSCULAR | Status: AC
  Filled 2018-07-06: qty 30

## 2018-07-06 MED ORDER — PHENYLEPHRINE 40 MCG/ML (10ML) SYRINGE FOR IV PUSH (FOR BLOOD PRESSURE SUPPORT)
PREFILLED_SYRINGE | INTRAVENOUS | Status: DC | PRN
  Administered 2018-07-06 (×2): 80 ug via INTRAVENOUS

## 2018-07-06 MED ORDER — PROPOFOL 10 MG/ML IV BOLUS
INTRAVENOUS | Status: AC
  Filled 2018-07-06: qty 20

## 2018-07-06 MED ORDER — PREMIER PROTEIN SHAKE
2.0000 [oz_av] | ORAL | Status: DC
  Administered 2018-07-07 – 2018-07-08 (×8): 2 [oz_av] via ORAL

## 2018-07-06 MED ORDER — ACETAMINOPHEN 500 MG PO TABS
1000.0000 mg | ORAL_TABLET | ORAL | Status: AC
  Administered 2018-07-06: 1000 mg via ORAL
  Filled 2018-07-06: qty 2

## 2018-07-06 MED ORDER — ONDANSETRON HCL 4 MG/2ML IJ SOLN: 4.0000 mg | INTRAMUSCULAR | Status: DC | PRN

## 2018-07-06 MED ORDER — TISSEEL VH 10 ML EX KIT
PACK | CUTANEOUS | Status: AC
  Filled 2018-07-06: qty 2

## 2018-07-06 MED ORDER — ROCURONIUM BROMIDE 50 MG/5ML IV SOSY
PREFILLED_SYRINGE | INTRAVENOUS | Status: DC | PRN
  Administered 2018-07-06: 60 mg via INTRAVENOUS
  Administered 2018-07-06 (×4): 20 mg via INTRAVENOUS

## 2018-07-06 MED ORDER — SODIUM CHLORIDE 0.9 % IJ SOLN
INTRAMUSCULAR | Status: DC | PRN
  Administered 2018-07-06: 50 mL

## 2018-07-06 MED ORDER — OXYCODONE HCL 5 MG PO TABS: 5.0000 mg | ORAL_TABLET | Freq: Once | ORAL | Status: DC | PRN

## 2018-07-06 SURGICAL SUPPLY — 90 items
ADH SKN CLS APL DERMABOND .7 (GAUZE/BANDAGES/DRESSINGS) ×1
APL SWBSTK 6 STRL LF DISP (MISCELLANEOUS) ×2
APPLICATOR COTTON TIP 6 STRL (MISCELLANEOUS) ×2 IMPLANT
APPLICATOR COTTON TIP 6IN STRL (MISCELLANEOUS) ×4
APPLIER CLIP ROT 10 11.4 M/L (STAPLE)
APPLIER CLIP ROT 13.4 12 LRG (CLIP)
APR CLP LRG 13.4X12 ROT 20 MLT (CLIP)
APR CLP MED LRG 11.4X10 (STAPLE)
BAG SPEC RTRVL LRG 6X4 10 (ENDOMECHANICALS)
BLADE SURG SZ11 CARB STEEL (BLADE) ×2 IMPLANT
CABLE HIGH FREQUENCY MONO STRZ (ELECTRODE) ×2 IMPLANT
CHLORAPREP W/TINT 26ML (MISCELLANEOUS) ×2 IMPLANT
CLIP APPLIE ROT 10 11.4 M/L (STAPLE) IMPLANT
CLIP APPLIE ROT 13.4 12 LRG (CLIP) IMPLANT
CLIP SUT LAPRA TY ABSORB (SUTURE) ×4 IMPLANT
CUTTER FLEX LINEAR 45M (STAPLE) ×2 IMPLANT
DECANTER SPIKE VIAL GLASS SM (MISCELLANEOUS) ×2 IMPLANT
DERMABOND ADVANCED (GAUZE/BANDAGES/DRESSINGS) ×1
DERMABOND ADVANCED .7 DNX12 (GAUZE/BANDAGES/DRESSINGS) ×1 IMPLANT
DEVICE SUT QUICK LOAD TK 5 (STAPLE) IMPLANT
DEVICE SUT TI-KNOT TK 5X26 (MISCELLANEOUS) IMPLANT
DEVICE SUTURE ENDOST 10MM (ENDOMECHANICALS) ×1 IMPLANT
DISSECTOR BLUNT TIP ENDO 5MM (MISCELLANEOUS) IMPLANT
DRAIN PENROSE 18X1/4 LTX STRL (WOUND CARE) ×2 IMPLANT
ELECT REM PT RETURN 15FT ADLT (MISCELLANEOUS) ×2 IMPLANT
GAUZE 4X4 16PLY RFD (DISPOSABLE) ×2 IMPLANT
GAUZE SPONGE 4X4 12PLY STRL (GAUZE/BANDAGES/DRESSINGS) ×2 IMPLANT
GLOVE BIOGEL PI IND STRL 7.0 (GLOVE) IMPLANT
GLOVE BIOGEL PI IND STRL 7.5 (GLOVE) ×1 IMPLANT
GLOVE BIOGEL PI INDICATOR 7.0 (GLOVE) ×1
GLOVE BIOGEL PI INDICATOR 7.5 (GLOVE) ×1
GLOVE ECLIPSE 7.5 STRL STRAW (GLOVE) ×2 IMPLANT
GLOVE SURG SIGNA 7.5 PF LTX (GLOVE) ×1 IMPLANT
GLOVE SURG SS PI 7.0 STRL IVOR (GLOVE) ×1 IMPLANT
GOWN STRL REUS W/ TWL LRG LVL3 (GOWN DISPOSABLE) IMPLANT
GOWN STRL REUS W/TWL LRG LVL3 (GOWN DISPOSABLE) ×2
GOWN STRL REUS W/TWL XL LVL3 (GOWN DISPOSABLE) ×5 IMPLANT
HEMOSTAT SURGICEL 4X8 (HEMOSTASIS) IMPLANT
HOVERMATT SINGLE USE (MISCELLANEOUS) ×2 IMPLANT
KIT BASIN OR (CUSTOM PROCEDURE TRAY) ×2 IMPLANT
KIT GASTRIC LAVAGE 34FR ADT (SET/KITS/TRAYS/PACK) ×2 IMPLANT
MARKER SKIN DUAL TIP RULER LAB (MISCELLANEOUS) ×2 IMPLANT
NDL SPNL 22GX3.5 QUINCKE BK (NEEDLE) ×1 IMPLANT
NEEDLE SPNL 22GX3.5 QUINCKE BK (NEEDLE) ×2 IMPLANT
PACK CARDIOVASCULAR III (CUSTOM PROCEDURE TRAY) ×2 IMPLANT
POUCH SPECIMEN RETRIEVAL 10MM (ENDOMECHANICALS) IMPLANT
RELOAD 45 VASCULAR/THIN (ENDOMECHANICALS) ×4 IMPLANT
RELOAD ENDO STITCH 2.0 (ENDOMECHANICALS) ×20
RELOAD STAPLE 45 2.5 WHT GRN (ENDOMECHANICALS) ×1 IMPLANT
RELOAD STAPLE 45 3.5 BLU ETS (ENDOMECHANICALS) ×1 IMPLANT
RELOAD STAPLE 60 2.6 WHT THN (STAPLE) ×1 IMPLANT
RELOAD STAPLE 60 3.6 BLU REG (STAPLE) ×2 IMPLANT
RELOAD STAPLE 60 3.8 GOLD REG (STAPLE) ×1 IMPLANT
RELOAD STAPLE TA45 3.5 REG BLU (ENDOMECHANICALS) ×2 IMPLANT
RELOAD STAPLER BLUE 60MM (STAPLE) ×4 IMPLANT
RELOAD STAPLER GOLD 60MM (STAPLE) ×1 IMPLANT
RELOAD STAPLER WHITE 60MM (STAPLE) ×1 IMPLANT
RELOAD SUT SNGL STCH ABSRB 2-0 (ENDOMECHANICALS) ×5 IMPLANT
RELOAD SUT SNGL STCH BLK 2-0 (ENDOMECHANICALS) ×5 IMPLANT
SCISSORS LAP 5X45 EPIX DISP (ENDOMECHANICALS) ×2 IMPLANT
SET IRRIG TUBING LAPAROSCOPIC (IRRIGATION / IRRIGATOR) ×2 IMPLANT
SHEARS HARMONIC ACE PLUS 45CM (MISCELLANEOUS) ×2 IMPLANT
SLEEVE ADV FIXATION 12X100MM (TROCAR) ×4 IMPLANT
SOLUTION ANTI FOG 6CC (MISCELLANEOUS) ×2 IMPLANT
STAPLER ECHELON LONG 60 440 (INSTRUMENTS) ×1 IMPLANT
STAPLER RELOAD BLUE 60MM (STAPLE) ×8
STAPLER RELOAD GOLD 60MM (STAPLE) ×2
STAPLER RELOAD WHITE 60MM (STAPLE) ×2
SUT MNCRL AB 4-0 PS2 18 (SUTURE) ×2 IMPLANT
SUT RELOAD ENDO STITCH 2 48X1 (ENDOMECHANICALS) ×5
SUT RELOAD ENDO STITCH 2.0 (ENDOMECHANICALS) ×5
SUT SURGIDAC NAB ES-9 0 48 120 (SUTURE) IMPLANT
SUT VIC AB 2-0 SH 27 (SUTURE)
SUT VIC AB 2-0 SH 27X BRD (SUTURE) IMPLANT
SUTURE RELOAD END STTCH 2 48X1 (ENDOMECHANICALS) ×5 IMPLANT
SUTURE RELOAD ENDO STITCH 2.0 (ENDOMECHANICALS) ×5 IMPLANT
SYR 10ML ECCENTRIC (SYRINGE) ×2 IMPLANT
SYR 20CC LL (SYRINGE) ×4 IMPLANT
SYR 50ML LL SCALE MARK (SYRINGE) ×2 IMPLANT
TIP RIGID 35CM EVICEL (HEMOSTASIS) ×2 IMPLANT
TOWEL OR 17X26 10 PK STRL BLUE (TOWEL DISPOSABLE) ×2 IMPLANT
TOWEL OR NON WOVEN STRL DISP B (DISPOSABLE) ×2 IMPLANT
TRAY FOLEY W/BAG SLVR 14FR (SET/KITS/TRAYS/PACK) ×1 IMPLANT
TROCAR ADV FIXATION 12X100MM (TROCAR) ×2 IMPLANT
TROCAR ADV FIXATION 5X100MM (TROCAR) ×2 IMPLANT
TROCAR BLADELESS OPT 5 100 (ENDOMECHANICALS) ×2 IMPLANT
TROCAR XCEL 12X100 BLDLESS (ENDOMECHANICALS) ×2 IMPLANT
TUBE CALIBRATION LAPBAND (TUBING) IMPLANT
TUBING CONNECTING 10 (TUBING) ×1 IMPLANT
TUBING INSUF HEATED (TUBING) ×1 IMPLANT

## 2018-07-06 NOTE — Anesthesia Preprocedure Evaluation (Addendum)
Anesthesia Evaluation  Patient identified by MRN, date of birth, ID band Patient awake    Reviewed: Allergy & Precautions, NPO status , Patient's Chart, lab work & pertinent test results  History of Anesthesia Complications Negative for: history of anesthetic complications  Airway Mallampati: I  TM Distance: >3 FB Neck ROM: Full    Dental  (+) Dental Advisory Given, Teeth Intact   Pulmonary asthma , sleep apnea and Continuous Positive Airway Pressure Ventilation , former smoker,    breath sounds clear to auscultation       Cardiovascular hypertension (no longer on meds), (-) angina Rhythm:Regular Rate:Normal   '18 Myoperfusion - Nuclear stress EF: 69%. There was no ST segment deviation noted during stress. The study is normal.   Neuro/Psych  Headaches, Depression    GI/Hepatic Neg liver ROS, GERD  Medicated and Controlled, IBS    Endo/Other  diabetes, Type 2, Oral Hypoglycemic Agents, Insulin DependentMorbid obesity  Renal/GU negative Renal ROS     Musculoskeletal  (+) Arthritis , Fibromyalgia -, narcotic dependent TMJ dysfunction Carpal Tunnel Syndrome    Abdominal (+) + obese,   Peds  Hematology negative hematology ROS (+)   Anesthesia Other Findings HSV  Reproductive/Obstetrics                            Anesthesia Physical Anesthesia Plan  ASA: III  Anesthesia Plan: General   Post-op Pain Management:    Induction: Intravenous  PONV Risk Score and Plan: 4 or greater and Treatment may vary due to age or medical condition, Ondansetron, Dexamethasone, Midazolam and Scopolamine patch - Pre-op  Airway Management Planned: Oral ETT  Additional Equipment: None  Intra-op Plan:   Post-operative Plan: Extubation in OR  Informed Consent: I have reviewed the patients History and Physical, chart, labs and discussed the procedure including the risks, benefits and alternatives for  the proposed anesthesia with the patient or authorized representative who has indicated his/her understanding and acceptance.   Dental advisory given  Plan Discussed with: CRNA and Anesthesiologist  Anesthesia Plan Comments:        Anesthesia Quick Evaluation

## 2018-07-06 NOTE — Transfer of Care (Signed)
Immediate Anesthesia Transfer of Care Note  Patient: Cristina [name]  Procedure(s) Performed: LAPAROSCOPIC ROUX-EN-Y GASTRIC BYPASS WITH UPPER ENDOSCOPY (N/A Abdomen)  Patient Location: PACU  Anesthesia Type:General  Level of Consciousness: drowsy and patient cooperative  Airway & Oxygen Therapy: Patient Spontanous Breathing and Patient connected to face mask oxygen  Post-op Assessment: Report given to RN and Post -op Vital signs reviewed and stable  Post vital signs: Reviewed and stable  Last Vitals:  Vitals Value Taken Time  BP 148/77 07/06/2018  6:02 PM  Temp    Pulse 95 07/06/2018  6:03 PM  Resp 16 07/06/2018  6:03 PM  SpO2 96 % 07/06/2018  6:03 PM  Vitals shown include unvalidated device data.  Last Pain:  Vitals:   07/06/18 1217  TempSrc: Oral      Patients Stated Pain Goal: 3 (99/23/41 4436)  Complications: No apparent anesthesia complications

## 2018-07-06 NOTE — Op Note (Signed)
Preop diagnosis: Morbid obesity  Postop diagnosis: Morbid obesity  Body mass index is 45.21 kg/m.  Surgical procedure: Laparoscopic Roux-en-Y gastric bypass  Surgeon: [name] T.Keziyah Kneale M.D.  Asst.: Alphonsa Overall M.D.  Anesthesia: General  Complications:  None  EBL: Minimal  Drains: None  Disposition: PACU in good condition  Description of procedure: Patient is brought to the operating room and general anesthesia induced. She had received preoperative broad-spectrum IV antibiotics and subcutaneous heparin. The abdomen was widely sterilely prepped and draped. Patient timeout was performed and correct patient and procedure confirmed. Access was obtained with a 12 mm Optiview trocar in the left upper quadrant and pneumoperitoneum established without difficulty. Under direct vision 12 mm trocars were placed laterally in the right upper quadrant, right upper quadrant midclavicular line, and to the left and above the umbilicus for the camera port. A 5 mm trocar was placed laterally in the left upper quadrant.  A bilateral T AP block was performed under direct vision with dilute Exparel. the omentum was brought into the upper abdomen and the transverse mesocolon elevated and the ligament of Treitz clearly identified. A 40 cm biliopancreatic limb was then carefully measured from the ligament of Treitz. The small intestine was divided at this point with a single firing of the white load linear stapler. A Penrose drain was sutured to the end of the Roux-en-Y limb for later identification. A 100 cm Roux-en-Y limb was then carefully measured. At this point a side-to-side anastomosis was created between the Roux limb and the end of the biliopancreatic limb. This was accomplished with a single firing of the 45 mm white load linear stapler. The common enterotomy was closed with a running 2-0 Vicryl begun at either end of the enterotomy and tied centrally. The mesenteric defect was then closed with running  2-0 silk. The omentum was then divided with the harmonic scalpel up towards the transverse colon to allow mobility of the Roux limb toward the gastric pouch. The patient was then placed in steep reversed Trendelenburg. Through a 5 mm subxiphoid site the [facility] retractor was placed and the left lobe of the liver elevated with excellent exposure of the upper stomach and hiatus. The angle of Hiss was then mobilized with the harmonic scalpel. A 4 cm gastric pouch was then carefully measured along the lesser curve of the stomach. Dissection was carried along the lesser curve at this point with the Harmonic scalpel working carefully back toward the lesser sac at right angles to the lesser curve. The free lesser sac was then entered. After being sure all tubes were removed from the stomach an initial firing of the gold load 60 mm linear stapler was fired at right angles across the lesser curve for about 4 cm. The gastric pouch was further mobilized posteriorly and then the pouch was completed with 2 further firings of the 60 mm blue load linear stapler up through the previously dissected angle of His. It was ensured that the pouch was completely mobilized away from the gastric remnant. This created a nice tubular 4-5 cm gastric pouch. The staple line of the gastric remnant was then oversewn with 2-0 silk for hemostasis. The Roux limb was then brought up in an antecolic fashion with the candycane facing to the patient's left without undue tension. The gastrojejunostomy was created with an initial posterior row of 2-0 Vicryl between the Roux limb and the staple line of the gastric pouch. Enterotomies were then made in the gastric pouch and the Roux limb with  the harmonic scalpel and at approximately 2-2-1/2 cm anastomosis was created with a single firing of the blue load linear stapler. The staple line was inspected and was intact without bleeding. The common enterotomy was then closed with running 2-0 Vicryl begun at  either end and tied centrally. The wall tube was then easily passed through the anastomosis and an outer anterior layer of running 2-0 Vicryl was placed. The Ewald tube was removed. With the outlet of the gastrojejunostomy clamped and under saline irrigation the assistant performed upper endoscopy and with the gastric pouch tensely distended with air there was no evidence of leak. The pouch was desufflated. The Terance Hart defect was closed with running 2-0 silk. The abdomen was inspected for any evidence of bleeding or bowel injury and everything looked fine. The Nathanson retractor was removed under direct vision after coating the anastomosis with Tisseel tissue sealant. All CO2 was evacuated and trochars removed. Skin incisions were closed with staples. Sponge needle and instrument counts were correct. The patient was taken to the PACU in good condition.     Edward Jolly MD, FACS  07/06/2018, 5:47 PM

## 2018-07-06 NOTE — Anesthesia Procedure Notes (Signed)
Procedure Name: Intubation Date/Time: 07/06/2018 2:45 PM Performed by: Deliah Boston, CRNA Pre-anesthesia Checklist: Patient identified, Emergency Drugs available, Suction available and Patient being monitored Patient Re-evaluated:Patient Re-evaluated prior to induction Oxygen Delivery Method: Circle system utilized Preoxygenation: Pre-oxygenation with 100% oxygen Induction Type: IV induction Ventilation: Mask ventilation without difficulty Laryngoscope Size: Mac and 3 Grade View: Grade I Tube type: Oral Tube size: 7.0 mm Number of attempts: 1 Airway Equipment and Method: Stylet and Oral airway Placement Confirmation: ETT inserted through vocal cords under direct vision,  positive ETCO2 and breath sounds checked- equal and bilateral Secured at: 22 cm Tube secured with: Tape Dental Injury: Teeth and Oropharynx as per pre-operative assessment

## 2018-07-06 NOTE — Progress Notes (Addendum)
PHARMACY CONSULT FOR:  Risk Assessment for Post-Discharge VTE Following Bariatric Surgery  Post-Discharge VTE Risk Assessment: This patient's probability of 30-day post-discharge VTE is increased due to:   Female    Age >/=60 years    BMI >/=50 kg/m2    CHF    Dyspnea at Rest    Paraplegia  X  Non-gastric-band surgery    Operation Time >/=3 hr    Return to OR     Length of Stay >/= 3 d   Predicted probability of 30-day post-discharge VTE:  0.16%  Other patient-specific factors to consider:  N/A   Recommendation for Discharge: No pharmacologic prophylaxis post-discharge      Cristina [name] is a 57 y.o. female who underwent  laparoscopic Roux-en-Y gastric bypass on 07/06/2018    Case start: 1507 Case end: 1745  Has order for enoxaparin 30 mg [place] q12h for inpatient VTE prophylaxis  Allergies  Allergen Reactions  . Fish Oil Rash    Patient Measurements: Height: 5' (152.4 cm) Weight: 231 lb 8 oz (105 kg) IBW/kg (Calculated) : 45.5 Body mass index is 45.21 kg/m.  Recent Labs    07/06/18 1225  WBC 10.4  HGB 14.4  HCT 45.9  PLT 321  CREATININE 0.93  ALBUMIN 4.3  PROT 8.3*  AST 36  ALT 49*  ALKPHOS 86  BILITOT 0.7   Estimated Creatinine Clearance: 74.8 mL/min (by C-G formula based on SCr of 0.93 mg/dL).    Past Medical History:  Diagnosis Date  . Allergic rhinitis, cause unspecified   . Anemia   . Arthritis   . Asthma   . Back pain   . Chest pain   . Constipation   . CTS (carpal tunnel syndrome)   . Cystocele   . Depression   . Diabetes mellitus   . Dysfunction of eustachian tube   . Fatty liver   . Fissure, anal   . Gallbladder problem   . Genital herpes   . GERD (gastroesophageal reflux disease)   . Hemorrhoid   . Hx of migraine headaches   . Hyperlipidemia   . Hypertension   . IBS (irritable bowel syndrome)   . Insomnia   . Iron deficiency anemia, unspecified   . Joint pain   . Nausea   . Obesity   . OSA on CPAP   . Osteoarthritis    . Rectocele   . Sleep apnea   . TMJ (dislocation of temporomandibular joint)      Medications Prior to Admission  Medication Sig Dispense Refill Last Dose  . acyclovir (ZOVIRAX) 400 MG tablet TAKE 1 TABLET BY MOUTH 2 TIMES DAILY. 180 tablet 3 07/05/2018 at Unknown time  . ANUCORT-HC 25 MG suppository PLACE 1 SUPPOSITORY RECTALLY 2 TIMES DAILY AS NEEDED FOR HEMORRHOIDS. 25 suppository 5 Taking  . atorvastatin (LIPITOR) 20 MG tablet Take 1 tablet (20 mg total) by mouth daily. (Patient taking differently: Take 20 mg by mouth at bedtime. ) 90 tablet 3 Taking  . azelastine (ASTELIN) 0.1 % nasal spray PLACE 2 SPRAYS INTO BOTH NOSTRILS 2 TIMES DAILY AS DIRECTED (Patient taking differently: Place 2 sprays into both nostrils 2 (two) times daily as needed for rhinitis or allergies. PLACE 2 SPRAYS INTO BOTH NOSTRILS 2 TIMES DAILY AS DIRECTE) 90 mL 3 Taking  . azelastine (OPTIVAR) 0.05 % ophthalmic solution Place 1 drop into both eyes daily as needed (allergies).   3 Taking  . BIOTIN 5000 PO Take 5,000 mcg by mouth daily.  Taking  . buPROPion (WELLBUTRIN SR) 200 MG 12 hr tablet Take 1 tablet (200 mg total) by mouth daily. 90 tablet 3 07/06/2018 at Unknown time  . calcium carbonate (TUMS - DOSED IN MG ELEMENTAL CALCIUM) 500 MG chewable tablet Chew 3-4 tablets by mouth daily as needed for indigestion or heartburn.     . Calcium Carbonate-Vitamin D (CALCIUM + D PO) Take 1 tablet by mouth daily.   Taking  . cetirizine (ZYRTEC) 10 MG tablet Take 10 mg by mouth at bedtime.    Taking  . diclofenac sodium (VOLTAREN) 1 % GEL Apply 4 g topically 4 (four) times daily. (Patient taking differently: Apply 4 g topically 4 (four) times daily as needed (pain). ) 5 Tube 3 Taking  . diltiazem 2 % GEL Apply 1 application topically 3 (three) times daily. (Patient taking differently: Apply 1 application topically 3 (three) times daily as needed (anal fissure). ) 1 Package 3 Taking  . empagliflozin (JARDIANCE) 25 MG TABS tablet  Take 25 mg by mouth daily. 90 tablet 3 07/05/2018 at Unknown time  . ferrous sulfate 325 (65 FE) MG EC tablet Take 325 mg by mouth daily.    07/05/2018 at Unknown time  . fluticasone (FLONASE) 50 MCG/ACT nasal spray PLACE 2 SPRAYS INTO BOTH NOSTRILS DAILY. 48 g 3 Taking  . gabapentin (NEURONTIN) 300 MG capsule Take 1 capsule (300 mg total) by mouth 3 (three) times daily. 270 capsule 1 07/06/2018 at 1030  . glipiZIDE (GLIPIZIDE XL) 10 MG 24 hr tablet TAKE 1 TABLET BY MOUTH DAILY WITH BREAKFAST. (Patient taking differently: Take 10 mg by mouth at bedtime. TAKE 1 TABLET BY MOUTH DAILY WITH BREAKFAST) 90 tablet 3 07/05/2018 at Unknown time  . Glucosamine HCl 1000 MG TABS Take 1,000 mg by mouth 2 (two) times daily with a meal.    Taking  . hydrocortisone 2.5 % ointment Apply topically 2 (two) times daily. (Patient taking differently: Apply 1 application topically 2 (two) times daily as needed (rash). ) 30 g 0 Taking  . liraglutide (VICTOZA) 18 MG/3ML SOPN Inject 0.3 mLs (1.8 mg total) into the skin daily. (Patient taking differently: Inject 1.8 mg into the skin every evening. ) 6 mL 11 07/05/2018 at Unknown time  . metFORMIN (GLUCOPHAGE) 1000 MG tablet TAKE 1 TABLET BY MOUTH 2 TIMES DAILY WITH A MEAL. 180 tablet 3 07/05/2018 at Unknown time  . montelukast (SINGULAIR) 10 MG tablet Take 1 tablet (10 mg total) by mouth at bedtime. 30 tablet 0 07/05/2018 at Unknown time  . Multiple Vitamin (MULTIVITAMIN) capsule Take 1 capsule by mouth daily.   07/05/2018 at Unknown time  . naproxen (NAPROSYN) 500 MG tablet Take 1 tablet (500 mg total) by mouth 2 (two) times daily with a meal. (Patient taking differently: Take 500 mg by mouth 2 (two) times daily as needed for mild pain. ) 60 tablet 3 Past Month at Unknown time  . nitroGLYCERIN (NITROSTAT) 0.4 MG SL tablet Place 1 tablet (0.4 mg total) under the tongue every 5 (five) minutes as needed for chest pain. 20 tablet 0 Taking  . pantoprazole (PROTONIX) 40 MG tablet TAKE 1  TABLET BY MOUTH 2 TIMES DAILY. 180 tablet 3 07/06/2018 at Unknown time  . rOPINIRole (REQUIP) 1 MG tablet Take 1 tablet (1 mg total) by mouth at bedtime. 90 tablet 3 07/05/2018 at Unknown time  . sertraline (ZOLOFT) 100 MG tablet Take 1.5 tablets (150 mg total) by mouth daily. (Patient taking differently: Take 150 mg by mouth  at bedtime. ) 135 tablet 3 07/05/2018 at Unknown time  . albuterol (PROAIR HFA) 108 (90 Base) MCG/ACT inhaler Inhale 2 puffs into the lungs every 6 (six) hours as needed. (Patient taking differently: Inhale 2 puffs into the lungs every 6 (six) hours as needed for wheezing or shortness of breath. ) 18 g 1 More than a month at Unknown time  . glucose blood test strip accucheck guide strips and fast clix lancets Use as instructed dm 2 100 each 1 Taking  . Insulin Pen Needle (PEN NEEDLES) 31G X 5 MM MISC 1 each by Does not apply route daily. USE TO INJECT VICTOZA DAILY AS DIRECTED 100 each 3 Taking  . traMADol (ULTRAM) 50 MG tablet Take 1 tablet (50 mg total) by mouth every 6 (six) hours as needed. (Patient not taking: Reported on 06/26/2018) 50 tablet 0 Completed Course at Unknown time  . TRUEPLUS LANCETS 30G MISC USE TO CHECK BLOOD GLUCOSE DAILY AS DIRECTED 100 each 3 Taking     07/06/2018,6:53 PM

## 2018-07-06 NOTE — Interval H&P Note (Signed)
History and Physical Interval Note:  07/06/2018 2:10 PM  Cristina [name]  has presented today for surgery, with the diagnosis of morbid obesity.  The various methods of treatment have been discussed with the patient and family. After consideration of risks, benefits and other options for treatment, the patient has consented to  Procedure(s): LAPAROSCOPIC ROUX-EN-Y GASTRIC BYPASS WITH UPPER ENDOSCOPY (N/A) as a surgical intervention .  The patient's history has been reviewed, patient examined, no change in status, stable for surgery.  I have reviewed the patient's chart and labs.  Questions were answered to the patient's satisfaction.     Darene Lamer Zeidy Tayag

## 2018-07-06 NOTE — Op Note (Signed)
Name:  ADLEY CASTELLO MRN: [id number] Date of Surgery: 07/06/2018  Preop Diagnosis:  Morbid Obesity, S/P RYGB  Postop Diagnosis:  Morbid Obesity, S/P RYGB (Weight - 238, BMI - 45)  Procedure:  Upper endoscopy  (Intraoperative)  Surgeon:  Alphonsa Overall, M.D.  Anesthesia:  GET  Indications for procedure: MEMORY HEINRICHS is a 57 y.o. female whose primary care physician is [name], MD and has completed a Roux-en-Y gastric bypass today by Dr. Excell Seltzer.  I am doing an intraoperative upper endoscopy to evaluate the gastric pouch and the gastro-jejunal anastomosis.  Operative Note: The patient is under general anesthesia.  Dr. Excell Seltzer is laparoscoping the patient while I do an upper endoscopy to evaluate the stomach pouch and gastrojejunal anastomosis.  With the patient intubated, I passed the Olympus endoscope without difficulty down the esophagus.  The esophago-gastric junction was at 39 cm.  The gastro-jejunal anastomosis was at 44 cm.  The mucosa of the stomach looked viable and the staple line was intact without bleeding.  The gastro-jejunal anastomosis looked okay.  While I insufflated the stomach pouch with air, Dr. Excell Seltzer clamped off the efferent limb of the jejunum.  He then flooded the upper abdomen with saline to put the gastric pouch and gastro-jejunal anastomosis under saline.  There was no bubbling or evidence of a leak.    The scope was then withdrawn.  The esophagus was unremarkable and the patient tolerated the endoscopy without difficulty.  Alphonsa Overall, MD, [facility] Surgery Pager: [phone] Office phone:  [phone]

## 2018-07-06 NOTE — Anesthesia Postprocedure Evaluation (Signed)
Anesthesia Post Note  Patient: Cristina [name]  Procedure(s) Performed: LAPAROSCOPIC ROUX-EN-Y GASTRIC BYPASS WITH UPPER ENDOSCOPY (N/A Abdomen)     Patient location during evaluation: PACU Anesthesia Type: General Level of consciousness: awake and alert Pain management: pain level controlled Vital Signs Assessment: post-procedure vital signs reviewed and stable Respiratory status: spontaneous breathing, nonlabored ventilation, respiratory function stable and patient connected to nasal cannula oxygen Cardiovascular status: blood pressure returned to baseline and stable Postop Assessment: no apparent nausea or vomiting Anesthetic complications: no    Last Vitals:  Vitals:   07/06/18 1830 07/06/18 1844  BP: (!) 143/87 (!) 159/97  Pulse: 94 92  Resp: 17 18  Temp: 36.9 C 37.3 C  SpO2: 92% 91%    Last Pain:  Vitals:   07/06/18 1844  TempSrc: Oral  PainSc:                  Audry Pili

## 2018-07-06 NOTE — Discharge Instructions (Signed)
° ° ° °GASTRIC BYPASS/SLEEVE ° Home Care Instructions ° ° These instructions are to help you care for yourself when you go home. ° °Call: If you have any problems. °• Call [phone] and ask for the surgeon on call °• If you need immediate help, come to the ER at [facility].  °• Tell the ER staff that you are a new post-op gastric bypass or gastric sleeve patient °  °Signs and symptoms to report: • Severe vomiting or nausea °o If you cannot keep down clear liquids for longer than 1 day, call your surgeon  °• Abdominal pain that does not get better after taking your pain medication °• Fever over 100.4° F with chills °• Heart beating over 100 beats a minute °• Shortness of breath at rest °• Chest pain °•  Redness, swelling, drainage, or foul odor at incision (surgical) sites °•  If your incisions open or pull apart °• Swelling or pain in calf (lower leg) °• Diarrhea (Loose bowel movements that happen often), frequent watery, uncontrolled bowel movements °• Constipation, (no bowel movements for 3 days) if this happens: Pick one °o Milk of Magnesia, 2 tablespoons by mouth, 3 times a day for 2 days if needed °o Stop taking Milk of Magnesia once you have a bowel movement °o Call your doctor if constipation continues °Or °o Miralax  (instead of Milk of Magnesia) following the label instructions °o Stop taking Miralax once you have a bowel movement °o Call your doctor if constipation continues °• Anything you think is not normal °  °Normal side effects after surgery: • Unable to sleep at night or unable to focus °• Irritability or moody °• Being tearful (crying) or depressed °These are common complaints, possibly related to your anesthesia medications that put you to sleep, stress of surgery, and change in lifestyle.  This usually goes away a few weeks after surgery.  If these feelings continue, call your primary care doctor. °  °Wound Care: You may have surgical glue, steri-strips, or staples over your incisions after  surgery °• Surgical glue:  Looks like a clear film over your incisions and will wear off a little at a time °• Steri-strips: Strips of tape over your incisions. You may notice a yellowish color on the skin under the steri-strips. This is used to make the   steri-strips stick better. Do not pull the steri-strips off - let them fall off °• Staples: Staples may be removed before you leave the hospital °o If you go home with staples, call [facility], [phone] at for an appointment with your surgeon’s nurse to have staples removed 10 days after surgery. °• Showering: You may shower two (2) days after your surgery unless your surgeon tells you differently °o Wash gently around incisions with warm soapy water, rinse well, and gently pat dry  °o No tub baths until staples are removed, steri-strips fall off or glue is gone.  °  °Medications: • Medications should be liquid or crushed if larger than the size of a dime °• Extended release pills (medication that release a little bit at a time through the day) should NOT be crushed or cut. (examples include XL, ER, DR, SR) °• Depending on the size and number of medications you take, you may need to space (take a few throughout the day)/change the time you take your medications so that you do not over-fill your pouch (smaller stomach) °• Make sure you follow-up with your primary care doctor to   make medication changes needed during rapid weight loss and life-style changes °• If you have diabetes, follow up with the doctor that orders your diabetes medication(s) within one week after surgery and check your blood sugar regularly. °• Do not drive while taking prescription pain medication  °• It is ok to take Tylenol by the bottle instructions with your pain medicine or instead of your pain medicine as needed.  DO NOT TAKE NSAIDS (EXAMPLES OF NSAIDS:  IBUPROFREN/ NAPROXEN)  °Diet:                    First 2 Weeks ° You will see the dietician t about two (2) weeks  after your surgery. The dietician will increase the types of foods you can eat if you are handling liquids well: °• If you have severe vomiting or nausea and cannot keep down clear liquids lasting longer than 1 day, call your surgeon @ ([phone]) °Protein Shake °• Drink at least 2 ounces of shake 5-6 times per day °• Each serving of protein shakes (usually 8 - 12 ounces) should have: °o 15 grams of protein  °o And no more than 5 grams of carbohydrate  °• Goal for protein each day: °o Men = 80 grams per day °o Women = 60 grams per day °• Protein powder may be added to fluids such as non-fat milk or Lactaid milk or unsweetened Soy/Almond milk (limit to 35 grams added protein powder per serving) ° °Hydration °• Slowly increase the amount of water and other clear liquids as tolerated (See Acceptable Fluids) °• Slowly increase the amount of protein shake as tolerated  °•  Sip fluids slowly and throughout the day.  Do not use straws. °• May use sugar substitutes in small amounts (no more than 6 - 8 packets per day; i.e. Splenda) ° °Fluid Goal °• The first goal is to drink at least 8 ounces of protein shake/drink per day (or as directed by the nutritionist); some examples of protein shakes are Syntrax Nectar, Adkins Advantage, EAS Edge HP, and Unjury. See handout from pre-op Bariatric Education Class: °o Slowly increase the amount of protein shake you drink as tolerated °o You may find it easier to slowly sip shakes throughout the day °o It is important to get your proteins in first °• Your fluid goal is to drink 64 - 100 ounces of fluid daily °o It may take a few weeks to build up to this °• 32 oz (or more) should be clear liquids  °And  °• 32 oz (or more) should be full liquids (see below for examples) °• Liquids should not contain sugar, caffeine, or carbonation ° °Clear Liquids: °• Water or Sugar-free flavored water (i.e. Fruit H2O, Propel) °• Decaffeinated coffee or tea (sugar-free) °• Crystal Lite, Wyler’s Lite,  Minute Maid Lite °• Sugar-free Jell-O °• Bouillon or broth °• Sugar-free Popsicle:   *Less than 20 calories each; Limit 1 per day ° °Full Liquids: °Protein Shakes/Drinks + 2 choices per day of other full liquids °• Full liquids must be: °o No More Than 15 grams of Carbs per serving  °o No More Than 3 grams of Fat per serving °• Strained low-fat cream soup (except Cream of Potato or Tomato) °• Non-Fat milk °• Fat-free Lactaid Milk °• Unsweetened Soy Or Unsweetened Almond Milk °• Low Sugar yogurt (Dannon Lite & Fit, Greek yogurt; Oikos Triple Zero; Chobani Simply 100; Yoplait 100 calorie Greek - No Fruit on the Bottom) ° °  °Vitamins   and Minerals • Start 1 day after surgery unless otherwise directed by your surgeon °• 2 Chewable Bariatric Specific Multivitamin / Multimineral Supplement with iron (Example: Bariatric Advantage Multi EA) °• Chewable Calcium with Vitamin D-3 °(Example: 3 Chewable Calcium Plus 600 with Vitamin D-3) °o Take 500 mg three (3) times a day for a total of 1500 mg each day °o Do not take all 3 doses of calcium at one time as it may cause constipation, and you can only absorb 500 mg  at a time  °o Do not mix multivitamins containing iron with calcium supplements; take 2 hours apart °• Menstruating women and those with a history of anemia (a blood disease that causes weakness) may need extra iron °o Talk with your doctor to see if you need more iron °• Do not stop taking or change any vitamins or minerals until you talk to your dietitian or surgeon °• Your Dietitian and/or surgeon must approve all vitamin and mineral supplements °  °Activity and Exercise: Limit your physical activity as instructed by your doctor.  It is important to continue walking at home.  During this time, use these guidelines: °• Do not lift anything greater than ten (10) pounds for at least two (2) weeks °• Do not go back to work or drive until your surgeon says you can °• You may have sex when you feel comfortable  °o It is  VERY important for female patients to use a reliable birth control method; fertility often increases after surgery  °o All hormonal birth control will be ineffective for 30 days after surgery due to medications given during surgery a barrier method must be used. °o Do not get pregnant for at least 18 months °• Start exercising as soon as your doctor tells you that you can °o Make sure your doctor approves any physical activity °• Start with a simple walking program °• Walk 5-15 minutes each day, 7 days per week.  °• Slowly increase until you are walking 30-45 minutes per day °Consider joining our BELT program. [phone] or email [email or web address] °  °Special Instructions Things to remember: °• Use your CPAP when sleeping if this applies to you ° °• [facility] has two free Bariatric Surgery Support Groups that meet monthly °o The 3rd Thursday of each month, 6 pm, [facility]  °o The 2nd Friday of each month, 11:45 am in the private dining room in the basement of [place] °• It is very important to keep all follow up appointments with your surgeon, dietitian, primary care physician, and behavioral health practitioner °• Routine follow up schedule with your surgeon include appointments at 2-3 weeks, 6-8 weeks, 6 months, and 1 year at a minimum.  Your surgeon may request to see you more often.   °o After the first year, please follow up with your bariatric surgeon and dietitian at least once a year in order to maintain best weight loss results °[facility]: [phone] °[facility]: [phone] °Bariatric Nurse Coordinator: [phone] °  °   Reviewed and Endorsed  °by [facility] Patient Education Committee, June, 2016 °Edits Approved: Aug, 2018 ° ° ° °

## 2018-07-06 NOTE — H&P (Signed)
History of Present Illness Cristina [name] is a 57 year old female who presents with obesity. [name] returns for her preoperative visit prior to planned laparoscopic Roux-en-Y gastric bypass. Her original presentation was as below:  [name] is referred by Dr Reginia Forts for consideration for surgical treatment for morbid obesity. Cristina [name] gives a history of progressive obesity since adolescence despite multiple attempts at medical management. She has been through innumerable efforts at supervised and unsupervised diet and exercise programs and able to lose a modest amount of weight but then experiences progressive weight regain. I actually seen her about 3 years ago to consider bariatric surgery but this had to be postponed for financial reasons. Since then she has again been through physician supervised weight loss efforts without significant success Obesity has been affecting Cristina [name] in a number of ways including significant restriction in activities due to chronic joint pain.. Significant co-morbid illnesses have developed including type 2 diabetes requiring multiple medications, steatohepatitis, significant GERD requiring medications, obstructive sleep apnea, dyslipidemia and osteoarthritis that will require knee replacement. Cristina [name] has been to our initial information seminar, researched surgical options thoroughly, and is interested in gastric bypass due to her diabetes and GERD. She has had a previous cardiac workup due to some chest pain which was negative and her pain was felt to be noncardiac. Her surgical history is significant for open cholecystectomy as well as hysterectomy.  She has successfully completed her preoperative workup. She has had her nutrition and psych evaluations. I reviewed lab work and chest x-ray and ultrasound significant only for some mild hepatomegaly and steatohepatitis. She states that she completed her lab work for vitamin levels and H. pylori breath  test but I cannot find results of our need to confirm these preoperatively. No change in her medications or new illnesses or hospitalizations since her original presentation.    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 Atorvastatin Calcium (20MG Tablet, Oral) Active. Albuterol (90MCG/ACT Aerosol Soln, Inhalation) Active. Anucort-HC (25MG Suppository, Rectal) Active. Azelastine HCl (0.1% Solution, Nasal) Active. Gabapentin (300MG Capsule, Oral) Active. GlipiZIDE ER (10MG Tablet ER 24HR, Oral) Active. Hydrocortisone (2.5% Ointment, External) Active. ROPINIRole HCl (1MG Tablet, Oral) Active. DILTIAZEM GEL (2% Gel, External) Active. Cetirizine HCl (10MG Tablet, Oral) Active. Nitroglycerin (0.4MG Tab Sublingual, Sublingual) Active. Biotin (5000MCG Tablet, Oral) Active. Glucosamine HCl (1000MG Tablet, Oral) Active. Aspirin (81MG Tablet, Oral) Active. Iron (325 (65 Fe)MG Tablet, Oral) Active. Calcium Carb-Cholecalciferol (1000-800MG-UNIT Tablet, Oral) Active. Multiple Vitamin (Oral) Active. ALPRAZolam (0.5MG Tablet, Oral) Active. Zolpidem Tartrate (5MG Tablet, Oral) Active. Ibuprofen (800MG Tablet, Oral) Active. Simvastatin (40MG Tablet, Oral) Active. TraMADol HCl (50MG Tablet, Oral) Active. Diclofenac  Sodium (1% Gel, Transdermal) Active. Jardiance (25MG Tablet, Oral) Active. Vitamin D (1000UNIT Tablet, Oral) Active. Victoza (18MG/3ML Soln Pen-inj, Subcutaneous) Active. Hydrocodone-Acetaminophen (5-325MG Tablet, Oral) Active. Cyclobenzaprine HCl (10MG Tablet, Oral) Active. Meloxicam (15MG Tablet, Oral) Active. Acyclovir (400MG Tablet,  Oral) Active. Fluticasone Propionate (50MCG/ACT Suspension, Nasal) Active. MetFORMIN HCl (500MG Tablet, Oral) Active. Montelukast Sodium (10MG Tablet, Oral) Active. Pantoprazole Sodium (40MG Tablet DR, Oral) Active. Sertraline HCl (100MG Tablet, Oral) Active. TRUEtest Test (In Vitro) Active. TRUEplus Lancets 30G Active. Medications Reconciled  Social History  Alcohol use  Recently quit alcohol use. Caffeine use  Tea. Illicit drug use  Remotely quit drug use. Tobacco use  Former smoker.  Family History  Alcohol Abuse  Father. Arthritis  Mother. Colon Cancer  Mother. Colon Polyps  Brother. Diabetes Mellitus  Daughter, Mother. Hypertension  Daughter, Mother.  Pregnancy / Birth History  Age at menarche  13 years. Age of menopause  29-55 Gravida  3 Maternal age  3-20 Para  3  Other Problems Arthritis  Back Pain  Cholelithiasis  Depression  Diabetes Mellitus  Gastroesophageal Reflux Disease  Hemorrhoids  High blood pressure  Hypercholesterolemia  Sleep Apnea   Vitals   Weight: 238.4 lb Height: 61in Body Surface Area: 2.04 m Body Mass Index: 45.04 kg/m  Temp.: 98.14F  Pulse: 92 (Regular)  BP: 120/82 (Sitting, Left Arm, Standard)       Physical Exam  Cristina physical exam findings are as follows: Note:General: Alert, morbidly obese Caucasian female with marked central obesity, in no distress Skin: Warm and dry without rash or infection. HEENT: No palpable masses or thyromegaly. Sclera nonicteric. Pupils equal round and reactive. Oropharynx clear. Lymph nodes: No cervical,  supraclavicular, nodes palpable. Lungs: Breath sounds clear and equal. No wheezing or increased work of breathing. Cardiovascular: Regular rate and rhythm without murmer. Abdomen: well-healed long Coker incision. Obese. Nondistended. Soft and nontender. No masses palpable. No organomegaly. No palpable hernias. Extremities: No edema or joint swelling or deformity. No chronic venous stasis changes. Neurologic: Alert and fully oriented. Gait normal. No focal weakness. Psychiatric: Normal mood and affect. Thought content appropriate with normal judgement and insight    Assessment & Plan  OBESITY, MORBID, BMI 40.0-49.9 (E66.01) Impression: [name] with progressive morbid obesity unresponsive to multiple efforts at medical management who presents with a BMI of 45 and comorbidities of type 2 diabetes, osteoarthritis, obstructive sleep apnea, steatohepatitis, GERD. I believe there would be very significant medical benefit from surgical weight loss. After our discussion of surgical options currently available Cristina [name] has decided to proceed with laparoscopic Roux-en-Y gastric bypass due to Cristina reasons above. We have discussed Cristina nature of Cristina problem and Cristina risks of remaining morbidly obese. We discussed laparoscopic Roux-en-Y gastric bypass in detail including Cristina nature of Cristina procedure, expected hospitalization and recovery, and major risks of anesthetic complications, bleeding, blood clots and pulmonary emboli, leakage and infection and long-term risks of stricture, ulceration, bowel obstruction, nutritional deficiencies, and failure to lose weight or weight regain. We discussed these problems could lead to death. she has completed her preoperative workup and is ready to proceed with surgery. We reviewed Cristina studies. I cannot locate H. pylori and vitamin studies today and will obtain copies of these. Ready to proceed with laparoscopic Roux-en-Y gastric bypass.

## 2018-07-07 ENCOUNTER — Encounter (HOSPITAL_COMMUNITY): Payer: Self-pay | Admitting: General Surgery

## 2018-07-07 LAB — CBC WITH DIFFERENTIAL/PLATELET
BASOS ABS: 0 10*3/uL (ref 0.0–0.1)
BASOS PCT: 0 %
Eosinophils Absolute: 0 10*3/uL (ref 0.0–0.7)
Eosinophils Relative: 0 %
HCT: 42.3 % (ref 36.0–46.0)
Hemoglobin: 13.2 g/dL (ref 12.0–15.0)
Lymphocytes Relative: 17 %
Lymphs Abs: 2.5 10*3/uL (ref 0.7–4.0)
MCH: 27.2 pg (ref 26.0–34.0)
MCHC: 31.2 g/dL (ref 30.0–36.0)
MCV: 87.2 fL (ref 78.0–100.0)
MONO ABS: 1 10*3/uL (ref 0.1–1.0)
MONOS PCT: 7 %
NEUTROS PCT: 76 %
Neutro Abs: 11 10*3/uL — ABNORMAL HIGH (ref 1.7–7.7)
Platelets: 335 10*3/uL (ref 150–400)
RBC: 4.85 MIL/uL (ref 3.87–5.11)
RDW: 15.7 % — AB (ref 11.5–15.5)
WBC: 14.5 10*3/uL — ABNORMAL HIGH (ref 4.0–10.5)

## 2018-07-07 LAB — GLUCOSE, CAPILLARY
GLUCOSE-CAPILLARY: 143 mg/dL — AB (ref 70–99)
GLUCOSE-CAPILLARY: 147 mg/dL — AB (ref 70–99)
GLUCOSE-CAPILLARY: 118 mg/dL — AB (ref 70–99)
Glucose-Capillary: 134 mg/dL — ABNORMAL HIGH (ref 70–99)
Glucose-Capillary: 145 mg/dL — ABNORMAL HIGH (ref 70–99)
Glucose-Capillary: 156 mg/dL — ABNORMAL HIGH (ref 70–99)

## 2018-07-07 MED ORDER — ROPINIROLE HCL 1 MG PO TABS
1.0000 mg | ORAL_TABLET | Freq: Every day | ORAL | Status: DC
  Administered 2018-07-07: 1 mg via ORAL
  Filled 2018-07-07: qty 1

## 2018-07-07 MED ORDER — ATORVASTATIN CALCIUM 20 MG PO TABS
20.0000 mg | ORAL_TABLET | Freq: Every day | ORAL | Status: DC
  Administered 2018-07-07: 20 mg via ORAL
  Filled 2018-07-07: qty 1

## 2018-07-07 MED ORDER — BUPROPION HCL ER (SR) 100 MG PO TB12
200.0000 mg | ORAL_TABLET | Freq: Every day | ORAL | Status: DC
  Administered 2018-07-07 – 2018-07-08 (×2): 200 mg via ORAL
  Filled 2018-07-07 (×2): qty 2

## 2018-07-07 MED ORDER — SERTRALINE HCL 50 MG PO TABS
150.0000 mg | ORAL_TABLET | Freq: Every day | ORAL | Status: DC
  Administered 2018-07-07: 150 mg via ORAL
  Filled 2018-07-07: qty 1

## 2018-07-07 MED ORDER — MONTELUKAST SODIUM 10 MG PO TABS
10.0000 mg | ORAL_TABLET | Freq: Every day | ORAL | Status: DC
  Administered 2018-07-07: 10 mg via ORAL
  Filled 2018-07-07: qty 1

## 2018-07-07 MED ORDER — GABAPENTIN 300 MG PO CAPS
300.0000 mg | ORAL_CAPSULE | Freq: Three times a day (TID) | ORAL | Status: DC
  Administered 2018-07-07 – 2018-07-08 (×3): 300 mg via ORAL
  Filled 2018-07-07 (×3): qty 1

## 2018-07-07 NOTE — Progress Notes (Signed)
Patient alert and oriented, Post op day 1.  Provided support and encouragement.  Encouraged pulmonary toilet, ambulation and small sips of liquids.  Completed 12 ounces of bari clear fluids overnight started protein shakes. All questions answered.  Will continue to monitor.

## 2018-07-07 NOTE — Progress Notes (Signed)
1 Day Post-Op   Subjective: No complaints.  Has expected soreness.  Denies nausea and is tolerating protein shakes this morning.  Has been ambulatory.  Had a small bowel movement.  Objective: Vital signs in last 24 hours: Temp:  [97.9 F (36.6 C)-99.9 F (37.7 C)] 98.7 F (37.1 C) (08/27 1005) Pulse Rate:  [79-98] 83 (08/27 1005) Resp:  [16-19] 18 (08/27 1005) BP: (125-159)/(68-97) 150/78 (08/27 1005) SpO2:  [91 %-98 %] 95 % (08/27 1005) Last BM Date: 07/07/18  Intake/Output from previous day: 08/26 0701 - 08/27 0700 In: 3290.2 [P.O.:420; I.V.:2820.2; IV Piggyback:50] Out: 1925 [Urine:1900; Blood:25] Intake/Output this shift: Total I/O In: 409.1 [P.O.:320; I.V.:43.3; IV Piggyback:45.7] Out: 400 [Urine:400]  General appearance: alert, cooperative, no distress and morbidly obese GI: normal findings: soft, non-tender Incision/Wound: Slight bruising.  No erythema or drainage.  Lab Results:  Recent Labs    07/06/18 1225 07/06/18 2156 07/07/18 0529  WBC 10.4  --  14.5*  HGB 14.4 13.9 13.2  HCT 45.9 43.7 42.3  PLT 321  --  335   BMET Recent Labs    07/06/18 1225  NA 146*  K 3.9  CL 110  CO2 22  GLUCOSE 173*  BUN 17  CREATININE 0.93  CALCIUM 9.3     Studies/Results: No results found.  Anti-infectives: Anti-infectives (From admission, onward)   Start     Dose/Rate Route Frequency Ordered Stop   07/06/18 1245  cefoTEtan (CEFOTAN) 2 g in sodium chloride 0.9 % 100 mL IVPB     2 g 200 mL/hr over 30 Minutes Intravenous On call to O.R. 07/06/18 1224 07/06/18 1447      Assessment/Plan: s/p Procedure(s): LAPAROSCOPIC ROUX-EN-Y GASTRIC BYPASS WITH UPPER ENDOSCOPY Doing well without apparent complication.  Advanced to protein shakes.  Ambulation encouraged. Restarting some home medications. Have asked diabetes coordinator to make recommendations for changes at home. Expect discharge tomorrow.   LOS: 1 day    Edward Jolly 8/27/2019Patient ID: Cristina [name], female   DOB: [birth year], 57 y.o.   MRN: [id number]

## 2018-07-07 NOTE — Plan of Care (Signed)

## 2018-07-08 LAB — CBC WITH DIFFERENTIAL/PLATELET
Basophils Absolute: 0 10*3/uL (ref 0.0–0.1)
Basophils Relative: 0 %
Eosinophils Absolute: 0.1 10*3/uL (ref 0.0–0.7)
Eosinophils Relative: 0 %
HEMATOCRIT: 43.7 % (ref 36.0–46.0)
HEMOGLOBIN: 13.8 g/dL (ref 12.0–15.0)
LYMPHS ABS: 4.5 10*3/uL — AB (ref 0.7–4.0)
Lymphocytes Relative: 29 %
MCH: 27.5 pg (ref 26.0–34.0)
MCHC: 31.6 g/dL (ref 30.0–36.0)
MCV: 87.1 fL (ref 78.0–100.0)
Monocytes Absolute: 0.9 10*3/uL (ref 0.1–1.0)
Monocytes Relative: 6 %
NEUTROS ABS: 10.1 10*3/uL — AB (ref 1.7–7.7)
NEUTROS PCT: 65 %
Platelets: 337 10*3/uL (ref 150–400)
RBC: 5.02 MIL/uL (ref 3.87–5.11)
RDW: 15.7 % — ABNORMAL HIGH (ref 11.5–15.5)
WBC: 15.6 10*3/uL — ABNORMAL HIGH (ref 4.0–10.5)

## 2018-07-08 LAB — GLUCOSE, CAPILLARY
GLUCOSE-CAPILLARY: 142 mg/dL — AB (ref 70–99)
Glucose-Capillary: 151 mg/dL — ABNORMAL HIGH (ref 70–99)
Glucose-Capillary: 143 mg/dL — ABNORMAL HIGH (ref 70–99)
Glucose-Capillary: 141 mg/dL — ABNORMAL HIGH (ref 70–99)

## 2018-07-08 MED FILL — ONDANSETRON ODT 4 MG TABLET: 4 | 4 days supply | Qty: 15 | Fill #0

## 2018-07-08 NOTE — Progress Notes (Signed)
Patient ID: Cristina [name], female   DOB: [birth year], 57 y.o.   MRN: [id number] 2 Days Post-Op   Subjective: Feels "fine".  A little sore with movement but no pain.  Tolerating protein shakes with no nausea.  Gets a little temporary discomfort with medications that passes.  Objective: Vital signs in last 24 hours: Temp:  [98.8 F (37.1 C)-99.3 F (37.4 C)] 98.8 F (37.1 C) (08/28 0948) Pulse Rate:  [80-101] 92 (08/28 0948) Resp:  [16-20] 16 (08/28 0948) BP: (136-157)/(71-89) 144/79 (08/28 0948) SpO2:  [95 %-98 %] 96 % (08/28 0948) Weight:  [105.8 kg-110.5 kg] 105.8 kg (08/28 0500) Last BM Date: 07/07/18  Intake/Output from previous day: 08/27 0701 - 08/28 0700 In: 3902.8 [P.O.:880; I.V.:2061.7; IV Piggyback:961.1] Out: 2900 [Urine:2900] Intake/Output this shift: Total I/O In: -  Out: 1200 [Urine:1200]  General appearance: alert, cooperative and no distress GI: normal findings: soft, non-tender Incision/Wound: No erythema or drainage  Lab Results:  Recent Labs    07/07/18 0529 07/08/18 0536  WBC 14.5* 15.6*  HGB 13.2 13.8  HCT 42.3 43.7  PLT 335 337   BMET Recent Labs    07/06/18 1225  NA 146*  K 3.9  CL 110  CO2 22  GLUCOSE 173*  BUN 17  CREATININE 0.93  CALCIUM 9.3   CBG (last 3)  Recent Labs    07/08/18 0355 07/08/18 0758 07/08/18 0954  GLUCAP 151* 142* 143*     Studies/Results: No results found.  Anti-infectives: Anti-infectives (From admission, onward)   Start     Dose/Rate Route Frequency Ordered Stop   07/06/18 1245  cefoTEtan (CEFOTAN) 2 g in sodium chloride 0.9 % 100 mL IVPB     2 g 200 mL/hr over 30 Minutes Intravenous On call to O.R. 07/06/18 1224 07/06/18 1447      Assessment/Plan: s/p Procedure(s): LAPAROSCOPIC ROUX-EN-Y GASTRIC BYPASS WITH UPPER ENDOSCOPY Doing well postoperatively without complication.  Has mild to moderate leukocytosis but no other symptoms or findings of concern and I feel she is stable for discharge.   Patient has been evaluated by diabetes coordinator and we will send her home on metformin alone with glucose checks.   LOS: 2 days    Edward Jolly 07/08/2018

## 2018-07-08 NOTE — Care Management Note (Signed)
Case Management Note  Patient Details  Name: Cristina [name] MRN: [id number] Date of Birth: 04-Nov-1961  Subjective/Objective:No CM needs.                    Action/Plan:d/c home.   Expected Discharge Date:  07/08/18               Expected Discharge Plan:  Home/Self Care  In-House Referral:     Discharge planning Services  CM Consult  Post Acute Care Choice:    Choice offered to:     DME Arranged:    DME Agency:     HH Arranged:    HH Agency:     Status of Service:     If discussed at [employer], dates discussed:    Additional Comments:  Dessa Phi, RN 07/08/2018, 12:13 PM

## 2018-07-08 NOTE — Progress Notes (Signed)
Pt alert, oriented, tolerating liquids.  D/C instructions given, all questions answered. Pt was d/cd home.

## 2018-07-08 NOTE — Progress Notes (Signed)
Patient alert and oriented, pain is controlled. Patient is tolerating fluids, advanced to protein shake today, patient is tolerating well. Reviewed Gastric Bypass discharge instructions with patient and patient is able to articulate understanding. Provided information on BELT program, Support Group and [facility] outpatient pharmacy. All questions answered, will continue to monitor.   Total fluid intake 900 Per dehydration protocol call back 07-14-18

## 2018-07-08 NOTE — Discharge Summary (Signed)
Patient ID: Cristina [name] [id number] y.o. [birth year]  07/06/2018  Discharge date and time: 07/08/2018   Admitting Physician: Edward Jolly  Discharge Physician: Edward Jolly  Admission Diagnoses: Morbid obesity  Discharge Diagnoses: Same  Operations: Procedure(s): LAPAROSCOPIC ROUX-EN-Y GASTRIC BYPASS WITH UPPER ENDOSCOPY  Admission Condition: fair  Discharged Condition: fair  Indication for Admission: Patient with progressive morbid obesity unresponsive to multiple efforts at medical management who presents with a BMI of 45 and comorbidities of type 2 diabetes, osteoarthritis, obstructive sleep apnea, steatohepatitis, GERD.  After extensive preoperative evaluation and discussion detailed elsewhere we have elected to proceed with laparoscopic Roux-en-Y gastric bypass for treatment of her morbid obesity.  Hospital Course: On the morning of admission the patient underwent an uneventful laparoscopic Roux-en-Y gastric bypass.  Her postoperative course was unremarkable.  She was started on ice and water the day of surgery.  On the first postoperative day she was advanced to protein shakes and tolerated these well.  She was ambulatory.  Had minimal pain.  By the second postoperative day she denies pain.  Ambulatory.  Tolerating meds and protein shakes and fluids.  Blood sugars are well controlled on small doses of insulin.  She is felt ready for discharge.  Consults: Diabetes coordinator   Disposition: Home  Patient Instructions:  Allergies as of 07/08/2018      Reactions   Fish Oil Rash      Medication List    STOP taking these medications   empagliflozin 25 MG Tabs tablet Commonly known as:  JARDIANCE   glipiZIDE 10 MG 24 hr tablet Commonly known as:  GLUCOTROL XL   liraglutide 18 MG/3ML Sopn Commonly known as:  VICTOZA   naproxen 500 MG tablet Commonly known as:  NAPROSYN   Pen Needles 31G X 5 MM Misc     TAKE these medications   acyclovir 400 MG  tablet Commonly known as:  ZOVIRAX TAKE 1 TABLET BY MOUTH 2 TIMES DAILY.   albuterol 108 (90 Base) MCG/ACT inhaler Commonly known as:  PROVENTIL HFA;VENTOLIN HFA Inhale 2 puffs into the lungs every 6 (six) hours as needed. What changed:  reasons to take this   ANUCORT-HC 25 MG suppository Generic drug:  hydrocortisone PLACE 1 SUPPOSITORY RECTALLY 2 TIMES DAILY AS NEEDED FOR HEMORRHOIDS.   atorvastatin 20 MG tablet Commonly known as:  LIPITOR Take 1 tablet (20 mg total) by mouth daily. What changed:  when to take this   azelastine 0.05 % ophthalmic solution Commonly known as:  OPTIVAR Place 1 drop into both eyes daily as needed (allergies).   azelastine 0.1 % nasal spray Commonly known as:  ASTELIN PLACE 2 SPRAYS INTO BOTH NOSTRILS 2 TIMES DAILY AS DIRECTED What changed:    how much to take  how to take this  when to take this  reasons to take this  additional instructions   BIOTIN 5000 PO Take 5,000 mcg by mouth daily.   buPROPion 200 MG 12 hr tablet Commonly known as:  WELLBUTRIN SR Take 1 tablet (200 mg total) by mouth daily.   CALCIUM + D PO Take 1 tablet by mouth daily.   calcium carbonate 500 MG chewable tablet Commonly known as:  TUMS - dosed in mg elemental calcium Chew 3-4 tablets by mouth daily as needed for indigestion or heartburn.   cetirizine 10 MG tablet Commonly known as:  ZYRTEC Take 10 mg by mouth at bedtime.   diclofenac sodium 1 % Gel Commonly known as:  VOLTAREN Apply  4 g topically 4 (four) times daily. What changed:    when to take this  reasons to take this   diltiazem 2 % Gel Apply 1 application topically 3 (three) times daily. What changed:    when to take this  reasons to take this   ferrous sulfate 325 (65 FE) MG EC tablet Take 325 mg by mouth daily.   fluticasone 50 MCG/ACT nasal spray Commonly known as:  FLONASE PLACE 2 SPRAYS INTO BOTH NOSTRILS DAILY.   gabapentin 300 MG capsule Commonly known as:   NEURONTIN Take 1 capsule (300 mg total) by mouth 3 (three) times daily.   Glucosamine HCl 1000 MG Tabs Take 1,000 mg by mouth 2 (two) times daily with a meal.   glucose blood test strip accucheck guide strips and fast clix lancets Use as instructed dm 2   hydrocortisone 2.5 % ointment Apply topically 2 (two) times daily. What changed:    how much to take  when to take this  reasons to take this   metFORMIN 1000 MG tablet Commonly known as:  GLUCOPHAGE TAKE 1 TABLET BY MOUTH 2 TIMES DAILY WITH A MEAL. Notes to patient:  Monitor Blood Sugar Frequently and keep a log for primary care physician, you may need to adjust medication dosage with rapid weight loss.     montelukast 10 MG tablet Commonly known as:  SINGULAIR Take 1 tablet (10 mg total) by mouth at bedtime.   multivitamin capsule Take 1 capsule by mouth daily.   nitroGLYCERIN 0.4 MG SL tablet Commonly known as:  NITROSTAT Place 1 tablet (0.4 mg total) under the tongue every 5 (five) minutes as needed for chest pain.   pantoprazole 40 MG tablet Commonly known as:  PROTONIX TAKE 1 TABLET BY MOUTH 2 TIMES DAILY.   rOPINIRole 1 MG tablet Commonly known as:  REQUIP Take 1 tablet (1 mg total) by mouth at bedtime.   sertraline 100 MG tablet Commonly known as:  ZOLOFT Take 1.5 tablets (150 mg total) by mouth daily. What changed:  when to take this   traMADol 50 MG tablet Commonly known as:  ULTRAM Take 1 tablet (50 mg total) by mouth every 6 (six) hours as needed.   TRUEPLUS LANCETS 30G Misc USE TO CHECK BLOOD GLUCOSE DAILY AS DIRECTED       Activity: activity as tolerated Diet: Bariatric protein shakes Wound Care: none needed  Follow-up:  With Magdiel Bartles in 3 weeks.  Signed: Edward Jolly MD, FACS  07/08/2018, 11:15 AM

## 2018-07-09 ENCOUNTER — Other Ambulatory Visit: Payer: Self-pay | Admitting: *Deleted

## 2018-07-09 NOTE — Patient Outreach (Signed)
[facility]) Care Management  07/09/2018  Cristina [name] 16-Mar-1961 [id number]   Subjective: Telephone call to patient's home  / mobile number, no answer, left HIPAA compliant voicemail message, and requested call back.    Objective:Per KPN (Knowledge Performance Now, point of care tool) and chart review,patient hospitalized  07/06/18 - 07/08/18 for morbid obesity, status post Laparoscopic Roux-en-Y gastric bypass on 07/06/18. Patient also has a history of diabetes, hypertension, OSA on CPAP, Iron deficiency anemia, IBS,Fibromyalgia,Osteoarthritis,Metatarsalgia of both feet,Pure hypercholesterolemia,Rectocele, and TMJ (dislocation of temporomandibular joint). Patient transition from [place] to [facility] diabetes management program to [facility] program in January of 2018.     Assessment: Received [facility]MR Transition of care referral on 06/16/18.Brief preoperative follow up completed.  Transition of care follow up pending patient contact.      Plan:RNCM will send unsuccessful outreach  letter, [facility] pamphlet, will call patient for 2nd telephone outreach attempt, transition of care follow up, and proceed with case closure, within 10 business days if no return call.     Ameya Vowell H. Annia Friendly, BSN, [place] Management [facility] Telephonic CM Phone: [phone] Fax: [phone]

## 2018-07-10 ENCOUNTER — Other Ambulatory Visit: Payer: Self-pay | Admitting: *Deleted

## 2018-07-10 ENCOUNTER — Encounter: Payer: Self-pay | Admitting: *Deleted

## 2018-07-10 ENCOUNTER — Other Ambulatory Visit: Payer: Self-pay

## 2018-07-10 ENCOUNTER — Other Ambulatory Visit: Payer: Self-pay | Admitting: Family Medicine

## 2018-07-10 MED ORDER — MONTELUKAST SODIUM 10 MG PO TABS: 10.0000 mg | ORAL_TABLET | Freq: Every day | ORAL | 0 refills | Status: DC

## 2018-07-10 MED FILL — MONTELUKAST SOD 10 MG TAB: 10 | 30 days supply | Qty: 30 | Fill #0

## 2018-07-10 MED FILL — PANTOPRAZOLE SOD DR 40 MG T: 40 | 90 days supply | Qty: 180 | Fill #0

## 2018-07-10 NOTE — Patient Outreach (Addendum)
Cristina [name] [facility]) Care Management  07/10/2018  Cristina [name] 1960-11-20 [id number]   Subjective: Telephone call to patient's home / mobile number, spoke with patient, and HIPAA verified.  Discussed [facility] Care Management UMR Transition of care follow up, patient voiced understanding, and is in agreement to follow up.   Patient states she is doing well and surgery went good.   States she has a follow up appointment with surgeon on 07/21/18 and with primary MD on 07/16/18.   Patient states she is able to manage self care and has assistance as needed.  Patient voices understanding of medical diagnosis, surgery, and treatment plan.  [facility] benefits discussed on 06/18/18 preoperative call and patient states no additional questions at this time.   Patient states she does not have any education material, transition of care, care coordination, disease management, disease monitoring, transportation, community resource, or pharmacy needs at this time.  States she is very appreciative of the follow up and is in agreement to receive [place] Management information.     Objective:Per KPN (Knowledge Performance Now, [name] of care tool) and chart review,patient to be admitted on 06/12/18 for morbid obesity, and discharge date unknown. Patient also has a history of diabetes, hypertension, OSA on CPAP, Iron deficiency anemia, IBS,Fibromyalgia,Osteoarthritis,Metatarsalgia of both feet,Pure hypercholesterolemia,Rectocele, and TMJ (dislocation of temporomandibular joint). Patient transition from [place] to Wellness diabetes management program to [facility] program in January of 2018.     Assessment: Received UMR Transition of care referral on 06/16/18.Brief preoperative follow up completed and Transition of care follow up completed, no care management needs, and will proceed with case closure.      Plan:RNCM will send patient successful outreach letter, [facility] pamphlet, and  magnet. RNCM will complete case closure due to follow up completed / no care management needs.        Cristina [name] H. Annia Friendly, BSN, [place] Management [facility] Telephonic CM Phone: [phone] Fax: [phone]

## 2018-07-14 ENCOUNTER — Telehealth (HOSPITAL_COMMUNITY): Payer: Self-pay

## 2018-07-14 NOTE — Telephone Encounter (Addendum)
Left voicemail to discuss post bariatric surgery follow up questions see below.  Await response     1.  Tell me about your pain and pain management?no pain other than gas pain  2.  Let's talk about fluid intake.  How much total fluid are you taking in?60+ ounces of fluid 3.  How much protein have you taken in the last 2 days?60  4.  Have you had nausea?  Tell me about when have experienced nausea and what you did to help?a little nausea not required medication  5.  Has the frequency or color changed with your urine?urinating clear no problem  6.  Tell me what your incisions look like?incision look good  7.  Have you been passing gas? BM?frequent bms lots of gas always after protein, ?lactose tolerance sounds like she has had problems in past with milk based products.  She is going to try plant protein with almond milk.  She was encouraged to call if frequent bm continue or feels she is dehdrated.  Signs and symptoms for dehydration discussed with patient.  8.  If a problem or question were to arise who would you call?  Do you know contact numbers for [place], [facility], and [facility]?  9.  How has the walking going?ambulating regularly  10.  How are your vitamins and calcium going?  How are you taking them?no problem with vitamins or calcium

## 2018-07-15 ENCOUNTER — Telehealth (HOSPITAL_COMMUNITY): Payer: Self-pay

## 2018-07-15 NOTE — Telephone Encounter (Signed)
Patient called to discuss post bariatric surgery follow up questions.  See below: ° ° °1.  Tell me about your pain and pain management? ° °2.  Let's talk about fluid intake.  How much total fluid are you taking in? ° °3.  How much protein have you taken in the last 2 days? ° °4.  Have you had nausea?  Tell me about when have experienced nausea and what you did to help? ° °5.  Has the frequency or color changed with your urine? ° °6.  Tell me what your incisions look like? ° °7.  Have you been passing gas? BM? ° °8.  If a problem or question were to arise who would you call?  Do you know contact numbers for [facility], [facility], and [facility]? ° °9.  How has the walking going? ° °10.  How are your vitamins and calcium going?  How are you taking them? °

## 2018-07-16 DIAGNOSIS — I10 Essential (primary) hypertension: Secondary | ICD-10-CM | POA: Diagnosis not present

## 2018-07-16 DIAGNOSIS — E119 Type 2 diabetes mellitus without complications: Secondary | ICD-10-CM | POA: Diagnosis not present

## 2018-07-16 DIAGNOSIS — E7800 Pure hypercholesterolemia, unspecified: Secondary | ICD-10-CM | POA: Diagnosis not present

## 2018-07-21 ENCOUNTER — Encounter: Payer: 59 | Attending: General Surgery | Admitting: Registered"

## 2018-07-21 DIAGNOSIS — Z713 Dietary counseling and surveillance: Secondary | ICD-10-CM | POA: Diagnosis not present

## 2018-07-21 DIAGNOSIS — Z6841 Body Mass Index (BMI) 40.0 and over, adult: Secondary | ICD-10-CM | POA: Diagnosis not present

## 2018-07-21 DIAGNOSIS — E119 Type 2 diabetes mellitus without complications: Secondary | ICD-10-CM

## 2018-07-22 NOTE — Progress Notes (Signed)
Bariatric Class:  Appt start time: 1530 end time:  1630.  2 Week Post-Operative Nutrition Class  Patient was seen on 07/21/2018 for Post-Operative [facility]   Surgery date: 07/06/2018 Surgery type: RYGB Start weight at [facility]: 235.6 Weight today: 223.2 Weight change: 12.4 lbs loss  Pt states she is consuming about 64 oz of fluid a day. Pt states she has had some headaches and abdominal pain. Pt states she checks BS 2x/day (151); recent A1c of 8.1.   TANITA  BODY COMP RESULTS  07/21/2018   BMI (kg/m^2) 43.6   Fat Mass (lbs) 114.8   Fat Free Mass (lbs) 108.4   Total Body Water (lbs) 78.6   The following the learning objectives were met by the patient during this course:  Identifies Phase 3A (Soft, High Proteins) Dietary Goals and will begin from 2 weeks post-operatively to 2 months post-operatively  Identifies appropriate sources of fluids and proteins   States protein recommendations and appropriate sources post-operatively  Identifies the need for appropriate texture modifications, mastication, and bite sizes when consuming solids  Identifies appropriate multivitamin and calcium sources post-operatively  Describes the need for physical activity post-operatively and will follow MD recommendations  States when to call healthcare provider regarding medication questions or post-operative complications  Handouts given during class include:  Phase 3A: Soft, High Protein Diet Handout  Follow-Up Plan: Patient will follow-up at [facility] in 6 weeks for 2 month post-op nutrition visit for diet advancement per MD.

## 2018-07-29 ENCOUNTER — Telehealth: Payer: Self-pay | Admitting: Skilled Nursing Facility1

## 2018-07-29 NOTE — Telephone Encounter (Signed)
Dietitian called pt to assess her progress from liquid to solid phase.  Pt states every time she ate it felt it was hanging and was hurting with meat but now it is all better.   Protein grams: 60  Fluid Ounces: 64+

## 2018-08-01 DIAGNOSIS — Z9884 Bariatric surgery status: Secondary | ICD-10-CM | POA: Insufficient documentation

## 2018-08-11 ENCOUNTER — Other Ambulatory Visit: Payer: Self-pay | Admitting: Family Medicine

## 2018-08-11 MED FILL — metFORMIN HCL 1000 MG TABS: 1000 | 90 days supply | Qty: 180 | Fill #1

## 2018-08-11 MED FILL — BUPROPION HCL SR 200 MG TAB: 200 | 90 days supply | Qty: 90 | Fill #1

## 2018-08-11 MED FILL — rOPINIRole HCL 1 MG TABS: 1 | 90 days supply | Qty: 90 | Fill #0

## 2018-08-11 MED FILL — SERTRALINE HCL 100 MG TAB: 100 | 90 days supply | Qty: 135 | Fill #0

## 2018-08-11 NOTE — Telephone Encounter (Signed)
Requested medication (s) are due for refill today yes  Requested medication (s) are on the active medication list yes  Future visit scheduled no- patient of Dr Tamala Julian- no follow up appointment    Requested Prescriptions  Pending Prescriptions Disp Refills   acyclovir (ZOVIRAX) 400 MG tablet [Pharmacy Med Name: ACYCLOVIR 400 MG TABLET 400 TAB] 180 tablet 3    Sig: TAKE 1 TABLET BY MOUTH 2 TIMES DAILY.     Antimicrobials:  Antiviral Agents - Anti-Herpetic Passed - 08/11/2018  2:21 PM      Passed - Valid encounter within last 12 months    Recent Outpatient Visits          4 months ago Essential hypertension   Primary Care at [facility], Renette Butters, MD   5 months ago Sinusitis, unspecified chronicity, unspecified location   Primary Care at [place], PA-C   6 months ago Sinusitis, unspecified chronicity, unspecified location   Primary Care at [facility], Gelene Mink, PA-C   7 months ago Type 2 diabetes mellitus with diabetic polyneuropathy, without long-term current use of insulin [facility])   Primary Care at [facility], Renette Butters, MD   7 months ago Thoracic myofascial strain, initial encounter   Primary Care at [place], [place], [place]      Future Appointments            In 2 weeks Excell Seltzer, MD    In 1 month Louanne Skye, Daleen Bo, MD [employer]

## 2018-08-13 ENCOUNTER — Other Ambulatory Visit: Payer: Self-pay | Admitting: Family Medicine

## 2018-08-21 ENCOUNTER — Ambulatory Visit: Payer: Self-pay

## 2018-08-21 DIAGNOSIS — E669 Obesity, unspecified: Secondary | ICD-10-CM | POA: Diagnosis not present

## 2018-08-21 DIAGNOSIS — E119 Type 2 diabetes mellitus without complications: Secondary | ICD-10-CM | POA: Diagnosis not present

## 2018-08-21 DIAGNOSIS — F419 Anxiety disorder, unspecified: Secondary | ICD-10-CM | POA: Diagnosis not present

## 2018-08-21 DIAGNOSIS — Z9884 Bariatric surgery status: Secondary | ICD-10-CM | POA: Diagnosis not present

## 2018-08-21 MED FILL — AZELASTINE HCL 0.05% DROPS: 0.05 | 30 days supply | Qty: 6 | Fill #0

## 2018-08-21 MED FILL — ACYCLOVIR 400 MG TABLET: 400 | 90 days supply | Qty: 180 | Fill #0

## 2018-08-25 ENCOUNTER — Other Ambulatory Visit: Payer: Self-pay | Admitting: Family Medicine

## 2018-08-25 NOTE — Telephone Encounter (Signed)
Requested medication (s) are due for refill today -yes  Requested medication (s) are on the active medication list -yes  Future visit scheduled -no  Last refill: 07/10/18  Notes to clinic: patient is former Zannie Cove, MD. Please review for refill  Requested Prescriptions  Pending Prescriptions Disp Refills   montelukast (SINGULAIR) 10 MG tablet [Pharmacy Med Name: MONTELUKAST SOD 10 MG TAB 10 TAB] 30 tablet 0    Sig: Take 1 tablet (10 mg total) by mouth at bedtime.     Pulmonology:  Leukotriene Inhibitors Passed - 08/25/2018  2:25 PM      Passed - Valid encounter within last 12 months    Recent Outpatient Visits          4 months ago Essential hypertension   Primary Care at [facility], Renette Butters, MD   6 months ago Sinusitis, unspecified chronicity, unspecified location   Primary Care at [place], [place]   6 months ago Sinusitis, unspecified chronicity, unspecified location   Primary Care at [facility], Gelene Mink, PA-C   7 months ago Type 2 diabetes mellitus with diabetic polyneuropathy, without long-term current use of insulin [facility])   Primary Care at [facility], Renette Butters, MD   8 months ago Thoracic myofascial strain, initial encounter   Primary Care at [place], [place], [place]      Future Appointments            In 2 days Excell Seltzer, MD    In 3 weeks Jessy Oto, MD [employer]            Requested Prescriptions  Pending Prescriptions Disp Refills   montelukast (SINGULAIR) 10 MG tablet [Pharmacy Med Name: MONTELUKAST SOD 10 MG TAB 10 TAB] 30 tablet 0    Sig: Take 1 tablet (10 mg total) by mouth at bedtime.     Pulmonology:  Leukotriene Inhibitors Passed - 08/25/2018  2:25 PM      Passed - Valid encounter within last 12 months    Recent Outpatient Visits          4 months ago Essential hypertension   Primary Care at [facility], Renette Butters, MD   6 months ago Sinusitis, unspecified chronicity, unspecified  location   Primary Care at [place], [place]   6 months ago Sinusitis, unspecified chronicity, unspecified location   Primary Care at [facility], Gelene Mink, PA-C   7 months ago Type 2 diabetes mellitus with diabetic polyneuropathy, without long-term current use of insulin [facility])   Primary Care at [facility], Renette Butters, MD   8 months ago Thoracic myofascial strain, initial encounter   Primary Care at [place], Dorian Heckle, [place]      Future Appointments            In 2 days Excell Seltzer, MD    In 3 weeks Jessy Oto, MD [facility]

## 2018-08-26 MED FILL — MONTELUKAST SOD 10 MG TAB: 10 | 30 days supply | Qty: 30 | Fill #0

## 2018-08-31 ENCOUNTER — Encounter: Payer: Self-pay | Admitting: Skilled Nursing Facility1

## 2018-08-31 ENCOUNTER — Encounter: Payer: 59 | Attending: General Surgery | Admitting: Skilled Nursing Facility1

## 2018-08-31 DIAGNOSIS — Z713 Dietary counseling and surveillance: Secondary | ICD-10-CM | POA: Insufficient documentation

## 2018-08-31 DIAGNOSIS — E669 Obesity, unspecified: Secondary | ICD-10-CM

## 2018-08-31 DIAGNOSIS — Z6841 Body Mass Index (BMI) 40.0 and over, adult: Secondary | ICD-10-CM | POA: Insufficient documentation

## 2018-08-31 NOTE — Progress Notes (Signed)
Follow-up visit:  8 Weeks Post-Operative RYGB Surgery   Pt states she is only taking metformin for diabetes now.  Pt arrived visibly irritated further elucidated by frustrations listed below. Pt states she cannot eat eggs due to feeling like they choke her, chicken feeling like it gets hung in her throat then saying it depends on how fast she eats and how much she is chewing. Pt states she has had diarrhea since surgery.  Pt states she has School work to do and watch her grandkids with daughter looking for a house on top of having to care about how and what she eats and drinks so she has been feeling frustrate. Pt states she does not see a problem with skipping meals, she has always not eaten 3 meals a day. Pt states cold fluids cause discomfort in her stomach (unclear if it is pain, discomfort or just different). Pt is struggling to meet her fluid needs.    Surgery date: 07/06/2018 Surgery type: RYGB [facility] weight at [facility]: 235.6 Weight today: 207.6 Weight change: 16 lbs loss  TANITA  BODY COMP RESULTS  07/21/2018 08/31/2018   BMI (kg/m^2) 43.6 40.5   Fat Mass (lbs) 114.8 101.6   Fat Free Mass (lbs) 108.4 106   Total Body Water (lbs) 78.6 76.2   24-hr recall: 3 days a week eating 3 meals a day  B (7-7:30 AM): protein powder in almond milk Snk (AM): cheese slices  L (PM): pork and beans or pinto beans or hot dog with hot dog chili and mustard or hamburger meat and black beans and 8 tortilla chips with queso dip Snk (PM): [place] pepperoni and cheese D (PM): wendys chili Snk (PM): jello  Fluid intake: 1-2 bottles of water, natures twist, gatorade zero, regular lemonade Estimated total protein intake: 60+  Medications: see list  Supplementation: [employer] multivitamin and calcium   CBG monitoring: couple times a week Average CBG per patient: 130-140 Last patient reported A1c:   Using straws: no Drinking while eating: no Having you been chewing well:no Chewing/swallowing  difficulties: no Changes in vision: no Changes to mood/headaches: no Hair loss/Cahnges to skin/Changes to nails: no Any difficulty focusing or concentrating: no Sweating: no Dizziness/Lightheaded: no Palpitations: no  Carbonated beverages: no N/V/D/C/GAS: lose stools 2-3 times a day Abdominal Pain: no Dumping syndrome: no  Recent physical activity:  Walking 3 -4 days a week and rode her bicycle one day  Progress Towards Goal(s):  In progress.  Handouts given during visit include:  Non starchy veggies + protein    Nutritional Diagnosis:  Clint-3.3 Overweight/obesity related to past poor dietary habits and physical inactivity as evidenced by patient w/ recent RYGB surgery following dietary guidelines for continued weight loss.     Intervention:  Nutrition counseling. Pts diet was advanced to the next phase now including non starchy vegetables. Due to the bodies need for essential vitamins, minerals, and fats the pt was educated on the need to consume a certain amount of calories as well as certain nutrients daily. Pt was educated on the need for daily physical activity and to reach a goal of at least 150 minutes of moderate to vigorous physical activity as directed by their physician due to such benefits as increased musculature and improved lab values.    Teaching Method Utilized:  Visual Auditory Hands on  Barriers to learning/adherence to lifestyle change: emotional disconnection with food when stressed   Demonstrated degree of understanding via:  Teach Back   Monitoring/Evaluation:  Dietary intake,  exercise, and body weight.

## 2018-08-31 NOTE — Patient Instructions (Addendum)
-  Stop multitasking while eating; when it is time to eat ONLY eat  -Start making your meals a few days at a time on your days off  -Continue to aim for a minimum of 64 fluid ounces 7 days a week with at least 30 ounces being plain water  -Eat non-starchy vegetables 2 times a day 7 days a week  -Start out with soft cooked vegetables today and tomorrow; if tolerated begin to eat raw vegetables or cooked including salads  -Eat your 3 ounces of protein first then start in on your non-starchy vegetables; once you understand how much of your meal leads to satisfaction and not full while still eating 3 ounces of protein and non-starchy vegetables you can eat them in any order   -Continue to aim for 30 minutes of activity at least 5 times a week  -Stop drinking the protein shakes for 3 days if diarrhea stops you know it was the protein powder if it continues talk with your doctor about metformin  -Try the appropriate multivitamin in capsule form

## 2018-09-04 ENCOUNTER — Encounter: Payer: Self-pay | Admitting: Family Medicine

## 2018-09-07 MED FILL — ATORVASTATIN CALCIUM 20 MG: 20 | 90 days supply | Qty: 90 | Fill #1

## 2018-09-08 ENCOUNTER — Telehealth: Payer: Self-pay | Admitting: Skilled Nursing Facility1

## 2018-09-08 NOTE — Telephone Encounter (Signed)
Dietitian returned pts phone call.    Pt states she cannot take extended release metformin.   Pt states she will ask her doctor what to do about her diarrhea.

## 2018-09-18 ENCOUNTER — Ambulatory Visit (INDEPENDENT_AMBULATORY_CARE_PROVIDER_SITE_OTHER): Payer: 59 | Admitting: Specialist

## 2018-09-18 ENCOUNTER — Encounter (INDEPENDENT_AMBULATORY_CARE_PROVIDER_SITE_OTHER): Payer: Self-pay | Admitting: Specialist

## 2018-09-18 VITALS — BP 111/69 | HR 65 | Ht 60.0 in | Wt 197.0 lb

## 2018-09-18 DIAGNOSIS — M48062 Spinal stenosis, lumbar region with neurogenic claudication: Secondary | ICD-10-CM

## 2018-09-18 DIAGNOSIS — M170 Bilateral primary osteoarthritis of knee: Secondary | ICD-10-CM | POA: Diagnosis not present

## 2018-09-18 DIAGNOSIS — M4316 Spondylolisthesis, lumbar region: Secondary | ICD-10-CM

## 2018-09-18 MED ORDER — TRAMADOL HCL 50 MG PO TABS: 50.0000 mg | ORAL_TABLET | Freq: Four times a day (QID) | ORAL | 0 refills | Status: DC | PRN

## 2018-09-18 MED FILL — traMADol HCL 50 MG TABS: 50 | 13 days supply | Qty: 50 | Fill #0

## 2018-09-18 NOTE — Patient Instructions (Signed)
Avoid bending, stooping and avoid lifting weights greater than 10 lbs. Avoid prolong standing and walking. Avoid frequent bending and stooping  No lifting greater than 10 lbs. May use ice or moist heat for pain. Weight loss is of benefit. Handicap license is approved. Consider CBD oil capsules, [email or web address]  5,000 mg 120 cap  41.6 mg per capsule. 1-2  Per day.

## 2018-09-18 NOTE — Progress Notes (Signed)
Office Visit Note   Patient: Cristina [name]           Date of Birth: 15-Sep-1961           MRN: [id number] Visit Date: 09/18/2018              Requested by: [name], MD [place], [place] [address] PCP: [name], MD   Assessment & Plan: Visit Diagnoses:  1. Bilateral primary osteoarthritis of knee   2. Spondylolisthesis, lumbar region   3. Spinal stenosis of lumbar region with neurogenic claudication     Plan: Avoid bending, stooping and avoid lifting weights greater than 10 lbs. Avoid prolong standing and walking. Avoid frequent bending and stooping  No lifting greater than 10 lbs. May use ice or moist heat for pain. Weight loss is of benefit. Handicap license is approved. Consider CBD oil capsules, [employer]  5,000 mg 120 cap  41.6 mg per capsule. 1-2  Per day.   Follow-Up Instructions: No follow-ups on file.   Orders:  No orders of the defined types were placed in this encounter.  No orders of the defined types were placed in this encounter.     Procedures: No procedures performed   Clinical Data: No additional findings.   Subjective: Chief Complaint  Patient presents with  . Neck - Follow-up  . Lower Back - Follow-up  . Right Knee - Follow-up  . Left Knee - Follow-up    57 year old female with history of knee pain and back pain and pain with first standing and walking pain. No bowel or bladder difficulty. Underwent a gastic bypass surgery in August, with Dr. Excell Seltzer. Since then she has lost at least 40lbs and is off 3 of her antidiabetes medication. The incisions are healed, she had arthroscopic surgery, took about one hour. She is back to work 2 weeks post surgery.     Review of Systems   Objective: Vital Signs: BP 111/69 (BP Location: Left Arm, Patient Position: Sitting)   Pulse 65   Ht 5' (1.524 m)   Wt 197 lb (89.4 kg)   BMI 38.47 kg/m   Physical Exam  Constitutional: She is oriented to person,  place, and time. She appears well-developed and well-nourished.  HENT:  Head: Normocephalic and atraumatic.  Eyes: Pupils are equal, round, and reactive to light. EOM are normal.  Neck: Normal range of motion. Neck supple.  Pulmonary/Chest: Effort normal and breath sounds normal.  Abdominal: Soft. Bowel sounds are normal.  Neurological: She is alert and oriented to person, place, and time.  Skin: Skin is warm and dry.  Psychiatric: She has a normal mood and affect. Her behavior is normal. Judgment and thought content normal.    Back Exam   Tenderness  The patient is experiencing tenderness in the lumbar.  Range of Motion  Extension: abnormal  Flexion: normal  Lateral bend right: normal  Lateral bend left: normal  Rotation right: normal  Rotation left: normal   Muscle Strength  Right Quadriceps:  5/5  Left Quadriceps:  5/5  Right Hamstrings:  5/5  Left Hamstrings:  5/5   Tests  Straight leg raise right: negative Straight leg raise left: negative  Reflexes  Patellar: normal Achilles: normal Babinski's sign: normal   Other  Toe walk: normal Heel walk: normal Sensation: normal Gait: normal       Specialty Comments:  No specialty comments available.  Imaging: No results found.   PMFS History:  Patient Active Problem List   Diagnosis Date Noted  . Morbid obesity with body mass index (BMI) of 45.0 to 49.9 in adult [facility]) 07/06/2018  . Type 2 diabetes mellitus without complication, without long-term current use of insulin ([place]) 05/22/2017  . Class 3 obesity with serious comorbidity and body mass index (BMI) of 40.0 to 44.9 in adult 05/21/2017  . Family history of colon cancer 04/01/2017  . Irritable bowel syndrome with diarrhea 04/01/2017  . NSAID long-term use 04/01/2017  . Lymphocytosis 02/24/2017  . Essential hypertension 02/12/2017  . Mild intermittent asthma with acute exacerbation 12/12/2016  . Left shoulder pain 06/02/2015  . Insomnia 05/12/2014  .  Fibromyalgia 05/12/2014  . Allergic rhinitis 03/28/2014  . Osteoarthritis 12/20/2013  . Cervical strain 06/21/2013  . Low back pain 06/21/2013  . Metatarsalgia of both feet 03/11/2013  . Sciatica 01/25/2013  . Restless leg syndrome 11/24/2012  . Pure hypercholesterolemia 10/20/2012  . OSA on CPAP 10/20/2012  . Depression 10/20/2012  . Iron deficiency anemia, unspecified 10/01/2012  . Diabetes mellitus, type 2 ([place]) 12/31/2010  . ANAL FISSURE 12/31/2010  . Morbid obesity ([place]) 01/08/2007  . CONSTIPATION 01/08/2007   Past Medical History:  Diagnosis Date  . Allergic rhinitis, cause unspecified   . Anemia   . Arthritis   . Asthma   . Back pain   . Chest pain   . Constipation   . CTS (carpal tunnel syndrome)   . Cystocele   . Depression   . Diabetes mellitus   . Dysfunction of eustachian tube   . Fatty liver   . Fissure, anal   . Gallbladder problem   . Genital herpes   . GERD (gastroesophageal reflux disease)   . Hemorrhoid   . Hx of migraine headaches   . Hyperlipidemia   . Hypertension   . IBS (irritable bowel syndrome)   . Insomnia   . Iron deficiency anemia, unspecified   . Joint pain   . Nausea   . Obesity   . OSA on CPAP   . Osteoarthritis   . Rectocele   . Sleep apnea   . TMJ (dislocation of temporomandibular joint)     Family History  Problem Relation Age of Onset  . Hypertension Mother 51  . Osteoarthritis Mother   . Diabetes Mother   . Colon cancer Mother 58  . Cancer Mother 75       colon cancer  . Hyperlipidemia Mother   . Obesity Mother   . Sudden death Mother   . Arthritis Mother   . Colon cancer Paternal Grandmother   . Cancer Paternal Grandmother        colon cancer  . Stroke Maternal Grandmother   . Colon cancer Maternal Grandmother   . Colon polyps Brother        x 2 brother  . Diabetes Daughter   . Hypertension Daughter   . Sleep apnea Daughter   . Mental illness Daughter        depression  . Migraines Daughter   . Cirrhosis  Brother        alcoholism  . Alcohol abuse Brother   . Migraines Daughter   . Mental illness Daughter        anxiety attacks  . Protein S deficiency Daughter   . Sleep apnea Brother   . Hyperlipidemia Brother   . Migraines Brother   . Obstructive Sleep Apnea Brother   . Colon cancer Paternal Aunt     Past Surgical  History:  Procedure Laterality Date  . ABDOMINAL HYSTERECTOMY  11/11/2010   [place] Plan.  Ovaries intact.  Uterine fibroids with DUB.  [name] CARPAL TUNNEL RELEASE  1989   Left  . CHOLECYSTECTOMY    . CHOLECYSTECTOMY    . COLONOSCOPY  06/29/2012   normal.  Repeat in 5 years.  [name] [place]  . ESOPHAGOGASTRODUODENOSCOPY  12/13/2011   dysphagia.  Henrene Pastor.  Normal.  . 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 Sleep study  09/11/2012   severe OSA.  CPAP titration at 12 cm water pressure.  . TUBAL LIGATION  1988   Social History   Occupational History  . 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 Smoking status: Former Research scientist (life sciences)  . Smokeless tobacco: Never Used  . Tobacco comment: smoked occasionally longest 6 mos.  Substance and Sexual Activity  . Alcohol use: No    Alcohol/week: 1.0 standard drinks    Types: 1 Glasses of wine per week  . Drug use: No  . Sexual activity: Never    Birth control/protection: None

## 2018-09-30 ENCOUNTER — Other Ambulatory Visit: Payer: Self-pay

## 2018-09-30 ENCOUNTER — Other Ambulatory Visit: Payer: Self-pay | Admitting: Family Medicine

## 2018-09-30 ENCOUNTER — Other Ambulatory Visit: Payer: 59 | Admitting: *Deleted

## 2018-09-30 DIAGNOSIS — Z9884 Bariatric surgery status: Secondary | ICD-10-CM | POA: Diagnosis not present

## 2018-10-01 ENCOUNTER — Ambulatory Visit: Payer: Self-pay

## 2018-10-02 ENCOUNTER — Ambulatory Visit
Admission: RE | Admit: 2018-10-02 | Discharge: 2018-10-02 | Disposition: A | Discharge status: Home / Self Care | Payer: 59 | Source: Ambulatory Visit | Attending: Family Medicine | Admitting: Family Medicine

## 2018-10-02 DIAGNOSIS — Z1231 Encounter for screening mammogram for malignant neoplasm of breast: Secondary | ICD-10-CM

## 2018-10-05 MED FILL — JARDIANCE 25 MG TABLET: 25 | 90 days supply | Qty: 90 | Fill #2

## 2018-10-06 LAB — CBC WITH DIFFERENTIAL/PLATELET
BASOS ABS: 0.1 10*3/uL (ref 0.0–0.2)
Basos: 1 %
EOS (ABSOLUTE): 0.2 10*3/uL (ref 0.0–0.4)
Eos: 2 %
Hematocrit: 39.5 % (ref 34.0–46.6)
Hemoglobin: 12.8 g/dL (ref 11.1–15.9)
Immature Grans (Abs): 0 10*3/uL (ref 0.0–0.1)
Immature Granulocytes: 0 %
LYMPHS ABS: 4.7 10*3/uL — AB (ref 0.7–3.1)
Lymphs: 44 %
MCH: 26.8 pg (ref 26.6–33.0)
MCHC: 32.4 g/dL (ref 31.5–35.7)
MCV: 83 fL (ref 79–97)
MONOCYTES: 5 %
MONOS ABS: 0.6 10*3/uL (ref 0.1–0.9)
NEUTROS PCT: 48 %
Neutrophils Absolute: 5.2 10*3/uL (ref 1.4–7.0)
PLATELETS: 377 10*3/uL (ref 150–450)
RBC: 4.78 x10E6/uL (ref 3.77–5.28)
RDW: 15.2 % (ref 12.3–15.4)
WBC: 10.7 10*3/uL (ref 3.4–10.8)

## 2018-10-06 LAB — VITAMIN D 1,25 DIHYDROXY
Vitamin D 1, 25 (OH)2 Total: 44 pg/mL
Vitamin D2 1, 25 (OH)2: 11 pg/mL
Vitamin D3 1, 25 (OH)2: 33 pg/mL

## 2018-10-06 LAB — COMPREHENSIVE METABOLIC PANEL
ALBUMIN: 4.2 g/dL (ref 3.5–5.5)
ALT: 22 IU/L (ref 0–32)
AST: 18 IU/L (ref 0–40)
Albumin/Globulin Ratio: 1.5 (ref 1.2–2.2)
Alkaline Phosphatase: 118 IU/L — ABNORMAL HIGH (ref 39–117)
BUN/Creatinine Ratio: 12 (ref 9–23)
BUN: 10 mg/dL (ref 6–24)
Bilirubin Total: 0.5 mg/dL (ref 0.0–1.2)
CALCIUM: 9.6 mg/dL (ref 8.7–10.2)
CO2: 19 mmol/L — AB (ref 20–29)
CREATININE: 0.83 mg/dL (ref 0.57–1.00)
Chloride: 104 mmol/L (ref 96–106)
GFR calc Af Amer: 92 mL/min/{1.73_m2} (ref >59)
GFR, EST NON AFRICAN AMERICAN: 80 mL/min/{1.73_m2} (ref >59)
GLUCOSE: 129 mg/dL — AB (ref 65–99)
Globulin, Total: 2.8 g/dL (ref 1.5–4.5)
Potassium: 4 mmol/L (ref 3.5–5.2)
SODIUM: 140 mmol/L (ref 134–144)
Total Protein: 7 g/dL (ref 6.0–8.5)

## 2018-10-06 LAB — FOLATE: Folate: 16.3 ng/mL (ref >3.0)

## 2018-10-06 LAB — VITAMIN D 25 HYDROXY (VIT D DEFICIENCY, FRACTURES): Vit D, 25-Hydroxy: 29.7 ng/mL — ABNORMAL LOW (ref 30.0–100.0)

## 2018-10-06 LAB — VITAMIN B1: Thiamine: 125.5 nmol/L (ref 66.5–200.0)

## 2018-10-06 LAB — IRON: Iron: 47 ug/dL (ref 27–159)

## 2018-10-06 LAB — VITAMIN B12: VITAMIN B 12: 787 pg/mL (ref 232–1245)

## 2018-10-12 ENCOUNTER — Encounter: Payer: 59 | Attending: General Surgery | Admitting: Skilled Nursing Facility1

## 2018-10-12 ENCOUNTER — Encounter: Payer: Self-pay | Admitting: Skilled Nursing Facility1

## 2018-10-12 DIAGNOSIS — Z6841 Body Mass Index (BMI) 40.0 and over, adult: Secondary | ICD-10-CM | POA: Insufficient documentation

## 2018-10-12 DIAGNOSIS — E669 Obesity, unspecified: Secondary | ICD-10-CM

## 2018-10-12 DIAGNOSIS — Z713 Dietary counseling and surveillance: Secondary | ICD-10-CM | POA: Insufficient documentation

## 2018-10-12 NOTE — Progress Notes (Signed)
Follow-up visit: Post-Operative RYGB Surgery  Pt states since stopping metformin she no longer having diarrhea. Pt states she has been started on a vitamin D ans states she will start taking the appropriate multivitamin (pt states she cannot remember which one it is). Pt states her blood sugar has not been over 120. Pt states drug reps bing her breakfast and lunch often. Pt states since eating 3 meals  Day more often she has not felt as sluggish and hungry. Pt states sodas make her stomach burn. Pt states she is still under a tremendous amount of stress. Pt states she sleeps about 6-7 hours. Pt states she tries to stay away from the scale due to the disappointment.  Pt states she wants to add fruit: Dietitian educate dhte pt on this possibly slowing her weight loss; pt still wants to add fruit.   Surgery date: 07/06/2018 Surgery type: RYGB Start weight at [facility]: 235.6 Weight today: pt declined  Weight change:   TANITA  BODY COMP RESULTS  07/21/2018 08/31/2018   BMI (kg/m^2) 43.6 40.5   Fat Mass (lbs) 114.8 101.6   Fat Free Mass (lbs) 108.4 106   Total Body Water (lbs) 78.6 76.2   24-hr recall: 3 meals a day 6 days; vegetables 1-2 times a day 7 days a week  B (7-7:30 AM): protein powder in almond milk or scrambled eggs with 2 slices of bacon (not from home) or yogurt and cheese or yogurt and sausage  Snk (AM): cheese slices or tomato or celery with ranch  L (PM): pork and beans or pinto beans or hot dog with hot dog chili and mustard or hamburger meat and black beans and 8 tortilla chips with queso dip Snk (PM): [place] pepperoni and cheese D (PM): wendys chili or taco salad from taco bell without the shell Snk (PM): jello or carrots with ranch or cheese  Fluid intake: 3-4 bottles of water, sugar natures twist, gatorade zero, regular lemonade, diet mountain dew, almond milk:  Estimated total protein intake: 60+  Medications: stopped taking metformin and now taking jardiance   Supplementation: [employer] multivitamin and 2 calcium   CBG monitoring: couple times a week Average CBG per patient: 120 Last patient reported A1c:   Using straws: no Drinking while eating: no Having you been chewing well:no Chewing/swallowing difficulties: no Changes in vision: no Changes to mood/headaches: no Hair loss/Changes to skin/Changes to nails: no Any difficulty focusing or concentrating: no Sweating: no Dizziness/Lightheaded: no Palpitations: no  Carbonated beverages: no N/V/D/C/GAS: normal bowel movement every day Abdominal Pain: no Dumping syndrome: no  Recent physical activity:  ADL's  Progress Towards Goal(s):  In progress.  Handouts given during visit include:  Non starchy veggies + protein    Nutritional Diagnosis:  [place]-3.3 Overweight/obesity related to past poor dietary habits and physical inactivity as evidenced by patient w/ recent RYGB surgery following dietary guidelines for continued weight loss.  Intervention:  Nutrition counseling. Pts diet was advanced to the next phase now including non starchy vegetables. Due to the bodies need for essential vitamins, minerals, and fats the pt was educated on the need to consume a certain amount of calories as well as certain nutrients daily. Pt was educated on the need for daily physical activity and to reach a goal of at least 150 minutes of moderate to vigorous physical activity as directed by their physician due to such benefits as increased musculature and improved lab values.   Goals: Aim to get on your bike one day  a week 1/2 to 1 serving of fruit throughout the day for a snack or with a protein food  Teaching Method Utilized:  Visual Auditory Hands on  Barriers to learning/adherence to lifestyle change: emotional disconnection with food when stressed   Demonstrated degree of understanding via:  Teach Back   Monitoring/Evaluation:  Dietary intake, exercise, and body weight.

## 2018-10-23 DIAGNOSIS — E119 Type 2 diabetes mellitus without complications: Secondary | ICD-10-CM | POA: Diagnosis not present

## 2018-10-23 DIAGNOSIS — Z9884 Bariatric surgery status: Secondary | ICD-10-CM | POA: Diagnosis not present

## 2018-10-23 DIAGNOSIS — E669 Obesity, unspecified: Secondary | ICD-10-CM | POA: Diagnosis not present

## 2018-10-23 DIAGNOSIS — F419 Anxiety disorder, unspecified: Secondary | ICD-10-CM | POA: Diagnosis not present

## 2018-10-23 MED FILL — TRIAMCINOLONE 0.1% CREAM: 0.1 | 15 days supply | Qty: 30 | Fill #0

## 2018-11-25 MED FILL — PANTOPRAZOLE SOD DR 40 MG T: 40 | 90 days supply | Qty: 180 | Fill #1

## 2018-11-25 MED FILL — BUPROPION HCL SR 200 MG TAB: 200 | 90 days supply | Qty: 90 | Fill #2

## 2018-11-25 MED FILL — ACYCLOVIR 400 MG TABLET: 400 | 90 days supply | Qty: 180 | Fill #1

## 2018-11-25 MED FILL — rOPINIRole HCL 1 MG TABS: 1 | 90 days supply | Qty: 90 | Fill #1

## 2018-11-25 MED FILL — SERTRALINE HCL 100 MG TAB: 100 | 90 days supply | Qty: 135 | Fill #1

## 2018-12-08 DIAGNOSIS — E119 Type 2 diabetes mellitus without complications: Secondary | ICD-10-CM | POA: Diagnosis not present

## 2018-12-08 DIAGNOSIS — H0102A Squamous blepharitis right eye, upper and lower eyelids: Secondary | ICD-10-CM | POA: Diagnosis not present

## 2018-12-08 DIAGNOSIS — H0102B Squamous blepharitis left eye, upper and lower eyelids: Secondary | ICD-10-CM | POA: Diagnosis not present

## 2018-12-08 DIAGNOSIS — H538 Other visual disturbances: Secondary | ICD-10-CM | POA: Diagnosis not present

## 2018-12-14 ENCOUNTER — Ambulatory Visit: Payer: 59 | Admitting: Skilled Nursing Facility1

## 2018-12-15 ENCOUNTER — Encounter: Payer: 59 | Attending: General Surgery | Admitting: Skilled Nursing Facility1

## 2018-12-15 ENCOUNTER — Encounter: Payer: Self-pay | Admitting: Skilled Nursing Facility1

## 2018-12-15 DIAGNOSIS — Z713 Dietary counseling and surveillance: Secondary | ICD-10-CM | POA: Insufficient documentation

## 2018-12-15 DIAGNOSIS — E669 Obesity, unspecified: Secondary | ICD-10-CM

## 2018-12-15 DIAGNOSIS — Z6841 Body Mass Index (BMI) 40.0 and over, adult: Secondary | ICD-10-CM | POA: Insufficient documentation

## 2018-12-15 NOTE — Patient Instructions (Addendum)
-  Spend some days alone with your granddaughter  -Get back into your hobbies: scrap booking, crocheting, sewing, walking in the park, etc.  -Fix your medicine   -For fluid try an alarm or fun bottle   -Start water aerobics

## 2018-12-15 NOTE — Progress Notes (Signed)
Follow-up visit: Post-Operative RYGB Surgery  Pt states she has been very stressed with a recent move and struggling with sleep and going to school and feels behind in school. Pt states she had a A break down the other day (crying). Pt states it has been very stressful taking care of her daughter whom is obese and an amputee stating she relies on her 56 year old daughter which really upset the pt. Pt states she does not eat bread. Pt states she will start water aerobics when her house is done getting fixed. Pt states if her A1C comes down again her doctor may take her off her diabetes medication. Pt states her daughter just needs to care for herself so she can be less stressed. Pt states she feels very lonely.   Surgery date: 07/06/2018 Surgery type: RYGB Start weight at [facility]: 235.6 Weight today: 176.6  TANITA  BODY COMP RESULTS  07/21/2018 08/31/2018 12/15/2018   BMI (kg/m^2) 43.6 40.5 33.4   Fat Mass (lbs) 114.8 101.6 77.8   Fat Free Mass (lbs) 108.4 106 98.8   Total Body Water (lbs) 78.6 76.2 69.8   24-hr recall: 3 meals a day 6 days; vegetables 1-2 times a day 7 days a week  B (7-7:30 AM): protein powder in almond milk or scrambled eggs with 2 slices of bacon (not from home) or yogurt and cheese or yogurt and sausage  Snk (AM): cheese slices or tomato or celery/carrots with ranch  L (PM): chicken and salad  Snk (PM): [place] pepperoni and cheese D (PM): protein shake or meat and vegetable and apple Snk (PM): apple or peanut butter and crackers or popcorn  Fluid intake: 2-3 bottles of water, sugar natures twist, gatorade zero, regular lemonade, diet mountain dew, almond milk:  Estimated total protein intake: 60+  Medications: stopped taking metformin and now taking jardiance  Supplementation: [employer]care multi and 2 calcium   CBG monitoring: couple times a week Average CBG per patient: 120, 126 Last patient reported A1c: 6.7  Using straws: no Drinking while eating: no Having you  been chewing well:no Chewing/swallowing difficulties: no Changes in vision: no Changes to mood/headaches: no Hair loss/Changes to skin/Changes to nails: no Any difficulty focusing or concentrating: no Sweating: no Dizziness/Lightheaded: no Palpitations: no  Carbonated beverages: no N/V/D/C/GAS: normal bowel movement every day Abdominal Pain: no Dumping syndrome: no  Recent physical activity:  ADL's  Progress Towards Goal(s):  In progress.  Handouts given during visit include:  Non starchy veggies + protein    Nutritional Diagnosis:  [place]-3.3 Overweight/obesity related to past poor dietary habits and physical inactivity as evidenced by patient w/ recent RYGB surgery following dietary guidelines for continued weight loss.  Intervention:  Nutrition counseling. Pts diet was advanced to the next phase now including non starchy vegetables. Due to the bodies need for essential vitamins, minerals, and fats the pt was educated on the need to consume a certain amount of calories as well as certain nutrients daily. Pt was educated on the need for daily physical activity and to reach a goal of at least 150 minutes of moderate to vigorous physical activity as directed by their physician due to such benefits as increased musculature and improved lab values.   Goals: -Spend some days alone with your granddaughter -Get back into your hobbies: scrap booking, crocheting, sewing, walking in the park, etc. -Fix your medicine  -For fluid try an alarm or fun bottle   Teaching Method Utilized:  Visual Auditory Hands on  Barriers  to learning/adherence to lifestyle change: emotional disconnection with food when stressed   Demonstrated degree of understanding via:  Teach Back   Monitoring/Evaluation:  Dietary intake, exercise, and body weight.

## 2018-12-16 DIAGNOSIS — G4733 Obstructive sleep apnea (adult) (pediatric): Secondary | ICD-10-CM | POA: Diagnosis not present

## 2018-12-21 ENCOUNTER — Ambulatory Visit (INDEPENDENT_AMBULATORY_CARE_PROVIDER_SITE_OTHER): Payer: 59

## 2018-12-21 ENCOUNTER — Encounter (INDEPENDENT_AMBULATORY_CARE_PROVIDER_SITE_OTHER): Payer: Self-pay | Admitting: Specialist

## 2018-12-21 ENCOUNTER — Ambulatory Visit (INDEPENDENT_AMBULATORY_CARE_PROVIDER_SITE_OTHER): Payer: 59 | Admitting: Specialist

## 2018-12-21 VITALS — BP 112/66 | HR 80 | Ht 60.0 in | Wt 177.0 lb

## 2018-12-21 DIAGNOSIS — M4726 Other spondylosis with radiculopathy, lumbar region: Secondary | ICD-10-CM

## 2018-12-21 DIAGNOSIS — M79672 Pain in left foot: Secondary | ICD-10-CM

## 2018-12-21 DIAGNOSIS — M170 Bilateral primary osteoarthritis of knee: Secondary | ICD-10-CM

## 2018-12-21 NOTE — Progress Notes (Signed)
Office Visit Note   Patient: Cristina [name]           Date of Birth: 14-Aug-1961           MRN: [id number] Visit Date: 12/21/2018              Requested by: [name], MD [place], [place] [address] PCP: [name], MD   Assessment & Plan: Visit Diagnoses:  1. Bilateral primary osteoarthritis of knee   2. Left foot pain   3. Other spondylosis with radiculopathy, lumbar region     Plan: Regards to her lumbar spine if she continues to have worsening low back pain with lower extremity radiculopathy recommend repeating MRI to compare to study that was done several years ago.  She does not feel that she is at this point to get the study.  Regards to her end-stage DJD left knee she understands that definitive treatment at this point would be total knee replacement.  She has failed conservative treatment with multiple injections in the past.  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  Told her that she could likely return back to work on light duty 6 to 8 weeks postop.  She will let us know she is wanting to schedule surgery.  For left plantar fasciitis she was given home stretching program to do.  Follow-up in 6 months for recheck but will return sooner if needed.  Follow-Up Instructions: Return in about 6 months (around 06/21/2019).   Orders:  Orders Placed This Encounter  Procedures  . XR Foot Complete Left   No orders of the defined types were placed in this encounter.     Procedures: No procedures performed   Clinical Data: No additional findings.   Subjective: Chief Complaint  Patient presents with  . Left Knee - Pain, Follow-up  . Left Foot - Pain    Pain in the left heel    HPI 58-year-old female history of lumbar spondylosis, end-stage DJD left knee for recheck.  States that she continues to have back pain with right greater left lower extremity neuropathy.  Not really worse  since last office visit.  States that she is not wanting to have any surgical procedures for her back.  Continues to have ongoing pain left knee.  Has failed conservative treatment with multiple injections in the past.  Advised that it will ultimately come down to her needing total knee replacement but has been hoping to put this off for as long as possible.  New complaint today of left plantar heel pain.  This has been off and on for several weeks.  No injury.  Pain when she is ambulating. Review of Systems No current cardiac pulmonary GI GU issues  Objective: Vital Signs: BP 112/66 (BP Location: Left Arm, Patient Position: Sitting)   Pulse 80   Ht 5' (1.524 m)   Wt 177 lb (80.3 kg)   BMI 34.57 kg/m   Physical Exam HENT:     Head: Normocephalic and atraumatic.  Eyes:     Extraocular Movements: Extraocular movements intact.     Pupils: Pupils are equal, round, and reactive to light.  Pulmonary:     Effort: No respiratory distress.  Musculoskeletal:     Comments: Exam left ankle she has good range of motion.  Ligament stable.  She is mildly tender along the peroneal tendon lateral foot.  Mild to moderate  tender over the plantar fascial calcaneal insertion.  Skin:    General: Skin is warm and dry.  Neurological:     General: No focal deficit present.     Mental Status: She is alert and oriented to person, place, and time.  Psychiatric:        Mood and Affect: Mood normal.     Ortho Exam  Specialty Comments:  No specialty comments available.  Imaging: No results found.   PMFS History: Patient Active Problem List   Diagnosis Date Noted  . Morbid obesity with body mass index (BMI) of 45.0 to 49.9 in adult [facility]) 07/06/2018  . Type 2 diabetes mellitus without complication, without long-term current use of insulin ([place]) 05/22/2017  . Class 3 obesity with serious comorbidity and body mass index (BMI) of 40.0 to 44.9 in adult 05/21/2017  . Family history of colon cancer  04/01/2017  . Irritable bowel syndrome with diarrhea 04/01/2017  . NSAID long-term use 04/01/2017  . Lymphocytosis 02/24/2017  . Essential hypertension 02/12/2017  . Mild intermittent asthma with acute exacerbation 12/12/2016  . Left shoulder pain 06/02/2015  . Insomnia 05/12/2014  . Fibromyalgia 05/12/2014  . Allergic rhinitis 03/28/2014  . Osteoarthritis 12/20/2013  . Cervical strain 06/21/2013  . Low back pain 06/21/2013  . Metatarsalgia of both feet 03/11/2013  . Sciatica 01/25/2013  . Restless leg syndrome 11/24/2012  . Pure hypercholesterolemia 10/20/2012  . OSA on CPAP 10/20/2012  . Depression 10/20/2012  . Iron deficiency anemia, unspecified 10/01/2012  . Diabetes mellitus, type 2 ([place]) 12/31/2010  . ANAL FISSURE 12/31/2010  . Morbid obesity ([place]) 01/08/2007  . CONSTIPATION 01/08/2007   Past Medical History:  Diagnosis Date  . Allergic rhinitis, cause unspecified   . Anemia   . Arthritis   . Asthma   . Back pain   . Chest pain   . Constipation   . CTS (carpal tunnel syndrome)   . Cystocele   . Depression   . Diabetes mellitus   . Dysfunction of eustachian tube   . Fatty liver   . Fissure, anal   . Gallbladder problem   . Genital herpes   . GERD (gastroesophageal reflux disease)   . Hemorrhoid   . Hx of migraine headaches   . Hyperlipidemia   . Hypertension   . IBS (irritable bowel syndrome)   . Insomnia   . Iron deficiency anemia, unspecified   . Joint pain   . Nausea   . Obesity   . OSA on CPAP   . Osteoarthritis   . Rectocele   . Sleep apnea   . TMJ (dislocation of temporomandibular joint)     Family History  Problem Relation Age of Onset  . Hypertension Mother 47  . Osteoarthritis Mother   . Diabetes Mother   . Colon cancer Mother 80  . Cancer Mother 38       colon cancer  . Hyperlipidemia Mother   . Obesity Mother   . Sudden death Mother   . Arthritis Mother   . Colon cancer Paternal Grandmother   . Cancer Paternal Grandmother         colon cancer  . Stroke Maternal Grandmother   . Colon cancer Maternal Grandmother   . Colon polyps Brother        x 2 brother  . Diabetes Daughter   . Hypertension Daughter   . Sleep apnea Daughter   . Mental illness Daughter        depression  .  Migraines Daughter   . Cirrhosis Brother        alcoholism  . Alcohol abuse Brother   . Migraines Daughter   . Mental illness Daughter        anxiety attacks  . Protein S deficiency Daughter   . Sleep apnea Brother   . Hyperlipidemia Brother   . Migraines Brother   . Obstructive Sleep Apnea Brother   . Colon cancer Paternal Aunt     Past Surgical History:  Procedure Laterality Date  . ABDOMINAL HYSTERECTOMY  11/11/2010   [place] Plan.  Ovaries intact.  Uterine fibroids with DUB.  [name] CARPAL TUNNEL RELEASE  1989   Left  . CHOLECYSTECTOMY    . CHOLECYSTECTOMY    . COLONOSCOPY  06/29/2012   normal.  Repeat in 5 years.  [name] [place]  . ESOPHAGOGASTRODUODENOSCOPY  12/13/2011   dysphagia.  Henrene Pastor.  Normal.  . 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 Sleep study  09/11/2012   severe OSA.  CPAP titration at 12 cm water pressure.  . TUBAL LIGATION  1988   Social History   Occupational History  . 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 Smoking status: Former Research scientist (life sciences)  . Smokeless tobacco: Never Used  . Tobacco comment: smoked occasionally longest 6 mos.  Substance and Sexual Activity  . Alcohol use: No    Alcohol/week: 1.0 standard drinks    Types: 1 Glasses of wine per week  . Drug use: No  . Sexual activity: Never    Birth control/protection: None

## 2018-12-28 MED FILL — MONTELUKAST SOD 10 MG TAB: 10 | 90 days supply | Qty: 90 | Fill #0

## 2019-01-01 ENCOUNTER — Ambulatory Visit (INDEPENDENT_AMBULATORY_CARE_PROVIDER_SITE_OTHER): Payer: 59 | Admitting: Surgery

## 2019-01-01 ENCOUNTER — Ambulatory Visit (INDEPENDENT_AMBULATORY_CARE_PROVIDER_SITE_OTHER): Payer: Self-pay

## 2019-01-01 DIAGNOSIS — M25511 Pain in right shoulder: Secondary | ICD-10-CM | POA: Diagnosis not present

## 2019-01-01 DIAGNOSIS — M79601 Pain in right arm: Secondary | ICD-10-CM

## 2019-01-01 MED ORDER — TRAMADOL HCL 50 MG PO TABS: 50.0000 mg | ORAL_TABLET | Freq: Three times a day (TID) | ORAL | 0 refills | Status: DC | PRN

## 2019-01-05 ENCOUNTER — Telehealth (INDEPENDENT_AMBULATORY_CARE_PROVIDER_SITE_OTHER): Payer: Self-pay | Admitting: Specialist

## 2019-01-05 NOTE — Telephone Encounter (Signed)
Please advise and I will call her back. Thank you

## 2019-01-05 NOTE — Telephone Encounter (Signed)
Pt called in said she is still continuing to have pain in her upper right arm, and wanted to ask Jeneen Rinks to go ahead and put in a referral for an mri. [phone]

## 2019-01-06 ENCOUNTER — Ambulatory Visit (INDEPENDENT_AMBULATORY_CARE_PROVIDER_SITE_OTHER): Payer: 59 | Admitting: Surgery

## 2019-01-07 ENCOUNTER — Ambulatory Visit (INDEPENDENT_AMBULATORY_CARE_PROVIDER_SITE_OTHER): Payer: 59 | Admitting: Surgery

## 2019-01-07 DIAGNOSIS — M25511 Pain in right shoulder: Secondary | ICD-10-CM | POA: Diagnosis not present

## 2019-01-15 DIAGNOSIS — Z9884 Bariatric surgery status: Secondary | ICD-10-CM | POA: Diagnosis not present

## 2019-02-04 ENCOUNTER — Ambulatory Visit (INDEPENDENT_AMBULATORY_CARE_PROVIDER_SITE_OTHER): Payer: 59 | Admitting: Surgery

## 2019-02-05 DIAGNOSIS — Z9884 Bariatric surgery status: Secondary | ICD-10-CM | POA: Diagnosis not present

## 2019-02-05 DIAGNOSIS — F419 Anxiety disorder, unspecified: Secondary | ICD-10-CM | POA: Diagnosis not present

## 2019-02-05 DIAGNOSIS — E7800 Pure hypercholesterolemia, unspecified: Secondary | ICD-10-CM | POA: Diagnosis not present

## 2019-02-05 DIAGNOSIS — E119 Type 2 diabetes mellitus without complications: Secondary | ICD-10-CM | POA: Diagnosis not present

## 2019-02-11 NOTE — Progress Notes (Signed)
Patient returns for recheck of right shoulder and arm pain.  States that she is doing much better.  Some soreness with overhead activity and reaching behind her back.  Using tramadol as needed at night.  Exam Very pleasant female alert and oriented in no acute distress.  Right shoulder good range of motion.  Has some soreness with impingement testing.  Negative drop arm test.  Neurovascular intact.  Plan Patient will still avoid any heavy lifting or pushing or pulling.  Follow-up with me in 4 weeks for recheck.  A week before that appointment if she still continues have ongoing problems she will call and let me know and I will schedule MRI.

## 2019-02-15 ENCOUNTER — Encounter (INDEPENDENT_AMBULATORY_CARE_PROVIDER_SITE_OTHER): Payer: Self-pay | Admitting: Surgery

## 2019-02-15 NOTE — Progress Notes (Signed)
Office Visit Note   Patient: Cristina [name]           Date of Birth: Feb 26, 1961           MRN: [id number] Visit Date: 01/01/2019              Requested by: [name], MD [place], [place] [id number] PCP: [name], MD   Assessment & Plan: Visit Diagnoses:  1. Right arm pain   2. Acute pain of right shoulder     Plan: Recommend patient resting her shoulder times a few days to see how she does.  No heavy lifting, pushing, pulling.  Follow-up in 5 days for recheck with me.  Shoulder is not better may get MRI to rule out rotator cuff injury.  Follow-Up Instructions: Return in about 5 days (around 01/06/2019) for with james.   Orders:  Orders Placed This Encounter  Procedures  . XR Shoulder Right  . XR Cervical Spine 2 or 3 views   Meds ordered this encounter  Medications  . traMADol (ULTRAM) 50 MG tablet    Sig: Take 1 tablet (50 mg total) by mouth every 8 (eight) hours as needed.    Dispense:  40 tablet    Refill:  0      Procedures: No procedures performed   Clinical Data: No additional findings.   Subjective: Chief Complaint  Patient presents with  . Neck - Pain  . Right Shoulder - Pain    HPI 58 year old female comes in today with complaints of right shoulder/arm pain.  States that on Tuesday she was in a box of books when about 20 to 30 pounds from the floor she felt a sharp pain in her shoulder.  Since then she is been having pain with overhead activity and reaching on her back.  Has had some right-sided neck pain.  Numbness and tingling in the arm. Review of Systems No current cardiac pulmonary GI GU issues  Objective: Vital Signs: There were no vitals taken for this visit.  Physical Exam HENT:     Head: Normocephalic and atraumatic.  Eyes:     Extraocular Movements: Extraocular movements intact.     Pupils: Pupils are equal, round, and reactive to light.  Pulmonary:     Effort: Pulmonary effort is normal. No respiratory  distress.  Musculoskeletal:     Comments: Right shoulder has good passive range of motion but with pain.  Positive impingement test.  Pain and weakness with supraspinatus resistance.  Negative drop arm test.  Neurovascular intact.  Skin:    General: Skin is warm and dry.  Neurological:     General: No focal deficit present.     Mental Status: She is alert and oriented to person, place, and time.  Psychiatric:        Mood and Affect: Mood normal.     Ortho Exam  Specialty Comments:  No specialty comments available.  Imaging: No results found.   PMFS History: Patient Active Problem List   Diagnosis Date Noted  . Morbid obesity with body mass index (BMI) of 45.0 to 49.9 in adult [facility]) 07/06/2018  . Type 2 diabetes mellitus without complication, without long-term current use of insulin ([place]) 05/22/2017  . Class 3 obesity with serious comorbidity and body mass index (BMI) of 40.0 to 44.9 in adult 05/21/2017  . Family history of colon cancer 04/01/2017  . Irritable bowel syndrome with diarrhea 04/01/2017  . NSAID long-term use 04/01/2017  .  Lymphocytosis 02/24/2017  . Essential hypertension 02/12/2017  . Mild intermittent asthma with acute exacerbation 12/12/2016  . Left shoulder pain 06/02/2015  . Insomnia 05/12/2014  . Fibromyalgia 05/12/2014  . Allergic rhinitis 03/28/2014  . Osteoarthritis 12/20/2013  . Cervical strain 06/21/2013  . Low back pain 06/21/2013  . Metatarsalgia of both feet 03/11/2013  . Sciatica 01/25/2013  . Restless leg syndrome 11/24/2012  . Pure hypercholesterolemia 10/20/2012  . OSA on CPAP 10/20/2012  . Depression 10/20/2012  . Iron deficiency anemia, unspecified 10/01/2012  . Diabetes mellitus, type 2 ([place]) 12/31/2010  . ANAL FISSURE 12/31/2010  . Morbid obesity ([place]) 01/08/2007  . CONSTIPATION 01/08/2007   Past Medical History:  Diagnosis Date  . Allergic rhinitis, cause unspecified   . Anemia   . Arthritis   . Asthma   . Back pain   .  Chest pain   . Constipation   . CTS (carpal tunnel syndrome)   . Cystocele   . Depression   . Diabetes mellitus   . Dysfunction of eustachian tube   . Fatty liver   . Fissure, anal   . Gallbladder problem   . Genital herpes   . GERD (gastroesophageal reflux disease)   . Hemorrhoid   . Hx of migraine headaches   . Hyperlipidemia   . Hypertension   . IBS (irritable bowel syndrome)   . Insomnia   . Iron deficiency anemia, unspecified   . Joint pain   . Nausea   . Obesity   . OSA on CPAP   . Osteoarthritis   . Rectocele   . Sleep apnea   . TMJ (dislocation of temporomandibular joint)     Family History  Problem Relation Age of Onset  . Hypertension Mother 59  . Osteoarthritis Mother   . Diabetes Mother   . Colon cancer Mother 37  . Cancer Mother 15       colon cancer  . Hyperlipidemia Mother   . Obesity Mother   . Sudden death Mother   . Arthritis Mother   . Colon cancer Paternal Grandmother   . Cancer Paternal Grandmother        colon cancer  . Stroke Maternal Grandmother   . Colon cancer Maternal Grandmother   . Colon polyps Brother        x 2 brother  . Diabetes Daughter   . Hypertension Daughter   . Sleep apnea Daughter   . Mental illness Daughter        depression  . Migraines Daughter   . Cirrhosis Brother        alcoholism  . Alcohol abuse Brother   . Migraines Daughter   . Mental illness Daughter        anxiety attacks  . Protein S deficiency Daughter   . Sleep apnea Brother   . Hyperlipidemia Brother   . Migraines Brother   . Obstructive Sleep Apnea Brother   . Colon cancer Paternal Aunt     Past Surgical History:  Procedure Laterality Date  . ABDOMINAL HYSTERECTOMY  11/11/2010   Mar[place] Plan.  Ovaries intact.  Uterine fibroids with DUB.  [name] CARPAL TUNNEL RELEASE  1989   Left  . CHOLECYSTECTOMY    . CHOLECYSTECTOMY    . COLONOSCOPY  06/29/2012   normal.  Repeat in 5 years.  [name] [place]  . ESOPHAGOGASTRODUODENOSCOPY   12/13/2011   dysphagia.  Henrene Pastor.  Normal.  . GASTRIC ROUX-EN-Y N/A 07/06/2018   Procedure: LAPAROSCOPIC ROUX-EN-Y GASTRIC  BYPASS WITH UPPER ENDOSCOPY;  Surgeon: Excell Seltzer, MD;  Location: [facility];  Service: General;  Laterality: N/A;  . Sleep study  09/11/2012   severe OSA.  CPAP titration at 12 cm water pressure.  . TUBAL LIGATION  1988   Social History   Occupational History  . Occupation: Audiological scientist: [employer]    Comment: [employer]  Tobacco Use  . Smoking status: Former Research scientist (life sciences)  . Smokeless tobacco: Never Used  . Tobacco comment: smoked occasionally longest 6 mos.  Substance and Sexual Activity  . Alcohol use: No    Alcohol/week: 1.0 standard drinks    Types: 1 Glasses of wine per week  . Drug use: No  . Sexual activity: Never    Birth control/protection: None

## 2019-02-16 MED FILL — JARDIANCE 25 MG TABLET: 25 | 90 days supply | Qty: 90 | Fill #0

## 2019-02-17 ENCOUNTER — Encounter: Payer: 59 | Attending: General Surgery | Admitting: Skilled Nursing Facility1

## 2019-02-17 ENCOUNTER — Other Ambulatory Visit: Payer: Self-pay

## 2019-02-17 DIAGNOSIS — Z6841 Body Mass Index (BMI) 40.0 and over, adult: Secondary | ICD-10-CM | POA: Insufficient documentation

## 2019-02-17 DIAGNOSIS — Z713 Dietary counseling and surveillance: Secondary | ICD-10-CM | POA: Insufficient documentation

## 2019-02-17 DIAGNOSIS — E119 Type 2 diabetes mellitus without complications: Secondary | ICD-10-CM

## 2019-02-17 NOTE — Patient Instructions (Signed)
-  Every day your home get on your bike at 12pm for a minimum of 10 minutes

## 2019-02-17 NOTE — Progress Notes (Signed)
Follow-up visit: Post-Operative RYGB Surgery  Pt states she has been very stressed with a recent move and struggling with sleep and going to school and feels behind in school. Pt states she had a A break down the other day (crying). Pt states it has been very stressful taking care of her daughter whom is obese and an amputee stating she relies on her 56 year old daughter which really upset the pt. Pt states she does not eat bread. Pt states she will start water aerobics when her house is done getting fixed. Pt states if her A1C comes down again her doctor may take her off her diabetes medication. Pt states her daughter just needs to care for herself so she can be less stressed. Pt states she feels very lonely.   Pt states things ave gotten better in her life stating she just has a couple more days until the end of the semester. Pt states she has to take in her clothes because they have all gotten too big. Pt states she knows she needs to work out more but struggles with getting motivated to actually do it.   Surgery date: 07/06/2018 Surgery type: RYGB [facility] weight at [facility]: 235.6 Weight today: pt declined  TANITA  BODY COMP RESULTS  07/21/2018 08/31/2018 12/15/2018   BMI (kg/m^2) 43.6 40.5 33.4   Fat Mass (lbs) 114.8 101.6 77.8   Fat Free Mass (lbs) 108.4 106 98.8   Total Body Water (lbs) 78.6 76.2 69.8   24-hr recall: 3 meals a day 6 days; vegetables 1-2 times a day 7 days a week  B (7-7:30 AM): sausage with V8 juice  Snk (AM): cheese slices or tomato or celery/carrots with ranch  L (PM): chicken and salad or frozen meal and plum Snk (PM): [place] pepperoni and cheese or nuts or some spagetti D (PM): thin crust veggie pizza  Snk (PM): apple or peanut butter and crackers or popcorn  Fluid intake: 2-3 bottles of water, sugar natures twist, gatorade zero, splenda lemonade, diet mountain dew, almond milk: 50-60 Estimated total protein intake: 60+  Medications: stopped taking metformin and now  taking jardiance  Supplementation: procare multi and 2 calcium   CBG monitoring: couple times a week Average CBG per patient: 120, 126 Last patient reported A1c: 6.9  Using straws: no Drinking while eating: no Having you been chewing well:no Chewing/swallowing difficulties: no Changes in vision: no Changes to mood/headaches: no Hair loss/Changes to skin/Changes to nails: no Any difficulty focusing or concentrating: no Sweating: no Dizziness/Lightheaded: no Palpitations: no  Carbonated beverages: no N/V/D/C/GAS: normal bowel movement every day Abdominal Pain: no Dumping syndrome: no  Recent physical activity:  ADL's  Progress Towards Goal(s):  In progress.   Nutritional Diagnosis:  [place]-3.3 Overweight/obesity related to past poor dietary habits and physical inactivity as evidenced by patient w/ recent RYGB surgery following dietary guidelines for continued weight loss.  Intervention:  Nutrition counseling. Pts diet was advanced to the next phase now including non starchy vegetables. Due to the bodies need for essential vitamins, minerals, and fats the pt was educated on the need to consume a certain amount of calories as well as certain nutrients daily. Pt was educated on the need for daily physical activity and to reach a goal of at least 150 minutes of moderate to vigorous physical activity as directed by their physician due to such benefits as increased musculature and improved lab values.   Goals: -Spend some days alone with your granddaughter -Get back into your  hobbies: scrap booking, crocheting, sewing, walking in the park, etc. -Every day your home get on your bike at 12pm for a minimum of 10 minutes   Teaching Method Utilized:  Visual Auditory Hands on  Barriers to learning/adherence to lifestyle change: emotional disconnection with food when stressed   Demonstrated degree of understanding via:  Teach Back   Monitoring/Evaluation:  Dietary intake, exercise, and  body weight.

## 2019-03-29 MED FILL — GABAPENTIN 300 MG CAPSULE: 300 | 90 days supply | Qty: 180 | Fill #0

## 2019-05-04 DIAGNOSIS — Z9884 Bariatric surgery status: Secondary | ICD-10-CM | POA: Diagnosis not present

## 2019-05-04 DIAGNOSIS — R1012 Left upper quadrant pain: Secondary | ICD-10-CM | POA: Diagnosis not present

## 2019-05-25 ENCOUNTER — Other Ambulatory Visit (HOSPITAL_BASED_OUTPATIENT_CLINIC_OR_DEPARTMENT_OTHER): Payer: Self-pay

## 2019-05-25 DIAGNOSIS — R0683 Snoring: Secondary | ICD-10-CM

## 2019-05-25 DIAGNOSIS — G4709 Other insomnia: Secondary | ICD-10-CM

## 2019-05-25 DIAGNOSIS — G2581 Restless legs syndrome: Secondary | ICD-10-CM

## 2019-05-25 DIAGNOSIS — G4730 Sleep apnea, unspecified: Secondary | ICD-10-CM

## 2019-05-25 DIAGNOSIS — R5383 Other fatigue: Secondary | ICD-10-CM

## 2019-06-04 ENCOUNTER — Other Ambulatory Visit (HOSPITAL_COMMUNITY): Payer: 59

## 2019-06-07 ENCOUNTER — Encounter (HOSPITAL_BASED_OUTPATIENT_CLINIC_OR_DEPARTMENT_OTHER): Payer: 59

## 2019-06-07 DIAGNOSIS — B9689 Other specified bacterial agents as the cause of diseases classified elsewhere: Secondary | ICD-10-CM | POA: Diagnosis not present

## 2019-06-07 DIAGNOSIS — J019 Acute sinusitis, unspecified: Secondary | ICD-10-CM | POA: Diagnosis not present

## 2019-06-07 MED FILL — SERTRALINE HCL 100 MG TAB: 100 | 90 days supply | Qty: 135 | Fill #0

## 2019-06-07 MED FILL — JARDIANCE 25 MG TABLET: 25 | 90 days supply | Qty: 90 | Fill #0

## 2019-06-07 MED FILL — ACYCLOVIR 400 MG TABLET: 400 | 90 days supply | Qty: 180 | Fill #2

## 2019-06-07 MED FILL — buPROPion HCL 100 MG TABS: 100 | 90 days supply | Qty: 180 | Fill #0

## 2019-06-07 MED FILL — PANTOPRAZOLE SOD DR 40 MG T: 40 | 90 days supply | Qty: 90 | Fill #0

## 2019-06-07 MED FILL — rOPINIRole HCL 1 MG TABS: 1 | 90 days supply | Qty: 90 | Fill #0

## 2019-06-08 ENCOUNTER — Other Ambulatory Visit (HOSPITAL_COMMUNITY): Payer: 59

## 2019-06-08 MED FILL — DICLOFENAC SODIUM 1 % GEL: 1 | 25 days supply | Qty: 100 | Fill #0

## 2019-06-08 MED FILL — AMOX-CLAV 875-125 MG TABLET: 875-125 | 7 days supply | Qty: 14 | Fill #0

## 2019-06-09 MED FILL — ATORVASTATIN 20 MG TABLET: 20 | 90 days supply | Qty: 90 | Fill #0

## 2019-06-11 ENCOUNTER — Encounter (HOSPITAL_BASED_OUTPATIENT_CLINIC_OR_DEPARTMENT_OTHER): Payer: 59

## 2019-06-11 ENCOUNTER — Other Ambulatory Visit (HOSPITAL_COMMUNITY)
Admission: RE | Admit: 2019-06-11 | Discharge: 2019-06-11 | Disposition: A | Discharge status: Home / Self Care | Payer: 59 | Source: Ambulatory Visit | Attending: Internal Medicine | Admitting: Internal Medicine

## 2019-06-11 DIAGNOSIS — Z01812 Encounter for preprocedural laboratory examination: Secondary | ICD-10-CM | POA: Insufficient documentation

## 2019-06-11 DIAGNOSIS — Z20828 Contact with and (suspected) exposure to other viral communicable diseases: Secondary | ICD-10-CM | POA: Diagnosis not present

## 2019-06-11 LAB — SARS CORONAVIRUS 2 (TAT 6-24 HRS): SARS Coronavirus 2: NEGATIVE

## 2019-06-14 ENCOUNTER — Encounter (HOSPITAL_BASED_OUTPATIENT_CLINIC_OR_DEPARTMENT_OTHER): Payer: 59 | Admitting: Internal Medicine

## 2019-06-15 ENCOUNTER — Ambulatory Visit (HOSPITAL_BASED_OUTPATIENT_CLINIC_OR_DEPARTMENT_OTHER): Payer: 59 | Attending: Family Medicine | Admitting: Internal Medicine

## 2019-06-15 ENCOUNTER — Other Ambulatory Visit: Payer: Self-pay

## 2019-06-15 DIAGNOSIS — R0683 Snoring: Secondary | ICD-10-CM

## 2019-06-15 DIAGNOSIS — I10 Essential (primary) hypertension: Secondary | ICD-10-CM | POA: Diagnosis not present

## 2019-06-15 DIAGNOSIS — Z79899 Other long term (current) drug therapy: Secondary | ICD-10-CM | POA: Insufficient documentation

## 2019-06-15 DIAGNOSIS — G4733 Obstructive sleep apnea (adult) (pediatric): Secondary | ICD-10-CM | POA: Insufficient documentation

## 2019-06-15 DIAGNOSIS — G4730 Sleep apnea, unspecified: Secondary | ICD-10-CM

## 2019-06-15 DIAGNOSIS — G4709 Other insomnia: Secondary | ICD-10-CM | POA: Insufficient documentation

## 2019-06-15 DIAGNOSIS — G2581 Restless legs syndrome: Secondary | ICD-10-CM | POA: Diagnosis not present

## 2019-06-15 DIAGNOSIS — E119 Type 2 diabetes mellitus without complications: Secondary | ICD-10-CM | POA: Insufficient documentation

## 2019-06-15 DIAGNOSIS — R5383 Other fatigue: Secondary | ICD-10-CM

## 2019-06-16 MED FILL — TRIAMCINOLONE 0.1% CREAM: 0.1 | 15 days supply | Qty: 30 | Fill #1

## 2019-06-19 DIAGNOSIS — R0683 Snoring: Secondary | ICD-10-CM

## 2019-06-19 NOTE — Procedures (Signed)
Patient Name: Cristina [name], Cristina [name] Date: 06/15/2019 Gender: Female D.O.B: [birth year] Age (years): 63 Referring Provider: Reginia Forts MD Height (inches): 61 Interpreting Physician: Baird Lyons MD, ABSM Weight (lbs): 160 RPSGT: Carolin Coy BMI: 30 MRN: [id number] Neck Size: 14.00  CLINICAL INFORMATION Sleep Study Type: NPSG Indication for sleep study: Diabetes, Excessive Daytime Sleepiness, Fatigue, Hypertension, Snoring Epworth Sleepiness Score: 3  Most recent polysomnogram dated 09/24/2012 revealed an AHI of 45.5/h. Most recent titration study dated 09/24/2012 was optimal at 12cm H2O with an AHI of 1.6/h.  SLEEP STUDY TECHNIQUE As per the AASM Manual for the Scoring of Sleep and Associated Events v2.3 (April 2016) with a hypopnea requiring 4% desaturations.  The channels recorded and monitored were frontal, central and occipital EEG, electrooculogram (EOG), submentalis EMG (chin), nasal and oral airflow, thoracic and abdominal wall motion, anterior tibialis EMG, snore microphone, electrocardiogram, and pulse oximetry.  MEDICATIONS Medications self-administered by patient taken the night of the study : ZOLOFT, [place] The study was initiated at 10:46:36 PM and ended at 4:48:54 AM.  Sleep onset time was 5.8 minutes and the sleep efficiency was 89.0%%. The total sleep time was 322.5 minutes.  Stage REM latency was N/A minutes.  The patient spent 9.8%% of the night in stage N1 sleep, 90.1%% in stage N2 sleep, 0.2%% in stage N3 and 0% in REM.  Alpha intrusion was absent.  Supine sleep was 13.02%.  RESPIRATORY PARAMETERS The overall apnea/hypopnea index (AHI) was 5.2 per hour. There were 0 total apneas, including 0 obstructive, 0 central and 0 mixed apneas. There were 28 hypopneas and 130 RERAs.  The AHI during Stage REM sleep was N/A per hour.  AHI while supine was 32.9 per hour.  The mean oxygen saturation was 94.2%. The minimum SpO2 during  sleep was 89.0%.  soft snoring was noted during this study.  CARDIAC DATA The 2 lead EKG demonstrated sinus rhythm. The mean heart rate was 64.2 beats per minute. Other EKG findings include: None.  LEG MOVEMENT DATA The total PLMS were 0 with a resulting PLMS index of 0.0. Associated arousal with leg movement index was 2.8 .  IMPRESSIONS - Mild obstructive sleep apnea occurred during this study (AHI = 5.2/h). - Insuffcient early events to meet protocol requirementsfor split CPAP titration. - No significant central sleep apnea occurred during this study (CAI = 0.0/h). - The patient had minimal desaturation during the study (Min O2 = 89.0%). Mean sat 94.2%. - The patient snored with soft snoring volume. - No cardiac abnormalities were noted during this study. - Clinically significant periodic limb movements did not occur during sleep. No significant associated arousals.  DIAGNOSIS - Obstructive Sleep Apnea (327.23 [G47.33 ICD-10])  RECOMMENDATIONS - Treatment for very mild OSA is directed at symptoms. Conservative measuress may include observation, weight loss and sleep position off back. - Other options, including CPAP or a fitted oral appliance or chin strap, would be based on clinical judgment. - Be careful with alcohol, sedatives and other CNS depressants that may worsen sleep apnea and disrupt normal sleep architecture. - Sleep hygiene should be reviewed to assess factors that may improve sleep quality. - Weight management and regular exercise should be initiated or continued if appropriate.  [Electronically signed] 06/19/2019 11:13 AM  Baird Lyons MD, ABSM Diplomate, American Board of Sleep Medicine   NPI: [id number]                         Tarri Fuller  Sherlon Handing, Tax adviser of Sleep Medicine  ELECTRONICALLY SIGNED ON:  06/19/2019, 11:13 AM [place] PH: [phone]   FX: [phone] [place]

## 2019-06-21 ENCOUNTER — Encounter: Payer: Self-pay | Admitting: Specialist

## 2019-06-21 ENCOUNTER — Ambulatory Visit (INDEPENDENT_AMBULATORY_CARE_PROVIDER_SITE_OTHER): Payer: 59 | Admitting: Specialist

## 2019-06-21 VITALS — BP 101/69 | HR 64 | Ht 60.0 in | Wt 162.0 lb

## 2019-06-21 DIAGNOSIS — M48062 Spinal stenosis, lumbar region with neurogenic claudication: Secondary | ICD-10-CM

## 2019-06-21 DIAGNOSIS — M4316 Spondylolisthesis, lumbar region: Secondary | ICD-10-CM | POA: Diagnosis not present

## 2019-06-21 MED ORDER — GABAPENTIN 300 MG PO CAPS: 300.0000 mg | ORAL_CAPSULE | Freq: Three times a day (TID) | ORAL | 1 refills | Status: DC

## 2019-06-21 MED FILL — GABAPENTIN 300 MG CAPSULE: 300 | 90 days supply | Qty: 270 | Fill #0

## 2019-06-21 NOTE — Progress Notes (Signed)
Office Visit Note   Patient: Cristina [name]           Date of Birth: 09-05-1961           MRN: [id number] Visit Date: 06/21/2019              Requested by: [name], MD [place],  [place] [id number] PCP: [name], MD   Assessment & Plan: Visit Diagnoses:  1. Spinal stenosis of lumbar region with neurogenic claudication   2. Spondylolisthesis, lumbar region     Plan: Avoid prolong standing and walking. Avoid frequent bending and stooping  No lifting greater than 10 lbs. May use ice or moist heat for pain.t. Handicap license is approved. She can call and we will schedule surgical treatment of her lumbar spine, with decompression and fusion of the L4-5 level.  Follow-Up Instructions: Return in about 4 months (around 10/21/2019).   Orders:  No orders of the defined types were placed in this encounter.  Meds ordered this encounter  Medications  . gabapentin (NEURONTIN) 300 MG capsule    Sig: Take 1 capsule (300 mg total) by mouth 3 (three) times daily.    Dispense:  270 capsule    Refill:  1      Procedures: No procedures performed   Clinical Data: No additional findings.   Subjective: Chief Complaint  Patient presents with  . Lower Back - Follow-up  . Left Knee - Follow-up    58 year old female with history of lumbar spinal stenosis with spondylolisthesis L4-5. She underwent a gastric bypass surgery almost one year ago and now has lost 85 lbs. She did well and continues to work at [employer]. 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 Her pain isleft buttock and is worse with bending, stooping and walking and standing. She fell a while ago and still has difficulty straightening the left knee. I was nearly 2 year ago when thea inury happened. She can grocery shop some, she is tired when she is shopping. No bowel or bladder difficulty.    Review of Systems  Constitutional: Positive for activity change  and unexpected weight change.  HENT: Positive for tinnitus. Negative for congestion, dental problem, drooling, ear discharge, ear pain, facial swelling, hearing loss, mouth sores, nosebleeds, postnasal drip, rhinorrhea, sinus pressure, sinus pain, sneezing, sore throat, trouble swallowing and voice change.   Eyes: Negative for photophobia, pain, discharge, redness, itching and visual disturbance.  Respiratory: Negative for apnea, cough, choking, chest tightness, wheezing and stridor.   Cardiovascular: Negative for chest pain, palpitations and leg swelling.  Gastrointestinal: Negative.   Endocrine: Negative for cold intolerance, heat intolerance, polydipsia, polyphagia and polyuria.  Genitourinary: Negative.   Musculoskeletal: Positive for back pain, gait problem and joint swelling.  Skin: Negative.   Allergic/Immunologic: Negative for environmental allergies, food allergies and immunocompromised state.  Neurological: Positive for weakness and numbness. Negative for dizziness, facial asymmetry, light-headedness and headaches.  Hematological: Negative for adenopathy. Does not bruise/bleed easily.  Psychiatric/Behavioral: Negative for agitation, behavioral problems, confusion, decreased concentration, dysphoric mood, hallucinations, self-injury, sleep disturbance and suicidal ideas. The patient is not nervous/anxious and is not hyperactive.      Objective: Vital Signs: BP 101/69 (BP Location: Left Arm, Patient Position: Sitting)   Pulse 64   Wt 162 lb (73.5 kg)   BMI 30.61 kg/m   Physical Exam Constitutional:      Appearance: She is well-developed.  HENT:  Head: Normocephalic and atraumatic.  Eyes:     Pupils: Pupils are equal, round, and reactive to light.  Neck:     Musculoskeletal: Normal range of motion and neck supple.  Pulmonary:     Effort: Pulmonary effort is normal.     Breath sounds: Normal breath sounds.  Abdominal:     General: Bowel sounds are normal.      Palpations: Abdomen is soft.  Skin:    General: Skin is warm and dry.  Neurological:     Mental Status: She is alert and oriented to person, place, and time.  Psychiatric:        Behavior: Behavior normal.        Thought Content: Thought content normal.        Judgment: Judgment normal.     Back Exam   Tenderness  The patient is experiencing tenderness in the lumbar.  Range of Motion  Extension: abnormal  Flexion: abnormal  Lateral bend right: normal  Lateral bend left: normal  Rotation right: normal  Rotation left: normal   Muscle Strength  Right Quadriceps:  5/5  Left Quadriceps:  5/5  Right Hamstrings:  5/5  Left Hamstrings:  5/5   Tests  Straight leg raise right: negative Straight leg raise left: negative  Reflexes  Patellar: 2/4 Achilles: 2/4 Babinski's sign: normal   Other  Toe walk: normal Heel walk: normal Sensation: normal Gait: normal  Erythema: no back redness Scars: absent      Specialty Comments:  No specialty comments available.  Imaging: No results found.   PMFS History: Patient Active Problem List   Diagnosis Date Noted  . Morbid obesity with body mass index (BMI) of 45.0 to 49.9 in adult [facility]) 07/06/2018  . Type 2 diabetes mellitus without complication, without long-term current use of insulin ([place]) 05/22/2017  . Class 3 obesity with serious comorbidity and body mass index (BMI) of 40.0 to 44.9 in adult 05/21/2017  . Family history of colon cancer 04/01/2017  . Irritable bowel syndrome with diarrhea 04/01/2017  . NSAID long-term use 04/01/2017  . Lymphocytosis 02/24/2017  . Essential hypertension 02/12/2017  . Mild intermittent asthma with acute exacerbation 12/12/2016  . Left shoulder pain 06/02/2015  . Insomnia 05/12/2014  . Fibromyalgia 05/12/2014  . Allergic rhinitis 03/28/2014  . Osteoarthritis 12/20/2013  . Cervical strain 06/21/2013  . Low back pain 06/21/2013  . Metatarsalgia of both feet 03/11/2013  . Sciatica  01/25/2013  . Restless leg syndrome 11/24/2012  . Pure hypercholesterolemia 10/20/2012  . OSA on CPAP 10/20/2012  . Depression 10/20/2012  . Iron deficiency anemia, unspecified 10/01/2012  . Snoring 10/01/2012  . Diabetes mellitus, type 2 ([place]) 12/31/2010  . ANAL FISSURE 12/31/2010  . Morbid obesity ([place]) 01/08/2007  . CONSTIPATION 01/08/2007   Past Medical History:  Diagnosis Date  . Allergic rhinitis, cause unspecified   . Anemia   . Arthritis   . Asthma   . Back pain   . Chest pain   . Constipation   . CTS (carpal tunnel syndrome)   . Cystocele   . Depression   . Diabetes mellitus   . Dysfunction of eustachian tube   . Fatty liver   . Fissure, anal   . Gallbladder problem   . Genital herpes   . GERD (gastroesophageal reflux disease)   . Hemorrhoid   . Hx of migraine headaches   . Hyperlipidemia   . Hypertension   . IBS (irritable bowel syndrome)   . Insomnia   .  Iron deficiency anemia, unspecified   . Joint pain   . Nausea   . Obesity   . OSA on CPAP   . Osteoarthritis   . Rectocele   . Sleep apnea   . TMJ (dislocation of temporomandibular joint)     Family History  Problem Relation Age of Onset  . Hypertension Mother 32  . Osteoarthritis Mother   . Diabetes Mother   . Colon cancer Mother 69  . Cancer Mother 85       colon cancer  . Hyperlipidemia Mother   . Obesity Mother   . Sudden death Mother   . Arthritis Mother   . Colon cancer Paternal Grandmother   . Cancer Paternal Grandmother        colon cancer  . Stroke Maternal Grandmother   . Colon cancer Maternal Grandmother   . Colon polyps Brother        x 2 brother  . Diabetes Daughter   . Hypertension Daughter   . Sleep apnea Daughter   . Mental illness Daughter        depression  . Migraines Daughter   . Cirrhosis Brother        alcoholism  . Alcohol abuse Brother   . Migraines Daughter   . Mental illness Daughter        anxiety attacks  . Protein S deficiency Daughter   . Sleep  apnea Brother   . Hyperlipidemia Brother   . Migraines Brother   . Obstructive Sleep Apnea Brother   . Colon cancer Paternal Aunt     Past Surgical History:  Procedure Laterality Date  . ABDOMINAL HYSTERECTOMY  11/11/2010   Marvel Plan.  Ovaries intact.  Uterine fibroids with DUB.  [name] CARPAL TUNNEL RELEASE  1989   Left  . CHOLECYSTECTOMY    . CHOLECYSTECTOMY    . COLONOSCOPY  06/29/2012   normal.  Repeat in 5 years.  [name] [place]  . ESOPHAGOGASTRODUODENOSCOPY  12/13/2011   dysphagia.  Henrene Pastor.  Normal.  . 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 Sleep study  09/11/2012   severe OSA.  CPAP titration at 12 cm water pressure.  . TUBAL LIGATION  1988   Social History   Occupational History  . 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 Smoking status: Former Research scientist (life sciences)  . Smokeless tobacco: Never Used  . Tobacco comment: smoked occasionally longest 6 mos.  Substance and Sexual Activity  . Alcohol use: No    Alcohol/week: 1.0 standard drinks    Types: 1 Glasses of wine per week  . Drug use: No  . Sexual activity: Never    Birth control/protection: None

## 2019-06-21 NOTE — Patient Instructions (Signed)
Avoid prolong standing and walking. Avoid frequent bending and stooping  No lifting greater than 10 lbs. May use ice or moist heat for pain.t. Handicap license is approved. She can call and we will schedule surgical treatment of her lumbar spine, with decompression and fusion of the L4-5 level.

## 2019-07-21 DIAGNOSIS — Z0000 Encounter for general adult medical examination without abnormal findings: Secondary | ICD-10-CM | POA: Diagnosis not present

## 2019-07-21 DIAGNOSIS — Z9884 Bariatric surgery status: Secondary | ICD-10-CM | POA: Diagnosis not present

## 2019-07-21 DIAGNOSIS — D508 Other iron deficiency anemias: Secondary | ICD-10-CM | POA: Diagnosis not present

## 2019-07-21 DIAGNOSIS — Z23 Encounter for immunization: Secondary | ICD-10-CM | POA: Diagnosis not present

## 2019-07-21 DIAGNOSIS — E119 Type 2 diabetes mellitus without complications: Secondary | ICD-10-CM | POA: Diagnosis not present

## 2019-07-21 DIAGNOSIS — E7800 Pure hypercholesterolemia, unspecified: Secondary | ICD-10-CM | POA: Diagnosis not present

## 2019-07-21 MED FILL — DICLOFENAC SODIUM 1 % GEL: 1 | 25 days supply | Qty: 100 | Fill #0

## 2019-07-21 MED FILL — HYDROCORTISONE ACETATE 25 M: 25 | 12 days supply | Qty: 24 | Fill #0

## 2019-07-21 MED FILL — MONTELUKAST SOD 10 MG TAB: 10 | 90 days supply | Qty: 90 | Fill #0

## 2019-07-21 MED FILL — FLUTICASONE PROP 50 MCG SPR: 50 | 90 days supply | Qty: 48 | Fill #0

## 2019-07-21 MED FILL — AZELASTINE HCL 0.05% DROPS: 0.05 | 60 days supply | Qty: 6 | Fill #0

## 2019-08-02 MED FILL — MONTELUKAST SOD 10 MG TAB: 10 | 90 days supply | Qty: 90 | Fill #0

## 2019-08-09 MED FILL — TRIAMCINOLONE 0.1% CREAM: 0.1 | 15 days supply | Qty: 30 | Fill #2

## 2019-08-09 MED FILL — FLUTICASONE PROP 50 MCG SPR: 50 | 90 days supply | Qty: 48 | Fill #0

## 2019-10-14 ENCOUNTER — Other Ambulatory Visit: Payer: Self-pay

## 2019-10-14 DIAGNOSIS — Z20822 Contact with and (suspected) exposure to covid-19: Secondary | ICD-10-CM

## 2019-10-21 ENCOUNTER — Ambulatory Visit: Payer: 59 | Admitting: Specialist

## 2019-10-24 DIAGNOSIS — H16042 Marginal corneal ulcer, left eye: Secondary | ICD-10-CM | POA: Diagnosis not present

## 2019-10-25 ENCOUNTER — Ambulatory Visit: Payer: 59 | Admitting: Specialist

## 2019-10-26 ENCOUNTER — Ambulatory Visit: Payer: Self-pay

## 2019-10-26 ENCOUNTER — Encounter: Payer: Self-pay | Admitting: Specialist

## 2019-10-26 ENCOUNTER — Other Ambulatory Visit: Payer: Self-pay

## 2019-10-26 ENCOUNTER — Ambulatory Visit (INDEPENDENT_AMBULATORY_CARE_PROVIDER_SITE_OTHER): Payer: 59 | Admitting: Specialist

## 2019-10-26 VITALS — BP 109/75 | HR 69 | Ht 60.0 in | Wt 165.0 lb

## 2019-10-26 DIAGNOSIS — G8929 Other chronic pain: Secondary | ICD-10-CM | POA: Diagnosis not present

## 2019-10-26 DIAGNOSIS — M1712 Unilateral primary osteoarthritis, left knee: Secondary | ICD-10-CM

## 2019-10-26 DIAGNOSIS — M48062 Spinal stenosis, lumbar region with neurogenic claudication: Secondary | ICD-10-CM | POA: Diagnosis not present

## 2019-10-26 DIAGNOSIS — M25562 Pain in left knee: Secondary | ICD-10-CM

## 2019-10-26 NOTE — Progress Notes (Signed)
Office Visit Note   Patient: Cristina [name]           Date of Birth: Sep 17, 1961           MRN: [id number] Visit Date: 10/26/2019              Requested by: [name], MD [place],  [place] [address] PCP: [name], MD   Assessment & Plan: Visit Diagnoses:  1. Chronic pain of left knee   2. Spinal stenosis of lumbar region with neurogenic claudication   3. Unilateral primary osteoarthritis, left knee     Plan: Left knee x-rays reviewed with patient today.  She understands that ultimately will come down her needing definitive treatment with left total knee replacement.  Patient also has fairly significant changes on the right knee and she would need total knee replacement there as well.  Left knee is worse.  Recommend that she follow-up in 3 months for recheck and we will likely get repeat x-rays of both knees at that primarily to make sure that she is not having progressive changes of her patellofemoral wear that will cause [name] to force her hand  into having replacement done sooner than later.  Return sooner if needed.  Follow-Up Instructions: Return in about 6 months (around 04/25/2020).   Orders:  Orders Placed This Encounter  Procedures  . XR KNEE 3 VIEW LEFT   No orders of the defined types were placed in this encounter.     Procedures: No procedures performed   Clinical Data: No additional findings.   Subjective: Chief Complaint  Patient presents with  . Lower Back - Follow-up    HPI 58 year old black female history of left greater than right knee pain returns.  Patient has not history of end-stage DJD bilateral knees.  Left knee worse than right.  States that she is not able to fully extend the left knee.  Pain and swelling in both.  She wears a brace on the left.  States that Dr. Louanne Skye has already advised her that ultimately will come down her needing total knee replacements.  She is hoping to be able to put this off for a while.  Patient  also has known history of lumbar stenosis and surgical intervention there has been discussed as well.  States that her back is doing reasonably well at this time. Review of Systems No current cardiac pulmonary GI GU issues  Objective: Vital Signs: BP 109/75 (BP Location: Left Arm, Patient Position: Sitting)   Pulse 69   Ht 5' (1.524 m)   Wt 165 lb (74.8 kg)   BMI 32.22 kg/m   Physical Exam Constitutional:      General: She is not in acute distress. HENT:     Head: Normocephalic and atraumatic.  Eyes:     Extraocular Movements: Extraocular movements intact.     Pupils: Pupils are equal, round, and reactive to light.  Pulmonary:     Effort: No respiratory distress.  Musculoskeletal:     Comments: Gait is somewhat antalgic.  Left knee patient lacks about 3 to 5 degrees full extension.  positive crepitus.  Joint line tender.  Calf nontender.  Neurovascularly intact.  Neurological:     General: No focal deficit present.     Mental Status: She is alert and oriented to person, place, and time.  Psychiatric:        Mood and Affect: Mood normal.     Ortho Exam  Specialty Comments:  No specialty comments available.  Imaging: No results found.   PMFS History: Patient Active Problem List   Diagnosis Date Noted  . Morbid obesity with body mass index (BMI) of 45.0 to 49.9 in adult [facility]) 07/06/2018  . Type 2 diabetes mellitus without complication, without long-term current use of insulin ([place]) 05/22/2017  . Class 3 obesity with serious comorbidity and body mass index (BMI) of 40.0 to 44.9 in adult 05/21/2017  . Family history of colon cancer 04/01/2017  . Irritable bowel syndrome with diarrhea 04/01/2017  . NSAID long-term use 04/01/2017  . Lymphocytosis 02/24/2017  . Essential hypertension 02/12/2017  . Mild intermittent asthma with acute exacerbation 12/12/2016  . Left shoulder pain 06/02/2015  . Insomnia 05/12/2014  . Fibromyalgia 05/12/2014  . Allergic rhinitis 03/28/2014   . Osteoarthritis 12/20/2013  . Cervical strain 06/21/2013  . Low back pain 06/21/2013  . Metatarsalgia of both feet 03/11/2013  . Sciatica 01/25/2013  . Restless leg syndrome 11/24/2012  . Pure hypercholesterolemia 10/20/2012  . OSA on CPAP 10/20/2012  . Depression 10/20/2012  . Iron deficiency anemia, unspecified 10/01/2012  . Snoring 10/01/2012  . Diabetes mellitus, type 2 ([place]) 12/31/2010  . ANAL FISSURE 12/31/2010  . Morbid obesity ([place]) 01/08/2007  . CONSTIPATION 01/08/2007   Past Medical History:  Diagnosis Date  . Allergic rhinitis, cause unspecified   . Anemia   . Arthritis   . Asthma   . Back pain   . Chest pain   . Constipation   . CTS (carpal tunnel syndrome)   . Cystocele   . Depression   . Diabetes mellitus   . Dysfunction of eustachian tube   . Fatty liver   . Fissure, anal   . Gallbladder problem   . Genital herpes   . GERD (gastroesophageal reflux disease)   . Hemorrhoid   . Hx of migraine headaches   . Hyperlipidemia   . Hypertension   . IBS (irritable bowel syndrome)   . Insomnia   . Iron deficiency anemia, unspecified   . Joint pain   . Nausea   . Obesity   . OSA on CPAP   . Osteoarthritis   . Rectocele   . Sleep apnea   . TMJ (dislocation of temporomandibular joint)     Family History  Problem Relation Age of Onset  . Hypertension Mother 4  . Osteoarthritis Mother   . Diabetes Mother   . Colon cancer Mother 19  . Cancer Mother 28       colon cancer  . Hyperlipidemia Mother   . Obesity Mother   . Sudden death Mother   . Arthritis Mother   . Colon cancer Paternal Grandmother   . Cancer Paternal Grandmother        colon cancer  . Stroke Maternal Grandmother   . Colon cancer Maternal Grandmother   . Colon polyps Brother        x 2 brother  . Diabetes Daughter   . Hypertension Daughter   . Sleep apnea Daughter   . Mental illness Daughter        depression  . Migraines Daughter   . Cirrhosis Brother        alcoholism  .  Alcohol abuse Brother   . Migraines Daughter   . Mental illness Daughter        anxiety attacks  . Protein S deficiency Daughter   . Sleep apnea Brother   . Hyperlipidemia Brother   . Migraines Brother   .  Obstructive Sleep Apnea Brother   . Colon cancer Paternal Aunt     Past Surgical History:  Procedure Laterality Date  . ABDOMINAL HYSTERECTOMY  11/11/2010   [place] Plan.  Ovaries intact.  Uterine fibroids with DUB.  [name] CARPAL TUNNEL RELEASE  1989   Left  . CHOLECYSTECTOMY    . CHOLECYSTECTOMY    . COLONOSCOPY  06/29/2012   normal.  Repeat in 5 years.  [name] [place]  . ESOPHAGOGASTRODUODENOSCOPY  12/13/2011   dysphagia.  Henrene Pastor.  Normal.  . 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 Sleep study  09/11/2012   severe OSA.  CPAP titration at 12 cm water pressure.  . TUBAL LIGATION  1988   Social History   Occupational History  . Occupation: Audiological scientist: [employer]    Comment: [employer]  Tobacco Use  . Smoking status: Former Research scientist (life sciences)  . Smokeless tobacco: Never Used  . Tobacco comment: smoked occasionally longest 6 mos.  Substance and Sexual Activity  . Alcohol use: No    Alcohol/week: 1.0 standard drinks    Types: 1 Glasses of wine per week  . Drug use: No  . Sexual activity: Never    Birth control/protection: None

## 2019-10-27 DIAGNOSIS — H16002 Unspecified corneal ulcer, left eye: Secondary | ICD-10-CM | POA: Diagnosis not present

## 2019-11-01 ENCOUNTER — Other Ambulatory Visit: Payer: Self-pay | Admitting: Cardiothoracic Surgery

## 2019-11-01 ENCOUNTER — Other Ambulatory Visit: Payer: Self-pay | Admitting: Family Medicine

## 2019-11-01 DIAGNOSIS — H538 Other visual disturbances: Secondary | ICD-10-CM | POA: Diagnosis not present

## 2019-11-01 DIAGNOSIS — H0102B Squamous blepharitis left eye, upper and lower eyelids: Secondary | ICD-10-CM | POA: Diagnosis not present

## 2019-11-01 DIAGNOSIS — Z1231 Encounter for screening mammogram for malignant neoplasm of breast: Secondary | ICD-10-CM

## 2019-11-01 DIAGNOSIS — H0102A Squamous blepharitis right eye, upper and lower eyelids: Secondary | ICD-10-CM | POA: Diagnosis not present

## 2019-11-01 DIAGNOSIS — E119 Type 2 diabetes mellitus without complications: Secondary | ICD-10-CM | POA: Diagnosis not present

## 2019-11-01 DIAGNOSIS — H16002 Unspecified corneal ulcer, left eye: Secondary | ICD-10-CM | POA: Diagnosis not present

## 2019-11-08 MED FILL — PANTOPRAZOLE SOD DR 40 MG T: 40 | 90 days supply | Qty: 90 | Fill #0

## 2019-11-08 MED FILL — ACYCLOVIR 400 MG TABLET: 400 | 90 days supply | Qty: 180 | Fill #0

## 2019-11-08 MED FILL — FLUTICASONE PROP 50 MCG SPR: 50 | 90 days supply | Qty: 48 | Fill #1

## 2019-11-08 MED FILL — JARDIANCE 25 MG TABLET: 25 | 90 days supply | Qty: 90 | Fill #0

## 2019-11-08 MED FILL — GABAPENTIN 300 MG CAPSULE: 300 | 90 days supply | Qty: 180 | Fill #0

## 2019-11-08 MED FILL — MONTELUKAST SOD 10 MG TAB: 10 | 90 days supply | Qty: 90 | Fill #1

## 2019-11-08 MED FILL — ATORVASTATIN 20 MG TABLET: 20 | 90 days supply | Qty: 90 | Fill #0

## 2019-11-22 ENCOUNTER — Ambulatory Visit: Payer: 59 | Admitting: Specialist

## 2019-12-14 ENCOUNTER — Ambulatory Visit
Admission: RE | Admit: 2019-12-14 | Discharge: 2019-12-14 | Disposition: A | Discharge status: Home / Self Care | Payer: 59 | Source: Ambulatory Visit | Attending: Family Medicine | Admitting: Family Medicine

## 2019-12-14 ENCOUNTER — Other Ambulatory Visit: Payer: Self-pay

## 2019-12-14 DIAGNOSIS — Z1231 Encounter for screening mammogram for malignant neoplasm of breast: Secondary | ICD-10-CM

## 2019-12-15 DIAGNOSIS — E119 Type 2 diabetes mellitus without complications: Secondary | ICD-10-CM | POA: Diagnosis not present

## 2019-12-15 DIAGNOSIS — H0102B Squamous blepharitis left eye, upper and lower eyelids: Secondary | ICD-10-CM | POA: Diagnosis not present

## 2019-12-15 DIAGNOSIS — H0102A Squamous blepharitis right eye, upper and lower eyelids: Secondary | ICD-10-CM | POA: Diagnosis not present

## 2019-12-15 DIAGNOSIS — H538 Other visual disturbances: Secondary | ICD-10-CM | POA: Diagnosis not present

## 2019-12-20 MED FILL — FREESTYLE LANCETS: 90 days supply | Qty: 100 | Fill #0

## 2019-12-20 MED FILL — FREESTYLE LITE TEST STRIP: 90 days supply | Qty: 100 | Fill #0

## 2020-01-19 DIAGNOSIS — M25762 Osteophyte, left knee: Secondary | ICD-10-CM | POA: Diagnosis not present

## 2020-01-19 DIAGNOSIS — M25561 Pain in right knee: Secondary | ICD-10-CM | POA: Diagnosis not present

## 2020-01-19 DIAGNOSIS — F419 Anxiety disorder, unspecified: Secondary | ICD-10-CM | POA: Diagnosis not present

## 2020-01-19 DIAGNOSIS — M170 Bilateral primary osteoarthritis of knee: Secondary | ICD-10-CM | POA: Diagnosis not present

## 2020-01-19 DIAGNOSIS — E119 Type 2 diabetes mellitus without complications: Secondary | ICD-10-CM | POA: Diagnosis not present

## 2020-01-19 DIAGNOSIS — L659 Nonscarring hair loss, unspecified: Secondary | ICD-10-CM | POA: Diagnosis not present

## 2020-01-19 DIAGNOSIS — E7800 Pure hypercholesterolemia, unspecified: Secondary | ICD-10-CM | POA: Diagnosis not present

## 2020-01-19 DIAGNOSIS — M25562 Pain in left knee: Secondary | ICD-10-CM | POA: Diagnosis not present

## 2020-01-19 MED FILL — TERCONAZOLE 0.8% VAGINAL CR: 0.8 | 3 days supply | Qty: 20 | Fill #0

## 2020-01-19 MED FILL — NITROGLYCERIN 0.4 MG TAB SL: 0.4 | 5 days supply | Qty: 25 | Fill #0

## 2020-01-20 ENCOUNTER — Ambulatory Visit: Payer: 59 | Admitting: Nurse Practitioner

## 2020-01-21 ENCOUNTER — Ambulatory Visit: Payer: 59 | Admitting: Nurse Practitioner

## 2020-01-24 ENCOUNTER — Ambulatory Visit: Payer: 59 | Admitting: Specialist

## 2020-01-25 MED FILL — buPROPion HCL 100 MG TABS: 100 | 90 days supply | Qty: 180 | Fill #0

## 2020-01-26 DIAGNOSIS — M25562 Pain in left knee: Secondary | ICD-10-CM | POA: Diagnosis not present

## 2020-01-26 DIAGNOSIS — M1712 Unilateral primary osteoarthritis, left knee: Secondary | ICD-10-CM | POA: Diagnosis not present

## 2020-01-26 DIAGNOSIS — M25762 Osteophyte, left knee: Secondary | ICD-10-CM | POA: Diagnosis not present

## 2020-01-27 DIAGNOSIS — M25561 Pain in right knee: Secondary | ICD-10-CM | POA: Diagnosis not present

## 2020-01-27 DIAGNOSIS — M25761 Osteophyte, right knee: Secondary | ICD-10-CM | POA: Diagnosis not present

## 2020-01-27 DIAGNOSIS — M1711 Unilateral primary osteoarthritis, right knee: Secondary | ICD-10-CM | POA: Diagnosis not present

## 2020-01-27 DIAGNOSIS — M222X1 Patellofemoral disorders, right knee: Secondary | ICD-10-CM | POA: Diagnosis not present

## 2020-01-27 DIAGNOSIS — R2689 Other abnormalities of gait and mobility: Secondary | ICD-10-CM | POA: Diagnosis not present

## 2020-02-02 DIAGNOSIS — M25762 Osteophyte, left knee: Secondary | ICD-10-CM | POA: Diagnosis not present

## 2020-02-02 DIAGNOSIS — M1712 Unilateral primary osteoarthritis, left knee: Secondary | ICD-10-CM | POA: Diagnosis not present

## 2020-02-02 DIAGNOSIS — M25562 Pain in left knee: Secondary | ICD-10-CM | POA: Diagnosis not present

## 2020-02-03 DIAGNOSIS — M1711 Unilateral primary osteoarthritis, right knee: Secondary | ICD-10-CM | POA: Diagnosis not present

## 2020-02-03 DIAGNOSIS — R2689 Other abnormalities of gait and mobility: Secondary | ICD-10-CM | POA: Diagnosis not present

## 2020-02-03 DIAGNOSIS — M25761 Osteophyte, right knee: Secondary | ICD-10-CM | POA: Diagnosis not present

## 2020-02-03 DIAGNOSIS — M25561 Pain in right knee: Secondary | ICD-10-CM | POA: Diagnosis not present

## 2020-02-03 DIAGNOSIS — M222X1 Patellofemoral disorders, right knee: Secondary | ICD-10-CM | POA: Diagnosis not present

## 2020-02-09 DIAGNOSIS — M25562 Pain in left knee: Secondary | ICD-10-CM | POA: Diagnosis not present

## 2020-02-09 DIAGNOSIS — M25762 Osteophyte, left knee: Secondary | ICD-10-CM | POA: Diagnosis not present

## 2020-02-09 DIAGNOSIS — M1712 Unilateral primary osteoarthritis, left knee: Secondary | ICD-10-CM | POA: Diagnosis not present

## 2020-02-10 ENCOUNTER — Other Ambulatory Visit: Payer: Self-pay

## 2020-02-10 ENCOUNTER — Ambulatory Visit (INDEPENDENT_AMBULATORY_CARE_PROVIDER_SITE_OTHER): Payer: 59 | Admitting: Specialist

## 2020-02-10 ENCOUNTER — Encounter: Payer: Self-pay | Admitting: Specialist

## 2020-02-10 VITALS — BP 105/61 | HR 61 | Ht 61.0 in | Wt 170.0 lb

## 2020-02-10 DIAGNOSIS — M1712 Unilateral primary osteoarthritis, left knee: Secondary | ICD-10-CM

## 2020-02-10 DIAGNOSIS — M25761 Osteophyte, right knee: Secondary | ICD-10-CM | POA: Diagnosis not present

## 2020-02-10 DIAGNOSIS — R2689 Other abnormalities of gait and mobility: Secondary | ICD-10-CM | POA: Diagnosis not present

## 2020-02-10 DIAGNOSIS — M4316 Spondylolisthesis, lumbar region: Secondary | ICD-10-CM | POA: Diagnosis not present

## 2020-02-10 DIAGNOSIS — M222X1 Patellofemoral disorders, right knee: Secondary | ICD-10-CM | POA: Diagnosis not present

## 2020-02-10 DIAGNOSIS — M48062 Spinal stenosis, lumbar region with neurogenic claudication: Secondary | ICD-10-CM | POA: Diagnosis not present

## 2020-02-10 DIAGNOSIS — M1711 Unilateral primary osteoarthritis, right knee: Secondary | ICD-10-CM | POA: Diagnosis not present

## 2020-02-10 DIAGNOSIS — M25561 Pain in right knee: Secondary | ICD-10-CM | POA: Diagnosis not present

## 2020-02-10 MED ORDER — TRAMADOL HCL 50 MG PO TABS: 100.0000 mg | ORAL_TABLET | Freq: Four times a day (QID) | ORAL | 0 refills | Status: AC | PRN

## 2020-02-10 NOTE — Patient Instructions (Signed)
  Plan: Knee is suffering from osteoarthritis, only real proven treatments are Weight loss, NSIADs like diclofenac and exercise. Well padded shoes help. Ice the knee that is suffering from osteoarthritis, only real proven treatments are Weight loss, NSIADs like diclofenac and exercise. Well padded shoes help. Ice the knee 2-3 times a day 15-20 mins at a time.-3 times a day 15-20 mins at a time. Hot showers in the AM.  Injection with steroid may be of benefit. Hemp CBD capsules, amazon.com 5,000-7,000 mg per bottle, 60 capsules per bottle, take one capsule twice a day. Cane in the left hand to use with left leg weight bearing. Follow-Up Instructions: No follow-ups on file.

## 2020-02-10 NOTE — Progress Notes (Signed)
Office Visit Note   Patient: Cristina [name]           Date of Birth: September 26, 1961           MRN: [id number] Visit Date: 02/10/2020              Requested by: [name], MD [place],  [place] [id number] PCP: [name], MD   Assessment & Plan: Visit Diagnoses: No diagnosis found.  Plan:Plan: Knee is suffering from osteoarthritis, only real proven treatments are Weight loss, NSIADs like diclofenac and exercise. Well padded shoes help. Ice the knee that is suffering from osteoarthritis, only real proven treatments are Weight loss, NSIADs like diclofenac and exercise. Well padded shoes help. Ice the knee 2-3 times a day 15-20 mins at a time.-3 times a day 15-20 mins at a time. Hot showers in the AM.  Injection with steroid may be of benefit. Hemp CBD capsules, amazon.com 5,000-7,000 mg per bottle, 60 capsules per bottle, take one capsule twice a day. Cane in the left hand to use with left leg weight bearing. Follow-Up Instructions: No follow-ups on file.   Follow-Up Instructions: Return in about 3 months (around 05/11/2020).   Orders:  No orders of the defined types were placed in this encounter.  No orders of the defined types were placed in this encounter.     Procedures: No procedures performed   Clinical Data: No additional findings.   Subjective: Chief Complaint  Patient presents with  . Lower Back - Pain  . Right Knee - Follow-up  . Left Knee - Follow-up    The left knee is painful and she is considering genvisc injection. I review the academy of orthopaedic surgeon recommendation. Her spine is painful intermittantly. She had bypass and has lost 85 lbs.    Review of Systems  Constitutional: Negative.   HENT: Negative.   Eyes: Negative.   Respiratory: Negative.   Cardiovascular: Negative.   Endocrine: Negative.   Genitourinary: Negative.   Musculoskeletal: Negative.   Skin: Negative.   Allergic/Immunologic: Negative.     Hematological: Negative.   Psychiatric/Behavioral: Negative.      Objective: Vital Signs: BP 105/61 (BP Location: Left Arm, Patient Position: Sitting, Cuff Size: Normal)   Pulse 61   Ht 5' 1"  (1.549 m)   Wt 170 lb (77.1 kg)   BMI 32.12 kg/m   Physical Exam  Ortho Exam  Specialty Comments:  No specialty comments available.  Imaging: No results found.   PMFS History: Patient Active Problem List   Diagnosis Date Noted  . Morbid obesity with body mass index (BMI) of 45.0 to 49.9 in adult [facility]) 07/06/2018  . Type 2 diabetes mellitus without complication, without long-term current use of insulin ([place]) 05/22/2017  . Class 3 obesity with serious comorbidity and body mass index (BMI) of 40.0 to 44.9 in adult 05/21/2017  . Family history of colon cancer 04/01/2017  . Irritable bowel syndrome with diarrhea 04/01/2017  . NSAID long-term use 04/01/2017  . Lymphocytosis 02/24/2017  . Essential hypertension 02/12/2017  . Mild intermittent asthma with acute exacerbation 12/12/2016  . Left shoulder pain 06/02/2015  . Insomnia 05/12/2014  . Fibromyalgia 05/12/2014  . Allergic rhinitis 03/28/2014  . Osteoarthritis 12/20/2013  . Cervical strain 06/21/2013  . Low back pain 06/21/2013  . Metatarsalgia of both feet 03/11/2013  . Sciatica 01/25/2013  . Restless leg syndrome 11/24/2012  . Pure hypercholesterolemia 10/20/2012  . OSA on CPAP 10/20/2012  . Depression  10/20/2012  . Iron deficiency anemia, unspecified 10/01/2012  . Snoring 10/01/2012  . Diabetes mellitus, type 2 ([place]) 12/31/2010  . ANAL FISSURE 12/31/2010  . Morbid obesity ([place]) 01/08/2007  . CONSTIPATION 01/08/2007   Past Medical History:  Diagnosis Date  . Allergic rhinitis, cause unspecified   . Anemia   . Arthritis   . Asthma   . Back pain   . Chest pain   . Constipation   . CTS (carpal tunnel syndrome)   . Cystocele   . Depression   . Diabetes mellitus   . Dysfunction of eustachian tube   . Fatty liver    . Fissure, anal   . Gallbladder problem   . Genital herpes   . GERD (gastroesophageal reflux disease)   . Hemorrhoid   . Hx of migraine headaches   . Hyperlipidemia   . Hypertension   . IBS (irritable bowel syndrome)   . Insomnia   . Iron deficiency anemia, unspecified   . Joint pain   . Nausea   . Obesity   . OSA on CPAP   . Osteoarthritis   . Rectocele   . Sleep apnea   . TMJ (dislocation of temporomandibular joint)     Family History  Problem Relation Age of Onset  . Hypertension Mother 51  . Osteoarthritis Mother   . Diabetes Mother   . Colon cancer Mother 41  . Cancer Mother 46       colon cancer  . Hyperlipidemia Mother   . Obesity Mother   . Sudden death Mother   . Arthritis Mother   . Colon cancer Paternal Grandmother   . Cancer Paternal Grandmother        colon cancer  . Stroke Maternal Grandmother   . Colon cancer Maternal Grandmother   . Colon polyps Brother        x 2 brother  . Diabetes Daughter   . Hypertension Daughter   . Sleep apnea Daughter   . Mental illness Daughter        depression  . Migraines Daughter   . Cirrhosis Brother        alcoholism  . Alcohol abuse Brother   . Migraines Daughter   . Mental illness Daughter        anxiety attacks  . Protein S deficiency Daughter   . Sleep apnea Brother   . Hyperlipidemia Brother   . Migraines Brother   . Obstructive Sleep Apnea Brother   . Colon cancer Paternal Aunt     Past Surgical History:  Procedure Laterality Date  . ABDOMINAL HYSTERECTOMY  11/11/2010   [place] Plan.  Ovaries intact.  Uterine fibroids with DUB.  [name] CARPAL TUNNEL RELEASE  1989   Left  . CHOLECYSTECTOMY    . CHOLECYSTECTOMY    . COLONOSCOPY  06/29/2012   normal.  Repeat in 5 years.  [name] [place]  . ESOPHAGOGASTRODUODENOSCOPY  12/13/2011   dysphagia.  Henrene Pastor.  Normal.  . 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 Sleep study  09/11/2012   severe OSA.  CPAP titration at 12 cm water pressure.  . TUBAL LIGATION  1988   Social History   Occupational History  . 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 Smoking status:  Former Smoker  . Smokeless tobacco: Never Used  . Tobacco comment: smoked occasionally longest 6 mos.  Substance and Sexual Activity  . Alcohol use: No    Alcohol/week: 1.0 standard drinks    Types: 1 Glasses of wine per week  . Drug use: No  . Sexual activity: Never    Birth control/protection: None

## 2020-02-16 DIAGNOSIS — M25762 Osteophyte, left knee: Secondary | ICD-10-CM | POA: Diagnosis not present

## 2020-02-16 DIAGNOSIS — M1712 Unilateral primary osteoarthritis, left knee: Secondary | ICD-10-CM | POA: Diagnosis not present

## 2020-02-16 DIAGNOSIS — M25562 Pain in left knee: Secondary | ICD-10-CM | POA: Diagnosis not present

## 2020-02-17 DIAGNOSIS — M25561 Pain in right knee: Secondary | ICD-10-CM | POA: Diagnosis not present

## 2020-02-17 DIAGNOSIS — M1711 Unilateral primary osteoarthritis, right knee: Secondary | ICD-10-CM | POA: Diagnosis not present

## 2020-02-17 DIAGNOSIS — M222X1 Patellofemoral disorders, right knee: Secondary | ICD-10-CM | POA: Diagnosis not present

## 2020-02-17 DIAGNOSIS — R2689 Other abnormalities of gait and mobility: Secondary | ICD-10-CM | POA: Diagnosis not present

## 2020-02-17 DIAGNOSIS — M25761 Osteophyte, right knee: Secondary | ICD-10-CM | POA: Diagnosis not present

## 2020-02-21 MED FILL — ACYCLOVIR 400 MG TABLET: 400 | 90 days supply | Qty: 180 | Fill #1

## 2020-02-21 MED FILL — ATORVASTATIN 20 MG TABLET: 20 | 90 days supply | Qty: 90 | Fill #1

## 2020-02-21 MED FILL — GABAPENTIN 300 MG CAPSULE: 300 | 90 days supply | Qty: 180 | Fill #1

## 2020-02-21 MED FILL — PANTOPRAZOLE SOD DR 40 MG T: 40 | 90 days supply | Qty: 90 | Fill #1

## 2020-02-21 MED FILL — JARDIANCE 25 MG TABLET: 25 | 90 days supply | Qty: 90 | Fill #1

## 2020-02-21 MED FILL — traMADol HCL 50 MG TABS: 50 | 7 days supply | Qty: 40 | Fill #0

## 2020-03-29 DIAGNOSIS — F331 Major depressive disorder, recurrent, moderate: Secondary | ICD-10-CM | POA: Diagnosis not present

## 2020-03-29 MED FILL — ESCITALOPRAM 10 MG TABLET: 10 | 90 days supply | Qty: 90 | Fill #0

## 2020-04-26 MED FILL — FLUTICASONE PROP 50 MCG SPR: 50 | 90 days supply | Qty: 48 | Fill #2

## 2020-05-16 MED FILL — ATORVASTATIN 20 MG TABLET: 20 | 90 days supply | Qty: 90 | Fill #2

## 2020-05-16 MED FILL — JARDIANCE 25 MG TABLET: 25 | 90 days supply | Qty: 90 | Fill #2

## 2020-05-16 MED FILL — PANTOPRAZOLE SOD DR 40 MG T: 40 | 90 days supply | Qty: 90 | Fill #2

## 2020-05-16 MED FILL — TERCONAZOLE 0.8% VAGINAL CR: 0.8 | 3 days supply | Qty: 20 | Fill #1

## 2020-05-18 ENCOUNTER — Ambulatory Visit (INDEPENDENT_AMBULATORY_CARE_PROVIDER_SITE_OTHER): Payer: 59 | Admitting: Specialist

## 2020-05-18 ENCOUNTER — Other Ambulatory Visit: Payer: Self-pay

## 2020-05-18 ENCOUNTER — Encounter: Payer: Self-pay | Admitting: Specialist

## 2020-05-18 VITALS — BP 123/75 | HR 57 | Ht 61.0 in | Wt 170.0 lb

## 2020-05-18 DIAGNOSIS — M79601 Pain in right arm: Secondary | ICD-10-CM

## 2020-05-18 DIAGNOSIS — G8929 Other chronic pain: Secondary | ICD-10-CM | POA: Diagnosis not present

## 2020-05-18 DIAGNOSIS — M25562 Pain in left knee: Secondary | ICD-10-CM

## 2020-05-18 DIAGNOSIS — M4316 Spondylolisthesis, lumbar region: Secondary | ICD-10-CM

## 2020-05-18 DIAGNOSIS — M1712 Unilateral primary osteoarthritis, left knee: Secondary | ICD-10-CM | POA: Diagnosis not present

## 2020-05-18 DIAGNOSIS — M1711 Unilateral primary osteoarthritis, right knee: Secondary | ICD-10-CM | POA: Diagnosis not present

## 2020-05-18 NOTE — Progress Notes (Signed)
Office Visit Note   Patient: Cristina [name]           Date of Birth: [birth year]           MRN: [id number] Visit Date: 05/18/2020              Requested by: [name], MD [place],  [place] [id number] PCP: [name], MD   Assessment & Plan: Visit Diagnoses:  1. Unilateral primary osteoarthritis, right knee   2. Unilateral primary osteoarthritis, left knee   3. Chronic pain of left knee   4. Spondylolisthesis, lumbar region   5. Right arm pain     Plan:Avoid bending, stooping and avoid lifting weights greater than 10 lbs. Avoid prolong standing and walking. Avoid frequent bending and stooping  No lifting greater than 10 lbs. May use ice or moist heat for pain. Weight loss is of benefit. Handicap license is approved. Schedule you an appointment to see Dr. Marlou Sa for consideration of left total knee replacement for severe P-F arthrosis  Walker wheels with seat with brakes.   Orders:  No orders of the defined types were placed in this encounter.  No orders of the defined types were placed in this encounter.     Procedures: No procedures performed   Clinical Data: No additional findings.   Subjective: Chief Complaint  Patient presents with  . Right Knee - Follow-up  . Left Knee - Follow-up  . Lower Back - Follow-up    HPI  Review of Systems   Objective: Vital Signs: BP 123/75 (BP Location: Left Arm, Patient Position: Sitting)   Pulse (!) 57   Ht 5' 1"  (1.549 m)   Wt 170 lb (77.1 kg)   BMI 32.12 kg/m   Physical Exam  Ortho Exam  Specialty Comments:  No specialty comments available.  Imaging: No results found.   PMFS History: Patient Active Problem List   Diagnosis Date Noted  . Morbid obesity with body mass index (BMI) of 45.0 to 49.9 in adult [facility]) 07/06/2018  . Type 2 diabetes mellitus without complication, without long-term current use of insulin ([place]) 05/22/2017  . Class 3 obesity with serious comorbidity and body  mass index (BMI) of 40.0 to 44.9 in adult 05/21/2017  . Family history of colon cancer 04/01/2017  . Irritable bowel syndrome with diarrhea 04/01/2017  . NSAID long-term use 04/01/2017  . Lymphocytosis 02/24/2017  . Essential hypertension 02/12/2017  . Mild intermittent asthma with acute exacerbation 12/12/2016  . Left shoulder pain 06/02/2015  . Insomnia 05/12/2014  . Fibromyalgia 05/12/2014  . Allergic rhinitis 03/28/2014  . Osteoarthritis 12/20/2013  . Cervical strain 06/21/2013  . Low back pain 06/21/2013  . Metatarsalgia of both feet 03/11/2013  . Sciatica 01/25/2013  . Restless leg syndrome 11/24/2012  . Pure hypercholesterolemia 10/20/2012  . OSA on CPAP 10/20/2012  . Depression 10/20/2012  . Iron deficiency anemia, unspecified 10/01/2012  . Snoring 10/01/2012  . Diabetes mellitus, type 2 ([place]) 12/31/2010  . ANAL FISSURE 12/31/2010  . Morbid obesity ([place]) 01/08/2007  . CONSTIPATION 01/08/2007   Past Medical History:  Diagnosis Date  . Allergic rhinitis, cause unspecified   . Anemia   . Arthritis   . Asthma   . Back pain   . Chest pain   . Constipation   . CTS (carpal tunnel syndrome)   . Cystocele   . Depression   . Diabetes mellitus   . Dysfunction of eustachian tube   . Fatty  liver   . Fissure, anal   . Gallbladder problem   . Genital herpes   . GERD (gastroesophageal reflux disease)   . Hemorrhoid   . Hx of migraine headaches   . Hyperlipidemia   . Hypertension   . IBS (irritable bowel syndrome)   . Insomnia   . Iron deficiency anemia, unspecified   . Joint pain   . Nausea   . Obesity   . OSA on CPAP   . Osteoarthritis   . Rectocele   . Sleep apnea   . TMJ (dislocation of temporomandibular joint)     Family History  Problem Relation Age of Onset  . Hypertension Mother 2  . Osteoarthritis Mother   . Diabetes Mother   . Colon cancer Mother 29  . Cancer Mother 46       colon cancer  . Hyperlipidemia Mother   . Obesity Mother   . Sudden  death Mother   . Arthritis Mother   . Colon cancer Paternal Grandmother   . Cancer Paternal Grandmother        colon cancer  . Stroke Maternal Grandmother   . Colon cancer Maternal Grandmother   . Colon polyps Brother        x 2 brother  . Diabetes Daughter   . Hypertension Daughter   . Sleep apnea Daughter   . Mental illness Daughter        depression  . Migraines Daughter   . Cirrhosis Brother        alcoholism  . Alcohol abuse Brother   . Migraines Daughter   . Mental illness Daughter        anxiety attacks  . Protein S deficiency Daughter   . Sleep apnea Brother   . Hyperlipidemia Brother   . Migraines Brother   . Obstructive Sleep Apnea Brother   . Colon cancer Paternal Aunt     Past Surgical History:  Procedure Laterality Date  . ABDOMINAL HYSTERECTOMY  11/11/2010   [place] Plan.  Ovaries intact.  Uterine fibroids with DUB.  [name] CARPAL TUNNEL RELEASE  1989   Left  . CHOLECYSTECTOMY    . CHOLECYSTECTOMY    . COLONOSCOPY  06/29/2012   normal.  Repeat in 5 years.  [name] [place]  . ESOPHAGOGASTRODUODENOSCOPY  12/13/2011   dysphagia.  Henrene Pastor.  Normal.  . 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 Sleep study  09/11/2012   severe OSA.  CPAP titration at 12 cm water pressure.  . TUBAL LIGATION  1988   Social History   Occupational History  . 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 Smoking status: Former Research scientist (life sciences)  . Smokeless tobacco: Never Used  . Tobacco comment: smoked occasionally longest 6 mos.  Vaping Use  . Vaping Use: Never used  Substance and Sexual Activity  . Alcohol use: No    Alcohol/week: 1.0 standard drink    Types: 1 Glasses of wine per week  . Drug use: No  . Sexual activity: Never    Birth  control/protection: None

## 2020-05-18 NOTE — Patient Instructions (Signed)
Plan:Avoid bending, stooping and avoid lifting weights greater than 10 lbs. Avoid prolong standing and walking. Avoid frequent bending and stooping  No lifting greater than 10 lbs. May use ice or moist heat for pain. Weight loss is of benefit. Handicap license is approved. Schedule you an appointment to see Dr. Marlou Sa for consideration of left total knee replacement for severe P-F arthrosis  Walker wheels with seat with brakes.

## 2020-05-23 MED FILL — DICLOFENAC SODIUM 1 % GEL: 1 | 30 days supply | Qty: 100 | Fill #0

## 2020-05-24 DIAGNOSIS — F419 Anxiety disorder, unspecified: Secondary | ICD-10-CM | POA: Diagnosis not present

## 2020-05-24 DIAGNOSIS — F329 Major depressive disorder, single episode, unspecified: Secondary | ICD-10-CM | POA: Diagnosis not present

## 2020-05-24 DIAGNOSIS — F5104 Psychophysiologic insomnia: Secondary | ICD-10-CM | POA: Diagnosis not present

## 2020-05-24 DIAGNOSIS — E669 Obesity, unspecified: Secondary | ICD-10-CM | POA: Diagnosis not present

## 2020-05-25 ENCOUNTER — Ambulatory Visit: Payer: 59 | Admitting: Surgery

## 2020-06-09 ENCOUNTER — Ambulatory Visit (INDEPENDENT_AMBULATORY_CARE_PROVIDER_SITE_OTHER): Payer: 59 | Admitting: Internal Medicine

## 2020-06-09 ENCOUNTER — Other Ambulatory Visit: Payer: Self-pay

## 2020-06-09 ENCOUNTER — Encounter: Payer: Self-pay | Admitting: Internal Medicine

## 2020-06-09 VITALS — BP 92/64 | HR 64 | Ht 60.0 in | Wt 181.2 lb

## 2020-06-09 DIAGNOSIS — R079 Chest pain, unspecified: Secondary | ICD-10-CM | POA: Diagnosis not present

## 2020-06-09 NOTE — Patient Instructions (Signed)
Medication Instructions:  No changes *If you need a refill on your cardiac medications before your next appointment, please call your pharmacy*   Lab Work: none If you have labs (blood work) drawn today and your tests are completely normal, you will receive your results only by: [name] MyChart Message (if you have MyChart) OR . A paper copy in the mail If you have any lab test that is abnormal or we need to change your treatment, we will call you to review the results.   Testing/Procedures: none   Follow-Up: As planned  Other Instructions

## 2020-06-09 NOTE — Progress Notes (Signed)
Cardiology Office Note   Date:  06/09/2020   ID:  Cristina [name], DOB 1960/12/15, MRN [id number]  PCP:  [name], MD  Cardiologist:   Dorris Carnes, MD   Pt presents for eval of CP     History of Present Illness: Cristina [name] is a 59 y.o. female with a history of chest pain   She has been seen by P Nahser in past as well as Beckie Salts  Myovue in past (2018) showed no ischemia   LVEF normal  Echo in 5974 Gr 1 diastolic dysfunction Pt also has a hisctory of DM and GERD  The pt says she has had  CP for 3 days   Constant  Not asssoc with activity  Dull   No exacerbating factors     Current Meds  Medication Sig  . acyclovir (ZOVIRAX) 400 MG tablet TAKE 1 TABLET BY MOUTH 2 TIMES DAILY.  [name] albuterol (PROAIR HFA) 108 (90 Base) MCG/ACT inhaler Inhale 2 puffs into the lungs every 6 (six) hours as needed. (Patient taking differently: Inhale 2 puffs into the lungs every 6 (six) hours as needed for wheezing or shortness of breath. )  . ANUCORT-HC 25 MG suppository PLACE 1 SUPPOSITORY RECTALLY 2 TIMES DAILY AS NEEDED FOR HEMORRHOIDS.  [name] atorvastatin (LIPITOR) 20 MG tablet Take 1 tablet (20 mg total) by mouth daily. (Patient taking differently: Take 20 mg by mouth at bedtime. )  . azelastine (ASTELIN) 0.1 % nasal spray PLACE 2 SPRAYS INTO BOTH NOSTRILS 2 TIMES DAILY AS DIRECTED (Patient taking differently: Place 2 sprays into both nostrils 2 (two) times daily as needed for rhinitis or allergies. PLACE 2 SPRAYS INTO BOTH NOSTRILS 2 TIMES DAILY AS DIRECTE)  . azelastine (OPTIVAR) 0.05 % ophthalmic solution Place 1 drop into both eyes daily as needed (allergies).   [name] buPROPion (WELLBUTRIN SR) 200 MG 12 hr tablet Take 1 tablet (200 mg total) by mouth daily.  . calcium carbonate (OS-CAL) 1250 (500 Ca) MG chewable tablet Chew 1 tablet by mouth 3 (three) times daily.  . calcium carbonate (TUMS - DOSED IN MG ELEMENTAL CALCIUM) 500 MG chewable tablet Chew 3-4 tablets by mouth daily as needed for indigestion or  heartburn.  . cetirizine (ZYRTEC) 10 MG tablet Take 10 mg by mouth at bedtime.   . diclofenac sodium (VOLTAREN) 1 % GEL Apply 4 g topically 4 (four) times daily. (Patient taking differently: Apply 4 g topically 4 (four) times daily as needed (pain). )  . diltiazem 2 % GEL Apply 1 application topically 3 (three) times daily. (Patient taking differently: Apply 1 application topically 3 (three) times daily as needed (anal fissure). )  . empagliflozin (JARDIANCE) 10 MG TABS tablet Take 10 mg by mouth daily.  [name] escitalopram (LEXAPRO) 10 MG tablet Take 10 mg by mouth daily.  . fluticasone (FLONASE) 50 MCG/ACT nasal spray PLACE 2 SPRAYS INTO BOTH NOSTRILS DAILY.  [name] gabapentin (NEURONTIN) 300 MG capsule Take 1 capsule (300 mg total) by mouth 3 (three) times daily.  [name] glucose blood test strip accucheck guide strips and fast clix lancets Use as instructed dm 2  . hydrocortisone 2.5 % ointment Apply topically 2 (two) times daily. (Patient taking differently: Apply 1 application topically 2 (two) times daily as needed (rash). )  . Multiple Vitamins-Minerals (BARIATRIC FUSION) CHEW Chew 1 tablet by mouth 2 (two) times daily.  . nitroGLYCERIN (NITROSTAT) 0.4 MG SL tablet Place 1 tablet (0.4 mg total) under the tongue every 5 (five) minutes  as needed for chest pain.  . pantoprazole (PROTONIX) 40 MG tablet TAKE 1 TABLET BY MOUTH 2 TIMES DAILY.  [name] TRUEPLUS LANCETS 30G MISC USE TO CHECK BLOOD GLUCOSE DAILY AS DIRECTED  . [DISCONTINUED] Multiple Vitamin (MULTIVITAMIN) capsule Take 1 capsule by mouth daily.     Allergies:   Fish oil   Past Medical History:  Diagnosis Date  . Allergic rhinitis, cause unspecified   . Anemia   . Arthritis   . Asthma   . Back pain   . Chest pain   . Constipation   . CTS (carpal tunnel syndrome)   . Cystocele   . Depression   . Diabetes mellitus   . Dysfunction of eustachian tube   . Fatty liver   . Fissure, anal   . Gallbladder problem   . Genital herpes   . GERD  (gastroesophageal reflux disease)   . Hemorrhoid   . Hx of migraine headaches   . Hyperlipidemia   . Hypertension   . IBS (irritable bowel syndrome)   . Insomnia   . Iron deficiency anemia, unspecified   . Joint pain   . Nausea   . Obesity   . OSA on CPAP   . Osteoarthritis   . Rectocele   . Sleep apnea   . TMJ (dislocation of temporomandibular joint)     Past Surgical History:  Procedure Laterality Date  . ABDOMINAL HYSTERECTOMY  11/11/2010   [place] Plan.  Ovaries intact.  Uterine fibroids with DUB.  [name] CARPAL TUNNEL RELEASE  1989   Left  . CHOLECYSTECTOMY    . CHOLECYSTECTOMY    . COLONOSCOPY  06/29/2012   normal.  Repeat in 5 years.  [name] [place]  . ESOPHAGOGASTRODUODENOSCOPY  12/13/2011   dysphagia.  Henrene Pastor.  Normal.  . 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 Sleep study  09/11/2012   severe OSA.  CPAP titration at 12 cm water pressure.  . TUBAL LIGATION  1988     Social History:  The patient  reports that she has quit smoking. She has never used smokeless tobacco. She reports that she does not drink alcohol and does not use drugs.   Family History:  The patient's family history includes Alcohol abuse in her brother; Arthritis in her mother; Cancer in her paternal grandmother; Cancer (age of onset: 2) in her mother; Cirrhosis in her brother; Colon cancer in her maternal grandmother, paternal aunt, and paternal grandmother; Colon cancer (age of onset: 36) in her mother; Colon polyps in her brother; Diabetes in her daughter and mother; Hyperlipidemia in her brother and mother; Hypertension in her daughter; Hypertension (age of onset: 82) in her mother; Mental illness in her daughter and daughter; Migraines in her brother, daughter, and daughter; Obesity in her mother; Obstructive Sleep Apnea in her  brother; Osteoarthritis in her mother; Protein S deficiency in her daughter; Sleep apnea in her brother and daughter; Stroke in her maternal grandmother; Sudden death in her mother.    ROS:  Please see the history of present illness. All other systems are reviewed and  Negative to the above problem except as noted.    PHYSICAL EXAM: VS:  BP (!) 92/64   Pulse 64   Ht 5' (1.524 m)   Wt 181 lb 4 oz (82.2 kg)   BMI 35.40 kg/m   GEN: Well  nourished, well developed, in no acute distress  HEENT: normal  Neck: no JVD, carotid bruits, or masses Cardiac: RRR; no murmurs, rubs, or gallops,no LE  edema  Chest   Tender L parasternal area    Respiratory:  clear to auscultation bilaterally GI: soft, nontender, nondistended, + BS  No hepatomegaly  MS: no deformity Moving all extremities   Skin: warm and dry, no rash Neuro:  Strength and sensation are intact Psych: euthymic mood, full affect   EKG:  EKG is ordered today.  SR 64 bpm     NOnspecific ST changes     Lipid Panel    Component Value Date/Time   CHOL 186 04/01/2018 1126   TRIG 182 (H) 04/01/2018 1126   HDL 40 04/01/2018 1126   CHOLHDL 4.7 (H) 04/01/2018 1126   CHOLHDL 3.7 08/27/2016 1157   VLDL 23 08/27/2016 1157   LDLCALC 110 (H) 04/01/2018 1126      Wt Readings from Last 3 Encounters:  06/09/20 181 lb 4 oz (82.2 kg)  05/18/20 170 lb (77.1 kg)  02/10/20 170 lb (77.1 kg)      ASSESSMENT AND PLAN:  1  Chest pain.   Pt's pain appears to be chest wall/ musculoskeleltal    I do not think cardiac      Recomm rest, tylenol (pt has gastrc surgery, cant take motrin)    Current medicines are reviewed at length with the patient today.  The patient does not have concerns regarding medicines.  Signed, Dorris Carnes, MD  06/09/2020 3:16 PM    [facility] [address], [place], [place]  [id number] Phone: [phone]; Fax: [phone]

## 2020-06-12 MED FILL — DICLOFENAC SODIUM 1 % GEL: 1 | 30 days supply | Qty: 400 | Fill #0

## 2020-06-30 NOTE — Addendum Note (Signed)
Addended by: Mendel Ryder on: 06/30/2020 02:47 PM   Modules accepted: Orders

## 2020-07-18 MED FILL — GABAPENTIN 300 MG CAPSULE: 300 | 90 days supply | Qty: 180 | Fill #2

## 2020-07-18 MED FILL — ACYCLOVIR 400 MG TABLET: 400 | 90 days supply | Qty: 180 | Fill #2

## 2020-07-18 MED FILL — DICLOFENAC SODIUM 1 % GEL: 1 | 30 days supply | Qty: 400 | Fill #1

## 2020-07-18 MED FILL — ESCITALOPRAM 10 MG TABLET: 10 | 90 days supply | Qty: 90 | Fill #1

## 2020-07-18 MED FILL — buPROPion HCL 100 MG TABS: 100 | 90 days supply | Qty: 180 | Fill #1

## 2020-08-02 ENCOUNTER — Other Ambulatory Visit (HOSPITAL_COMMUNITY): Payer: Self-pay | Admitting: Family Medicine

## 2020-08-02 DIAGNOSIS — E119 Type 2 diabetes mellitus without complications: Secondary | ICD-10-CM | POA: Diagnosis not present

## 2020-08-02 DIAGNOSIS — Z0000 Encounter for general adult medical examination without abnormal findings: Secondary | ICD-10-CM | POA: Diagnosis not present

## 2020-08-02 DIAGNOSIS — J4521 Mild intermittent asthma with (acute) exacerbation: Secondary | ICD-10-CM | POA: Diagnosis not present

## 2020-08-02 DIAGNOSIS — E7800 Pure hypercholesterolemia, unspecified: Secondary | ICD-10-CM | POA: Diagnosis not present

## 2020-08-02 DIAGNOSIS — Z23 Encounter for immunization: Secondary | ICD-10-CM | POA: Diagnosis not present

## 2020-08-02 DIAGNOSIS — A6004 Herpesviral vulvovaginitis: Secondary | ICD-10-CM | POA: Diagnosis not present

## 2020-08-02 MED FILL — ALBUTEROL SULFATE HFA 108 (: 108 (90 BAS | 75 days supply | Qty: 54 | Fill #0

## 2020-08-02 MED FILL — FLUTICASONE PROP 50 MCG SPR: 50 | 90 days supply | Qty: 48 | Fill #0

## 2020-08-02 MED FILL — ATORVASTATIN 20 MG TABLET: 20 | 90 days supply | Qty: 90 | Fill #0

## 2020-08-02 MED FILL — AZELASTINE HCL 0.05% DROPS: 0.05 | 90 days supply | Qty: 18 | Fill #0

## 2020-08-02 MED FILL — PANTOPRAZOLE SOD DR 40 MG T: 40 | 90 days supply | Qty: 180 | Fill #0

## 2020-08-02 MED FILL — JARDIANCE 25 MG TABLET: 25 | 90 days supply | Qty: 90 | Fill #0

## 2020-08-02 MED FILL — FREESTYLE LITE TEST STRIP: 50 days supply | Qty: 50 | Fill #0

## 2020-08-17 ENCOUNTER — Ambulatory Visit: Payer: 59 | Admitting: Specialist

## 2020-09-06 DIAGNOSIS — M1712 Unilateral primary osteoarthritis, left knee: Secondary | ICD-10-CM | POA: Diagnosis not present

## 2020-09-07 DIAGNOSIS — M1711 Unilateral primary osteoarthritis, right knee: Secondary | ICD-10-CM | POA: Diagnosis not present

## 2020-09-13 DIAGNOSIS — M1712 Unilateral primary osteoarthritis, left knee: Secondary | ICD-10-CM | POA: Diagnosis not present

## 2020-09-15 ENCOUNTER — Other Ambulatory Visit: Payer: Self-pay

## 2020-09-15 ENCOUNTER — Ambulatory Visit (INDEPENDENT_AMBULATORY_CARE_PROVIDER_SITE_OTHER): Payer: 59 | Admitting: Specialist

## 2020-09-15 ENCOUNTER — Encounter: Payer: Self-pay | Admitting: Specialist

## 2020-09-15 ENCOUNTER — Ambulatory Visit (INDEPENDENT_AMBULATORY_CARE_PROVIDER_SITE_OTHER): Payer: 59

## 2020-09-15 VITALS — BP 107/69 | HR 73 | Ht 59.75 in | Wt 178.8 lb

## 2020-09-15 DIAGNOSIS — M4316 Spondylolisthesis, lumbar region: Secondary | ICD-10-CM

## 2020-09-15 DIAGNOSIS — M79672 Pain in left foot: Secondary | ICD-10-CM

## 2020-09-15 DIAGNOSIS — S96911A Strain of unspecified muscle and tendon at ankle and foot level, right foot, initial encounter: Secondary | ICD-10-CM | POA: Diagnosis not present

## 2020-09-15 DIAGNOSIS — M25572 Pain in left ankle and joints of left foot: Secondary | ICD-10-CM | POA: Diagnosis not present

## 2020-09-15 NOTE — Patient Instructions (Addendum)
Voltaren gel applied up to 4 x a day  For pain locally A left ankle soft brace for use for 2 weeks when up and walking.  Use stiffer soled shoes with a good arch support.  Theraband for use with performing exercises to strengthing the left ankle supporting muscles.  Ankle Exercises Ask your health care provider which exercises are safe for you. Do exercises exactly as told by your health care provider and adjust them as directed. It is normal to feel mild stretching, pulling, tightness, or mild discomfort as you do these exercises. Stop right away if you feel sudden pain or your pain gets worse. Do not begin these exercises until told by your health care provider. Stretching and range-of-motion exercises These exercises warm up your muscles and joints and improve the movement and flexibility of your ankle. These exercises may also help to relieve pain. Dorsiflexion/plantar flexion  1. Sit with your __________ knee straight or bent. Do not rest your foot on anything. 2. Flex your __________ ankle to tilt the top of your foot toward your shin. This is called dorsiflexion. 3. Hold this position for __________ seconds. 4. Point your toes downward to tilt the top of your foot away from your shin. This is called plantar flexion. 5. Hold this position for __________ seconds. Repeat __________ times. Complete this exercise __________ times a day. Ankle alphabet  1. Sit with your __________ foot supported at your lower leg. ? Do not rest your foot on anything. ? Make sure your foot has room to move freely. 2. Think of your __________ foot as a paintbrush: ? Move your foot to trace each letter of the alphabet in the air. Keep your hip and knee still while you trace the letters. Trace every letter from A to Z. ? Make the letters as large as you can without causing or increasing any discomfort. Repeat __________ times. Complete this exercise __________ times a day. Passive ankle dorsiflexion This is an  exercise in which something or someone moves your ankle for you. You do not move it yourself. 1. Sit on a chair that is placed on a non-carpeted surface. 2. Place your __________ foot on the floor, directly under your __________ knee. Extend your __________ leg for support. 3. Keeping your heel down, slide your __________ foot back toward the chair until you feel a stretch at your ankle or calf. If you do not feel a stretch, slide your buttocks forward to the edge of the chair while keeping your heel down. 4. Hold this stretch for __________ seconds. Repeat __________ times. Complete this exercise __________ times a day. Strengthening exercises These exercises build strength and endurance in your ankle. Endurance is the ability to use your muscles for a long time, even after they get tired. Dorsiflexors These are muscles that lift your foot up. 1. Secure a rubber exercise band or tube to an object, such as a table leg, that will stay still when the band is pulled. Secure the other end around your __________ foot. 2. Sit on the floor, facing the object with your __________ leg extended. The band or tube should be slightly tense when your foot is relaxed. 3. Slowly flex your __________ ankle and toes to bring your foot toward your shin. 4. Hold this position for __________ seconds. 5. Slowly return your foot to the starting position, controlling the band as you do that. Repeat __________ times. Complete this exercise __________ times a day. Plantar flexors These are muscles that push  your foot down. 1. Sit on the floor with your __________ leg extended. 2. Loop a rubber exercise band or tube around the ball of your __________ foot. The ball of your foot is on the walking surface, right under your toes. The band or tube should be slightly tense when your foot is relaxed. 3. Slowly point your toes downward, pushing them away from you. 4. Hold this position for __________ seconds. 5. Slowly release  the tension in the band or tube, controlling smoothly until your foot is back in the starting position. Repeat __________ times. Complete this exercise __________ times a day. Towel curls  1. Sit in a chair on a non-carpeted surface, and put your feet on the floor. 2. Place a towel in front of your feet. If told by your health care provider, add a __________ pound weight to the end of the towel. 3. Keeping your heel on the floor, put your __________ foot on the towel. 4. Pull the towel toward you by grabbing the towel with your toes and curling them under. Keep your heel on the floor. 5. Let your toes relax. 6. Grab the towel again. Keep pulling the towel until it is completely underneath your foot. Repeat __________ times. Complete this exercise __________ times a day. Standing plantar flexion This is an exercise in which you use your toes to lift your body's weight while standing. 1. Stand with your feet shoulder-width apart. 2. Keep your weight spread evenly over the width of your feet while you rise up on your toes. Use a wall or table to steady yourself if needed, but try not to use it for support. 3. If this exercise is too easy, try these options: ? Shift your weight toward your __________ leg until you feel challenged. ? If told by your health care provider, lift your uninjured leg off the floor. 4. Hold this position for __________ seconds. Repeat __________ times. Complete this exercise __________ times a day. Tandem walking 1. Stand with one foot directly in front of the other. 2. Slowly raise your back foot up, lifting your heel before your toes, and place it directly in front of your other foot. 3. Continue to walk in this heel-to-toe way for __________ or for as long as told by your health care provider. Have a countertop or wall nearby to use if needed to keep your balance, but try not to hold onto anything for support. Repeat __________ times. Complete this exercise __________  times a day. This information is not intended to replace advice given to you by your health care provider. Make sure you discuss any questions you have with your health care provider. Document Revised: 07/25/2018 Document Reviewed: 07/27/2018 Elsevier Patient Education  [place]. Avoid bending, stooping and avoid lifting weights greater than 10 lbs. Avoid prolong standing and walking. Avoid frequent bending and stooping  No lifting greater than 10 lbs. May use ice or moist heat for pain. Weight loss is of benefit. Handicap license is approved. Tylenol arthritis strength up to 6 tablets per day.

## 2020-09-15 NOTE — Progress Notes (Signed)
Office Visit Note   Patient: Cristina [name]           Date of Birth: 04-23-1961           MRN: [id number] Visit Date: 09/15/2020              Requested by: [name], MD [place],  [place] [address] PCP: [name], MD   Assessment & Plan: Visit Diagnoses:  1. Foot pain, left   2. Spondylolisthesis, lumbar region   3. Right foot strain, initial encounter     Plan:  Voltaren gel applied up to 4 x a day  For pain locally A left ankle soft brace for use for 2 weeks when up and walking.  Use stiffer soled shoes with a good arch support.  Theraband for use with performing exercises to strengthing the left ankle supporting muscles.  Ankle Exercises Ask your health care provider which exercises are safe for you. Do exercises exactly as told by your health care provider and adjust them as directed. It is normal to feel mild stretching, pulling, tightness, or mild discomfort as you do these exercises. Stop right away if you feel sudden pain or your pain gets worse. Do not begin these exercises until told by your health care provider. Stretching and range-of-motion exercises These exercises warm up your muscles and joints and improve the movement and flexibility of your ankle. These exercises may also help to relieve pain. Dorsiflexion/plantar flexion  1. Sit with your __________ knee straight or bent. Do not rest your foot on anything. 2. Flex your __________ ankle to tilt the top of your foot toward your shin. This is called dorsiflexion. 3. Hold this position for __________ seconds. 4. Point your toes downward to tilt the top of your foot away from your shin. This is called plantar flexion. 5. Hold this position for __________ seconds. Repeat __________ times. Complete this exercise __________ times a day. Ankle alphabet  1. Sit with your __________ foot supported at your lower leg. ? Do not rest your foot on anything. ? Make sure your foot has room to move  freely. 2. Think of your __________ foot as a paintbrush: ? Move your foot to trace each letter of the alphabet in the air. Keep your hip and knee still while you trace the letters. Trace every letter from A to Z. ? Make the letters as large as you can without causing or increasing any discomfort. Repeat __________ times. Complete this exercise __________ times a day. Passive ankle dorsiflexion This is an exercise in which something or someone moves your ankle for you. You do not move it yourself. 1. Sit on a chair that is placed on a non-carpeted surface. 2. Place your __________ foot on the floor, directly under your __________ knee. Extend your __________ leg for support. 3. Keeping your heel down, slide your __________ foot back toward the chair until you feel a stretch at your ankle or calf. If you do not feel a stretch, slide your buttocks forward to the edge of the chair while keeping your heel down. 4. Hold this stretch for __________ seconds. Repeat __________ times. Complete this exercise __________ times a day. Strengthening exercises These exercises build strength and endurance in your ankle. Endurance is the ability to use your muscles for a long time, even after they get tired. Dorsiflexors These are muscles that lift your foot up. 1. Secure a rubber exercise band or tube to an object, such  as a table leg, that will stay still when the band is pulled. Secure the other end around your __________ foot. 2. Sit on the floor, facing the object with your __________ leg extended. The band or tube should be slightly tense when your foot is relaxed. 3. Slowly flex your __________ ankle and toes to bring your foot toward your shin. 4. Hold this position for __________ seconds. 5. Slowly return your foot to the starting position, controlling the band as you do that. Repeat __________ times. Complete this exercise __________ times a day. Plantar flexors These are muscles that push your foot  down. 1. Sit on the floor with your __________ leg extended. 2. Loop a rubber exercise band or tube around the ball of your __________ foot. The ball of your foot is on the walking surface, right under your toes. The band or tube should be slightly tense when your foot is relaxed. 3. Slowly point your toes downward, pushing them away from you. 4. Hold this position for __________ seconds. 5. Slowly release the tension in the band or tube, controlling smoothly until your foot is back in the starting position. Repeat __________ times. Complete this exercise __________ times a day. Towel curls  1. Sit in a chair on a non-carpeted surface, and put your feet on the floor. 2. Place a towel in front of your feet. If told by your health care provider, add a __________ pound weight to the end of the towel. 3. Keeping your heel on the floor, put your __________ foot on the towel. 4. Pull the towel toward you by grabbing the towel with your toes and curling them under. Keep your heel on the floor. 5. Let your toes relax. 6. Grab the towel again. Keep pulling the towel until it is completely underneath your foot. Repeat __________ times. Complete this exercise __________ times a day. Standing plantar flexion This is an exercise in which you use your toes to lift your body's weight while standing. 1. Stand with your feet shoulder-width apart. 2. Keep your weight spread evenly over the width of your feet while you rise up on your toes. Use a wall or table to steady yourself if needed, but try not to use it for support. 3. If this exercise is too easy, try these options: ? Shift your weight toward your __________ leg until you feel challenged. ? If told by your health care provider, lift your uninjured leg off the floor. 4. Hold this position for __________ seconds. Repeat __________ times. Complete this exercise __________ times a day. Tandem walking 1. Stand with one foot directly in front of the  other. 2. Slowly raise your back foot up, lifting your heel before your toes, and place it directly in front of your other foot. 3. Continue to walk in this heel-to-toe way for __________ or for as long as told by your health care provider. Have a countertop or wall nearby to use if needed to keep your balance, but try not to hold onto anything for support. Repeat __________ times. Complete this exercise __________ times a day. This information is not intended to replace advice given to you by your health care provider. Make sure you discuss any questions you have with your health care provider. Document Revised: 07/25/2018 Document Reviewed: 07/27/2018 Elsevier Patient Education  [place]. Avoid bending, stooping and avoid lifting weights greater than 10 lbs. Avoid prolong standing and walking. Avoid frequent bending and stooping  No lifting greater than 10 lbs.  May use ice or moist heat for pain. Weight loss is of benefit. Handicap license is approved.   Follow-Up Instructions: No follow-ups on file.   Orders:  Orders Placed This Encounter  Procedures  . XR Lumbar Spine 2-3 Views  . XR Foot Complete Left   No orders of the defined types were placed in this encounter.     Procedures: No procedures performed   Clinical Data: No additional findings.   Subjective: Chief Complaint  Patient presents with  . Lower Back - Follow-up  . Right Knee - Follow-up  . Left Knee - Follow-up    59 year old female with [place] working at [employer] full time. She has a spondylolisthesis we have followed for nearly  5-6 years. She is having pain with standing and walking And she is limited in both. Sitting is painful too. She has a grade 1-2 spondylolisthesis L4-5. No bowel or bladder difficulty. Takes gabapentin, voltaren gel and CBD for pain. She notices worsening pain whe CBD is stoppedy.    Review of Systems   Objective: Vital Signs: BP 107/69 (BP Location: Left  Arm, Patient Position: Sitting)   Pulse 73   Ht 4' 11.75" (1.518 m)   Wt 178 lb 12.8 oz (81.1 kg)   BMI 35.21 kg/m   Physical Exam  Ortho Exam  Specialty Comments:  No specialty comments available.  Imaging: XR Foot Complete Left  Result Date: 09/15/2020 Ap laateral and oblique radiographs left foot with mild sclerosis of the distal medial cuboid. No joint line narrowing present. 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 No fracture or stress area noted.   XR Lumbar Spine 2-3 Views  Result Date: 09/15/2020 AP and lateral flexionand extension radiographs of the lumbar spine with 7.5 mm of anterolisthesis at L4-5 similar to that seen on radiographs from nearly 4 years ago. Spondylolisthesis L4-5 grade 1 no significant change from 4 years ago.    PMFS History: Patient Active Problem List   Diagnosis Date Noted  . Morbid obesity with body mass index (BMI) of 45.0 to 49.9 in adult [facility]) 07/06/2018  . Type 2 diabetes mellitus without complication, without long-term current use of insulin ([place]) 05/22/2017  . Class 3 obesity with serious comorbidity and body mass index (BMI) of 40.0 to 44.9 in adult 05/21/2017  . Family history of colon cancer 04/01/2017  . Irritable bowel syndrome with diarrhea 04/01/2017  . NSAID long-term use 04/01/2017  . Lymphocytosis 02/24/2017  . Essential hypertension 02/12/2017  . Mild intermittent asthma with acute exacerbation 12/12/2016  . Left shoulder pain 06/02/2015  . Insomnia 05/12/2014  . Fibromyalgia 05/12/2014  . Allergic rhinitis 03/28/2014  . Osteoarthritis 12/20/2013  . Cervical strain 06/21/2013  . Low back pain 06/21/2013  . Metatarsalgia of both feet 03/11/2013  . Sciatica 01/25/2013  . Restless leg syndrome 11/24/2012  . Pure hypercholesterolemia 10/20/2012  . OSA on CPAP 10/20/2012  . Depression 10/20/2012  . Iron deficiency anemia, unspecified  10/01/2012  . Snoring 10/01/2012  . Diabetes mellitus, type 2 ([place]) 12/31/2010  . ANAL FISSURE 12/31/2010  . Morbid obesity ([place]) 01/08/2007  . CONSTIPATION 01/08/2007   Past Medical History:  Diagnosis Date  . Allergic rhinitis, cause unspecified   . Anemia   . Arthritis   . Asthma   . Back pain   . Chest pain   . Constipation   . CTS (carpal tunnel syndrome)   .  Cystocele   . Depression   . Diabetes mellitus   . Dysfunction of eustachian tube   . Fatty liver   . Fissure, anal   . Gallbladder problem   . Genital herpes   . GERD (gastroesophageal reflux disease)   . Hemorrhoid   . Hx of migraine headaches   . Hyperlipidemia   . Hypertension   . IBS (irritable bowel syndrome)   . Insomnia   . Iron deficiency anemia, unspecified   . Joint pain   . Nausea   . Obesity   . OSA on CPAP   . Osteoarthritis   . Rectocele   . Sleep apnea   . TMJ (dislocation of temporomandibular joint)     Family History  Problem Relation Age of Onset  . Hypertension Mother 34  . Osteoarthritis Mother   . Diabetes Mother   . Colon cancer Mother 30  . Cancer Mother 70       colon cancer  . Hyperlipidemia Mother   . Obesity Mother   . Sudden death Mother   . Arthritis Mother   . Colon cancer Paternal Grandmother   . Cancer Paternal Grandmother        colon cancer  . Stroke Maternal Grandmother   . Colon cancer Maternal Grandmother   . Colon polyps Brother        x 2 brother  . Diabetes Daughter   . Hypertension Daughter   . Sleep apnea Daughter   . Mental illness Daughter        depression  . Migraines Daughter   . Cirrhosis Brother        alcoholism  . Alcohol abuse Brother   . Migraines Daughter   . Mental illness Daughter        anxiety attacks  . Protein S deficiency Daughter   . Sleep apnea Brother   . Hyperlipidemia Brother   . Migraines Brother   . Obstructive Sleep Apnea Brother   . Colon cancer Paternal Aunt     Past Surgical History:  Procedure Laterality  Date  . ABDOMINAL HYSTERECTOMY  11/11/2010   [place] Plan.  Ovaries intact.  Uterine fibroids with DUB.  [name] CARPAL TUNNEL RELEASE  1989   Left  . CHOLECYSTECTOMY    . CHOLECYSTECTOMY    . COLONOSCOPY  06/29/2012   normal.  Repeat in 5 years.  [name] [place]  . ESOPHAGOGASTRODUODENOSCOPY  12/13/2011   dysphagia.  Henrene Pastor.  Normal.  . 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 Sleep study  09/11/2012   severe OSA.  CPAP titration at 12 cm water pressure.  . TUBAL LIGATION  1988   Social History   Occupational History  . 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 Smoking status: Former Research scientist (life sciences)  . Smokeless tobacco: Never Used  . Tobacco comment: smoked occasionally longest 6 mos.  Vaping Use  . Vaping Use: Never used  Substance and Sexual Activity  . Alcohol use: No    Alcohol/week: 1.0 standard drink    Types: 1 Glasses of wine per week  . Drug use: No  . Sexual activity: Never    Birth control/protection: None

## 2020-10-27 ENCOUNTER — Ambulatory Visit: Payer: 59 | Admitting: Specialist

## 2020-11-17 ENCOUNTER — Other Ambulatory Visit (HOSPITAL_COMMUNITY): Payer: Self-pay | Admitting: Family Medicine

## 2020-11-17 MED FILL — ATORVASTATIN CALCIUM 20 MG: 20 | 90 days supply | Qty: 90 | Fill #1

## 2020-11-17 MED FILL — PANTOPRAZOLE SOD DR 40 MG T: 40 | 90 days supply | Qty: 180 | Fill #1

## 2020-11-17 MED FILL — ESCITALOPRAM 10 MG TABLET: 10 | 90 days supply | Qty: 90 | Fill #0

## 2020-11-17 MED FILL — JARDIANCE 25 MG TABLET: 25 | 90 days supply | Qty: 90 | Fill #1

## 2020-11-24 ENCOUNTER — Telehealth: Payer: Self-pay | Admitting: Specialist

## 2020-11-24 ENCOUNTER — Encounter: Payer: Self-pay | Admitting: Specialist

## 2020-11-24 ENCOUNTER — Ambulatory Visit: Payer: 59 | Admitting: Specialist

## 2020-11-24 ENCOUNTER — Other Ambulatory Visit: Payer: Self-pay

## 2020-11-24 VITALS — BP 121/73 | HR 74 | Ht 59.75 in | Wt 179.0 lb

## 2020-11-24 DIAGNOSIS — G8929 Other chronic pain: Secondary | ICD-10-CM

## 2020-11-24 DIAGNOSIS — M1711 Unilateral primary osteoarthritis, right knee: Secondary | ICD-10-CM | POA: Diagnosis not present

## 2020-11-24 DIAGNOSIS — M1712 Unilateral primary osteoarthritis, left knee: Secondary | ICD-10-CM

## 2020-11-24 DIAGNOSIS — M48062 Spinal stenosis, lumbar region with neurogenic claudication: Secondary | ICD-10-CM | POA: Diagnosis not present

## 2020-11-24 DIAGNOSIS — M25562 Pain in left knee: Secondary | ICD-10-CM

## 2020-11-24 DIAGNOSIS — M4726 Other spondylosis with radiculopathy, lumbar region: Secondary | ICD-10-CM | POA: Diagnosis not present

## 2020-11-24 DIAGNOSIS — M4316 Spondylolisthesis, lumbar region: Secondary | ICD-10-CM | POA: Diagnosis not present

## 2020-11-24 NOTE — Patient Instructions (Signed)
Plan: Avoid bending, stooping and avoid lifting weights greater than 10 lbs. Avoid prolong standing and walking. Avoid frequent bending and stooping  No lifting greater than 10 lbs. May use ice or moist heat for pain. Weight loss is of benefit. Handicap license is approved. Proper shoes with stiff sole and arch support. Knee is suffering from osteoarthritis, only real proven treatments are Weight loss, NSIADs like diclofenac gel or tylenol, avoid motrin and naprosyn due to gastric bypass and exercise. Well padded shoes help. Ice the knee that is suffering from osteoarthritis, only real proven treatments are Ice the knee 2-3 times a day 15-20 mins at a time.-3 times a day 15-20 mins at a time. Hot showers in the AM.  Injection with steroid may be of benefit. Hemp CBD capsules, amazon.com 5,000-7,000 mg per bottle, 60 capsules per bottle, take one capsule twice a day. Cane in the left hand to use with left leg weight bearing. Follow-Up Instructions: No follow-ups on file.  Follow-Up Instructions: No follow-ups on file.

## 2020-11-24 NOTE — Telephone Encounter (Signed)
Received $25.00 money order and disability paperwork from patient    Forwarded to [facility] today

## 2020-11-24 NOTE — Progress Notes (Signed)
Office Visit Note   Patient: Cristina [name]           Date of Birth: 12-22-1960           MRN: [id number] Visit Date: 11/24/2020              Requested by: [name], MD [place],  [place] [address] PCP: [name], MD   Assessment & Plan: Visit Diagnoses: No diagnosis found.  Plan: Avoid bending, stooping and avoid lifting weights greater than 10 lbs. Avoid prolong standing and walking. Avoid frequent bending and stooping  No lifting greater than 10 lbs. May use ice or moist heat for pain. Weight loss is of benefit. Handicap license is approved. Proper shoes with stiff sole and arch support. Knee is suffering from osteoarthritis, only real proven treatments are Weight loss, NSIADs like diclofenac gel or tylenol, avoid motrin and naprosyn due to gastric bypass and exercise. Well padded shoes help. Ice the knee that is suffering from osteoarthritis, only real proven treatments are Ice the knee 2-3 times a day 15-20 mins at a time.-3 times a day 15-20 mins at a time. Hot showers in the AM.  Injection with steroid may be of benefit. Hemp CBD capsules, amazon.com 5,000-7,000 mg per bottle, 60 capsules per bottle, take one capsule twice a day. Cane in the left hand to use with left leg weight bearing. Follow-Up Instructions: No follow-ups on file.  Follow-Up Instructions: No follow-ups on file.   Orders:  No orders of the defined types were placed in this encounter.  No orders of the defined types were placed in this encounter.     Procedures: No procedures performed   Clinical Data: No additional findings.   Subjective: Chief Complaint  Patient presents with  . Lower Back - Follow-up    Has good and bad days    60 year old female with history of L4-5 spondylolisthesis she is limited in her standing and walking due to the back disorder but also has  Left knee OA and knee pain with kneeling and squatting. There is pain with grocery shopping  and walking in general, stair climbing and  First standing in the AM with pain, post sitting for a period of time. Using a stationary bike is painful for the left knee. No bowel or bladder difficulty. Has 6, 5-22, but is not a major care giver. 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   Review of Systems  Constitutional: Negative for activity change, appetite change, chills, diaphoresis, fatigue, fever and unexpected weight change.  HENT: Positive for congestion, postnasal drip, rhinorrhea, sinus pressure, sinus pain and sore throat. Negative for dental problem, drooling, ear discharge, ear pain, facial swelling, hearing loss, mouth sores, nosebleeds, sneezing, tinnitus, trouble swallowing and voice change.   Eyes: Negative.  Negative for photophobia, pain, discharge, redness, itching and visual disturbance.  Respiratory: Negative.   Cardiovascular: Negative.   Gastrointestinal: Negative.   Endocrine: Negative.   Genitourinary: Negative.   Musculoskeletal: Positive for back pain. Negative for arthralgias, gait problem, joint swelling, myalgias, neck pain and neck stiffness.  Skin: Negative.  Negative for color change, pallor and rash.  Allergic/Immunologic: Negative.  Negative for environmental allergies, food allergies and immunocompromised state.  Neurological: Positive for weakness and numbness. Negative for dizziness, tremors, seizures, syncope, facial asymmetry, speech difficulty, light-headedness and headaches.  Hematological: Negative.  Negative for adenopathy. Does not bruise/bleed easily.  Psychiatric/Behavioral: Negative.  Negative for agitation, behavioral problems, confusion,  decreased concentration, dysphoric mood, hallucinations, self-injury, sleep disturbance and suicidal ideas. The patient is not nervous/anxious and is not hyperactive.      Objective: Vital Signs: BP 121/73 (BP Location: Left Arm, Patient Position: Sitting)   Pulse 74   Ht 4' 11.75" (1.518 m)   Wt  179 lb (81.2 kg)   BMI 35.25 kg/m   Physical Exam Constitutional:      Appearance: She is well-developed and well-nourished.  HENT:     Head: Normocephalic and atraumatic.  Eyes:     Extraocular Movements: EOM normal.     Pupils: Pupils are equal, round, and reactive to light.  Pulmonary:     Effort: Pulmonary effort is normal.     Breath sounds: Normal breath sounds.  Abdominal:     General: Bowel sounds are normal.     Palpations: Abdomen is soft.  Musculoskeletal:     Cervical back: Normal range of motion and neck supple.     Lumbar back: Negative right straight leg raise test and negative left straight leg raise test.  Skin:    General: Skin is warm and dry.  Neurological:     Mental Status: She is alert and oriented to person, place, and time.  Psychiatric:        Mood and Affect: Mood and affect normal.        Behavior: Behavior normal.        Thought Content: Thought content normal.        Judgment: Judgment normal.     Back Exam   Tenderness  The patient is experiencing tenderness in the lumbar.  Range of Motion  Extension: abnormal  Flexion: normal  Lateral bend right: normal  Lateral bend left: normal  Rotation right: normal  Rotation left: normal   Muscle Strength  Right Quadriceps:  5/5  Left Quadriceps:  5/5  Right Hamstrings:  5/5  Left Hamstrings:  5/5   Tests  Straight leg raise right: negative Straight leg raise left: negative  Other  Erythema: no back redness Scars: absent  Comments:  Bilateral varus knee deformity left greater than right. OA of the left greater than right knee P-F and medial joint line.       Specialty Comments:  No specialty comments available.  Imaging: No results found.   PMFS History: Patient Active Problem List   Diagnosis Date Noted  . Morbid obesity with body mass index (BMI) of 45.0 to 49.9 in adult [facility]) 07/06/2018  . Type 2 diabetes mellitus without complication, without long-term current use  of insulin ([place]) 05/22/2017  . Class 3 obesity with serious comorbidity and body mass index (BMI) of 40.0 to 44.9 in adult 05/21/2017  . Family history of colon cancer 04/01/2017  . Irritable bowel syndrome with diarrhea 04/01/2017  . NSAID long-term use 04/01/2017  . Lymphocytosis 02/24/2017  . Essential hypertension 02/12/2017  . Mild intermittent asthma with acute exacerbation 12/12/2016  . Left shoulder pain 06/02/2015  . Insomnia 05/12/2014  . Fibromyalgia 05/12/2014  . Allergic rhinitis 03/28/2014  . Osteoarthritis 12/20/2013  . Cervical strain 06/21/2013  . Low back pain 06/21/2013  . Metatarsalgia of both feet 03/11/2013  . Sciatica 01/25/2013  . Restless leg syndrome 11/24/2012  . Pure hypercholesterolemia 10/20/2012  . OSA on CPAP 10/20/2012  . Depression 10/20/2012  . Iron deficiency anemia, unspecified 10/01/2012  . Snoring 10/01/2012  . Diabetes mellitus, type 2 ([place]) 12/31/2010  . ANAL FISSURE 12/31/2010  . Morbid obesity ([place]) 01/08/2007  .  CONSTIPATION 01/08/2007   Past Medical History:  Diagnosis Date  . Allergic rhinitis, cause unspecified   . Anemia   . Arthritis   . Asthma   . Back pain   . Chest pain   . Constipation   . CTS (carpal tunnel syndrome)   . Cystocele   . Depression   . Diabetes mellitus   . Dysfunction of eustachian tube   . Fatty liver   . Fissure, anal   . Gallbladder problem   . Genital herpes   . GERD (gastroesophageal reflux disease)   . Hemorrhoid   . Hx of migraine headaches   . Hyperlipidemia   . Hypertension   . IBS (irritable bowel syndrome)   . Insomnia   . Iron deficiency anemia, unspecified   . Joint pain   . Nausea   . Obesity   . OSA on CPAP   . Osteoarthritis   . Rectocele   . Sleep apnea   . TMJ (dislocation of temporomandibular joint)     Family History  Problem Relation Age of Onset  . Hypertension Mother 67  . Osteoarthritis Mother   . Diabetes Mother   . Colon cancer Mother 65  . Cancer Mother  18       colon cancer  . Hyperlipidemia Mother   . Obesity Mother   . Sudden death Mother   . Arthritis Mother   . Colon cancer Paternal Grandmother   . Cancer Paternal Grandmother        colon cancer  . Stroke Maternal Grandmother   . Colon cancer Maternal Grandmother   . Colon polyps Brother        x 2 brother  . Diabetes Daughter   . Hypertension Daughter   . Sleep apnea Daughter   . Mental illness Daughter        depression  . Migraines Daughter   . Cirrhosis Brother        alcoholism  . Alcohol abuse Brother   . Migraines Daughter   . Mental illness Daughter        anxiety attacks  . Protein S deficiency Daughter   . Sleep apnea Brother   . Hyperlipidemia Brother   . Migraines Brother   . Obstructive Sleep Apnea Brother   . Colon cancer Paternal Aunt     Past Surgical History:  Procedure Laterality Date  . ABDOMINAL HYSTERECTOMY  11/11/2010   [place] Plan.  Ovaries intact.  Uterine fibroids with DUB.  [name] CARPAL TUNNEL RELEASE  1989   Left  . CHOLECYSTECTOMY    . CHOLECYSTECTOMY    . COLONOSCOPY  06/29/2012   normal.  Repeat in 5 years.  [name] [place]  . ESOPHAGOGASTRODUODENOSCOPY  12/13/2011   dysphagia.  Henrene Pastor.  Normal.  . 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 Sleep study  09/11/2012   severe OSA.  CPAP titration at 12 cm water pressure.  . TUBAL LIGATION  1988   Social History   Occupational History  . 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 Smoking status: Former Research scientist (life sciences)  . Smokeless tobacco: Never Used  . Tobacco comment: smoked occasionally longest 6 mos.  Vaping Use  . Vaping Use: Never used  Substance and Sexual Activity  . Alcohol  use: No    Alcohol/week: 1.0 standard drink    Types: 1 Glasses of  wine per week  . Drug use: No  . Sexual activity: Never    Birth control/protection: None

## 2020-11-30 MED FILL — FLUTICASONE PROP 50 MCG SPR: 50 | 90 days supply | Qty: 48 | Fill #1

## 2020-12-18 ENCOUNTER — Telehealth: Payer: Self-pay

## 2020-12-18 DIAGNOSIS — H0102A Squamous blepharitis right eye, upper and lower eyelids: Secondary | ICD-10-CM | POA: Diagnosis not present

## 2020-12-18 DIAGNOSIS — H0102B Squamous blepharitis left eye, upper and lower eyelids: Secondary | ICD-10-CM | POA: Diagnosis not present

## 2020-12-18 DIAGNOSIS — H538 Other visual disturbances: Secondary | ICD-10-CM | POA: Diagnosis not present

## 2020-12-18 DIAGNOSIS — E119 Type 2 diabetes mellitus without complications: Secondary | ICD-10-CM | POA: Diagnosis not present

## 2020-12-18 NOTE — Telephone Encounter (Signed)
Patient called inquiring about scheduling TKA with Dr. Louanne Skye.  According to office note from July 2021, Dr. Louanne Skye was referring patient to Dr. Marlou Sa for this.  Please advise.  Thanks!

## 2020-12-21 ENCOUNTER — Other Ambulatory Visit: Payer: Self-pay

## 2020-12-21 ENCOUNTER — Ambulatory Visit: Payer: 59 | Admitting: Physician Assistant

## 2020-12-21 ENCOUNTER — Encounter: Payer: Self-pay | Admitting: Specialist

## 2020-12-21 VITALS — BP 116/74 | HR 82 | Ht 60.0 in | Wt 180.0 lb

## 2020-12-21 DIAGNOSIS — M1712 Unilateral primary osteoarthritis, left knee: Secondary | ICD-10-CM | POA: Insufficient documentation

## 2020-12-21 NOTE — Progress Notes (Signed)
Office Visit Note   Patient: Cristina [name]           Date of Birth: Jan 08, 1961           MRN: [id number] Visit Date: 12/21/2020              Requested by: [name], MD [place],  [place] [id number] PCP: [name], MD   Assessment & Plan: Visit Diagnoses:  1. Unilateral primary osteoarthritis, left knee     Plan: Discussed with patient at length total knee arthroplasty surgery.  Risk benefits discussed with patient.  Postoperative protocol discussed with patient.  Questions were encouraged and answered.  She would like to proceed with surgery sometime in late June or early July.  We will have her follow-up with Dr. Ninfa Linden in May to schedule surgery and answer any questions she might have at that time.  She was encouraged to work on quad strengthening in the interim and keep her diabetes under good control.  Follow-Up Instructions: Return in about 3 months (around 03/20/2021), or Dr. Ninfa Linden.   Orders:  No orders of the defined types were placed in this encounter.  No orders of the defined types were placed in this encounter.     Procedures: No procedures performed   Clinical Data: No additional findings.   Subjective: Chief Complaint  Patient presents with  . Left Knee - Follow-up    Here to discuss surgery    HPI Cristina [name] is 60 year old female who was seen Dr. Elsworth Soho hip today however he brought her over to our clinic to discuss total knee surgery.  She is having significant pain in her left knee despite conservative treatment weight loss.  She underwent gastric bypass approximately 2 years ago.  She is diabetic.  Last hemoglobin A1c is 6.5 in September 2021.  She has had no new injury to the left knee. Patient is having significant left knee pain that is affecting her quality of life and daily living.  She wears a knee brace on the left knee daily.  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 Left knee radiographs 3  views dated 10/26/2019 are reviewed.  These show tricompartmental arthritic changes with bone-on-bone medial compartment and severe patellofemoral changes.  There is mild to moderate changes of the lateral compartment with osteophytes off the margins.  No acute fractures no bony abnormalities otherwise.  Knee is well located.  Review of Systems Denies any fevers chills shortness of breath or ongoing infection.  Objective: Vital Signs: BP 116/74 (BP Location: Left Arm, Patient Position: Sitting)   Pulse 82   Ht 5' (1.524 m)   Wt 180 lb (81.6 kg)   BMI 35.15 kg/m   Physical Exam Constitutional:      Appearance: She is not ill-appearing or diaphoretic.  Pulmonary:     Effort: Pulmonary effort is normal.  Neurological:     Mental Status: She is alert and oriented to person, place, and time.  Psychiatric:        Mood and Affect: Mood normal.     Ortho Exam Left knee she lacks full extension by approximately 5 degrees.  She has full flexion of the knee.  Patellofemoral crepitus.  No instability valgus varus stressing.  No abnormal warmth erythema or effusion.  Left calf supple nontender. Specialty Comments:  No specialty comments available.  Imaging: No results found.   PMFS History: Patient Active Problem List   Diagnosis  Date Noted  . Unilateral primary osteoarthritis, left knee 12/21/2020  . Morbid obesity with body mass index (BMI) of 45.0 to 49.9 in adult [facility]) 07/06/2018  . Type 2 diabetes mellitus without complication, without long-term current use of insulin ([place]) 05/22/2017  . Class 3 obesity with serious comorbidity and body mass index (BMI) of 40.0 to 44.9 in adult 05/21/2017  . Family history of colon cancer 04/01/2017  . Irritable bowel syndrome with diarrhea 04/01/2017  . NSAID long-term use 04/01/2017  . Lymphocytosis 02/24/2017  . Essential hypertension 02/12/2017  . Mild intermittent asthma with acute exacerbation 12/12/2016  . Left shoulder pain 06/02/2015   . Insomnia 05/12/2014  . Fibromyalgia 05/12/2014  . Allergic rhinitis 03/28/2014  . Osteoarthritis 12/20/2013  . Cervical strain 06/21/2013  . Low back pain 06/21/2013  . Metatarsalgia of both feet 03/11/2013  . Sciatica 01/25/2013  . Restless leg syndrome 11/24/2012  . Pure hypercholesterolemia 10/20/2012  . OSA on CPAP 10/20/2012  . Depression 10/20/2012  . Iron deficiency anemia, unspecified 10/01/2012  . Snoring 10/01/2012  . Diabetes mellitus, type 2 ([place]) 12/31/2010  . ANAL FISSURE 12/31/2010  . Morbid obesity ([place]) 01/08/2007  . CONSTIPATION 01/08/2007   Past Medical History:  Diagnosis Date  . Allergic rhinitis, cause unspecified   . Anemia   . Arthritis   . Asthma   . Back pain   . Chest pain   . Constipation   . CTS (carpal tunnel syndrome)   . Cystocele   . Depression   . Diabetes mellitus   . Dysfunction of eustachian tube   . Fatty liver   . Fissure, anal   . Gallbladder problem   . Genital herpes   . GERD (gastroesophageal reflux disease)   . Hemorrhoid   . Hx of migraine headaches   . Hyperlipidemia   . Hypertension   . IBS (irritable bowel syndrome)   . Insomnia   . Iron deficiency anemia, unspecified   . Joint pain   . Nausea   . Obesity   . OSA on CPAP   . Osteoarthritis   . Rectocele   . Sleep apnea   . TMJ (dislocation of temporomandibular joint)     Family History  Problem Relation Age of Onset  . Hypertension Mother 71  . Osteoarthritis Mother   . Diabetes Mother   . Colon cancer Mother 36  . Cancer Mother 56       colon cancer  . Hyperlipidemia Mother   . Obesity Mother   . Sudden death Mother   . Arthritis Mother   . Colon cancer Paternal Grandmother   . Cancer Paternal Grandmother        colon cancer  . Stroke Maternal Grandmother   . Colon cancer Maternal Grandmother   . Colon polyps Brother        x 2 brother  . Diabetes Daughter   . Hypertension Daughter   . Sleep apnea Daughter   . Mental illness Daughter         depression  . Migraines Daughter   . Cirrhosis Brother        alcoholism  . Alcohol abuse Brother   . Migraines Daughter   . Mental illness Daughter        anxiety attacks  . Protein S deficiency Daughter   . Sleep apnea Brother   . Hyperlipidemia Brother   . Migraines Brother   . Obstructive Sleep Apnea Brother   . Colon cancer Paternal Aunt  Past Surgical History:  Procedure Laterality Date  . ABDOMINAL HYSTERECTOMY  11/11/2010   [place] Plan.  Ovaries intact.  Uterine fibroids with DUB.  [name] CARPAL TUNNEL RELEASE  1989   Left  . CHOLECYSTECTOMY    . CHOLECYSTECTOMY    . COLONOSCOPY  06/29/2012   normal.  Repeat in 5 years.  [name] [place]  . ESOPHAGOGASTRODUODENOSCOPY  12/13/2011   dysphagia.  Henrene Pastor.  Normal.  . 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 Sleep study  09/11/2012   severe OSA.  CPAP titration at 12 cm water pressure.  . TUBAL LIGATION  1988   Social History   Occupational History  . Occupation: Audiological scientist: [place]    Comment: [employer]  Tobacco Use  . Smoking status: Former Research scientist (life sciences)  . Smokeless tobacco: Never Used  . Tobacco comment: smoked occasionally longest 6 mos.  Vaping Use  . Vaping Use: Never used  Substance and Sexual Activity  . Alcohol use: No    Alcohol/week: 1.0 standard drink    Types: 1 Glasses of wine per week  . Drug use: No  . Sexual activity: Never    Birth control/protection: None

## 2020-12-21 NOTE — Telephone Encounter (Signed)
He had her see Dr. Ninfa Linden today

## 2021-01-11 ENCOUNTER — Ambulatory Visit: Payer: 59 | Admitting: Surgery

## 2021-01-11 ENCOUNTER — Telehealth: Payer: Self-pay | Admitting: Radiology

## 2021-01-11 NOTE — Telephone Encounter (Signed)
Patient called office this morning and made work in appointment with Jeneen Rinks for left knee pain. After Jeneen Rinks reviewed chart, it was determined that patient had seen Dr. Louanne Skye previously, however, he referred to Dr. Ninfa Linden for left total knee arthroplasty.  Jeneen Rinks had me call patient to see if this was a new problem/occurrance or if this is for continued knee pain. He does not feel comfortable giving patient injection if she is pending surgery with Dr. Ninfa Linden. Patient states that this is not acute and she has not had an injury. She feels that the knee is just feeling worse and she occasionally is getting sharp pains in the back of the knee. She states that this is not constant. She denies any pain in her calf and denies swelling. She asked me if she could just schedule surgery sooner.  I spoke with Artis Delay who advised that patient requested waiting until the summer to have surgery. This is why her follow up appointment was scheduled with Dr. Ninfa Linden in May. He states that Jeneen Rinks could see patient to be sure it is not an acute problem and if patient wants to schedule surgery sooner, she can make sooner appointment with Dr. Ninfa Linden.  I called patient back. She states that this is not anything acute and the pain is just getting worse. I explained that the reason we were waiting to schedule surgery is because that was what she had requested. She states that this is when she would have help at home as the kids will be out of school. I did explain that she would need someone with her post operatively and that it is up to her when she decides to schedule as this is an elective surgery. She said to cancel the appointment today, had me schedule an earlier appointment with Dr. Ninfa Linden (01/31/2021 at 0830), and is going to work on seeing if someone may be able to help her out if she is able to schedule sooner.

## 2021-01-17 ENCOUNTER — Encounter (HOSPITAL_COMMUNITY): Payer: Self-pay | Admitting: *Deleted

## 2021-01-31 ENCOUNTER — Ambulatory Visit: Payer: 59 | Admitting: Orthopaedic Surgery

## 2021-02-05 ENCOUNTER — Other Ambulatory Visit: Payer: Self-pay | Admitting: Family Medicine

## 2021-02-05 DIAGNOSIS — Z1231 Encounter for screening mammogram for malignant neoplasm of breast: Secondary | ICD-10-CM

## 2021-02-20 ENCOUNTER — Other Ambulatory Visit (HOSPITAL_COMMUNITY): Payer: Self-pay

## 2021-02-20 MED ORDER — ACYCLOVIR 400 MG PO TABS
400.0000 mg | ORAL_TABLET | Freq: Two times a day (BID) | ORAL | 3 refills | Status: DC
  Filled 2021-02-20: qty 180, 90d supply, fill #0
  Filled 2021-05-21: qty 180, 90d supply, fill #1

## 2021-02-20 MED ORDER — DICLOFENAC SODIUM 1 % EX GEL
Freq: Four times a day (QID) | CUTANEOUS | 0 refills | Status: DC
  Filled 2021-02-20: qty 400, 20d supply, fill #0

## 2021-02-20 MED ORDER — GABAPENTIN 300 MG PO CAPS
300.0000 mg | ORAL_CAPSULE | Freq: Two times a day (BID) | ORAL | 3 refills | Status: DC
  Filled 2021-02-20: qty 180, 90d supply, fill #0

## 2021-02-20 MED ORDER — BUPROPION HCL 100 MG PO TABS
100.0000 mg | ORAL_TABLET | Freq: Every day | ORAL | 3 refills | Status: DC
  Filled 2021-02-20: qty 90, 90d supply, fill #0
  Filled 2021-05-21: qty 90, 90d supply, fill #1

## 2021-02-20 MED FILL — Fluticasone Propionate Nasal Susp 50 MCG/ACT: NASAL | 90 days supply | Qty: 48 | Fill #0 | Status: AC

## 2021-02-20 MED FILL — Escitalopram Oxalate Tab 10 MG (Base Equiv): ORAL | 90 days supply | Qty: 90 | Fill #0 | Status: AC

## 2021-02-20 MED FILL — Pantoprazole Sodium EC Tab 40 MG (Base Equiv): ORAL | 90 days supply | Qty: 180 | Fill #0 | Status: AC

## 2021-02-20 MED FILL — Atorvastatin Calcium Tab 20 MG (Base Equivalent): ORAL | 90 days supply | Qty: 90 | Fill #0 | Status: AC

## 2021-02-20 MED FILL — Empagliflozin Tab 25 MG: ORAL | 90 days supply | Qty: 90 | Fill #0 | Status: AC

## 2021-02-27 ENCOUNTER — Other Ambulatory Visit (HOSPITAL_COMMUNITY): Payer: Self-pay

## 2021-02-27 DIAGNOSIS — E119 Type 2 diabetes mellitus without complications: Secondary | ICD-10-CM | POA: Diagnosis not present

## 2021-02-27 DIAGNOSIS — M170 Bilateral primary osteoarthritis of knee: Secondary | ICD-10-CM | POA: Diagnosis not present

## 2021-02-27 DIAGNOSIS — R1012 Left upper quadrant pain: Secondary | ICD-10-CM | POA: Diagnosis not present

## 2021-02-27 DIAGNOSIS — K219 Gastro-esophageal reflux disease without esophagitis: Secondary | ICD-10-CM | POA: Diagnosis not present

## 2021-02-27 MED ORDER — ATORVASTATIN CALCIUM 20 MG PO TABS
20.0000 mg | ORAL_TABLET | Freq: Every day | ORAL | 4 refills | Status: DC
  Filled 2021-02-27 – 2021-12-06 (×2): qty 90, 90d supply, fill #0

## 2021-02-27 MED ORDER — FLUTICASONE PROPIONATE 50 MCG/ACT NA SUSP
2.0000 | Freq: Every day | NASAL | 4 refills | Status: DC
  Filled 2021-02-27 – 2021-05-21 (×2): qty 48, 90d supply, fill #0
  Filled 2021-12-06: qty 48, 90d supply, fill #1

## 2021-02-27 MED ORDER — BUPROPION HCL 100 MG PO TABS
100.0000 mg | ORAL_TABLET | Freq: Every day | ORAL | 4 refills | Status: DC
  Filled 2021-02-27 – 2021-10-25 (×2): qty 90, 90d supply, fill #0
  Filled 2021-12-06 – 2022-01-01 (×2): qty 90, 90d supply, fill #1

## 2021-02-27 MED ORDER — METFORMIN HCL 500 MG PO TABS
500.0000 mg | ORAL_TABLET | Freq: Every day | ORAL | 3 refills | Status: DC
  Filled 2021-02-27: qty 90, 90d supply, fill #0
  Filled 2021-05-21: qty 90, 90d supply, fill #1
  Filled 2021-08-21: qty 90, 90d supply, fill #2

## 2021-02-27 MED ORDER — JARDIANCE 25 MG PO TABS
25.0000 mg | ORAL_TABLET | Freq: Every day | ORAL | 4 refills | Status: DC
  Filled 2021-02-27 – 2021-08-21 (×2): qty 90, 90d supply, fill #0
  Filled 2021-12-06: qty 90, 90d supply, fill #1

## 2021-02-27 MED ORDER — AZELASTINE HCL 0.05 % OP SOLN
1.0000 [drp] | Freq: Two times a day (BID) | OPHTHALMIC | 4 refills | Status: DC | PRN
  Filled 2021-02-27: qty 6, 30d supply, fill #0

## 2021-02-27 MED ORDER — PANTOPRAZOLE SODIUM 40 MG PO TBEC
1.0000 | DELAYED_RELEASE_TABLET | Freq: Two times a day (BID) | ORAL | 4 refills | Status: DC
  Filled 2021-02-27: qty 180, 90d supply, fill #0

## 2021-02-27 MED ORDER — CETIRIZINE HCL 10 MG PO TABS
10.0000 mg | ORAL_TABLET | Freq: Every day | ORAL | 4 refills | Status: DC
  Filled 2021-02-27: qty 90, 90d supply, fill #0

## 2021-02-27 MED ORDER — GABAPENTIN 300 MG PO CAPS
1.0000 | ORAL_CAPSULE | Freq: Two times a day (BID) | ORAL | 4 refills | Status: DC
  Filled 2021-02-27 – 2021-05-21 (×2): qty 180, 90d supply, fill #0
  Filled 2021-08-21: qty 180, 90d supply, fill #1
  Filled 2021-12-06: qty 180, 90d supply, fill #2

## 2021-02-27 MED ORDER — ACYCLOVIR 400 MG PO TABS
400.0000 mg | ORAL_TABLET | Freq: Two times a day (BID) | ORAL | 4 refills | Status: DC
  Filled 2021-02-27 – 2021-09-11 (×2): qty 180, 90d supply, fill #0
  Filled 2021-12-06: qty 180, 90d supply, fill #1

## 2021-02-28 ENCOUNTER — Other Ambulatory Visit (HOSPITAL_COMMUNITY): Payer: Self-pay

## 2021-02-28 DIAGNOSIS — R1012 Left upper quadrant pain: Secondary | ICD-10-CM | POA: Diagnosis not present

## 2021-02-28 DIAGNOSIS — E119 Type 2 diabetes mellitus without complications: Secondary | ICD-10-CM | POA: Diagnosis not present

## 2021-02-28 DIAGNOSIS — E7800 Pure hypercholesterolemia, unspecified: Secondary | ICD-10-CM | POA: Diagnosis not present

## 2021-03-20 ENCOUNTER — Encounter: Payer: Self-pay | Admitting: Orthopaedic Surgery

## 2021-03-20 ENCOUNTER — Ambulatory Visit (INDEPENDENT_AMBULATORY_CARE_PROVIDER_SITE_OTHER): Payer: 59 | Admitting: Orthopaedic Surgery

## 2021-03-20 VITALS — Ht 60.0 in | Wt 181.8 lb

## 2021-03-20 DIAGNOSIS — M25562 Pain in left knee: Secondary | ICD-10-CM | POA: Diagnosis not present

## 2021-03-20 DIAGNOSIS — G8929 Other chronic pain: Secondary | ICD-10-CM | POA: Diagnosis not present

## 2021-03-20 DIAGNOSIS — M1712 Unilateral primary osteoarthritis, left knee: Secondary | ICD-10-CM | POA: Diagnosis not present

## 2021-03-20 NOTE — Progress Notes (Signed)
Office Visit Note   Patient: Cristina [name]           Date of Birth: 02-Feb-1961           MRN: [id number] Visit Date: 03/20/2021              Requested by: [name], MD [place],  [place] [id number] PCP: [name], MD   Assessment & Plan: Visit Diagnoses:  1. Unilateral primary osteoarthritis, left knee   2. Chronic pain of left knee     Plan: At this point she is ready to proceed with knee replacement surgery.  I counseled her again about good blood glucose control.  I showed her knee model and her x-rays and went over in detail what knee replacement surgery involves.  We discussed the risk and benefits of the surgery and what to expect with an intraoperative and postoperative course.  All question concerns were answered and addressed.  We will work on getting this scheduled for late June when she does have family available to help her out.  Follow-Up Instructions: Return for 2 weeks post-op.   Orders:  No orders of the defined types were placed in this encounter.  No orders of the defined types were placed in this encounter.     Procedures: No procedures performed   Clinical Data: No additional findings.   Subjective: No chief complaint on file. The patient is a 60 year old female well-known to [name].  She has known end-stage arthritis with varus malalignment of the left knee.  This is been well-documented with previous x-rays.  She has had knee pain for many years now.  At this point is definitely affecting her mobility, her quality of life and actives daily living.  She has had steroid injections as well.  She is worked on weight loss and activity modification.  She tried quad strengthening exercises and has had multiple injections in her knee.  When we talked to her last and saw her in February, we had recommended knee replacement surgery and she was thinking about having this done in June or July.  She comes today to discuss the surgery again and  to hopefully definitively get it scheduled.  She denies any acute change in her medical status.  Her last hemoglobin A1c has gone up but it is 7.3.  I counseled her about this.  She has been treated conservatively for her left knee for at least 4 years.  HPI  Review of Systems She currently denies any headache, chest pain, shortness of breath, fever, chills, nausea, vomiting  Objective: Vital Signs: There were no vitals taken for this visit.  Physical Exam She is alert and orient x3 and in no acute distress Ortho Exam Examination of her left knee shows varus malalignment that is correctable.  There is significant patellofemoral crepitation and medial joint line tenderness.  The knee is ligamentously stable good range of motion. Specialty Comments:  No specialty comments available.  Imaging: No results found. Previous x-rays of the left knee show significant varus malalignment with bone-on-bone wear of the medial compartment.  There is complete loss of medial joint space.  There is severe patellofemoral arthritic changes and osteophytes in all 3 compartments.  PMFS History: Patient Active Problem List   Diagnosis Date Noted  . Unilateral primary osteoarthritis, left knee 12/21/2020  . Morbid obesity with body mass index (BMI) of 45.0 to 49.9 in adult [facility]) 07/06/2018  . Type 2 diabetes mellitus without  complication, without long-term current use of insulin ([place]) 05/22/2017  . Class 3 obesity with serious comorbidity and body mass index (BMI) of 40.0 to 44.9 in adult 05/21/2017  . Family history of colon cancer 04/01/2017  . Irritable bowel syndrome with diarrhea 04/01/2017  . NSAID long-term use 04/01/2017  . Lymphocytosis 02/24/2017  . Essential hypertension 02/12/2017  . Mild intermittent asthma with acute exacerbation 12/12/2016  . Left shoulder pain 06/02/2015  . Insomnia 05/12/2014  . Fibromyalgia 05/12/2014  . Allergic rhinitis 03/28/2014  . Osteoarthritis 12/20/2013  .  Cervical strain 06/21/2013  . Low back pain 06/21/2013  . Metatarsalgia of both feet 03/11/2013  . Sciatica 01/25/2013  . Restless leg syndrome 11/24/2012  . Pure hypercholesterolemia 10/20/2012  . OSA on CPAP 10/20/2012  . Depression 10/20/2012  . Iron deficiency anemia, unspecified 10/01/2012  . Snoring 10/01/2012  . Diabetes mellitus, type 2 ([place]) 12/31/2010  . ANAL FISSURE 12/31/2010  . Morbid obesity ([place]) 01/08/2007  . CONSTIPATION 01/08/2007   Past Medical History:  Diagnosis Date  . Allergic rhinitis, cause unspecified   . Anemia   . Arthritis   . Asthma   . Back pain   . Chest pain   . Constipation   . CTS (carpal tunnel syndrome)   . Cystocele   . Depression   . Diabetes mellitus   . Dysfunction of eustachian tube   . Fatty liver   . Fissure, anal   . Gallbladder problem   . Genital herpes   . GERD (gastroesophageal reflux disease)   . Hemorrhoid   . Hx of migraine headaches   . Hyperlipidemia   . Hypertension   . IBS (irritable bowel syndrome)   . Insomnia   . Iron deficiency anemia, unspecified   . Joint pain   . Nausea   . Obesity   . OSA on CPAP   . Osteoarthritis   . Rectocele   . Sleep apnea   . TMJ (dislocation of temporomandibular joint)     Family History  Problem Relation Age of Onset  . Hypertension Mother 72  . Osteoarthritis Mother   . Diabetes Mother   . Colon cancer Mother 6  . Cancer Mother 20       colon cancer  . Hyperlipidemia Mother   . Obesity Mother   . Sudden death Mother   . Arthritis Mother   . Colon cancer Paternal Grandmother   . Cancer Paternal Grandmother        colon cancer  . Stroke Maternal Grandmother   . Colon cancer Maternal Grandmother   . Colon polyps Brother        x 2 brother  . Diabetes Daughter   . Hypertension Daughter   . Sleep apnea Daughter   . Mental illness Daughter        depression  . Migraines Daughter   . Cirrhosis Brother        alcoholism  . Alcohol abuse Brother   . Migraines  Daughter   . Mental illness Daughter        anxiety attacks  . Protein S deficiency Daughter   . Sleep apnea Brother   . Hyperlipidemia Brother   . Migraines Brother   . Obstructive Sleep Apnea Brother   . Colon cancer Paternal Aunt     Past Surgical History:  Procedure Laterality Date  . ABDOMINAL HYSTERECTOMY  11/11/2010   [place] Plan.  Ovaries intact.  Uterine fibroids with DUB.  [name] [place]  Left  . CHOLECYSTECTOMY    . CHOLECYSTECTOMY    . COLONOSCOPY  06/29/2012   normal.  Repeat in 5 years.  [name] [place]  . ESOPHAGOGASTRODUODENOSCOPY  12/13/2011   dysphagia.  Henrene Pastor.  Normal.  . 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 Sleep study  09/11/2012   severe OSA.  CPAP titration at 12 cm water pressure.  . TUBAL LIGATION  1988   Social History   Occupational History  . 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 Smoking status: Former Research scientist (life sciences)  . Smokeless tobacco: Never Used  . Tobacco comment: smoked occasionally longest 6 mos.  Vaping Use  . Vaping Use: Never used  Substance and Sexual Activity  . Alcohol use: No    Alcohol/week: 1.0 standard drink    Types: 1 Glasses of wine per week  . Drug use: No  . Sexual activity: Never    Birth control/protection: None

## 2021-03-22 ENCOUNTER — Telehealth: Payer: Self-pay | Admitting: Orthopaedic Surgery

## 2021-03-22 NOTE — Telephone Encounter (Signed)
Received medical records release form,disability/FMLA paperwork and $25.00 money order from patient     Forwarding to [facility] today

## 2021-03-23 ENCOUNTER — Other Ambulatory Visit: Payer: Self-pay

## 2021-04-03 ENCOUNTER — Other Ambulatory Visit: Payer: Self-pay

## 2021-04-03 ENCOUNTER — Ambulatory Visit
Admission: RE | Admit: 2021-04-03 | Discharge: 2021-04-03 | Disposition: A | Discharge status: Home / Self Care | Payer: 59 | Source: Ambulatory Visit | Attending: Family Medicine | Admitting: Family Medicine

## 2021-04-03 ENCOUNTER — Ambulatory Visit: Payer: 59

## 2021-04-03 DIAGNOSIS — Z1231 Encounter for screening mammogram for malignant neoplasm of breast: Secondary | ICD-10-CM

## 2021-04-06 ENCOUNTER — Other Ambulatory Visit (HOSPITAL_COMMUNITY): Payer: Self-pay

## 2021-04-06 MED ORDER — CARESTART COVID-19 HOME TEST VI KIT
PACK | 0 refills | Status: DC
  Filled 2021-04-06: qty 4, 4d supply, fill #0

## 2021-04-20 NOTE — Progress Notes (Addendum)
COVID Vaccine Completed: Yes x3 Date COVID Vaccine completed: 11/13/19, 12/03/19, 10/31/20 Has received booster: Yes  COVID vaccine manufacturer: [employer]     Date of COVID positive in last 77 days:N/A  PCP - Reginia Forts, MD Cardiologist - [place].  Pt saw Dorris Carnes 06/09/20  Chest x-ray - N/A EKG - 06/09/20 Epic Stress Test - 11/13/16 Epic ECHO - 11/08/16 Epic Cardiac Cath - N/A Pacemaker/ICD device last checked: Spinal Cord Stimulator:  Sleep Study - 06/29/19 Epic negative sleep apnea CPAP -   Fasting Blood Sugar - 120-126 Checks Blood Sugar 1 times a day 275 at PAT  Blood Thinner Instructions: N/A Aspirin Instructions: Last Dose:  Activity level:  Can go up a flight of stairs and perform activities of daily living without stopping and without symptoms of chest pain or shortness of breath. Pt reports hx of chest palpitations and CP, pt states this was related to caffeine and has not had symptoms since giving up caffeine.     Anesthesia review:  Hx of chest pain.  DM, HTN  Patient denies shortness of breath, fever, cough and chest pain at PAT appointment   Patient verbalized understanding of instructions that were given to them at the PAT appointment. Patient was also instructed that they will need to review over the PAT instructions again at home before surgery.

## 2021-04-20 NOTE — Progress Notes (Signed)
Please put in order for PAT visit scheduled 04/27/21.

## 2021-04-20 NOTE — Patient Instructions (Addendum)
DUE TO COVID-19 ONLY ONE VISITOR IS ALLOWED TO COME WITH YOU AND STAY IN THE WAITING ROOM ONLY DURING PRE OP AND PROCEDURE.   **NO VISITORS ARE ALLOWED IN THE SHORT STAY AREA OR RECOVERY ROOM!!**  IF YOU WILL BE ADMITTED INTO THE HOSPITAL YOU ARE ALLOWED ONLY TWO SUPPORT PEOPLE DURING VISITATION HOURS ONLY (10AM -8PM)   The support person(s) may change daily. The support person(s) must pass our screening, gel in and out, and wear a mask at all times, including in the patient's room. Patients must also wear a mask when staff or their support person are in the room.  No visitors under the age of 57. Any visitor under the age of 65 must be accompanied by an adult.    COVID SWAB TESTING MUST BE COMPLETED ON:  05/02/21 @ 3:00 PM   [address] [place], [place] [address]   You are not required to quarantine, however you are required to wear a well-fitted mask when you are out and around people not in your household.  Hand Hygiene often Do NOT share personal items Notify your provider if you are in close contact with someone who has COVID or you develop fever 100.4 or greater, new onset of sneezing, cough, sore throat, shortness of breath or body aches.        Your procedure is scheduled on: 05/04/21   Report to [facility] Main  Entrance    Report to admitting at 8:50 AM   Call this number if you have problems the morning of surgery [phone]   Do not eat food :After Midnight.   May have liquids until 8:20 AM day of surgery  CLEAR LIQUID DIET  Foods Allowed                                                                     Foods Excluded  Water, Black Coffee and tea, regular and decaf               liquids that you cannot  Plain Jell-O in any flavor  (No red)                                     see through such as: Fruit ices (not with fruit pulp)                                             milk, soups, orange juice              Iced Popsicles (No red)                                                  All solid food                                   Apple juices  Sports drinks like Gatorade (No red) Lightly seasoned clear broth or consume(fat free) Sugar, honey syrup     Complete one Ensure drink the morning of surgery at 8:20 AM the day of surgery.       The day of surgery:  Drink ONE (1) Pre-Surgery Clear Ensure or G2 by am the morning of surgery. Drink in one sitting. Do not sip.  This drink was given to you during your hospital  pre-op appointment visit. Nothing else to drink after completing the  Pre-Surgery Clear Ensure or G2.          If you have questions, please contact your surgeon's office.     Oral Hygiene is also important to reduce your risk of infection.                                    Remember - BRUSH YOUR TEETH THE MORNING OF SURGERY WITH YOUR REGULAR TOOTHPASTE  Take these medicines the morning of surgery with A SIP OF WATER: Acyclovir, Bupropion, Escitalompram, Gabapentin, Pantoprazole, Atorvastatin,  Cetirizine  DO NOT TAKE ANY ORAL DIABETIC MEDICATIONS DAY OF YOUR SURGERY  How to Manage Your Diabetes Before and After Surgery  Why is it important to control my blood sugar before and after surgery? Improving blood sugar levels before and after surgery helps healing and can limit problems. A way of improving blood sugar control is eating a healthy diet by:  Eating less sugar and carbohydrates  Increasing activity/exercise  Talking with your doctor about reaching your blood sugar goals High blood sugars (greater than 180 mg/dL) can raise your risk of infections and slow your recovery, so you will need to focus on controlling your diabetes during the weeks before surgery. Make sure that the doctor who takes care of your diabetes knows about your planned surgery including the date and location.  How do I manage my blood sugar before surgery? Check your blood sugar at least 4 times a day, starting 2 days before surgery, to make  sure that the level is not too high or low. Check your blood sugar the morning of your surgery when you wake up and every 2 hours until you get to the Short Stay unit. If your blood sugar is less than 70 mg/dL, you will need to treat for low blood sugar: Do not take insulin. Treat a low blood sugar (less than 70 mg/dL) with  cup of clear juice (cranberry or apple), 4 glucose tablets, OR glucose gel. Recheck blood sugar in 15 minutes after treatment (to make sure it is greater than 70 mg/dL). If your blood sugar is not greater than 70 mg/dL on recheck, call [phone] for further instructions. Report your blood sugar to the short stay nurse when you get to Short Stay.  If you are admitted to the hospital after surgery: Your blood sugar will be checked by the staff and you will probably be given insulin after surgery (instead of oral diabetes medicines) to make sure you have good blood sugar levels. The goal for blood sugar control after surgery is 80-180 mg/dL.   WHAT DO I DO ABOUT MY DIABETES MEDICATION?  Do not take oral diabetes medicines (pills) the morning of surgery.  THE DAY BEFORE SURGERY, take Metformin as prescribed. Do not take Empagliflozin (Jardiance).        THE MORNING OF SURGERY, do not take Metformin or Emagliflozin (Jardiance).  Reviewed and Endorsed by [facility] Patient Education Committee, August 2015                               You may not have any metal on your body including hair pins, jewelry, and body piercing             Do not wear make-up, lotions, powders, perfumes, or deodorant  Do not wear nail polish including gel and S&S, artificial/acrylic nails, or any other type of covering on natural nails including finger and toenails. If you have artificial nails, gel coating, etc. that needs to be removed by a nail salon please have this removed prior to surgery or surgery may need to be canceled/ delayed if the surgeon/ anesthesia feels like they are  unable to be safely monitored.   Do not shave  48 hours prior to surgery.    Do not bring valuables to the hospital. [place].   Contacts, dentures or bridgework may not be worn into surgery.   Bring small overnight bag day of surgery.  Special Instructions: Bring a copy of your healthcare power of attorney and living will documents         the day of surgery if you haven't scanned them In before.              Please read over the following fact sheets you were given: IF YOU HAVE QUESTIONS ABOUT YOUR PRE OP INSTRUCTIONS PLEASE CALL [phone]   [facility] - Preparing for Surgery Before surgery, you can play an important role.  Because skin is not sterile, your skin needs to be as free of germs as possible.  You can reduce the number of germs on your skin by washing with CHG (chlorahexidine gluconate) soap before surgery.  CHG is an antiseptic cleaner which kills germs and bonds with the skin to continue killing germs even after washing. Please DO NOT use if you have an allergy to CHG or antibacterial soaps.  If your skin becomes reddened/irritated stop using the CHG and inform your nurse when you arrive at Short Stay. Do not shave (including legs and underarms) for at least 48 hours prior to the first CHG shower.  You may shave your face/neck.  Please follow these instructions carefully:  1.  Shower with CHG Soap the night before surgery and the  morning of surgery.  2.  If you choose to wash your hair, wash your hair first as usual with your normal  shampoo.  3.  After you shampoo, rinse your hair and body thoroughly to remove the shampoo.                             4.  Use CHG as you would any other liquid soap.  You can apply chg directly to the skin and wash.  Gently with a scrungie or clean washcloth.  5.  Apply the CHG Soap to your body ONLY FROM THE NECK DOWN.   Do   not use on face/ open                           Wound or open  sores. Avoid contact with eyes, ears mouth and   genitals (private parts).  Wash face,  Genitals (private parts) with your normal soap.             6.  Wash thoroughly, paying special attention to the area where your    surgery  will be performed.  7.  Thoroughly rinse your body with warm water from the neck down.  8.  DO NOT shower/wash with your normal soap after using and rinsing off the CHG Soap.                9.  Pat yourself dry with a clean towel.            10.  Wear clean pajamas.            11.  Place clean sheets on your bed the night of your first shower and do not  sleep with pets. Day of Surgery : Do not apply any lotions/deodorants the morning of surgery.  Please wear clean clothes to the hospital/surgery center.  FAILURE TO FOLLOW THESE INSTRUCTIONS MAY RESULT IN THE CANCELLATION OF YOUR SURGERY  PATIENT SIGNATURE_________________________________  NURSE SIGNATURE__________________________________  ________________________________________________________________________    Adam Phenix  An incentive spirometer is a tool that can help keep your lungs clear and active. This tool measures how well you are filling your lungs with each breath. Taking long deep breaths may help reverse or decrease the chance of developing breathing (pulmonary) problems (especially infection) following: A long period of time when you are unable to move or be active. BEFORE THE PROCEDURE  If the spirometer includes an indicator to show your best effort, your nurse or respiratory therapist will set it to a desired goal. If possible, sit up straight or lean slightly forward. Try not to slouch. Hold the incentive spirometer in an upright position. INSTRUCTIONS FOR USE  Sit on the edge of your bed if possible, or sit up as far as you can in bed or on a chair. Hold the incentive spirometer in an upright position. Breathe out normally. Place the mouthpiece in your mouth  and seal your lips tightly around it. Breathe in slowly and as deeply as possible, raising the piston or the ball toward the top of the column. Hold your breath for 3-5 seconds or for as long as possible. Allow the piston or ball to fall to the bottom of the column. Remove the mouthpiece from your mouth and breathe out normally. Rest for a few seconds and repeat Steps 1 through 7 at least 10 times every 1-2 hours when you are awake. Take your time and take a few normal breaths between deep breaths. The spirometer may include an indicator to show your best effort. Use the indicator as a goal to work toward during each repetition. After each set of 10 deep breaths, practice coughing to be sure your lungs are clear. If you have an incision (the cut made at the time of surgery), support your incision when coughing by placing a pillow or rolled up towels firmly against it. Once you are able to get out of bed, walk around indoors and cough well. You may stop using the incentive spirometer when instructed by your caregiver.  RISKS AND COMPLICATIONS Take your time so you do not get dizzy or light-headed. If you are in pain, you may need to take or ask for pain medication before doing incentive spirometry. It is harder to take a deep breath if you are having pain. AFTER USE Rest and breathe slowly and easily. It can be helpful  to keep track of a log of your progress. Your caregiver can provide you with a simple table to help with this. If you are using the spirometer at home, follow these instructions: [place] IF:  You are having difficultly using the spirometer. You have trouble using the spirometer as often as instructed. Your pain medication is not giving enough relief while using the spirometer. You develop fever of 100.5 F (38.1 C) or higher. SEEK IMMEDIATE MEDICAL CARE IF:  You cough up bloody sputum that had not been present before. You develop fever of 102 F (38.9 C) or  greater. You develop worsening pain at or near the incision site. MAKE SURE YOU:  Understand these instructions. Will watch your condition. Will get help right away if you are not doing well or get worse. Document Released: 03/10/2007 Document Revised: 01/20/2012 Document Reviewed: 05/11/2007 [facility] Patient Information 2014 [place], [place].   ________________________________________________________________________

## 2021-04-23 ENCOUNTER — Other Ambulatory Visit: Payer: Self-pay | Admitting: Physician Assistant

## 2021-04-27 ENCOUNTER — Encounter (HOSPITAL_COMMUNITY): Payer: Self-pay

## 2021-04-27 ENCOUNTER — Other Ambulatory Visit: Payer: Self-pay

## 2021-04-27 ENCOUNTER — Encounter (HOSPITAL_COMMUNITY)
Admission: RE | Admit: 2021-04-27 | Discharge: 2021-04-27 | Disposition: A | Discharge status: Home / Self Care | Payer: 59 | Source: Ambulatory Visit | Attending: Orthopaedic Surgery | Admitting: Orthopaedic Surgery

## 2021-04-27 DIAGNOSIS — Z01812 Encounter for preprocedural laboratory examination: Secondary | ICD-10-CM | POA: Diagnosis not present

## 2021-04-27 HISTORY — DX: Headache, unspecified: R51.9

## 2021-04-27 LAB — BASIC METABOLIC PANEL
Anion gap: 8 (ref 5–15)
BUN: 17 mg/dL (ref 6–20)
CO2: 24 mmol/L (ref 22–32)
Calcium: 8.8 mg/dL — ABNORMAL LOW (ref 8.9–10.3)
Chloride: 107 mmol/L (ref 98–111)
Creatinine, Ser: 0.79 mg/dL (ref 0.44–1.00)
GFR, Estimated: >60 mL/min (ref >60)
Glucose, Bld: 205 mg/dL — ABNORMAL HIGH (ref 70–99)
Potassium: 3.5 mmol/L (ref 3.5–5.1)
Sodium: 139 mmol/L (ref 135–145)

## 2021-04-27 LAB — CBC
HCT: 44.7 % (ref 36.0–46.0)
Hemoglobin: 14 g/dL (ref 12.0–15.0)
MCH: 29 pg (ref 26.0–34.0)
MCHC: 31.3 g/dL (ref 30.0–36.0)
MCV: 92.5 fL (ref 80.0–100.0)
Platelets: 253 10*3/uL (ref 150–400)
RBC: 4.83 MIL/uL (ref 3.87–5.11)
RDW: 13.9 % (ref 11.5–15.5)
WBC: 9.9 10*3/uL (ref 4.0–10.5)
nRBC: 0 % (ref 0.0–0.2)

## 2021-04-27 LAB — GLUCOSE, CAPILLARY: Glucose-Capillary: 275 mg/dL — ABNORMAL HIGH (ref 70–99)

## 2021-04-27 LAB — SURGICAL PCR SCREEN
MRSA, PCR: NEGATIVE
Staphylococcus aureus: NEGATIVE

## 2021-04-28 LAB — HEMOGLOBIN A1C
Hgb A1c MFr Bld: 7.8 % — ABNORMAL HIGH (ref 4.8–5.6)
Mean Plasma Glucose: 177 mg/dL

## 2021-05-02 ENCOUNTER — Other Ambulatory Visit (HOSPITAL_COMMUNITY): Payer: 59

## 2021-05-04 LAB — TYPE AND SCREEN
ABO/RH(D): O POS
Antibody Screen: NEGATIVE

## 2021-05-07 ENCOUNTER — Telehealth: Payer: Self-pay

## 2021-05-07 NOTE — Telephone Encounter (Signed)
How long is pt going to be out of work?

## 2021-05-07 NOTE — Telephone Encounter (Signed)
Please see message from sherri

## 2021-05-07 NOTE — Telephone Encounter (Signed)
Pt called needing a new note for when she is going to be out of work for her new surgery date.  Pt said the note can be giving to [place] and faxed to [employer]. She just needs an additional letter due to the date changes according to [employer].

## 2021-05-07 NOTE — Telephone Encounter (Signed)
Yes, she is rescheduled for Thursday, 05/31/21.  We can have her A1C drawn at her 07/15 appointment with Louanne Skye.  After talking to her, we are anticipating that she she can get it down in a month.

## 2021-05-07 NOTE — Telephone Encounter (Signed)
Dr. Ninfa Linden asked if  you can please check into this because I see that you have already rescheduled

## 2021-05-16 ENCOUNTER — Encounter: Payer: 59 | Admitting: Orthopaedic Surgery

## 2021-05-21 ENCOUNTER — Other Ambulatory Visit (HOSPITAL_COMMUNITY): Payer: Self-pay

## 2021-05-21 MED FILL — Escitalopram Oxalate Tab 10 MG (Base Equiv): ORAL | 90 days supply | Qty: 90 | Fill #1 | Status: AC

## 2021-05-21 MED FILL — Atorvastatin Calcium Tab 20 MG (Base Equivalent): ORAL | 90 days supply | Qty: 90 | Fill #1 | Status: AC

## 2021-05-21 MED FILL — Empagliflozin Tab 25 MG: ORAL | 90 days supply | Qty: 90 | Fill #1 | Status: AC

## 2021-05-21 MED FILL — Pantoprazole Sodium EC Tab 40 MG (Base Equiv): ORAL | 90 days supply | Qty: 180 | Fill #1 | Status: AC

## 2021-05-25 ENCOUNTER — Ambulatory Visit (INDEPENDENT_AMBULATORY_CARE_PROVIDER_SITE_OTHER): Payer: 59 | Admitting: Specialist

## 2021-05-25 ENCOUNTER — Telehealth: Payer: Self-pay | Admitting: Orthopaedic Surgery

## 2021-05-25 ENCOUNTER — Encounter: Payer: Self-pay | Admitting: Specialist

## 2021-05-25 ENCOUNTER — Other Ambulatory Visit: Payer: Self-pay

## 2021-05-25 VITALS — BP 119/81 | HR 77 | Ht 61.0 in | Wt 181.0 lb

## 2021-05-25 DIAGNOSIS — M1712 Unilateral primary osteoarthritis, left knee: Secondary | ICD-10-CM | POA: Diagnosis not present

## 2021-05-25 DIAGNOSIS — M25562 Pain in left knee: Secondary | ICD-10-CM

## 2021-05-25 DIAGNOSIS — M4726 Other spondylosis with radiculopathy, lumbar region: Secondary | ICD-10-CM | POA: Diagnosis not present

## 2021-05-25 DIAGNOSIS — M48062 Spinal stenosis, lumbar region with neurogenic claudication: Secondary | ICD-10-CM

## 2021-05-25 DIAGNOSIS — E119 Type 2 diabetes mellitus without complications: Secondary | ICD-10-CM

## 2021-05-25 DIAGNOSIS — M4316 Spondylolisthesis, lumbar region: Secondary | ICD-10-CM | POA: Diagnosis not present

## 2021-05-25 DIAGNOSIS — G8929 Other chronic pain: Secondary | ICD-10-CM | POA: Diagnosis not present

## 2021-05-25 NOTE — Patient Instructions (Signed)
Plan: Avoid frequent bending and stooping  No lifting greater than 10 lbs. May use ice or moist heat for pain. Weight loss is of benefit. Best medication for lumbar disc disease is arthritis medications but you are unable to take these meds such as motrin, celebrex and naprosyn due to history of Gastric bypass surgery. Exercise is important to improve your indurance and does allow people to function better inspite of back pain.

## 2021-05-25 NOTE — Progress Notes (Signed)
Office Visit Note   Patient: Cristina [name]           Date of Birth: 09-07-1961           MRN: [id number] Visit Date: 05/25/2021              Requested by: [name], MD [place],   [id number] PCP: [name], MD   Assessment & Plan: Visit Diagnoses:  1. Type 2 diabetes mellitus without complication, without long-term current use of insulin ([place])   2. Unilateral primary osteoarthritis, left knee   3. Spondylolisthesis, lumbar region   4. Other spondylosis with radiculopathy, lumbar region   5. Spinal stenosis of lumbar region with neurogenic claudication   6. Chronic pain of left knee     Plan: Avoid frequent bending and stooping  No lifting greater than 10 lbs. May use ice or moist heat for pain. Weight loss is of benefit. Best medication for lumbar disc disease is arthritis medications but you are unable to take these meds such as motrin, celebrex and naprosyn due to history of Gastric bypass surgery. Exercise is important to improve your indurance and does allow people to function better inspite of back pain.    Follow-Up Instructions: No follow-ups on file.   Orders:  Orders Placed This Encounter  Procedures  . Hemoglobin A1C   No orders of the defined types were placed in this encounter.     Procedures: No procedures performed   Clinical Data: No additional findings.   Subjective: Chief Complaint  Patient presents with  . Lower Back - Follow-up    HPI  Review of Systems  Constitutional: Negative.   HENT: Negative.    Eyes: Negative.   Respiratory: Negative.    Cardiovascular: Negative.   Gastrointestinal: Negative.   Endocrine: Negative.   Genitourinary: Negative.   Musculoskeletal: Negative.   Skin: Negative.   Allergic/Immunologic: Negative.   Neurological: Negative.   Hematological: Negative.   Psychiatric/Behavioral: Negative.      Objective: Vital Signs: BP 119/81   Pulse 77   Ht 5' 1"  (1.549 m)    Wt 181 lb (82.1 kg)   LMP 11/11/2010   BMI 34.20 kg/m   Physical Exam Constitutional:      Appearance: She is well-developed.  HENT:     Head: Normocephalic and atraumatic.  Eyes:     Pupils: Pupils are equal, round, and reactive to light.  Pulmonary:     Effort: Pulmonary effort is normal.     Breath sounds: Normal breath sounds.  Abdominal:     General: Bowel sounds are normal.     Palpations: Abdomen is soft.  Musculoskeletal:     Cervical back: Normal range of motion and neck supple.     Lumbar back: Negative right straight leg raise test and negative left straight leg raise test.  Skin:    General: Skin is warm and dry.  Neurological:     Mental Status: She is alert and oriented to person, place, and time.  Psychiatric:        Behavior: Behavior normal.        Thought Content: Thought content normal.        Judgment: Judgment normal.   Back Exam   Tenderness  The patient is experiencing tenderness in the lumbar.  Range of Motion  Extension:  abnormal  Flexion:  abnormal  Lateral bend right:  abnormal  Lateral bend left:  abnormal  Rotation right:  abnormal  Rotation left:  abnormal   Muscle Strength  Right Quadriceps:  5/5  Right Hamstrings:  5/5  Left Hamstrings:  5/5   Tests  Straight leg raise right: negative Straight leg raise left: negative  Other  Toe walk: normal Heel walk: normal Sensation: normal Gait: normal     Specialty Comments:  No specialty comments available.  Imaging: No results found.   PMFS History: Patient Active Problem List   Diagnosis Date Noted  . Unilateral primary osteoarthritis, left knee 12/21/2020  . Morbid obesity with body mass index (BMI) of 45.0 to 49.9 in adult [facility]) 07/06/2018  . Type 2 diabetes mellitus without complication, without long-term current use of insulin ([place]) 05/22/2017  . Class 3 obesity with serious comorbidity and body mass index (BMI) of 40.0 to 44.9 in adult 05/21/2017  . Family  history of colon cancer 04/01/2017  . Irritable bowel syndrome with diarrhea 04/01/2017  . NSAID long-term use 04/01/2017  . Lymphocytosis 02/24/2017  . Essential hypertension 02/12/2017  . Mild intermittent asthma with acute exacerbation 12/12/2016  . Left shoulder pain 06/02/2015  . Insomnia 05/12/2014  . Fibromyalgia 05/12/2014  . Allergic rhinitis 03/28/2014  . Osteoarthritis 12/20/2013  . Cervical strain 06/21/2013  . Low back pain 06/21/2013  . Metatarsalgia of both feet 03/11/2013  . Sciatica 01/25/2013  . Restless leg syndrome 11/24/2012  . Pure hypercholesterolemia 10/20/2012  . OSA on CPAP 10/20/2012  . Depression 10/20/2012  . Iron deficiency anemia, unspecified 10/01/2012  . Snoring 10/01/2012  . Diabetes mellitus, type 2 ([place]) 12/31/2010  . ANAL FISSURE 12/31/2010  . Morbid obesity ([place]) 01/08/2007  . CONSTIPATION 01/08/2007   Past Medical History:  Diagnosis Date  . Allergic rhinitis, cause unspecified   . Anemia   . Arthritis   . Asthma   . Back pain   . Chest pain   . Constipation   . CTS (carpal tunnel syndrome)   . Cystocele   . Depression   . Diabetes mellitus   . Dysfunction of eustachian tube   . Fatty liver   . Fissure, anal   . Gallbladder problem   . Genital herpes   . GERD (gastroesophageal reflux disease)   . Headache   . Hemorrhoid   . Hx of migraine headaches   . Hyperlipidemia   . IBS (irritable bowel syndrome)   . Insomnia   . Iron deficiency anemia, unspecified   . Joint pain   . Nausea   . Obesity   . OSA on CPAP   . Osteoarthritis   . Rectocele   . Sleep apnea   . TMJ (dislocation of temporomandibular joint)     Family History  Problem Relation Age of Onset  . Hypertension Mother 35  . Osteoarthritis Mother   . Diabetes Mother   . Colon cancer Mother 80  . Cancer Mother 85       colon cancer  . Hyperlipidemia Mother   . Obesity Mother   . Sudden death Mother   . Arthritis Mother   . Colon cancer Paternal  Grandmother   . Cancer Paternal Grandmother        colon cancer  . Stroke Maternal Grandmother   . Colon cancer Maternal Grandmother   . Colon polyps Brother        x 2 brother  . Diabetes Daughter   . Hypertension Daughter   . Sleep apnea Daughter   . Mental illness Daughter        depression  .  Migraines Daughter   . Cirrhosis Brother        alcoholism  . Alcohol abuse Brother   . Migraines Daughter   . Mental illness Daughter        anxiety attacks  . Protein S deficiency Daughter   . Sleep apnea Brother   . Hyperlipidemia Brother   . Migraines Brother   . Obstructive Sleep Apnea Brother   . Colon cancer Paternal Aunt     Past Surgical History:  Procedure Laterality Date  . ABDOMINAL HYSTERECTOMY  11/11/2010   [place] Plan.  Ovaries intact.  Uterine fibroids with DUB.  [name] CARPAL TUNNEL RELEASE  1989   Left  . CHOLECYSTECTOMY    . CHOLECYSTECTOMY    . COLONOSCOPY  06/29/2012   normal.  Repeat in 5 years.  [name] [place]  . ESOPHAGOGASTRODUODENOSCOPY  12/13/2011   dysphagia.  Henrene Pastor.  Normal.  . 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 Sleep study  09/11/2012   severe OSA.  CPAP titration at 12 cm water pressure.  . TUBAL LIGATION  1988   Social History   Occupational History  . Occupation: Audiological scientist: [employer]    Comment: [employer]  Tobacco Use  . Smoking status: Former  . Smokeless tobacco: Never  . Tobacco comments:    smoked occasionally longest 6 mos.  Vaping Use  . Vaping Use: Never used  Substance and Sexual Activity  . Alcohol use: No    Alcohol/week: 1.0 standard drink    Types: 1 Glasses of wine per week  . Drug use: No  . Sexual activity: Never    Birth control/protection: None

## 2021-05-25 NOTE — Telephone Encounter (Signed)
Pt called about her blood test results.   CB [phone]

## 2021-05-25 NOTE — Progress Notes (Signed)
Surgical Instructions    Your procedure is scheduled on Thursday, July 21st, 2022.   Report to [facility] Main Entrance "A" at 07:50 A.M., then check in with the Admitting office.  Call this number if you have problems the morning of surgery:  [phone]   If you have any questions prior to your surgery date call [phone]: Open Monday-Friday 8am-4pm    Remember:  Do not eat after midnight the night before your surgery  You may drink clear liquids until 06:50 the morning of your surgery.   Clear liquids allowed are: Water, Non-Citrus Juices (without pulp), Carbonated Beverages, Clear Tea, Black Coffee Only, and Gatorade  Patient Instructions  The night before surgery:  No food after midnight. ONLY clear liquids after midnight  The day of surgery (if you have diabetes): Drink ONE (1) 12 oz G2 given to you in your pre admission testing appointment by 06:50 the morning of surgery. Drink in one sitting. Do not sip.  This drink was given to you during your hospital  pre-op appointment visit.  Nothing else to drink after completing the  12 oz bottle of G2.         If you have questions, please contact your surgeon's office.     Take these medicines the morning of surgery with A SIP OF WATER:  acyclovir (ZOVIRAX) atorvastatin (LIPITOR) buPROPion (WELLBUTRIN)  cetirizine (ZYRTEC) escitalopram (LEXAPRO) fluticasone (FLONASE)  gabapentin (NEURONTIN) pantoprazole (PROTONIX)  If needed  albuterol (VENTOLIN HFA)  azelastine (ASTELIN)  nitroGLYCERIN (NITROSTAT)  azelastine (OPTIVAR)   As of today, STOP taking any Aspirin (unless otherwise instructed by your surgeon) Aleve, Naproxen, Ibuprofen, Motrin, Advil, Goody's, BC's, all herbal medications, fish oil, and all vitamins.  WHAT DO I DO ABOUT MY DIABETES MEDICATION?   Do not take metFORMIN (GLUCOPHAGE) the morning of surgery. Do not take empagliflozin (JARDIANCE) the day before surgery and the day of surgery  HOW TO  MANAGE YOUR DIABETES BEFORE AND AFTER SURGERY  Why is it important to control my blood sugar before and after surgery? Improving blood sugar levels before and after surgery helps healing and can limit problems. A way of improving blood sugar control is eating a healthy diet by:  Eating less sugar and carbohydrates  Increasing activity/exercise  Talking with your doctor about reaching your blood sugar goals High blood sugars (greater than 180 mg/dL) can raise your risk of infections and slow your recovery, so you will need to focus on controlling your diabetes during the weeks before surgery. Make sure that the doctor who takes care of your diabetes knows about your planned surgery including the date and location.  How do I manage my blood sugar before surgery? Check your blood sugar at least 4 times a day, starting 2 days before surgery, to make sure that the level is not too high or low.  Check your blood sugar the morning of your surgery when you wake up and every 2 hours until you get to the Short Stay unit.  If your blood sugar is less than 70 mg/dL, you will need to treat for low blood sugar: Do not take insulin. Treat a low blood sugar (less than 70 mg/dL) with  cup of clear juice (cranberry or apple), 4 glucose tablets, OR glucose gel. Recheck blood sugar in 15 minutes after treatment (to make sure it is greater than 70 mg/dL). If your blood sugar is not greater than 70 mg/dL on recheck, call [phone] for further instructions. Report your blood sugar  to the short stay nurse when you get to Short Stay.  If you are admitted to the hospital after surgery: Your blood sugar will be checked by the staff and you will probably be given insulin after surgery (instead of oral diabetes medicines) to make sure you have good blood sugar levels. The goal for blood sugar control after surgery is 80-180 mg/dL.           Do not wear jewelry or makeup Do not wear lotions, powders, perfumes, or  deodorant. Do not shave 48 hours prior to surgery.   Do not bring valuables to the hospital. DO Not wear nail polish, gel polish, artificial nails, or any other type of covering on natural nails including finger and toenails. If patients have artificial nails, gel coating, etc. that need to be removed by a nail salon please have this removed prior to surgery or surgery may need to be canceled/delayed if the surgeon/ anesthesia feels like the patient is unable to be adequately monitored.             [facility] is not responsible for any belongings or valuables.  Do NOT Smoke (Tobacco/Vaping) or drink Alcohol 24 hours prior to your procedure If you use a CPAP at night, you may bring all equipment for your overnight stay.   Contacts, glasses, dentures or bridgework may not be worn into surgery, please bring cases for these belongings   For patients admitted to the hospital, discharge time will be determined by your treatment team.   Patients discharged the day of surgery will not be allowed to drive home, and someone needs to stay with them for 24 hours.  ONLY 1 SUPPORT PERSON MAY BE PRESENT WHILE YOU ARE IN SURGERY. IF YOU ARE TO BE ADMITTED ONCE YOU ARE IN YOUR ROOM YOU WILL BE ALLOWED TWO (2) VISITORS.  Minor children may have two parents present. Special consideration for safety and communication needs will be reviewed on a case by case basis.  Special instructions:    Oral Hygiene is also important to reduce your risk of infection.  Remember - BRUSH YOUR TEETH THE MORNING OF SURGERY WITH YOUR REGULAR TOOTHPASTE   [place]- Preparing For Surgery  Before surgery, you can play an important role. Because skin is not sterile, your skin needs to be as free of germs as possible. You can reduce the number of germs on your skin by washing with CHG (chlorahexidine gluconate) Soap before surgery.  CHG is an antiseptic cleaner which kills germs and bonds with the skin to continue killing germs  even after washing.     Please do not use if you have an allergy to CHG or antibacterial soaps. If your skin becomes reddened/irritated stop using the CHG.  Do not shave (including legs and underarms) for at least 48 hours prior to first CHG shower. It is OK to shave your face.  Please follow these instructions carefully.     Shower the NIGHT BEFORE SURGERY and the MORNING OF SURGERY with CHG Soap.   If you chose to wash your hair, wash your hair first as usual with your normal shampoo. After you shampoo, rinse your hair and body thoroughly to remove the shampoo.  Then [employer] and genitals (private parts) with your normal soap and rinse thoroughly to remove soap.  After that Use CHG Soap as you would any other liquid soap. You can apply CHG directly to the skin and wash gently with a scrungie or a  clean washcloth.   Apply the CHG Soap to your body ONLY FROM THE NECK DOWN.  Do not use on open wounds or open sores. Avoid contact with your eyes, ears, mouth and genitals (private parts). Wash Face and genitals (private parts)  with your normal soap.   Wash thoroughly, paying special attention to the area where your surgery will be performed.  Thoroughly rinse your body with warm water from the neck down.  DO NOT shower/wash with your normal soap after using and rinsing off the CHG Soap.  Pat yourself dry with a CLEAN TOWEL.  Wear CLEAN PAJAMAS to bed the night before surgery  Place CLEAN SHEETS on your bed the night before your surgery  DO NOT SLEEP WITH PETS.   Day of Surgery:  Take a shower with CHG soap. Wear Clean/Comfortable clothing the morning of surgery Do not apply any deodorants/lotions.   Remember to brush your teeth WITH YOUR REGULAR TOOTHPASTE.   Please read over the following fact sheets that you were given.

## 2021-05-26 LAB — HEMOGLOBIN A1C
Hgb A1c MFr Bld: 6.8 % of total Hgb — ABNORMAL HIGH (ref <5.7)
Mean Plasma Glucose: 148 mg/dL
eAG (mmol/L): 8.2 mmol/L

## 2021-05-28 ENCOUNTER — Encounter (HOSPITAL_COMMUNITY): Payer: Self-pay | Admitting: Orthopaedic Surgery

## 2021-05-28 ENCOUNTER — Encounter (HOSPITAL_COMMUNITY)
Admission: RE | Admit: 2021-05-28 | Discharge: 2021-05-28 | Disposition: A | Discharge status: Home / Self Care | Payer: 59 | Source: Ambulatory Visit | Attending: Orthopaedic Surgery | Admitting: Orthopaedic Surgery

## 2021-05-28 ENCOUNTER — Other Ambulatory Visit: Payer: Self-pay

## 2021-05-28 ENCOUNTER — Other Ambulatory Visit (HOSPITAL_COMMUNITY): Payer: 59

## 2021-05-28 DIAGNOSIS — Z01812 Encounter for preprocedural laboratory examination: Secondary | ICD-10-CM | POA: Diagnosis not present

## 2021-05-28 DIAGNOSIS — Z20822 Contact with and (suspected) exposure to covid-19: Secondary | ICD-10-CM | POA: Insufficient documentation

## 2021-05-28 LAB — BASIC METABOLIC PANEL
Anion gap: 7 (ref 5–15)
BUN: 17 mg/dL (ref 6–20)
CO2: 22 mmol/L (ref 22–32)
Calcium: 8.6 mg/dL — ABNORMAL LOW (ref 8.9–10.3)
Chloride: 111 mmol/L (ref 98–111)
Creatinine, Ser: 0.75 mg/dL (ref 0.44–1.00)
GFR, Estimated: >60 mL/min (ref >60)
Glucose, Bld: 99 mg/dL (ref 70–99)
Potassium: 4.2 mmol/L (ref 3.5–5.1)
Sodium: 140 mmol/L (ref 135–145)

## 2021-05-28 LAB — CBC
HCT: 46 % (ref 36.0–46.0)
Hemoglobin: 14 g/dL (ref 12.0–15.0)
MCH: 28.4 pg (ref 26.0–34.0)
MCHC: 30.4 g/dL (ref 30.0–36.0)
MCV: 93.3 fL (ref 80.0–100.0)
Platelets: 268 10*3/uL (ref 150–400)
RBC: 4.93 MIL/uL (ref 3.87–5.11)
RDW: 13.5 % (ref 11.5–15.5)
WBC: 9.6 10*3/uL (ref 4.0–10.5)
nRBC: 0 % (ref 0.0–0.2)

## 2021-05-28 LAB — SARS CORONAVIRUS 2 (TAT 6-24 HRS): SARS Coronavirus 2: NEGATIVE

## 2021-05-28 LAB — GLUCOSE, CAPILLARY: Glucose-Capillary: 84 mg/dL (ref 70–99)

## 2021-05-28 LAB — SURGICAL PCR SCREEN
MRSA, PCR: NEGATIVE
Staphylococcus aureus: NEGATIVE

## 2021-05-28 NOTE — Progress Notes (Signed)
PCP - Dr. Reginia Forts Cardiologist - Dr. Cathie Olden had chest pain a few years ago, no longer seeing him. Nothing heart related  Chest x-ray - Not indicated EKG - 06/09/20 Stress Test - 11/13/16 ECHO - 11/08/16 Cardiac Cath - Denies  Sleep Study - Yes was using CPAP but since losing weight no longer needed. OSA is mild now   DM - Type II Fasting Blood Sugar - ranges from 80 - 120 Checks Blood Sugar ___3__ times a day CBG at PAT appt 84  Blood Thinner Instructions:Denies  ERAS Protcol - Yes  PRE-SURGERY G2- given  COVID TEST- 05/28/21  Anesthesia review: No  Patient denies shortness of breath, fever, cough and chest pain at PAT appointment   All instructions explained to the patient, with a verbal understanding of the material. Patient agrees to go over the instructions while at home for a better understanding. Patient also instructed to wear a mask in public after being tested for COVID-19. The opportunity to ask questions was provided.

## 2021-05-30 ENCOUNTER — Telehealth: Payer: Self-pay | Admitting: Orthopaedic Surgery

## 2021-05-30 NOTE — Telephone Encounter (Signed)
Received medical records release form,$25.00 money order and a note for [place] from patient/Forwarding to [facility] today

## 2021-05-31 ENCOUNTER — Encounter (HOSPITAL_COMMUNITY)
Admission: AD | Disposition: A | Discharge status: Home Health | Payer: Self-pay | Source: Ambulatory Visit | Attending: Orthopaedic Surgery

## 2021-05-31 ENCOUNTER — Ambulatory Visit (HOSPITAL_COMMUNITY): Payer: 59 | Admitting: Anesthesiology

## 2021-05-31 ENCOUNTER — Encounter (HOSPITAL_COMMUNITY): Payer: Self-pay | Admitting: Orthopaedic Surgery

## 2021-05-31 ENCOUNTER — Other Ambulatory Visit: Payer: Self-pay

## 2021-05-31 ENCOUNTER — Inpatient Hospital Stay (HOSPITAL_COMMUNITY)
Admission: AD | Admit: 2021-05-31 | Discharge: 2021-06-02 | DRG: 470 | Disposition: A | Discharge status: Home Health | Payer: 59 | Source: Ambulatory Visit | Attending: Orthopaedic Surgery | Admitting: Orthopaedic Surgery

## 2021-05-31 ENCOUNTER — Observation Stay (HOSPITAL_COMMUNITY): Payer: 59

## 2021-05-31 DIAGNOSIS — Z9049 Acquired absence of other specified parts of digestive tract: Secondary | ICD-10-CM

## 2021-05-31 DIAGNOSIS — E7800 Pure hypercholesterolemia, unspecified: Secondary | ICD-10-CM | POA: Diagnosis not present

## 2021-05-31 DIAGNOSIS — Z8371 Family history of colonic polyps: Secondary | ICD-10-CM

## 2021-05-31 DIAGNOSIS — Z96652 Presence of left artificial knee joint: Secondary | ICD-10-CM

## 2021-05-31 DIAGNOSIS — G2581 Restless legs syndrome: Secondary | ICD-10-CM | POA: Diagnosis not present

## 2021-05-31 DIAGNOSIS — G8918 Other acute postprocedural pain: Secondary | ICD-10-CM | POA: Diagnosis not present

## 2021-05-31 DIAGNOSIS — Z6833 Body mass index (BMI) 33.0-33.9, adult: Secondary | ICD-10-CM | POA: Diagnosis not present

## 2021-05-31 DIAGNOSIS — Z823 Family history of stroke: Secondary | ICD-10-CM

## 2021-05-31 DIAGNOSIS — Z833 Family history of diabetes mellitus: Secondary | ICD-10-CM | POA: Diagnosis not present

## 2021-05-31 DIAGNOSIS — K219 Gastro-esophageal reflux disease without esophagitis: Secondary | ICD-10-CM | POA: Diagnosis present

## 2021-05-31 DIAGNOSIS — J45909 Unspecified asthma, uncomplicated: Secondary | ICD-10-CM | POA: Diagnosis not present

## 2021-05-31 DIAGNOSIS — Z90710 Acquired absence of both cervix and uterus: Secondary | ICD-10-CM

## 2021-05-31 DIAGNOSIS — Z20822 Contact with and (suspected) exposure to covid-19: Secondary | ICD-10-CM | POA: Diagnosis not present

## 2021-05-31 DIAGNOSIS — I10 Essential (primary) hypertension: Secondary | ICD-10-CM | POA: Diagnosis not present

## 2021-05-31 DIAGNOSIS — Z83438 Family history of other disorder of lipoprotein metabolism and other lipidemia: Secondary | ICD-10-CM | POA: Diagnosis not present

## 2021-05-31 DIAGNOSIS — M797 Fibromyalgia: Secondary | ICD-10-CM | POA: Diagnosis present

## 2021-05-31 DIAGNOSIS — M1712 Unilateral primary osteoarthritis, left knee: Secondary | ICD-10-CM | POA: Diagnosis not present

## 2021-05-31 DIAGNOSIS — Z8249 Family history of ischemic heart disease and other diseases of the circulatory system: Secondary | ICD-10-CM

## 2021-05-31 DIAGNOSIS — Z800 Family history of malignant neoplasm of digestive organs: Secondary | ICD-10-CM

## 2021-05-31 DIAGNOSIS — Z8261 Family history of arthritis: Secondary | ICD-10-CM | POA: Diagnosis not present

## 2021-05-31 DIAGNOSIS — Z811 Family history of alcohol abuse and dependence: Secondary | ICD-10-CM

## 2021-05-31 DIAGNOSIS — G4733 Obstructive sleep apnea (adult) (pediatric): Secondary | ICD-10-CM | POA: Diagnosis present

## 2021-05-31 DIAGNOSIS — Z87891 Personal history of nicotine dependence: Secondary | ICD-10-CM

## 2021-05-31 DIAGNOSIS — K760 Fatty (change of) liver, not elsewhere classified: Secondary | ICD-10-CM | POA: Diagnosis present

## 2021-05-31 DIAGNOSIS — E119 Type 2 diabetes mellitus without complications: Secondary | ICD-10-CM | POA: Diagnosis not present

## 2021-05-31 DIAGNOSIS — Z471 Aftercare following joint replacement surgery: Secondary | ICD-10-CM | POA: Diagnosis not present

## 2021-05-31 HISTORY — PX: TOTAL KNEE ARTHROPLASTY: SHX125

## 2021-05-31 HISTORY — DX: Fibromyalgia: M79.7

## 2021-05-31 LAB — GLUCOSE, CAPILLARY
Glucose-Capillary: 154 mg/dL — ABNORMAL HIGH (ref 70–99)
Glucose-Capillary: 167 mg/dL — ABNORMAL HIGH (ref 70–99)
Glucose-Capillary: 200 mg/dL — ABNORMAL HIGH (ref 70–99)
Glucose-Capillary: 222 mg/dL — ABNORMAL HIGH (ref 70–99)

## 2021-05-31 SURGERY — ARTHROPLASTY, KNEE, TOTAL
Anesthesia: Monitor Anesthesia Care | Site: Knee | Laterality: Left

## 2021-05-31 MED ORDER — HYDROMORPHONE HCL 1 MG/ML IJ SOLN
0.5000 mg | INTRAMUSCULAR | Status: DC | PRN
  Administered 2021-05-31: 0.5 mg via INTRAVENOUS
  Administered 2021-05-31 – 2021-06-02 (×6): 1 mg via INTRAVENOUS
  Filled 2021-05-31 (×6): qty 1

## 2021-05-31 MED ORDER — ORAL CARE MOUTH RINSE: 15.0000 mL | Freq: Once | OROMUCOSAL | Status: AC

## 2021-05-31 MED ORDER — MIDAZOLAM HCL 2 MG/2ML IJ SOLN: 2.0000 mg | Freq: Once | INTRAMUSCULAR | Status: AC

## 2021-05-31 MED ORDER — OXYCODONE HCL 5 MG PO TABS
5.0000 mg | ORAL_TABLET | ORAL | Status: DC | PRN
  Administered 2021-05-31 – 2021-06-02 (×6): 10 mg via ORAL
  Filled 2021-05-31 (×5): qty 2

## 2021-05-31 MED ORDER — POVIDONE-IODINE 10 % EX SWAB: 2.0000 "application " | Freq: Once | CUTANEOUS | Status: DC

## 2021-05-31 MED ORDER — ZOLPIDEM TARTRATE 5 MG PO TABS: 5.0000 mg | ORAL_TABLET | Freq: Every evening | ORAL | Status: DC | PRN

## 2021-05-31 MED ORDER — BUPROPION HCL 100 MG PO TABS
100.0000 mg | ORAL_TABLET | Freq: Every day | ORAL | Status: DC
  Administered 2021-06-01 – 2021-06-02 (×2): 100 mg via ORAL
  Filled 2021-05-31 (×2): qty 1

## 2021-05-31 MED ORDER — EPHEDRINE SULFATE-NACL 50-0.9 MG/10ML-% IV SOSY
PREFILLED_SYRINGE | INTRAVENOUS | Status: DC | PRN
  Administered 2021-05-31 (×2): 10 mg via INTRAVENOUS
  Administered 2021-05-31: 5 mg via INTRAVENOUS

## 2021-05-31 MED ORDER — ASPIRIN 81 MG PO CHEW
81.0000 mg | CHEWABLE_TABLET | Freq: Two times a day (BID) | ORAL | Status: DC
  Administered 2021-06-01 – 2021-06-02 (×2): 81 mg via ORAL
  Filled 2021-05-31 (×4): qty 1

## 2021-05-31 MED ORDER — ACETAMINOPHEN 325 MG PO TABS: 325.0000 mg | ORAL_TABLET | ORAL | Status: DC | PRN

## 2021-05-31 MED ORDER — METOCLOPRAMIDE HCL 5 MG PO TABS: 5.0000 mg | ORAL_TABLET | Freq: Three times a day (TID) | ORAL | Status: DC | PRN

## 2021-05-31 MED ORDER — LIDOCAINE 2% (20 MG/ML) 5 ML SYRINGE
INTRAMUSCULAR | Status: AC
  Filled 2021-05-31: qty 5

## 2021-05-31 MED ORDER — INSULIN ASPART 100 UNIT/ML IJ SOLN
0.0000 [IU] | Freq: Three times a day (TID) | INTRAMUSCULAR | Status: DC
  Administered 2021-05-31: 3 [IU] via SUBCUTANEOUS
  Administered 2021-06-01: 2 [IU] via SUBCUTANEOUS
  Administered 2021-06-01 (×2): 3 [IU] via SUBCUTANEOUS
  Administered 2021-06-02: 11 [IU] via SUBCUTANEOUS

## 2021-05-31 MED ORDER — FENTANYL CITRATE (PF) 250 MCG/5ML IJ SOLN
INTRAMUSCULAR | Status: DC | PRN
  Administered 2021-05-31: 50 ug via INTRAVENOUS

## 2021-05-31 MED ORDER — OXYCODONE HCL 5 MG/5ML PO SOLN: 5.0000 mg | Freq: Once | ORAL | Status: DC | PRN

## 2021-05-31 MED ORDER — PROPOFOL 500 MG/50ML IV EMUL
INTRAVENOUS | Status: DC | PRN
  Administered 2021-05-31: 75 ug/kg/min via INTRAVENOUS

## 2021-05-31 MED ORDER — BUPIVACAINE HCL 0.5 % IJ SOLN
INTRAMUSCULAR | Status: DC | PRN
  Administered 2021-05-31: 15 mL

## 2021-05-31 MED ORDER — ESCITALOPRAM OXALATE 10 MG PO TABS
10.0000 mg | ORAL_TABLET | Freq: Every day | ORAL | Status: DC
  Filled 2021-05-31 (×2): qty 1

## 2021-05-31 MED ORDER — BUPIVACAINE LIPOSOME 1.3 % IJ SUSP
INTRAMUSCULAR | Status: DC | PRN
  Administered 2021-05-31: 10 mL

## 2021-05-31 MED ORDER — OXYCODONE HCL 5 MG PO TABS: 5.0000 mg | ORAL_TABLET | Freq: Once | ORAL | Status: DC | PRN

## 2021-05-31 MED ORDER — DEXAMETHASONE SODIUM PHOSPHATE 10 MG/ML IJ SOLN
INTRAMUSCULAR | Status: DC | PRN
  Administered 2021-05-31: 10 mg via INTRAVENOUS

## 2021-05-31 MED ORDER — INSULIN ASPART 100 UNIT/ML IJ SOLN
0.0000 [IU] | Freq: Every day | INTRAMUSCULAR | Status: DC
  Administered 2021-05-31: 2 [IU] via SUBCUTANEOUS

## 2021-05-31 MED ORDER — CHLORHEXIDINE GLUCONATE 0.12 % MT SOLN
15.0000 mL | Freq: Once | OROMUCOSAL | Status: AC
  Administered 2021-05-31: 15 mL via OROMUCOSAL
  Filled 2021-05-31: qty 15

## 2021-05-31 MED ORDER — PHENOL 1.4 % MT LIQD: 1.0000 | OROMUCOSAL | Status: DC | PRN

## 2021-05-31 MED ORDER — ONDANSETRON HCL 4 MG/2ML IJ SOLN: 4.0000 mg | Freq: Four times a day (QID) | INTRAMUSCULAR | Status: DC | PRN

## 2021-05-31 MED ORDER — ACETAMINOPHEN 160 MG/5ML PO SOLN: 325.0000 mg | ORAL | Status: DC | PRN

## 2021-05-31 MED ORDER — OXYCODONE HCL 5 MG PO TABS
10.0000 mg | ORAL_TABLET | ORAL | Status: DC | PRN
  Administered 2021-05-31 – 2021-06-02 (×2): 15 mg via ORAL
  Filled 2021-05-31: qty 3

## 2021-05-31 MED ORDER — FENTANYL CITRATE (PF) 100 MCG/2ML IJ SOLN
INTRAMUSCULAR | Status: AC
  Administered 2021-05-31: 100 ug via INTRAVENOUS
  Filled 2021-05-31: qty 2

## 2021-05-31 MED ORDER — FENTANYL CITRATE (PF) 100 MCG/2ML IJ SOLN
25.0000 ug | INTRAMUSCULAR | Status: DC | PRN
  Administered 2021-05-31: 50 ug via INTRAVENOUS

## 2021-05-31 MED ORDER — DEXAMETHASONE SODIUM PHOSPHATE 10 MG/ML IJ SOLN
INTRAMUSCULAR | Status: AC
  Filled 2021-05-31: qty 1

## 2021-05-31 MED ORDER — MENTHOL 3 MG MT LOZG: 1.0000 | LOZENGE | OROMUCOSAL | Status: DC | PRN

## 2021-05-31 MED ORDER — PANTOPRAZOLE SODIUM 40 MG PO TBEC
40.0000 mg | DELAYED_RELEASE_TABLET | Freq: Two times a day (BID) | ORAL | Status: DC
  Administered 2021-05-31 – 2021-06-02 (×4): 40 mg via ORAL
  Filled 2021-05-31 (×4): qty 1

## 2021-05-31 MED ORDER — FENTANYL CITRATE (PF) 100 MCG/2ML IJ SOLN: 100.0000 ug | Freq: Once | INTRAMUSCULAR | Status: AC

## 2021-05-31 MED ORDER — KETOROLAC TROMETHAMINE 15 MG/ML IJ SOLN
15.0000 mg | Freq: Four times a day (QID) | INTRAMUSCULAR | Status: AC
  Administered 2021-05-31 – 2021-06-01 (×4): 15 mg via INTRAVENOUS
  Filled 2021-05-31 (×4): qty 1

## 2021-05-31 MED ORDER — DOCUSATE SODIUM 100 MG PO CAPS
100.0000 mg | ORAL_CAPSULE | Freq: Two times a day (BID) | ORAL | Status: DC
  Administered 2021-05-31 – 2021-06-02 (×4): 100 mg via ORAL
  Filled 2021-05-31 (×4): qty 1

## 2021-05-31 MED ORDER — GABAPENTIN 300 MG PO CAPS
300.0000 mg | ORAL_CAPSULE | Freq: Two times a day (BID) | ORAL | Status: DC
  Administered 2021-05-31 – 2021-06-02 (×4): 300 mg via ORAL
  Filled 2021-05-31 (×4): qty 1

## 2021-05-31 MED ORDER — FENTANYL CITRATE (PF) 100 MCG/2ML IJ SOLN: 25.0000 ug | INTRAMUSCULAR | Status: DC | PRN

## 2021-05-31 MED ORDER — ACETAMINOPHEN 325 MG PO TABS
325.0000 mg | ORAL_TABLET | Freq: Four times a day (QID) | ORAL | Status: DC | PRN
  Administered 2021-06-01 – 2021-06-02 (×3): 650 mg via ORAL
  Filled 2021-05-31 (×3): qty 2

## 2021-05-31 MED ORDER — FENTANYL CITRATE (PF) 100 MCG/2ML IJ SOLN
INTRAMUSCULAR | Status: AC
  Administered 2021-05-31: 50 ug via INTRAVENOUS
  Filled 2021-05-31: qty 2

## 2021-05-31 MED ORDER — CEFAZOLIN SODIUM-DEXTROSE 2-4 GM/100ML-% IV SOLN
2.0000 g | INTRAVENOUS | Status: AC
  Administered 2021-05-31: 2 g via INTRAVENOUS
  Filled 2021-05-31: qty 100

## 2021-05-31 MED ORDER — ATORVASTATIN CALCIUM 10 MG PO TABS
20.0000 mg | ORAL_TABLET | Freq: Every day | ORAL | Status: DC
  Administered 2021-06-02: 20 mg via ORAL
  Filled 2021-05-31 (×2): qty 2

## 2021-05-31 MED ORDER — METFORMIN HCL 500 MG PO TABS
500.0000 mg | ORAL_TABLET | Freq: Every day | ORAL | Status: DC
  Administered 2021-05-31 – 2021-06-01 (×2): 500 mg via ORAL
  Filled 2021-05-31 (×2): qty 1

## 2021-05-31 MED ORDER — PROPOFOL 10 MG/ML IV BOLUS
INTRAVENOUS | Status: DC | PRN
  Administered 2021-05-31: 20 mg via INTRAVENOUS
  Administered 2021-05-31: 10 mg via INTRAVENOUS

## 2021-05-31 MED ORDER — ACYCLOVIR 400 MG PO TABS
400.0000 mg | ORAL_TABLET | Freq: Two times a day (BID) | ORAL | Status: DC
  Administered 2021-05-31 – 2021-06-02 (×4): 400 mg via ORAL
  Filled 2021-05-31 (×5): qty 1

## 2021-05-31 MED ORDER — ALBUTEROL SULFATE HFA 108 (90 BASE) MCG/ACT IN AERS: 1.0000 | INHALATION_SPRAY | Freq: Four times a day (QID) | RESPIRATORY_TRACT | Status: DC | PRN

## 2021-05-31 MED ORDER — SODIUM CHLORIDE 0.9 % IR SOLN
Status: DC | PRN
  Administered 2021-05-31: 3000 mL

## 2021-05-31 MED ORDER — ONDANSETRON HCL 4 MG/2ML IJ SOLN
4.0000 mg | Freq: Once | INTRAMUSCULAR | Status: DC | PRN
Start: 2021-05-31 — End: 2021-05-31

## 2021-05-31 MED ORDER — CALCIUM CARBONATE 1250 (500 CA) MG PO TABS
1250.0000 mg | ORAL_TABLET | Freq: Every day | ORAL | Status: DC
  Administered 2021-06-01 – 2021-06-02 (×2): 1250 mg via ORAL
  Filled 2021-05-31 (×2): qty 1

## 2021-05-31 MED ORDER — ONDANSETRON HCL 4 MG/2ML IJ SOLN: 4.0000 mg | Freq: Once | INTRAMUSCULAR | Status: DC | PRN

## 2021-05-31 MED ORDER — EMPAGLIFLOZIN 25 MG PO TABS
25.0000 mg | ORAL_TABLET | Freq: Every day | ORAL | Status: DC
  Administered 2021-06-01 – 2021-06-02 (×2): 25 mg via ORAL
  Filled 2021-05-31 (×2): qty 1

## 2021-05-31 MED ORDER — FLUTICASONE PROPIONATE 50 MCG/ACT NA SUSP
2.0000 | Freq: Every day | NASAL | Status: DC
  Administered 2021-06-01 – 2021-06-02 (×2): 2 via NASAL
  Filled 2021-05-31: qty 16

## 2021-05-31 MED ORDER — ONDANSETRON HCL 4 MG/2ML IJ SOLN
INTRAMUSCULAR | Status: AC
  Filled 2021-05-31: qty 2

## 2021-05-31 MED ORDER — DIPHENHYDRAMINE HCL 12.5 MG/5ML PO ELIX: 12.5000 mg | ORAL_SOLUTION | ORAL | Status: DC | PRN

## 2021-05-31 MED ORDER — 0.9 % SODIUM CHLORIDE (POUR BTL) OPTIME
TOPICAL | Status: DC | PRN
  Administered 2021-05-31: 1000 mL

## 2021-05-31 MED ORDER — LACTATED RINGERS IV SOLN: INTRAVENOUS | Status: DC

## 2021-05-31 MED ORDER — METHOCARBAMOL 500 MG PO TABS
ORAL_TABLET | ORAL | Status: AC
  Filled 2021-05-31: qty 1

## 2021-05-31 MED ORDER — ONDANSETRON HCL 4 MG/2ML IJ SOLN
INTRAMUSCULAR | Status: DC | PRN
  Administered 2021-05-31: 4 mg via INTRAVENOUS

## 2021-05-31 MED ORDER — CEFAZOLIN SODIUM-DEXTROSE 1-4 GM/50ML-% IV SOLN
1.0000 g | Freq: Four times a day (QID) | INTRAVENOUS | Status: AC
  Administered 2021-05-31 – 2021-06-01 (×2): 1 g via INTRAVENOUS
  Filled 2021-05-31 (×2): qty 50

## 2021-05-31 MED ORDER — MIDAZOLAM HCL 2 MG/2ML IJ SOLN
INTRAMUSCULAR | Status: AC
  Administered 2021-05-31: 2 mg via INTRAVENOUS
  Filled 2021-05-31: qty 2

## 2021-05-31 MED ORDER — OXYCODONE HCL 5 MG PO TABS
ORAL_TABLET | ORAL | Status: AC
  Filled 2021-05-31: qty 1

## 2021-05-31 MED ORDER — METOCLOPRAMIDE HCL 5 MG/ML IJ SOLN: 5.0000 mg | Freq: Three times a day (TID) | INTRAMUSCULAR | Status: DC | PRN

## 2021-05-31 MED ORDER — HYDROMORPHONE HCL 1 MG/ML IJ SOLN
INTRAMUSCULAR | Status: AC
  Filled 2021-05-31: qty 1

## 2021-05-31 MED ORDER — FENTANYL CITRATE (PF) 100 MCG/2ML IJ SOLN
INTRAMUSCULAR | Status: AC
  Administered 2021-05-31: 25 ug via INTRAVENOUS
  Filled 2021-05-31: qty 2

## 2021-05-31 MED ORDER — MEPERIDINE HCL 25 MG/ML IJ SOLN: 6.2500 mg | INTRAMUSCULAR | Status: DC | PRN

## 2021-05-31 MED ORDER — PROPOFOL 10 MG/ML IV BOLUS
INTRAVENOUS | Status: AC
  Filled 2021-05-31: qty 20

## 2021-05-31 MED ORDER — TRANEXAMIC ACID-NACL 1000-0.7 MG/100ML-% IV SOLN
1000.0000 mg | INTRAVENOUS | Status: AC
  Administered 2021-05-31: 1000 mg via INTRAVENOUS
  Filled 2021-05-31: qty 100

## 2021-05-31 MED ORDER — OXYCODONE HCL 5 MG PO TABS
ORAL_TABLET | ORAL | Status: AC
  Filled 2021-05-31: qty 2

## 2021-05-31 MED ORDER — SODIUM CHLORIDE 0.9 % IV SOLN: INTRAVENOUS | Status: DC

## 2021-05-31 MED ORDER — BUPIVACAINE IN DEXTROSE 0.75-8.25 % IT SOLN
INTRATHECAL | Status: DC | PRN
  Administered 2021-05-31: 1.6 mL via INTRATHECAL

## 2021-05-31 MED ORDER — ONDANSETRON HCL 4 MG PO TABS: 4.0000 mg | ORAL_TABLET | Freq: Four times a day (QID) | ORAL | Status: DC | PRN

## 2021-05-31 MED ORDER — METHOCARBAMOL 1000 MG/10ML IJ SOLN
500.0000 mg | Freq: Four times a day (QID) | INTRAVENOUS | Status: DC | PRN
  Filled 2021-05-31: qty 5

## 2021-05-31 MED ORDER — FENTANYL CITRATE (PF) 250 MCG/5ML IJ SOLN
INTRAMUSCULAR | Status: AC
  Filled 2021-05-31: qty 5

## 2021-05-31 MED ORDER — ALUM & MAG HYDROXIDE-SIMETH 200-200-20 MG/5ML PO SUSP: 30.0000 mL | ORAL | Status: DC | PRN

## 2021-05-31 MED ORDER — EPHEDRINE 5 MG/ML INJ
INTRAVENOUS | Status: AC
  Filled 2021-05-31: qty 5

## 2021-05-31 MED ORDER — PHENYLEPHRINE 40 MCG/ML (10ML) SYRINGE FOR IV PUSH (FOR BLOOD PRESSURE SUPPORT)
PREFILLED_SYRINGE | INTRAVENOUS | Status: AC
  Filled 2021-05-31: qty 10

## 2021-05-31 MED ORDER — METHOCARBAMOL 500 MG PO TABS
500.0000 mg | ORAL_TABLET | Freq: Four times a day (QID) | ORAL | Status: DC | PRN
  Administered 2021-05-31 – 2021-06-01 (×3): 500 mg via ORAL
  Filled 2021-05-31 (×2): qty 1

## 2021-05-31 MED ORDER — ADULT MULTIVITAMIN W/MINERALS CH
1.0000 | ORAL_TABLET | Freq: Every day | ORAL | Status: DC
  Administered 2021-06-01 – 2021-06-02 (×2): 1 via ORAL
  Filled 2021-05-31 (×2): qty 1

## 2021-05-31 MED ORDER — PHENYLEPHRINE 40 MCG/ML (10ML) SYRINGE FOR IV PUSH (FOR BLOOD PRESSURE SUPPORT)
PREFILLED_SYRINGE | INTRAVENOUS | Status: DC | PRN
  Administered 2021-05-31 (×3): 80 ug via INTRAVENOUS
  Administered 2021-05-31 (×2): 40 ug via INTRAVENOUS
  Administered 2021-05-31: 80 ug via INTRAVENOUS

## 2021-05-31 SURGICAL SUPPLY — 71 items
BAG COUNTER SPONGE SURGICOUNT (BAG) ×2 IMPLANT
BAG SPNG CNTER NS LX DISP (BAG) ×1
BANDAGE ESMARK 6X9 LF (GAUZE/BANDAGES/DRESSINGS) ×1 IMPLANT
BASEPLATE TIBIAL SZ2 TRI (Joint) IMPLANT
BLADE SAG 18X100X1.27 (BLADE) ×2 IMPLANT
BNDG CMPR 9X6 STRL LF SNTH (GAUZE/BANDAGES/DRESSINGS) ×1
BNDG ELASTIC 6X5.8 VLCR STR LF (GAUZE/BANDAGES/DRESSINGS) ×3 IMPLANT
BNDG ESMARK 6X9 LF (GAUZE/BANDAGES/DRESSINGS) ×2
BOWL SMART MIX CTS (DISPOSABLE) ×2 IMPLANT
BSPLAT TIB 2 KN TRITANIUM (Joint) ×1 IMPLANT
COVER SURGICAL LIGHT HANDLE (MISCELLANEOUS) ×2 IMPLANT
CUFF TOURN SGL QUICK 34 (TOURNIQUET CUFF) ×2
CUFF TOURN SGL QUICK 42 (TOURNIQUET CUFF) IMPLANT
CUFF TRNQT CYL 34X4.125X (TOURNIQUET CUFF) ×1 IMPLANT
DRAPE EXTREMITY T 121X128X90 (DISPOSABLE) ×2 IMPLANT
DRAPE HALF SHEET 40X57 (DRAPES) ×2 IMPLANT
DRAPE U-SHAPE 47X51 STRL (DRAPES) ×2 IMPLANT
DRSG PAD ABDOMINAL 8X10 ST (GAUZE/BANDAGES/DRESSINGS) ×2 IMPLANT
DURAPREP 26ML APPLICATOR (WOUND CARE) ×2 IMPLANT
ELECT CAUTERY BLADE 6.4 (BLADE) ×2 IMPLANT
ELECT REM PT RETURN 9FT ADLT (ELECTROSURGICAL) ×2
ELECTRODE REM PT RTRN 9FT ADLT (ELECTROSURGICAL) ×1 IMPLANT
FACESHIELD WRAPAROUND (MASK) ×4 IMPLANT
FACESHIELD WRAPAROUND OR TEAM (MASK) ×2 IMPLANT
FEMORAL POST STABILIZED SZ2 LT (Joint) ×1 IMPLANT
GAUZE SPONGE 4X4 12PLY STRL (GAUZE/BANDAGES/DRESSINGS) ×2 IMPLANT
GAUZE SPONGE 4X4 12PLY STRL LF (GAUZE/BANDAGES/DRESSINGS) ×1 IMPLANT
GAUZE XEROFORM 1X8 LF (GAUZE/BANDAGES/DRESSINGS) ×2 IMPLANT
GAUZE XEROFORM 5X9 LF (GAUZE/BANDAGES/DRESSINGS) ×1 IMPLANT
GLOVE SRG 8 PF TXTR STRL LF DI (GLOVE) ×2 IMPLANT
GLOVE SURG ORTHO LTX SZ7.5 (GLOVE) ×2 IMPLANT
GLOVE SURG ORTHO LTX SZ8 (GLOVE) ×2 IMPLANT
GLOVE SURG UNDER POLY LF SZ8 (GLOVE) ×4
GOWN STRL REUS W/ TWL LRG LVL3 (GOWN DISPOSABLE) IMPLANT
GOWN STRL REUS W/ TWL XL LVL3 (GOWN DISPOSABLE) ×2 IMPLANT
GOWN STRL REUS W/TWL LRG LVL3 (GOWN DISPOSABLE)
GOWN STRL REUS W/TWL XL LVL3 (GOWN DISPOSABLE) ×4
HANDPIECE INTERPULSE COAX TIP (DISPOSABLE) ×2
IMMOBILIZER KNEE 22 UNIV (SOFTGOODS) ×2 IMPLANT
INSERT TIB BEARING SZ 2 11 (Knees) ×1 IMPLANT
KIT BASIN OR (CUSTOM PROCEDURE TRAY) ×2 IMPLANT
KIT TURNOVER KIT B (KITS) ×2 IMPLANT
KNEE PATELLA ASYMMETRIC 9X29 (Knees) ×1 IMPLANT
MANIFOLD NEPTUNE II (INSTRUMENTS) ×2 IMPLANT
NDL 18GX1X1/2 (RX/OR ONLY) (NEEDLE) IMPLANT
NEEDLE 18GX1X1/2 (RX/OR ONLY) (NEEDLE) IMPLANT
NS IRRIG 1000ML POUR BTL (IV SOLUTION) ×2 IMPLANT
PACK TOTAL JOINT (CUSTOM PROCEDURE TRAY) ×2 IMPLANT
PAD ABD 8X10 STRL (GAUZE/BANDAGES/DRESSINGS) ×1 IMPLANT
PAD ARMBOARD 7.5X6 YLW CONV (MISCELLANEOUS) ×2 IMPLANT
PADDING CAST COTTON 6X4 STRL (CAST SUPPLIES) ×2 IMPLANT
PIN FLUTED HEDLESS FIX 3.5X1/8 (PIN) ×1 IMPLANT
SET HNDPC FAN SPRY TIP SCT (DISPOSABLE) ×1 IMPLANT
SET PAD KNEE POSITIONER (MISCELLANEOUS) ×2 IMPLANT
STAPLER VISISTAT 35W (STAPLE) IMPLANT
STRIP CLOSURE SKIN 1/2X4 (GAUZE/BANDAGES/DRESSINGS) IMPLANT
SUCTION FRAZIER HANDLE 10FR (MISCELLANEOUS) ×2
SUCTION TUBE FRAZIER 10FR DISP (MISCELLANEOUS) ×1 IMPLANT
SUT MNCRL AB 4-0 PS2 18 (SUTURE) IMPLANT
SUT VIC AB 0 CT1 27 (SUTURE) ×2
SUT VIC AB 0 CT1 27XBRD ANBCTR (SUTURE) ×1 IMPLANT
SUT VIC AB 1 CT1 27 (SUTURE) ×4
SUT VIC AB 1 CT1 27XBRD ANBCTR (SUTURE) ×2 IMPLANT
SUT VIC AB 2-0 CT1 27 (SUTURE) ×4
SUT VIC AB 2-0 CT1 TAPERPNT 27 (SUTURE) ×2 IMPLANT
SYR 50ML LL SCALE MARK (SYRINGE) IMPLANT
TIBIAL BASEPLATE SZ2 TRI (Joint) ×2 IMPLANT
TOWEL GREEN STERILE (TOWEL DISPOSABLE) ×2 IMPLANT
TOWEL GREEN STERILE FF (TOWEL DISPOSABLE) ×2 IMPLANT
TRAY CATH 16FR W/PLASTIC CATH (SET/KITS/TRAYS/PACK) IMPLANT
WRAP KNEE MAXI GEL POST OP (GAUZE/BANDAGES/DRESSINGS) ×2 IMPLANT

## 2021-05-31 NOTE — Anesthesia Procedure Notes (Signed)
Procedure Name: MAC Date/Time: 05/31/2021 9:55 AM Performed by: Jenne Campus, CRNA Pre-anesthesia Checklist: Patient identified, Emergency Drugs available, Suction available and Patient being monitored Oxygen Delivery Method: Simple face mask

## 2021-05-31 NOTE — Transfer of Care (Signed)
Immediate Anesthesia Transfer of Care Note  Patient: Cristina [name]  Procedure(s) Performed: LEFT TOTAL KNEE ARTHROPLASTY (Left: Knee)  Patient Location: PACU  Anesthesia Type:MAC and Spinal  Level of Consciousness: awake, alert , oriented and patient cooperative  Airway & Oxygen Therapy: Patient Spontanous Breathing and Patient connected to face mask oxygen  Post-op Assessment: Report given to RN and Post -op Vital signs reviewed and stable  Post vital signs: Reviewed  Last Vitals:  Vitals Value Taken Time  BP    Temp    Pulse 66 05/31/21 1139  Resp 26 05/31/21 1139  SpO2 99 % 05/31/21 1139  Vitals shown include unvalidated device data.  Last Pain:  Vitals:   05/31/21 0816  TempSrc:   PainSc: 3       Patients Stated Pain Goal: 3 (91/98/02 2179)  Complications: No notable events documented.

## 2021-05-31 NOTE — Plan of Care (Signed)

## 2021-05-31 NOTE — Anesthesia Preprocedure Evaluation (Addendum)
Anesthesia Evaluation  Patient identified by MRN, date of birth, ID band Patient awake    Reviewed: Allergy & Precautions, NPO status , Patient's Chart, lab work & pertinent test results  History of Anesthesia Complications Negative for: history of anesthetic complications  Airway Mallampati: I  TM Distance: >3 FB Neck ROM: Full    Dental  (+) Dental Advisory Given, Teeth Intact   Pulmonary asthma , sleep apnea , former smoker,    breath sounds clear to auscultation       Cardiovascular hypertension (no longer on meds), (-) angina Rhythm:Regular Rate:Normal   '18 Myoperfusion - Nuclear stress EF: 69%. There was no ST segment deviation noted during stress. The study is normal.   Neuro/Psych  Headaches, PSYCHIATRIC DISORDERS Depression    GI/Hepatic Neg liver ROS, GERD  Medicated and Controlled, IBS    Endo/Other  diabetes, Type 2, Oral Hypoglycemic Agents, Insulin DependentMorbid obesity  Renal/GU negative Renal ROS     Musculoskeletal  (+) Arthritis , Osteoarthritis,  Fibromyalgia - TMJ dysfunction Carpal Tunnel Syndrome    Abdominal (+) + obese,   Peds  Hematology negative hematology ROS (+)   Anesthesia Other Findings HSV  Reproductive/Obstetrics                            Anesthesia Physical  Anesthesia Plan  ASA: 3  Anesthesia Plan: MAC and Spinal   Post-op Pain Management:  Regional for Post-op pain   Induction: Intravenous  PONV Risk Score and Plan: 4 or greater and Treatment may vary due to age or medical condition, Ondansetron and Midazolam  Airway Management Planned: Natural Airway and Nasal Cannula  Additional Equipment: None  Intra-op Plan:   Post-operative Plan:   Informed Consent: I have reviewed the patients History and Physical, chart, labs and discussed the procedure including the risks, benefits and alternatives for the proposed anesthesia with the  patient or authorized representative who has indicated his/her understanding and acceptance.     Dental advisory given  Plan Discussed with: CRNA and Anesthesiologist  Anesthesia Plan Comments:        Anesthesia Quick Evaluation

## 2021-05-31 NOTE — Evaluation (Signed)
Physical Therapy Evaluation Patient Details Name: Cristina [name] MRN: [id number] DOB: 10-17-1961 Today's Date: 05/31/2021   History of Present Illness  Pt is 60 yo female s/p L TKA on 05/31/21.  She has hx including but not limited to arthritis, CTS, DM, fibromyalgia, GERD, HLD, and sleep apnea.  Clinical Impression  Pt is s/p TKA resulting in the deficits listed below (see PT Problem List). At baseline, pt is independent and lives alone.  She plans to have her daughters stay with her during recovery.  Today, pt was limited due to pain and mild dizziness.  She transferred with min A and took some side steps at EOB.  Pt had very weak quad contraction so utilized knee immobilizer during session (did remove to test ROM which was limited to 5 to 20 degrees due to pain). Pt will benefit from skilled PT to increase their independence and safety with mobility to allow discharge to the venue listed below.      Follow Up Recommendations Follow surgeon's recommendation for DC plan and follow-up therapies;Supervision for mobility/OOB    Equipment Recommendations  Rolling walker with 5" wheels;3in1 (PT)    Recommendations for Other Services       Precautions / Restrictions Precautions Precautions: Fall Required Braces or Orthoses: Knee Immobilizer - Left Knee Immobilizer - Left: Discontinue once straight leg raise with < 10 degree lag Restrictions Weight Bearing Restrictions: Yes LLE Weight Bearing: Weight bearing as tolerated      Mobility  Bed Mobility Overal bed mobility: Needs Assistance Bed Mobility: Supine to Sit;Sit to Supine     Supine to sit: Min assist Sit to supine: Min assist   General bed mobility comments: Min A for L LE    Transfers Overall transfer level: Needs assistance Equipment used: Rolling walker (2 wheeled) Transfers: Sit to/from Stand Sit to Stand: Min assist         General transfer comment: Cues for hand placement and L LE  management  Ambulation/Gait Ambulation/Gait assistance: Min assist Gait Distance (Feet): 3 Feet Assistive device: Rolling walker (2 wheeled) Gait Pattern/deviations: Step-to pattern;Decreased stride length Gait velocity: decreased   General Gait Details: Side steps toward [facility] with KI in place.  Limited due to pain, weak quad, mild c/o dizziness, and still in PACU. Cues for sequencing, safety, and RW  Stairs            Wheelchair Mobility    Modified Rankin (Stroke Patients Only)       Balance Overall balance assessment: Needs assistance Sitting-balance support: No upper extremity supported Sitting balance-Leahy Scale: Good     Standing balance support: Bilateral upper extremity supported Standing balance-Leahy Scale: Poor Standing balance comment: requiring RW                             Pertinent Vitals/Pain Pain Assessment: 0-10 Pain Score: 5  Pain Location: L knee Pain Descriptors / Indicators: Discomfort;Restless Pain Intervention(s): Limited activity within patient's tolerance;Monitored during session;Ice applied    Home Living Family/patient expects to be discharged to:: Private residence Living Arrangements: Alone Available Help at Discharge: Family;Available 24 hours/day (daughters) Type of Home: House Home Access: Stairs to enter Entrance Stairs-Rails: Psychiatric nurse of Steps: 4 Home Layout: One level Home Equipment: Walker - 4 wheels;Cane - single point      Prior Function Level of Independence: Independent         Comments: Pt completely independent; works as Psychologist, sport and exercise  Hand Dominance        Extremity/Trunk Assessment   Upper Extremity Assessment Upper Extremity Assessment: Overall WFL for tasks assessed    Lower Extremity Assessment Lower Extremity Assessment: RLE deficits/detail;LLE deficits/detail RLE Deficits / Details: ROM WFL; MMT 5/5 LLE Deficits / Details: Expected post op/nerve  block changes.  ROM: ankle and hip WFL, knee 5 to 20 degrees limited by pain ; MMT: ankle 3/5 not further tested due to pain, hip 1/5, knee 1/5 very minimal quad contraction; unable to SLR LLE Sensation: WNL    Cervical / Trunk Assessment Cervical / Trunk Assessment: Normal  Communication   Communication: No difficulties  Cognition Arousal/Alertness: Awake/alert Behavior During Therapy: WFL for tasks assessed/performed Overall Cognitive Status: Within Functional Limits for tasks assessed                                        General Comments General comments (skin integrity, edema, etc.): VSS  Educated on no pivots, resting with leg straight, and current use of knee immobilizer. A Encouraged to perform quad sets and ankle pumps frequently for blood flow and to promote full knee extension.     Exercises     Assessment/Plan    PT Assessment Patient needs continued PT services  PT Problem List Decreased strength;Decreased mobility;Decreased safety awareness;Decreased range of motion;Decreased activity tolerance;Decreased balance;Decreased knowledge of precautions;Decreased knowledge of use of DME;Pain       PT Treatment Interventions DME instruction;Therapeutic activities;Modalities;Gait training;Therapeutic exercise;Patient/family education;Stair training;Balance training;Functional mobility training    PT Goals (Current goals can be found in the Care Plan section)  Acute Rehab PT Goals Patient Stated Goal: decrease pain; return home PT Goal Formulation: With patient Time For Goal Achievement: 06/14/21 Potential to Achieve Goals: Good    Frequency 7X/week   Barriers to discharge        Co-evaluation               AM-PAC PT "6 Clicks" Mobility  Outcome Measure Help needed turning from your back to your side while in a flat bed without using bedrails?: A Little Help needed moving from lying on your back to sitting on the side of a flat bed without  using bedrails?: A Little Help needed moving to and from a bed to a chair (including a wheelchair)?: A Little Help needed standing up from a chair using your arms (e.g., wheelchair or bedside chair)?: A Little Help needed to walk in hospital room?: A Little Help needed climbing 3-5 steps with a railing? : A Little 6 Click Score: 18    End of Session Equipment Utilized During Treatment: Gait belt;Left knee immobilizer Activity Tolerance: Patient tolerated treatment well Patient left: in bed;with call bell/phone within reach (in PACU getting ready to move to room) Nurse Communication: Mobility status PT Visit Diagnosis: Other abnormalities of gait and mobility (R26.89);Muscle weakness (generalized) (M62.81);Pain Pain - Right/Left: Left Pain - part of body: Knee    Time: 1716-1740 PT Time Calculation (min) (ACUTE ONLY): 24 min   Charges:   PT Evaluation $PT Eval Low Complexity: 1 Low PT Treatments $Gait Training: 8-22 mins        Abran Richard, PT Acute Rehab Services Pager [phone] [facility] [phone]   Karlton Lemon 05/31/2021, 5:51 PM

## 2021-05-31 NOTE — Anesthesia Procedure Notes (Signed)
Spinal  Patient location during procedure: OR Start time: 05/31/2021 10:00 AM End time: 05/31/2021 10:04 AM Reason for block: surgical anesthesia Staffing Anesthesiologist: Janeece Riggers, MD Preanesthetic Checklist Completed: patient identified, IV checked, site marked, risks and benefits discussed, surgical consent, monitors and equipment checked, pre-op evaluation and timeout performed Spinal Block Patient position: sitting Prep: DuraPrep Patient monitoring: heart rate, cardiac monitor, continuous pulse ox and blood pressure Approach: midline Location: L3-4 Injection technique: single-shot Needle Needle type: Sprotte  Needle gauge: 24 G Needle length: 9 cm Assessment Sensory level: T4 Events: CSF return

## 2021-05-31 NOTE — Brief Op Note (Signed)
05/31/2021  11:20 AM  PATIENT:  Cristina [name]  60 y.o. female  PRE-OPERATIVE DIAGNOSIS:  Osteoarthritis Left Knee  POST-OPERATIVE DIAGNOSIS:  Osteoarthritis Left Knee  PROCEDURE:  Procedure(s): LEFT TOTAL KNEE ARTHROPLASTY (Left)  SURGEON:  Surgeon(s) and Role:    Mcarthur Rossetti, MD - Primary  PHYSICIAN ASSISTANT:  Benita Stabile, PA-C   ANESTHESIA:   regional and spinal  EBL:  50 mL   COUNTS:  YES  TOURNIQUET:   Total Tourniquet Time Documented: Thigh (Left) - 40 minutes Total: Thigh (Left) - 40 minutes   DICTATION: .Other Dictation: Dictation Number [id number]  PLAN OF CARE: Admit for overnight observation  PATIENT DISPOSITION:  PACU - hemodynamically stable.   Delay start of Pharmacological VTE agent (>24hrs) due to surgical blood loss or risk of bleeding: no

## 2021-05-31 NOTE — H&P (Signed)
TOTAL KNEE ADMISSION H&P  Patient is being admitted for left total knee arthroplasty.  Subjective:  Chief Complaint:left knee pain.  HPI: Cristina [name], 60 y.o. female, has a history of pain and functional disability in the left knee due to arthritis and has failed non-surgical conservative treatments for greater than 12 weeks to includeNSAID's and/or analgesics, corticosteriod injections, viscosupplementation injections, flexibility and strengthening excercises, use of assistive devices, weight reduction as appropriate, and activity modification.  Onset of symptoms was gradual, starting 3 years ago with rapidlly worsening course since that time. The patient noted no past surgery on the left knee(s).  Patient currently rates pain in the left knee(s) at 10 out of 10 with activity. Patient has night pain, worsening of pain with activity and weight bearing, pain that interferes with activities of daily living, pain with passive range of motion, crepitus, and joint swelling.  Patient has evidence of subchondral sclerosis, periarticular osteophytes, and joint space narrowing by imaging studies. There is no active infection.  Patient Active Problem List   Diagnosis Date Noted   Unilateral primary osteoarthritis, left knee 12/21/2020   Morbid obesity with body mass index (BMI) of 45.0 to 49.9 in adult ([place]) 07/06/2018   Type 2 diabetes mellitus without complication, without long-term current use of insulin ([place]) 05/22/2017   Class 3 obesity with serious comorbidity and body mass index (BMI) of 40.0 to 44.9 in adult 05/21/2017   Family history of colon cancer 04/01/2017   Irritable bowel syndrome with diarrhea 04/01/2017   NSAID long-term use 04/01/2017   Lymphocytosis 02/24/2017   Essential hypertension 02/12/2017   Mild intermittent asthma with acute exacerbation 12/12/2016   Left shoulder pain 06/02/2015   Insomnia 05/12/2014   Fibromyalgia 05/12/2014   Allergic rhinitis 03/28/2014    Osteoarthritis 12/20/2013   Cervical strain 06/21/2013   Low back pain 06/21/2013   Metatarsalgia of both feet 03/11/2013   Sciatica 01/25/2013   Restless leg syndrome 11/24/2012   Pure hypercholesterolemia 10/20/2012   OSA on CPAP 10/20/2012   Depression 10/20/2012   Iron deficiency anemia, unspecified 10/01/2012   Snoring 10/01/2012   Diabetes mellitus, type 2 ([place]) 12/31/2010   ANAL FISSURE 12/31/2010   Morbid obesity ([place]) 01/08/2007   CONSTIPATION 01/08/2007   Past Medical History:  Diagnosis Date   Allergic rhinitis, cause unspecified    Anemia    Arthritis    Asthma    Back pain    Chest pain    Constipation    CTS (carpal tunnel syndrome)    Cystocele    Depression    Diabetes mellitus    Dysfunction of eustachian tube    Fatty liver    Fibromyalgia    Fissure, anal    Gallbladder problem    Genital herpes    GERD (gastroesophageal reflux disease)    Headache    Hemorrhoid    Hx of migraine headaches    Hyperlipidemia    IBS (irritable bowel syndrome)    Insomnia    Iron deficiency anemia, unspecified    Joint pain    Nausea    Obesity    OSA on CPAP    Osteoarthritis    Rectocele    Sleep apnea    TMJ (dislocation of temporomandibular joint)     Past Surgical History:  Procedure Laterality Date   ABDOMINAL HYSTERECTOMY  11/11/2010   [place] Plan.  Ovaries intact.  Uterine fibroids with DUB.   CARPAL TUNNEL RELEASE  11/12/1987   Left   CHOLECYSTECTOMY  CHOLECYSTECTOMY     COLONOSCOPY  06/29/2012   normal.  Repeat in 5 years.   DILATION AND CURETTAGE OF UTERUS     ECTOPIC PREGNANCY SURGERY  11/12/1991   ESOPHAGOGASTRODUODENOSCOPY  12/13/2011   dysphagia.  Henrene Pastor.  Normal.   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  CPAP titration at 12 cm water  pressure.   TUBAL LIGATION  11/11/1986    Current Facility-Administered Medications  Medication Dose Route Frequency Provider Last Rate Last Admin   ceFAZolin (ANCEF) IVPB 2g/100 mL premix  2 g Intravenous On Call to OR Pete Pelt, PA-C       lactated ringers infusion   Intravenous Continuous Hulan Fray L, MD 10 mL/hr at 05/31/21 8416 Continued from Pre-op at 05/31/21 0836   povidone-iodine 10 % swab 2 application  2 application Topical Once Erskine Emery W, PA-C       tranexamic acid (CYKLOKAPRON) IVPB 1,000 mg  1,000 mg Intravenous To OR Pete Pelt, PA-C       Facility-Administered Medications Ordered in Other Encounters  Medication Dose Route Frequency Provider Last Rate Last Admin   bupivacaine (MARCAINE) 0.5 % (with pres) injection   Infiltration Anesthesia Intra-op Janeece Riggers, MD   15 mL at 05/31/21 0906   bupivacaine liposome (EXPAREL) 1.3 % injection   Infiltration Anesthesia Intra-op Janeece Riggers, MD   10 mL at 05/31/21 0906   Allergies  Allergen Reactions   Fish Oil Rash    Social History   Tobacco Use   Smoking status: Former    Types: Cigarettes   Smokeless tobacco: Never   Tobacco comments:    smoked occasionally longest 6 mos.  Substance Use Topics   Alcohol use: No    Alcohol/week: 1.0 standard drink    Types: 1 Glasses of wine per week    Family History  Problem Relation Age of Onset   Hypertension Mother 71   Osteoarthritis Mother    Diabetes Mother    Colon cancer Mother 55   Cancer Mother 16       colon cancer   Hyperlipidemia Mother    Obesity Mother    Sudden death Mother    Arthritis Mother    Colon cancer Paternal Grandmother    Cancer Paternal Grandmother        colon cancer   Stroke Maternal Grandmother    Colon cancer Maternal Grandmother    Colon polyps Brother        x 2 brother   Diabetes Daughter    Hypertension Daughter    Sleep apnea Daughter    Mental illness Daughter        depression   Migraines  Daughter    Cirrhosis Brother        alcoholism   Alcohol abuse Brother    Migraines Daughter    Mental illness Daughter        anxiety attacks   Protein S deficiency Daughter    Sleep apnea Brother    Hyperlipidemia Brother    Migraines Brother    Obstructive Sleep Apnea Brother    Colon cancer Paternal Aunt      Review of Systems  Musculoskeletal:  Positive for gait problem and joint swelling.  All other systems reviewed and are negative.  Objective:  Physical Exam Vitals  reviewed.  Constitutional:      Appearance: Normal appearance.  HENT:     Head: Normocephalic and atraumatic.  Eyes:     Extraocular Movements: Extraocular movements intact.     Pupils: Pupils are equal, round, and reactive to light.  Cardiovascular:     Rate and Rhythm: Normal rate and regular rhythm.     Pulses: Normal pulses.  Pulmonary:     Effort: Pulmonary effort is normal.     Breath sounds: Normal breath sounds.  Abdominal:     Palpations: Abdomen is soft.  Musculoskeletal:     Cervical back: Normal range of motion and neck supple.     Left knee: Effusion, bony tenderness and crepitus present. Decreased range of motion. Tenderness present over the medial joint line, lateral joint line and patellar tendon. Abnormal alignment and abnormal meniscus.  Neurological:     Mental Status: She is alert and oriented to person, place, and time.  Psychiatric:        Behavior: Behavior normal.    Vital signs in last 24 hours: Temp:  [98.3 F (36.8 C)] 98.3 F (36.8 C) (07/21 0805) Pulse Rate:  [55] 55 (07/21 0805) Resp:  [18] 18 (07/21 0805) BP: (122)/(61) 122/61 (07/21 0805) SpO2:  [97 %] 97 % (07/21 0805) Weight:  [80.7 kg] 80.7 kg (07/21 0805)  Labs:   Estimated body mass index is 33.63 kg/m as calculated from the following:   Height as of this encounter: 5' 1"  (1.549 m).   Weight as of this encounter: 80.7 kg.   Imaging Review Plain radiographs demonstrate severe degenerative joint  disease of the left knee(s). The overall alignment ismild varus. The bone quality appears to be good for age and reported activity level.      Assessment/Plan:  End stage arthritis, left knee   The patient history, physical examination, clinical judgment of the provider and imaging studies are consistent with end stage degenerative joint disease of the left knee(s) and total knee arthroplasty is deemed medically necessary. The treatment options including medical management, injection therapy arthroscopy and arthroplasty were discussed at length. The risks and benefits of total knee arthroplasty were presented and reviewed. The risks due to aseptic loosening, infection, stiffness, patella tracking problems, thromboembolic complications and other imponderables were discussed. The patient acknowledged the explanation, agreed to proceed with the plan and consent was signed. Patient is being admitted for inpatient treatment for surgery, pain control, PT, OT, prophylactic antibiotics, VTE prophylaxis, progressive ambulation and ADL's and discharge planning. The patient is planning to be discharged home with home health services

## 2021-05-31 NOTE — Anesthesia Procedure Notes (Signed)
Anesthesia Regional Block: Adductor canal block   Pre-Anesthetic Checklist: , timeout performed,  Correct Patient, Correct Site, Correct Laterality,  Correct Procedure, Correct Position, site marked,  Risks and benefits discussed,  Surgical consent,  Pre-op evaluation,  At surgeon's request and post-op pain management  Laterality: Left  Prep: chloraprep       Needles:  Injection technique: Single-shot  Needle Type: Echogenic Stimulator Needle     Needle Length: 5cm  Needle Gauge: 22     Additional Needles:   Procedures:, nerve stimulator,,, ultrasound used (permanent image in chart),,     Nerve Stimulator or Paresthesia:  Response: quadraceps contraction, 0.45 mA  Additional Responses:   Narrative:  Start time: 05/31/2021 9:00 AM End time: 05/31/2021 9:10 AM Injection made incrementally with aspirations every 5 mL.  Performed by: Personally  Anesthesiologist: Janeece Riggers, MD  Additional Notes: Functioning IV was confirmed and monitors were applied.  A 3m 22ga Arrow echogenic stimulator needle was used. Sterile prep and drape,hand hygiene and sterile gloves were used. Ultrasound guidance: relevant anatomy identified, needle position confirmed, local anesthetic spread visualized around nerve(s)., vascular puncture avoided.  Image printed for medical record. Negative aspiration and negative test dose prior to incremental administration of local anesthetic. The patient tolerated the procedure well.

## 2021-05-31 NOTE — Anesthesia Postprocedure Evaluation (Signed)
Anesthesia Post Note  Patient: Cristina [name]  Procedure(s) Performed: LEFT TOTAL KNEE ARTHROPLASTY (Left: Knee)     Patient location during evaluation: PACU Anesthesia Type: MAC Level of consciousness: awake and alert Pain management: pain level controlled Vital Signs Assessment: post-procedure vital signs reviewed and stable Respiratory status: spontaneous breathing, nonlabored ventilation, respiratory function stable and patient connected to nasal cannula oxygen Cardiovascular status: stable and blood pressure returned to baseline Postop Assessment: no apparent nausea or vomiting Anesthetic complications: no   No notable events documented.  Last Vitals:  Vitals:   05/31/21 1700 05/31/21 1757  BP: (!) 153/80 (!) 157/74  Pulse: 60 60  Resp: 15 16  Temp: 36.4 C 36.8 C  SpO2: 97% 97%    Last Pain:  Vitals:   05/31/21 2019  TempSrc:   PainSc: 7                  [name]

## 2021-05-31 NOTE — Op Note (Signed)
NAME: Cristina [name], Cristina Ericah M. MEDICAL RECORD NO: [id number] ACCOUNT NO: [id number] DATE OF BIRTH: February 26, 1961 FACILITY: [facility] LOCATION: [facility] PHYSICIAN: Lind Guest. Ninfa Linden, MD  Operative Report   DATE OF PROCEDURE: 05/31/2021  PREOPERATIVE DIAGNOSES:  Primary osteoarthritis and degenerative joint disease, left knee.  POSTOPERATIVE DIAGNOSES:  Primary osteoarthritis and degenerative joint disease, left knee.  PROCEDURE:  Left total knee arthroplasty.  IMPLANTS:  Stryker Triathlon press fit knee system with size 2 femur, size 2 tibial tray, 11 mm fixed bearing polyethylene insert, size 29 patellar button.  SURGEON:  Lind Guest. Ninfa Linden, MD  ASSISTANT:  Erskine Emery, PA-C  ANESTHESIA: 1.  Left lower extremity adductor canal block. 2.  Spinal.  ANTIBIOTICS:  2 g IV Ancef.  TOURNIQUET TIME:  Less than 1 hour.  BLOOD LOSS:  Less than 100 mL.  COMPLICATIONS:  None.  INDICATIONS:  The patient is a 60 year old female well known to me.  She has had well documented debilitating arthritis in her left knee that has been getting worse for really around 4-5 years now.  We have recommended total knee arthroplasty.  She is a  diabetic and her blood glucoses used to be under poor control and we could not proceed with surgery until she had better blood glucose control.  Her recent hemoglobin A1c was below 7, and we felt comfortable proceeding with surgery at this standpoint.   Her left knee pain is daily.  She has varus malalignment of that knee with bone-on-bone wear on the x-rays.  Her knee pain is detrimentally affecting her mobility, her quality of life and activities of daily living to the point she does wish to proceed  with total knee arthroplasty and we have recommended that as well.  We talked to her in length and detail about the risk of acute blood loss anemia, nerve and vessel injury, fracture, infection, DVT, implant failure and skin and soft tissue issues.  We  talked about her  still staying under as much good blood glucose control as she can.  We talked about our goals being decrease pain, improve mobility and overall improve quality of life.  DESCRIPTION OF PROCEDURE:  After informed consent was obtained, appropriate left knee was marked.  An adductor canal block was obtained in the left lower extremity in the holding room.  She was then brought to the operating room, sat up on the operating  table.  Spinal anesthesia was obtained.  She was laid in the supine position on the operating table.  Foley catheter was placed and a nonsterile tourniquet was placed around the upper left thigh.  Her left thigh, knee, leg, ankle and foot were prepped  and draped with DuraPrep and sterile drapes.  A timeout was called, and she was identified as correct patient, correct left knee.  We then used the Esmarch to wrap out the leg and tourniquet was inflated to 300 mm pressure.  We then made a direct midline  incision over the patella and carried this proximally and distally.  We dissected down the knee joint, carried out a medial parapatellar arthrotomy, finding a very large joint effusion and significant osteophytes throughout the knee.  There was  significant cartilage wear.  We removed remnants of ACL, PCL, medial and lateral meniscus and all the osteophytes we could see around the knee.  With the knee in a flexed position, we used our extramedullary cutting guide for taking 9 mm off the high  side of the proximal tibia, correcting for varus and valgus and  neutral slope.  We made this cut without difficulty.  We then went to the femur, used the intramedullary guide for making our distal femoral cut, setting this for a left knee at 5 degrees  externally rotated.  We did this for an 8 mm distal femoral cut.  We made this cut without difficulty and brought the knee back down to full extension and almost achieved full extension with a 9 mm extension block.  We then went back to the femur and put   our femoral sizing guide based off the epicondylar axis and Whiteside's line.  Based off of this, we chose a size 2 femur.  We put a 4-in-1 cutting block for a size 2 femur, made our anterior and posterior cuts, followed by our chamfer cuts.  We then  made our femoral box cut.  Attention was then turned back to the tibia, we chose a size 2 tibial tray for coverage, setting the rotation off the tibial tubercle and the femur.  We did our keel punch off of this.  With size 2 tibia, we trialed a size 2  left femur and a 9 mm fixed bearing polyethylene insert and then went up to 11 mm and that gave [name] the best stability with varus and valgus stressing and still good range of motion and full extension.  We then made our patellar cut and drilled three  holes for size 29 press-fit patellar button.  We then removed all instrumentation from the knee and irrigated the knee with normal saline solution using pulsatile lavage.  We dried the knee real well and then with the knee in a flexed position, we placed  our press-fit Stryker tibial tray size 2, followed by our size 2 left press-fit femur.  We placed our real 11 mm fixed bearing polyethylene insert and press fit our size 29 patellar button.  We then put the knee through range of motion cycles again and  we were pleased with range of motion and stability.  We let the tourniquet down.  Hemostasis was obtained with electrocautery.  We then closed the arthrotomy with interrupted #1 Vicryl suture followed by 0 Vicryl to close the deep tissue and 2-0 Vicryl  to close the subcutaneous tissue.  The skin was closed with staples.  Xeroform and well-padded sterile dressing was applied.  She was taken to recovery room in stable condition with all final counts being correct.  No complications noted.  Of note, Cristina Stabile, PA-C, did assist during the entire case and assistance was crucial for facilitating all aspects of this case.   SHW D: 05/31/2021 11:18:14 am T: 05/31/2021  12:23:00 pm  JOB: [id number]/ [id number]

## 2021-06-01 ENCOUNTER — Encounter (HOSPITAL_COMMUNITY): Payer: Self-pay | Admitting: Orthopaedic Surgery

## 2021-06-01 DIAGNOSIS — K219 Gastro-esophageal reflux disease without esophagitis: Secondary | ICD-10-CM | POA: Diagnosis present

## 2021-06-01 DIAGNOSIS — Z83438 Family history of other disorder of lipoprotein metabolism and other lipidemia: Secondary | ICD-10-CM | POA: Diagnosis not present

## 2021-06-01 DIAGNOSIS — M797 Fibromyalgia: Secondary | ICD-10-CM | POA: Diagnosis present

## 2021-06-01 DIAGNOSIS — G2581 Restless legs syndrome: Secondary | ICD-10-CM | POA: Diagnosis present

## 2021-06-01 DIAGNOSIS — Z20822 Contact with and (suspected) exposure to covid-19: Secondary | ICD-10-CM | POA: Diagnosis present

## 2021-06-01 DIAGNOSIS — E7800 Pure hypercholesterolemia, unspecified: Secondary | ICD-10-CM | POA: Diagnosis present

## 2021-06-01 DIAGNOSIS — Z833 Family history of diabetes mellitus: Secondary | ICD-10-CM | POA: Diagnosis not present

## 2021-06-01 DIAGNOSIS — K760 Fatty (change of) liver, not elsewhere classified: Secondary | ICD-10-CM | POA: Diagnosis present

## 2021-06-01 DIAGNOSIS — Z9049 Acquired absence of other specified parts of digestive tract: Secondary | ICD-10-CM | POA: Diagnosis not present

## 2021-06-01 DIAGNOSIS — G4733 Obstructive sleep apnea (adult) (pediatric): Secondary | ICD-10-CM | POA: Diagnosis present

## 2021-06-01 DIAGNOSIS — Z823 Family history of stroke: Secondary | ICD-10-CM | POA: Diagnosis not present

## 2021-06-01 DIAGNOSIS — I10 Essential (primary) hypertension: Secondary | ICD-10-CM | POA: Diagnosis present

## 2021-06-01 DIAGNOSIS — J45909 Unspecified asthma, uncomplicated: Secondary | ICD-10-CM | POA: Diagnosis present

## 2021-06-01 DIAGNOSIS — Z800 Family history of malignant neoplasm of digestive organs: Secondary | ICD-10-CM | POA: Diagnosis not present

## 2021-06-01 DIAGNOSIS — Z96652 Presence of left artificial knee joint: Secondary | ICD-10-CM

## 2021-06-01 DIAGNOSIS — Z6833 Body mass index (BMI) 33.0-33.9, adult: Secondary | ICD-10-CM | POA: Diagnosis not present

## 2021-06-01 DIAGNOSIS — M1712 Unilateral primary osteoarthritis, left knee: Secondary | ICD-10-CM | POA: Diagnosis present

## 2021-06-01 DIAGNOSIS — Z8261 Family history of arthritis: Secondary | ICD-10-CM | POA: Diagnosis not present

## 2021-06-01 DIAGNOSIS — Z8371 Family history of colonic polyps: Secondary | ICD-10-CM | POA: Diagnosis not present

## 2021-06-01 DIAGNOSIS — Z811 Family history of alcohol abuse and dependence: Secondary | ICD-10-CM | POA: Diagnosis not present

## 2021-06-01 DIAGNOSIS — Z87891 Personal history of nicotine dependence: Secondary | ICD-10-CM | POA: Diagnosis not present

## 2021-06-01 DIAGNOSIS — Z8249 Family history of ischemic heart disease and other diseases of the circulatory system: Secondary | ICD-10-CM | POA: Diagnosis not present

## 2021-06-01 DIAGNOSIS — Z90710 Acquired absence of both cervix and uterus: Secondary | ICD-10-CM | POA: Diagnosis not present

## 2021-06-01 DIAGNOSIS — E119 Type 2 diabetes mellitus without complications: Secondary | ICD-10-CM | POA: Diagnosis present

## 2021-06-01 LAB — BASIC METABOLIC PANEL
Anion gap: 8 (ref 5–15)
BUN: 8 mg/dL (ref 6–20)
CO2: 25 mmol/L (ref 22–32)
Calcium: 8.5 mg/dL — ABNORMAL LOW (ref 8.9–10.3)
Chloride: 102 mmol/L (ref 98–111)
Creatinine, Ser: 0.69 mg/dL (ref 0.44–1.00)
GFR, Estimated: >60 mL/min (ref >60)
Glucose, Bld: 169 mg/dL — ABNORMAL HIGH (ref 70–99)
Potassium: 3.6 mmol/L (ref 3.5–5.1)
Sodium: 135 mmol/L (ref 135–145)

## 2021-06-01 LAB — CBC
HCT: 37.1 % (ref 36.0–46.0)
Hemoglobin: 12.3 g/dL (ref 12.0–15.0)
MCH: 29.7 pg (ref 26.0–34.0)
MCHC: 33.2 g/dL (ref 30.0–36.0)
MCV: 89.6 fL (ref 80.0–100.0)
Platelets: 211 10*3/uL (ref 150–400)
RBC: 4.14 MIL/uL (ref 3.87–5.11)
RDW: 13.6 % (ref 11.5–15.5)
WBC: 13.3 10*3/uL — ABNORMAL HIGH (ref 4.0–10.5)
nRBC: 0 % (ref 0.0–0.2)

## 2021-06-01 LAB — GLUCOSE, CAPILLARY
Glucose-Capillary: 161 mg/dL — ABNORMAL HIGH (ref 70–99)
Glucose-Capillary: 145 mg/dL — ABNORMAL HIGH (ref 70–99)
Glucose-Capillary: 164 mg/dL — ABNORMAL HIGH (ref 70–99)

## 2021-06-01 MED ORDER — METHOCARBAMOL 500 MG PO TABS: 500.0000 mg | ORAL_TABLET | Freq: Four times a day (QID) | ORAL | 0 refills | Status: DC | PRN

## 2021-06-01 MED ORDER — ASPIRIN 81 MG PO CHEW: 81.0000 mg | CHEWABLE_TABLET | Freq: Two times a day (BID) | ORAL | 0 refills | Status: DC

## 2021-06-01 MED ORDER — OXYCODONE HCL 5 MG PO TABS: 5.0000 mg | ORAL_TABLET | Freq: Four times a day (QID) | ORAL | 0 refills | Status: DC | PRN

## 2021-06-01 NOTE — Progress Notes (Signed)
Physical Therapy Treatment Patient Details Name: Cristina [name] MRN: [id number] DOB: 1961/06/24 Today's Date: 06/01/2021    History of Present Illness Pt is 60 yo female s/p L TKA on 05/31/21.  She has hx including but not limited to arthritis, CTS, DM, fibromyalgia, GERD, HLD, and sleep apnea.    PT Comments    Pt demonstrating gradual progress, but does need further therapy prior to d/c home.  She continues to have very weak quad contraction, unable to SLR, so utilizing [place] when [place].  Pt lacking 8 degrees extension and motivated to improved - asked and was educated on stretches, positions, and exercises to assist in promoting full extension. She did ambulate 48' with RW and min guard.  Continue plan of care.     Follow Up Recommendations  Follow surgeon's recommendation for DC plan and follow-up therapies;Supervision for mobility/OOB     Equipment Recommendations  Rolling walker with 5" wheels;3in1 (PT)    Recommendations for Other Services       Precautions / Restrictions Precautions Precautions: Fall Required Braces or Orthoses: Knee Immobilizer - Left Knee Immobilizer - Left: Discontinue once straight leg raise with < 10 degree lag Restrictions Weight Bearing Restrictions: Yes LLE Weight Bearing: Weight bearing as tolerated    Mobility  Bed Mobility Overal bed mobility: Needs Assistance Bed Mobility: Supine to Sit     Supine to sit: Min assist     General bed mobility comments: Min A for L LE    Transfers Overall transfer level: Needs assistance Equipment used: Rolling walker (2 wheeled) Transfers: Sit to/from Stand Sit to Stand: Min assist         General transfer comment: Cues for hand placement and L LE management  Ambulation/Gait Ambulation/Gait assistance: Min guard Gait Distance (Feet): 35 Feet (12' then 35') Assistive device: Rolling walker (2 wheeled) Gait Pattern/deviations: Step-to pattern;Decreased stride length Gait velocity: decreased    General Gait Details: Step to pattern with KI in place; edcuated on RW use; Min guard for Barrister's clerk    Modified Rankin (Stroke Patients Only)       Balance Overall balance assessment: Needs assistance Sitting-balance support: No upper extremity supported Sitting balance-Leahy Scale: Good     Standing balance support: Bilateral upper extremity supported Standing balance-Leahy Scale: Poor Standing balance comment: requiring RW                            Cognition Arousal/Alertness: Awake/alert Behavior During Therapy: WFL for tasks assessed/performed Overall Cognitive Status: Within Functional Limits for tasks assessed                                        Exercises Total Joint Exercises Ankle Circles/Pumps: AROM;Both;10 reps;Supine Quad Sets: AROM;Left;Supine;15 reps (with therapist elevating foot to provide hamstring stretch) Hip ABduction/ADduction: AAROM;Left;10 reps;Supine Long Arc Quad:  (unable) Knee Flexion: AAROM;Left;5 reps;Seated (Had pt sliide foot back, hip hiked as needed but then relaxed hip down for stretch into knee flexion, 10 sec holds) Goniometric ROM: L knee 8 to 30 degrees    General Comments General comments (skin integrity, edema, etc.): VSS.  Pt continues to have weak quad contraction so utilized KI during session.  Knee ROM 8-30 degrees limited by tightness on extension and pain with flexion.  Encouraged to continue to work on straightening knee via quad sets, resting with leg straight, and hamstring stretch as able 5-10 mins at a time with blanket roll under ankle.      Pertinent Vitals/Pain Pain Assessment: 0-10 Pain Score: 4  (4/10 rest increased with activity but did not rate) Pain Location: L knee Pain Descriptors / Indicators: Discomfort;Guarding;Grimacing Pain Intervention(s): Limited activity within patient's tolerance;Monitored during session;Premedicated before  session;Ice applied;Repositioned    Home Living                      Prior Function            PT Goals (current goals can now be found in the care plan section) Acute Rehab PT Goals Patient Stated Goal: decrease pain; return home PT Goal Formulation: With patient Time For Goal Achievement: 06/14/21 Potential to Achieve Goals: Good Progress towards PT goals: Progressing toward goals    Frequency    7X/week      PT Plan Current plan remains appropriate    Co-evaluation              AM-PAC PT "6 Clicks" Mobility   Outcome Measure  Help needed turning from your back to your side while in a flat bed without using bedrails?: None Help needed moving from lying on your back to sitting on the side of a flat bed without using bedrails?: A Little Help needed moving to and from a bed to a chair (including a wheelchair)?: A Little Help needed standing up from a chair using your arms (e.g., wheelchair or bedside chair)?: A Little Help needed to walk in hospital room?: A Little Help needed climbing 3-5 steps with a railing? : A Little 6 Click Score: 19    End of Session Equipment Utilized During Treatment: Gait belt;Left knee immobilizer Activity Tolerance: Patient tolerated treatment well Patient left: in chair;with family/visitor present;with call bell/phone within reach Nurse Communication: Mobility status PT Visit Diagnosis: Other abnormalities of gait and mobility (R26.89);Muscle weakness (generalized) (M62.81);Pain Pain - Right/Left: Left Pain - part of body: Knee     Time: 0926-0952 PT Time Calculation (min) (ACUTE ONLY): 26 min  Charges:  $Gait Training: 8-22 mins $Therapeutic Exercise: 8-22 mins                     Abran Richard, PT Acute Rehab Services Pager [phone] [facility] [place] 06/01/2021, 10:39 AM

## 2021-06-01 NOTE — Progress Notes (Signed)
Physical Therapy Treatment Patient Details Name: Cristina [name] MRN: [id number] DOB: [birth year] Today's Date: 06/01/2021    History of Present Illness Pt is 60 yo female s/p L TKA on 05/31/21.  She has hx including but not limited to arthritis, CTS, DM, fibromyalgia, GERD, HLD, and sleep apnea.    PT Comments    Pt continues to have weak quad contraction. Unable to perform stairs due to knee buckling-did attempt without KI as pt was ambulating without KI and stable.  Needs further therapy prior to d/c home.      Follow Up Recommendations  Follow surgeon's recommendation for DC plan and follow-up therapies;Supervision for mobility/OOB     Equipment Recommendations  Rolling walker with 5" wheels;3in1 (PT)    Recommendations for Other Services       Precautions / Restrictions Precautions Precautions: Fall Required Braces or Orthoses: Knee Immobilizer - Left Knee Immobilizer - Left: Discontinue once straight leg raise with < 10 degree lag Restrictions LLE Weight Bearing: Weight bearing as tolerated    Mobility  Bed Mobility Overal bed mobility: Needs Assistance Bed Mobility: Supine to Sit;Sit to Supine     Supine to sit: Min assist Sit to supine: Min assist   General bed mobility comments: Min A for L LE    Transfers Overall transfer level: Needs assistance Equipment used: Rolling walker (2 wheeled) Transfers: Sit to/from Stand Sit to Stand: Min assist         General transfer comment: Cues for hand placement and L LE management; performed x2  Ambulation/Gait Ambulation/Gait assistance: Min guard Gait Distance (Feet): 70 Feet (70'x2) Assistive device: Rolling walker (2 wheeled) Gait Pattern/deviations: Step-to pattern;Decreased stride length Gait velocity: decreased   General Gait Details: Step to pattern with seated rest break.  Did not use KI and pt able to compensate with step to pattern and RW   Stairs Stairs: Yes Stairs assistance: Mod assist Stair  Management: One rail Left;Step to pattern;Sideways Number of Stairs: 2 General stair comments: Attempted stairs sideways but pt's knee buckling on second step requiring blocking and mod A to return down   Wheelchair Mobility    Modified Rankin (Stroke Patients Only)       Balance Overall balance assessment: Needs assistance Sitting-balance support: No upper extremity supported Sitting balance-Leahy Scale: Good     Standing balance support: Bilateral upper extremity supported Standing balance-Leahy Scale: Poor Standing balance comment: requiring RW                            Cognition Arousal/Alertness: Awake/alert Behavior During Therapy: WFL for tasks assessed/performed Overall Cognitive Status: Within Functional Limits for tasks assessed                                        Exercises Total Joint Exercises Ankle Circles/Pumps: AROM;Both;10 reps;Supine Quad Sets: AROM;Left;Supine;15 reps (with therapist elevating foot for hamstring stretch) Hip ABduction/ADduction: AAROM;Left;10 reps;Supine Long Arc Quad:  (unable) Knee Flexion: AAROM;Left;5 reps;Seated (Had pt sliide foot back, hip hiked as needed but then relaxed hip down for stretch into knee flexion, 10 sec holds) Goniometric ROM: L knee 8 to 30 degrees    General Comments General comments (skin integrity, edema, etc.): VSS.  Pt continues to have weak quad contraction so utilized KI during session.  Knee ROM 8-30 degrees limited by tightness on extension and pain with  flexion.  Encouraged to continue to work on straightening knee via quad sets, resting with leg straight, and hamstring stretch as able 5-10 mins at a time with blanket roll under ankle.      Pertinent Vitals/Pain Pain Assessment: 0-10 Pain Score: 6  Pain Location: L knee Pain Descriptors / Indicators: Discomfort;Guarding;Grimacing Pain Intervention(s): Limited activity within patient's tolerance;Monitored during  session;Premedicated before session;Ice applied    Home Living                      Prior Function            PT Goals (current goals can now be found in the care plan section) Acute Rehab PT Goals Patient Stated Goal: decrease pain; return home PT Goal Formulation: With patient Time For Goal Achievement: 06/14/21 Potential to Achieve Goals: Good Progress towards PT goals: Progressing toward goals    Frequency    7X/week      PT Plan Current plan remains appropriate    Co-evaluation              AM-PAC PT "6 Clicks" Mobility   Outcome Measure  Help needed turning from your back to your side while in a flat bed without using bedrails?: None Help needed moving from lying on your back to sitting on the side of a flat bed without using bedrails?: A Little Help needed moving to and from a bed to a chair (including a wheelchair)?: A Little Help needed standing up from a chair using your arms (e.g., wheelchair or bedside chair)?: A Little Help needed to walk in hospital room?: A Little Help needed climbing 3-5 steps with a railing? : A Lot 6 Click Score: 18    End of Session Equipment Utilized During Treatment: Gait belt;Left knee immobilizer Activity Tolerance: Patient tolerated treatment well Patient left: in chair;with family/visitor present;with call bell/phone within reach Nurse Communication: Mobility status PT Visit Diagnosis: Other abnormalities of gait and mobility (R26.89);Muscle weakness (generalized) (M62.81);Pain Pain - Right/Left: Left Pain - part of body: Knee     Time: 5732-2025 PT Time Calculation (min) (ACUTE ONLY): 26 min  Charges:  $Gait Training: 8-22 mins $Therapeutic Exercise: 8-22 mins                     Abran Richard, PT Acute Rehab Services Pager [phone] [facility] 06/01/2021, 1:38 PM

## 2021-06-01 NOTE — Progress Notes (Signed)
Subjective: 1 Day Post-Op Procedure(s) (LRB): LEFT TOTAL KNEE ARTHROPLASTY (Left) Patient reports pain as moderate.    Objective: Vital signs in last 24 hours: Temp:  [97.3 F (36.3 C)-99.7 F (37.6 C)] 99.7 F (37.6 C) (07/22 0446) Pulse Rate:  [55-97] 97 (07/22 0446) Resp:  [13-27] 15 (07/22 0446) BP: (109-161)/(60-85) 135/80 (07/22 0446) SpO2:  [94 %-99 %] 98 % (07/22 0446) Weight:  [80.7 kg] 80.7 kg (07/21 0805)  Intake/Output from previous day: 07/21 0701 - 07/22 0700 In: 1501 [I.V.:1201; IV Piggyback:300] Out: 1900 [Urine:1850; Blood:50] Intake/Output this shift: Total I/O In: 100 [IV Piggyback:100] Out: -   Recent Labs    06/01/21 0447  HGB 12.3   Recent Labs    06/01/21 0447  WBC 13.3*  RBC 4.14  HCT 37.1  PLT 211   Recent Labs    06/01/21 0447  NA 135  K 3.6  CL 102  CO2 25  BUN 8  CREATININE 0.69  GLUCOSE 169*  CALCIUM 8.5*   No results for input(s): LABPT, INR in the last 72 hours.  Sensation intact distally Intact pulses distally Dorsiflexion/Plantar flexion intact Incision: dressing C/D/I No cellulitis present Compartment soft   Assessment/Plan: 1 Day Post-Op Procedure(s) (LRB): LEFT TOTAL KNEE ARTHROPLASTY (Left) Up with therapy Discharge depending on pain control and how she does with therapy.     Mcarthur Rossetti 06/01/2021, 6:39 AM

## 2021-06-01 NOTE — Progress Notes (Signed)
Patient ID: Cristina [name], female   DOB: 12-24-1960, 60 y.o.   MRN: [id number] Needs to stay today for extra therapy tomorrow and for pain control.  Can be discharge to home tomorrow (Sat).

## 2021-06-01 NOTE — Discharge Instructions (Signed)

## 2021-06-02 LAB — GLUCOSE, CAPILLARY
Glucose-Capillary: 328 mg/dL — ABNORMAL HIGH (ref 70–99)
Glucose-Capillary: 141 mg/dL — ABNORMAL HIGH (ref 70–99)

## 2021-06-02 NOTE — Plan of Care (Signed)

## 2021-06-02 NOTE — TOC Transition Note (Signed)
Transition of Care [facility]) - CM/SW Discharge Note   Patient Details  Name: Cristina [name] MRN: [id number] Date of Birth: 09/19/1961  [facility]) CM/SW Contact:  Bartholomew Crews, RN Phone Number: [phone] 06/02/2021, 12:01 PM   Clinical Narrative:     Spoke with patient at the bedside. Confirmed delivery of DME 3-N-1 and RW, which was arranged by previous CM yesterday. HH PT arranged with [facility] yesterday. Patient stated that she will discharge home after PT session today. No further TOC needs identified.   Final next level of care: [place] Barriers to Discharge: No Barriers Identified   Patient Goals and CMS Choice Patient states their goals for this hospitalization and ongoing recovery are:: return home today after PT CMS Medicare.gov Compare Post Acute Care list provided to:: Patient Choice offered to / list presented to : Patient  Discharge Placement                       Discharge Plan and Services                DME Arranged: 3-N-1, Walker rolling   Date DME Agency Contacted: 06/01/21 Time DME Agency Contacted: 0931 Representative spoke with at DME Agency: Freda Munro HH Arranged: PT [place]: [address] Date [place]: 06/01/21 Time [place]: 1307 Representative spoke with at [place]: [place] ([place]) Interventions     Readmission Risk Interventions No flowsheet data found.

## 2021-06-02 NOTE — Progress Notes (Signed)
  Subjective: Pt stable - pain ok - pt wants to go home today   Objective: Vital signs in last 24 hours: Temp:  [98.7 F (37.1 C)-100.2 F (37.9 C)] 98.7 F (37.1 C) (07/23 0822) Pulse Rate:  [85-100] 85 (07/23 0822) Resp:  [17-18] 17 (07/23 0822) BP: (121-130)/(70-81) 126/81 (07/23 0822) SpO2:  [94 %-100 %] 94 % (07/23 0822)  Intake/Output from previous day: No intake/output data recorded. Intake/Output this shift: No intake/output data recorded.  Exam:  Sensation intact distally Intact pulses distally Dorsiflexion/Plantar flexion intact  Labs: Recent Labs    06/01/21 0447  HGB 12.3   Recent Labs    06/01/21 0447  WBC 13.3*  RBC 4.14  HCT 37.1  PLT 211   Recent Labs    06/01/21 0447  NA 135  K 3.6  CL 102  CO2 25  BUN 8  CREATININE 0.69  GLUCOSE 169*  CALCIUM 8.5*   No results for input(s): LABPT, INR in the last 72 hours.  Assessment/Plan: Plan for dc today after PT with more stair training   G Alphonzo Severance 06/02/2021, 8:35 AM

## 2021-06-02 NOTE — Progress Notes (Signed)
Physical Therapy Treatment Patient Details Name: Cristina [name] MRN: [id number] DOB: Jul 20, 1961 Today's Date: 06/02/2021    History of Present Illness Pt is 60 yo female s/p L TKA on 05/31/21.  She has hx including but not limited to arthritis, CTS, DM, fibromyalgia, GERD, HLD, and sleep apnea.    PT Comments    Patient progressing towards physical therapy goals. Patient functioning at supervision level with RW and L knee immobilizer for support. Able to negotiate 2 stairs sideways safely with supervision and L handrail. Educated patient on proper positioning and importance of mobility and HEP, patient verbalized understanding. D/c plan remains appropriate.     Follow Up Recommendations  Follow surgeon's recommendation for DC plan and follow-up therapies;Supervision for mobility/OOB     Equipment Recommendations  Rolling Clare Fennimore with 5" wheels;3in1 (PT)    Recommendations for Other Services       Precautions / Restrictions Precautions Precautions: Fall Required Braces or Orthoses: Knee Immobilizer - Left Knee Immobilizer - Left: Discontinue once straight leg raise with < 10 degree lag Restrictions Weight Bearing Restrictions: Yes LLE Weight Bearing: Weight bearing as tolerated    Mobility  Bed Mobility Overal bed mobility: Needs Assistance Bed Mobility: Supine to Sit;Sit to Supine     Supine to sit: Supervision Sit to supine: Supervision   General bed mobility comments: supervision for safety. Patient utilizing UEs to lift L LE on/off bed due to weakness    Transfers Overall transfer level: Needs assistance Equipment used: Rolling Decarla Siemen (2 wheeled) Transfers: Sit to/from Stand Sit to Stand: Supervision         General transfer comment: supervision for safety  Ambulation/Gait Ambulation/Gait assistance: Supervision Gait Distance (Feet): 70 Feet Assistive device: Rolling Kobie Whidby (2 wheeled) Gait Pattern/deviations: Step-to pattern;Decreased stride length Gait  velocity: decreased   General Gait Details: step to pattern with supervision for safety. Patient requested use of KI for ambulation   Stairs Stairs: Yes Stairs assistance: Supervision Stair Management: One rail Left;Step to pattern;Sideways Number of Stairs: 2 General stair comments: supervision for safety with ascending and descending sideways. No buckling noted   Wheelchair Mobility    Modified Rankin (Stroke Patients Only)       Balance Overall balance assessment: Needs assistance Sitting-balance support: No upper extremity supported Sitting balance-Leahy Scale: Good     Standing balance support: Bilateral upper extremity supported Standing balance-Leahy Scale: Poor Standing balance comment: requiring RW                            Cognition Arousal/Alertness: Awake/alert Behavior During Therapy: WFL for tasks assessed/performed Overall Cognitive Status: Within Functional Limits for tasks assessed                                        Exercises      General Comments        Pertinent Vitals/Pain Pain Assessment: Faces Faces Pain Scale: Hurts little more Pain Location: L knee Pain Descriptors / Indicators: Discomfort;Guarding;Grimacing Pain Intervention(s): Monitored during session;Repositioned    Home Living                      Prior Function            PT Goals (current goals can now be found in the care plan section) Acute Rehab PT Goals Patient Stated Goal: to go  home PT Goal Formulation: With patient Time For Goal Achievement: 06/14/21 Potential to Achieve Goals: Good Progress towards PT goals: Progressing toward goals    Frequency    7X/week      PT Plan Current plan remains appropriate    Co-evaluation              AM-PAC PT "6 Clicks" Mobility   Outcome Measure  Help needed turning from your back to your side while in a flat bed without using bedrails?: None Help needed moving from  lying on your back to sitting on the side of a flat bed without using bedrails?: A Little Help needed moving to and from a bed to a chair (including a wheelchair)?: A Little Help needed standing up from a chair using your arms (e.g., wheelchair or bedside chair)?: A Little Help needed to walk in hospital room?: A Little Help needed climbing 3-5 steps with a railing? : A Little 6 Click Score: 19    End of Session Equipment Utilized During Treatment: Gait belt;Left knee immobilizer Activity Tolerance: Patient tolerated treatment well Patient left: in bed;with call bell/phone within reach;with bed alarm set Nurse Communication: Mobility status PT Visit Diagnosis: Other abnormalities of gait and mobility (R26.89);Muscle weakness (generalized) (M62.81);Pain Pain - Right/Left: Left Pain - part of body: Knee     Time: 7124-5809 PT Time Calculation (min) (ACUTE ONLY): 19 min  Charges:  $Gait Training: 8-22 mins                     Verenise Moulin A. Gilford Rile PT, DPT Acute Rehabilitation Services Pager [phone] Office [phone]    Linna Hoff 06/02/2021, 1:10 PM

## 2021-06-02 NOTE — Progress Notes (Signed)
Discharge paperwork and instructions given to pt. Pt not in distress and tolerated well. 

## 2021-06-04 ENCOUNTER — Encounter: Payer: Self-pay | Admitting: *Deleted

## 2021-06-04 ENCOUNTER — Other Ambulatory Visit: Payer: Self-pay | Admitting: *Deleted

## 2021-06-04 DIAGNOSIS — G4700 Insomnia, unspecified: Secondary | ICD-10-CM | POA: Diagnosis not present

## 2021-06-04 DIAGNOSIS — K580 Irritable bowel syndrome with diarrhea: Secondary | ICD-10-CM | POA: Diagnosis not present

## 2021-06-04 DIAGNOSIS — I10 Essential (primary) hypertension: Secondary | ICD-10-CM | POA: Diagnosis not present

## 2021-06-04 DIAGNOSIS — D72820 Lymphocytosis (symptomatic): Secondary | ICD-10-CM | POA: Diagnosis not present

## 2021-06-04 DIAGNOSIS — F32A Depression, unspecified: Secondary | ICD-10-CM | POA: Diagnosis not present

## 2021-06-04 DIAGNOSIS — E7800 Pure hypercholesterolemia, unspecified: Secondary | ICD-10-CM | POA: Diagnosis not present

## 2021-06-04 DIAGNOSIS — Z471 Aftercare following joint replacement surgery: Secondary | ICD-10-CM | POA: Diagnosis not present

## 2021-06-04 DIAGNOSIS — E119 Type 2 diabetes mellitus without complications: Secondary | ICD-10-CM | POA: Diagnosis not present

## 2021-06-04 NOTE — Patient Outreach (Signed)
[facility]) Care Management  06/04/2021  Cristina [name] [birth year] [id number]    Transition of care call/case closure   Referral received:05/31/21 Initial outreach:06/04/21 Insurance: [employer]    Subjective: Initial successful telephone call to patient's preferred number in order to complete transition of care assessment; 2 HIPAA identifiers verified. Explained purpose of call and completed transition of care assessment.  Cristina [name] states that she is doing okay. She denies post-operative problems, says surgical dressing in place no signs of infection, states surgical pain well managed with prescribed medications and using ice pack. She reports tolerating diet, denies bladder problems she reports not having a bowel movement since being home. Reviewed measures of addressing constipation discussed foods to may help, reviewed discharge instructions. Patient daughter and grandson assisting with her  recovery. Patient discussed having home therapy visit on today and using rolling walker for mobility .   Reviewed accessing the following [place] Benefits : She discussed ongoing health issues, Diabetes, Hypertension  and says she is enrolled in one of the [place] chronic disease management programs.  She says that she is unsure if she has the hospital indemnity plan, provided contact number to [employer] to file a claim in needed.  She uses a [facility] outpatient pharmacy at [facility] outpatient pharmacy.       Objective:  Cristina [name] was hospitalized at [facility] 7/21-7/23/22  for left TKA on 7/21 Comorbidities include:  Diabetes, A1c 6.8%, fibromyalgia, GERD, Hypertension . She  was discharged to home on 06/02/21 with [place] home  physical therapy and DME of rolling walker and 3in1.    Assessment:  Patient voices good understanding of all discharge instructions.  See transition of care flowsheet for assessment details.   Plan:  Reviewed hospital discharge  diagnosis of  Left TKA and discharge treatment plan using hospital discharge instructions, assessing medication adherence, reviewing problems requiring provider notification, and discussing the importance of follow up with surgeon, primary care provider and/or specialists as directed.  Reviewed [place] healthy lifestyle program information to receive discounted premium for  2023   Step 1: Get  your annual physical  Step 2: Complete your health assessment  Step 3:Identify your current health status and complete the corresponding action step between November 11, 2020 and July 12, 2021.    Using [place] website, verified that patient is an active participate in [place]'s [employer] chronic disease management program.    No ongoing care management needs identified so will close case to [place] [employer] services and route successful outreach letter with [place] [employer] pamphlet and 24 Hour Nurse Line Magnet to [place] [employer] clinical pool to be mailed to patient's home address.  Thanked patient for their services to [facility].  Joylene Draft, RN, BSN  [place] Management Coordinator  [phone]- Mobile [phone]- Toll Free Main Office

## 2021-06-06 DIAGNOSIS — F32A Depression, unspecified: Secondary | ICD-10-CM | POA: Diagnosis not present

## 2021-06-06 DIAGNOSIS — G4700 Insomnia, unspecified: Secondary | ICD-10-CM | POA: Diagnosis not present

## 2021-06-06 DIAGNOSIS — Z471 Aftercare following joint replacement surgery: Secondary | ICD-10-CM | POA: Diagnosis not present

## 2021-06-06 DIAGNOSIS — D72820 Lymphocytosis (symptomatic): Secondary | ICD-10-CM | POA: Diagnosis not present

## 2021-06-06 DIAGNOSIS — E119 Type 2 diabetes mellitus without complications: Secondary | ICD-10-CM | POA: Diagnosis not present

## 2021-06-06 DIAGNOSIS — E7800 Pure hypercholesterolemia, unspecified: Secondary | ICD-10-CM | POA: Diagnosis not present

## 2021-06-06 DIAGNOSIS — I10 Essential (primary) hypertension: Secondary | ICD-10-CM | POA: Diagnosis not present

## 2021-06-06 DIAGNOSIS — K580 Irritable bowel syndrome with diarrhea: Secondary | ICD-10-CM | POA: Diagnosis not present

## 2021-06-06 NOTE — Discharge Summary (Signed)
Patient ID: ADDELYN ALLEMAN MRN: [id number] DOB/AGE: [id number] y.o.  Admit date: 05/31/2021 Discharge date: 06/02/21  Admission Diagnoses:  Principal Problem:   Unilateral primary osteoarthritis, left knee Active Problems:   Status post total left knee replacement   Status post left knee replacement   Discharge Diagnoses:  Same  Past Medical History:  Diagnosis Date   Allergic rhinitis, cause unspecified    Anemia    Arthritis    Asthma    Back pain    Chest pain    Constipation    CTS (carpal tunnel syndrome)    Cystocele    Depression    Diabetes mellitus    Dysfunction of eustachian tube    Fatty liver    Fibromyalgia    Fissure, anal    Gallbladder problem    Genital herpes    GERD (gastroesophageal reflux disease)    Headache    Hemorrhoid    Hx of migraine headaches    Hyperlipidemia    IBS (irritable bowel syndrome)    Insomnia    Iron deficiency anemia, unspecified    Joint pain    Nausea    Obesity    OSA on CPAP    Osteoarthritis    Rectocele    Sleep apnea    TMJ (dislocation of temporomandibular joint)     Surgeries: Procedure(s): LEFT TOTAL KNEE ARTHROPLASTY on 05/31/2021   Consultants:   Discharged Condition: Improved  Hospital Course: AURIEL KIST is an 60 y.o. female who was admitted 05/31/2021 for operative treatment ofUnilateral primary osteoarthritis, left knee. Patient has severe unremitting pain that affects sleep, daily activities, and work/hobbies. After pre-op clearance the patient was taken to the operating room on 05/31/2021 and underwent  Procedure(s): LEFT TOTAL KNEE ARTHROPLASTY.    Patient was given perioperative antibiotics:  Anti-infectives (From admission, onward)    Start     Dose/Rate Route Frequency Ordered Stop   05/31/21 2200  acyclovir (ZOVIRAX) tablet 400 mg  Status:  Discontinued        400 mg Oral 2 times daily 05/31/21 1459 06/02/21 1801   05/31/21 2000  ceFAZolin (ANCEF) IVPB 1 g/50 mL premix        1  g 100 mL/hr over 30 Minutes Intravenous Every 6 hours 05/31/21 1144 06/01/21 0310   05/31/21 0800  ceFAZolin (ANCEF) IVPB 2g/100 mL premix        2 g 200 mL/hr over 30 Minutes Intravenous On call to O.R. 05/31/21 0748 05/31/21 1010        Patient was given sequential compression devices, early ambulation, and chemoprophylaxis to prevent DVT.  Patient benefited maximally from hospital stay and there were no complications.    Recent vital signs: No data found.   Recent laboratory studies: No results for input(s): WBC, HGB, HCT, PLT, NA, K, CL, CO2, BUN, CREATININE, GLUCOSE, INR, CALCIUM in the last 72 hours.  Invalid input(s): PT, 2   Discharge Medications:   Allergies as of 06/02/2021       Reactions   Fish Oil Rash        Medication List     TAKE these medications    acyclovir 400 MG tablet Commonly known as: ZOVIRAX Take 1 tablet (400 mg total) by mouth 2 (two) times daily.   albuterol 108 (90 Base) MCG/ACT inhaler Commonly known as: VENTOLIN HFA Inhale 1 puff into the lungs every 6 (six) hours as needed for wheezing or shortness of breath.   aspirin 81 MG  chewable tablet Chew 1 tablet (81 mg total) by mouth 2 (two) times daily.   atorvastatin 20 MG tablet Commonly known as: LIPITOR Take 1 tablet (20 mg total) by mouth daily.   buPROPion 100 MG tablet Commonly known as: WELLBUTRIN Take 1 tablet (100 mg total) by mouth daily.   calcium carbonate 1250 (500 Ca) MG chewable tablet Commonly known as: OS-CAL Chew 1 tablet by mouth daily.   cetirizine 10 MG tablet Commonly known as: ZYRTEC Take 1 tablet (10 mg total) by mouth once daily   escitalopram 10 MG tablet Commonly known as: LEXAPRO TAKE 1 TABLET BY MOUTH ONCE DAILY   fluticasone 50 MCG/ACT nasal spray Commonly known as: FLONASE Place 2 sprays into both nostrils daily.   gabapentin 300 MG capsule Commonly known as: NEURONTIN Take 1 capsule (300 mg total) by mouth 2 (two) times daily   glucose  blood test strip accucheck guide strips and fast clix lancets Use as instructed dm 2   Jardiance 25 MG Tabs tablet Generic drug: empagliflozin Take 1 tablet (25 mg total) by mouth daily with breakfast.   MELATONIN PO Take 15 mg by mouth at bedtime.   metFORMIN 500 MG tablet Commonly known as: GLUCOPHAGE Take 1 tablet (500 mg total) by mouth daily with supper.   methocarbamol 500 MG tablet Commonly known as: ROBAXIN Take 1 tablet (500 mg total) by mouth every 6 (six) hours as needed for muscle spasms.   multivitamin with minerals tablet Take 1 tablet by mouth daily.   nitroGLYCERIN 0.4 MG SL tablet Commonly known as: NITROSTAT Place 1 tablet (0.4 mg total) under the tongue every 5 (five) minutes as needed for chest pain.   oxyCODONE 5 MG immediate release tablet Commonly known as: Oxy IR/ROXICODONE Take 1-2 tablets (5-10 mg total) by mouth every 6 (six) hours as needed for moderate pain (pain score 4-6).   pantoprazole 40 MG tablet Commonly known as: PROTONIX Take 1 tablet (40 mg total) by mouth 2 (two) times daily   TRUEplus Lancets 30G Misc USE TO CHECK BLOOD GLUCOSE DAILY AS DIRECTED       ASK your doctor about these medications    Anucort-HC 25 MG suppository Generic drug: hydrocortisone PLACE 1 SUPPOSITORY RECTALLY 2 TIMES DAILY AS NEEDED FOR HEMORRHOIDS.   azelastine 0.05 % ophthalmic solution Commonly known as: OPTIVAR Place 1 drop into both eyes 2 (two) times daily as needed.   azelastine 0.1 % nasal spray Commonly known as: ASTELIN PLACE 2 SPRAYS INTO BOTH NOSTRILS 2 TIMES DAILY AS DIRECTED   diclofenac Sodium 1 % Gel Commonly known as: VOLTAREN Apply topically 4 (four) times daily.   diltiazem 2 % Gel Apply 1 application topically 3 (three) times daily.   hydrocortisone 2.5 % ointment Apply topically 2 (two) times daily.        Diagnostic Studies: DG Knee Left Port  Result Date: 05/31/2021 CLINICAL DATA:  Status post knee replacement.  EXAM: PORTABLE LEFT KNEE - 1-2 VIEW COMPARISON:  10/26/2019 FINDINGS: Two views study shows tricompartmental knee replacement. No evidence for immediate hardware complication. Gas in the soft tissues is compatible with the immediate postoperative state with skin staples noted at the midline. IMPRESSION: Status post left total knee replacement.  No complicating features. Electronically Signed   By: Misty Stanley M.D.   On: 05/31/2021 12:39    Disposition: Discharge disposition: 01-Home or Self Care       Discharge Instructions     Call MD / Call 911  Complete by: As directed    If you experience chest pain or shortness of breath, CALL 911 and be transported to the hospital emergency room.  If you develope a fever above 101 F, pus (white drainage) or increased drainage or redness at the wound, or calf pain, call your surgeon's office.   Constipation Prevention   Complete by: As directed    Drink plenty of fluids.  Prune juice may be helpful.  You may use a stool softener, such as Colace (over the counter) 100 mg twice a day.  Use MiraLax (over the counter) for constipation as needed.   Diet - low sodium heart healthy   Complete by: As directed    Increase activity slowly as tolerated   Complete by: As directed    Post-operative opioid taper instructions:   Complete by: As directed    POST-OPERATIVE OPIOID TAPER INSTRUCTIONS: It is important to wean off of your opioid medication as soon as possible. If you do not need pain medication after your surgery it is ok to stop day one. Opioids include: Codeine, Hydrocodone(Norco, Vicodin), Oxycodone(Percocet, oxycontin) and hydromorphone amongst others.  Long term and even short term use of opiods can cause: Increased pain response Dependence Constipation Depression Respiratory depression And more.  Withdrawal symptoms can include Flu like symptoms Nausea, vomiting And more Techniques to manage these symptoms Hydrate well Eat regular  healthy meals Stay active Use relaxation techniques(deep breathing, meditating, yoga) Do Not substitute Alcohol to help with tapering If you have been on opioids for less than two weeks and do not have pain than it is ok to stop all together.  Plan to wean off of opioids This plan should start within one week post op of your joint replacement. Maintain the same interval or time between taking each dose and first decrease the dose.  Cut the total daily intake of opioids by one tablet each day Next start to increase the time between doses. The last dose that should be eliminated is the evening dose.           Follow-up Information     [facility] [place] Follow up.   Specialty: [facility] [place] [facility]: [facility] will provide you with home health PT , start of care within 48 hrs post discharge Contact information: [address] [place] [address] [phone]         Mcarthur Rossetti, MD Follow up in 2 week(s).   Specialty: Orthopedic Surgery Contact information: [address] [place] [address] [phone]                  Signed: Mcarthur Rossetti 06/06/2021, 8:14 AM

## 2021-06-07 ENCOUNTER — Telehealth: Payer: Self-pay | Admitting: Orthopaedic Surgery

## 2021-06-07 ENCOUNTER — Other Ambulatory Visit: Payer: Self-pay | Admitting: Orthopaedic Surgery

## 2021-06-07 ENCOUNTER — Other Ambulatory Visit (HOSPITAL_COMMUNITY): Payer: Self-pay

## 2021-06-07 MED ORDER — OXYCODONE HCL 5 MG PO TABS
5.0000 mg | ORAL_TABLET | Freq: Four times a day (QID) | ORAL | 0 refills | Status: DC | PRN
  Filled 2021-06-07: qty 30, 4d supply, fill #0

## 2021-06-07 NOTE — Telephone Encounter (Signed)
Pt would like to know if she can have a refill on oxycodone?   CB [phone]

## 2021-06-08 ENCOUNTER — Encounter: Payer: Self-pay | Admitting: Orthopaedic Surgery

## 2021-06-08 DIAGNOSIS — F32A Depression, unspecified: Secondary | ICD-10-CM | POA: Diagnosis not present

## 2021-06-08 DIAGNOSIS — G4700 Insomnia, unspecified: Secondary | ICD-10-CM | POA: Diagnosis not present

## 2021-06-08 DIAGNOSIS — E119 Type 2 diabetes mellitus without complications: Secondary | ICD-10-CM | POA: Diagnosis not present

## 2021-06-08 DIAGNOSIS — E7800 Pure hypercholesterolemia, unspecified: Secondary | ICD-10-CM | POA: Diagnosis not present

## 2021-06-08 DIAGNOSIS — K580 Irritable bowel syndrome with diarrhea: Secondary | ICD-10-CM | POA: Diagnosis not present

## 2021-06-08 DIAGNOSIS — D72820 Lymphocytosis (symptomatic): Secondary | ICD-10-CM | POA: Diagnosis not present

## 2021-06-08 DIAGNOSIS — Z471 Aftercare following joint replacement surgery: Secondary | ICD-10-CM | POA: Diagnosis not present

## 2021-06-08 DIAGNOSIS — I10 Essential (primary) hypertension: Secondary | ICD-10-CM | POA: Diagnosis not present

## 2021-06-08 NOTE — Telephone Encounter (Signed)
Ok with a mepilex or something over the counter?

## 2021-06-11 DIAGNOSIS — D72820 Lymphocytosis (symptomatic): Secondary | ICD-10-CM | POA: Diagnosis not present

## 2021-06-11 DIAGNOSIS — K580 Irritable bowel syndrome with diarrhea: Secondary | ICD-10-CM | POA: Diagnosis not present

## 2021-06-11 DIAGNOSIS — I10 Essential (primary) hypertension: Secondary | ICD-10-CM | POA: Diagnosis not present

## 2021-06-11 DIAGNOSIS — E119 Type 2 diabetes mellitus without complications: Secondary | ICD-10-CM | POA: Diagnosis not present

## 2021-06-11 DIAGNOSIS — Z471 Aftercare following joint replacement surgery: Secondary | ICD-10-CM | POA: Diagnosis not present

## 2021-06-11 DIAGNOSIS — F32A Depression, unspecified: Secondary | ICD-10-CM | POA: Diagnosis not present

## 2021-06-11 DIAGNOSIS — G4700 Insomnia, unspecified: Secondary | ICD-10-CM | POA: Diagnosis not present

## 2021-06-11 DIAGNOSIS — E7800 Pure hypercholesterolemia, unspecified: Secondary | ICD-10-CM | POA: Diagnosis not present

## 2021-06-13 DIAGNOSIS — E119 Type 2 diabetes mellitus without complications: Secondary | ICD-10-CM | POA: Diagnosis not present

## 2021-06-13 DIAGNOSIS — G4700 Insomnia, unspecified: Secondary | ICD-10-CM | POA: Diagnosis not present

## 2021-06-13 DIAGNOSIS — Z471 Aftercare following joint replacement surgery: Secondary | ICD-10-CM | POA: Diagnosis not present

## 2021-06-13 DIAGNOSIS — E7800 Pure hypercholesterolemia, unspecified: Secondary | ICD-10-CM | POA: Diagnosis not present

## 2021-06-13 DIAGNOSIS — D72820 Lymphocytosis (symptomatic): Secondary | ICD-10-CM | POA: Diagnosis not present

## 2021-06-13 DIAGNOSIS — F32A Depression, unspecified: Secondary | ICD-10-CM | POA: Diagnosis not present

## 2021-06-13 DIAGNOSIS — I10 Essential (primary) hypertension: Secondary | ICD-10-CM | POA: Diagnosis not present

## 2021-06-13 DIAGNOSIS — K580 Irritable bowel syndrome with diarrhea: Secondary | ICD-10-CM | POA: Diagnosis not present

## 2021-06-14 ENCOUNTER — Encounter: Payer: Self-pay | Admitting: Physician Assistant

## 2021-06-14 ENCOUNTER — Ambulatory Visit (INDEPENDENT_AMBULATORY_CARE_PROVIDER_SITE_OTHER): Payer: 59 | Admitting: Physician Assistant

## 2021-06-14 ENCOUNTER — Other Ambulatory Visit: Payer: Self-pay

## 2021-06-14 DIAGNOSIS — G8929 Other chronic pain: Secondary | ICD-10-CM

## 2021-06-14 DIAGNOSIS — M25562 Pain in left knee: Secondary | ICD-10-CM

## 2021-06-14 NOTE — Progress Notes (Signed)
HPI: Ms. Weesner returns today 2-week status post left total knee arthroplasty.  She states she is having significant pain.  She is taking oxycodone.  She also notes that icing helps the knee.  She is on 81 mg aspirin for DVT prophylaxis.  She had no shortness of breath fevers chills.  Physical exam: Left knee she has full extension and flexes to at least 90 degrees.  Calf supple nontender.  Dorsiflexion plantarflexion left ankle intact.  Calf supple nontender.  Impression: Status post left total knee arthroplasty 05/31/2021  Plan: She will transition to outpatient physical therapy here at our office to work on range of motion strengthening and again a home exercise program for her left knee.  She will work on scar tissue mobilization.  Continue 81 mg aspirin once daily for another week and then discontinue as she was on no aspirin prior surgery.  She is currently out of work we will reevaluate her work status at her next office appointment in 1 month.  Questions encouraged and answered.

## 2021-06-15 DIAGNOSIS — Z471 Aftercare following joint replacement surgery: Secondary | ICD-10-CM | POA: Diagnosis not present

## 2021-06-15 DIAGNOSIS — E119 Type 2 diabetes mellitus without complications: Secondary | ICD-10-CM | POA: Diagnosis not present

## 2021-06-15 DIAGNOSIS — G4700 Insomnia, unspecified: Secondary | ICD-10-CM | POA: Diagnosis not present

## 2021-06-15 DIAGNOSIS — F32A Depression, unspecified: Secondary | ICD-10-CM | POA: Diagnosis not present

## 2021-06-15 DIAGNOSIS — I10 Essential (primary) hypertension: Secondary | ICD-10-CM | POA: Diagnosis not present

## 2021-06-15 DIAGNOSIS — K580 Irritable bowel syndrome with diarrhea: Secondary | ICD-10-CM | POA: Diagnosis not present

## 2021-06-15 DIAGNOSIS — E7800 Pure hypercholesterolemia, unspecified: Secondary | ICD-10-CM | POA: Diagnosis not present

## 2021-06-15 DIAGNOSIS — D72820 Lymphocytosis (symptomatic): Secondary | ICD-10-CM | POA: Diagnosis not present

## 2021-06-18 ENCOUNTER — Ambulatory Visit (INDEPENDENT_AMBULATORY_CARE_PROVIDER_SITE_OTHER): Payer: 59 | Admitting: Physical Therapy

## 2021-06-18 ENCOUNTER — Encounter: Payer: Self-pay | Admitting: Physical Therapy

## 2021-06-18 ENCOUNTER — Other Ambulatory Visit: Payer: Self-pay

## 2021-06-18 DIAGNOSIS — M6281 Muscle weakness (generalized): Secondary | ICD-10-CM | POA: Diagnosis not present

## 2021-06-18 DIAGNOSIS — R600 Localized edema: Secondary | ICD-10-CM

## 2021-06-18 DIAGNOSIS — M25562 Pain in left knee: Secondary | ICD-10-CM | POA: Diagnosis not present

## 2021-06-18 DIAGNOSIS — M25662 Stiffness of left knee, not elsewhere classified: Secondary | ICD-10-CM

## 2021-06-18 DIAGNOSIS — R262 Difficulty in walking, not elsewhere classified: Secondary | ICD-10-CM

## 2021-06-18 NOTE — Therapy (Signed)
[facility] Physical Therapy [address] [place], [place], [address] Phone: [phone]   Fax:  [phone]  Physical Therapy Evaluation  Patient Details  Name: Cristina [name] MRN: [id number] Date of Birth: 03-19-1961 Referring Provider (PT): Erskine Emery, PA-C   Encounter Date: 06/18/2021   PT End of Session - 06/18/21 0814     Visit Number 1    Number of Visits 24    Date for PT Re-Evaluation 09/14/21    Authorization Type UMR    PT Start Time 0805    PT Stop Time 0840    PT Time Calculation (min) 35 min    Activity Tolerance Patient tolerated treatment well    Behavior During Therapy [facility] for tasks assessed/performed             Past Medical History:  Diagnosis Date   Allergic rhinitis, cause unspecified    Anemia    Arthritis    Asthma    Back pain    Chest pain    Constipation    CTS (carpal tunnel syndrome)    Cystocele    Depression    Diabetes mellitus    Dysfunction of eustachian tube    Fatty liver    Fibromyalgia    Fissure, anal    Gallbladder problem    Genital herpes    GERD (gastroesophageal reflux disease)    Headache    Hemorrhoid    Hx of migraine headaches    Hyperlipidemia    IBS (irritable bowel syndrome)    Insomnia    Iron deficiency anemia, unspecified    Joint pain    Nausea    Obesity    OSA on CPAP    Osteoarthritis    Rectocele    Sleep apnea    TMJ (dislocation of temporomandibular joint)     Past Surgical History:  Procedure Laterality Date   ABDOMINAL HYSTERECTOMY  11/11/2010   [place] Plan.  Ovaries intact.  Uterine fibroids with DUB.   CARPAL TUNNEL RELEASE  11/12/1987   Left   CHOLECYSTECTOMY     CHOLECYSTECTOMY     COLONOSCOPY  06/29/2012   normal.  Repeat in 5 years.   DILATION AND CURETTAGE OF UTERUS     ECTOPIC PREGNANCY SURGERY  11/12/1991   ESOPHAGOGASTRODUODENOSCOPY  12/13/2011   dysphagia.  Henrene Pastor.  Normal.   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  CPAP titration at 12 cm water pressure.   TOTAL KNEE ARTHROPLASTY Left 05/31/2021   Procedure: LEFT TOTAL KNEE ARTHROPLASTY;  Surgeon: Mcarthur Rossetti, MD;  Location: [facility];  Service: Orthopedics;  Laterality: Left;   TUBAL LIGATION  11/11/1986    There were no vitals filed for this visit.    Subjective Assessment - 06/18/21 0808     Subjective Pt arriving s/p left TKA on 05/31/2021. Pt amb with a rolling walker. Pt reporting a few visits of HHPT and states she has been doing her exercises and icing for pain and swelling. Pt states she has been only trying to take over the counter pain meds, but has her prescription as needed.    Limitations Walking;House hold activities    Diagnostic tests X-ray    Patient Stated Goals Walk withou pain, get my leg moving    Currently in Pain? Yes  Pain Score 5     Pain Location Knee    Pain Orientation Left    Pain Descriptors / Indicators Aching;Tightness;Sore;Discomfort    Pain Type Surgical pain    Pain Onset 1 to 4 weeks ago    Pain Frequency Constant    Aggravating Factors  bending, walking, stiffness after prolonged sitting or lack of movement    Effect of Pain on Daily Activities difficulty with walking, difficulty with standing too long                [facility] PT Assessment - 06/18/21 0001       Assessment   Medical Diagnosis left TKA chronic left knee pain M25.562    Referring Provider (PT) Erskine Emery, PA-C    Onset Date/Surgical Date 05/31/21    Hand Dominance Right    Prior Therapy HHPT      Precautions   Precautions None      Restrictions   Weight Bearing Restrictions No      Balance Screen   Has the patient fallen in the past 6 months No    Is the patient reluctant to leave their home because of a fear of falling?  No      Home Environment    Living Environment Private residence    Living Arrangements Alone    Type of [place] to enter    Entrance Stairs-Number of Steps 4    Entrance Stairs-Rails Cannot reach both      Prior Function   Level of Independence Independent    Vocation Full time employment    Education officer, museum in cardiology department    Leisure crafts      Cognition   Overall Cognitive Status Within Functional Limits for tasks assessed      Observation/Other Assessments   Focus on Therapeutic Outcomes (FOTO)  44% (predicted 58%)      Observation/Other Assessments-Edema    Edema --   2 centimeter increase in left knee when compared to right     Posture/Postural Control   Posture/Postural Control Postural limitations    Postural Limitations Rounded Shoulders;Forward head      ROM / Strength   AROM / PROM / Strength AROM;PROM;Strength      AROM   Overall AROM  Deficits    AROM Assessment Site Knee    Right/Left Knee Right;Left    Right Knee Extension -4    Right Knee Flexion 136    Left Knee Extension -20    Left Knee Flexion 72      PROM   PROM Assessment Site Knee    Right/Left Knee Right;Left    Right Knee Extension -2    Right Knee Flexion 138    Left Knee Extension -15    Left Knee Flexion 80      Strength   Strength Assessment Site Knee    Right/Left Knee Right;Left    Right Knee Flexion 5/5    Right Knee Extension 5/5    Left Knee Flexion 3/5    Left Knee Extension 3/5      Palpation   Palpation comment TTP around medial and lateral joint line      Ambulation/Gait   Gait Comments amb with rolling walker with antalgic gait with decreased step length and decreased weight shifting to left LE  Objective measurements completed on examination: See above findings.       [place] Adult PT Treatment/Exercise - 06/18/21 0001       Transfers   Five time sit to stand comments  16 seconds with UE  support      Exercises   Exercises Knee/Hip      Knee/Hip Exercises: Seated   Long Arc Quad Strengthening;AROM;Left;5 reps    Sit to Sand 5 reps;with UE support      Knee/Hip Exercises: Supine   Quad Sets Strengthening;AROM;10 reps    Quad Sets Limitations holding 5 seconds    Heel Slides Left;AROM;5 reps    Straight Leg Raises Strengthening;Left;5 reps      Knee/Hip Exercises: Prone   Other Prone Exercises discussed prone knee hangs for HEP                    PT Education - 06/18/21 0813     Education Details PT POC, HEP updated and instructed to contiue her standing exercises given to her by HHPT.    Person(s) Educated Patient    Methods Explanation;Demonstration;Handout;Verbal cues;Tactile cues    Comprehension Returned demonstration;Verbalized understanding              PT Short Term Goals - 06/18/21 1002       PT SHORT TERM GOAL #1   Title She will be independent with intial HEP.    Status New    Target Date 07/06/21      PT SHORT TERM GOAL #2   Title Pt will report decr pain and be  able to stand 10 min or more before increased pain    Baseline pain with prolonged standing and washing dishes and cooking    Status New    Target Date 07/13/21      PT SHORT TERM GOAL #3   Title -    Baseline -               PT Long Term Goals - 06/18/21 1005       PT LONG TERM GOAL #1   Title Pt will be independent in her advanced HEP.    Time 12    Period Weeks    Status New    Target Date 09/14/21      PT LONG TERM GOAL #2   Title She will be able to walk for > 20 min with pain in knee to </= 3/10 with no device.    Time 12    Period Weeks    Status New    Target Date 09/14/21      PT LONG TERM GOAL #3   Title Pt will improve her left knee flexion and extension from -5 to 120 degrees    Time 12    Period Weeks    Status New    Target Date 09/14/21      PT LONG TERM GOAL #4   Title Pt will improve her left knee strength to grossly +4/5.     Time 12    Period Weeks    Status New    Target Date 09/14/21      PT LONG TERM GOAL #5   Title FOTO improved to >/= 58 % function.    Time 12    Period Weeks    Status New    Target Date 09/14/21                    Plan -  06/18/21 0844     Clinical Impression Statement Pt arriving today reporting 5/10 pain in her left knee s/p left TKA on 05/31/2021. Pt amb with a rolling walker with antalgic gait pattern. Pt with weakness noted in left LE grossly 3/5 and decreased range of motion in both flexion and extension which is limiting pt's functional mobility and ADL's. Skilled PT needed to address pt's impairments with the below interventions.    Personal Factors and Comorbidities Comorbidity 3+    Comorbidities anemia arthritis, asthma, depression, DM, fibromyalgia, back pain, GERD, HA, hemorrhoid, IBS, hyperlipidemia, Iron deficiency, obesity, sleep apnea,    Examination-Activity Limitations Stairs;Squat;Sleep;Transfers;Stand;Locomotion Level    Examination-Participation Restrictions Community Activity;Other;Occupation;Cleaning    Stability/Clinical Decision Making Stable/Uncomplicated    Clinical Decision Making Low    Rehab Potential Good    PT Frequency 2x / week    PT Duration 12 weeks    PT Treatment/Interventions ADLs/Self Care Home Management;Cryotherapy;Electrical Stimulation;Moist Heat;Gait training;Stair training;Functional mobility training;Therapeutic activities;Therapeutic exercise;Balance training;Neuromuscular re-education;Patient/family education;Passive range of motion;Manual techniques;Dry needling;Taping;Vasopneumatic Device    PT Next Visit Plan nustep, ROM, strengthening, manual/ PROM    PT Home Exercise Plan Access Code: [email or web address]  URL: [email or web address]  Date: 06/18/2021  Prepared by: Kearney Hard    Exercises  Supine Heel Slide - 3-4 x daily - 7 x weekly - 2 sets - 10 reps  Seated Knee Flexion Extension AROM - 3-4 x daily - 7 x weekly -  2 sets - 10 reps  Supine Active Straight Leg Raise - 3-4 x daily - 7 x weekly - 2 sets - 10 reps  Sit to Stand with Counter Support - 3-4 x daily - 7 x weekly - 2 sets - 10 reps  Seated Long Arc Quad with Hip Adduction - 3-4 x daily - 7 x weekly - 3 sets - 10 reps  Prone Knee Extension Hang - 3 x daily - 7 x weekly - build up to 10 minute holds hold  Supine Quad Set - 3-4 x daily - 7 x weekly - 2 sets - 10 reps - 5 seconds hold    Consulted and Agree with Plan of Care Patient             Patient will benefit from skilled therapeutic intervention in order to improve the following deficits and impairments:  Pain, Decreased mobility, Decreased strength, Increased edema, Decreased balance, Decreased activity tolerance, Difficulty walking, Decreased range of motion, Impaired flexibility, Abnormal gait  Visit Diagnosis: Acute pain of left knee  Stiffness of left knee, not elsewhere classified  Muscle weakness (generalized)  Localized edema  Difficulty in walking, not elsewhere classified     Problem List Patient Active Problem List   Diagnosis Date Noted   Status post left knee replacement 06/01/2021   Status post total left knee replacement 05/31/2021   Unilateral primary osteoarthritis, left knee 12/21/2020   Morbid obesity with body mass index (BMI) of 45.0 to 49.9 in adult ([place]) 07/06/2018   Type 2 diabetes mellitus without complication, without long-term current use of insulin ([facility]) 05/22/2017   Class 3 obesity with serious comorbidity and body mass index (BMI) of 40.0 to 44.9 in adult 05/21/2017   Family history of colon cancer 04/01/2017   Irritable bowel syndrome with diarrhea 04/01/2017   NSAID long-term use 04/01/2017   Lymphocytosis 02/24/2017   Essential hypertension 02/12/2017   Mild intermittent asthma with acute exacerbation 12/12/2016   Left shoulder pain 06/02/2015   Insomnia 05/12/2014   Fibromyalgia 05/12/2014  Allergic rhinitis 03/28/2014   Osteoarthritis  12/20/2013   Cervical strain 06/21/2013   Low back pain 06/21/2013   Metatarsalgia of both feet 03/11/2013   Sciatica 01/25/2013   Restless leg syndrome 11/24/2012   Pure hypercholesterolemia 10/20/2012   OSA on CPAP 10/20/2012   Depression 10/20/2012   Iron deficiency anemia, unspecified 10/01/2012   Snoring 10/01/2012   Diabetes mellitus, type 2 ([place]) 12/31/2010   ANAL FISSURE 12/31/2010   Morbid obesity ([place]) 01/08/2007   CONSTIPATION 01/08/2007    Oretha Caprice, PT, MPT 06/18/2021, 10:21 AM  [facility] Physical Therapy [address] [place], [place], [address] Phone: [phone]   Fax:  [phone]  Name: Cristina [name] MRN: [id number] Date of Birth: [birth year]

## 2021-06-18 NOTE — Patient Instructions (Signed)
Access Code: [email or web address] URL: [email or web address] Date: 06/18/2021 Prepared by: Kearney Hard  Exercises Supine Heel Slide - 3-4 x daily - 7 x weekly - 2 sets - 10 reps Seated Knee Flexion Extension AROM - 3-4 x daily - 7 x weekly - 2 sets - 10 reps Supine Active Straight Leg Raise - 3-4 x daily - 7 x weekly - 2 sets - 10 reps Sit to Stand with Counter Support - 3-4 x daily - 7 x weekly - 2 sets - 10 reps Seated Long Arc Quad with Hip Adduction - 3-4 x daily - 7 x weekly - 3 sets - 10 reps Prone Knee Extension Hang - 3 x daily - 7 x weekly - build up to 10 minute holds hold Supine Quad Set - 3-4 x daily - 7 x weekly - 2 sets - 10 reps - 5 seconds hold

## 2021-06-19 ENCOUNTER — Encounter: Payer: Self-pay | Admitting: Rehabilitative and Restorative Service Providers"

## 2021-06-19 ENCOUNTER — Ambulatory Visit (INDEPENDENT_AMBULATORY_CARE_PROVIDER_SITE_OTHER): Payer: 59 | Admitting: Rehabilitative and Restorative Service Providers"

## 2021-06-19 DIAGNOSIS — M6281 Muscle weakness (generalized): Secondary | ICD-10-CM | POA: Diagnosis not present

## 2021-06-19 DIAGNOSIS — R262 Difficulty in walking, not elsewhere classified: Secondary | ICD-10-CM | POA: Diagnosis not present

## 2021-06-19 DIAGNOSIS — M25662 Stiffness of left knee, not elsewhere classified: Secondary | ICD-10-CM

## 2021-06-19 DIAGNOSIS — M25562 Pain in left knee: Secondary | ICD-10-CM | POA: Diagnosis not present

## 2021-06-19 DIAGNOSIS — R600 Localized edema: Secondary | ICD-10-CM | POA: Diagnosis not present

## 2021-06-19 NOTE — Therapy (Signed)
[place], [place], [address] Phone: [phone]   Fax:  [phone]  Physical Therapy Treatment  Patient Details  Name: Cristina [name] MRN: [id number] Date of Birth: 20-Sep-1961 Referring Provider (PT): Erskine Emery, PA-C   Encounter Date: 06/19/2021   PT End of Session - 06/19/21 1300     Visit Number 2    Number of Visits 24    Date for PT Re-Evaluation 09/14/21    Authorization Type UMR    PT Start Time 1300    PT Stop Time 1339    PT Time Calculation (min) 39 min    Activity Tolerance Patient limited by pain    Behavior During Therapy [facility] for tasks assessed/performed             Past Medical History:  Diagnosis Date   Allergic rhinitis, cause unspecified    Anemia    Arthritis    Asthma    Back pain    Chest pain    Constipation    CTS (carpal tunnel syndrome)    Cystocele    Depression    Diabetes mellitus    Dysfunction of eustachian tube    Fatty liver    Fibromyalgia    Fissure, anal    Gallbladder problem    Genital herpes    GERD (gastroesophageal reflux disease)    Headache    Hemorrhoid    Hx of migraine headaches    Hyperlipidemia    IBS (irritable bowel syndrome)    Insomnia    Iron deficiency anemia, unspecified    Joint pain    Nausea    Obesity    OSA on CPAP    Osteoarthritis    Rectocele    Sleep apnea    TMJ (dislocation of temporomandibular joint)     Past Surgical History:  Procedure Laterality Date   ABDOMINAL HYSTERECTOMY  11/11/2010   [place] Plan.  Ovaries intact.  Uterine fibroids with DUB.   CARPAL TUNNEL RELEASE  11/12/1987   Left   CHOLECYSTECTOMY     CHOLECYSTECTOMY     COLONOSCOPY  06/29/2012   normal.  Repeat in 5 years.   DILATION AND CURETTAGE OF UTERUS     ECTOPIC PREGNANCY SURGERY  11/12/1991   ESOPHAGOGASTRODUODENOSCOPY  12/13/2011   dysphagia.  Henrene Pastor.  Normal.   GASTRIC ROUX-EN-Y N/A 07/06/2018   Procedure: LAPAROSCOPIC ROUX-EN-Y GASTRIC  BYPASS WITH UPPER ENDOSCOPY;  Surgeon: Excell Seltzer, MD;  Location: [facility];  Service: General;  Laterality: N/A;   Sleep study  09/11/2012   severe OSA.  CPAP titration at 12 cm water pressure.   TOTAL KNEE ARTHROPLASTY Left 05/31/2021   Procedure: LEFT TOTAL KNEE ARTHROPLASTY;  Surgeon: Mcarthur Rossetti, MD;  Location: [place];  Service: Orthopedics;  Laterality: Left;   TUBAL LIGATION  11/11/1986    There were no vitals filed for this visit.   Subjective Assessment - 06/19/21 1303     Subjective Pt. reported feeling that she had no pain at rest.  Pt. indicated feeling similar as she did on last visit.  Pt. stated no questions about HEP at this time.    Limitations Walking;House hold activities    Diagnostic tests X-ray    Patient Stated Goals Walk withou pain, get my leg moving    Currently in Pain? No/denies    Pain Score 0-No pain   no pain at rest   Pain Onset 1 to 4 weeks ago  [facility] Adult PT Treatment/Exercise - 06/19/21 0001       Knee/Hip Exercises: Stretches   Gastroc Stretch 30 seconds;5 reps;Left   incline board runner stretch     Knee/Hip Exercises: Aerobic   Nustep Lvl 5 6 mins      Knee/Hip Exercises: Seated   Long Arc Quad Left;3 sets;10 reps   pause in end ranges flexion and extension c contralateral leg movement opposite     Knee/Hip Exercises: Supine   Quad Sets Strengthening;Left;15 reps   5 sec hold   Heel Slides Left   10 sec hold x 10 (AROM c leg first, then overpressure from strap0     Manual Therapy   Manual therapy comments seated Lt knee flexion c mobilization IR/distraction c contralateral leg movement opposite.  static knee flexion stretch overpressure in sitting 3 x approx. 30 seconds                      PT Short Term Goals - 06/19/21 1321       PT SHORT TERM GOAL #1   Title She will be independent with intial HEP.    Status On-going    Target Date 07/06/21      PT  SHORT TERM GOAL #2   Title Pt will report decr pain and be  able to stand 10 min or more before increased pain    Baseline pain with prolonged standing and washing dishes and cooking    Status On-going    Target Date 07/13/21      PT SHORT TERM GOAL #3   Title -    Baseline -               PT Long Term Goals - 06/18/21 1005       PT LONG TERM GOAL #1   Title Pt will be independent in her advanced HEP.    Time 12    Period Weeks    Status New    Target Date 09/14/21      PT LONG TERM GOAL #2   Title She will be able to walk for > 20 min with pain in knee to </= 3/10 with no device.    Time 12    Period Weeks    Status New    Target Date 09/14/21      PT LONG TERM GOAL #3   Title Pt will improve her left knee flexion and extension from -5 to 120 degrees    Time 12    Period Weeks    Status New    Target Date 09/14/21      PT LONG TERM GOAL #4   Title Pt will improve her left knee strength to grossly +4/5.    Time 12    Period Weeks    Status New    Target Date 09/14/21      PT LONG TERM GOAL #5   Title FOTO improved to >/= 58 % function.    Time 12    Period Weeks    Status New    Target Date 09/14/21                   Plan - 06/19/21 1319     Clinical Impression Statement Pt. demonstrated marked deficits in Lt knee active and passive mobility.  Discussed importance of low load long duration extension stretching (in heel prop or prone hang) as well as continued intermittent movement in daily routine.  Continued  skilled PT services warranted at this time.    Personal Factors and Comorbidities Comorbidity 3+    Comorbidities anemia arthritis, asthma, depression, DM, fibromyalgia, back pain, GERD, HA, hemorrhoid, IBS, hyperlipidemia, Iron deficiency, obesity, sleep apnea,    Examination-Activity Limitations Stairs;Squat;Sleep;Transfers;Stand;Locomotion Level    Examination-Participation Restrictions Community Activity;Other;Occupation;Cleaning     Stability/Clinical Decision Making Stable/Uncomplicated    Rehab Potential Good    PT Frequency 2x / week    PT Duration 12 weeks    PT Treatment/Interventions ADLs/Self Care Home Management;Cryotherapy;Electrical Stimulation;Moist Heat;Gait training;Stair training;Functional mobility training;Therapeutic activities;Therapeutic exercise;Balance training;Neuromuscular re-education;Patient/family education;Passive range of motion;Manual techniques;Dry needling;Taping;Vasopneumatic Device    PT Next Visit Plan High recommendation for Rt knee manual intervention for mobility gains, strengthening as tolerated to active quad.    PT Home Exercise Plan Access Code: [email or web address]  URL: [email or web address]  Date: 06/18/2021  Prepared by: Kearney Hard    Exercises  Supine Heel Slide - 3-4 x daily - 7 x weekly - 2 sets - 10 reps  Seated Knee Flexion Extension AROM - 3-4 x daily - 7 x weekly - 2 sets - 10 reps  Supine Active Straight Leg Raise - 3-4 x daily - 7 x weekly - 2 sets - 10 reps  Sit to Stand with Counter Support - 3-4 x daily - 7 x weekly - 2 sets - 10 reps  Seated Long Arc Quad with Hip Adduction - 3-4 x daily - 7 x weekly - 3 sets - 10 reps  Prone Knee Extension Hang - 3 x daily - 7 x weekly - build up to 10 minute holds hold  Supine Quad Set - 3-4 x daily - 7 x weekly - 2 sets - 10 reps - 5 seconds hold    Consulted and Agree with Plan of Care Patient             Patient will benefit from skilled therapeutic intervention in order to improve the following deficits and impairments:  Pain, Decreased mobility, Decreased strength, Increased edema, Decreased balance, Decreased activity tolerance, Difficulty walking, Decreased range of motion, Impaired flexibility, Abnormal gait  Visit Diagnosis: Acute pain of left knee  Stiffness of left knee, not elsewhere classified  Muscle weakness (generalized)  Localized edema  Difficulty in walking, not elsewhere classified     Problem  List Patient Active Problem List   Diagnosis Date Noted   Status post left knee replacement 06/01/2021   Status post total left knee replacement 05/31/2021   Unilateral primary osteoarthritis, left knee 12/21/2020   Morbid obesity with body mass index (BMI) of 45.0 to 49.9 in adult ([place]) 07/06/2018   Type 2 diabetes mellitus without complication, without long-term current use of insulin (HCC) 05/22/2017   Class 3 obesity with serious comorbidity and body mass index (BMI) of 40.0 to 44.9 in adult 05/21/2017   Family history of colon cancer 04/01/2017   Irritable bowel syndrome with diarrhea 04/01/2017   NSAID long-term use 04/01/2017   Lymphocytosis 02/24/2017   Essential hypertension 02/12/2017   Mild intermittent asthma with acute exacerbation 12/12/2016   Left shoulder pain 06/02/2015   Insomnia 05/12/2014   Fibromyalgia 05/12/2014   Allergic rhinitis 03/28/2014   Osteoarthritis 12/20/2013   Cervical strain 06/21/2013   Low back pain 06/21/2013   Metatarsalgia of both feet 03/11/2013   Sciatica 01/25/2013   Restless leg syndrome 11/24/2012   Pure hypercholesterolemia 10/20/2012   OSA on CPAP 10/20/2012   Depression 10/20/2012   Iron deficiency anemia, unspecified 10/01/2012  Snoring 10/01/2012   Diabetes mellitus, type 2 ([place]) 12/31/2010   ANAL FISSURE 12/31/2010   Morbid obesity ([place]) 01/08/2007   CONSTIPATION 01/08/2007   Scot Jun, PT, DPT, OCS, ATC 06/19/21  1:33 PM    [facility] [address] [place], [place], [address] Phone: [phone]   Fax:  [phone]  Name: KORTNE ALL MRN: [id number] Date of Birth: [birth year]

## 2021-06-21 ENCOUNTER — Other Ambulatory Visit: Payer: Self-pay | Admitting: Orthopaedic Surgery

## 2021-06-21 ENCOUNTER — Other Ambulatory Visit (HOSPITAL_COMMUNITY): Payer: Self-pay

## 2021-06-21 ENCOUNTER — Encounter: Payer: Self-pay | Admitting: Orthopaedic Surgery

## 2021-06-21 MED ORDER — OXYCODONE HCL 5 MG PO TABS
5.0000 mg | ORAL_TABLET | Freq: Four times a day (QID) | ORAL | 0 refills | Status: DC | PRN
  Filled 2021-06-21: qty 30, 4d supply, fill #0

## 2021-06-25 ENCOUNTER — Other Ambulatory Visit (HOSPITAL_COMMUNITY): Payer: Self-pay

## 2021-06-25 ENCOUNTER — Encounter: Payer: Self-pay | Admitting: Physical Therapy

## 2021-06-25 ENCOUNTER — Other Ambulatory Visit: Payer: Self-pay

## 2021-06-25 ENCOUNTER — Ambulatory Visit (INDEPENDENT_AMBULATORY_CARE_PROVIDER_SITE_OTHER): Payer: 59 | Admitting: Physical Therapy

## 2021-06-25 DIAGNOSIS — M6281 Muscle weakness (generalized): Secondary | ICD-10-CM

## 2021-06-25 DIAGNOSIS — M25562 Pain in left knee: Secondary | ICD-10-CM

## 2021-06-25 DIAGNOSIS — R600 Localized edema: Secondary | ICD-10-CM

## 2021-06-25 DIAGNOSIS — M25662 Stiffness of left knee, not elsewhere classified: Secondary | ICD-10-CM

## 2021-06-25 DIAGNOSIS — R262 Difficulty in walking, not elsewhere classified: Secondary | ICD-10-CM

## 2021-06-25 NOTE — Therapy (Signed)
[place], [place], [address] Phone: [phone]   Fax:  [phone]  Physical Therapy Treatment  Patient Details  Name: Cristina [name] MRN: [id number] Date of Birth: 1961-10-28 Referring Provider (PT): Erskine Emery, PA-C   Encounter Date: 06/25/2021   PT End of Session - 06/25/21 1023     Visit Number 3    Number of Visits 24    Date for PT Re-Evaluation 09/14/21    Authorization Type UMR    PT Start Time 0935    PT Stop Time 1021    PT Time Calculation (min) 46 min    Activity Tolerance Patient limited by pain    Behavior During Therapy [facility] for tasks assessed/performed             Past Medical History:  Diagnosis Date   Allergic rhinitis, cause unspecified    Anemia    Arthritis    Asthma    Back pain    Chest pain    Constipation    CTS (carpal tunnel syndrome)    Cystocele    Depression    Diabetes mellitus    Dysfunction of eustachian tube    Fatty liver    Fibromyalgia    Fissure, anal    Gallbladder problem    Genital herpes    GERD (gastroesophageal reflux disease)    Headache    Hemorrhoid    Hx of migraine headaches    Hyperlipidemia    IBS (irritable bowel syndrome)    Insomnia    Iron deficiency anemia, unspecified    Joint pain    Nausea    Obesity    OSA on CPAP    Osteoarthritis    Rectocele    Sleep apnea    TMJ (dislocation of temporomandibular joint)     Past Surgical History:  Procedure Laterality Date   ABDOMINAL HYSTERECTOMY  11/11/2010   [place] Plan.  Ovaries intact.  Uterine fibroids with DUB.   CARPAL TUNNEL RELEASE  11/12/1987   Left   CHOLECYSTECTOMY     CHOLECYSTECTOMY     COLONOSCOPY  06/29/2012   normal.  Repeat in 5 years.   DILATION AND CURETTAGE OF UTERUS     ECTOPIC PREGNANCY SURGERY  11/12/1991   ESOPHAGOGASTRODUODENOSCOPY  12/13/2011   dysphagia.  Henrene Pastor.  Normal.   GASTRIC ROUX-EN-Y N/A 07/06/2018   Procedure: LAPAROSCOPIC ROUX-EN-Y GASTRIC  BYPASS WITH UPPER ENDOSCOPY;  Surgeon: Excell Seltzer, MD;  Location: [facility];  Service: General;  Laterality: N/A;   Sleep study  09/11/2012   severe OSA.  CPAP titration at 12 cm water pressure.   TOTAL KNEE ARTHROPLASTY Left 05/31/2021   Procedure: LEFT TOTAL KNEE ARTHROPLASTY;  Surgeon: Mcarthur Rossetti, MD;  Location: [place];  Service: Orthopedics;  Laterality: Left;   TUBAL LIGATION  11/11/1986    There were no vitals filed for this visit.   Subjective Assessment - 06/25/21 0938     Subjective "When can I come off the walker?"    Limitations Walking;House hold activities    Diagnostic tests X-ray    Patient Stated Goals Walk withou pain, get my leg moving    Currently in Pain? No/denies                               [facility] Adult PT Treatment/Exercise - 06/25/21 0939       Ambulation/Gait   Gait Comments amb with SPC  in clinic mod I      Knee/Hip Exercises: Stretches   Knee: Self-Stretch Limitations RLE over LLE 10 x 10 sec hold      Knee/Hip Exercises: Aerobic   Nustep L6 x 10 min, pause at end range flexion      Knee/Hip Exercises: Seated   Long Arc Quad Left;3 sets;10 reps    Long Arc Quad Weight 3 lbs.    Long [employer] Limitations limited range - cues to decrease hip flexion    Other Seated Knee/Hip Exercises tailgate knee flexion x 2 min      Knee/Hip Exercises: Supine   Quad Sets Left;10 reps    Quad Sets Limitations difficulty isolating quad    Short Arc Quad Sets Left;10 reps    Short Arc [employer] Limitations for quad activation      Modalities   Modalities Vasopneumatic      Vasopneumatic   Number Minutes Vasopneumatic  10 minutes    Vasopnuematic Location  Knee    Vasopneumatic Pressure Medium    Vasopneumatic Temperature  34      Manual Therapy   Manual therapy comments seated Lt knee flexion c mobilization IR/distraction c contralateral leg movement opposite.                      PT Short Term Goals -  06/19/21 1321       PT SHORT TERM GOAL #1   Title She will be independent with intial HEP.    Status On-going    Target Date 07/06/21      PT SHORT TERM GOAL #2   Title Pt will report decr pain and be  able to stand 10 min or more before increased pain    Baseline pain with prolonged standing and washing dishes and cooking    Status On-going    Target Date 07/13/21      PT SHORT TERM GOAL #3   Title -    Baseline -               PT Long Term Goals - 06/18/21 1005       PT LONG TERM GOAL #1   Title Pt will be independent in her advanced HEP.    Time 12    Period Weeks    Status New    Target Date 09/14/21      PT LONG TERM GOAL #2   Title She will be able to walk for > 20 min with pain in knee to </= 3/10 with no device.    Time 12    Period Weeks    Status New    Target Date 09/14/21      PT LONG TERM GOAL #3   Title Pt will improve her left knee flexion and extension from -5 to 120 degrees    Time 12    Period Weeks    Status New    Target Date 09/14/21      PT LONG TERM GOAL #4   Title Pt will improve her left knee strength to grossly +4/5.    Time 12    Period Weeks    Status New    Target Date 09/14/21      PT LONG TERM GOAL #5   Title FOTO improved to >/= 58 % function.    Time 12    Period Weeks    Status New    Target Date 09/14/21  Plan - 06/25/21 1024     Clinical Impression Statement Pt continues to have ROM limitations at this time, and difficulty with activating quad.  She is safe for indoor amb with SPC and short community distances.  Will continue to benefit from PT to maximize function.    Personal Factors and Comorbidities Comorbidity 3+    Comorbidities anemia arthritis, asthma, depression, DM, fibromyalgia, back pain, GERD, HA, hemorrhoid, IBS, hyperlipidemia, Iron deficiency, obesity, sleep apnea,    Examination-Activity Limitations Stairs;Squat;Sleep;Transfers;Stand;Locomotion Level     Examination-Participation Restrictions Community Activity;Other;Occupation;Cleaning    Stability/Clinical Decision Making Stable/Uncomplicated    Rehab Potential Good    PT Frequency 2x / week    PT Duration 12 weeks    PT Treatment/Interventions ADLs/Self Care Home Management;Cryotherapy;Electrical Stimulation;Moist Heat;Gait training;Stair training;Functional mobility training;Therapeutic activities;Therapeutic exercise;Balance training;Neuromuscular re-education;Patient/family education;Passive range of motion;Manual techniques;Dry needling;Taping;Vasopneumatic Device    PT Next Visit Plan High recommendation for Rt knee manual intervention for mobility gains, strengthening as tolerated to active quad (does well with active movement and visualizing quad)    PT Home Exercise Plan Access Code: [email or web address]  URL: [email or web address]  Date: 06/18/2021  Prepared by: Kearney Hard    Exercises  Supine Heel Slide - 3-4 x daily - 7 x weekly - 2 sets - 10 reps  Seated Knee Flexion Extension AROM - 3-4 x daily - 7 x weekly - 2 sets - 10 reps  Supine Active Straight Leg Raise - 3-4 x daily - 7 x weekly - 2 sets - 10 reps  Sit to Stand with Counter Support - 3-4 x daily - 7 x weekly - 2 sets - 10 reps  Seated Long Arc Quad with Hip Adduction - 3-4 x daily - 7 x weekly - 3 sets - 10 reps  Prone Knee Extension Hang - 3 x daily - 7 x weekly - build up to 10 minute holds hold  Supine Quad Set - 3-4 x daily - 7 x weekly - 2 sets - 10 reps - 5 seconds hold    Consulted and Agree with Plan of Care Patient             Patient will benefit from skilled therapeutic intervention in order to improve the following deficits and impairments:  Pain, Decreased mobility, Decreased strength, Increased edema, Decreased balance, Decreased activity tolerance, Difficulty walking, Decreased range of motion, Impaired flexibility, Abnormal gait  Visit Diagnosis: Acute pain of left knee  Stiffness of left knee, not  elsewhere classified  Muscle weakness (generalized)  Localized edema  Difficulty in walking, not elsewhere classified     Problem List Patient Active Problem List   Diagnosis Date Noted   Status post left knee replacement 06/01/2021   Status post total left knee replacement 05/31/2021   Unilateral primary osteoarthritis, left knee 12/21/2020   Morbid obesity with body mass index (BMI) of 45.0 to 49.9 in adult ([place]) 07/06/2018   Type 2 diabetes mellitus without complication, without long-term current use of insulin (HCC) 05/22/2017   Class 3 obesity with serious comorbidity and body mass index (BMI) of 40.0 to 44.9 in adult 05/21/2017   Family history of colon cancer 04/01/2017   Irritable bowel syndrome with diarrhea 04/01/2017   NSAID long-term use 04/01/2017   Lymphocytosis 02/24/2017   Essential hypertension 02/12/2017   Mild intermittent asthma with acute exacerbation 12/12/2016   Left shoulder pain 06/02/2015   Insomnia 05/12/2014   Fibromyalgia 05/12/2014   Allergic rhinitis 03/28/2014   Osteoarthritis 12/20/2013   Cervical  strain 06/21/2013   Low back pain 06/21/2013   Metatarsalgia of both feet 03/11/2013   Sciatica 01/25/2013   Restless leg syndrome 11/24/2012   Pure hypercholesterolemia 10/20/2012   OSA on CPAP 10/20/2012   Depression 10/20/2012   Iron deficiency anemia, unspecified 10/01/2012   Snoring 10/01/2012   Diabetes mellitus, type 2 ([place]) 12/31/2010   ANAL FISSURE 12/31/2010   Morbid obesity ([place]) 01/08/2007   CONSTIPATION 01/08/2007      Laureen Abrahams, PT, DPT 06/25/21 10:26 AM     [facility] [address] [place], [place], [address] Phone: [phone]   Fax:  [phone]  Name: ATHELENE HURSEY MRN: [id number] Date of Birth: December 12, 1960

## 2021-06-26 ENCOUNTER — Other Ambulatory Visit (HOSPITAL_COMMUNITY): Payer: Self-pay

## 2021-06-26 MED ORDER — TERCONAZOLE 0.4 % VA CREA
1.0000 | TOPICAL_CREAM | Freq: Every day | VAGINAL | 3 refills | Status: AC
  Filled 2021-06-26 – 2021-07-05 (×2): qty 45, 7d supply, fill #0

## 2021-06-29 ENCOUNTER — Ambulatory Visit (INDEPENDENT_AMBULATORY_CARE_PROVIDER_SITE_OTHER): Payer: 59 | Admitting: Rehabilitative and Restorative Service Providers"

## 2021-06-29 ENCOUNTER — Other Ambulatory Visit: Payer: Self-pay

## 2021-06-29 ENCOUNTER — Encounter: Payer: Self-pay | Admitting: Rehabilitative and Restorative Service Providers"

## 2021-06-29 DIAGNOSIS — R262 Difficulty in walking, not elsewhere classified: Secondary | ICD-10-CM

## 2021-06-29 DIAGNOSIS — M25662 Stiffness of left knee, not elsewhere classified: Secondary | ICD-10-CM | POA: Diagnosis not present

## 2021-06-29 DIAGNOSIS — R600 Localized edema: Secondary | ICD-10-CM | POA: Diagnosis not present

## 2021-06-29 DIAGNOSIS — M25562 Pain in left knee: Secondary | ICD-10-CM | POA: Diagnosis not present

## 2021-06-29 DIAGNOSIS — M6281 Muscle weakness (generalized): Secondary | ICD-10-CM | POA: Diagnosis not present

## 2021-06-29 NOTE — Therapy (Signed)
[facility] [place], [place], [address] Phone: [phone]   Fax:  [phone]  Physical Therapy Treatment  Patient Details  Name: Cristina [name] MRN: [id number] Date of Birth: May 25, 1961 Referring Provider (PT): Erskine Emery, PA-C   Encounter Date: 06/29/2021   PT End of Session - 06/29/21 1156     Visit Number 4    Number of Visits 24    Date for PT Re-Evaluation 09/14/21    Authorization Type UMR    PT Start Time 1151    PT Stop Time 1237    PT Time Calculation (min) 46 min    Activity Tolerance Patient limited by pain    Behavior During Therapy [facility] for tasks assessed/performed             Past Medical History:  Diagnosis Date   Allergic rhinitis, cause unspecified    Anemia    Arthritis    Asthma    Back pain    Chest pain    Constipation    CTS (carpal tunnel syndrome)    Cystocele    Depression    Diabetes mellitus    Dysfunction of eustachian tube    Fatty liver    Fibromyalgia    Fissure, anal    Gallbladder problem    Genital herpes    GERD (gastroesophageal reflux disease)    Headache    Hemorrhoid    Hx of migraine headaches    Hyperlipidemia    IBS (irritable bowel syndrome)    Insomnia    Iron deficiency anemia, unspecified    Joint pain    Nausea    Obesity    OSA on CPAP    Osteoarthritis    Rectocele    Sleep apnea    TMJ (dislocation of temporomandibular joint)     Past Surgical History:  Procedure Laterality Date   ABDOMINAL HYSTERECTOMY  11/11/2010   [place] Plan.  Ovaries intact.  Uterine fibroids with DUB.   CARPAL TUNNEL RELEASE  11/12/1987   Left   CHOLECYSTECTOMY     CHOLECYSTECTOMY     COLONOSCOPY  06/29/2012   normal.  Repeat in 5 years.   DILATION AND CURETTAGE OF UTERUS     ECTOPIC PREGNANCY SURGERY  11/12/1991   ESOPHAGOGASTRODUODENOSCOPY  12/13/2011   dysphagia.  Henrene Pastor.  Normal.   GASTRIC ROUX-EN-Y N/A 07/06/2018   Procedure: LAPAROSCOPIC ROUX-EN-Y GASTRIC  BYPASS WITH UPPER ENDOSCOPY;  Surgeon: Excell Seltzer, MD;  Location: [facility];  Service: General;  Laterality: N/A;   Sleep study  09/11/2012   severe OSA.  CPAP titration at 12 cm water pressure.   TOTAL KNEE ARTHROPLASTY Left 05/31/2021   Procedure: LEFT TOTAL KNEE ARTHROPLASTY;  Surgeon: Mcarthur Rossetti, MD;  Location: [facility];  Service: Orthopedics;  Laterality: Left;   TUBAL LIGATION  11/11/1986    There were no vitals filed for this visit.   Subjective Assessment - 06/29/21 1154     Subjective I am not sleeping. the increased pain has nothing to do with the switch from the walker to the cane.    Currently in Pain? Yes    Pain Score 4     Pain Location Knee    Pain Orientation Left    Pain Descriptors / Indicators Sharp    Pain Type Chronic pain    Pain Radiating Towards up/down incision    Multiple Pain Sites No  [facility] Adult PT Treatment/Exercise - 06/29/21 0001       Ambulation/Gait   Gait Comments GT with SPC with concentration on using avail knee flex and heel strike      Knee/Hip Exercises: Aerobic   Nustep level 3 x 6 min bil UE/LEs while PT/pt discused pain      Knee/Hip Exercises: Seated   Other Seated Knee/Hip Exercises STS from EOM with mat in lowest position with increased WB on  L LE as tolerated      Knee/Hip Exercises: Supine   Other Supine Knee/Hip Exercises flexion: L heel slide x 10 with 10 sec hold; bridge x 10 sets with incremental increases in knee flexion per set prior to flexing knee more    Other Supine Knee/Hip Exercises extension: L quad set with R ktc to also stretch L hip flexor x 20; L quad set x 5 with 15 sec hold; SAQ x 20      Manual Therapy   Manual therapy comments supine L knee PROM flexion to pt tolerance with PT sustained hold at end ROM                      PT Short Term Goals - 06/29/21 1201       PT SHORT TERM GOAL #1   Title She will be independent with  intial HEP.    Status On-going      PT SHORT TERM GOAL #2   Title Pt will report decr pain and be  able to stand 10 min or more before increased pain    Status On-going               PT Long Term Goals - 06/18/21 1005       PT LONG TERM GOAL #1   Title Pt will be independent in her advanced HEP.    Time 12    Period Weeks    Status New    Target Date 09/14/21      PT LONG TERM GOAL #2   Title She will be able to walk for > 20 min with pain in knee to </= 3/10 with no device.    Time 12    Period Weeks    Status New    Target Date 09/14/21      PT LONG TERM GOAL #3   Title Pt will improve her left knee flexion and extension from -5 to 120 degrees    Time 12    Period Weeks    Status New    Target Date 09/14/21      PT LONG TERM GOAL #4   Title Pt will improve her left knee strength to grossly +4/5.    Time 12    Period Weeks    Status New    Target Date 09/14/21      PT LONG TERM GOAL #5   Title FOTO improved to >/= 58 % function.    Time 12    Period Weeks    Status New    Target Date 09/14/21                   Plan - 06/29/21 1157     Clinical Impression Statement Pt reports pain to be worse at night. Pt continues to have decreased L knee ROM and strength s/p L TKR. She reports pain does not correlate with transition from walker to cane and arrived to PT with SPC. Will continue to benefit  from PT for L knee ROM and strengthening. supine L knee AROm flexion: 76 degrees    PT Treatment/Interventions ADLs/Self Care Home Management;Cryotherapy;Electrical Stimulation;Moist Heat;Gait training;Stair training;Functional mobility training;Therapeutic activities;Therapeutic exercise;Balance training;Neuromuscular re-education;Patient/family education;Passive range of motion;Manual techniques;Dry needling;Taping;Vasopneumatic Device    PT Next Visit Plan High recommendation for Lt knee manual intervention for mobility gains, strengthening as tolerated to  active quad (does well with active movement and visualizing quad). next visit contract relax prone knee flexion    Consulted and Agree with Plan of Care Patient             Patient will benefit from skilled therapeutic intervention in order to improve the following deficits and impairments:  Pain, Decreased mobility, Decreased strength, Increased edema, Decreased balance, Decreased activity tolerance, Difficulty walking, Decreased range of motion, Impaired flexibility, Abnormal gait  Visit Diagnosis: Acute pain of left knee  Stiffness of left knee, not elsewhere classified  Muscle weakness (generalized)  Localized edema  Difficulty in walking, not elsewhere classified     Problem List Patient Active Problem List   Diagnosis Date Noted   Status post left knee replacement 06/01/2021   Status post total left knee replacement 05/31/2021   Unilateral primary osteoarthritis, left knee 12/21/2020   Morbid obesity with body mass index (BMI) of 45.0 to 49.9 in adult ([place]) 07/06/2018   Type 2 diabetes mellitus without complication, without long-term current use of insulin ([place]) 05/22/2017   Class 3 obesity with serious comorbidity and body mass index (BMI) of 40.0 to 44.9 in adult 05/21/2017   Family history of colon cancer 04/01/2017   Irritable bowel syndrome with diarrhea 04/01/2017   NSAID long-term use 04/01/2017   Lymphocytosis 02/24/2017   Essential hypertension 02/12/2017   Mild intermittent asthma with acute exacerbation 12/12/2016   Left shoulder pain 06/02/2015   Insomnia 05/12/2014   Fibromyalgia 05/12/2014   Allergic rhinitis 03/28/2014   Osteoarthritis 12/20/2013   Cervical strain 06/21/2013   Low back pain 06/21/2013   Metatarsalgia of both feet 03/11/2013   Sciatica 01/25/2013   Restless leg syndrome 11/24/2012   Pure hypercholesterolemia 10/20/2012   OSA on CPAP 10/20/2012   Depression 10/20/2012   Iron deficiency anemia, unspecified 10/01/2012   Snoring  10/01/2012   Diabetes mellitus, type 2 ([place]) 12/31/2010   ANAL FISSURE 12/31/2010   Morbid obesity ([place]) 01/08/2007   CONSTIPATION 01/08/2007    America Brown, PT, DPT 06/29/2021, 12:56 PM  [facility] [address] [place], [place], [address] Phone: [phone]   Fax:  [phone]  Name: TRISTEN PENNINO MRN: [id number] Date of Birth: 02-19-1961

## 2021-07-01 ENCOUNTER — Encounter: Payer: Self-pay | Admitting: Orthopaedic Surgery

## 2021-07-03 ENCOUNTER — Other Ambulatory Visit: Payer: Self-pay

## 2021-07-03 ENCOUNTER — Encounter: Payer: Self-pay | Admitting: Physical Therapy

## 2021-07-03 ENCOUNTER — Telehealth: Payer: Self-pay | Admitting: Orthopaedic Surgery

## 2021-07-03 ENCOUNTER — Ambulatory Visit (INDEPENDENT_AMBULATORY_CARE_PROVIDER_SITE_OTHER): Payer: 59 | Admitting: Physical Therapy

## 2021-07-03 DIAGNOSIS — R600 Localized edema: Secondary | ICD-10-CM | POA: Diagnosis not present

## 2021-07-03 DIAGNOSIS — M25562 Pain in left knee: Secondary | ICD-10-CM | POA: Diagnosis not present

## 2021-07-03 DIAGNOSIS — R262 Difficulty in walking, not elsewhere classified: Secondary | ICD-10-CM | POA: Diagnosis not present

## 2021-07-03 DIAGNOSIS — M6281 Muscle weakness (generalized): Secondary | ICD-10-CM | POA: Diagnosis not present

## 2021-07-03 DIAGNOSIS — M25662 Stiffness of left knee, not elsewhere classified: Secondary | ICD-10-CM

## 2021-07-03 NOTE — Therapy (Signed)
[place], [place], [address] Phone: [phone]   Fax:  [phone]  Physical Therapy Treatment  Patient Details  Name: Cristina [name] MRN: [id number] Date of Birth: [birth year] Referring Provider (PT): Erskine Emery, PA-C   Encounter Date: 07/03/2021   PT End of Session - 07/03/21 1408     Visit Number 5    Number of Visits 24    Date for PT Re-Evaluation 09/14/21    Authorization Type UMR    PT Start Time 1345    PT Stop Time 1423    PT Time Calculation (min) 38 min    Activity Tolerance Patient limited by pain    Behavior During Therapy [facility] for tasks assessed/performed             Past Medical History:  Diagnosis Date   Allergic rhinitis, cause unspecified    Anemia    Arthritis    Asthma    Back pain    Chest pain    Constipation    CTS (carpal tunnel syndrome)    Cystocele    Depression    Diabetes mellitus    Dysfunction of eustachian tube    Fatty liver    Fibromyalgia    Fissure, anal    Gallbladder problem    Genital herpes    GERD (gastroesophageal reflux disease)    Headache    Hemorrhoid    Hx of migraine headaches    Hyperlipidemia    IBS (irritable bowel syndrome)    Insomnia    Iron deficiency anemia, unspecified    Joint pain    Nausea    Obesity    OSA on CPAP    Osteoarthritis    Rectocele    Sleep apnea    TMJ (dislocation of temporomandibular joint)     Past Surgical History:  Procedure Laterality Date   ABDOMINAL HYSTERECTOMY  11/11/2010   [place] Plan.  Ovaries intact.  Uterine fibroids with DUB.   CARPAL TUNNEL RELEASE  11/12/1987   Left   CHOLECYSTECTOMY     CHOLECYSTECTOMY     COLONOSCOPY  06/29/2012   normal.  Repeat in 5 years.   DILATION AND CURETTAGE OF UTERUS     ECTOPIC PREGNANCY SURGERY  11/12/1991   ESOPHAGOGASTRODUODENOSCOPY  12/13/2011   dysphagia.  Henrene Pastor.  Normal.   GASTRIC ROUX-EN-Y N/A 07/06/2018   Procedure: LAPAROSCOPIC ROUX-EN-Y GASTRIC  BYPASS WITH UPPER ENDOSCOPY;  Surgeon: Excell Seltzer, MD;  [facility];  Service: General;  Laterality: N/A;   Sleep study  09/11/2012   severe OSA.  CPAP titration at 12 cm water pressure.   TOTAL KNEE ARTHROPLASTY Left 05/31/2021   Procedure: LEFT TOTAL KNEE ARTHROPLASTY;  Surgeon: Mcarthur Rossetti, MD;  Location: [facility];  Service: Orthopedics;  Laterality: Left;   TUBAL LIGATION  11/11/1986    There were no vitals filed for this visit.   Subjective Assessment - 07/03/21 1406     Subjective Pt reporting more stiffness today. Pt just took pain meds prior to therapy and icec her left knee.    Limitations Walking;House hold activities    Diagnostic tests X-ray    Patient Stated Goals Walk withou pain, get my leg moving    Currently in Pain? No/denies                               [facility] Adult PT Treatment/Exercise - 07/03/21 0001  Knee/Hip Exercises: Aerobic   Recumbent Bike rocking back and forth x 8 minutes      Knee/Hip Exercises: Machines for Strengthening   Cybex Leg Press 75# bilateral LE's 2x15, Lt LE ony 50# 2x15      Knee/Hip Exercises: Seated   Long Arc Quad Strengthening;Left;2 sets;10 reps    Long Arc Quad Weight 3 lbs.      Knee/Hip Exercises: Supine   Quad Sets 20 reps    Quad Sets Limitations towel under ankle with over pressure      Manual Therapy   Manual therapy comments Passive knee flexion and extension                      PT Short Term Goals - 07/03/21 1416       PT SHORT TERM GOAL #1   Title She will be independent with intial HEP.    Status On-going      PT SHORT TERM GOAL #2   Title Pt will report decr pain and be  able to stand 10 min or more before increased pain    Status On-going               PT Long Term Goals - 06/18/21 1005       PT LONG TERM GOAL #1   Title Pt will be independent in her advanced HEP.    Time 12    Period Weeks    Status New    Target Date 09/14/21       PT LONG TERM GOAL #2   Title She will be able to walk for > 20 min with pain in knee to </= 3/10 with no device.    Time 12    Period Weeks    Status New    Target Date 09/14/21      PT LONG TERM GOAL #3   Title Pt will improve her left knee flexion and extension from -5 to 120 degrees    Time 12    Period Weeks    Status New    Target Date 09/14/21      PT LONG TERM GOAL #4   Title Pt will improve her left knee strength to grossly +4/5.    Time 12    Period Weeks    Status New    Target Date 09/14/21      PT LONG TERM GOAL #5   Title FOTO improved to >/= 58 % function.    Time 12    Period Weeks    Status New    Target Date 09/14/21                   Plan - 07/03/21 1411     Clinical Impression Statement Pt arriving to clinic today reporting no pain after taking tylenol prior to therapy visit. Pt reporting more stiffness. Pt AROM, Pt attempting recumbant bike with rocking back and forth and LE strengtheing as well as active and passive ROM. Continue skilled PT to progress toward pt's LTG's to maximize funciton.    Personal Factors and Comorbidities Comorbidity 3+    Comorbidities anemia arthritis, asthma, depression, DM, fibromyalgia, back pain, GERD, HA, hemorrhoid, IBS, hyperlipidemia, Iron deficiency, obesity, sleep apnea,    Examination-Activity Limitations Stairs;Squat;Sleep;Transfers;Stand;Locomotion Level    Examination-Participation Restrictions Community Activity;Other;Occupation;Cleaning    Stability/Clinical Decision Making Stable/Uncomplicated    Rehab Potential Good    PT Frequency 2x / week    PT Duration 12  weeks    PT Treatment/Interventions ADLs/Self Care Home Management;Cryotherapy;Electrical Stimulation;Moist Heat;Gait training;Stair training;Functional mobility training;Therapeutic activities;Therapeutic exercise;Balance training;Neuromuscular re-education;Patient/family education;Passive range of motion;Manual techniques;Dry  needling;Taping;Vasopneumatic Device    PT Next Visit Plan High recommendation for Lt knee manual intervention for mobility gains, strengthening as tolerated to active quad (does well with active movement and visualizing quad). next visit contract relax prone knee flexion    PT Home Exercise Plan Access Code: [email or web address]  URL: [email or web address]  Date: 06/18/2021  Prepared by: Kearney Hard    Exercises  Supine Heel Slide - 3-4 x daily - 7 x weekly - 2 sets - 10 reps  Seated Knee Flexion Extension AROM - 3-4 x daily - 7 x weekly - 2 sets - 10 reps  Supine Active Straight Leg Raise - 3-4 x daily - 7 x weekly - 2 sets - 10 reps  Sit to Stand with Counter Support - 3-4 x daily - 7 x weekly - 2 sets - 10 reps  Seated Long Arc Quad with Hip Adduction - 3-4 x daily - 7 x weekly - 3 sets - 10 reps  Prone Knee Extension Hang - 3 x daily - 7 x weekly - build up to 10 minute holds hold  Supine Quad Set - 3-4 x daily - 7 x weekly - 2 sets - 10 reps - 5 seconds hold    Consulted and Agree with Plan of Care Patient             Patient will benefit from skilled therapeutic intervention in order to improve the following deficits and impairments:  Pain, Decreased mobility, Decreased strength, Increased edema, Decreased balance, Decreased activity tolerance, Difficulty walking, Decreased range of motion, Impaired flexibility, Abnormal gait  Visit Diagnosis: Acute pain of left knee  Stiffness of left knee, not elsewhere classified  Muscle weakness (generalized)  Localized edema  Difficulty in walking, not elsewhere classified     Problem List Patient Active Problem List   Diagnosis Date Noted   Status post left knee replacement 06/01/2021   Status post total left knee replacement 05/31/2021   Unilateral primary osteoarthritis, left knee 12/21/2020   Morbid obesity with body mass index (BMI) of 45.0 to 49.9 in adult ([place]) 07/06/2018   Type 2 diabetes mellitus without complication,  without long-term current use of insulin ([facility]) 05/22/2017   Class 3 obesity with serious comorbidity and body mass index (BMI) of 40.0 to 44.9 in adult 05/21/2017   Family history of colon cancer 04/01/2017   Irritable bowel syndrome with diarrhea 04/01/2017   NSAID long-term use 04/01/2017   Lymphocytosis 02/24/2017   Essential hypertension 02/12/2017   Mild intermittent asthma with acute exacerbation 12/12/2016   Left shoulder pain 06/02/2015   Insomnia 05/12/2014   Fibromyalgia 05/12/2014   Allergic rhinitis 03/28/2014   Osteoarthritis 12/20/2013   Cervical strain 06/21/2013   Low back pain 06/21/2013   Metatarsalgia of both feet 03/11/2013   Sciatica 01/25/2013   Restless leg syndrome 11/24/2012   Pure hypercholesterolemia 10/20/2012   OSA on CPAP 10/20/2012   Depression 10/20/2012   Iron deficiency anemia, unspecified 10/01/2012   Snoring 10/01/2012   Diabetes mellitus, type 2 ([place]) 12/31/2010   ANAL FISSURE 12/31/2010   Morbid obesity ([place]) 01/08/2007   CONSTIPATION 01/08/2007    Oretha Caprice, PT, MPT 07/03/2021, 2:28 PM  [facility] [address] [place], [place], [address] Phone: [phone]   Fax:  [phone]  Name: Cristina [name] MRN: [id number] Date of  Birth: 1961/06/24

## 2021-07-03 NOTE — Telephone Encounter (Signed)
Pt is here for a PT appt and would like to know if she could put lotion on where she had surgery? I can just let her know or her cb is [phone].

## 2021-07-03 NOTE — Telephone Encounter (Signed)
Patient aware of below message

## 2021-07-04 ENCOUNTER — Other Ambulatory Visit (HOSPITAL_COMMUNITY): Payer: Self-pay

## 2021-07-05 ENCOUNTER — Other Ambulatory Visit (HOSPITAL_COMMUNITY): Payer: Self-pay

## 2021-07-06 ENCOUNTER — Encounter: Payer: 59 | Admitting: Rehabilitative and Restorative Service Providers"

## 2021-07-09 ENCOUNTER — Telehealth: Payer: Self-pay | Admitting: Orthopaedic Surgery

## 2021-07-09 ENCOUNTER — Encounter: Payer: Self-pay | Admitting: Orthopaedic Surgery

## 2021-07-09 NOTE — Telephone Encounter (Signed)
[place] forms received. To [employer]. ?

## 2021-07-10 ENCOUNTER — Other Ambulatory Visit: Payer: Self-pay

## 2021-07-10 ENCOUNTER — Ambulatory Visit (INDEPENDENT_AMBULATORY_CARE_PROVIDER_SITE_OTHER): Payer: 59 | Admitting: Physical Therapy

## 2021-07-10 ENCOUNTER — Encounter: Payer: Self-pay | Admitting: Physical Therapy

## 2021-07-10 DIAGNOSIS — M25662 Stiffness of left knee, not elsewhere classified: Secondary | ICD-10-CM | POA: Diagnosis not present

## 2021-07-10 DIAGNOSIS — R600 Localized edema: Secondary | ICD-10-CM | POA: Diagnosis not present

## 2021-07-10 DIAGNOSIS — M6281 Muscle weakness (generalized): Secondary | ICD-10-CM | POA: Diagnosis not present

## 2021-07-10 DIAGNOSIS — R262 Difficulty in walking, not elsewhere classified: Secondary | ICD-10-CM | POA: Diagnosis not present

## 2021-07-10 DIAGNOSIS — M25562 Pain in left knee: Secondary | ICD-10-CM

## 2021-07-10 NOTE — Telephone Encounter (Signed)
Patient portal message sent to pt.

## 2021-07-10 NOTE — Telephone Encounter (Signed)
Would you be willing to come in on September 8th in the afternoon?

## 2021-07-10 NOTE — Therapy (Signed)
[place], [place], [address] Phone: [phone]   Fax:  [phone]  Physical Therapy Treatment  Patient Details  Name: Cristina [name] MRN: [id number] Date of Birth: 05/16/1961 Referring Provider (PT): Erskine Emery PA-C   Encounter Date: 07/10/2021   PT End of Session - 07/10/21 1157     Visit Number 6    Number of Visits 24    Date for PT Re-Evaluation 09/14/21    Authorization Type UMR    PT Start Time 1150    PT Stop Time 1228    PT Time Calculation (min) 38 min    Activity Tolerance Patient limited by pain    Behavior During Therapy [facility] for tasks assessed/performed             Past Medical History:  Diagnosis Date   Allergic rhinitis, cause unspecified    Anemia    Arthritis    Asthma    Back pain    Chest pain    Constipation    CTS (carpal tunnel syndrome)    Cystocele    Depression    Diabetes mellitus    Dysfunction of eustachian tube    Fatty liver    Fibromyalgia    Fissure, anal    Gallbladder problem    Genital herpes    GERD (gastroesophageal reflux disease)    Headache    Hemorrhoid    Hx of migraine headaches    Hyperlipidemia    IBS (irritable bowel syndrome)    Insomnia    Iron deficiency anemia, unspecified    Joint pain    Nausea    Obesity    OSA on CPAP    Osteoarthritis    Rectocele    Sleep apnea    TMJ (dislocation of temporomandibular joint)     Past Surgical History:  Procedure Laterality Date   ABDOMINAL HYSTERECTOMY  11/11/2010   [place] Plan.  Ovaries intact.  Uterine fibroids with DUB.   CARPAL TUNNEL RELEASE  11/12/1987   Left   CHOLECYSTECTOMY     CHOLECYSTECTOMY     COLONOSCOPY  06/29/2012   normal.  Repeat in 5 years.   DILATION AND CURETTAGE OF UTERUS     ECTOPIC PREGNANCY SURGERY  11/12/1991   ESOPHAGOGASTRODUODENOSCOPY  12/13/2011   dysphagia.  Henrene Pastor.  Normal.   GASTRIC ROUX-EN-Y N/A 07/06/2018   Procedure: LAPAROSCOPIC ROUX-EN-Y GASTRIC  BYPASS WITH UPPER ENDOSCOPY;  Surgeon: Excell Seltzer, MD;  Location: [facility];  Service: General;  Laterality: N/A;   Sleep study  09/11/2012   severe OSA.  CPAP titration at 12 cm water pressure.   TOTAL KNEE ARTHROPLASTY Left 05/31/2021   Procedure: LEFT TOTAL KNEE ARTHROPLASTY;  Surgeon: Mcarthur Rossetti, MD;  Location: [place];  Service: Orthopedics;  Laterality: Left;   TUBAL LIGATION  11/11/1986    There were no vitals filed for this visit.   Subjective Assessment - 07/10/21 1156     Subjective Pt arriving to therapy reporting stiffness in left knee.    Limitations Walking;House hold activities    Diagnostic tests X-ray    Patient Stated Goals Walk withou pain, get my leg moving    Currently in Pain? Yes    Pain Score 2     Pain Location Knee    Pain Orientation Left    Pain Descriptors / Indicators Tingling    Pain Type Surgical pain    Pain Onset More than a month ago  [facility] PT Assessment - 07/10/21 0001       Assessment   Medical Diagnosis left TKA    Referring Provider (PT) Erskine Emery PA-C    Onset Date/Surgical Date 05/31/21      AROM   AROM Assessment Site Knee    Right/Left Knee Left    Left Knee Extension -18    Left Knee Flexion 85      PROM   PROM Assessment Site Knee    Right/Left Knee Left    Left Knee Extension -12    Left Knee Flexion 90      Ambulation/Gait   Gait Comments Pt amb with st cane on level surfaces with decreased left knee flexion/extension.                           [place] Adult PT Treatment/Exercise - 07/10/21 0001       Knee/Hip Exercises: Aerobic   Recumbent Bike rocking back and forth seat at 5 x 8 minutes      Knee/Hip Exercises: Machines for Strengthening   Cybex Leg Press bilateral Le's 75# 3x15, Lt LE only: 37# 3 x15      Knee/Hip Exercises: Standing   Other Standing Knee Exercises TRX squats 2x10      Knee/Hip Exercises: Seated   Long Arc Quad Strengthening;Left;2  sets;10 reps    Long Arc Quad Weight 4 lbs.      Knee/Hip Exercises: Supine   Quad Sets 20 reps    Quad Sets Limitations towel under ankle with overpressure by therapist      Manual Therapy   Manual therapy comments Passive knee flexion and extension, tail gait knee flexion with IR and distraction with flexion                      PT Short Term Goals - 07/03/21 1416       PT SHORT TERM GOAL #1   Title She will be independent with intial HEP.    Status On-going      PT SHORT TERM GOAL #2   Title Pt will report decr pain and be  able to stand 10 min or more before increased pain    Status On-going               PT Long Term Goals - 06/18/21 1005       PT LONG TERM GOAL #1   Title Pt will be independent in her advanced HEP.    Time 12    Period Weeks    Status New    Target Date 09/14/21      PT LONG TERM GOAL #2   Title She will be able to walk for > 20 min with pain in knee to </= 3/10 with no device.    Time 12    Period Weeks    Status New    Target Date 09/14/21      PT LONG TERM GOAL #3   Title Pt will improve her left knee flexion and extension from -5 to 120 degrees    Time 12    Period Weeks    Status New    Target Date 09/14/21      PT LONG TERM GOAL #4   Title Pt will improve her left knee strength to grossly +4/5.    Time 12    Period Weeks    Status New    Target Date 09/14/21  PT LONG TERM GOAL #5   Title FOTO improved to >/= 58 % function.    Time 12    Period Weeks    Status New    Target Date 09/14/21                   Plan - 07/10/21 1157     Clinical Impression Statement Pt arriving today reporting more stiffness in her left knee of 2/10. Pt reporting compliance in her HEP, ice with elevation. Pt has a recumbant bike at home and pt was instructed to start moving seat closer as she is able to perform a full revolution, Pt still partial revolutions here in clinic. Treatment focused on knee ROM and strength.  Continue skilled PT to maximize function.    Personal Factors and Comorbidities Comorbidity 3+    Comorbidities anemia arthritis, asthma, depression, DM, fibromyalgia, back pain, GERD, HA, hemorrhoid, IBS, hyperlipidemia, Iron deficiency, obesity, sleep apnea,    Examination-Activity Limitations Stairs;Squat;Sleep;Transfers;Stand;Locomotion Level    Examination-Participation Restrictions Community Activity;Other;Occupation;Cleaning    Stability/Clinical Decision Making Stable/Uncomplicated    Rehab Potential Good    PT Frequency 2x / week    PT Duration 12 weeks    PT Treatment/Interventions ADLs/Self Care Home Management;Cryotherapy;Electrical Stimulation;Moist Heat;Gait training;Stair training;Functional mobility training;Therapeutic activities;Therapeutic exercise;Balance training;Neuromuscular re-education;Patient/family education;Passive range of motion;Manual techniques;Dry needling;Taping;Vasopneumatic Device    PT Next Visit Plan continue c Lt knee manual intervention for mobility gains, strengthening as tolerated to active quad (does well with active movement and visualizing quad). next visit contract relax prone knee flexion    PT Home Exercise Plan Access Code: [email or web address]  URL: [email or web address]  Date: 06/18/2021  Prepared by: Kearney Hard    Exercises  Supine Heel Slide - 3-4 x daily - 7 x weekly - 2 sets - 10 reps  Seated Knee Flexion Extension AROM - 3-4 x daily - 7 x weekly - 2 sets - 10 reps  Supine Active Straight Leg Raise - 3-4 x daily - 7 x weekly - 2 sets - 10 reps  Sit to Stand with Counter Support - 3-4 x daily - 7 x weekly - 2 sets - 10 reps  Seated Long Arc Quad with Hip Adduction - 3-4 x daily - 7 x weekly - 3 sets - 10 reps  Prone Knee Extension Hang - 3 x daily - 7 x weekly - build up to 10 minute holds hold  Supine Quad Set - 3-4 x daily - 7 x weekly - 2 sets - 10 reps - 5 seconds hold    Consulted and Agree with Plan of Care Patient              Patient will benefit from skilled therapeutic intervention in order to improve the following deficits and impairments:  Pain, Decreased mobility, Decreased strength, Increased edema, Decreased balance, Decreased activity tolerance, Difficulty walking, Decreased range of motion, Impaired flexibility, Abnormal gait  Visit Diagnosis: Acute pain of left knee  Stiffness of left knee, not elsewhere classified  Muscle weakness (generalized)  Localized edema  Difficulty in walking, not elsewhere classified     Problem List Patient Active Problem List   Diagnosis Date Noted   Status post left knee replacement 06/01/2021   Status post total left knee replacement 05/31/2021   Unilateral primary osteoarthritis, left knee 12/21/2020   Morbid obesity with body mass index (BMI) of 45.0 to 49.9 in adult ([place]) 07/06/2018   Type 2 diabetes mellitus without complication, without long-term current use of  insulin ([place]) 05/22/2017   Class 3 obesity with serious comorbidity and body mass index (BMI) of 40.0 to 44.9 in adult 05/21/2017   Family history of colon cancer 04/01/2017   Irritable bowel syndrome with diarrhea 04/01/2017   NSAID long-term use 04/01/2017   Lymphocytosis 02/24/2017   Essential hypertension 02/12/2017   Mild intermittent asthma with acute exacerbation 12/12/2016   Left shoulder pain 06/02/2015   Insomnia 05/12/2014   Fibromyalgia 05/12/2014   Allergic rhinitis 03/28/2014   Osteoarthritis 12/20/2013   Cervical strain 06/21/2013   Low back pain 06/21/2013   Metatarsalgia of both feet 03/11/2013   Sciatica 01/25/2013   Restless leg syndrome 11/24/2012   Pure hypercholesterolemia 10/20/2012   OSA on CPAP 10/20/2012   Depression 10/20/2012   Iron deficiency anemia, unspecified 10/01/2012   Snoring 10/01/2012   Diabetes mellitus, type 2 ([place]) 12/31/2010   ANAL FISSURE 12/31/2010   Morbid obesity ([place]) 01/08/2007   CONSTIPATION 01/08/2007    Oretha Caprice, PT,  MPT 07/10/2021, 12:31 PM  [facility] [address] [place], [place], [address] Phone: [phone]   Fax:  [phone]  Name: Cristina [name] MRN: [id number] Date of Birth: May 18, 1961

## 2021-07-11 ENCOUNTER — Encounter: Payer: Self-pay | Admitting: Orthopaedic Surgery

## 2021-07-11 ENCOUNTER — Ambulatory Visit (INDEPENDENT_AMBULATORY_CARE_PROVIDER_SITE_OTHER): Payer: 59 | Admitting: Rehabilitative and Restorative Service Providers"

## 2021-07-11 ENCOUNTER — Encounter: Payer: Self-pay | Admitting: Rehabilitative and Restorative Service Providers"

## 2021-07-11 DIAGNOSIS — M25562 Pain in left knee: Secondary | ICD-10-CM | POA: Diagnosis not present

## 2021-07-11 DIAGNOSIS — M25662 Stiffness of left knee, not elsewhere classified: Secondary | ICD-10-CM

## 2021-07-11 DIAGNOSIS — R600 Localized edema: Secondary | ICD-10-CM | POA: Diagnosis not present

## 2021-07-11 DIAGNOSIS — M6281 Muscle weakness (generalized): Secondary | ICD-10-CM

## 2021-07-11 DIAGNOSIS — R262 Difficulty in walking, not elsewhere classified: Secondary | ICD-10-CM

## 2021-07-11 NOTE — Therapy (Signed)
[place], [place], [address] Phone: [phone]   Fax:  [phone]  Physical Therapy Treatment  Patient Details  Name: Cristina [name] MRN: [id number] Date of Birth: [birth year] Referring Provider (PT): Erskine Emery PA-C   Encounter Date: 07/11/2021   PT End of Session - 07/11/21 1334     Visit Number 7    Number of Visits 24    Date for PT Re-Evaluation 09/14/21    Authorization Type UMR    PT Start Time 1307    PT Stop Time 9798    PT Time Calculation (min) 38 min    Activity Tolerance Patient tolerated treatment well    Behavior During Therapy [facility] for tasks assessed/performed             Past Medical History:  Diagnosis Date   Allergic rhinitis, cause unspecified    Anemia    Arthritis    Asthma    Back pain    Chest pain    Constipation    CTS (carpal tunnel syndrome)    Cystocele    Depression    Diabetes mellitus    Dysfunction of eustachian tube    Fatty liver    Fibromyalgia    Fissure, anal    Gallbladder problem    Genital herpes    GERD (gastroesophageal reflux disease)    Headache    Hemorrhoid    Hx of migraine headaches    Hyperlipidemia    IBS (irritable bowel syndrome)    Insomnia    Iron deficiency anemia, unspecified    Joint pain    Nausea    Obesity    OSA on CPAP    Osteoarthritis    Rectocele    Sleep apnea    TMJ (dislocation of temporomandibular joint)     Past Surgical History:  Procedure Laterality Date   ABDOMINAL HYSTERECTOMY  11/11/2010   [place] Plan.  Ovaries intact.  Uterine fibroids with DUB.   CARPAL TUNNEL RELEASE  11/12/1987   Left   CHOLECYSTECTOMY     CHOLECYSTECTOMY     COLONOSCOPY  06/29/2012   normal.  Repeat in 5 years.   DILATION AND CURETTAGE OF UTERUS     ECTOPIC PREGNANCY SURGERY  11/12/1991   ESOPHAGOGASTRODUODENOSCOPY  12/13/2011   dysphagia.  Henrene Pastor.  Normal.   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  CPAP titration at 12 cm water pressure.   TOTAL KNEE ARTHROPLASTY Left 05/31/2021   Procedure: LEFT TOTAL KNEE ARTHROPLASTY;  Surgeon: Mcarthur Rossetti, MD;  Location: [place];  Service: Orthopedics;  Laterality: Left;   TUBAL LIGATION  11/11/1986    There were no vitals filed for this visit.   Subjective Assessment - 07/11/21 1311     Subjective Cristina [name] reports 2X/day HEP with emphasis on AROM.  AROM is the heavy emphasis with her home and clinic program.    Limitations Walking;House hold activities    How long can you sit comfortably? Stiffness when getting up.    How long can you stand comfortably? 30 minutes    How long can you walk comfortably? 5 minutes    Diagnostic tests X-ray    Patient Stated Goals Walk without pain, get my leg moving    Currently in Pain? No/denies  Pain Score 0-No pain    Pain Location Knee    Pain Orientation Left    Pain Descriptors / Indicators Tightness    Pain Type Surgical pain    Pain Radiating Towards Sciatica to B knees (unrelated to the TKA)    Pain Onset More than a month ago    Pain Frequency Intermittent    Aggravating Factors  End range AROM is stiff    Effect of Pain on Daily Activities Using cane outside the house and limited AROM with all ADLs    Multiple Pain Sites No                               [facility] Adult PT Treatment/Exercise - 07/11/21 0001       Exercises   Exercises Knee/Hip      Knee/Hip Exercises: Stretches   Other Knee/Hip Stretches Seated knee extension with self-overpressure 3 minutes      Knee/Hip Exercises: Aerobic   Recumbent Bike Seat 5 for AAROM for 8 minutes      Knee/Hip Exercises: Machines for Strengthening   Cybex Leg Press 81# DL push into full extension and hang 5 seconds at full flexion with slow eccentrics 2  sets of 15      Knee/Hip Exercises: Seated   Long Arc Quad Strengthening;Both;2 sets;5 reps    Long Arc Quad Limitations Seated SLR    Other Seated Knee/Hip Exercises Tailgate knee flexion 1 minute    Other Seated Knee/Hip Exercises Seated knee flexion AAROM 10X 10 seconds      Knee/Hip Exercises: Prone   Other Prone Exercises Prone knee extension stretch 3 minutes with 5#                    PT Education - 07/11/21 1330     Education Details Reviewed emphasis on knee extension and flexion AROM and AAROM at home (prone and seated leg hang, tailgate knee flexion and AAROM knee flexion) multiple times per day.    Person(s) Educated Patient    Methods Explanation;Demonstration;Verbal cues    Comprehension Verbal cues required;Returned demonstration;Need further instruction;Verbalized understanding              PT Short Term Goals - 07/11/21 1332       PT SHORT TERM GOAL #1   Title She will be independent with intial HEP.    Status Achieved      PT SHORT TERM GOAL #2   Title Pt will report decr pain and be  able to stand 10 min or more before increased pain    Baseline 30 minutes    Status Achieved               PT Long Term Goals - 07/11/21 1647       PT LONG TERM GOAL #1   Title Pt will be independent in her advanced HEP.    Time 12    Period Weeks    Status On-going      PT LONG TERM GOAL #2   Title She will be able to walk for > 20 min with pain in knee to </= 3/10 with no device.    Time 12    Period Weeks    Status On-going      PT LONG TERM GOAL #3   Title Pt will improve her left knee flexion and extension from -5 to 120 degrees    Time  12    Period Weeks    Status On-going      PT LONG TERM GOAL #4   Title Pt will improve her left knee strength to grossly +4/5.    Time 12    Period Weeks    Status On-going      PT LONG TERM GOAL #5   Title FOTO improved to >/= 58 % function.    Time 12    Period Weeks    Status On-going                    Plan - 07/11/21 1338     Clinical Impression Statement Cristina [name] reports good 2X/day HEP compliance.  We discussed the advantages of 5X/day or more HEP compliance given her limited AROM.  Emphasis remains on AROM (extension and flexion).    Personal Factors and Comorbidities Comorbidity 3+    Comorbidities anemia arthritis, asthma, depression, DM, fibromyalgia, back pain, GERD, HA, hemorrhoid, IBS, hyperlipidemia, Iron deficiency, obesity, sleep apnea,    Examination-Activity Limitations Stairs;Squat;Sleep;Transfers;Stand;Locomotion Level    Examination-Participation Restrictions Community Activity;Other;Occupation;Cleaning    Stability/Clinical Decision Making Stable/Uncomplicated    Rehab Potential Good    PT Frequency 2x / week    PT Duration 12 weeks    PT Treatment/Interventions ADLs/Self Care Home Management;Cryotherapy;Electrical Stimulation;Moist Heat;Gait training;Stair training;Functional mobility training;Therapeutic activities;Therapeutic exercise;Balance training;Neuromuscular re-education;Patient/family education;Passive range of motion;Manual techniques;Dry needling;Taping;Vasopneumatic Device    PT Next Visit Plan AROM and AAROM    PT Home Exercise Plan Access Code: [email or web address]  URL: [email or web address]  Date: 06/18/2021  Prepared by: Kearney Hard    Exercises  Supine Heel Slide - 3-4 x daily - 7 x weekly - 2 sets - 10 reps  Seated Knee Flexion Extension AROM - 3-4 x daily - 7 x weekly - 2 sets - 10 reps  Supine Active Straight Leg Raise - 3-4 x daily - 7 x weekly - 2 sets - 10 reps  Sit to Stand with Counter Support - 3-4 x daily - 7 x weekly - 2 sets - 10 reps  Seated Long Arc Quad with Hip Adduction - 3-4 x daily - 7 x weekly - 3 sets - 10 reps  Prone Knee Extension Hang - 3 x daily - 7 x weekly - build up to 10 minute holds hold  Supine Quad Set - 3-4 x daily - 7 x weekly - 2 sets - 10 reps - 5 seconds hold    Consulted and Agree with Plan of Care  Patient             Patient will benefit from skilled therapeutic intervention in order to improve the following deficits and impairments:  Pain, Decreased mobility, Decreased strength, Increased edema, Decreased balance, Decreased activity tolerance, Difficulty walking, Decreased range of motion, Impaired flexibility, Abnormal gait  Visit Diagnosis: Difficulty in walking, not elsewhere classified  Muscle weakness (generalized)  Localized edema  Stiffness of left knee, not elsewhere classified  Acute pain of left knee     Problem List Patient Active Problem List   Diagnosis Date Noted   Status post left knee replacement 06/01/2021   Status post total left knee replacement 05/31/2021   Unilateral primary osteoarthritis, left knee 12/21/2020   Morbid obesity with body mass index (BMI) of 45.0 to 49.9 in adult ([place]) 07/06/2018   Type 2 diabetes mellitus without complication, without long-term current use of insulin ([facility]) 05/22/2017   Class 3 obesity with serious comorbidity and body mass index (BMI)  of 40.0 to 44.9 in adult 05/21/2017   Family history of colon cancer 04/01/2017   Irritable bowel syndrome with diarrhea 04/01/2017   NSAID long-term use 04/01/2017   Lymphocytosis 02/24/2017   Essential hypertension 02/12/2017   Mild intermittent asthma with acute exacerbation 12/12/2016   Left shoulder pain 06/02/2015   Insomnia 05/12/2014   Fibromyalgia 05/12/2014   Allergic rhinitis 03/28/2014   Osteoarthritis 12/20/2013   Cervical strain 06/21/2013   Low back pain 06/21/2013   Metatarsalgia of both feet 03/11/2013   Sciatica 01/25/2013   Restless leg syndrome 11/24/2012   Pure hypercholesterolemia 10/20/2012   OSA on CPAP 10/20/2012   Depression 10/20/2012   Iron deficiency anemia, unspecified 10/01/2012   Snoring 10/01/2012   Diabetes mellitus, type 2 ([place]) 12/31/2010   ANAL FISSURE 12/31/2010   Morbid obesity ([place]) 01/08/2007   CONSTIPATION 01/08/2007    Farley Ly PT, MPT 07/11/2021, 4:48 PM  [facility] [address] [place], [place], [address] Phone: [phone]   Fax:  [phone]  Name: YAZMEN BRIONES MRN: [id number] Date of Birth: 1961/02/26

## 2021-07-11 NOTE — Patient Instructions (Signed)
Focus on knee AROM at home (extension and flexion) multiple times per day

## 2021-07-12 ENCOUNTER — Encounter: Payer: 59 | Admitting: Physician Assistant

## 2021-07-12 ENCOUNTER — Telehealth: Payer: Self-pay | Admitting: Orthopaedic Surgery

## 2021-07-12 ENCOUNTER — Encounter: Payer: 59 | Admitting: Physical Therapy

## 2021-07-12 NOTE — Telephone Encounter (Signed)
Pt called requesting a return call from [facility]. Please call pt at [phone].

## 2021-07-12 NOTE — Telephone Encounter (Signed)
Patient aware note was faxed to both [facility] and [facility]

## 2021-07-13 ENCOUNTER — Other Ambulatory Visit: Payer: Self-pay | Admitting: Orthopaedic Surgery

## 2021-07-13 ENCOUNTER — Other Ambulatory Visit (HOSPITAL_COMMUNITY): Payer: Self-pay

## 2021-07-13 ENCOUNTER — Encounter: Payer: Self-pay | Admitting: Orthopaedic Surgery

## 2021-07-13 MED ORDER — OXYCODONE HCL 5 MG PO TABS
5.0000 mg | ORAL_TABLET | Freq: Four times a day (QID) | ORAL | 0 refills | Status: DC | PRN
  Filled 2021-07-13: qty 30, 4d supply, fill #0

## 2021-07-13 NOTE — Telephone Encounter (Signed)
ok 

## 2021-07-17 ENCOUNTER — Encounter: Payer: Self-pay | Admitting: Orthopaedic Surgery

## 2021-07-17 ENCOUNTER — Other Ambulatory Visit: Payer: Self-pay | Admitting: Orthopaedic Surgery

## 2021-07-17 ENCOUNTER — Encounter: Payer: 59 | Admitting: Physical Therapy

## 2021-07-17 ENCOUNTER — Other Ambulatory Visit (HOSPITAL_COMMUNITY): Payer: Self-pay

## 2021-07-17 MED ORDER — METHOCARBAMOL 500 MG PO TABS
500.0000 mg | ORAL_TABLET | Freq: Four times a day (QID) | ORAL | 0 refills | Status: DC | PRN
  Filled 2021-07-17: qty 60, 15d supply, fill #0

## 2021-07-18 ENCOUNTER — Encounter: Payer: Self-pay | Admitting: Rehabilitative and Restorative Service Providers"

## 2021-07-18 ENCOUNTER — Ambulatory Visit (INDEPENDENT_AMBULATORY_CARE_PROVIDER_SITE_OTHER): Payer: 59 | Admitting: Rehabilitative and Restorative Service Providers"

## 2021-07-18 ENCOUNTER — Other Ambulatory Visit: Payer: Self-pay

## 2021-07-18 DIAGNOSIS — M25562 Pain in left knee: Secondary | ICD-10-CM

## 2021-07-18 DIAGNOSIS — R600 Localized edema: Secondary | ICD-10-CM

## 2021-07-18 DIAGNOSIS — M25662 Stiffness of left knee, not elsewhere classified: Secondary | ICD-10-CM

## 2021-07-18 DIAGNOSIS — M6281 Muscle weakness (generalized): Secondary | ICD-10-CM

## 2021-07-18 DIAGNOSIS — R262 Difficulty in walking, not elsewhere classified: Secondary | ICD-10-CM

## 2021-07-18 NOTE — Therapy (Signed)
[place], [place], [address] Phone: [phone]   Fax:  [phone]  Physical Therapy Treatment  Patient Details  Name: Cristina [name] MRN: [id number] Date of Birth: 1961-09-01 Referring Provider (PT): Erskine Emery PA-C   Encounter Date: 07/18/2021   PT End of Session - 07/18/21 1016     Visit Number 8    Number of Visits 24    Date for PT Re-Evaluation 09/14/21    Authorization Type UMR    PT Start Time 1011    PT Stop Time 1050    PT Time Calculation (min) 39 min    Activity Tolerance Patient tolerated treatment well    Behavior During Therapy WFL for tasks assessed/performed             Past Medical History:  Diagnosis Date   Allergic rhinitis, cause unspecified    Anemia    Arthritis    Asthma    Back pain    Chest pain    Constipation    CTS (carpal tunnel syndrome)    Cystocele    Depression    Diabetes mellitus    Dysfunction of eustachian tube    Fatty liver    Fibromyalgia    Fissure, anal    Gallbladder problem    Genital herpes    GERD (gastroesophageal reflux disease)    Headache    Hemorrhoid    Hx of migraine headaches    Hyperlipidemia    IBS (irritable bowel syndrome)    Insomnia    Iron deficiency anemia, unspecified    Joint pain    Nausea    Obesity    OSA on CPAP    Osteoarthritis    Rectocele    Sleep apnea    TMJ (dislocation of temporomandibular joint)     Past Surgical History:  Procedure Laterality Date   ABDOMINAL HYSTERECTOMY  11/11/2010   [place] Plan.  Ovaries intact.  Uterine fibroids with DUB.   CARPAL TUNNEL RELEASE  11/12/1987   Left   CHOLECYSTECTOMY     CHOLECYSTECTOMY     COLONOSCOPY  06/29/2012   normal.  Repeat in 5 years.   DILATION AND CURETTAGE OF UTERUS     ECTOPIC PREGNANCY SURGERY  11/12/1991   ESOPHAGOGASTRODUODENOSCOPY  12/13/2011   dysphagia.  Henrene Pastor.  Normal.   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  CPAP titration at 12 cm water pressure.   TOTAL KNEE ARTHROPLASTY Left 05/31/2021   Procedure: LEFT TOTAL KNEE ARTHROPLASTY;  Surgeon: Mcarthur Rossetti, MD;  Location: [facility];  Service: Orthopedics;  Laterality: Left;   TUBAL LIGATION  11/11/1986    There were no vitals filed for this visit.   Subjective Assessment - 07/18/21 1014     Subjective Pt. indicated feeling a tough time with the surgery.  Medial knee joint most noted and stiffness.  Rated 4/10 today.    Limitations Walking;House hold activities    How long can you sit comfortably? Stiffness when getting up.    How long can you stand comfortably? 30 minutes    How long can you walk comfortably? 5 minutes    Diagnostic tests X-ray    Patient Stated Goals Walk without pain, get my leg moving    Currently in Pain? Yes  Pain Location Knee    Pain Orientation Left    Pain Descriptors / Indicators Tightness;Sore;Aching    Pain Onset More than a month ago    Pain Frequency Intermittent    Aggravating Factors  Stiffness c static positioning    Pain Relieving Factors movement helps stiffness some                [facility] PT Assessment - 07/18/21 0001       Assessment   Medical Diagnosis left TKA    Referring Provider (PT) Erskine Emery PA-C    Onset Date/Surgical Date 05/31/21    Hand Dominance Right      AROM   Left Knee Extension -20   in seated LAQ, in supine heel prop quad set : -15   Left Knee Flexion 100   in supine heel slide     Ambulation/Gait   Gait Comments SPC use, lacking TKE noted on Lt in stance                           [facility] Adult PT Treatment/Exercise - 07/18/21 0001       Knee/Hip Exercises: Stretches   Passive Hamstring Stretch 60 seconds;3 reps;Left   on shuttle leg press between sets   Gastroc Stretch 60  seconds;3 reps;Both   incilne board   Other Knee/Hip Stretches supine heel prop 5 mins for passive extension (detailed use for home, able to perform prone hang or heel prop)      Knee/Hip Exercises: Aerobic   Recumbent Bike Seat 5, Full revolution 6 mins      Knee/Hip Exercises: Machines for Strengthening   Cybex Leg Press Double leg 81 lbs 2 x 15, Lt single leg 2 x 15 37 lbs      Knee/Hip Exercises: Standing   Forward Step Up 2 sets;10 reps;Left;Step Height: 4";Hand Hold: 1      Knee/Hip Exercises: Seated   Long Arc Quad Left;AROM   c contralateral leg movement opposite x 15 after heel prop   Other Seated Knee/Hip Exercises Tailgate knee flexion 10 sec x 5 on Lt      Manual Therapy   Manual therapy comments Lt knee flexion mobilization c movement c IR/distraction                     PT Short Term Goals - 07/11/21 1332       PT SHORT TERM GOAL #1   Title She will be independent with intial HEP.    Status Achieved      PT SHORT TERM GOAL #2   Title Pt will report decr pain and be  able to stand 10 min or more before increased pain    Baseline 30 minutes    Status Achieved               PT Long Term Goals - 07/11/21 1647       PT LONG TERM GOAL #1   Title Pt will be independent in her advanced HEP.    Time 12    Period Weeks    Status On-going      PT LONG TERM GOAL #2   Title She will be able to walk for > 20 min with pain in knee to </= 3/10 with no device.    Time 12    Period Weeks    Status On-going      PT LONG TERM GOAL #3  Title Pt will improve her left knee flexion and extension from -5 to 120 degrees    Time 12    Period Weeks    Status On-going      PT LONG TERM GOAL #4   Title Pt will improve her left knee strength to grossly +4/5.    Time 12    Period Weeks    Status On-going      PT LONG TERM GOAL #5   Title FOTO improved to >/= 58 % function.    Time 12    Period Weeks    Status On-going                    Plan - 07/18/21 1022     Clinical Impression Statement Full extension lacking most prominent in functional mobility and ambulation at this time.   Provided cues for extending static stretching in heel prop, gastroc stretch and other extension intervention to promote improved mobility while continued strengthening.  Encouraged routine every 1-2 hours of heel prop or prone hang extension stretching.    Personal Factors and Comorbidities Comorbidity 3+    Comorbidities anemia arthritis, asthma, depression, DM, fibromyalgia, back pain, GERD, HA, hemorrhoid, IBS, hyperlipidemia, Iron deficiency, obesity, sleep apnea,    Examination-Activity Limitations Stairs;Squat;Sleep;Transfers;Stand;Locomotion Level    Examination-Participation Restrictions Community Activity;Other;Occupation;Cleaning    Stability/Clinical Decision Making Stable/Uncomplicated    Rehab Potential Good    PT Frequency 2x / week    PT Duration 12 weeks    PT Treatment/Interventions ADLs/Self Care Home Management;Cryotherapy;Electrical Stimulation;Moist Heat;Gait training;Stair training;Functional mobility training;Therapeutic activities;Therapeutic exercise;Balance training;Neuromuscular re-education;Patient/family education;Passive range of motion;Manual techniques;Dry needling;Taping;Vasopneumatic Device    PT Next Visit Plan Long duration extension stretching in home/clinic as needed, progression of strengthening in NWB and WB.    PT Home Exercise Plan Access Code: [email or web address]  URL: [email or web address]  Date: 06/18/2021  Prepared by: Kearney Hard    Exercises  Supine Heel Slide - 3-4 x daily - 7 x weekly - 2 sets - 10 reps  Seated Knee Flexion Extension AROM - 3-4 x daily - 7 x weekly - 2 sets - 10 reps  Supine Active Straight Leg Raise - 3-4 x daily - 7 x weekly - 2 sets - 10 reps  Sit to Stand with Counter Support - 3-4 x daily - 7 x weekly - 2 sets - 10 reps  Seated Long Arc Quad with Hip Adduction - 3-4 x daily - 7 x  weekly - 3 sets - 10 reps  Prone Knee Extension Hang - 3 x daily - 7 x weekly - build up to 10 minute holds hold  Supine Quad Set - 3-4 x daily - 7 x weekly - 2 sets - 10 reps - 5 seconds hold    Consulted and Agree with Plan of Care Patient             Patient will benefit from skilled therapeutic intervention in order to improve the following deficits and impairments:  Pain, Decreased mobility, Decreased strength, Increased edema, Decreased balance, Decreased activity tolerance, Difficulty walking, Decreased range of motion, Impaired flexibility, Abnormal gait  Visit Diagnosis: Acute pain of left knee  Stiffness of left knee, not elsewhere classified  Muscle weakness (generalized)  Localized edema  Difficulty in walking, not elsewhere classified     Problem List Patient Active Problem List   Diagnosis Date Noted   Status post left knee replacement 06/01/2021   Status post total left knee replacement 05/31/2021  Unilateral primary osteoarthritis, left knee 12/21/2020   Morbid obesity with body mass index (BMI) of 45.0 to 49.9 in adult ([place]) 07/06/2018   Type 2 diabetes mellitus without complication, without long-term current use of insulin ([place]) 05/22/2017   Class 3 obesity with serious comorbidity and body mass index (BMI) of 40.0 to 44.9 in adult 05/21/2017   Family history of colon cancer 04/01/2017   Irritable bowel syndrome with diarrhea 04/01/2017   NSAID long-term use 04/01/2017   Lymphocytosis 02/24/2017   Essential hypertension 02/12/2017   Mild intermittent asthma with acute exacerbation 12/12/2016   Left shoulder pain 06/02/2015   Insomnia 05/12/2014   Fibromyalgia 05/12/2014   Allergic rhinitis 03/28/2014   Osteoarthritis 12/20/2013   Cervical strain 06/21/2013   Low back pain 06/21/2013   Metatarsalgia of both feet 03/11/2013   Sciatica 01/25/2013   Restless leg syndrome 11/24/2012   Pure hypercholesterolemia 10/20/2012   OSA on CPAP 10/20/2012    Depression 10/20/2012   Iron deficiency anemia, unspecified 10/01/2012   Snoring 10/01/2012   Diabetes mellitus, type 2 ([place]) 12/31/2010   ANAL FISSURE 12/31/2010   Morbid obesity ([place]) 01/08/2007   CONSTIPATION 01/08/2007   Scot Jun, PT, DPT, OCS, ATC 07/18/21  10:51 AM    [facility] [address] [place], [place], [address] Phone: [phone]   Fax:  [phone]  Name: Cristina [name] MRN: [id number] Date of Birth: [birth year]

## 2021-07-19 ENCOUNTER — Telehealth: Payer: Self-pay | Admitting: Orthopaedic Surgery

## 2021-07-19 ENCOUNTER — Encounter: Payer: Self-pay | Admitting: Physician Assistant

## 2021-07-19 ENCOUNTER — Encounter: Payer: Self-pay | Admitting: Physical Therapy

## 2021-07-19 ENCOUNTER — Ambulatory Visit (INDEPENDENT_AMBULATORY_CARE_PROVIDER_SITE_OTHER): Payer: 59 | Admitting: Physical Therapy

## 2021-07-19 ENCOUNTER — Ambulatory Visit (INDEPENDENT_AMBULATORY_CARE_PROVIDER_SITE_OTHER): Payer: 59 | Admitting: Physician Assistant

## 2021-07-19 VITALS — Ht 61.0 in | Wt 178.0 lb

## 2021-07-19 DIAGNOSIS — M25662 Stiffness of left knee, not elsewhere classified: Secondary | ICD-10-CM | POA: Diagnosis not present

## 2021-07-19 DIAGNOSIS — R600 Localized edema: Secondary | ICD-10-CM

## 2021-07-19 DIAGNOSIS — R262 Difficulty in walking, not elsewhere classified: Secondary | ICD-10-CM | POA: Diagnosis not present

## 2021-07-19 DIAGNOSIS — M25562 Pain in left knee: Secondary | ICD-10-CM | POA: Diagnosis not present

## 2021-07-19 DIAGNOSIS — M6281 Muscle weakness (generalized): Secondary | ICD-10-CM | POA: Diagnosis not present

## 2021-07-19 DIAGNOSIS — Z96652 Presence of left artificial knee joint: Secondary | ICD-10-CM

## 2021-07-19 NOTE — Progress Notes (Signed)
HPI: Cristina [name] returns today status post left total knee arthroplasty 05/31/2021.  She overall is doing well.  She states that she only occasionally needs to take muscle relaxant and oxycodone.  She does have occasional spasms in all night.  No fevers chills.  She continues work with physical therapy on range of motion strengthening the knee.  Physical exam: General well-developed well-nourished female no acute distress mood and affect appropriate Left knee she lacks a few degrees in full extension flexes 95 degrees actively.  Calf supple nontender.  Surgical incision is healing well no signs of infection.  No gross instability valgus varus stressing left knee.  Impression: Status post left total knee arthroplasty 05/31/2021  Plan: She will continue to work on range of motion strengthening the knee.  Follow-up with Dr. In 2 weeks see how she is progress with her motion.  Questions were encouraged and answered at length today.  She did asked to return to work on September 12 and note was given releasing her back to work.  She mostly does sedentary job.  I discussed with her at length that she needs to continue to do physical therapy and continue to work on her exercises at home despite going back to work.

## 2021-07-19 NOTE — Telephone Encounter (Signed)
Received medical records release form,$25.00 cash and disability forms from patient    Forwarding to [facility] today

## 2021-07-19 NOTE — Therapy (Signed)
[place], [place], [address] Phone: [phone]   Fax:  [phone]  Physical Therapy Treatment  Patient Details  Name: Cristina [name] MRN: [id number] Date of Birth: 1961/11/04 Referring Provider (PT): Erskine Emery PA-C   Encounter Date: 07/19/2021   PT End of Session - 07/19/21 1338     Visit Number 9    Number of Visits 24    Date for PT Re-Evaluation 09/14/21    Authorization Type UMR    PT Start Time 1259    PT Stop Time 1339    PT Time Calculation (min) 40 min    Activity Tolerance Patient tolerated treatment well    Behavior During Therapy WFL for tasks assessed/performed             Past Medical History:  Diagnosis Date   Allergic rhinitis, cause unspecified    Anemia    Arthritis    Asthma    Back pain    Chest pain    Constipation    CTS (carpal tunnel syndrome)    Cystocele    Depression    Diabetes mellitus    Dysfunction of eustachian tube    Fatty liver    Fibromyalgia    Fissure, anal    Gallbladder problem    Genital herpes    GERD (gastroesophageal reflux disease)    Headache    Hemorrhoid    Hx of migraine headaches    Hyperlipidemia    IBS (irritable bowel syndrome)    Insomnia    Iron deficiency anemia, unspecified    Joint pain    Nausea    Obesity    OSA on CPAP    Osteoarthritis    Rectocele    Sleep apnea    TMJ (dislocation of temporomandibular joint)     Past Surgical History:  Procedure Laterality Date   ABDOMINAL HYSTERECTOMY  11/11/2010   [place] Plan.  Ovaries intact.  Uterine fibroids with DUB.   CARPAL TUNNEL RELEASE  11/12/1987   Left   CHOLECYSTECTOMY     CHOLECYSTECTOMY     COLONOSCOPY  06/29/2012   normal.  Repeat in 5 years.   DILATION AND CURETTAGE OF UTERUS     ECTOPIC PREGNANCY SURGERY  11/12/1991   ESOPHAGOGASTRODUODENOSCOPY  12/13/2011   dysphagia.  Henrene Pastor.  Normal.   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  CPAP titration at 12 cm water pressure.   TOTAL KNEE ARTHROPLASTY Left 05/31/2021   Procedure: LEFT TOTAL KNEE ARTHROPLASTY;  Surgeon: Mcarthur Rossetti, MD;  Location: [facility];  Service: Orthopedics;  Laterality: Left;   TUBAL LIGATION  11/11/1986    There were no vitals filed for this visit.   Subjective Assessment - 07/19/21 1259     Subjective knee is still stiff; sees [place] after this.  wants to go back to work Monday    Limitations Walking;House hold activities    How long can you sit comfortably? Stiffness when getting up.    How long can you stand comfortably? 30 minutes    How long can you walk comfortably? 5 minutes    Diagnostic tests X-ray    Patient Stated Goals Walk without pain, get my leg moving    Currently in Pain? Yes    Pain Score 4  Pain Location Knee    Pain Orientation Left;Medial    Pain Descriptors / Indicators Aching;Sore    Pain Type Surgical pain    Pain Onset More than a month ago    Pain Frequency Intermittent    Aggravating Factors  stiffness with static positioning    Pain Relieving Factors movement helps the stiffness                [facility] PT Assessment - 07/19/21 1321       Assessment   Medical Diagnosis left TKA    Referring Provider (PT) Erskine Emery PA-C    Onset Date/Surgical Date 05/31/21      AROM   Left Knee Extension -12   seated LAQ   Left Knee Flexion 90      PROM   Left Knee Extension -8    Left Knee Flexion 103                           [facility] Adult PT Treatment/Exercise - 07/19/21 1302       Knee/Hip Exercises: Stretches   Passive Hamstring Stretch 60 seconds;3 reps;Left   on shuttle leg press between sets     Knee/Hip Exercises: Aerobic   Recumbent Bike Seat 5, Full revolution 8 mins - no resistance      Knee/Hip  Exercises: Machines for Strengthening   Cybex Leg Press Double leg 81 lbs 3x10, Lt single leg 3x10 37 lbs      Knee/Hip Exercises: Seated   Long Arc Quad Left;3 sets;10 reps    Long Arc Quad Weight 5 lbs.    Other Seated Knee/Hip Exercises seated SLR 2x10                       PT Short Term Goals - 07/11/21 1332       PT SHORT TERM GOAL #1   Title She will be independent with intial HEP.    Status Achieved      PT SHORT TERM GOAL #2   Title Pt will report decr pain and be  able to stand 10 min or more before increased pain    Baseline 30 minutes    Status Achieved               PT Long Term Goals - 07/19/21 1338       PT LONG TERM GOAL #1   Title Pt will be independent in her advanced HEP.    Time 12    Period Weeks    Status On-going      PT LONG TERM GOAL #2   Title She will be able to walk for > 20 min with pain in knee to </= 3/10 with no device.    Time 12    Period Weeks    Status On-going      PT LONG TERM GOAL #3   Title Pt will improve her left knee flexion and extension from -5 to 120 degrees    Time 12    Period Weeks    Status On-going      PT LONG TERM GOAL #4   Title Pt will improve her left knee strength to grossly +4/5.    Time 12    Period Weeks    Status On-going      PT LONG TERM GOAL #5   Title FOTO improved to >/= 58 % function.    Time 12  Period Weeks    Status On-going                   Plan - 07/19/21 1339     Clinical Impression Statement Pt demonstrated improvement in extension with working LLLD stretch at home and has steady improvement in flexion as well.  Still lacking ROM and strength/balance.  Will continue to benefit from PT to maximize function.    Personal Factors and Comorbidities Comorbidity 3+    Comorbidities anemia arthritis, asthma, depression, DM, fibromyalgia, back pain, GERD, HA, hemorrhoid, IBS, hyperlipidemia, Iron deficiency, obesity, sleep apnea,    Examination-Activity  Limitations Stairs;Squat;Sleep;Transfers;Stand;Locomotion Level    Examination-Participation Restrictions Community Activity;Other;Occupation;Cleaning    Stability/Clinical Decision Making Stable/Uncomplicated    Rehab Potential Good    PT Frequency 2x / week    PT Duration 12 weeks    PT Treatment/Interventions ADLs/Self Care Home Management;Cryotherapy;Electrical Stimulation;Moist Heat;Gait training;Stair training;Functional mobility training;Therapeutic activities;Therapeutic exercise;Balance training;Neuromuscular re-education;Patient/family education;Passive range of motion;Manual techniques;Dry needling;Taping;Vasopneumatic Device    PT Next Visit Plan Long duration extension stretching in home/clinic as needed, progression of strengthening in NWB and WB, continue with ROM focus    PT Home Exercise Plan Access Code: [email or web address]    Consulted and Agree with Plan of Care Patient             Patient will benefit from skilled therapeutic intervention in order to improve the following deficits and impairments:  Pain, Decreased mobility, Decreased strength, Increased edema, Decreased balance, Decreased activity tolerance, Difficulty walking, Decreased range of motion, Impaired flexibility, Abnormal gait  Visit Diagnosis: Acute pain of left knee  Stiffness of left knee, not elsewhere classified  Muscle weakness (generalized)  Localized edema  Difficulty in walking, not elsewhere classified     Problem List Patient Active Problem List   Diagnosis Date Noted   Status post left knee replacement 06/01/2021   Status post total left knee replacement 05/31/2021   Unilateral primary osteoarthritis, left knee 12/21/2020   Morbid obesity with body mass index (BMI) of 45.0 to 49.9 in adult ([place]) 07/06/2018   Type 2 diabetes mellitus without complication, without long-term current use of insulin ([place]) 05/22/2017   Class 3 obesity with serious comorbidity and body mass index (BMI) of 40.0 to  44.9 in adult 05/21/2017   Family history of colon cancer 04/01/2017   Irritable bowel syndrome with diarrhea 04/01/2017   NSAID long-term use 04/01/2017   Lymphocytosis 02/24/2017   Essential hypertension 02/12/2017   Mild intermittent asthma with acute exacerbation 12/12/2016   Left shoulder pain 06/02/2015   Insomnia 05/12/2014   Fibromyalgia 05/12/2014   Allergic rhinitis 03/28/2014   Osteoarthritis 12/20/2013   Cervical strain 06/21/2013   Low back pain 06/21/2013   Metatarsalgia of both feet 03/11/2013   Sciatica 01/25/2013   Restless leg syndrome 11/24/2012   Pure hypercholesterolemia 10/20/2012   OSA on CPAP 10/20/2012   Depression 10/20/2012   Iron deficiency anemia, unspecified 10/01/2012   Snoring 10/01/2012   Diabetes mellitus, type 2 ([place]) 12/31/2010   ANAL FISSURE 12/31/2010   Morbid obesity ([place]) 01/08/2007   CONSTIPATION 01/08/2007     Laureen Abrahams, PT, DPT 07/19/21 1:41 PM     [facility] [address] [place], [place], [address] Phone: [phone]   Fax:  [phone]  Name: Cristina [name] MRN: [id number] Date of Birth: October 30, 1961

## 2021-07-23 ENCOUNTER — Encounter: Payer: 59 | Admitting: Physician Assistant

## 2021-07-24 ENCOUNTER — Other Ambulatory Visit: Payer: Self-pay | Admitting: Physician Assistant

## 2021-07-24 ENCOUNTER — Telehealth: Payer: Self-pay

## 2021-07-24 ENCOUNTER — Other Ambulatory Visit (HOSPITAL_COMMUNITY): Payer: Self-pay

## 2021-07-24 MED ORDER — CYCLOBENZAPRINE HCL 10 MG PO TABS
10.0000 mg | ORAL_TABLET | Freq: Three times a day (TID) | ORAL | 0 refills | Status: DC | PRN
  Filled 2021-07-24: qty 30, 10d supply, fill #0

## 2021-07-24 NOTE — Telephone Encounter (Signed)
Please advise 

## 2021-07-24 NOTE — Telephone Encounter (Signed)
Pt portal message sent to pt as well

## 2021-07-24 NOTE — Telephone Encounter (Signed)
Patient called and had states that she is having increased muscle spasms in her leg. I can see where medication was switched this morning to see if this would help. She mentions that she has had a vein that is protruding as well. Patient is s/p L TKA preformed on 05/31/2021  [phone] (home) [phone] (work)

## 2021-07-25 ENCOUNTER — Ambulatory Visit (INDEPENDENT_AMBULATORY_CARE_PROVIDER_SITE_OTHER): Payer: 59 | Admitting: Physician Assistant

## 2021-07-25 ENCOUNTER — Other Ambulatory Visit: Payer: Self-pay

## 2021-07-25 ENCOUNTER — Encounter: Payer: Self-pay | Admitting: Physician Assistant

## 2021-07-25 ENCOUNTER — Telehealth: Payer: Self-pay | Admitting: Orthopaedic Surgery

## 2021-07-25 DIAGNOSIS — Z96652 Presence of left artificial knee joint: Secondary | ICD-10-CM

## 2021-07-25 NOTE — Telephone Encounter (Signed)
Received medical records release form,$25.00 cash and disability paperwork for [employer]/ Forwarding to [facility] today

## 2021-07-25 NOTE — Telephone Encounter (Signed)
Patient came by the office advised [employer] have not received her disability information. Patient asked for a call back as soon as possible. Patient also asked that a copy of the information be mailed to her. The number to contact patient is [phone]

## 2021-07-25 NOTE — Progress Notes (Signed)
HPI: Cristina [name] returns today she comes in due to some swelling that started lateral aspect of her left knee yesterday.  She continues work with physical therapy on range of motion. She has been to 9 therapy visits thus far.  Measurements -12 to 90 degrees flexion active range of motion and passive range of motion left knee -8 to 130 degrees of flexion.   Physical exam: Left knee surgical incisions healing well.  There is no effusion abnormal warmth erythema.  Varicose vein noted lateral aspect of the knee.  Calf supple nontender.  Active she lacks a few degrees in full extension and flexes to 90 degrees.  Passively flex her to approximately 95 degrees.  Impression: 8 weeks status post left total knee arthroplasty  Plan: She will continue work with therapy on range of motion of the knee.  Questions were encouraged and answered at length.  She will follow-up with [name] in 1 month.  At this point time we will hold off on manipulation.  Dr. Ninfa Linden and I discussed with her that she needs to work hard on regaining her flexion.

## 2021-07-26 ENCOUNTER — Other Ambulatory Visit: Payer: Self-pay | Admitting: Physician Assistant

## 2021-07-26 ENCOUNTER — Other Ambulatory Visit (HOSPITAL_COMMUNITY): Payer: Self-pay

## 2021-07-26 MED ORDER — OXYCODONE HCL 5 MG PO TABS
5.0000 mg | ORAL_TABLET | Freq: Four times a day (QID) | ORAL | 0 refills | Status: DC | PRN
  Filled 2021-07-26: qty 30, 4d supply, fill #0

## 2021-07-26 NOTE — Progress Notes (Unsigned)
enr

## 2021-07-26 NOTE — Telephone Encounter (Signed)
Please advise 

## 2021-07-27 ENCOUNTER — Other Ambulatory Visit (HOSPITAL_COMMUNITY): Payer: Self-pay

## 2021-07-27 ENCOUNTER — Ambulatory Visit (INDEPENDENT_AMBULATORY_CARE_PROVIDER_SITE_OTHER): Payer: 59 | Admitting: Physical Therapy

## 2021-07-27 ENCOUNTER — Other Ambulatory Visit: Payer: Self-pay

## 2021-07-27 DIAGNOSIS — M25562 Pain in left knee: Secondary | ICD-10-CM | POA: Diagnosis not present

## 2021-07-27 DIAGNOSIS — M25662 Stiffness of left knee, not elsewhere classified: Secondary | ICD-10-CM

## 2021-07-27 DIAGNOSIS — R600 Localized edema: Secondary | ICD-10-CM

## 2021-07-27 DIAGNOSIS — R262 Difficulty in walking, not elsewhere classified: Secondary | ICD-10-CM | POA: Diagnosis not present

## 2021-07-27 DIAGNOSIS — M6281 Muscle weakness (generalized): Secondary | ICD-10-CM

## 2021-07-27 MED ORDER — CARESTART COVID-19 HOME TEST VI KIT
PACK | 0 refills | Status: DC
  Filled 2021-07-27: qty 4, 4d supply, fill #0

## 2021-07-27 NOTE — Telephone Encounter (Signed)
Please advise 

## 2021-07-27 NOTE — Therapy (Signed)
[place], [place], [address] Phone: [phone]   Fax:  [phone]  Physical Therapy Treatment  Patient Details  Name: Cristina [name] MRN: [id number] Date of Birth: 1961-08-29 Referring Provider (PT): Erskine Emery PA-C   Encounter Date: 07/27/2021   PT End of Session - 07/27/21 0955     Visit Number 10    Number of Visits 24    Date for PT Re-Evaluation 09/14/21    Authorization Type UMR    PT Start Time 0930    PT Stop Time 1010    PT Time Calculation (min) 40 min    Activity Tolerance Patient tolerated treatment well    Behavior During Therapy [facility] for tasks assessed/performed             Past Medical History:  Diagnosis Date   Allergic rhinitis, cause unspecified    Anemia    Arthritis    Asthma    Back pain    Chest pain    Constipation    CTS (carpal tunnel syndrome)    Cystocele    Depression    Diabetes mellitus    Dysfunction of eustachian tube    Fatty liver    Fibromyalgia    Fissure, anal    Gallbladder problem    Genital herpes    GERD (gastroesophageal reflux disease)    Headache    Hemorrhoid    Hx of migraine headaches    Hyperlipidemia    IBS (irritable bowel syndrome)    Insomnia    Iron deficiency anemia, unspecified    Joint pain    Nausea    Obesity    OSA on CPAP    Osteoarthritis    Rectocele    Sleep apnea    TMJ (dislocation of temporomandibular joint)     Past Surgical History:  Procedure Laterality Date   ABDOMINAL HYSTERECTOMY  11/11/2010   [place] Plan.  Ovaries intact.  Uterine fibroids with DUB.   CARPAL TUNNEL RELEASE  11/12/1987   Left   CHOLECYSTECTOMY     CHOLECYSTECTOMY     COLONOSCOPY  06/29/2012   normal.  Repeat in 5 years.   DILATION AND CURETTAGE OF UTERUS     ECTOPIC PREGNANCY SURGERY  11/12/1991   ESOPHAGOGASTRODUODENOSCOPY  12/13/2011   dysphagia.  Henrene Pastor.  Normal.   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  CPAP titration at 12 cm water pressure.   TOTAL KNEE ARTHROPLASTY Left 05/31/2021   Procedure: LEFT TOTAL KNEE ARTHROPLASTY;  Surgeon: Mcarthur Rossetti, MD;  Location: [place];  Service: Orthopedics;  Laterality: Left;   TUBAL LIGATION  11/11/1986    There were no vitals filed for this visit.   Subjective Assessment - 07/27/21 0939     Subjective She relays 4/10 overall pain today in her Lt knee, she says her leg is tired and heavy after being back to work this week.    Limitations Walking;House hold activities    How long can you sit comfortably? Stiffness when getting up.    How long can you stand comfortably? 30 minutes    How long can you walk comfortably? 5 minutes    Diagnostic tests X-ray    Patient Stated Goals Walk without pain, get my leg moving    Pain Onset  More than a month ago              [facility] Adult PT Treatment/Exercise - 07/27/21 0001       Ambulation/Gait   Ambulation/Gait Yes    Ambulation/Gait Assistance 6: Modified independent (Device/Increase time)    Ambulation Distance (Feet) 150 Feet    Gait Comments SPC use, lacking TKE noted on Lt in stance      Knee/Hip Exercises: Stretches   Active Hamstring Stretch Both;2 reps;60 seconds    Active Hamstring Stretch Limitations at leg press    Gastroc Stretch Both;3 reps;30 seconds    Gastroc Stretch Limitations slantboard    Other Knee/Hip Stretches supine heel prop 5 mins for passive extension      Knee/Hip Exercises: Aerobic   Recumbent Bike 8 min      Knee/Hip Exercises: Machines for Strengthening   Cybex Leg Press Double leg 87 lbs 3x10, Lt single leg 3x10 43 lbs      Knee/Hip Exercises: Standing   Heel Raises Limitations 20 reps heel and toe raises    Forward Step Up 2 sets;10 reps;Left;Hand Hold: 1;Step Height: 6"       Knee/Hip Exercises: Seated   Long Arc Quad Left;3 sets;10 reps    Long Arc Quad Weight 5 lbs.    Sit to Sand 10 reps;without UE support;2 sets                       PT Short Term Goals - 07/11/21 1332       PT SHORT TERM GOAL #1   Title She will be independent with intial HEP.    Status Achieved      PT SHORT TERM GOAL #2   Title Pt will report decr pain and be  able to stand 10 min or more before increased pain    Baseline 30 minutes    Status Achieved               PT Long Term Goals - 07/19/21 1338       PT LONG TERM GOAL #1   Title Pt will be independent in her advanced HEP.    Time 12    Period Weeks    Status On-going      PT LONG TERM GOAL #2   Title She will be able to walk for > 20 min with pain in knee to </= 3/10 with no device.    Time 12    Period Weeks    Status On-going      PT LONG TERM GOAL #3   Title Pt will improve her left knee flexion and extension from -5 to 120 degrees    Time 12    Period Weeks    Status On-going      PT LONG TERM GOAL #4   Title Pt will improve her left knee strength to grossly +4/5.    Time 12    Period Weeks    Status On-going      PT LONG TERM GOAL #5   Title FOTO improved to >/= 58 % function.    Time 12    Period Weeks    Status On-going                   Plan - 07/27/21 0959     Clinical Impression Statement We continued to work to improve knee extension ROM as this is her biggest limitation. She did have good tolerance to  strength progresson today. Continue POC    Personal Factors and Comorbidities Comorbidity 3+    Comorbidities anemia arthritis, asthma, depression, DM, fibromyalgia, back pain, GERD, HA, hemorrhoid, IBS, hyperlipidemia, Iron deficiency, obesity, sleep apnea,    Examination-Activity Limitations Stairs;Squat;Sleep;Transfers;Stand;Locomotion Level    Examination-Participation Restrictions Community Activity;Other;Occupation;Cleaning    Stability/Clinical Decision  Making Stable/Uncomplicated    Rehab Potential Good    PT Frequency 2x / week    PT Duration 12 weeks    PT Treatment/Interventions ADLs/Self Care Home Management;Cryotherapy;Electrical Stimulation;Moist Heat;Gait training;Stair training;Functional mobility training;Therapeutic activities;Therapeutic exercise;Balance training;Neuromuscular re-education;Patient/family education;Passive range of motion;Manual techniques;Dry needling;Taping;Vasopneumatic Device    PT Next Visit Plan Long duration extension stretching in home/clinic as needed, progression of strengthening in NWB and WB, continue with ROM focus    PT Home Exercise Plan Access Code: [email or web address]    Consulted and Agree with Plan of Care Patient             Patient will benefit from skilled therapeutic intervention in order to improve the following deficits and impairments:  Pain, Decreased mobility, Decreased strength, Increased edema, Decreased balance, Decreased activity tolerance, Difficulty walking, Decreased range of motion, Impaired flexibility, Abnormal gait  Visit Diagnosis: Acute pain of left knee  Stiffness of left knee, not elsewhere classified  Muscle weakness (generalized)  Localized edema  Difficulty in walking, not elsewhere classified     Problem List Patient Active Problem List   Diagnosis Date Noted   Status post left knee replacement 06/01/2021   Status post total left knee replacement 05/31/2021   Unilateral primary osteoarthritis, left knee 12/21/2020   Morbid obesity with body mass index (BMI) of 45.0 to 49.9 in adult ([place]) 07/06/2018   Type 2 diabetes mellitus without complication, without long-term current use of insulin ([place]) 05/22/2017   Class 3 obesity with serious comorbidity and body mass index (BMI) of 40.0 to 44.9 in adult 05/21/2017   Family history of colon cancer 04/01/2017   Irritable bowel syndrome with diarrhea 04/01/2017   NSAID long-term use 04/01/2017   Lymphocytosis 02/24/2017    Essential hypertension 02/12/2017   Mild intermittent asthma with acute exacerbation 12/12/2016   Left shoulder pain 06/02/2015   Insomnia 05/12/2014   Fibromyalgia 05/12/2014   Allergic rhinitis 03/28/2014   Osteoarthritis 12/20/2013   Cervical strain 06/21/2013   Low back pain 06/21/2013   Metatarsalgia of both feet 03/11/2013   Sciatica 01/25/2013   Restless leg syndrome 11/24/2012   Pure hypercholesterolemia 10/20/2012   OSA on CPAP 10/20/2012   Depression 10/20/2012   Iron deficiency anemia, unspecified 10/01/2012   Snoring 10/01/2012   Diabetes mellitus, type 2 ([place]) 12/31/2010   ANAL FISSURE 12/31/2010   Morbid obesity ([place]) 01/08/2007   CONSTIPATION 01/08/2007    Debbe Odea, PT,DPT 07/27/2021, 10:08 AM  [facility] Physical Therapy [address] [place], [place], [address] Phone: [phone]   Fax:  [phone]  Name: Cristina [name] MRN: [id number] Date of Birth: 02/07/1961

## 2021-07-30 ENCOUNTER — Other Ambulatory Visit: Payer: Self-pay

## 2021-07-30 ENCOUNTER — Ambulatory Visit (INDEPENDENT_AMBULATORY_CARE_PROVIDER_SITE_OTHER): Payer: 59 | Admitting: Physical Therapy

## 2021-07-30 DIAGNOSIS — M6281 Muscle weakness (generalized): Secondary | ICD-10-CM

## 2021-07-30 DIAGNOSIS — M25562 Pain in left knee: Secondary | ICD-10-CM | POA: Diagnosis not present

## 2021-07-30 DIAGNOSIS — R262 Difficulty in walking, not elsewhere classified: Secondary | ICD-10-CM | POA: Diagnosis not present

## 2021-07-30 DIAGNOSIS — R600 Localized edema: Secondary | ICD-10-CM | POA: Diagnosis not present

## 2021-07-30 DIAGNOSIS — M25662 Stiffness of left knee, not elsewhere classified: Secondary | ICD-10-CM

## 2021-07-30 NOTE — Therapy (Signed)
[facility] Physical Therapy [address] [place], [place], [address] Phone: [phone]   Fax:  [phone]  Physical Therapy Treatment  Patient Details  Name: Cristina [name] MRN: [id number] Date of Birth: [birth year] Referring Provider (PT): Erskine Emery PA-C   Encounter Date: 07/30/2021   PT End of Session - 07/30/21 0835     Visit Number 11    Number of Visits 24    Date for PT Re-Evaluation 09/14/21    Authorization Type UMR    PT Start Time 0805    PT Stop Time 0845    PT Time Calculation (min) 40 min    Activity Tolerance Patient tolerated treatment well    Behavior During Therapy [facility] for tasks assessed/performed             Past Medical History:  Diagnosis Date   Allergic rhinitis, cause unspecified    Anemia    Arthritis    Asthma    Back pain    Chest pain    Constipation    CTS (carpal tunnel syndrome)    Cystocele    Depression    Diabetes mellitus    Dysfunction of eustachian tube    Fatty liver    Fibromyalgia    Fissure, anal    Gallbladder problem    Genital herpes    GERD (gastroesophageal reflux disease)    Headache    Hemorrhoid    Hx of migraine headaches    Hyperlipidemia    IBS (irritable bowel syndrome)    Insomnia    Iron deficiency anemia, unspecified    Joint pain    Nausea    Obesity    OSA on CPAP    Osteoarthritis    Rectocele    Sleep apnea    TMJ (dislocation of temporomandibular joint)     Past Surgical History:  Procedure Laterality Date   ABDOMINAL HYSTERECTOMY  11/11/2010   [place] Plan.  Ovaries intact.  Uterine fibroids with DUB.   CARPAL TUNNEL RELEASE  11/12/1987   Left   CHOLECYSTECTOMY     CHOLECYSTECTOMY     COLONOSCOPY  06/29/2012   normal.  Repeat in 5 years.   DILATION AND CURETTAGE OF UTERUS     ECTOPIC PREGNANCY SURGERY  11/12/1991   ESOPHAGOGASTRODUODENOSCOPY  12/13/2011   dysphagia.  Henrene Pastor.  Normal.   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  CPAP titration at 12 cm water pressure.   TOTAL KNEE ARTHROPLASTY Left 05/31/2021   Procedure: LEFT TOTAL KNEE ARTHROPLASTY;  Surgeon: Mcarthur Rossetti, MD;  Location: [place];  Service: Orthopedics;  Laterality: Left;   TUBAL LIGATION  11/11/1986    There were no vitals filed for this visit.   Subjective Assessment - 07/30/21 0809     Subjective She relays 5/10 overall pain today in her Lt knee    Limitations Walking;House hold activities    How long can you sit comfortably? Stiffness when getting up.    How long can you stand comfortably? 30 minutes    How long can you walk comfortably? 5 minutes    Diagnostic tests X-ray    Patient Stated Goals Walk without pain, get my leg moving    Pain Onset More than a month ago  [place] Adult PT Treatment/Exercise - 07/30/21 0001       Ambulation/Gait   Ambulation/Gait Yes    Ambulation/Gait Assistance 6: Modified independent (Device/Increase time)    Ambulation Distance (Feet) 150 Feet    Gait Comments SPC use, lacking TKE noted on Lt in stance      Knee/Hip Exercises: Stretches   Active Hamstring Stretch 3 reps;Left;30 seconds    Active Hamstring Stretch Limitations at leg press    Gastroc Stretch Both;3 reps;30 seconds    Gastroc Stretch Limitations slantboard    Other Knee/Hip Stretches supine heel prop 5 mins with 2# for passive extension      Knee/Hip Exercises: Aerobic   Nustep 10 min L6, seat 8      Knee/Hip Exercises: Machines for Strengthening   Cybex Leg Press Double leg 93 lbs 3x10, Lt single leg 3x10 43 lbs      Knee/Hip Exercises: Standing   Heel Raises Limitations 20 reps heel and toe raises    Forward Step Up 2 sets;10 reps;Left;Hand Hold: 1;Step Height: 6"      Knee/Hip Exercises: Seated   Long Arc Quad Left;3 sets;10 reps     Long Arc Quad Weight 5 lbs.      Manual Therapy   Manual therapy comments Lt knee PROM flexion and extension, extension mobs                       PT Short Term Goals - 07/11/21 1332       PT SHORT TERM GOAL #1   Title She will be independent with intial HEP.    Status Achieved      PT SHORT TERM GOAL #2   Title Pt will report decr pain and be  able to stand 10 min or more before increased pain    Baseline 30 minutes    Status Achieved               PT Long Term Goals - 07/19/21 1338       PT LONG TERM GOAL #1   Title Pt will be independent in her advanced HEP.    Time 12    Period Weeks    Status On-going      PT LONG TERM GOAL #2   Title She will be able to walk for > 20 min with pain in knee to </= 3/10 with no device.    Time 12    Period Weeks    Status On-going      PT LONG TERM GOAL #3   Title Pt will improve her left knee flexion and extension from -5 to 120 degrees    Time 12    Period Weeks    Status On-going      PT LONG TERM GOAL #4   Title Pt will improve her left knee strength to grossly +4/5.    Time 12    Period Weeks    Status On-going      PT LONG TERM GOAL #5   Title FOTO improved to >/= 58 % function.    Time 12    Period Weeks    Status On-going                   Plan - 07/30/21 0843     Clinical Impression Statement Extension ROM was primary focus as this continues to be her biggest deficit. We will continue to work to improve this as tolerated.  Personal Factors and Comorbidities Comorbidity 3+    Comorbidities anemia arthritis, asthma, depression, DM, fibromyalgia, back pain, GERD, HA, hemorrhoid, IBS, hyperlipidemia, Iron deficiency, obesity, sleep apnea,    Examination-Activity Limitations Stairs;Squat;Sleep;Transfers;Stand;Locomotion Level    Examination-Participation Restrictions Community Activity;Other;Occupation;Cleaning    Stability/Clinical Decision Making Stable/Uncomplicated    Rehab  Potential Good    PT Frequency 2x / week    PT Duration 12 weeks    PT Treatment/Interventions ADLs/Self Care Home Management;Cryotherapy;Electrical Stimulation;Moist Heat;Gait training;Stair training;Functional mobility training;Therapeutic activities;Therapeutic exercise;Balance training;Neuromuscular re-education;Patient/family education;Passive range of motion;Manual techniques;Dry needling;Taping;Vasopneumatic Device    PT Next Visit Plan Long duration extension stretching in home/clinic as needed, progression of strengthening in NWB and WB, continue with ROM focus    PT Home Exercise Plan Access Code: [email or web address]    Consulted and Agree with Plan of Care Patient             Patient will benefit from skilled therapeutic intervention in order to improve the following deficits and impairments:  Pain, Decreased mobility, Decreased strength, Increased edema, Decreased balance, Decreased activity tolerance, Difficulty walking, Decreased range of motion, Impaired flexibility, Abnormal gait  Visit Diagnosis: Acute pain of left knee  Stiffness of left knee, not elsewhere classified  Muscle weakness (generalized)  Localized edema  Difficulty in walking, not elsewhere classified     Problem List Patient Active Problem List   Diagnosis Date Noted   Status post left knee replacement 06/01/2021   Status post total left knee replacement 05/31/2021   Unilateral primary osteoarthritis, left knee 12/21/2020   Morbid obesity with body mass index (BMI) of 45.0 to 49.9 in adult ([place]) 07/06/2018   Type 2 diabetes mellitus without complication, without long-term current use of insulin ([place]) 05/22/2017   Class 3 obesity with serious comorbidity and body mass index (BMI) of 40.0 to 44.9 in adult 05/21/2017   Family history of colon cancer 04/01/2017   Irritable bowel syndrome with diarrhea 04/01/2017   NSAID long-term use 04/01/2017   Lymphocytosis 02/24/2017   Essential hypertension 02/12/2017    Mild intermittent asthma with acute exacerbation 12/12/2016   Left shoulder pain 06/02/2015   Insomnia 05/12/2014   Fibromyalgia 05/12/2014   Allergic rhinitis 03/28/2014   Osteoarthritis 12/20/2013   Cervical strain 06/21/2013   Low back pain 06/21/2013   Metatarsalgia of both feet 03/11/2013   Sciatica 01/25/2013   Restless leg syndrome 11/24/2012   Pure hypercholesterolemia 10/20/2012   OSA on CPAP 10/20/2012   Depression 10/20/2012   Iron deficiency anemia, unspecified 10/01/2012   Snoring 10/01/2012   Diabetes mellitus, type 2 ([place]) 12/31/2010   ANAL FISSURE 12/31/2010   Morbid obesity ([place]) 01/08/2007   CONSTIPATION 01/08/2007    Debbe Odea, PT,DPT 07/30/2021, 8:47 AM  [facility] Physical Therapy [address] [place], [place], [address] Phone: [phone]   Fax:  [phone]  Name: LIZZIE AN MRN: [id number] Date of Birth: 1961-06-29

## 2021-07-31 ENCOUNTER — Encounter: Payer: 59 | Admitting: Physical Therapy

## 2021-08-01 ENCOUNTER — Other Ambulatory Visit (HOSPITAL_COMMUNITY): Payer: Self-pay

## 2021-08-02 ENCOUNTER — Encounter: Payer: Self-pay | Admitting: Rehabilitative and Restorative Service Providers"

## 2021-08-02 ENCOUNTER — Ambulatory Visit (INDEPENDENT_AMBULATORY_CARE_PROVIDER_SITE_OTHER): Payer: 59 | Admitting: Rehabilitative and Restorative Service Providers"

## 2021-08-02 ENCOUNTER — Other Ambulatory Visit: Payer: Self-pay

## 2021-08-02 DIAGNOSIS — R262 Difficulty in walking, not elsewhere classified: Secondary | ICD-10-CM

## 2021-08-02 DIAGNOSIS — M25562 Pain in left knee: Secondary | ICD-10-CM

## 2021-08-02 DIAGNOSIS — M6281 Muscle weakness (generalized): Secondary | ICD-10-CM

## 2021-08-02 DIAGNOSIS — M25662 Stiffness of left knee, not elsewhere classified: Secondary | ICD-10-CM | POA: Diagnosis not present

## 2021-08-02 DIAGNOSIS — R600 Localized edema: Secondary | ICD-10-CM | POA: Diagnosis not present

## 2021-08-02 NOTE — Therapy (Signed)
[place], [place], [address] Phone: [phone]   Fax:  [phone]  Physical Therapy Treatment  Patient Details  Name: Cristina [name] MRN: [id number] Date of Birth: 07/10/1961 Referring Provider (PT): Erskine Emery PA-C   Encounter Date: 08/02/2021   PT End of Session - 08/02/21 1532     Visit Number 12    Number of Visits 24    Date for PT Re-Evaluation 09/14/21    Authorization Type UMR    Progress Note Due on Visit 48    PT Start Time 1515    PT Stop Time 1605    PT Time Calculation (min) 50 min    Activity Tolerance Patient tolerated treatment well    Behavior During Therapy [facility] for tasks assessed/performed             Past Medical History:  Diagnosis Date   Allergic rhinitis, cause unspecified    Anemia    Arthritis    Asthma    Back pain    Chest pain    Constipation    CTS (carpal tunnel syndrome)    Cystocele    Depression    Diabetes mellitus    Dysfunction of eustachian tube    Fatty liver    Fibromyalgia    Fissure, anal    Gallbladder problem    Genital herpes    GERD (gastroesophageal reflux disease)    Headache    Hemorrhoid    Hx of migraine headaches    Hyperlipidemia    IBS (irritable bowel syndrome)    Insomnia    Iron deficiency anemia, unspecified    Joint pain    Nausea    Obesity    OSA on CPAP    Osteoarthritis    Rectocele    Sleep apnea    TMJ (dislocation of temporomandibular joint)     Past Surgical History:  Procedure Laterality Date   ABDOMINAL HYSTERECTOMY  11/11/2010   [place] Plan.  Ovaries intact.  Uterine fibroids with DUB.   CARPAL TUNNEL RELEASE  11/12/1987   Left   CHOLECYSTECTOMY     CHOLECYSTECTOMY     COLONOSCOPY  06/29/2012   normal.  Repeat in 5 years.   DILATION AND CURETTAGE OF UTERUS     ECTOPIC PREGNANCY SURGERY  11/12/1991   ESOPHAGOGASTRODUODENOSCOPY  12/13/2011   dysphagia.  Henrene Pastor.  Normal.   GASTRIC ROUX-EN-Y N/A 07/06/2018    Procedure: LAPAROSCOPIC ROUX-EN-Y GASTRIC BYPASS WITH UPPER ENDOSCOPY;  Surgeon: Excell Seltzer, MD;  Location: [facility];  Service: General;  Laterality: N/A;   Sleep study  09/11/2012   severe OSA.  CPAP titration at 12 cm water pressure.   TOTAL KNEE ARTHROPLASTY Left 05/31/2021   Procedure: LEFT TOTAL KNEE ARTHROPLASTY;  Surgeon: Mcarthur Rossetti, MD;  Location: [facility];  Service: Orthopedics;  Laterality: Left;   TUBAL LIGATION  11/11/1986    There were no vitals filed for this visit.   Subjective Assessment - 08/02/21 1518     Subjective Pt. indicated she felt quick pain 7/10 or so at times in anterior/medial Lt knee since she stepped weird on it which created that same pain.  Stiffness otherwise noted.    Limitations Walking;House hold activities    How long can you sit comfortably? Stiffness when getting up.    How long can you stand comfortably? 30 minutes    How long can you walk comfortably? 5 minutes    Diagnostic tests X-ray    Patient  Stated Goals Walk without pain, get my leg moving    Currently in Pain? Yes    Pain Score 7    at times   Pain Location Knee    Pain Orientation Left;Medial    Pain Descriptors / Indicators Aching;Sharp;Sore;Tightness    Pain Type Surgical pain    Pain Onset More than a month ago    Pain Frequency Intermittent    Aggravating Factors  stepping wrong, extension movements at times.    Pain Relieving Factors resting leg after quick pains.                [facility] PT Assessment - 08/02/21 0001       Assessment   Medical Diagnosis left TKA    Referring Provider (PT) Erskine Emery PA-C    Onset Date/Surgical Date 05/31/21    Hand Dominance Right      AROM   Left Knee Extension -20   in LAQ   Left Knee Flexion 100   in sitting     PROM   Left Knee Extension -22   in supine heel prop c over pressure light     Strength   Left Knee Flexion 4/5    Left Knee Extension 4/5      Ambulation/Gait   Gait Comments SPC use, noted  lacking full extension on Lt                           [facility] Adult PT Treatment/Exercise - 08/02/21 0001       Knee/Hip Exercises: Stretches   Passive Hamstring Stretch 30 seconds;Left   on shuttle leg press   Gastroc Stretch 30 seconds;Both;5 reps   incline board   Other Knee/Hip Stretches supine heel prop 5 mins (no weight due to irritation levle today)      Knee/Hip Exercises: Aerobic   Recumbent Bike Lvl 2 7 mins seat 4      Knee/Hip Exercises: Machines for Strengthening   Cybex Leg Press held due to pain      Knee/Hip Exercises: Seated   Long Arc Quad Left   3 x 10 0 lbs (due to irritation)   Other Seated Knee/Hip Exercises SLR 2 x 10 Lt      Knee/Hip Exercises: Supine   Other Supine Knee/Hip Exercises supine LAQ in 90 deg flexion x 10      Vasopneumatic   Number Minutes Vasopneumatic  10 minutes    Vasopnuematic Location  Knee    Vasopneumatic Pressure Medium    Vasopneumatic Temperature  34      Manual Therapy   Manual therapy comments seated Lt knee flexion mobilization c movement IR/distraction                       PT Short Term Goals - 07/11/21 1332       PT SHORT TERM GOAL #1   Title She will be independent with intial HEP.    Status Achieved      PT SHORT TERM GOAL #2   Title Pt will report decr pain and be  able to stand 10 min or more before increased pain    Baseline 30 minutes    Status Achieved               PT Long Term Goals - 07/19/21 1338       PT LONG TERM GOAL #1   Title Pt will be independent in  her advanced HEP.    Time 12    Period Weeks    Status On-going      PT LONG TERM GOAL #2   Title She will be able to walk for > 20 min with pain in knee to </= 3/10 with no device.    Time 12    Period Weeks    Status On-going      PT LONG TERM GOAL #3   Title Pt will improve her left knee flexion and extension from -5 to 120 degrees    Time 12    Period Weeks    Status On-going      PT LONG TERM  GOAL #4   Title Pt will improve her left knee strength to grossly +4/5.    Time 12    Period Weeks    Status On-going      PT LONG TERM GOAL #5   Title FOTO improved to >/= 58 % function.    Time 12    Period Weeks    Status On-going                   Plan - 08/02/21 1544     Clinical Impression Statement Pt. had exacerbation of pain in medial aspect of knee, near medial hamstring tendon/insertion/medial joint line.  Tenderness noted in area and observed in WB and NWB positioning.  Mild complaints noted c hamstring activation today but also noted in passive movement at times.  Reduced effort in clinic today c focus on mobility gains due to arriving with more restriction of movement compared to previous (primarily for extension).  No infection signs noted.  Recheck next visit.    Personal Factors and Comorbidities Comorbidity 3+    Comorbidities anemia arthritis, asthma, depression, DM, fibromyalgia, back pain, GERD, HA, hemorrhoid, IBS, hyperlipidemia, Iron deficiency, obesity, sleep apnea,    Examination-Activity Limitations Stairs;Squat;Sleep;Transfers;Stand;Locomotion Level    Examination-Participation Restrictions Community Activity;Other;Occupation;Cleaning    Stability/Clinical Decision Making Stable/Uncomplicated    Rehab Potential Good    PT Frequency 2x / week    PT Duration 12 weeks    PT Treatment/Interventions ADLs/Self Care Home Management;Cryotherapy;Electrical Stimulation;Moist Heat;Gait training;Stair training;Functional mobility training;Therapeutic activities;Therapeutic exercise;Balance training;Neuromuscular re-education;Patient/family education;Passive range of motion;Manual techniques;Dry needling;Taping;Vasopneumatic Device    PT Next Visit Plan Recheck symptoms from medial knee.  Continue to emphasize extension active and passively (long duration stretching).    PT Home Exercise Plan Access Code: [email or web address]    Consulted and Agree with Plan of Care Patient              Patient will benefit from skilled therapeutic intervention in order to improve the following deficits and impairments:  Pain, Decreased mobility, Decreased strength, Increased edema, Decreased balance, Decreased activity tolerance, Difficulty walking, Decreased range of motion, Impaired flexibility, Abnormal gait  Visit Diagnosis: Acute pain of left knee  Stiffness of left knee, not elsewhere classified  Muscle weakness (generalized)  Localized edema  Difficulty in walking, not elsewhere classified     Problem List Patient Active Problem List   Diagnosis Date Noted   Status post left knee replacement 06/01/2021   Status post total left knee replacement 05/31/2021   Unilateral primary osteoarthritis, left knee 12/21/2020   Morbid obesity with body mass index (BMI) of 45.0 to 49.9 in adult [facility]) 07/06/2018   Type 2 diabetes mellitus without complication, without long-term current use of insulin ([place]) 05/22/2017   Class 3 obesity with serious comorbidity and body mass index (BMI)  of 40.0 to 44.9 in adult 05/21/2017   Family history of colon cancer 04/01/2017   Irritable bowel syndrome with diarrhea 04/01/2017   NSAID long-term use 04/01/2017   Lymphocytosis 02/24/2017   Essential hypertension 02/12/2017   Mild intermittent asthma with acute exacerbation 12/12/2016   Left shoulder pain 06/02/2015   Insomnia 05/12/2014   Fibromyalgia 05/12/2014   Allergic rhinitis 03/28/2014   Osteoarthritis 12/20/2013   Cervical strain 06/21/2013   Low back pain 06/21/2013   Metatarsalgia of both feet 03/11/2013   Sciatica 01/25/2013   Restless leg syndrome 11/24/2012   Pure hypercholesterolemia 10/20/2012   OSA on CPAP 10/20/2012   Depression 10/20/2012   Iron deficiency anemia, unspecified 10/01/2012   Snoring 10/01/2012   Diabetes mellitus, type 2 ([place]) 12/31/2010   ANAL FISSURE 12/31/2010   Morbid obesity ([place]) 01/08/2007   CONSTIPATION 01/08/2007    Scot Jun, PT, DPT, OCS, ATC 08/02/21  4:28 PM    [facility] [address] [place], [place], [address] Phone: [phone]   Fax:  [phone]  Name: Cristina [name] MRN: [id number] Date of Birth: June 26, 1961

## 2021-08-03 ENCOUNTER — Other Ambulatory Visit (HOSPITAL_COMMUNITY): Payer: Self-pay

## 2021-08-06 ENCOUNTER — Ambulatory Visit: Payer: 59 | Admitting: Orthopaedic Surgery

## 2021-08-07 ENCOUNTER — Encounter: Payer: Self-pay | Admitting: Rehabilitative and Restorative Service Providers"

## 2021-08-07 ENCOUNTER — Other Ambulatory Visit: Payer: Self-pay

## 2021-08-07 ENCOUNTER — Ambulatory Visit (INDEPENDENT_AMBULATORY_CARE_PROVIDER_SITE_OTHER): Payer: 59 | Admitting: Rehabilitative and Restorative Service Providers"

## 2021-08-07 DIAGNOSIS — M6281 Muscle weakness (generalized): Secondary | ICD-10-CM

## 2021-08-07 DIAGNOSIS — M25562 Pain in left knee: Secondary | ICD-10-CM | POA: Diagnosis not present

## 2021-08-07 DIAGNOSIS — M25662 Stiffness of left knee, not elsewhere classified: Secondary | ICD-10-CM

## 2021-08-07 DIAGNOSIS — R262 Difficulty in walking, not elsewhere classified: Secondary | ICD-10-CM

## 2021-08-07 DIAGNOSIS — R600 Localized edema: Secondary | ICD-10-CM | POA: Diagnosis not present

## 2021-08-07 NOTE — Therapy (Signed)
[place], [place], [address] Phone: [phone]   Fax:  [phone]  Physical Therapy Treatment  Patient Details  Name: MARCENE LASKOWSKI MRN: [id number] Date of Birth: 14-Oct-1961 Referring Provider (PT): Erskine Emery PA-C   Encounter Date: 08/07/2021   PT End of Session - 08/07/21 1600     Visit Number 13    Number of Visits 24    Date for PT Re-Evaluation 09/14/21    Authorization Type UMR    Progress Note Due on Visit 76    PT Start Time 1551    PT Stop Time 1630    PT Time Calculation (min) 39 min    Activity Tolerance Patient tolerated treatment well    Behavior During Therapy [facility] for tasks assessed/performed             Past Medical History:  Diagnosis Date   Allergic rhinitis, cause unspecified    Anemia    Arthritis    Asthma    Back pain    Chest pain    Constipation    CTS (carpal tunnel syndrome)    Cystocele    Depression    Diabetes mellitus    Dysfunction of eustachian tube    Fatty liver    Fibromyalgia    Fissure, anal    Gallbladder problem    Genital herpes    GERD (gastroesophageal reflux disease)    Headache    Hemorrhoid    Hx of migraine headaches    Hyperlipidemia    IBS (irritable bowel syndrome)    Insomnia    Iron deficiency anemia, unspecified    Joint pain    Nausea    Obesity    OSA on CPAP    Osteoarthritis    Rectocele    Sleep apnea    TMJ (dislocation of temporomandibular joint)     Past Surgical History:  Procedure Laterality Date   ABDOMINAL HYSTERECTOMY  11/11/2010   [place] Plan.  Ovaries intact.  Uterine fibroids with DUB.   CARPAL TUNNEL RELEASE  11/12/1987   Left   CHOLECYSTECTOMY     CHOLECYSTECTOMY     COLONOSCOPY  06/29/2012   normal.  Repeat in 5 years.   DILATION AND CURETTAGE OF UTERUS     ECTOPIC PREGNANCY SURGERY  11/12/1991   ESOPHAGOGASTRODUODENOSCOPY  12/13/2011   dysphagia.  Henrene Pastor.  Normal.   GASTRIC ROUX-EN-Y N/A 07/06/2018    Procedure: LAPAROSCOPIC ROUX-EN-Y GASTRIC BYPASS WITH UPPER ENDOSCOPY;  Surgeon: Excell Seltzer, MD;  Location: [facility];  Service: General;  Laterality: N/A;   Sleep study  09/11/2012   severe OSA.  CPAP titration at 12 cm water pressure.   TOTAL KNEE ARTHROPLASTY Left 05/31/2021   Procedure: LEFT TOTAL KNEE ARTHROPLASTY;  Surgeon: Mcarthur Rossetti, MD;  Location: [place];  Service: Orthopedics;  Laterality: Left;   TUBAL LIGATION  11/11/1986    There were no vitals filed for this visit.   Subjective Assessment - 08/07/21 1600     Subjective Pt. stated she felt like she was doing better today and didn't report pain upon arrival.    Limitations Walking;House hold activities    Diagnostic tests X-ray    Patient Stated Goals Walk without pain, get my leg moving    Currently in Pain? No/denies    Pain Score 0-No pain    Pain Onset More than a month ago                [facility]  PT Assessment - 08/07/21 0001       Assessment   Medical Diagnosis left TKA    Referring Provider (PT) Erskine Emery PA-C    Onset Date/Surgical Date 05/31/21    Hand Dominance Right      Ambulation/Gait   Gait Comments Able to ambulate independently in clinic c occasional instances of lateral sway requiring stepping to correct                           [facility] Adult PT Treatment/Exercise - 08/07/21 0001       Neuro Re-ed    Neuro Re-ed Details  tandem stance 1 min x 1 bilateral, tandem ambulation on foam 8 ft x 5 fwd/back each way, retro ambulation 10 ft x 5      Knee/Hip Exercises: Stretches   Passive Hamstring Stretch 30 seconds;3 reps;Left      Knee/Hip Exercises: Machines for Strengthening   Cybex Knee Extension Eccentric Lt 2 x 10 5 lbs    Cybex Leg Press Double leg 93 lbs 3 x 10, Lt single leg 43 lbs      Knee/Hip Exercises: Standing   Lateral Step Up Step Height: 4";Left;2 sets;10 reps   eccentric step down   Other Standing Knee Exercises ascending/descending half  flight of stairs c rail on Lt and then Rt each way c reciprocal gait pattern                       PT Short Term Goals - 07/11/21 1332       PT SHORT TERM GOAL #1   Title She will be independent with intial HEP.    Status Achieved      PT SHORT TERM GOAL #2   Title Pt will report decr pain and be  able to stand 10 min or more before increased pain    Baseline 30 minutes    Status Achieved               PT Long Term Goals - 07/19/21 1338       PT LONG TERM GOAL #1   Title Pt will be independent in her advanced HEP.    Time 12    Period Weeks    Status On-going      PT LONG TERM GOAL #2   Title She will be able to walk for > 20 min with pain in knee to </= 3/10 with no device.    Time 12    Period Weeks    Status On-going      PT LONG TERM GOAL #3   Title Pt will improve her left knee flexion and extension from -5 to 120 degrees    Time 12    Period Weeks    Status On-going      PT LONG TERM GOAL #4   Title Pt will improve her left knee strength to grossly +4/5.    Time 12    Period Weeks    Status On-going      PT LONG TERM GOAL #5   Title FOTO improved to >/= 58 % function.    Time 12    Period Weeks    Status On-going                   Plan - 08/07/21 1629     Clinical Impression Statement Pt. demonstrated much improved WB acceptance today.  Transitioning today to  encourage independent ambulation in clinic and recommended for level surfaces as Pt. prefers.  Continued strengthening in loading and WB to improve functional mobility.    Personal Factors and Comorbidities Comorbidity 3+    Comorbidities anemia arthritis, asthma, depression, DM, fibromyalgia, back pain, GERD, HA, hemorrhoid, IBS, hyperlipidemia, Iron deficiency, obesity, sleep apnea,    Examination-Activity Limitations Stairs;Squat;Sleep;Transfers;Stand;Locomotion Level    Examination-Participation Restrictions Community Activity;Other;Occupation;Cleaning     Stability/Clinical Decision Making Stable/Uncomplicated    Rehab Potential Good    PT Frequency 2x / week    PT Duration 12 weeks    PT Treatment/Interventions ADLs/Self Care Home Management;Cryotherapy;Electrical Stimulation;Moist Heat;Gait training;Stair training;Functional mobility training;Therapeutic activities;Therapeutic exercise;Balance training;Neuromuscular re-education;Patient/family education;Passive range of motion;Manual techniques;Dry needling;Taping;Vasopneumatic Device    PT Next Visit Plan Continue to emphasize extension active and passively (long duration stretching) c eccentric strengthening.    PT Home Exercise Plan Access Code: [email or web address]    Consulted and Agree with Plan of Care Patient             Patient will benefit from skilled therapeutic intervention in order to improve the following deficits and impairments:  Pain, Decreased mobility, Decreased strength, Increased edema, Decreased balance, Decreased activity tolerance, Difficulty walking, Decreased range of motion, Impaired flexibility, Abnormal gait  Visit Diagnosis: Acute pain of left knee  Stiffness of left knee, not elsewhere classified  Muscle weakness (generalized)  Localized edema  Difficulty in walking, not elsewhere classified     Problem List Patient Active Problem List   Diagnosis Date Noted   Status post left knee replacement 06/01/2021   Status post total left knee replacement 05/31/2021   Unilateral primary osteoarthritis, left knee 12/21/2020   Morbid obesity with body mass index (BMI) of 45.0 to 49.9 in adult ([place]) 07/06/2018   Type 2 diabetes mellitus without complication, without long-term current use of insulin ([place]) 05/22/2017   Class 3 obesity with serious comorbidity and body mass index (BMI) of 40.0 to 44.9 in adult 05/21/2017   Family history of colon cancer 04/01/2017   Irritable bowel syndrome with diarrhea 04/01/2017   NSAID long-term use 04/01/2017   Lymphocytosis  02/24/2017   Essential hypertension 02/12/2017   Mild intermittent asthma with acute exacerbation 12/12/2016   Left shoulder pain 06/02/2015   Insomnia 05/12/2014   Fibromyalgia 05/12/2014   Allergic rhinitis 03/28/2014   Osteoarthritis 12/20/2013   Cervical strain 06/21/2013   Low back pain 06/21/2013   Metatarsalgia of both feet 03/11/2013   Sciatica 01/25/2013   Restless leg syndrome 11/24/2012   Pure hypercholesterolemia 10/20/2012   OSA on CPAP 10/20/2012   Depression 10/20/2012   Iron deficiency anemia, unspecified 10/01/2012   Snoring 10/01/2012   Diabetes mellitus, type 2 ([place]) 12/31/2010   ANAL FISSURE 12/31/2010   Morbid obesity ([place]) 01/08/2007   CONSTIPATION 01/08/2007   Scot Jun, PT, DPT, OCS, ATC 08/07/21  4:32 PM    [facility] [address] [place], [place], [address] Phone: [phone]   Fax:  [phone]  Name: BERNADINE MELECIO MRN: [id number] Date of Birth: 07-13-1961

## 2021-08-09 ENCOUNTER — Other Ambulatory Visit: Payer: Self-pay

## 2021-08-09 ENCOUNTER — Ambulatory Visit (INDEPENDENT_AMBULATORY_CARE_PROVIDER_SITE_OTHER): Payer: 59 | Admitting: Rehabilitative and Restorative Service Providers"

## 2021-08-09 ENCOUNTER — Encounter: Payer: Self-pay | Admitting: Rehabilitative and Restorative Service Providers"

## 2021-08-09 DIAGNOSIS — M25662 Stiffness of left knee, not elsewhere classified: Secondary | ICD-10-CM | POA: Diagnosis not present

## 2021-08-09 DIAGNOSIS — M6281 Muscle weakness (generalized): Secondary | ICD-10-CM | POA: Diagnosis not present

## 2021-08-09 DIAGNOSIS — R600 Localized edema: Secondary | ICD-10-CM

## 2021-08-09 DIAGNOSIS — M25562 Pain in left knee: Secondary | ICD-10-CM

## 2021-08-09 DIAGNOSIS — R262 Difficulty in walking, not elsewhere classified: Secondary | ICD-10-CM | POA: Diagnosis not present

## 2021-08-09 NOTE — Therapy (Signed)
[place], [place], [address] Phone: [phone]   Fax:  [phone]  Physical Therapy Treatment  Patient Details  Name: Cristina [name] MRN: [id number] Date of Birth: Jan 11, 1961 Referring Provider (PT): Erskine Emery PA-C   Encounter Date: 08/09/2021   PT End of Session - 08/09/21 1557     Visit Number 14    Number of Visits 24    Date for PT Re-Evaluation 09/14/21    Authorization Type UMR    Progress Note Due on Visit 35    PT Start Time 1554    PT Stop Time 1633    PT Time Calculation (min) 39 min    Activity Tolerance Patient tolerated treatment well    Behavior During Therapy [facility] for tasks assessed/performed             Past Medical History:  Diagnosis Date   Allergic rhinitis, cause unspecified    Anemia    Arthritis    Asthma    Back pain    Chest pain    Constipation    CTS (carpal tunnel syndrome)    Cystocele    Depression    Diabetes mellitus    Dysfunction of eustachian tube    Fatty liver    Fibromyalgia    Fissure, anal    Gallbladder problem    Genital herpes    GERD (gastroesophageal reflux disease)    Headache    Hemorrhoid    Hx of migraine headaches    Hyperlipidemia    IBS (irritable bowel syndrome)    Insomnia    Iron deficiency anemia, unspecified    Joint pain    Nausea    Obesity    OSA on CPAP    Osteoarthritis    Rectocele    Sleep apnea    TMJ (dislocation of temporomandibular joint)     Past Surgical History:  Procedure Laterality Date   ABDOMINAL HYSTERECTOMY  11/11/2010   [place] Plan.  Ovaries intact.  Uterine fibroids with DUB.   CARPAL TUNNEL RELEASE  11/12/1987   Left   CHOLECYSTECTOMY     CHOLECYSTECTOMY     COLONOSCOPY  06/29/2012   normal.  Repeat in 5 years.   DILATION AND CURETTAGE OF UTERUS     ECTOPIC PREGNANCY SURGERY  11/12/1991   ESOPHAGOGASTRODUODENOSCOPY  12/13/2011   dysphagia.  Henrene Pastor.  Normal.   GASTRIC ROUX-EN-Y N/A 07/06/2018    Procedure: LAPAROSCOPIC ROUX-EN-Y GASTRIC BYPASS WITH UPPER ENDOSCOPY;  Surgeon: Excell Seltzer, MD;  Location: [facility];  Service: General;  Laterality: N/A;   Sleep study  09/11/2012   severe OSA.  CPAP titration at 12 cm water pressure.   TOTAL KNEE ARTHROPLASTY Left 05/31/2021   Procedure: LEFT TOTAL KNEE ARTHROPLASTY;  Surgeon: Mcarthur Rossetti, MD;  Location: [place];  Service: Orthopedics;  Laterality: Left;   TUBAL LIGATION  11/11/1986    There were no vitals filed for this visit.   Subjective Assessment - 08/09/21 1556     Subjective Pt. indicated no pain upon arrival today.  Pt. stated no reaction to last visit (no fatigue or pain increase).    Limitations Walking;House hold activities    Diagnostic tests X-ray    Patient Stated Goals Walk without pain, get my leg moving    Currently in Pain? No/denies    Pain Score 0-No pain    Pain Onset More than a month ago  [facility] PT Assessment - 08/09/21 0001       Assessment   Medical Diagnosis left TKA    Referring Provider (PT) Erskine Emery PA-C    Onset Date/Surgical Date 05/31/21    Hand Dominance Right      AROM   Left Knee Flexion 109   in supine heel slide     PROM   Left Knee Extension -10   supine heel prop c overpressure                          [facility] Adult PT Treatment/Exercise - 08/09/21 0001       Neuro Re-ed    Neuro Re-ed Details  tandem stance on foam 1 min x 1 bilateral, wobble board fwd/back 30 x each way      Knee/Hip Exercises: Stretches   Gastroc Stretch 60 seconds;3 reps;Left   incilne board runner stretch   Other Knee/Hip Stretches supine heel prop 5 mins      Knee/Hip Exercises: Aerobic   Recumbent Bike Lvl 3 seat 5, 5 mins      Knee/Hip Exercises: Machines for Strengthening   Cybex Knee Extension Eccentric lowering Lt only 2 x 10 10 lbs    Cybex Leg Press Double leg 2 x 15 100 lbs, Lt 2 x 10 43 lbs                       PT Short  Term Goals - 07/11/21 1332       PT SHORT TERM GOAL #1   Title She will be independent with intial HEP.    Status Achieved      PT SHORT TERM GOAL #2   Title Pt will report decr pain and be  able to stand 10 min or more before increased pain    Baseline 30 minutes    Status Achieved               PT Long Term Goals - 08/09/21 1624       PT LONG TERM GOAL #1   Title Pt will be independent in her advanced HEP.    Time 12    Period Weeks    Status On-going    Target Date 09/14/21      PT LONG TERM GOAL #2   Title She will be able to walk for > 20 min with pain in knee to </= 3/10 with no device.    Time 12    Period Weeks    Status On-going    Target Date 09/14/21      PT LONG TERM GOAL #3   Title Pt will improve her left knee flexion and extension from -5 to 120 degrees    Time 12    Period Weeks    Status On-going    Target Date 09/14/21      PT LONG TERM GOAL #4   Title Pt will improve her left knee strength to grossly +4/5.    Time 12    Period Weeks    Status On-going    Target Date 09/14/21      PT LONG TERM GOAL #5   Title FOTO improved to >/= 58 % function.    Time 12    Period Weeks    Status On-going    Target Date 09/14/21                   Plan -  08/09/21 1613     Clinical Impression Statement Continued positive reduction in overall symptom presentation.  Pt. demonstrated improved control in balance intervention, allowing progression to include compliant foam surface.  Continued progression in strengthening and balance to decrease use of SPC indicated.    Personal Factors and Comorbidities Comorbidity 3+    Comorbidities anemia arthritis, asthma, depression, DM, fibromyalgia, back pain, GERD, HA, hemorrhoid, IBS, hyperlipidemia, Iron deficiency, obesity, sleep apnea,    Examination-Activity Limitations Stairs;Squat;Sleep;Transfers;Stand;Locomotion Level    Examination-Participation Restrictions Community  Activity;Other;Occupation;Cleaning    Stability/Clinical Decision Making Stable/Uncomplicated    Rehab Potential Good    PT Frequency 2x / week    PT Duration 12 weeks    PT Treatment/Interventions ADLs/Self Care Home Management;Cryotherapy;Electrical Stimulation;Moist Heat;Gait training;Stair training;Functional mobility training;Therapeutic activities;Therapeutic exercise;Balance training;Neuromuscular re-education;Patient/family education;Passive range of motion;Manual techniques;Dry needling;Taping;Vasopneumatic Device    PT Next Visit Plan Eccentric strengthening, compliant surface balance, stairs.    PT Home Exercise Plan Access Code: [email or web address]    Consulted and Agree with Plan of Care Patient             Patient will benefit from skilled therapeutic intervention in order to improve the following deficits and impairments:  Pain, Decreased mobility, Decreased strength, Increased edema, Decreased balance, Decreased activity tolerance, Difficulty walking, Decreased range of motion, Impaired flexibility, Abnormal gait  Visit Diagnosis: Acute pain of left knee  Stiffness of left knee, not elsewhere classified  Muscle weakness (generalized)  Localized edema  Difficulty in walking, not elsewhere classified     Problem List Patient Active Problem List   Diagnosis Date Noted   Status post left knee replacement 06/01/2021   Status post total left knee replacement 05/31/2021   Unilateral primary osteoarthritis, left knee 12/21/2020   Morbid obesity with body mass index (BMI) of 45.0 to 49.9 in adult ([place]) 07/06/2018   Type 2 diabetes mellitus without complication, without long-term current use of insulin ([place]) 05/22/2017   Class 3 obesity with serious comorbidity and body mass index (BMI) of 40.0 to 44.9 in adult 05/21/2017   Family history of colon cancer 04/01/2017   Irritable bowel syndrome with diarrhea 04/01/2017   NSAID long-term use 04/01/2017   Lymphocytosis 02/24/2017    Essential hypertension 02/12/2017   Mild intermittent asthma with acute exacerbation 12/12/2016   Left shoulder pain 06/02/2015   Insomnia 05/12/2014   Fibromyalgia 05/12/2014   Allergic rhinitis 03/28/2014   Osteoarthritis 12/20/2013   Cervical strain 06/21/2013   Low back pain 06/21/2013   Metatarsalgia of both feet 03/11/2013   Sciatica 01/25/2013   Restless leg syndrome 11/24/2012   Pure hypercholesterolemia 10/20/2012   OSA on CPAP 10/20/2012   Depression 10/20/2012   Iron deficiency anemia, unspecified 10/01/2012   Snoring 10/01/2012   Diabetes mellitus, type 2 ([place]) 12/31/2010   ANAL FISSURE 12/31/2010   Morbid obesity ([place]) 01/08/2007   CONSTIPATION 01/08/2007   Scot Jun, PT, DPT, OCS, ATC 08/09/21  4:29 PM    [facility] [address] [place], [place], [address] Phone: [phone]   Fax:  [phone]  Name: DEANDA RUDDELL MRN: [id number] Date of Birth: [birth year]

## 2021-08-14 ENCOUNTER — Encounter: Payer: 59 | Admitting: Rehabilitative and Restorative Service Providers"

## 2021-08-15 ENCOUNTER — Ambulatory Visit (INDEPENDENT_AMBULATORY_CARE_PROVIDER_SITE_OTHER): Payer: 59 | Admitting: Orthopaedic Surgery

## 2021-08-15 ENCOUNTER — Ambulatory Visit: Payer: Self-pay

## 2021-08-15 ENCOUNTER — Other Ambulatory Visit: Payer: Self-pay

## 2021-08-15 DIAGNOSIS — Z96652 Presence of left artificial knee joint: Secondary | ICD-10-CM

## 2021-08-15 NOTE — Progress Notes (Signed)
The patient is now 2 and half months status post a left total knee arthroplasty.  She does report still significant swelling but is being increasing her motion and strength of that knee.  She actually returned to work just a month after surgery.  On exam she lacks full extension by about 3 to 5 degrees.  I can flex her to well past 100 degrees.  The knee feels stable.  There is moderate swelling.  She will continue increase her activities as comfort allows.  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  I would like to see her back in 3 months with a repeat AP and lateral of the left knee.

## 2021-08-16 ENCOUNTER — Encounter: Payer: Self-pay | Admitting: Rehabilitative and Restorative Service Providers"

## 2021-08-16 ENCOUNTER — Ambulatory Visit (INDEPENDENT_AMBULATORY_CARE_PROVIDER_SITE_OTHER): Payer: 59 | Admitting: Rehabilitative and Restorative Service Providers"

## 2021-08-16 DIAGNOSIS — R600 Localized edema: Secondary | ICD-10-CM | POA: Diagnosis not present

## 2021-08-16 DIAGNOSIS — M25662 Stiffness of left knee, not elsewhere classified: Secondary | ICD-10-CM | POA: Diagnosis not present

## 2021-08-16 DIAGNOSIS — M6281 Muscle weakness (generalized): Secondary | ICD-10-CM | POA: Diagnosis not present

## 2021-08-16 DIAGNOSIS — M25562 Pain in left knee: Secondary | ICD-10-CM | POA: Diagnosis not present

## 2021-08-16 DIAGNOSIS — R262 Difficulty in walking, not elsewhere classified: Secondary | ICD-10-CM | POA: Diagnosis not present

## 2021-08-16 NOTE — Therapy (Signed)
[place], [place], [address] Phone: [phone]   Fax:  [phone]  Physical Therapy Treatment  Patient Details  Name: Cristina [name] MRN: [id number] Date of Birth: 01/30/1961 Referring Provider (PT): Erskine Emery PA-C   Encounter Date: 08/16/2021   PT End of Session - 08/16/21 1552     Visit Number 15    Number of Visits 24    Date for PT Re-Evaluation 09/14/21    Authorization Type UMR    Progress Note Due on Visit 95    PT Start Time 1552    PT Stop Time 0867    PT Time Calculation (min) 39 min    Activity Tolerance Patient tolerated treatment well    Behavior During Therapy [facility] for tasks assessed/performed             Past Medical History:  Diagnosis Date   Allergic rhinitis, cause unspecified    Anemia    Arthritis    Asthma    Back pain    Chest pain    Constipation    CTS (carpal tunnel syndrome)    Cystocele    Depression    Diabetes mellitus    Dysfunction of eustachian tube    Fatty liver    Fibromyalgia    Fissure, anal    Gallbladder problem    Genital herpes    GERD (gastroesophageal reflux disease)    Headache    Hemorrhoid    Hx of migraine headaches    Hyperlipidemia    IBS (irritable bowel syndrome)    Insomnia    Iron deficiency anemia, unspecified    Joint pain    Nausea    Obesity    OSA on CPAP    Osteoarthritis    Rectocele    Sleep apnea    TMJ (dislocation of temporomandibular joint)     Past Surgical History:  Procedure Laterality Date   ABDOMINAL HYSTERECTOMY  11/11/2010   [place] Plan.  Ovaries intact.  Uterine fibroids with DUB.   CARPAL TUNNEL RELEASE  11/12/1987   Left   CHOLECYSTECTOMY     CHOLECYSTECTOMY     COLONOSCOPY  06/29/2012   normal.  Repeat in 5 years.   DILATION AND CURETTAGE OF UTERUS     ECTOPIC PREGNANCY SURGERY  11/12/1991   ESOPHAGOGASTRODUODENOSCOPY  12/13/2011   dysphagia.  Henrene Pastor.  Normal.   GASTRIC ROUX-EN-Y N/A 07/06/2018    Procedure: LAPAROSCOPIC ROUX-EN-Y GASTRIC BYPASS WITH UPPER ENDOSCOPY;  Surgeon: Excell Seltzer, MD;  Location[facility];  Service: General;  Laterality: N/A;   Sleep study  09/11/2012   severe OSA.  CPAP titration at 12 cm water pressure.   TOTAL KNEE ARTHROPLASTY Left 05/31/2021   Procedure: LEFT TOTAL KNEE ARTHROPLASTY;  Surgeon: Mcarthur Rossetti, MD;  Location: [place];  Service: Orthopedics;  Laterality: Left;   TUBAL LIGATION  11/11/1986    There were no vitals filed for this visit.   Subjective Assessment - 08/16/21 1556     Subjective Pt. indicated last few days has been more painful that previous.  Saw MD with no specifics reported, Pt. reported things "looked good."    Limitations Walking;House hold activities    Diagnostic tests X-ray    Patient Stated Goals Walk without pain, get my leg moving    Currently in Pain? Yes    Pain Score 4     Pain Location Knee    Pain Orientation Left;Anterior    Pain Descriptors / Indicators  Aching;Sore    Pain Type Surgical pain    Pain Onset More than a month ago    Pain Frequency Intermittent    Aggravating Factors  unsure of why more today    Pain Relieving Factors nothing specific reported.                [facility] PT Assessment - 08/16/21 0001       Assessment   Medical Diagnosis left TKA    Referring Provider (PT) Erskine Emery PA-C    Onset Date/Surgical Date 05/31/21    Hand Dominance Right      Observation/Other Assessments   Focus on Therapeutic Outcomes (FOTO)  59% updated                           [facility] Adult PT Treatment/Exercise - 08/16/21 0001       Knee/Hip Exercises: Stretches   Gastroc Stretch 60 seconds;3 reps;Both   incline board   Other Knee/Hip Stretches supine heel prop 5 mins      Knee/Hip Exercises: Aerobic   Recumbent Bike Lvl 3 10 mins seat 5      Knee/Hip Exercises: Machines for Strengthening   Cybex Knee Extension Eccentric lowering Lt only 3 x 10 10 lbs       Knee/Hip Exercises: Seated   Other Seated Knee/Hip Exercises seated slr 3 x 10 on Lt    Sit to Sand without UE support   18 inch chair, fast up/slow down 2 x 10                      PT Short Term Goals - 07/11/21 1332       PT SHORT TERM GOAL #1   Title She will be independent with intial HEP.    Status Achieved      PT SHORT TERM GOAL #2   Title Pt will report decr pain and be  able to stand 10 min or more before increased pain    Baseline 30 minutes    Status Achieved               PT Long Term Goals - 08/09/21 1624       PT LONG TERM GOAL #1   Title Pt will be independent in her advanced HEP.    Time 12    Period Weeks    Status On-going    Target Date 09/14/21      PT LONG TERM GOAL #2   Title She will be able to walk for > 20 min with pain in knee to </= 3/10 with no device.    Time 12    Period Weeks    Status On-going    Target Date 09/14/21      PT LONG TERM GOAL #3   Title Pt will improve her left knee flexion and extension from -5 to 120 degrees    Time 12    Period Weeks    Status On-going    Target Date 09/14/21      PT LONG TERM GOAL #4   Title Pt will improve her left knee strength to grossly +4/5.    Time 12    Period Weeks    Status On-going    Target Date 09/14/21      PT LONG TERM GOAL #5   Title FOTO improved to >/= 58 % function.    Time 12    Period Weeks  Status On-going    Target Date 09/14/21                   Plan - 08/16/21 1559     Clinical Impression Statement A reduction in standing and loading activity today to accomodate pain complaints upon arrival.    Personal Factors and Comorbidities Comorbidity 3+    Comorbidities anemia arthritis, asthma, depression, DM, fibromyalgia, back pain, GERD, HA, hemorrhoid, IBS, hyperlipidemia, Iron deficiency, obesity, sleep apnea,    Examination-Activity Limitations Stairs;Squat;Sleep;Transfers;Stand;Locomotion Level    Examination-Participation Restrictions  Community Activity;Other;Occupation;Cleaning    Stability/Clinical Decision Making Stable/Uncomplicated    Rehab Potential Good    PT Frequency 2x / week    PT Duration 12 weeks    PT Treatment/Interventions ADLs/Self Care Home Management;Cryotherapy;Electrical Stimulation;Moist Heat;Gait training;Stair training;Functional mobility training;Therapeutic activities;Therapeutic exercise;Balance training;Neuromuscular re-education;Patient/family education;Passive range of motion;Manual techniques;Dry needling;Taping;Vasopneumatic Device    PT Next Visit Plan Eccentric strengthening, compliant surface balance, stairs.    PT Home Exercise Plan Access Code: [email or web address]    Consulted and Agree with Plan of Care Patient             Patient will benefit from skilled therapeutic intervention in order to improve the following deficits and impairments:  Pain, Decreased mobility, Decreased strength, Increased edema, Decreased balance, Decreased activity tolerance, Difficulty walking, Decreased range of motion, Impaired flexibility, Abnormal gait  Visit Diagnosis: Acute pain of left knee  Stiffness of left knee, not elsewhere classified  Muscle weakness (generalized)  Localized edema  Difficulty in walking, not elsewhere classified     Problem List Patient Active Problem List   Diagnosis Date Noted   Status post left knee replacement 06/01/2021   Status post total left knee replacement 05/31/2021   Unilateral primary osteoarthritis, left knee 12/21/2020   Morbid obesity with body mass index (BMI) of 45.0 to 49.9 in adult ([place]) 07/06/2018   Type 2 diabetes mellitus without complication, without long-term current use of insulin ([place]) 05/22/2017   Class 3 obesity with serious comorbidity and body mass index (BMI) of 40.0 to 44.9 in adult 05/21/2017   Family history of colon cancer 04/01/2017   Irritable bowel syndrome with diarrhea 04/01/2017   NSAID long-term use 04/01/2017   Lymphocytosis  02/24/2017   Essential hypertension 02/12/2017   Mild intermittent asthma with acute exacerbation 12/12/2016   Left shoulder pain 06/02/2015   Insomnia 05/12/2014   Fibromyalgia 05/12/2014   Allergic rhinitis 03/28/2014   Osteoarthritis 12/20/2013   Cervical strain 06/21/2013   Low back pain 06/21/2013   Metatarsalgia of both feet 03/11/2013   Sciatica 01/25/2013   Restless leg syndrome 11/24/2012   Pure hypercholesterolemia 10/20/2012   OSA on CPAP 10/20/2012   Depression 10/20/2012   Iron deficiency anemia, unspecified 10/01/2012   Snoring 10/01/2012   Diabetes mellitus, type 2 ([place]) 12/31/2010   ANAL FISSURE 12/31/2010   Morbid obesity ([place]) 01/08/2007   CONSTIPATION 01/08/2007    Scot Jun, PT, DPT, OCS, ATC 08/16/21  4:29 PM    [facility] [address] [place], [place], [address] Phone: [phone]   Fax:  [phone]  Name: Cristina [name] MRN: [id number] Date of Birth: 01-Apr-1961

## 2021-08-21 ENCOUNTER — Other Ambulatory Visit (HOSPITAL_COMMUNITY): Payer: Self-pay

## 2021-08-28 ENCOUNTER — Other Ambulatory Visit: Payer: Self-pay

## 2021-08-28 ENCOUNTER — Encounter: Payer: Self-pay | Admitting: Rehabilitative and Restorative Service Providers"

## 2021-08-28 ENCOUNTER — Ambulatory Visit (INDEPENDENT_AMBULATORY_CARE_PROVIDER_SITE_OTHER): Payer: 59 | Admitting: Rehabilitative and Restorative Service Providers"

## 2021-08-28 DIAGNOSIS — M25562 Pain in left knee: Secondary | ICD-10-CM | POA: Diagnosis not present

## 2021-08-28 DIAGNOSIS — R600 Localized edema: Secondary | ICD-10-CM

## 2021-08-28 DIAGNOSIS — M6281 Muscle weakness (generalized): Secondary | ICD-10-CM

## 2021-08-28 DIAGNOSIS — M25662 Stiffness of left knee, not elsewhere classified: Secondary | ICD-10-CM

## 2021-08-28 DIAGNOSIS — R262 Difficulty in walking, not elsewhere classified: Secondary | ICD-10-CM

## 2021-08-28 NOTE — Therapy (Addendum)
[facility] [place], [place], [address] Phone: [phone]   Fax:  [phone]  Physical Therapy Treatment /Discharge   Patient Details  Name: Cristina [name] MRN: [id number] Date of Birth: [birth year] Referring Provider (PT): Erskine Emery PA-C   Encounter Date: 08/28/2021   PT End of Session - 08/28/21 1557     Visit Number 16    Number of Visits 24    Date for PT Re-Evaluation 09/14/21    Authorization Type UMR    Progress Note Due on Visit 5    PT Start Time 1555    PT Stop Time 1634    PT Time Calculation (min) 39 min    Activity Tolerance Patient tolerated treatment well    Behavior During Therapy [facility] for tasks assessed/performed             Past Medical History:  Diagnosis Date   Allergic rhinitis, cause unspecified    Anemia    Arthritis    Asthma    Back pain    Chest pain    Constipation    CTS (carpal tunnel syndrome)    Cystocele    Depression    Diabetes mellitus    Dysfunction of eustachian tube    Fatty liver    Fibromyalgia    Fissure, anal    Gallbladder problem    Genital herpes    GERD (gastroesophageal reflux disease)    Headache    Hemorrhoid    Hx of migraine headaches    Hyperlipidemia    IBS (irritable bowel syndrome)    Insomnia    Iron deficiency anemia, unspecified    Joint pain    Nausea    Obesity    OSA on CPAP    Osteoarthritis    Rectocele    Sleep apnea    TMJ (dislocation of temporomandibular joint)     Past Surgical History:  Procedure Laterality Date   ABDOMINAL HYSTERECTOMY  11/11/2010   [place] Plan.  Ovaries intact.  Uterine fibroids with DUB.   CARPAL TUNNEL RELEASE  11/12/1987   Left   CHOLECYSTECTOMY     CHOLECYSTECTOMY     COLONOSCOPY  06/29/2012   normal.  Repeat in 5 years.   DILATION AND CURETTAGE OF UTERUS     ECTOPIC PREGNANCY SURGERY  11/12/1991   ESOPHAGOGASTRODUODENOSCOPY  12/13/2011   dysphagia.  Henrene Pastor.  Normal.   GASTRIC ROUX-EN-Y N/A  07/06/2018   Procedure: LAPAROSCOPIC ROUX-EN-Y GASTRIC BYPASS WITH UPPER ENDOSCOPY;  Surgeon: Excell Seltzer, MD;  Location: [facility];  Service: General;  Laterality: N/A;   Sleep study  09/11/2012   severe OSA.  CPAP titration at 12 cm water pressure.   TOTAL KNEE ARTHROPLASTY Left 05/31/2021   Procedure: LEFT TOTAL KNEE ARTHROPLASTY;  Surgeon: Mcarthur Rossetti, MD;  Location: [facility];  Service: Orthopedics;  Laterality: Left;   TUBAL LIGATION  11/11/1986    There were no vitals filed for this visit.   Subjective Assessment - 08/28/21 1557     Subjective Pt. indicated no pain today.  Pt. stated having some days better some days not better.  Overall doing well with exercises.    Limitations Walking;House hold activities    Diagnostic tests X-ray    Patient Stated Goals Walk without pain, get my leg moving    Currently in Pain? No/denies    Pain Score 0-No pain    Pain Onset More than a month ago  [facility] PT Assessment - 08/28/21 0001       Assessment   Medical Diagnosis left TKA    Referring Provider (PT) Erskine Emery PA-C    Onset Date/Surgical Date 05/31/21    Hand Dominance Right      AROM   Left Knee Extension -14   in seated LAQ   Left Knee Flexion 107      PROM   Left Knee Extension -10   supine     Strength   Right Knee Extension 4/5   24.5, 19 lbs   Left Knee Extension 5/5   35, 38 lbs                          [facility] Adult PT Treatment/Exercise - 08/28/21 0001       Knee/Hip Exercises: Stretches   Other Knee/Hip Stretches supine heel prop 2 lbs on knee      Knee/Hip Exercises: Aerobic   Recumbent Bike Lvl 3 10 mins seat 4      Knee/Hip Exercises: Machines for Strengthening   Cybex Knee Extension Eccentric lowering Lt only 3 x 10 15 lbs      Knee/Hip Exercises: Seated   Other Seated Knee/Hip Exercises seated SLR 2 x 10 on Lt      Manual Therapy   Manual therapy comments supine tibial ER?IR mobilization into  extension g4 Lt leg                       PT Short Term Goals - 07/11/21 1332       PT SHORT TERM GOAL #1   Title She will be independent with intial HEP.    Status Achieved      PT SHORT TERM GOAL #2   Title Pt will report decr pain and be  able to stand 10 min or more before increased pain    Baseline 30 minutes    Status Achieved               PT Long Term Goals - 08/28/21 1621       PT LONG TERM GOAL #1   Title Pt will be independent in her advanced HEP.    Time 12    Period Weeks    Status On-going    Target Date 09/14/21      PT LONG TERM GOAL #2   Title She will be able to walk for > 20 min with pain in knee to </= 3/10 with no device.    Time 12    Period Weeks    Status On-going    Target Date 09/14/21      PT LONG TERM GOAL #3   Title Pt will improve her left knee flexion and extension from -5 to 120 degrees    Time 12    Period Weeks    Status On-going    Target Date 09/14/21      PT LONG TERM GOAL #4   Title Pt will improve her left knee strength to grossly +4/5.    Time 12    Period Weeks    Status Achieved      PT LONG TERM GOAL #5   Title FOTO improved to >/= 58 % function.    Time 12    Period Weeks    Status On-going    Target Date 09/14/21  Plan - 08/28/21 1613     Clinical Impression Statement Extension mobility deficits still evident and most obvious impairment to limit function at this time, noted in ambulation movement.  Reviewed consisting use of seated or supine heel prop long duration stretching to attempt to improve mobility.    Personal Factors and Comorbidities Comorbidity 3+    Comorbidities anemia arthritis, asthma, depression, DM, fibromyalgia, back pain, GERD, HA, hemorrhoid, IBS, hyperlipidemia, Iron deficiency, obesity, sleep apnea,    Examination-Activity Limitations Stairs;Squat;Sleep;Transfers;Stand;Locomotion Level    Examination-Participation Restrictions Community  Activity;Other;Occupation;Cleaning    Stability/Clinical Decision Making Stable/Uncomplicated    Rehab Potential Good    PT Frequency 2x / week    PT Duration 12 weeks    PT Treatment/Interventions ADLs/Self Care Home Management;Cryotherapy;Electrical Stimulation;Moist Heat;Gait training;Stair training;Functional mobility training;Therapeutic activities;Therapeutic exercise;Balance training;Neuromuscular re-education;Patient/family education;Passive range of motion;Manual techniques;Dry needling;Taping;Vasopneumatic Device    PT Next Visit Plan Eccentric strengthening, extension focus in clinic and long term for HEP    PT Home Exercise Plan Access Code: [email or web address]    Consulted and Agree with Plan of Care Patient             Patient will benefit from skilled therapeutic intervention in order to improve the following deficits and impairments:  Pain, Decreased mobility, Decreased strength, Increased edema, Decreased balance, Decreased activity tolerance, Difficulty walking, Decreased range of motion, Impaired flexibility, Abnormal gait  Visit Diagnosis: Acute pain of left knee  Stiffness of left knee, not elsewhere classified  Muscle weakness (generalized)  Localized edema  Difficulty in walking, not elsewhere classified     Problem List Patient Active Problem List   Diagnosis Date Noted   Status post left knee replacement 06/01/2021   Status post total left knee replacement 05/31/2021   Unilateral primary osteoarthritis, left knee 12/21/2020   Morbid obesity with body mass index (BMI) of 45.0 to 49.9 in adult ([place]) 07/06/2018   Type 2 diabetes mellitus without complication, without long-term current use of insulin ([place]) 05/22/2017   Class 3 obesity with serious comorbidity and body mass index (BMI) of 40.0 to 44.9 in adult 05/21/2017   Family history of colon cancer 04/01/2017   Irritable bowel syndrome with diarrhea 04/01/2017   NSAID long-term use 04/01/2017   Lymphocytosis  02/24/2017   Essential hypertension 02/12/2017   Mild intermittent asthma with acute exacerbation 12/12/2016   Left shoulder pain 06/02/2015   Insomnia 05/12/2014   Fibromyalgia 05/12/2014   Allergic rhinitis 03/28/2014   Osteoarthritis 12/20/2013   Cervical strain 06/21/2013   Low back pain 06/21/2013   Metatarsalgia of both feet 03/11/2013   Sciatica 01/25/2013   Restless leg syndrome 11/24/2012   Pure hypercholesterolemia 10/20/2012   OSA on CPAP 10/20/2012   Depression 10/20/2012   Iron deficiency anemia, unspecified 10/01/2012   Snoring 10/01/2012   Diabetes mellitus, type 2 ([place]) 12/31/2010   ANAL FISSURE 12/31/2010   Morbid obesity ([place]) 01/08/2007   CONSTIPATION 01/08/2007    Scot Jun, PT, DPT, OCS, ATC 08/28/21  4:33 PM  PHYSICAL THERAPY DISCHARGE SUMMARY  Visits from Start of Care: 16  Current functional level related to goals / functional outcomes: See note   Remaining deficits: See note   Education / Equipment: HEP   Patient agrees to discharge. Patient goals were partially met. Patient is being discharged due to not returning since the last visit.  Scot Jun, PT, DPT, OCS, ATC 10/18/21  10:48 AM       [facility] [place],  [place], [address] Phone: [phone]   Fax:  [phone]  Name: Cristina [name] MRN: [id number] Date of Birth: 24-Aug-1961

## 2021-08-29 ENCOUNTER — Other Ambulatory Visit (HOSPITAL_COMMUNITY): Payer: Self-pay

## 2021-09-03 ENCOUNTER — Other Ambulatory Visit (HOSPITAL_COMMUNITY): Payer: Self-pay

## 2021-09-03 ENCOUNTER — Encounter: Payer: Self-pay | Admitting: Orthopaedic Surgery

## 2021-09-03 ENCOUNTER — Other Ambulatory Visit: Payer: Self-pay | Admitting: Orthopaedic Surgery

## 2021-09-03 MED ORDER — CYCLOBENZAPRINE HCL 10 MG PO TABS
10.0000 mg | ORAL_TABLET | Freq: Three times a day (TID) | ORAL | 0 refills | Status: DC | PRN
  Filled 2021-09-03: qty 30, 10d supply, fill #0

## 2021-09-03 MED ORDER — HYDROCODONE-ACETAMINOPHEN 5-325 MG PO TABS
1.0000 | ORAL_TABLET | Freq: Four times a day (QID) | ORAL | 0 refills | Status: DC | PRN
  Filled 2021-09-03: qty 30, 8d supply, fill #0

## 2021-09-04 ENCOUNTER — Other Ambulatory Visit (HOSPITAL_COMMUNITY): Payer: Self-pay

## 2021-09-04 ENCOUNTER — Encounter: Payer: 59 | Admitting: Rehabilitative and Restorative Service Providers"

## 2021-09-04 DIAGNOSIS — Z23 Encounter for immunization: Secondary | ICD-10-CM | POA: Diagnosis not present

## 2021-09-04 DIAGNOSIS — F331 Major depressive disorder, recurrent, moderate: Secondary | ICD-10-CM | POA: Diagnosis not present

## 2021-09-04 DIAGNOSIS — Z0000 Encounter for general adult medical examination without abnormal findings: Secondary | ICD-10-CM | POA: Diagnosis not present

## 2021-09-04 DIAGNOSIS — E119 Type 2 diabetes mellitus without complications: Secondary | ICD-10-CM | POA: Diagnosis not present

## 2021-09-04 DIAGNOSIS — E7800 Pure hypercholesterolemia, unspecified: Secondary | ICD-10-CM | POA: Diagnosis not present

## 2021-09-04 MED ORDER — ESCITALOPRAM OXALATE 10 MG PO TABS
10.0000 mg | ORAL_TABLET | Freq: Every day | ORAL | 4 refills | Status: DC
  Filled 2021-09-04: qty 90, 90d supply, fill #0
  Filled 2021-12-06: qty 90, 90d supply, fill #1

## 2021-09-04 MED ORDER — METFORMIN HCL 500 MG PO TABS
1000.0000 mg | ORAL_TABLET | Freq: Every day | ORAL | 4 refills | Status: DC
  Filled 2021-09-04 – 2021-12-06 (×2): qty 180, 90d supply, fill #0

## 2021-09-05 ENCOUNTER — Other Ambulatory Visit: Payer: Self-pay

## 2021-09-05 ENCOUNTER — Other Ambulatory Visit: Payer: 59 | Admitting: *Deleted

## 2021-09-05 DIAGNOSIS — E7800 Pure hypercholesterolemia, unspecified: Secondary | ICD-10-CM

## 2021-09-05 DIAGNOSIS — E119 Type 2 diabetes mellitus without complications: Secondary | ICD-10-CM | POA: Diagnosis not present

## 2021-09-05 LAB — COMPREHENSIVE METABOLIC PANEL
ALT: 58 IU/L — ABNORMAL HIGH (ref 0–32)
AST: 23 IU/L (ref 0–40)
Albumin/Globulin Ratio: 1.9 (ref 1.2–2.2)
Albumin: 4.5 g/dL (ref 3.8–4.9)
Alkaline Phosphatase: 114 IU/L (ref 44–121)
BUN/Creatinine Ratio: 16 (ref 9–23)
BUN: 11 mg/dL (ref 6–24)
Bilirubin Total: 0.4 mg/dL (ref 0.0–1.2)
CO2: 23 mmol/L (ref 20–29)
Calcium: 9.4 mg/dL (ref 8.7–10.2)
Chloride: 106 mmol/L (ref 96–106)
Creatinine, Ser: 0.7 mg/dL (ref 0.57–1.00)
Globulin, Total: 2.4 g/dL (ref 1.5–4.5)
Glucose: 158 mg/dL — ABNORMAL HIGH (ref 70–99)
Potassium: 4.6 mmol/L (ref 3.5–5.2)
Sodium: 143 mmol/L (ref 134–144)
Total Protein: 6.9 g/dL (ref 6.0–8.5)
eGFR: 100 mL/min/{1.73_m2} (ref >59)

## 2021-09-05 LAB — LIPID PANEL
Chol/HDL Ratio: 3 ratio (ref 0.0–4.4)
Cholesterol, Total: 151 mg/dL (ref 100–199)
HDL: 50 mg/dL (ref >39)
LDL Chol Calc (NIH): 73 mg/dL (ref 0–99)
Triglycerides: 167 mg/dL — ABNORMAL HIGH (ref 0–149)
VLDL Cholesterol Cal: 28 mg/dL (ref 5–40)

## 2021-09-05 LAB — CBC WITH DIFFERENTIAL/PLATELET
Basophils Absolute: 0.1 10*3/uL (ref 0.0–0.2)
Basos: 1 %
EOS (ABSOLUTE): 0.2 10*3/uL (ref 0.0–0.4)
Eos: 2 %
Hematocrit: 43.8 % (ref 34.0–46.6)
Hemoglobin: 14.4 g/dL (ref 11.1–15.9)
Immature Grans (Abs): 0 10*3/uL (ref 0.0–0.1)
Immature Granulocytes: 0 %
Lymphocytes Absolute: 5.7 10*3/uL — ABNORMAL HIGH (ref 0.7–3.1)
Lymphs: 49 %
MCH: 27.9 pg (ref 26.6–33.0)
MCHC: 32.9 g/dL (ref 31.5–35.7)
MCV: 85 fL (ref 79–97)
Monocytes Absolute: 0.6 10*3/uL (ref 0.1–0.9)
Monocytes: 6 %
Neutrophils Absolute: 4.7 10*3/uL (ref 1.4–7.0)
Neutrophils: 42 %
Platelets: 336 10*3/uL (ref 150–450)
RBC: 5.16 x10E6/uL (ref 3.77–5.28)
RDW: 13.1 % (ref 11.7–15.4)
WBC: 11.2 10*3/uL — ABNORMAL HIGH (ref 3.4–10.8)

## 2021-09-05 LAB — LDL CHOLESTEROL, DIRECT: LDL Direct: 73 mg/dL (ref 0–99)

## 2021-09-05 LAB — TSH: TSH: 3.74 u[IU]/mL (ref 0.450–4.500)

## 2021-09-05 NOTE — Progress Notes (Signed)
Orders received from Reginia Forts, MD at [facility] for patient to have labs drawn (CMP, CBC w/ Diff, Lipid Panel w/ Direct LDL, and TSH).

## 2021-09-10 ENCOUNTER — Encounter: Payer: 59 | Admitting: Rehabilitative and Restorative Service Providers"

## 2021-09-10 ENCOUNTER — Telehealth: Payer: Self-pay | Admitting: Rehabilitative and Restorative Service Providers"

## 2021-09-10 NOTE — Telephone Encounter (Signed)
Pt. Indicated she thought visit was tomorrow.  Will call back if she desires to schedule.  Scot Jun, PT, DPT, OCS, ATC 09/10/21  4:18 PM

## 2021-09-11 ENCOUNTER — Other Ambulatory Visit (HOSPITAL_COMMUNITY): Payer: Self-pay

## 2021-09-17 ENCOUNTER — Encounter: Payer: 59 | Admitting: Rehabilitative and Restorative Service Providers"

## 2021-09-27 ENCOUNTER — Other Ambulatory Visit (HOSPITAL_COMMUNITY): Payer: Self-pay

## 2021-09-27 DIAGNOSIS — R110 Nausea: Secondary | ICD-10-CM | POA: Diagnosis not present

## 2021-09-27 DIAGNOSIS — K219 Gastro-esophageal reflux disease without esophagitis: Secondary | ICD-10-CM | POA: Diagnosis not present

## 2021-09-27 DIAGNOSIS — Z9884 Bariatric surgery status: Secondary | ICD-10-CM | POA: Diagnosis not present

## 2021-09-27 DIAGNOSIS — E119 Type 2 diabetes mellitus without complications: Secondary | ICD-10-CM | POA: Diagnosis not present

## 2021-09-27 MED ORDER — ESOMEPRAZOLE MAGNESIUM 40 MG PO CPDR
40.0000 mg | DELAYED_RELEASE_CAPSULE | Freq: Every day | ORAL | 4 refills | Status: DC
  Filled 2021-09-27: qty 90, 90d supply, fill #0
  Filled 2021-12-06 – 2022-01-01 (×2): qty 90, 90d supply, fill #1
  Filled 2022-04-09: qty 90, 90d supply, fill #2
  Filled 2022-05-31 – 2022-07-16 (×2): qty 90, 90d supply, fill #3

## 2021-09-28 ENCOUNTER — Other Ambulatory Visit (HOSPITAL_COMMUNITY): Payer: Self-pay

## 2021-09-28 MED ORDER — CARESTART COVID-19 HOME TEST VI KIT
PACK | 0 refills | Status: DC
  Filled 2021-09-28: qty 4, 4d supply, fill #0

## 2021-10-05 ENCOUNTER — Other Ambulatory Visit (HOSPITAL_COMMUNITY): Payer: Self-pay

## 2021-10-08 ENCOUNTER — Other Ambulatory Visit: Payer: Self-pay

## 2021-10-08 ENCOUNTER — Other Ambulatory Visit (HOSPITAL_COMMUNITY): Payer: Self-pay

## 2021-10-08 ENCOUNTER — Ambulatory Visit (HOSPITAL_COMMUNITY)
Admission: EM | Admit: 2021-10-08 | Discharge: 2021-10-08 | Disposition: A | Discharge status: Home / Self Care | Payer: 59 | Attending: Student | Admitting: Student

## 2021-10-08 ENCOUNTER — Encounter (HOSPITAL_COMMUNITY): Payer: Self-pay

## 2021-10-08 DIAGNOSIS — R0981 Nasal congestion: Secondary | ICD-10-CM | POA: Diagnosis not present

## 2021-10-08 DIAGNOSIS — Z112 Encounter for screening for other bacterial diseases: Secondary | ICD-10-CM | POA: Diagnosis not present

## 2021-10-08 DIAGNOSIS — J029 Acute pharyngitis, unspecified: Secondary | ICD-10-CM

## 2021-10-08 DIAGNOSIS — Z20818 Contact with and (suspected) exposure to other bacterial communicable diseases: Secondary | ICD-10-CM | POA: Insufficient documentation

## 2021-10-08 DIAGNOSIS — R0989 Other specified symptoms and signs involving the circulatory and respiratory systems: Secondary | ICD-10-CM | POA: Diagnosis not present

## 2021-10-08 DIAGNOSIS — R059 Cough, unspecified: Secondary | ICD-10-CM

## 2021-10-08 LAB — POCT RAPID STREP A, ED / UC: Streptococcus, Group A Screen (Direct): NEGATIVE

## 2021-10-08 MED ORDER — PREDNISONE 20 MG PO TABS
20.0000 mg | ORAL_TABLET | Freq: Every day | ORAL | 0 refills | Status: DC
  Filled 2021-10-08: qty 5, 5d supply, fill #0

## 2021-10-08 NOTE — ED Provider Notes (Signed)
[facility]    CSN: [id number] Arrival date & time: 10/08/21  9201      History   Chief Complaint Chief Complaint  Patient presents with   Cough   Sore Throat   Headache   Fever    HPI Cristina [name] is a 60 y.o. female presenting with viral syndrome x6 days. Medical history asthma controlled on albuterol.  Describes 6 days of cough, fevers and chills as high as 101, throbbing headaches, sore throat, nasal congestion.  States that she was exposed to strep, and her throat is still sore.  Cough is productive of yellow mucus, she has not required her albuterol inhaler during present illness.  Denies shortness of breath, chest pain, dizziness.  Tolerating fluids and food.  Has not taken medications today.  HPI  Past Medical History:  Diagnosis Date   Allergic rhinitis, cause unspecified    Anemia    Arthritis    Asthma    Back pain    Chest pain    Constipation    CTS (carpal tunnel syndrome)    Cystocele    Depression    Diabetes mellitus    Dysfunction of eustachian tube    Fatty liver    Fibromyalgia    Fissure, anal    Gallbladder problem    Genital herpes    GERD (gastroesophageal reflux disease)    Headache    Hemorrhoid    Hx of migraine headaches    Hyperlipidemia    IBS (irritable bowel syndrome)    Insomnia    Iron deficiency anemia, unspecified    Joint pain    Nausea    Obesity    OSA on CPAP    Osteoarthritis    Rectocele    Sleep apnea    TMJ (dislocation of temporomandibular joint)     Patient Active Problem List   Diagnosis Date Noted   Status post left knee replacement 06/01/2021   Status post total left knee replacement 05/31/2021   Unilateral primary osteoarthritis, left knee 12/21/2020   Morbid obesity with body mass index (BMI) of 45.0 to 49.9 in adult ([place]) 07/06/2018   Type 2 diabetes mellitus without complication, without long-term current use of insulin ([place]) 05/22/2017   Class 3 obesity with serious comorbidity and  body mass index (BMI) of 40.0 to 44.9 in adult 05/21/2017   Family history of colon cancer 04/01/2017   Irritable bowel syndrome with diarrhea 04/01/2017   NSAID long-term use 04/01/2017   Lymphocytosis 02/24/2017   Essential hypertension 02/12/2017   Mild intermittent asthma with acute exacerbation 12/12/2016   Left shoulder pain 06/02/2015   Insomnia 05/12/2014   Fibromyalgia 05/12/2014   Allergic rhinitis 03/28/2014   Osteoarthritis 12/20/2013   Cervical strain 06/21/2013   Low back pain 06/21/2013   Metatarsalgia of both feet 03/11/2013   Sciatica 01/25/2013   Restless leg syndrome 11/24/2012   Pure hypercholesterolemia 10/20/2012   OSA on CPAP 10/20/2012   Depression 10/20/2012   Iron deficiency anemia, unspecified 10/01/2012   Snoring 10/01/2012   Diabetes mellitus, type 2 ([place]) 12/31/2010   ANAL FISSURE 12/31/2010   Morbid obesity ([place]) 01/08/2007   CONSTIPATION 01/08/2007    Past Surgical History:  Procedure Laterality Date   ABDOMINAL HYSTERECTOMY  11/11/2010   Marvel Plan.  Ovaries intact.  Uterine fibroids with DUB.   CARPAL TUNNEL RELEASE  11/12/1987   Left   CHOLECYSTECTOMY     CHOLECYSTECTOMY     COLONOSCOPY  06/29/2012   normal.  Repeat in 5 years.   DILATION AND CURETTAGE OF UTERUS     ECTOPIC PREGNANCY SURGERY  11/12/1991   ESOPHAGOGASTRODUODENOSCOPY  12/13/2011   dysphagia.  Henrene Pastor.  Normal.   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  CPAP titration at 12 cm water pressure.   TOTAL KNEE ARTHROPLASTY Left 05/31/2021   Procedure: LEFT TOTAL KNEE ARTHROPLASTY;  Surgeon: Mcarthur Rossetti, MD;  Location: [facility];  Service: Orthopedics;  Laterality: Left;   TUBAL LIGATION  11/11/1986    OB History     Gravida  3   Para      Term      Preterm      AB       Living         SAB      IAB      Ectopic      Multiple      Live Births               Home Medications    Prior to Admission medications   Medication Sig Start Date End Date Taking? Authorizing Provider  predniSONE (DELTASONE) 20 MG tablet Take 1 tablet (20 mg total) by mouth daily for 5 days. 10/08/21 10/13/21 Yes Hazel Sams, PA-C  acyclovir (ZOVIRAX) 400 MG tablet Take 1 tablet (400 mg total) by mouth 2 (two) times daily. 02/27/21     albuterol (VENTOLIN HFA) 108 (90 Base) MCG/ACT inhaler Inhale 1 puff into the lungs every 6 (six) hours as needed for wheezing or shortness of breath.    [provider]  ANUCORT-HC 25 MG suppository PLACE 1 SUPPOSITORY RECTALLY 2 TIMES DAILY AS NEEDED FOR HEMORRHOIDS. Patient taking differently: Place 25 mg rectally 2 (two) times daily as needed for hemorrhoids. PLACE 1 SUPPOSITORY RECTALLY 2 TIMES DAILY AS NEEDED FOR HEMORRHOIDS. 05/21/17   [name], MD  aspirin 81 MG chewable tablet Chew 1 tablet (81 mg total) by mouth 2 (two) times daily. 06/01/21   Mcarthur Rossetti, MD  atorvastatin (LIPITOR) 20 MG tablet Take 1 tablet (20 mg total) by mouth daily. 02/27/21     azelastine (ASTELIN) 0.1 % nasal spray PLACE 2 SPRAYS INTO BOTH NOSTRILS 2 TIMES DAILY AS DIRECTED Patient taking differently: Place 2 sprays into both nostrils 2 (two) times daily as needed for rhinitis or allergies. PLACE 2 SPRAYS INTO BOTH NOSTRILS 2 TIMES DAILY AS DIRECTE 05/21/17   [name], MD  azelastine (OPTIVAR) 0.05 % ophthalmic solution Place 1 drop into both eyes 2 (two) times daily as needed. Patient taking differently: Place 1 drop into both eyes 2 (two) times daily as needed (allergies). 02/27/21     buPROPion (WELLBUTRIN) 100 MG tablet Take 1 tablet (100 mg total) by mouth daily. 02/27/21     calcium carbonate (OS-CAL) 1250 (500 Ca) MG chewable tablet Chew 1 tablet by mouth daily.    [provider]  cetirizine (ZYRTEC) 10 MG  tablet Take 1 tablet (10 mg total) by mouth once daily 02/27/21     COVID-19 At Home Antigen Test [facility] COVID-19 HOME TEST) KIT Use as directed 09/28/21   Jefm Bryant, RPH  cyclobenzaprine (FLEXERIL) 10 MG tablet Take 1 tablet (10 mg total) by mouth 3 (three) times daily as needed for muscle spasms. 09/03/21  Mcarthur Rossetti, MD  diclofenac Sodium (VOLTAREN) 1 % GEL Apply topically 4 (four) times daily. Patient taking differently: Apply 4 g topically 4 (four) times daily as needed (pain). 05/27/20     diltiazem 2 % GEL Apply 1 application topically 3 (three) times daily. Patient taking differently: Apply 1 application topically 3 (three) times daily as needed (anal fissure). 04/01/17   Zehr, Laban Emperor, PA-C  empagliflozin (JARDIANCE) 25 MG TABS tablet Take 1 tablet (25 mg total) by mouth daily with breakfast. 02/27/21     escitalopram (LEXAPRO) 10 MG tablet TAKE 1 TABLET BY MOUTH ONCE DAILY 11/17/20   [name], MD  escitalopram (LEXAPRO) 10 MG tablet Take 1 tablet (10 mg total) by mouth once daily 09/04/21     esomeprazole (NEXIUM) 40 MG capsule Take 1 capsule (40 mg total) by mouth daily. 09/27/21     fluticasone (FLONASE) 50 MCG/ACT nasal spray Place 2 sprays into both nostrils daily. 02/27/21     gabapentin (NEURONTIN) 300 MG capsule Take 1 capsule (300 mg total) by mouth 2 (two) times daily 02/27/21     glucose blood test strip accucheck guide strips and fast clix lancets Use as instructed dm 2 04/01/18   [name], MD  HYDROcodone-acetaminophen (NORCO/VICODIN) 5-325 MG tablet Take 1 tablet by mouth every 6 (six) hours as needed for moderate pain. 09/03/21   Mcarthur Rossetti, MD  hydrocortisone 2.5 % ointment Apply topically 2 (two) times daily. Patient taking differently: Apply 1 application topically 2 (two) times daily as needed (rash). 04/01/18   [name], MD  MELATONIN PO Take 15 mg by mouth at bedtime.    [provider]  metFORMIN (GLUCOPHAGE)  500 MG tablet Take 1 tablet (500 mg total) by mouth daily with supper. 02/27/21     metFORMIN (GLUCOPHAGE) 500 MG tablet Take 2 tablets (1,000 mg total) by mouth daily with supper. 09/04/21     Multiple Vitamins-Minerals (MULTIVITAMIN WITH MINERALS) tablet Take 1 tablet by mouth daily.    [provider]  nitroGLYCERIN (NITROSTAT) 0.4 MG SL tablet Place 1 tablet (0.4 mg total) under the tongue every 5 (five) minutes as needed for chest pain. 10/30/16   [name], MD  oxyCODONE (OXY IR/ROXICODONE) 5 MG immediate release tablet Take 1-2 tablets (5-10 mg total) by mouth every 6 (six) hours as needed for moderate pain (pain score 4-6). 07/26/21   Pete Pelt, PA-C  pantoprazole (PROTONIX) 40 MG tablet Take 1 tablet (40 mg total) by mouth 2 (two) times daily 02/27/21     TRUEPLUS LANCETS 30G MISC USE TO CHECK BLOOD GLUCOSE DAILY AS DIRECTED 05/21/17   [name], MD    Family History Family History  Problem Relation Age of Onset   Hypertension Mother 74   Osteoarthritis Mother    Diabetes Mother    Colon cancer Mother 43   Cancer Mother 63       colon cancer   Hyperlipidemia Mother    Obesity Mother    Sudden death Mother    Arthritis Mother    Colon cancer Paternal Grandmother    Cancer Paternal Grandmother        colon cancer   Stroke Maternal Grandmother    Colon cancer Maternal Grandmother    Colon polyps Brother        x 2 brother   Diabetes Daughter    Hypertension Daughter    Sleep apnea Daughter    Mental illness Daughter  depression   Migraines Daughter    Cirrhosis Brother        alcoholism   Alcohol abuse Brother    Migraines Daughter    Mental illness Daughter        anxiety attacks   Protein S deficiency Daughter    Sleep apnea Brother    Hyperlipidemia Brother    Migraines Brother    Obstructive Sleep Apnea Brother    Colon cancer Paternal Aunt     Social History Social History   Tobacco Use   Smoking status: Former    Types:  Cigarettes   Smokeless tobacco: Never   Tobacco comments:    smoked occasionally longest 6 mos.  Vaping Use   Vaping Use: Never used  Substance Use Topics   Alcohol use: No    Alcohol/week: 1.0 standard drink    Types: 1 Glasses of wine per week   Drug use: No     Allergies   Fish oil   Review of Systems Review of Systems  Constitutional:  Negative for appetite change, chills and fever.  HENT:  Positive for congestion and sore throat. Negative for ear pain, rhinorrhea, sinus pressure and sinus pain.   Eyes:  Negative for redness and visual disturbance.  Respiratory:  Positive for cough. Negative for chest tightness, shortness of breath and wheezing.   Cardiovascular:  Negative for chest pain and palpitations.  Gastrointestinal:  Negative for abdominal pain, constipation, diarrhea, nausea and vomiting.  Genitourinary:  Negative for dysuria, frequency and urgency.  Musculoskeletal:  Negative for myalgias.  Neurological:  Negative for dizziness, weakness and headaches.  Psychiatric/Behavioral:  Negative for confusion.   All other systems reviewed and are negative.   Physical Exam Triage Vital Signs ED Triage Vitals [10/08/21 0823]  Enc Vitals Group     BP (!) 113/50     Pulse Rate 68     Resp 16     Temp 98.9 F (37.2 C)     Temp Source Oral     SpO2 96 %     Weight      Height      Head Circumference      Peak Flow      Pain Score 4     Pain Loc      Pain Edu?      Excl. in [place]?    No data found.  Updated Vital Signs BP 118/70 (BP Location: Left Arm)   Pulse 68   Temp 98.9 F (37.2 C) (Oral)   Resp 16   LMP 11/11/2010   SpO2 96%   Visual Acuity Right Eye Distance:   Left Eye Distance:   Bilateral Distance:    Right Eye Near:   Left Eye Near:    Bilateral Near:     Physical Exam Vitals reviewed.  Constitutional:      General: She is not in acute distress.    Appearance: Normal appearance. She is not ill-appearing.  HENT:     Head:  Normocephalic and atraumatic.     Right Ear: Tympanic membrane, ear canal and external ear normal. No tenderness. No middle ear effusion. There is no impacted cerumen. Tympanic membrane is not perforated, erythematous, retracted or bulging.     Left Ear: Tympanic membrane, ear canal and external ear normal. No tenderness.  No middle ear effusion. There is no impacted cerumen. Tympanic membrane is not perforated, erythematous, retracted or bulging.     Nose: Nose normal. No congestion.  Mouth/Throat:     Mouth: Mucous membranes are moist.     Pharynx: Uvula midline. No oropharyngeal exudate or posterior oropharyngeal erythema.  Eyes:     Extraocular Movements: Extraocular movements intact.     Pupils: Pupils are equal, round, and reactive to light.  Cardiovascular:     Rate and Rhythm: Normal rate and regular rhythm.     Heart sounds: Normal heart sounds.  Pulmonary:     Effort: Pulmonary effort is normal.     Breath sounds: Normal breath sounds. No decreased breath sounds, wheezing, rhonchi or rales.  Abdominal:     Palpations: Abdomen is soft.     Tenderness: There is no abdominal tenderness. There is no guarding or rebound.  Lymphadenopathy:     Cervical: No cervical adenopathy.     Right cervical: No superficial cervical adenopathy.    Left cervical: No superficial cervical adenopathy.  Neurological:     General: No focal deficit present.     Mental Status: She is alert and oriented to person, place, and time.  Psychiatric:        Mood and Affect: Mood normal.        Behavior: Behavior normal.        Thought Content: Thought content normal.        Judgment: Judgment normal.     UC Treatments / Results  Labs (all labs ordered are listed, but only abnormal results are displayed) Labs Reviewed  CULTURE, GROUP A STREP  [facility])  POCT RAPID STREP A, ED / UC    EKG   Radiology No results found.  Procedures Procedures (including critical care time)  Medications Ordered  in UC Medications - No data to display  Initial Impression / Assessment and Plan / UC Course  I have reviewed the triage vital signs and the nursing notes.  Pertinent labs & imaging results that were available during my care of the patient were reviewed by me and considered in my medical decision making (see chart for details).     This patient is a very pleasant 60 y.o. year old female presenting with suspected influenza (resolving on its own) and exposure to strep 2 weeks ago. Today this pt is afebrile nontachycardic nontachypneic, oxygenating well on room air, no wheezes rhonchi or rales.   Rapid strep negative, culture sent. Given duration of symptoms, did not check an influenza or covid test.   Low-dose Prednisone as below.  Continue albuterol prn, she has not required this during present illness.   ED return precautions discussed. Patient verbalizes understanding and agreement.    Final Clinical Impressions(s) / UC Diagnoses   Final diagnoses:  Suspected novel influenza A virus infection  Exposure to strep throat  Screening for streptococcal infection     Discharge Instructions      -Your strep test was negative. -Prednisone one pill daily x5 days. Take with food in the morning.  -Albuterol inhaler as needed for cough, wheezing, shortness of breath, 1 to 2 puffs every 6 hours as needed. -With a virus, you're typically contagious for 5-7 days, or as long as you're having fevers.        ED Prescriptions     Medication Sig Dispense Auth. Provider   predniSONE (DELTASONE) 20 MG tablet Take 1 tablet (20 mg total) by mouth daily for 5 days. 5 tablet Hazel Sams, PA-C      PDMP not reviewed this encounter.   Hazel Sams, PA-C 10/08/21 [phone]

## 2021-10-08 NOTE — Discharge Instructions (Addendum)
-  Your strep test was negative. -Prednisone one pill daily x5 days. Take with food in the morning.  -Albuterol inhaler as needed for cough, wheezing, shortness of breath, 1 to 2 puffs every 6 hours as needed. -With a virus, you're typically contagious for 5-7 days, or as long as you're having fevers.

## 2021-10-08 NOTE — ED Triage Notes (Signed)
Pt presents with a cough, fever, headache and sore throat x 6 days. Pt states she she has tried Mucinex and Tylenol for relief.

## 2021-10-10 LAB — CULTURE, GROUP A STREP (THRC)

## 2021-10-25 ENCOUNTER — Other Ambulatory Visit (HOSPITAL_COMMUNITY): Payer: Self-pay

## 2021-11-14 ENCOUNTER — Encounter: Payer: Self-pay | Admitting: Specialist

## 2021-11-15 ENCOUNTER — Other Ambulatory Visit: Payer: Self-pay

## 2021-11-15 ENCOUNTER — Ambulatory Visit: Payer: 59 | Admitting: Orthopaedic Surgery

## 2021-11-15 ENCOUNTER — Ambulatory Visit (INDEPENDENT_AMBULATORY_CARE_PROVIDER_SITE_OTHER): Payer: 59

## 2021-11-15 ENCOUNTER — Encounter: Payer: Self-pay | Admitting: Orthopaedic Surgery

## 2021-11-15 DIAGNOSIS — Z96652 Presence of left artificial knee joint: Secondary | ICD-10-CM

## 2021-11-15 NOTE — Progress Notes (Signed)
The patient is 60-monthstatus post a left total knee arthroplasty.  She had a left knee has done well but she did fall on her knees Monday and she has had some knee swelling since then but is not hurting severely.  It has been hurting more since she fell.  She is otherwise been doing well.  We performed press-fit knee replacement surgery on her in July of this past year.  Examination of her left knee does show move smoothly and fluidly.  There is a little bit of swelling but no redness.  The knee feels ligamentously stable.  2 views left knee show no acute changes.  There is no evidence of fracture.  The replacement itself appears to be well-seated.  I would like to see her back 1 more time but not for 6 months.  That will be the 1 year standpoint from surgery.  We will have an AP and lateral of her left knee at that visit.

## 2021-11-16 ENCOUNTER — Encounter: Payer: Self-pay | Admitting: Radiology

## 2021-11-19 ENCOUNTER — Telehealth: Payer: Self-pay | Admitting: Orthopaedic Surgery

## 2021-11-19 NOTE — Telephone Encounter (Signed)
Received medical records release form, $25.00 money order and FMLA/Disability paperwork from patient. Forwarding to [place] today.

## 2021-12-04 DIAGNOSIS — G4710 Hypersomnia, unspecified: Secondary | ICD-10-CM | POA: Diagnosis not present

## 2021-12-04 DIAGNOSIS — E119 Type 2 diabetes mellitus without complications: Secondary | ICD-10-CM | POA: Diagnosis not present

## 2021-12-04 DIAGNOSIS — F419 Anxiety disorder, unspecified: Secondary | ICD-10-CM | POA: Diagnosis not present

## 2021-12-04 DIAGNOSIS — M797 Fibromyalgia: Secondary | ICD-10-CM | POA: Diagnosis not present

## 2021-12-06 ENCOUNTER — Other Ambulatory Visit (HOSPITAL_COMMUNITY): Payer: Self-pay

## 2021-12-10 ENCOUNTER — Other Ambulatory Visit: Payer: Self-pay

## 2021-12-10 ENCOUNTER — Other Ambulatory Visit: Payer: 59 | Admitting: *Deleted

## 2021-12-10 DIAGNOSIS — G4710 Hypersomnia, unspecified: Secondary | ICD-10-CM

## 2021-12-10 DIAGNOSIS — Z9884 Bariatric surgery status: Secondary | ICD-10-CM | POA: Diagnosis not present

## 2021-12-10 DIAGNOSIS — E119 Type 2 diabetes mellitus without complications: Secondary | ICD-10-CM

## 2021-12-10 NOTE — Progress Notes (Signed)
Lab orders received by PCP.

## 2021-12-12 LAB — CBC
Hematocrit: 44.8 % (ref 34.0–46.6)
Hemoglobin: 14.8 g/dL (ref 11.1–15.9)
MCH: 28.8 pg (ref 26.6–33.0)
MCHC: 33 g/dL (ref 31.5–35.7)
MCV: 87 fL (ref 79–97)
Platelets: 322 10*3/uL (ref 150–450)
RBC: 5.14 x10E6/uL (ref 3.77–5.28)
RDW: 13.3 % (ref 11.7–15.4)
WBC: 8.5 10*3/uL (ref 3.4–10.8)

## 2021-12-12 LAB — COMPREHENSIVE METABOLIC PANEL
ALT: 54 IU/L — ABNORMAL HIGH (ref 0–32)
AST: 33 IU/L (ref 0–40)
Albumin/Globulin Ratio: 1.5 (ref 1.2–2.2)
Albumin: 4.1 g/dL (ref 3.8–4.9)
Alkaline Phosphatase: 111 IU/L (ref 44–121)
BUN/Creatinine Ratio: 14 (ref 9–23)
BUN: 12 mg/dL (ref 6–24)
Bilirubin Total: 0.6 mg/dL (ref 0.0–1.2)
CO2: 23 mmol/L (ref 20–29)
Calcium: 9.1 mg/dL (ref 8.7–10.2)
Chloride: 107 mmol/L — ABNORMAL HIGH (ref 96–106)
Creatinine, Ser: 0.84 mg/dL (ref 0.57–1.00)
Globulin, Total: 2.7 g/dL (ref 1.5–4.5)
Glucose: 136 mg/dL — ABNORMAL HIGH (ref 70–99)
Potassium: 4.2 mmol/L (ref 3.5–5.2)
Sodium: 144 mmol/L (ref 134–144)
Total Protein: 6.8 g/dL (ref 6.0–8.5)
eGFR: 80 mL/min/{1.73_m2} (ref >59)

## 2021-12-12 LAB — IRON,TIBC AND FERRITIN PANEL
Ferritin: 44 ng/mL (ref 15–150)
Iron Saturation: 23 % (ref 15–55)
Iron: 79 ug/dL (ref 27–159)
Total Iron Binding Capacity: 338 ug/dL (ref 250–450)
UIBC: 259 ug/dL (ref 131–425)

## 2021-12-12 LAB — FOLATE: Folate: >20 ng/mL (ref >3.0)

## 2021-12-12 LAB — VITAMIN B12: Vitamin B-12: 510 pg/mL (ref 232–1245)

## 2021-12-12 LAB — VITAMIN D 25 HYDROXY (VIT D DEFICIENCY, FRACTURES): Vit D, 25-Hydroxy: 36.8 ng/mL (ref 30.0–100.0)

## 2021-12-13 ENCOUNTER — Telehealth: Payer: 59 | Admitting: Family Medicine

## 2021-12-13 ENCOUNTER — Other Ambulatory Visit (HOSPITAL_COMMUNITY): Payer: Self-pay

## 2021-12-13 DIAGNOSIS — J069 Acute upper respiratory infection, unspecified: Secondary | ICD-10-CM | POA: Diagnosis not present

## 2021-12-13 MED ORDER — BENZONATATE 100 MG PO CAPS
100.0000 mg | ORAL_CAPSULE | Freq: Two times a day (BID) | ORAL | 0 refills | Status: DC | PRN
Start: 2021-12-13 — End: 2022-08-14
  Filled 2021-12-13: qty 20, 10d supply, fill #0

## 2021-12-13 MED ORDER — PROMETHAZINE-DM 6.25-15 MG/5ML PO SYRP
2.5000 mL | ORAL_SOLUTION | Freq: Three times a day (TID) | ORAL | 0 refills | Status: DC | PRN
  Filled 2021-12-13: qty 118, 16d supply, fill #0

## 2021-12-13 NOTE — Progress Notes (Signed)
E-Visit for Upper Respiratory Infection   We are sorry you are not feeling well.  Here is how we plan to help!  Based on what you have shared with me, it looks like you may have a viral upper respiratory infection.  Upper respiratory infections are caused by a large number of viruses; however, rhinovirus is the most common cause.   Symptoms vary from person to person, with common symptoms including sore throat, cough, fatigue or lack of energy and feeling of general discomfort.  A low-grade fever of up to 100.4 may present, but is often uncommon.  Symptoms vary however, and are closely related to a person's age or underlying illnesses.  The most common symptoms associated with an upper respiratory infection are nasal discharge or congestion, cough, sneezing, headache and pressure in the ears and face.  These symptoms usually persist for about 3 to 10 days, but can last up to 2 weeks.  It is important to know that upper respiratory infections do not cause serious illness or complications in most cases.    Upper respiratory infections can be transmitted from person to person, with the most common method of transmission being a person's hands.  The virus is able to live on the skin and can infect other persons for up to 2 hours after direct contact.  Also, these can be transmitted when someone coughs or sneezes; thus, it is important to cover the mouth to reduce this risk.  To keep the spread of the illness at [place], good hand hygiene is very important.  This is an infection that is most likely caused by a virus. There are no specific treatments other than to help you with the symptoms until the infection runs its course.  We are sorry you are not feeling well.  Here is how we plan to help!   For nasal congestion, you may use an oral decongestants such as Mucinex D or if you have glaucoma or high blood pressure use plain Mucinex.  Saline nasal spray or nasal drops can help and can safely be used as often as  needed for congestion.  For your congestion, I have prescribed Fluticasone nasal spray one spray in each nostril twice a day (you have this already, please continue to use this)  If you do not have a history of heart disease, hypertension, diabetes or thyroid disease, prostate/bladder issues or glaucoma, you may also use Sudafed to treat nasal congestion.  It is highly recommended that you consult with a pharmacist or your primary care physician to ensure this medication is safe for you to take.     If you have a cough, you may use cough suppressants such as Delsym and Robitussin.  If you have glaucoma or high blood pressure, you can also use Coricidin HBP.   For cough I have prescribed for you A prescription cough medication called Tessalon Perles 100 mg. You may take 1-2 capsules every 8 hours as needed for cough And Promethazine DM cough syrup.  If you have a sore or scratchy throat, use a saltwater gargle-  to  teaspoon of salt dissolved in a 4-ounce to 8-ounce glass of warm water.  Gargle the solution for approximately 15-30 seconds and then spit.  It is important not to swallow the solution.  You can also use throat lozenges/cough drops and Chloraseptic spray to help with throat pain or discomfort.  Warm or cold liquids can also be helpful in relieving throat pain.  For headache, pain or general  discomfort, you can use Ibuprofen or Tylenol as directed.   Some authorities believe that zinc sprays or the use of Echinacea may shorten the course of your symptoms.   HOME CARE Only take medications as instructed by your medical team. Be sure to drink plenty of fluids. Water is fine as well as fruit juices, sodas and electrolyte beverages. You may want to stay away from caffeine or alcohol. If you are nauseated, try taking small sips of liquids. How do you know if you are getting enough fluid? Your urine should be a pale yellow or almost colorless. Get rest. Taking a steamy shower or using a  humidifier may help nasal congestion and ease sore throat pain. You can place a towel over your head and breathe in the steam from hot water coming from a faucet. Using a saline nasal spray works much the same way. Cough drops, hard candies and sore throat lozenges may ease your cough. Avoid close contacts especially the very young and the elderly Cover your mouth if you cough or sneeze Always remember to wash your hands.   GET HELP RIGHT AWAY IF: You develop worsening fever. If your symptoms do not improve within 10 days You develop yellow or green discharge from your nose over 3 days. You have coughing fits You develop a severe head ache or visual changes. You develop shortness of breath, difficulty breathing or start having chest pain Your symptoms persist after you have completed your treatment plan  MAKE SURE YOU  Understand these instructions. Will watch your condition. Will get help right away if you are not doing well or get worse.  Thank you for choosing an e-visit.  Your e-visit answers were reviewed by a board certified advanced clinical practitioner to complete your personal care plan. Depending upon the condition, your plan could have included both over the counter or prescription medications.  Please review your pharmacy choice. Make sure the pharmacy is open so you can pick up prescription now. If there is a problem, you may contact your provider through [employer] and have the prescription routed to another pharmacy.  Your safety is important to [name]. If you have drug allergies check your prescription carefully.   For the next 24 hours you can use MyChart to ask questions about today's visit, request a non-urgent call back, or ask for a work or school excuse. You will get an email in the next two days asking about your experience. I hope that your e-visit has been valuable and will speed your recovery.    I provided 5 minutes of non face-to-face time during this  encounter for chart review, medication and order placement, as well as and documentation.

## 2021-12-17 ENCOUNTER — Other Ambulatory Visit (HOSPITAL_COMMUNITY): Payer: Self-pay

## 2021-12-18 DIAGNOSIS — H0102B Squamous blepharitis left eye, upper and lower eyelids: Secondary | ICD-10-CM | POA: Diagnosis not present

## 2021-12-18 DIAGNOSIS — H0102A Squamous blepharitis right eye, upper and lower eyelids: Secondary | ICD-10-CM | POA: Diagnosis not present

## 2021-12-18 DIAGNOSIS — E119 Type 2 diabetes mellitus without complications: Secondary | ICD-10-CM | POA: Diagnosis not present

## 2021-12-18 DIAGNOSIS — H538 Other visual disturbances: Secondary | ICD-10-CM | POA: Diagnosis not present

## 2021-12-25 ENCOUNTER — Telehealth: Payer: Self-pay | Admitting: *Deleted

## 2021-12-25 NOTE — Telephone Encounter (Signed)
Former Dr. Acie Fredrickson  pt.  Last seen back in 2021. Pt needing to establish with a new Cardiologist, for her current one is in slow down, heading towards retirement.  Pt will establish care with Dr. Johney Frame on 01/24/22 at 0930.  Pt aware of appt date and time. Pt agreed to plan.

## 2022-01-01 ENCOUNTER — Other Ambulatory Visit (HOSPITAL_COMMUNITY): Payer: Self-pay

## 2022-01-07 ENCOUNTER — Other Ambulatory Visit (HOSPITAL_COMMUNITY): Payer: Self-pay

## 2022-01-10 ENCOUNTER — Other Ambulatory Visit (HOSPITAL_COMMUNITY): Payer: Self-pay

## 2022-01-17 NOTE — Progress Notes (Unsigned)
Cardiology Office Note:    Date:  01/17/2022   ID:  Cristina [name], DOB 21-Oct-1961, MRN [id number]  PCP:  [name], MD   [facility] Providers Cardiologist:  None { Click to update primary MD,subspecialty MD or APP then REFRESH:1}    Referring MD: [name], MD   No chief complaint on file. ***  History of Present Illness:    Cristina [name] is a 61 y.o. female with a hx of ***  Past Medical History:  Diagnosis Date   Allergic rhinitis, cause unspecified    Anemia    Arthritis    Asthma    Back pain    Chest pain    Constipation    CTS (carpal tunnel syndrome)    Cystocele    Depression    Diabetes mellitus    Dysfunction of eustachian tube    Fatty liver    Fibromyalgia    Fissure, anal    Gallbladder problem    Genital herpes    GERD (gastroesophageal reflux disease)    Headache    Hemorrhoid    Hx of migraine headaches    Hyperlipidemia    IBS (irritable bowel syndrome)    Insomnia    Iron deficiency anemia, unspecified    Joint pain    Nausea    Obesity    OSA on CPAP    Osteoarthritis    Rectocele    Sleep apnea    TMJ (dislocation of temporomandibular joint)     Past Surgical History:  Procedure Laterality Date   ABDOMINAL HYSTERECTOMY  11/11/2010   [place] Plan.  Ovaries intact.  Uterine fibroids with DUB.   CARPAL TUNNEL RELEASE  11/12/1987   Left   CHOLECYSTECTOMY     CHOLECYSTECTOMY     COLONOSCOPY  06/29/2012   normal.  Repeat in 5 years.   DILATION AND CURETTAGE OF UTERUS     ECTOPIC PREGNANCY SURGERY  11/12/1991   ESOPHAGOGASTRODUODENOSCOPY  12/13/2011   dysphagia.  Henrene Pastor.  Normal.   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  CPAP titration at 12 cm water pressure.   TOTAL KNEE ARTHROPLASTY Left 05/31/2021   Procedure: LEFT TOTAL  KNEE ARTHROPLASTY;  Surgeon: Mcarthur Rossetti, MD;  Location: [place];  Service: Orthopedics;  Laterality: Left;   TUBAL LIGATION  11/11/1986    Current Medications: No outpatient medications have been marked as taking for the 01/24/22 encounter (Appointment) with Freada Bergeron, MD.     Allergies:   Fish oil   Social History   Socioeconomic History   Marital status: Single    Spouse name: n/a   Number of children: 3   Years of education: college   Highest education level: Not on file  Occupational History   Occupation: Audiological scientist: [employer]    Comment: cma/[employer]  Tobacco Use   Smoking status: Former    Types: Cigarettes   Smokeless tobacco: Never   Tobacco comments:    smoked occasionally longest 6 mos.  Vaping Use   Vaping Use: Never used  Substance and Sexual Activity   Alcohol use: No    Alcohol/week: 1.0 standard drink    Types: 1 Glasses of wine per week   Drug use: No   Sexual  activity: Not Currently    Birth control/protection: None  Other Topics Concern   Not on file  Social History Narrative   Marital status:  Divorced in 2000 after six years of marriage; not dating in 2017.      Children:  3 daughters (38, 47, 57); 6 grandchildren.      Lives: alone.  2 dogs; grandson stays some.      Employment:  Working at [employer]. 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      Tobacco: none      Alcohol: socially; special occasions.  Rare.      Drugs: none      Exercise:  none      Sexual activity:  Not sexually active since 2011.       Guns:  No guns in the home.       Smoke detectors in use,       Seatbelt:  Sometimes uses seat belts. 75% of time.  No texting while driving.     Social Determinants of Health   Financial Resource Strain: Not on file  Food Insecurity: Not on file  Transportation Needs: Not on file  Physical Activity: Not on file  Stress: Not on file  Social Connections: Not on file     Family  History: The patient's ***family history includes Alcohol abuse in her brother; Arthritis in her mother; Cancer in her paternal grandmother; Cancer (age of onset: 15) in her mother; Cirrhosis in her brother; Colon cancer in her maternal grandmother, paternal aunt, and paternal grandmother; Colon cancer (age of onset: 53) in her mother; Colon polyps in her brother; Diabetes in her daughter and mother; Hyperlipidemia in her brother and mother; Hypertension in her daughter; Hypertension (age of onset: 68) in her mother; Mental illness in her daughter and daughter; Migraines in her brother, daughter, and daughter; Obesity in her mother; Obstructive Sleep Apnea in her brother; Osteoarthritis in her mother; Protein S deficiency in her daughter; Sleep apnea in her brother and daughter; Stroke in her maternal grandmother; Sudden death in her mother.  ROS:   Please see the history of present illness.    *** All other systems reviewed and are negative.  EKGs/Labs/Other Studies Reviewed:    The following studies were reviewed today: ***  EKG:  EKG is *** ordered today.  The ekg ordered today demonstrates ***  Recent Labs: 09/05/2021: TSH 3.740 12/10/2021: ALT 54; BUN 12; Creatinine, Ser 0.84; Hemoglobin 14.8; Platelets 322; Potassium 4.2; Sodium 144  Recent Lipid Panel    Component Value Date/Time   CHOL 151 09/05/2021 0714   TRIG 167 (H) 09/05/2021 0714   HDL 50 09/05/2021 0714   CHOLHDL 3.0 09/05/2021 0714   CHOLHDL 3.7 08/27/2016 1157   VLDL 23 08/27/2016 1157   LDLCALC 73 09/05/2021 0714   LDLDIRECT 73 09/05/2021 0714     Risk Assessment/Calculations:   {Does this patient have ATRIAL FIBRILLATION?:[id number]}       Physical Exam:    VS:  LMP 11/11/2010     Wt Readings from Last 3 Encounters:  07/19/21 178 lb (80.7 kg)  05/31/21 178 lb (80.7 kg)  05/28/21 180 lb 12.8 oz (82 kg)     GEN: *** Well nourished, well developed in no acute distress HEENT: Normal NECK: No JVD; No  carotid bruits LYMPHATICS: No lymphadenopathy CARDIAC: ***RRR, no murmurs, rubs, gallops RESPIRATORY:  Clear to auscultation without rales, wheezing or rhonchi  ABDOMEN: Soft, non-tender, non-distended MUSCULOSKELETAL:  No edema; No deformity  SKIN:  Warm and dry NEUROLOGIC:  Alert and oriented x 3 PSYCHIATRIC:  Normal affect   ASSESSMENT:    No diagnosis found. PLAN:    In order of problems listed above:  ***      {Are you ordering a CV Procedure (e.g. stress test, cath, DCCV, TEE, etc)?   Press F2        :[id number]}    Medication Adjustments/Labs and Tests Ordered: Current medicines are reviewed at length with the patient today.  Concerns regarding medicines are outlined above.  No orders of the defined types were placed in this encounter.  No orders of the defined types were placed in this encounter.   There are no Patient Instructions on file for this visit.   Signed, Freada Bergeron, MD  01/17/2022 3:36 PM    [place]

## 2022-01-24 ENCOUNTER — Other Ambulatory Visit: Payer: Self-pay

## 2022-01-24 ENCOUNTER — Telehealth: Payer: Self-pay | Admitting: Pharmacist

## 2022-01-24 ENCOUNTER — Ambulatory Visit (INDEPENDENT_AMBULATORY_CARE_PROVIDER_SITE_OTHER): Payer: 59 | Admitting: Cardiology

## 2022-01-24 ENCOUNTER — Encounter: Payer: Self-pay | Admitting: Cardiology

## 2022-01-24 VITALS — BP 112/76 | HR 50 | Ht 61.0 in | Wt 179.0 lb

## 2022-01-24 DIAGNOSIS — Z9884 Bariatric surgery status: Secondary | ICD-10-CM | POA: Diagnosis not present

## 2022-01-24 DIAGNOSIS — E785 Hyperlipidemia, unspecified: Secondary | ICD-10-CM | POA: Diagnosis not present

## 2022-01-24 DIAGNOSIS — E7800 Pure hypercholesterolemia, unspecified: Secondary | ICD-10-CM | POA: Diagnosis not present

## 2022-01-24 DIAGNOSIS — E1169 Type 2 diabetes mellitus with other specified complication: Secondary | ICD-10-CM | POA: Diagnosis not present

## 2022-01-24 DIAGNOSIS — I10 Essential (primary) hypertension: Secondary | ICD-10-CM | POA: Diagnosis not present

## 2022-01-24 DIAGNOSIS — E669 Obesity, unspecified: Secondary | ICD-10-CM | POA: Diagnosis not present

## 2022-01-24 NOTE — Patient Instructions (Signed)
Medication Instructions:  ? ?Your physician recommends that you continue on your current medications as directed. Please refer to the Current Medication list given to you today. ? ?*If you need a refill on your cardiac medications before your next appointment, please call your pharmacy* ? ? ? ?Testing/Procedures: ? ?CARDIAC CALCIUM SCORE (SELF PAY) DONE HERE IN THE OFFICE ? ? ?Follow-Up: ?At [facility], you and your health needs are our priority.  As part of our continuing mission to provide you with exceptional heart care, we have created designated Provider Care Teams.  These Care Teams include your primary Cardiologist (physician) and Advanced Practice Providers (APPs -  Physician Assistants and Nurse Practitioners) who all work together to provide you with the care you need, when you need it. ? ?We recommend signing up for the patient portal called "MyChart".  Sign up information is provided on this After Visit Summary.  MyChart is used to connect with patients for Virtual Visits (Telemedicine).  Patients are able to view lab/test results, encounter notes, upcoming appointments, etc.  Non-urgent messages can be sent to your provider as well.   ?To learn more about what you can do with MyChart, go to [email or web address].   ? ?Your next appointment:   ?1 year(s) ? ?The format for your next appointment:   ?In Person ? ?Provider:   ?DR. PEMBERTON  ? ?

## 2022-01-24 NOTE — Telephone Encounter (Signed)
Pt referred by Dr Johney Frame to initiate [id number] therapy for weight loss. Prior authorization for Mancel Parsons has been submitted. Will follow up with pt once determination has been made. ?

## 2022-01-24 NOTE — Progress Notes (Signed)
?Cardiology Office Note:   ? ?Date:  01/24/2022  ? ?ID:  Cristina [name], DOB 1961-08-25, MRN [id number] ? ?PCP:  [name], MD ?  ?[facility] Providers ?Cardiologist:  None { ? ?Referring MD: [name], MD  ? ? ?History of Present Illness:   ? ?Cristina [name] is a 61 y.o. female with a hx of HTN, OSA on CPAP, DMII, asthma, HLD, and fibromyalgia who presents to clinic for follow-up. ? ?Patient was previously followed by Dr. Acie Fredrickson, Lovena Le and [place]. She had myoview in 2018 which was negative for ischemia. TTE in 2017 with normal LVEF, G1DD, no significant valve disease. Last saw Dr. Harrington Challenger in 05/2020 for chest pain that had been ongoing for 3 days. Deemed likely to be MSK in nature.  ? ?Today, the patient states that she is feeling okay overall. Her main concern today is her weight. She would also like to discuss a calcium score. ? ?She has struggled with weight loss. She has tried diet and exercise. [facility] she had little to no success with weight loss. About 4-5 years ago she had a gastric bypass and lost 65 lbs. She has not regained weight, but she seems to have plateaued.  ? ?She denies any palpitations, chest pain, shortness of breath, or peripheral edema. No lightheadedness, headaches, syncope, orthopnea, or PND. ? ?Her PCP follows her cholesterol management. Most recent LDL 73. ? ?At this time she continues to avoid smoking. ? ?Her aspirin was discontinued due to her gastric bypass. ? ?In her family, her mother died from cancer. No known cardiac disease. ? ?Past Medical History:  ?Diagnosis Date  ? Allergic rhinitis, cause unspecified   ? Anemia   ? Arthritis   ? Asthma   ? Back pain   ? Chest pain   ? Constipation   ? CTS (carpal tunnel syndrome)   ? Cystocele   ? Depression   ? Diabetes mellitus   ? Dysfunction of eustachian tube   ? Fatty liver   ? Fibromyalgia   ? Fissure, anal   ? Gallbladder problem   ? Genital herpes   ? GERD (gastroesophageal reflux disease)    ? Headache   ? Hemorrhoid   ? Hx of migraine headaches   ? Hyperlipidemia   ? IBS (irritable bowel syndrome)   ? Insomnia   ? Iron deficiency anemia, unspecified   ? Joint pain   ? Nausea   ? Obesity   ? OSA on CPAP   ? Osteoarthritis   ? Rectocele   ? Sleep apnea   ? TMJ (dislocation of temporomandibular joint)   ? ? ?Past Surgical History:  ?Procedure Laterality Date  ? ABDOMINAL HYSTERECTOMY  11/11/2010  ? Marvel Plan.  Ovaries intact.  Uterine fibroids with DUB.  ? CARPAL TUNNEL RELEASE  11/12/1987  ? Left  ? CHOLECYSTECTOMY    ? CHOLECYSTECTOMY    ? COLONOSCOPY  06/29/2012  ? normal.  Repeat in 5 years.  ? DILATION AND CURETTAGE OF UTERUS    ? ECTOPIC PREGNANCY SURGERY  11/12/1991  ? ESOPHAGOGASTRODUODENOSCOPY  12/13/2011  ? dysphagia.  Henrene Pastor.  Normal.  ? GASTRIC ROUX-EN-Y N/A 07/06/2018  ? Procedure: LAPAROSCOPIC ROUX-EN-Y GASTRIC BYPASS WITH UPPER ENDOSCOPY;  Surgeon: Excell Seltzer, MD;  [facility];  Service: General;  Laterality: N/A;  ? Sleep study  09/11/2012  ? severe OSA.  CPAP titration at 12 cm water pressure.  ? TOTAL KNEE ARTHROPLASTY Left 05/31/2021  ?  Procedure: LEFT TOTAL KNEE ARTHROPLASTY;  Surgeon: Mcarthur Rossetti, MD;  Location: [place];  Service: Orthopedics;  Laterality: Left;  ? TUBAL LIGATION  11/11/1986  ? ? ?Current Medications: ?Current Meds  ?Medication Sig  ? acyclovir (ZOVIRAX) 400 MG tablet Take 1 tablet (400 mg total) by mouth 2 (two) times daily.  ? albuterol (VENTOLIN HFA) 108 (90 Base) MCG/ACT inhaler Inhale 1 puff into the lungs every 6 (six) hours as needed for wheezing or shortness of breath.  ? ANUCORT-HC 25 MG suppository PLACE 1 SUPPOSITORY RECTALLY 2 TIMES DAILY AS NEEDED FOR HEMORRHOIDS. (Patient taking differently: Place 25 mg rectally 2 (two) times daily as needed for hemorrhoids. PLACE 1 SUPPOSITORY RECTALLY 2 TIMES DAILY AS NEEDED FOR HEMORRHOIDS.)  ? aspirin 81 MG chewable tablet Chew 1 tablet (81 mg total) by mouth 2 (two) times daily.  ?  atorvastatin (LIPITOR) 20 MG tablet Take 1 tablet (20 mg total) by mouth daily.  ? azelastine (ASTELIN) 0.1 % nasal spray PLACE 2 SPRAYS INTO BOTH NOSTRILS 2 TIMES DAILY AS DIRECTED (Patient taking differently: Place 2 sprays into both nostrils 2 (two) times daily as needed for rhinitis or allergies. PLACE 2 SPRAYS INTO BOTH NOSTRILS 2 TIMES DAILY AS DIRECTE)  ? azelastine (OPTIVAR) 0.05 % ophthalmic solution Place 1 drop into both eyes 2 (two) times daily as needed. (Patient taking differently: Place 1 drop into both eyes 2 (two) times daily as needed (allergies).)  ? azelastine (OPTIVAR) 0.05 % ophthalmic solution Apply 1 drop to eye as needed.  ? benzonatate (TESSALON) 100 MG capsule Take 1 capsule (100 mg total) by mouth 2 (two) times daily as needed for cough.  ? buPROPion (WELLBUTRIN) 100 MG tablet Take 1 tablet (100 mg total) by mouth daily.  ? calcium carbonate (OS-CAL) 1250 (500 Ca) MG chewable tablet Chew 1 tablet by mouth daily.  ? cetirizine (ZYRTEC) 10 MG tablet Take 1 tablet (10 mg total) by mouth once daily  ? COVID-19 At Home Antigen Test [employer] COVID-19 HOME TEST) KIT Use as directed  ? cyclobenzaprine (FLEXERIL) 10 MG tablet Take 1 tablet (10 mg total) by mouth 3 (three) times daily as needed for muscle spasms.  ? diclofenac Sodium (VOLTAREN) 1 % GEL Apply topically 4 (four) times daily. (Patient taking differently: Apply 4 g topically 4 (four) times daily as needed (pain).)  ? diltiazem 2 % GEL Apply 1 application topically 3 (three) times daily. (Patient taking differently: Apply 1 application. topically 3 (three) times daily as needed (anal fissure).)  ? empagliflozin (JARDIANCE) 25 MG TABS tablet Take 1 tablet (25 mg total) by mouth daily with breakfast.  ? escitalopram (LEXAPRO) 10 MG tablet TAKE 1 TABLET BY MOUTH ONCE DAILY  ? escitalopram (LEXAPRO) 10 MG tablet Take 1 tablet (10 mg total) by mouth once daily  ? esomeprazole (NEXIUM) 40 MG capsule Take 1 capsule (40 mg total) by mouth  daily.  ? fluticasone (FLONASE) 50 MCG/ACT nasal spray Place 2 sprays into both nostrils daily.  ? fluticasone (FLONASE) 50 MCG/ACT nasal spray Place 2 sprays into the nose daily.  ? gabapentin (NEURONTIN) 300 MG capsule Take 1 capsule (300 mg total) by mouth 2 (two) times daily  ? glucose blood test strip accucheck guide strips and fast clix lancets Use as instructed dm 2  ? HYDROcodone-acetaminophen (NORCO/VICODIN) 5-325 MG tablet Take 1 tablet by mouth every 6 (six) hours as needed for moderate pain.  ? hydrocortisone 2.5 % ointment Apply topically 2 (two) times daily. (Patient  taking differently: Apply 1 application. topically 2 (two) times daily as needed (rash).)  ? MELATONIN PO Take 15 mg by mouth at bedtime.  ? metFORMIN (GLUCOPHAGE) 500 MG tablet Take 2 tablets (1,000 mg total) by mouth daily with dinner  ? Multiple Vitamins-Minerals (MULTIVITAMIN WITH MINERALS) tablet Take 1 tablet by mouth daily.  ? nitroGLYCERIN (NITROSTAT) 0.4 MG SL tablet Place 1 tablet (0.4 mg total) under the tongue every 5 (five) minutes as needed for chest pain.  ? oxyCODONE (OXY IR/ROXICODONE) 5 MG immediate release tablet Take 1-2 tablets (5-10 mg total) by mouth every 6 (six) hours as needed for moderate pain (pain score 4-6).  ? pantoprazole (PROTONIX) 40 MG tablet Take 1 tablet (40 mg total) by mouth 2 (two) times daily  ? predniSONE (DELTASONE) 20 MG tablet Take 1 tablet (20 mg total) by mouth daily.  ? promethazine-dextromethorphan (PROMETHAZINE-DM) 6.25-15 MG/5ML syrup Take 2.5 mLs by mouth 3 (three) times daily as needed for cough.  ? TRUEPLUS LANCETS 30G MISC USE TO CHECK BLOOD GLUCOSE DAILY AS DIRECTED  ?  ? ?Allergies:   Fish oil  ? ?Social History  ? ?Socioeconomic History  ? Marital status: Single  ?  Spouse name: n/a  ? Number of children: 3  ? Years of education: college  ? Highest education level: Not on file  ?Occupational History  ? Occupation: NURSE SECRETARY  ?  Employer: [employer]  ?  Comment:  cma/[employer]  ?Tobacco Use  ? Smoking status: Former  ?  Types: Cigarettes  ? Smokeless tobacco: Never  ? Tobacco comments:  ?  smoked occasionally longest 6 mos.  ?Vaping Use  ? Vaping Use: Never used  ?Substa

## 2022-01-28 NOTE — Telephone Encounter (Signed)
Wegovy approved through 04/28/22. ?

## 2022-01-29 ENCOUNTER — Other Ambulatory Visit (HOSPITAL_COMMUNITY): Payer: Self-pay

## 2022-01-29 ENCOUNTER — Telehealth: Payer: Self-pay | Admitting: Pharmacist

## 2022-01-29 MED ORDER — SEMAGLUTIDE-WEIGHT MANAGEMENT 0.25 MG/0.5ML ~~LOC~~ SOAJ
0.2500 mg | SUBCUTANEOUS | 0 refills | Status: DC
  Filled 2022-01-29: qty 2, 28d supply, fill #0

## 2022-01-29 MED ORDER — SEMAGLUTIDE-WEIGHT MANAGEMENT 1 MG/0.5ML ~~LOC~~ SOAJ
1.0000 mg | SUBCUTANEOUS | 0 refills | Status: DC
Start: 2022-03-28 — End: 2022-04-23
  Filled 2022-01-29 – 2022-03-25 (×2): qty 2, 28d supply, fill #0

## 2022-01-29 MED ORDER — SEMAGLUTIDE-WEIGHT MANAGEMENT 2.4 MG/0.75ML ~~LOC~~ SOAJ
2.4000 mg | SUBCUTANEOUS | 11 refills | Status: DC
  Filled 2022-01-29: qty 3, fill #0

## 2022-01-29 MED ORDER — SEMAGLUTIDE-WEIGHT MANAGEMENT 0.5 MG/0.5ML ~~LOC~~ SOAJ
0.5000 mg | SUBCUTANEOUS | 0 refills | Status: DC
Start: 2022-02-27 — End: 2022-03-25
  Filled 2022-01-29 – 2022-02-26 (×2): qty 2, 28d supply, fill #0

## 2022-01-29 MED ORDER — SEMAGLUTIDE-WEIGHT MANAGEMENT 1.7 MG/0.75ML ~~LOC~~ SOAJ
1.7000 mg | SUBCUTANEOUS | 0 refills | Status: DC
  Filled 2022-01-29 – 2022-04-22 (×2): qty 3, 28d supply, fill #0

## 2022-01-29 NOTE — Telephone Encounter (Signed)
Patient presents for [facility] training/education ? ?Breakfast: protein shake, fruit, PB crackers ?Lunch:  ?Dinner: PB cracker, sugar free ice cream,  ?Drink: water, diet caffeine free soda (1 per day), herbal tea (w/ splenda) ? ?Use to ride stationary bike - now nothing since knee surgery ? ?We talked about eating real foods, avoiding processed foods, watching for added sugars ?I encouraged her to make exercise part of her daily routine. Suggested once she is able to add aerobic exercise, start adding some strength training. ? ?Injection technique taught and reviewed side effects ?Link for copay card given. ?Rx sent to pharmacy ?Resources below emailed to her ?Follow up monthly ? ?Good resources: ? ?Eat. Sleep. Move. Breath: The Beginner?s Guide to Living A Healthy Lifestyle ? ?Eat, Drink, and Be Healthy: The [employer] Guide to [employer] (2007 version) ? ?Zoe podcast ? ?[email or web address] ? ?

## 2022-01-30 ENCOUNTER — Ambulatory Visit (INDEPENDENT_AMBULATORY_CARE_PROVIDER_SITE_OTHER)
Admission: RE | Admit: 2022-01-30 | Discharge: 2022-01-30 | Disposition: A | Discharge status: Home / Self Care | Payer: Self-pay | Source: Ambulatory Visit | Attending: Cardiology | Admitting: Cardiology

## 2022-01-30 ENCOUNTER — Other Ambulatory Visit: Payer: Self-pay

## 2022-01-30 DIAGNOSIS — E7800 Pure hypercholesterolemia, unspecified: Secondary | ICD-10-CM

## 2022-01-30 DIAGNOSIS — I10 Essential (primary) hypertension: Secondary | ICD-10-CM

## 2022-01-31 ENCOUNTER — Telehealth: Payer: Self-pay | Admitting: *Deleted

## 2022-01-31 ENCOUNTER — Other Ambulatory Visit (HOSPITAL_COMMUNITY): Payer: Self-pay

## 2022-01-31 MED ORDER — ATORVASTATIN CALCIUM 40 MG PO TABS
40.0000 mg | ORAL_TABLET | Freq: Every day | ORAL | 3 refills | Status: DC
  Filled 2022-01-31: qty 90, 90d supply, fill #0

## 2022-01-31 NOTE — Telephone Encounter (Signed)
-----   Message from Freada Bergeron, MD sent at 01/31/2022 10:44 AM EDT ----- ?Her calcium score is elevated at 334. She is in the 99% for age, gender, race matched controls. We just need to get aggressive with lipid lowering therapy and ensure her LDL cholesterol is <70 (currently 73). We can talk to her about increasing her lipitor to 70m daily. She should continue the aspirin as well. Weight loss will also lower her CV risk. ?

## 2022-01-31 NOTE — Telephone Encounter (Signed)
The patient has been notified of the result and verbalized understanding.  All questions (if any) were answered. Nuala Alpha, LPN 7/78/2423 53:61 AM  ? ?Pt aware to increase lipitor to 40 mg po daily. ?Confirmed the pharmacy of choice with the pt.  ?Pt states she cannot take ASA 81 mg po daily due to history of gastric bypass.  Dr. Johney Frame aware of this.  ?Pt verbalized understanding and agrees with this plan. ? ?

## 2022-02-01 ENCOUNTER — Encounter (HOSPITAL_COMMUNITY): Payer: Self-pay | Admitting: *Deleted

## 2022-02-26 ENCOUNTER — Other Ambulatory Visit (HOSPITAL_COMMUNITY): Payer: Self-pay

## 2022-02-27 ENCOUNTER — Other Ambulatory Visit (HOSPITAL_COMMUNITY): Payer: Self-pay

## 2022-03-04 NOTE — Telephone Encounter (Signed)
Spoke with patient about Wegovy. She is on her 2nd dose of 0.42m. Doing well. No side effects. Decreased hunger. Has not started exercising yet. Encouraged her to do this. ?

## 2022-03-05 ENCOUNTER — Other Ambulatory Visit (HOSPITAL_COMMUNITY): Payer: Self-pay

## 2022-03-05 DIAGNOSIS — A6004 Herpesviral vulvovaginitis: Secondary | ICD-10-CM | POA: Diagnosis not present

## 2022-03-05 DIAGNOSIS — J4521 Mild intermittent asthma with (acute) exacerbation: Secondary | ICD-10-CM | POA: Diagnosis not present

## 2022-03-05 DIAGNOSIS — E119 Type 2 diabetes mellitus without complications: Secondary | ICD-10-CM | POA: Diagnosis not present

## 2022-03-05 DIAGNOSIS — Z9884 Bariatric surgery status: Secondary | ICD-10-CM | POA: Diagnosis not present

## 2022-03-05 DIAGNOSIS — Z1231 Encounter for screening mammogram for malignant neoplasm of breast: Secondary | ICD-10-CM | POA: Diagnosis not present

## 2022-03-05 DIAGNOSIS — J301 Allergic rhinitis due to pollen: Secondary | ICD-10-CM | POA: Diagnosis not present

## 2022-03-05 DIAGNOSIS — E7800 Pure hypercholesterolemia, unspecified: Secondary | ICD-10-CM | POA: Diagnosis not present

## 2022-03-05 DIAGNOSIS — G4710 Hypersomnia, unspecified: Secondary | ICD-10-CM | POA: Diagnosis not present

## 2022-03-05 DIAGNOSIS — F419 Anxiety disorder, unspecified: Secondary | ICD-10-CM | POA: Diagnosis not present

## 2022-03-05 MED ORDER — FLUTICASONE PROPIONATE 50 MCG/ACT NA SUSP
2.0000 | Freq: Every day | NASAL | 4 refills | Status: DC
  Filled 2022-03-05: qty 48, 90d supply, fill #0

## 2022-03-05 MED ORDER — ACYCLOVIR 400 MG PO TABS
400.0000 mg | ORAL_TABLET | Freq: Two times a day (BID) | ORAL | 4 refills | Status: DC
  Filled 2022-03-05: qty 180, 90d supply, fill #0
  Filled 2022-05-31: qty 180, 90d supply, fill #1
  Filled 2022-10-23: qty 180, 90d supply, fill #2
  Filled 2023-01-16: qty 180, 90d supply, fill #3

## 2022-03-05 MED ORDER — BUPROPION HCL 100 MG PO TABS
100.0000 mg | ORAL_TABLET | Freq: Two times a day (BID) | ORAL | 4 refills | Status: DC
  Filled 2022-03-05 (×2): qty 180, 90d supply, fill #0
  Filled 2022-05-31: qty 180, 90d supply, fill #1
  Filled 2022-12-25: qty 180, 90d supply, fill #2

## 2022-03-05 MED ORDER — ESCITALOPRAM OXALATE 10 MG PO TABS
10.0000 mg | ORAL_TABLET | Freq: Every day | ORAL | 4 refills | Status: DC
  Filled 2022-03-05: qty 90, 90d supply, fill #0
  Filled 2022-05-31: qty 90, 90d supply, fill #1

## 2022-03-05 MED ORDER — AZELASTINE HCL 0.05 % OP SOLN
1.0000 [drp] | Freq: Two times a day (BID) | OPHTHALMIC | 4 refills | Status: AC | PRN
  Filled 2022-03-05: qty 18, 90d supply, fill #0

## 2022-03-05 MED ORDER — METFORMIN HCL 500 MG PO TABS
1000.0000 mg | ORAL_TABLET | Freq: Every day | ORAL | 4 refills | Status: DC
  Filled 2022-03-05: qty 180, 90d supply, fill #0

## 2022-03-05 MED ORDER — CETIRIZINE HCL 10 MG PO TABS
10.0000 mg | ORAL_TABLET | Freq: Every day | ORAL | 4 refills | Status: DC
Start: 2022-03-05 — End: 2022-06-20

## 2022-03-05 MED ORDER — ATORVASTATIN CALCIUM 20 MG PO TABS
20.0000 mg | ORAL_TABLET | Freq: Every day | ORAL | 4 refills | Status: DC
  Filled 2022-03-05: qty 90, 90d supply, fill #0

## 2022-03-05 MED ORDER — ALBUTEROL SULFATE HFA 108 (90 BASE) MCG/ACT IN AERS
2.0000 | INHALATION_SPRAY | Freq: Four times a day (QID) | RESPIRATORY_TRACT | 1 refills | Status: AC | PRN
  Filled 2022-03-05: qty 54, 75d supply, fill #0

## 2022-03-05 MED ORDER — ESOMEPRAZOLE MAGNESIUM 40 MG PO CPDR
40.0000 mg | DELAYED_RELEASE_CAPSULE | Freq: Every day | ORAL | 4 refills | Status: DC
  Filled 2022-03-05: qty 90, 90d supply, fill #0

## 2022-03-05 MED ORDER — GABAPENTIN 300 MG PO CAPS
300.0000 mg | ORAL_CAPSULE | Freq: Every evening | ORAL | 4 refills | Status: DC
  Filled 2022-03-05: qty 90, 90d supply, fill #0
  Filled 2022-05-31: qty 90, 90d supply, fill #1
  Filled 2022-09-03: qty 90, 90d supply, fill #2
  Filled 2022-11-29: qty 90, 90d supply, fill #3
  Filled 2023-01-01 – 2023-03-04 (×2): qty 90, 90d supply, fill #4

## 2022-03-05 MED ORDER — ATORVASTATIN CALCIUM 40 MG PO TABS
40.0000 mg | ORAL_TABLET | Freq: Every day | ORAL | 4 refills | Status: DC
  Filled 2022-03-05: qty 90, 90d supply, fill #0

## 2022-03-05 MED ORDER — JARDIANCE 25 MG PO TABS
25.0000 mg | ORAL_TABLET | Freq: Every day | ORAL | 4 refills | Status: DC
  Filled 2022-03-05: qty 90, 90d supply, fill #0
  Filled 2022-05-31: qty 90, 90d supply, fill #1
  Filled 2022-11-29: qty 90, 90d supply, fill #2
  Filled 2023-01-01 – 2023-02-25 (×2): qty 90, 90d supply, fill #3

## 2022-03-06 ENCOUNTER — Other Ambulatory Visit: Payer: Self-pay | Admitting: *Deleted

## 2022-03-06 ENCOUNTER — Other Ambulatory Visit: Payer: Self-pay | Admitting: Cardiology

## 2022-03-06 DIAGNOSIS — E785 Hyperlipidemia, unspecified: Secondary | ICD-10-CM | POA: Diagnosis not present

## 2022-03-06 DIAGNOSIS — E1169 Type 2 diabetes mellitus with other specified complication: Secondary | ICD-10-CM

## 2022-03-06 NOTE — Progress Notes (Signed)
Order for microalbumin/creatinine ratio, urine test placed in the system and pt will have this sample collected today at our Lab.  ?

## 2022-03-07 LAB — MICROALBUMIN / CREATININE URINE RATIO
Creatinine, Urine: 96 mg/dL
Microalb/Creat Ratio: 6 mg/g creat (ref 0–29)
Microalbumin, Urine: 5.4 ug/mL

## 2022-03-12 ENCOUNTER — Other Ambulatory Visit: Payer: Self-pay | Admitting: Family Medicine

## 2022-03-12 DIAGNOSIS — Z1231 Encounter for screening mammogram for malignant neoplasm of breast: Secondary | ICD-10-CM

## 2022-03-25 ENCOUNTER — Other Ambulatory Visit (HOSPITAL_COMMUNITY): Payer: Self-pay

## 2022-03-27 NOTE — Telephone Encounter (Signed)
Spoke with patient. She is due to increase to 69m on Sunday. States that solid foods make her feel sick. Still eating. Has not started exercising. Knows she needs to. Still eating processed foods.  ?Discussed eating more vegetables and less processed foods (chips, crackers ect) ?Start exercising. Increase to 154m If she gets to the point where the thought of eating makes her sick then we need to decrease. ?

## 2022-04-09 ENCOUNTER — Other Ambulatory Visit (HOSPITAL_COMMUNITY): Payer: Self-pay

## 2022-04-10 ENCOUNTER — Ambulatory Visit
Admission: RE | Admit: 2022-04-10 | Discharge: 2022-04-10 | Disposition: A | Discharge status: Home / Self Care | Payer: 59 | Source: Ambulatory Visit | Attending: Family Medicine | Admitting: Family Medicine

## 2022-04-10 DIAGNOSIS — Z1231 Encounter for screening mammogram for malignant neoplasm of breast: Secondary | ICD-10-CM

## 2022-04-22 ENCOUNTER — Other Ambulatory Visit (HOSPITAL_COMMUNITY): Payer: Self-pay

## 2022-04-23 ENCOUNTER — Other Ambulatory Visit (HOSPITAL_COMMUNITY): Payer: Self-pay

## 2022-04-23 DIAGNOSIS — Z006 Encounter for examination for normal comparison and control in clinical research program: Secondary | ICD-10-CM

## 2022-04-23 MED ORDER — SEMAGLUTIDE-WEIGHT MANAGEMENT 2.4 MG/0.75ML ~~LOC~~ SOAJ
2.4000 mg | SUBCUTANEOUS | 11 refills | Status: DC
  Filled 2022-04-23 – 2022-05-21 (×2): qty 3, 28d supply, fill #0

## 2022-04-23 NOTE — Research (Addendum)
05/09/22- Left message for patient on voicemail regarding potential eligibility in research study. Left call back number and instructions for patient to return call.  Contacted patient regarding V1P study. Left message for patient to return call.

## 2022-04-23 NOTE — Addendum Note (Signed)
Addended by: Marcelle Overlie D on: 04/23/2022 07:23 AM   Modules accepted: Orders

## 2022-04-25 ENCOUNTER — Other Ambulatory Visit (HOSPITAL_COMMUNITY): Payer: Self-pay

## 2022-04-25 ENCOUNTER — Telehealth: Payer: Self-pay | Admitting: *Deleted

## 2022-04-25 MED ORDER — ROSUVASTATIN CALCIUM 20 MG PO TABS
20.0000 mg | ORAL_TABLET | Freq: Every day | ORAL | 3 refills | Status: DC
  Filled 2022-04-25: qty 90, 90d supply, fill #0
  Filled 2022-07-22: qty 90, 90d supply, fill #1

## 2022-04-25 NOTE — Telephone Encounter (Signed)
Pt notified Dr. Johney Frame about having increased muscle aches with increased atorvastatin 40 mg po daily.  Per Dr. Johney Frame, we will stop her atorvastatin and switch her to crestor 20 mg po daily instead, for this is more tolerable.  Pt aware to monitor symptoms on new statin change.  Confirmed the pharmacy of choice with the pt.  Updated atorvastatin in her chart as an intolerance.  Pt verbalized understanding and agrees with this plan.

## 2022-05-01 ENCOUNTER — Other Ambulatory Visit (HOSPITAL_COMMUNITY): Payer: Self-pay

## 2022-05-01 ENCOUNTER — Telehealth: Payer: Self-pay | Admitting: *Deleted

## 2022-05-01 MED ORDER — GLUCOSE BLOOD VI STRP
ORAL_STRIP | 1 refills | Status: AC
  Filled 2022-05-01: qty 50, 50d supply, fill #0

## 2022-05-01 NOTE — Telephone Encounter (Signed)
Pt requested for Dr. Johney Frame to refill her glucose blood test strips for blood sugar monitoring.  Per Dr. Johney Frame, ok to refill. Refill of strips sent to the pts confirmed pharmacy of choice.  Pt aware and was gracious for all the assistance provided.

## 2022-05-03 ENCOUNTER — Other Ambulatory Visit (HOSPITAL_COMMUNITY): Payer: Self-pay

## 2022-05-03 MED ORDER — FREESTYLE LITE TEST VI STRP
1.0000 | ORAL_STRIP | Freq: Every day | 3 refills | Status: DC
  Filled 2022-05-03: qty 50, 50d supply, fill #0
  Filled 2022-11-29: qty 100, 90d supply, fill #1
  Filled 2023-03-31: qty 100, 90d supply, fill #2

## 2022-05-03 MED ORDER — FREESTYLE LITE W/DEVICE KIT
1.0000 | PACK | Freq: Every day | 0 refills | Status: AC
  Filled 2022-05-03: qty 1, 1d supply, fill #0
  Filled 2022-05-03: qty 1, 30d supply, fill #0

## 2022-05-03 MED ORDER — FREESTYLE LANCETS MISC
1.0000 | Freq: Every day | 4 refills | Status: AC
  Filled 2022-05-03 (×2): qty 100, 90d supply, fill #0

## 2022-05-16 ENCOUNTER — Ambulatory Visit (INDEPENDENT_AMBULATORY_CARE_PROVIDER_SITE_OTHER): Payer: 59

## 2022-05-16 ENCOUNTER — Ambulatory Visit (INDEPENDENT_AMBULATORY_CARE_PROVIDER_SITE_OTHER): Payer: 59 | Admitting: Orthopaedic Surgery

## 2022-05-16 ENCOUNTER — Encounter: Payer: Self-pay | Admitting: Orthopaedic Surgery

## 2022-05-16 DIAGNOSIS — Z96652 Presence of left artificial knee joint: Secondary | ICD-10-CM | POA: Diagnosis not present

## 2022-05-16 NOTE — Progress Notes (Signed)
The patient is a year out from a left total knee arthroplasty.  She is only 61 years old.  She has well-documented end-stage arthritis for over 5 years.  We replaced her knee with press-fit implants.  She does have some discomfort and stiffness at times but states she has been doing better overall.  There is no effusion with her left knee today.  She has full range of motion and is ligamentously stable.  2 views of the left knee show well-seated total knee arthroplasty with no complicating features.  It does appear to be good bone ingrown.  At this point follow-up for her left knee can be as needed.  If she does develop any issues with that at all she knows to let us know.  All questions and concerns were answered and addressed.

## 2022-05-21 ENCOUNTER — Other Ambulatory Visit: Payer: Self-pay | Admitting: Specialist

## 2022-05-21 ENCOUNTER — Telehealth: Payer: Self-pay | Admitting: Pharmacist

## 2022-05-21 ENCOUNTER — Other Ambulatory Visit (HOSPITAL_COMMUNITY): Payer: Self-pay

## 2022-05-21 ENCOUNTER — Encounter: Payer: Self-pay | Admitting: Internal Medicine

## 2022-05-21 MED ORDER — DICLOFENAC SODIUM 1 % EX GEL
CUTANEOUS | 0 refills | Status: DC
  Filled 2022-05-21: qty 500, 30d supply, fill #0

## 2022-05-21 NOTE — Telephone Encounter (Signed)
Patient needs prior authorization renewed for [facility]. She is due to start the 2.60m dose on Sunday. She has only lost about a pound. Has not started exercising yet.

## 2022-05-22 ENCOUNTER — Other Ambulatory Visit: Payer: Self-pay | Admitting: Specialist

## 2022-05-22 ENCOUNTER — Other Ambulatory Visit (HOSPITAL_COMMUNITY): Payer: Self-pay

## 2022-05-23 ENCOUNTER — Other Ambulatory Visit (HOSPITAL_COMMUNITY): Payer: Self-pay

## 2022-05-23 ENCOUNTER — Telehealth: Payer: Self-pay | Admitting: Specialist

## 2022-05-23 MED ORDER — SEMAGLUTIDE-WEIGHT MANAGEMENT 2.4 MG/0.75ML ~~LOC~~ SOAJ
2.4000 mg | SUBCUTANEOUS | 11 refills | Status: DC
  Filled 2022-05-23 – 2022-05-24 (×2): qty 3, 28d supply, fill #0
  Filled 2022-06-17: qty 3, 28d supply, fill #1
  Filled 2022-07-16: qty 3, 28d supply, fill #2
  Filled 2022-08-12: qty 3, 28d supply, fill #3
  Filled 2022-09-09 – 2022-09-10 (×2): qty 3, 28d supply, fill #4
  Filled 2022-10-04: qty 3, 28d supply, fill #5
  Filled 2022-10-31: qty 3, 28d supply, fill #6
  Filled 2022-11-29: qty 3, 28d supply, fill #7
  Filled 2022-12-25: qty 3, 28d supply, fill #8

## 2022-05-23 NOTE — Telephone Encounter (Signed)
Prior auth approved for 4 fills through 09/19/22, refill sent in.

## 2022-05-23 NOTE — Telephone Encounter (Signed)
Received $25.00 cash,medical records release form and FMLA paperwork from patient/Forwarding to [facility] today

## 2022-05-24 ENCOUNTER — Other Ambulatory Visit (HOSPITAL_COMMUNITY): Payer: Self-pay

## 2022-05-24 ENCOUNTER — Ambulatory Visit: Payer: 59 | Admitting: Specialist

## 2022-05-24 MED ORDER — DICLOFENAC SODIUM 1 % EX GEL
4.0000 g | Freq: Four times a day (QID) | CUTANEOUS | 6 refills | Status: DC
  Filled 2022-05-24: qty 600, 38d supply, fill #0

## 2022-05-30 ENCOUNTER — Ambulatory Visit (INDEPENDENT_AMBULATORY_CARE_PROVIDER_SITE_OTHER): Payer: 59 | Admitting: Specialist

## 2022-05-30 ENCOUNTER — Ambulatory Visit (INDEPENDENT_AMBULATORY_CARE_PROVIDER_SITE_OTHER): Payer: 59

## 2022-05-30 ENCOUNTER — Encounter: Payer: Self-pay | Admitting: Specialist

## 2022-05-30 VITALS — BP 110/75 | HR 84 | Ht 61.0 in | Wt 177.5 lb

## 2022-05-30 DIAGNOSIS — M48062 Spinal stenosis, lumbar region with neurogenic claudication: Secondary | ICD-10-CM

## 2022-05-30 DIAGNOSIS — M4316 Spondylolisthesis, lumbar region: Secondary | ICD-10-CM

## 2022-05-30 DIAGNOSIS — M25512 Pain in left shoulder: Secondary | ICD-10-CM

## 2022-05-30 NOTE — Progress Notes (Signed)
Office Visit Note   Patient: Cristina [name]           Date of Birth: 10/14/1961           MRN: [id number] Visit Date: 05/30/2022              Requested by: [name], MD [place],  [place] [address] PCP: [name], MD   Assessment & Plan: Visit Diagnoses:  1. Spondylolisthesis, lumbar region   2. Spinal stenosis of lumbar region with neurogenic claudication     Plan: Avoid bending, stooping and avoid lifting weights greater than 10-15 lbs. Avoid prolong standing and walking. Avoid frequent bending and stooping  No lifting greater than 10-15 lbs. May use ice or moist heat for pain. Weight loss is of benefit. Handicap license is approved.  Follow-Up Instructions: No follow-ups on file.   Orders:  Orders Placed This Encounter  Procedures   XR Lumbar Spine 2-3 Views   No orders of the defined types were placed in this encounter.     Procedures: No procedures performed   Clinical Data: No additional findings.   Subjective: Chief Complaint  Patient presents with   Lower Back - Follow-up    61 year old female [place] working at [employer]. She has difficulty with prolong standing and walking. She has a spondylolisthesis but functions at a high level with ability to work and stand and walking. She has pain into the left shoulder with a difficulty lying on the left shoulder. No bowel or bladder difficulty.   Review of Systems   Objective: Vital Signs: BP 110/75 (BP Location: Left Arm, Patient Position: Sitting)   Pulse 84   Ht 5' 1"  (1.549 m)   Wt 177 lb 8 oz (80.5 kg)   LMP 11/11/2010   BMI 33.54 kg/m   Physical Exam  Ortho Exam  Specialty Comments:  No specialty comments available.  Imaging: No results found.   PMFS History: Patient Active Problem List   Diagnosis Date Noted   Status post left knee replacement 06/01/2021   Status post total left knee replacement 05/31/2021   Unilateral primary osteoarthritis,  left knee 12/21/2020   Morbid obesity with body mass index (BMI) of 45.0 to 49.9 in adult ([place]) 07/06/2018   Type 2 diabetes mellitus without complication, without long-term current use of insulin ([place]) 05/22/2017   Class 3 obesity with serious comorbidity and body mass index (BMI) of 40.0 to 44.9 in adult 05/21/2017   Family history of colon cancer 04/01/2017   Irritable bowel syndrome with diarrhea 04/01/2017   NSAID long-term use 04/01/2017   Lymphocytosis 02/24/2017   Essential hypertension 02/12/2017   Mild intermittent asthma with acute exacerbation 12/12/2016   Left shoulder pain 06/02/2015   Insomnia 05/12/2014   Fibromyalgia 05/12/2014   Allergic rhinitis 03/28/2014   Osteoarthritis 12/20/2013   Cervical strain 06/21/2013   Low back pain 06/21/2013   Metatarsalgia of both feet 03/11/2013   Sciatica 01/25/2013   Restless leg syndrome 11/24/2012   Pure hypercholesterolemia 10/20/2012   OSA on CPAP 10/20/2012   Depression 10/20/2012   Iron deficiency anemia, unspecified 10/01/2012   Snoring 10/01/2012   Diabetes mellitus, type 2 ([place]) 12/31/2010   ANAL FISSURE 12/31/2010   Morbid obesity ([place]) 01/08/2007   CONSTIPATION 01/08/2007   Past Medical History:  Diagnosis Date   Allergic rhinitis, cause unspecified    Anemia    Arthritis    Asthma    Back pain  Chest pain    Constipation    CTS (carpal tunnel syndrome)    Cystocele    Depression    Diabetes mellitus    Dysfunction of eustachian tube    Fatty liver    Fibromyalgia    Fissure, anal    Gallbladder problem    Genital herpes    GERD (gastroesophageal reflux disease)    Headache    Hemorrhoid    Hx of migraine headaches    Hyperlipidemia    IBS (irritable bowel syndrome)    Insomnia    Iron deficiency anemia, unspecified    Joint pain    Nausea    Obesity    OSA on CPAP    Osteoarthritis    Rectocele    Sleep apnea    TMJ (dislocation of temporomandibular joint)     Family History  Problem  Relation Age of Onset   Hypertension Mother 93   Osteoarthritis Mother    Diabetes Mother    Colon cancer Mother 84   Cancer Mother 59       colon cancer   Hyperlipidemia Mother    Obesity Mother    Sudden death Mother    Arthritis Mother    Colon cancer Paternal Grandmother    Cancer Paternal Grandmother        colon cancer   Stroke Maternal Grandmother    Colon cancer Maternal Grandmother    Colon polyps Brother        x 2 brother   Diabetes Daughter    Hypertension Daughter    Sleep apnea Daughter    Mental illness Daughter        depression   Migraines Daughter    Cirrhosis Brother        alcoholism   Alcohol abuse Brother    Migraines Daughter    Mental illness Daughter        anxiety attacks   Protein S deficiency Daughter    Sleep apnea Brother    Hyperlipidemia Brother    Migraines Brother    Obstructive Sleep Apnea Brother    Colon cancer Paternal Aunt     Past Surgical History:  Procedure Laterality Date   ABDOMINAL HYSTERECTOMY  11/11/2010   [place] Plan.  Ovaries intact.  Uterine fibroids with DUB.   CARPAL TUNNEL RELEASE  11/12/1987   Left   CHOLECYSTECTOMY     CHOLECYSTECTOMY     COLONOSCOPY  06/29/2012   normal.  Repeat in 5 years.   DILATION AND CURETTAGE OF UTERUS     ECTOPIC PREGNANCY SURGERY  11/12/1991   ESOPHAGOGASTRODUODENOSCOPY  12/13/2011   dysphagia.  Henrene Pastor.  Normal.   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  CPAP titration at 12 cm water pressure.   TOTAL KNEE ARTHROPLASTY Left 05/31/2021   Procedure: LEFT TOTAL KNEE ARTHROPLASTY;  Surgeon: Mcarthur Rossetti, MD;  Location: [facility];  Service: Orthopedics;  Laterality: Left;   TUBAL LIGATION  11/11/1986   Social History   Occupational History   Occupation: Audiological scientist:  [employer]    Comment: cma/[employer]  Tobacco Use   Smoking status: Former    Types: Cigarettes   Smokeless tobacco: Never   Tobacco comments:    smoked occasionally longest 6 mos.  Vaping Use  Vaping Use: Never used  Substance and Sexual Activity   Alcohol use: No    Alcohol/week: 1.0 standard drink of alcohol    Types: 1 Glasses of wine per week   Drug use: No   Sexual activity: Not Currently    Birth control/protection: None

## 2022-05-30 NOTE — Patient Instructions (Addendum)
Avoid bending, stooping and avoid lifting weights greater than 10-15 lbs. Avoid prolong standing and walking. Avoid frequent bending and stooping  No lifting greater than 10-15 lbs. May use ice or moist heat for pain. Weight loss is of benefit. Handicap license is approved.

## 2022-05-31 ENCOUNTER — Other Ambulatory Visit (HOSPITAL_COMMUNITY): Payer: Self-pay

## 2022-06-14 ENCOUNTER — Telehealth: Payer: Self-pay | Admitting: Specialist

## 2022-06-14 NOTE — Telephone Encounter (Signed)
[employer] forms received. To [employer]. 

## 2022-06-17 ENCOUNTER — Other Ambulatory Visit (HOSPITAL_COMMUNITY): Payer: Self-pay

## 2022-06-20 ENCOUNTER — Other Ambulatory Visit (HOSPITAL_COMMUNITY): Payer: Self-pay

## 2022-06-20 ENCOUNTER — Ambulatory Visit: Payer: 59 | Admitting: Internal Medicine

## 2022-06-20 ENCOUNTER — Encounter: Payer: Self-pay | Admitting: Internal Medicine

## 2022-06-20 VITALS — BP 100/70 | HR 75 | Ht 61.0 in | Wt 170.5 lb

## 2022-06-20 DIAGNOSIS — Z1211 Encounter for screening for malignant neoplasm of colon: Secondary | ICD-10-CM

## 2022-06-20 DIAGNOSIS — Z800 Family history of malignant neoplasm of digestive organs: Secondary | ICD-10-CM

## 2022-06-20 MED ORDER — NA SULFATE-K SULFATE-MG SULF 17.5-3.13-1.6 GM/177ML PO SOLN
1.0000 | Freq: Once | ORAL | 0 refills | Status: AC
  Filled 2022-06-20: qty 354, 1d supply, fill #0

## 2022-06-20 NOTE — Progress Notes (Signed)
HISTORY OF PRESENT ILLNESS:  Cristina Cristina [name] is a 61 y.o. female, [employer] employee, with multiple significant medical problems including diabetes mellitus who presents today regarding high risk screening colonoscopy.  Patient has strong family history of colon cancer in first and second-degree relatives.  She has undergone screening on multiple occasions in the past including 2008, 2013, and 2018.  These examinations have been normal.  Her only GI issues are GERD for which she takes Nexium and intermittent issues with anal fissure for which she uses diltiazem ointment.  She was put on Wegovy 3 months ago.  She has lost some weight.  Doing well with regards to her lungs.  No longer on metformin.  Not taking prednisone.  GI review of systems is unremarkable.  Review of blood work from January 2023 shows unremarkable comprehensive metabolic panel except for mild elevation of ALT.  Normal CBC with hemoglobin 14.8.  REVIEW OF SYSTEMS:  All non-GI ROS negative unless otherwise stated in the HPI except for arthritis, back pain  Past Medical History:  Diagnosis Date   Allergic rhinitis, cause unspecified    Anemia    Arthritis    Asthma    Back pain    Chest pain    Constipation    CTS (carpal tunnel syndrome)    Cystocele    Depression    Diabetes mellitus    Dysfunction of eustachian tube    Fatty liver    Fibromyalgia    Fissure, anal    Gallbladder problem    Genital herpes    GERD (gastroesophageal reflux disease)    Headache    Hemorrhoid    Hx of migraine headaches    Hyperlipidemia    IBS (irritable bowel syndrome)    Insomnia    Iron deficiency anemia, unspecified    Joint pain    Nausea    Obesity    OSA on CPAP    Osteoarthritis    Rectocele    Sleep apnea    TMJ (dislocation of temporomandibular joint)     Past Surgical History:  Procedure Laterality Date   ABDOMINAL HYSTERECTOMY  11/11/2010   [place] Plan.  Ovaries intact.  Uterine fibroids with DUB.   CARPAL  TUNNEL RELEASE  11/12/1987   Left   CHOLECYSTECTOMY     CHOLECYSTECTOMY     COLONOSCOPY  06/29/2012   normal.  Repeat in 5 years.   DILATION AND CURETTAGE OF UTERUS     ECTOPIC PREGNANCY SURGERY  11/12/1991   ESOPHAGOGASTRODUODENOSCOPY  12/13/2011   dysphagia.  Henrene Pastor.  Normal.   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  CPAP titration at 12 cm water pressure.   TOTAL KNEE ARTHROPLASTY Left 05/31/2021   Procedure: LEFT TOTAL KNEE ARTHROPLASTY;  Surgeon: Mcarthur Rossetti, MD;  Location: [facility];  Service: Orthopedics;  Laterality: Left;   TUBAL LIGATION  11/11/1986    Social History Cristina Cristina [name]  reports that she has quit smoking. Her smoking use included cigarettes. She has never used smokeless tobacco. She reports that she does not drink alcohol and does not use drugs.  family history includes Alcohol abuse in her brother; Arthritis in her mother; Cancer in her paternal grandmother; Cancer (age of onset: 81) in her mother; Cirrhosis in her brother; Colon cancer in her paternal  aunt and paternal grandmother; Colon cancer (age of onset: 42) in her mother; Colon polyps in her brother; Diabetes in her daughter and mother; Hyperlipidemia in her brother and mother; Hypertension in her daughter; Hypertension (age of onset: 59) in her mother; Mental illness in her daughter and daughter; Migraines in her brother, daughter, and daughter; Obesity in her mother; Obstructive Sleep Apnea in her brother; Osteoarthritis in her mother; Protein S deficiency in her daughter; Sleep apnea in her brother and daughter; Stroke in her maternal grandmother; Sudden death in her mother.  Allergies  Allergen Reactions   Atorvastatin Other (See Comments)    Pt reports causes muscle aches   Fish Oil Rash        PHYSICAL EXAMINATION: Vital signs: BP 100/70   Cristina [name] 77   Ht 5' 1"  (1.549 m)   Wt 170 lb 8 oz (77.3 kg)   LMP 11/11/2010   BMI 32.22 kg/m   Constitutional: generally well-appearing, no acute distress Psychiatric: alert and oriented x3, cooperative Eyes: extraocular movements intact, anicteric, conjunctiva pink Mouth: oral pharynx moist, no lesions Neck: supple no lymphadenopathy Cardiovascular: heart regular rate and rhythm, no murmur Lungs: clear to auscultation bilaterally Abdomen: soft, nontender, nondistended, no obvious ascites, no peritoneal signs, normal bowel sounds, no organomegaly Rectal: Deferred until colonoscopy Extremities: no clubbing, cyanosis, or lower extremity edema bilaterally Skin: no lesions on visible extremities Neuro: No focal deficits.  Cranial nerves intact  ASSESSMENT:  1.  Family history of colon cancer in first and multiple second-degree relatives.  Prior colonoscopic exams negative for neoplasia.  Due for follow-up screening 2.  Multiple medical problems including insulin requiring diabetes 3.  GERD.  Managed with omeprazole 4.  History of anal fissure.  Stable  PLAN:  1.  Reflux precautions 2.  Continue Nexium 3.  Weight loss 4.  Schedule screening colonoscopy.  Patient is high risk given her comorbidities and body habitus.The nature of the procedure, as well as the risks, benefits, and alternatives were carefully and thoroughly reviewed with the patient. Ample time for discussion and questions allowed. The patient understood, was satisfied, and agreed to proceed.  5.  Hold Wegovy at least 3 days prior to the procedure 6.  Ongoing general medical care with PCP

## 2022-06-20 NOTE — Patient Instructions (Signed)
_______________________________________________________  If you are age 59 or older, your body mass index should be between 23-30. Your Body mass index is 32.22 kg/m. If this is out of the aforementioned range listed, please consider follow up with your Primary Care Provider.  If you are age 96 or younger, your body mass index should be between 19-25. Your Body mass index is 32.22 kg/m. If this is out of the aformentioned range listed, please consider follow up with your Primary Care Provider.   ________________________________________________________  The [facility] providers would like to encourage you to use [facility] to communicate with providers for non-urgent requests or questions.  Due to long hold times on the telephone, sending your provider a message by [facility] may be a faster and more efficient way to get a response.  Please allow 48 business hours for a response.  Please remember that this is for non-urgent requests.  _______________________________________________________  Cristina [name] have been scheduled for a colonoscopy. Please follow written instructions given to you at your visit today.  Please pick up your prep supplies at the pharmacy within the next 1-3 days. If you use inhalers (even only as needed), please bring them with you on the day of your procedure.

## 2022-06-21 ENCOUNTER — Other Ambulatory Visit (HOSPITAL_COMMUNITY): Payer: Self-pay

## 2022-06-24 ENCOUNTER — Telehealth: Payer: Self-pay | Admitting: Specialist

## 2022-06-24 NOTE — Telephone Encounter (Signed)
[employer] forms received. To [employer]. 

## 2022-07-04 ENCOUNTER — Ambulatory Visit (AMBULATORY_SURGERY_CENTER): Payer: 59 | Admitting: Internal Medicine

## 2022-07-04 ENCOUNTER — Encounter: Payer: Self-pay | Admitting: Internal Medicine

## 2022-07-04 VITALS — BP 114/64 | HR 53 | Temp 96.0°F | Resp 10 | Ht 61.0 in | Wt 170.0 lb

## 2022-07-04 DIAGNOSIS — Z800 Family history of malignant neoplasm of digestive organs: Secondary | ICD-10-CM | POA: Diagnosis not present

## 2022-07-04 DIAGNOSIS — D125 Benign neoplasm of sigmoid colon: Secondary | ICD-10-CM

## 2022-07-04 DIAGNOSIS — G4733 Obstructive sleep apnea (adult) (pediatric): Secondary | ICD-10-CM | POA: Diagnosis not present

## 2022-07-04 DIAGNOSIS — D122 Benign neoplasm of ascending colon: Secondary | ICD-10-CM | POA: Diagnosis not present

## 2022-07-04 DIAGNOSIS — Z1211 Encounter for screening for malignant neoplasm of colon: Secondary | ICD-10-CM

## 2022-07-04 DIAGNOSIS — I2510 Atherosclerotic heart disease of native coronary artery without angina pectoris: Secondary | ICD-10-CM | POA: Diagnosis not present

## 2022-07-04 DIAGNOSIS — E119 Type 2 diabetes mellitus without complications: Secondary | ICD-10-CM | POA: Diagnosis not present

## 2022-07-04 MED ORDER — SODIUM CHLORIDE 0.9 % IV SOLN: 500.0000 mL | Freq: Once | INTRAVENOUS | Status: DC

## 2022-07-04 NOTE — Patient Instructions (Signed)
  HANDOUT ON POLYPS GIVEN.  YOU HAD AN ENDOSCOPIC PROCEDURE TODAY AT [facility]:   Refer to the procedure report that was given to you for any specific questions about what was found during the examination.  If the procedure report does not answer your questions, please call your gastroenterologist to clarify.  If you requested that your care partner not be given the details of your procedure findings, then the procedure report has been included in a sealed envelope for you to review at your convenience later.  YOU SHOULD EXPECT: Some feelings of bloating in the abdomen. Passage of more gas than usual.  Walking can help get rid of the air that was put into your GI tract during the procedure and reduce the bloating. If you had a lower endoscopy (such as a colonoscopy or flexible sigmoidoscopy) you may notice spotting of blood in your stool or on the toilet paper. If you underwent a bowel prep for your procedure, you may not have a normal bowel movement for a few days.  Please Note:  You might notice some irritation and congestion in your nose or some drainage.  This is from the oxygen used during your procedure.  There is no need for concern and it should clear up in a day or so.  SYMPTOMS TO REPORT IMMEDIATELY:  Following lower endoscopy (colonoscopy or flexible sigmoidoscopy):  Excessive amounts of blood in the stool  Significant tenderness or worsening of abdominal pains  Swelling of the abdomen that is new, acute  Fever of 100F or higher  For urgent or emergent issues, a gastroenterologist can be reached at any hour by calling [phone]. Do not use MyChart messaging for urgent concerns.    DIET:  We do recommend a small meal at first, but then you may proceed to your regular diet.  Drink plenty of fluids but you should avoid alcoholic beverages for 24 hours.  ACTIVITY:  You should plan to take it easy for the rest of today and you should NOT DRIVE or use heavy  machinery until tomorrow (because of the sedation medicines used during the test).    FOLLOW UP: Our staff will call the number listed on your records the next business day following your procedure.  We will call around 7:15- 8:00 am to check on you and address any questions or concerns that you may have regarding the information given to you following your procedure. If we do not reach you, we will leave a message.  If you develop any symptoms (ie: fever, flu-like symptoms, shortness of breath, cough etc.) before then, please call [phone].  If you test positive for Covid 19 in the 2 weeks post procedure, please call and report this information to [name].    If any biopsies were taken you will be contacted by phone or by letter within the next 1-3 weeks.  Please call us at [phone] if you have not heard about the biopsies in 3 weeks.    SIGNATURES/CONFIDENTIALITY: You and/or your care partner have signed paperwork which will be entered into your electronic medical record.  These signatures attest to the fact that that the information above on your After Visit Summary has been reviewed and is understood.  Full responsibility of the confidentiality of this discharge information lies with you and/or your care-partner.

## 2022-07-04 NOTE — Progress Notes (Signed)
Report to PACU, RN, vss, BBS= Clear.  

## 2022-07-04 NOTE — Op Note (Signed)
[facility] Patient Name: Cristina [name] Procedure Date: 07/04/2022 3:25 PM MRN: [id number] Endoscopist: Docia Chuck. Henrene Pastor , MD Age: 61 Referring MD:  Date of Birth: 12/23/1960 Gender: Female Account #: [id number] Procedure:                Colonoscopy with cold snare polypectomy x 2 Indications:              Screening in patient at increased risk: Family                            history of 1st-degree relative with colorectal                            cancer Medicines:                Monitored Anesthesia Care Procedure:                Pre-Anesthesia Assessment:                           - Prior to the procedure, a History and Physical                            was performed, and patient medications and                            allergies were reviewed. The patient's tolerance of                            previous anesthesia was also reviewed. The risks                            and benefits of the procedure and the sedation                            options and risks were discussed with the patient.                            All questions were answered, and informed consent                            was obtained. Prior Anticoagulants: The patient has                            taken no previous anticoagulant or antiplatelet                            agents. After reviewing the risks and benefits, the                            patient was deemed in satisfactory condition to                            undergo the procedure.  After obtaining informed consent, the colonoscope                            was passed under direct vision. Throughout the                            procedure, the patient's blood pressure, pulse, and                            oxygen saturations were monitored continuously. The                            CF [id number] #[id number] was introduced through the anus                            and advanced to the the cecum, identified by                             appendiceal orifice and ileocecal valve. The                            ileocecal valve, appendiceal orifice, and rectum                            were photographed. The quality of the bowel                            preparation was excellent. The colonoscopy was                            performed without difficulty. The patient tolerated                            the procedure well. The bowel preparation used was                            SUPREP via split dose instruction. Scope In: 3:33:34 PM Scope Out: 3:51:19 PM Scope Withdrawal Time: 0 hours 14 minutes 5 seconds  Total Procedure Duration: 0 hours 17 minutes 45 seconds  Findings:                 Two polyps were found in the sigmoid colon and                            ascending colon. The polyps were 3 (sigmoid) to 9                            (ascending) mm in size. These polyps were removed                            with a cold snare. Resection and retrieval were                            complete.  The exam was otherwise without abnormality on                            direct and retroflexion views. Complications:            No immediate complications. Estimated blood loss:                            None. Estimated Blood Loss:     Estimated blood loss: none. Impression:               - Two 3 to 9 mm polyps in the sigmoid colon and in                            the ascending colon, removed with a cold snare.                            Resected and retrieved.                           - The examination was otherwise normal on direct                            and retroflexion views. Recommendation:           - Repeat colonoscopy in 5 years for surveillance.                           - Patient has a contact number available for                            emergencies. The signs and symptoms of potential                            delayed complications were discussed with  the                            patient. Return to normal activities tomorrow.                            Written discharge instructions were provided to the                            patient.                           - Resume previous diet.                           - Continue present medications.                           - Await pathology results. Docia Chuck. Henrene Pastor, MD 07/04/2022 3:57:34 PM This report has been signed electronically.

## 2022-07-04 NOTE — Progress Notes (Signed)
Called to room to assist during endoscopic procedure.  Patient ID and intended procedure confirmed with present staff. Received instructions for my participation in the procedure from the performing physician.  

## 2022-07-04 NOTE — Progress Notes (Signed)
HISTORY OF PRESENT ILLNESS:   Cristina [name] is a 61 y.o. female, [employer] employee, with multiple significant medical problems including diabetes mellitus who presents today regarding high risk screening colonoscopy.  Patient has strong family history of colon cancer in first and second-degree relatives.  She has undergone screening on multiple occasions in the past including 2008, 2013, and 2018.  These examinations have been normal.  Her only GI issues are GERD for which she takes Nexium and intermittent issues with anal fissure for which she uses diltiazem ointment.  She was put on Wegovy 3 months ago.  She has lost some weight.  Doing well with regards to her lungs.  No longer on metformin.  Not taking prednisone.  GI review of systems is unremarkable.  Review of blood work from January 2023 shows unremarkable comprehensive metabolic panel except for mild elevation of ALT.  Normal CBC with hemoglobin 14.8.   REVIEW OF SYSTEMS:   All non-GI ROS negative unless otherwise stated in the HPI except for arthritis, back pain       Past Medical History:  Diagnosis Date   Allergic rhinitis, cause unspecified     Anemia     Arthritis     Asthma     Back pain     Chest pain     Constipation     CTS (carpal tunnel syndrome)     Cystocele     Depression     Diabetes mellitus     Dysfunction of eustachian tube     Fatty liver     Fibromyalgia     Fissure, anal     Gallbladder problem     Genital herpes     GERD (gastroesophageal reflux disease)     Headache     Hemorrhoid     Hx of migraine headaches     Hyperlipidemia     IBS (irritable bowel syndrome)     Insomnia     Iron deficiency anemia, unspecified     Joint pain     Nausea     Obesity     OSA on CPAP     Osteoarthritis     Rectocele     Sleep apnea     TMJ (dislocation of temporomandibular joint)             Past Surgical History:  Procedure Laterality Date   ABDOMINAL HYSTERECTOMY   11/11/2010    [place] Plan.   Ovaries intact.  Uterine fibroids with DUB.   CARPAL TUNNEL RELEASE   11/12/1987    Left   CHOLECYSTECTOMY       CHOLECYSTECTOMY       COLONOSCOPY   06/29/2012    normal.  Repeat in 5 years.   DILATION AND CURETTAGE OF UTERUS       ECTOPIC PREGNANCY SURGERY   11/12/1991   ESOPHAGOGASTRODUODENOSCOPY   12/13/2011    dysphagia.  Henrene Pastor.  Normal.   GASTRIC ROUX-EN-Y N/A 07/06/2018    Procedure: LAPAROSCOPIC ROUX-EN-Y GASTRIC BYPASS WITH UPPER ENDOSCOPY;  Surgeon: Excell Seltzer, MD;  Location: [facility];  Service: General;  Laterality: N/A;   Sleep study   09/11/2012    severe OSA.  CPAP titration at 12 cm water pressure.   TOTAL KNEE ARTHROPLASTY Left 05/31/2021    Procedure: LEFT TOTAL KNEE ARTHROPLASTY;  Surgeon: Mcarthur Rossetti, MD;  Location: [facility];  Service: Orthopedics;  Laterality: Left;   TUBAL LIGATION   11/11/1986      Social  History SHENIQUE CHILDERS  reports that she has quit smoking. Her smoking use included cigarettes. She has never used smokeless tobacco. She reports that she does not drink alcohol and does not use drugs.   family history includes Alcohol abuse in her brother; Arthritis in her mother; Cancer in her paternal grandmother; Cancer (age of onset: 59) in her mother; Cirrhosis in her brother; Colon cancer in her paternal aunt and paternal grandmother; Colon cancer (age of onset: 58) in her mother; Colon polyps in her brother; Diabetes in her daughter and mother; Hyperlipidemia in her brother and mother; Hypertension in her daughter; Hypertension (age of onset: 51) in her mother; Mental illness in her daughter and daughter; Migraines in her brother, daughter, and daughter; Obesity in her mother; Obstructive Sleep Apnea in her brother; Osteoarthritis in her mother; Protein S deficiency in her daughter; Sleep apnea in her brother and daughter; Stroke in her maternal grandmother; Sudden death in her mother.        Allergies  Allergen Reactions   Atorvastatin Other  (See Comments)      Pt reports causes muscle aches   Fish Oil Rash          PHYSICAL EXAMINATION: Vital signs: BP 100/70   Pulse 75   Ht 5' 1"  (1.549 m)   Wt 170 lb 8 oz (77.3 kg)   LMP 11/11/2010   BMI 32.22 kg/m   Constitutional: generally well-appearing, no acute distress Psychiatric: alert and oriented x3, cooperative Eyes: extraocular movements intact, anicteric, conjunctiva pink Mouth: oral pharynx moist, no lesions Neck: supple no lymphadenopathy Cardiovascular: heart regular rate and rhythm, no murmur Lungs: clear to auscultation bilaterally Abdomen: soft, nontender, nondistended, no obvious ascites, no peritoneal signs, normal bowel sounds, no organomegaly Rectal: Deferred until colonoscopy Extremities: no clubbing, cyanosis, or lower extremity edema bilaterally Skin: no lesions on visible extremities Neuro: No focal deficits.  Cranial nerves intact   ASSESSMENT:   1.  Family history of colon cancer in first and multiple second-degree relatives.  Prior colonoscopic exams negative for neoplasia.  Due for follow-up screening 2.  Multiple medical problems including insulin requiring diabetes 3.  GERD.  Managed with omeprazole 4.  History of anal fissure.  Stable   PLAN:   1.  Reflux precautions 2.  Continue Nexium 3.  Weight loss 4.  Schedule screening colonoscopy.  Patient is high risk given her comorbidities and body habitus.The nature of the procedure, as well as the risks, benefits, and alternatives were carefully and thoroughly reviewed with the patient. Ample time for discussion and questions allowed. The patient understood, was satisfied, and agreed to proceed.  5.  Hold Wegovy at least 3 days prior to the procedure 6.  Ongoing general medical care with PCP

## 2022-07-05 ENCOUNTER — Telehealth: Payer: Self-pay | Admitting: *Deleted

## 2022-07-05 NOTE — Telephone Encounter (Signed)
  Follow up Call-     07/04/2022    2:47 PM  Call back number  Post procedure Call Back phone  # [phone]  Permission to leave phone message Yes     Patient questions:  Do you have a fever, pain , or abdominal swelling? No. Pain Score  0 *  Have you tolerated food without any problems? Yes.    Have you been able to return to your normal activities? Yes.    Do you have any questions about your discharge instructions: Diet   No. Medications  No. Follow up visit  No.  Do you have questions or concerns about your Care? No.  Actions: * If pain score is 4 or above: No action needed, pain <4.

## 2022-07-10 ENCOUNTER — Encounter: Payer: Self-pay | Admitting: Internal Medicine

## 2022-07-16 ENCOUNTER — Other Ambulatory Visit (HOSPITAL_COMMUNITY): Payer: Self-pay

## 2022-07-18 ENCOUNTER — Other Ambulatory Visit (HOSPITAL_COMMUNITY): Payer: Self-pay

## 2022-07-18 MED ORDER — ESCITALOPRAM OXALATE 10 MG PO TABS
10.0000 mg | ORAL_TABLET | Freq: Every day | ORAL | 1 refills | Status: DC
  Filled 2022-07-18 – 2022-10-02 (×3): qty 90, 90d supply, fill #0
  Filled 2023-01-01: qty 90, 90d supply, fill #1

## 2022-07-22 ENCOUNTER — Other Ambulatory Visit (HOSPITAL_COMMUNITY): Payer: Self-pay

## 2022-08-08 ENCOUNTER — Ambulatory Visit (INDEPENDENT_AMBULATORY_CARE_PROVIDER_SITE_OTHER): Payer: 59 | Admitting: Surgery

## 2022-08-08 ENCOUNTER — Encounter: Payer: Self-pay | Admitting: Surgery

## 2022-08-08 VITALS — BP 95/66 | HR 86 | Ht 61.0 in | Wt 170.0 lb

## 2022-08-08 DIAGNOSIS — M25512 Pain in left shoulder: Secondary | ICD-10-CM | POA: Diagnosis not present

## 2022-08-08 DIAGNOSIS — M7530 Calcific tendinitis of unspecified shoulder: Secondary | ICD-10-CM | POA: Diagnosis not present

## 2022-08-08 DIAGNOSIS — M7542 Impingement syndrome of left shoulder: Secondary | ICD-10-CM

## 2022-08-08 NOTE — Progress Notes (Signed)
Office Visit Note   Patient: Cristina [name]           Date of Birth: [birth year]           MRN: [id number] Visit Date: 08/08/2022              Requested by: [name], MD [place],  [place] [address] PCP: [name], MD   Assessment & Plan: Visit Diagnoses:  1. Acute pain of left shoulder   2. Impingement syndrome of left shoulder   3. Calcific supraspinatus tendinitis     Plan: With patient's ongoing left shoulder pain and findings on previous x-ray that is failed conservative treatment I will get MRI to rule out rotator cuff tear and other shoulder pathology.  She will follow-up me after completion of her study on the day that Dr. Marlou Sa is in clinic and I will discuss results with him as well.  Follow-Up Instructions: Return in about 3 weeks (around 08/29/2022) for [place] MRI (PLEASE SCHEDULE ON A DAY DR Imlay).   Orders:  Orders Placed This Encounter  Procedures   MR Shoulder Left w/o contrast   No orders of the defined types were placed in this encounter.     Procedures: No procedures performed   Clinical Data: No additional findings.   Subjective: Chief Complaint  Patient presents with   Left Shoulder - Pain    HPI 61 year old female returns for recheck of her left shoulder pain.  States that previous injection did not give any relief.  Continues to have pain with overhead activity and reaching on her back.  Pain when she lays on her left side.  She has tried to use Voltaren gel without any improvement.  Previous x-rays left shoulder did show AC degenerative changes with calcific tendinopathy at the greater tuberosity insertion site. Review of Systems No current cardiopulmonary GI/GU issues  Objective: Vital Signs: BP 95/66   Pulse 86   Ht 5' 1"  (1.549 m)   Wt 170 lb (77.1 kg)   LMP 11/11/2010   BMI 32.12 kg/m   Physical Exam HENT:     Head: Normocephalic and atraumatic.     Nose:  Nose normal.  Eyes:     Extraocular Movements: Extraocular movements intact.  Musculoskeletal:     Comments: Left shoulder some limitation range of motion due to pain.  Positive impingement test.  Negative drop arm test.  Pain and weakness with supraspinatus resistance.  Neurological:     Mental Status: She is alert and oriented to person, place, and time.  Psychiatric:        Mood and Affect: Mood normal.     Ortho Exam  Specialty Comments:  No specialty comments available.  Imaging: No results found.   PMFS History: Patient Active Problem List   Diagnosis Date Noted   Status post left knee replacement 06/01/2021   Status post total left knee replacement 05/31/2021   Unilateral primary osteoarthritis, left knee 12/21/2020   Morbid obesity with body mass index (BMI) of 45.0 to 49.9 in adult [facility]) 07/06/2018   Type 2 diabetes mellitus without complication, without long-term current use of insulin ([place]) 05/22/2017   Class 3 obesity with serious comorbidity and body mass index (BMI) of 40.0 to 44.9 in adult 05/21/2017   Family history of colon cancer 04/01/2017   Irritable bowel syndrome with diarrhea 04/01/2017   NSAID long-term use 04/01/2017   Lymphocytosis 02/24/2017  Essential hypertension 02/12/2017   Mild intermittent asthma with acute exacerbation 12/12/2016   Left shoulder pain 06/02/2015   Insomnia 05/12/2014   Fibromyalgia 05/12/2014   Allergic rhinitis 03/28/2014   Osteoarthritis 12/20/2013   Cervical strain 06/21/2013   Low back pain 06/21/2013   Metatarsalgia of both feet 03/11/2013   Sciatica 01/25/2013   Restless leg syndrome 11/24/2012   Pure hypercholesterolemia 10/20/2012   OSA on CPAP 10/20/2012   Depression 10/20/2012   Iron deficiency anemia, unspecified 10/01/2012   Snoring 10/01/2012   Diabetes mellitus, type 2 ([place]) 12/31/2010   ANAL FISSURE 12/31/2010   Morbid obesity ([place]) 01/08/2007   CONSTIPATION 01/08/2007   Past Medical History:   Diagnosis Date   Allergic rhinitis, cause unspecified    Anemia    Arthritis    Asthma    Back pain    Chest pain    Constipation    CTS (carpal tunnel syndrome)    Cystocele    Depression    Diabetes mellitus    Dysfunction of eustachian tube    Fatty liver    Fibromyalgia    Fissure, anal    Gallbladder problem    Genital herpes    GERD (gastroesophageal reflux disease)    Headache    Hemorrhoid    Hx of migraine headaches    Hyperlipidemia    IBS (irritable bowel syndrome)    Insomnia    Iron deficiency anemia, unspecified    Joint pain    Nausea    Obesity    OSA on CPAP    Osteoarthritis    Rectocele    Sleep apnea    TMJ (dislocation of temporomandibular joint)     Family History  Problem Relation Age of Onset   Hypertension Mother 68   Osteoarthritis Mother    Diabetes Mother    Colon cancer Mother 29   Cancer Mother 39       colon cancer   Hyperlipidemia Mother    Obesity Mother    Sudden death Mother    Arthritis Mother    Colon polyps Brother        x 2 brother   Cirrhosis Brother        alcoholism   Alcohol abuse Brother    Sleep apnea Brother    Hyperlipidemia Brother    Migraines Brother    Obstructive Sleep Apnea Brother    Colon cancer Paternal Aunt    Stroke Maternal Grandmother    Colon cancer Paternal Grandmother    Cancer Paternal Grandmother        colon cancer   Diabetes Daughter    Hypertension Daughter    Sleep apnea Daughter    Mental illness Daughter        depression   Migraines Daughter    Migraines Daughter    Mental illness Daughter        anxiety attacks   Protein S deficiency Daughter    Esophageal cancer Neg Hx    Stomach cancer Neg Hx    Pancreatic cancer Neg Hx     Past Surgical History:  Procedure Laterality Date   ABDOMINAL HYSTERECTOMY  11/11/2010   [place] Plan.  Ovaries intact.  Uterine fibroids with DUB.   CARPAL TUNNEL RELEASE  11/12/1987   Left   CHOLECYSTECTOMY     CHOLECYSTECTOMY      COLONOSCOPY  06/29/2012   normal.  Repeat in 5 years.   DILATION AND CURETTAGE OF UTERUS     ECTOPIC PREGNANCY  SURGERY  11/12/1991   ESOPHAGOGASTRODUODENOSCOPY  12/13/2011   dysphagia.  Henrene Pastor.  Normal.   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  CPAP titration at 12 cm water pressure.   TOTAL KNEE ARTHROPLASTY Left 05/31/2021   Procedure: LEFT TOTAL KNEE ARTHROPLASTY;  Surgeon: Mcarthur Rossetti, MD;  Location: [facility];  Service: Orthopedics;  Laterality: Left;   TUBAL LIGATION  11/11/1986   Social History   Occupational History   Occupation: Audiological scientist: [employer]    Comment: cma/[employer]  Tobacco Use   Smoking status: Former    Types: Cigarettes   Smokeless tobacco: Never  Vaping Use   Vaping Use: Never used  Substance and Sexual Activity   Alcohol use: No    Alcohol/week: 1.0 standard drink of alcohol    Types: 1 Glasses of wine per week   Drug use: No   Sexual activity: Not Currently    Birth control/protection: None

## 2022-08-09 DIAGNOSIS — F331 Major depressive disorder, recurrent, moderate: Secondary | ICD-10-CM | POA: Diagnosis not present

## 2022-08-09 DIAGNOSIS — E7800 Pure hypercholesterolemia, unspecified: Secondary | ICD-10-CM | POA: Diagnosis not present

## 2022-08-09 DIAGNOSIS — Z9884 Bariatric surgery status: Secondary | ICD-10-CM | POA: Diagnosis not present

## 2022-08-09 DIAGNOSIS — E669 Obesity, unspecified: Secondary | ICD-10-CM | POA: Diagnosis not present

## 2022-08-09 DIAGNOSIS — E119 Type 2 diabetes mellitus without complications: Secondary | ICD-10-CM | POA: Diagnosis not present

## 2022-08-12 ENCOUNTER — Other Ambulatory Visit (HOSPITAL_COMMUNITY): Payer: Self-pay

## 2022-08-12 ENCOUNTER — Other Ambulatory Visit: Payer: Self-pay | Admitting: *Deleted

## 2022-08-12 ENCOUNTER — Other Ambulatory Visit: Payer: Self-pay

## 2022-08-12 ENCOUNTER — Ambulatory Visit: Payer: 59 | Attending: Cardiology

## 2022-08-12 DIAGNOSIS — Z9884 Bariatric surgery status: Secondary | ICD-10-CM

## 2022-08-12 DIAGNOSIS — I10 Essential (primary) hypertension: Secondary | ICD-10-CM | POA: Diagnosis not present

## 2022-08-12 DIAGNOSIS — E1169 Type 2 diabetes mellitus with other specified complication: Secondary | ICD-10-CM | POA: Diagnosis not present

## 2022-08-12 DIAGNOSIS — E7800 Pure hypercholesterolemia, unspecified: Secondary | ICD-10-CM

## 2022-08-12 DIAGNOSIS — E119 Type 2 diabetes mellitus without complications: Secondary | ICD-10-CM

## 2022-08-12 DIAGNOSIS — E785 Hyperlipidemia, unspecified: Secondary | ICD-10-CM | POA: Diagnosis not present

## 2022-08-12 NOTE — Progress Notes (Signed)
Orders received from patient's PCP to check LIPIDS, Direct LDL, and Urine Microalbumin w/ creatinine ratio. Orders have been placed.

## 2022-08-12 NOTE — Progress Notes (Signed)
Pt is an established Dr. Johney Frame pt.   Pts PCP always orders labs on the pt and okayed them being done by our office.  PCP ordered for the pt to get CMET, CBC W DIFF, and TSH level.  She also ordered Lipid Panel W/Reflex Direct Low Density Lipoprotein (LDL) Cholesterol, and Albumin/Creatinine Ratio, Random Urine.  Pt will double check with her PCP to ask if okay to change lipid order to regular lipid panel and the urine order to a regular urinalysis.  Pt will have all her other labs drawn today and she will call her PCP on the phone to inquire about the lipid and urine order, and get back with [name] accordingly thereafter.  Will order those accordingly and have them collected on pt, once her PCP advises.   Pt was gracious for all the assistance provided.

## 2022-08-13 ENCOUNTER — Ambulatory Visit: Payer: 59 | Attending: Cardiology

## 2022-08-13 DIAGNOSIS — E119 Type 2 diabetes mellitus without complications: Secondary | ICD-10-CM

## 2022-08-13 DIAGNOSIS — E7800 Pure hypercholesterolemia, unspecified: Secondary | ICD-10-CM

## 2022-08-13 LAB — CBC WITH DIFFERENTIAL/PLATELET
Basophils Absolute: 0.1 10*3/uL (ref 0.0–0.2)
Basos: 1 %
EOS (ABSOLUTE): 0.1 10*3/uL (ref 0.0–0.4)
Eos: 1 %
Hematocrit: 44.9 % (ref 34.0–46.6)
Hemoglobin: 14.6 g/dL (ref 11.1–15.9)
Immature Grans (Abs): 0 10*3/uL (ref 0.0–0.1)
Immature Granulocytes: 0 %
Lymphocytes Absolute: 5.9 10*3/uL — ABNORMAL HIGH (ref 0.7–3.1)
Lymphs: 53 %
MCH: 28.8 pg (ref 26.6–33.0)
MCHC: 32.5 g/dL (ref 31.5–35.7)
MCV: 89 fL (ref 79–97)
Monocytes Absolute: 0.6 10*3/uL (ref 0.1–0.9)
Monocytes: 6 %
Neutrophils Absolute: 4.3 10*3/uL (ref 1.4–7.0)
Neutrophils: 39 %
Platelets: 285 10*3/uL (ref 150–450)
RBC: 5.07 x10E6/uL (ref 3.77–5.28)
RDW: 13.7 % (ref 11.7–15.4)
WBC: 11 10*3/uL — ABNORMAL HIGH (ref 3.4–10.8)

## 2022-08-13 LAB — COMPREHENSIVE METABOLIC PANEL
ALT: 117 IU/L — ABNORMAL HIGH (ref 0–32)
AST: 69 IU/L — ABNORMAL HIGH (ref 0–40)
Albumin/Globulin Ratio: 2 (ref 1.2–2.2)
Albumin: 4.6 g/dL (ref 3.8–4.9)
Alkaline Phosphatase: 90 IU/L (ref 44–121)
BUN/Creatinine Ratio: 13 (ref 9–23)
BUN: 10 mg/dL (ref 6–24)
Bilirubin Total: 0.5 mg/dL (ref 0.0–1.2)
CO2: 23 mmol/L (ref 20–29)
Calcium: 9.2 mg/dL (ref 8.7–10.2)
Chloride: 106 mmol/L (ref 96–106)
Creatinine, Ser: 0.79 mg/dL (ref 0.57–1.00)
Globulin, Total: 2.3 g/dL (ref 1.5–4.5)
Glucose: 117 mg/dL — ABNORMAL HIGH (ref 70–99)
Potassium: 3.9 mmol/L (ref 3.5–5.2)
Sodium: 144 mmol/L (ref 134–144)
Total Protein: 6.9 g/dL (ref 6.0–8.5)
eGFR: 86 mL/min/{1.73_m2} (ref >59)

## 2022-08-13 LAB — TSH: TSH: 4.07 u[IU]/mL (ref 0.450–4.500)

## 2022-08-14 ENCOUNTER — Encounter: Payer: Self-pay | Admitting: Pharmacist

## 2022-08-14 ENCOUNTER — Other Ambulatory Visit (HOSPITAL_COMMUNITY): Payer: Self-pay

## 2022-08-14 ENCOUNTER — Telehealth: Payer: Self-pay | Admitting: *Deleted

## 2022-08-14 ENCOUNTER — Ambulatory Visit: Payer: 59 | Attending: Internal Medicine | Admitting: Pharmacist

## 2022-08-14 DIAGNOSIS — Z79899 Other long term (current) drug therapy: Secondary | ICD-10-CM

## 2022-08-14 DIAGNOSIS — R7989 Other specified abnormal findings of blood chemistry: Secondary | ICD-10-CM

## 2022-08-14 DIAGNOSIS — E7800 Pure hypercholesterolemia, unspecified: Secondary | ICD-10-CM

## 2022-08-14 DIAGNOSIS — E119 Type 2 diabetes mellitus without complications: Secondary | ICD-10-CM

## 2022-08-14 DIAGNOSIS — I700 Atherosclerosis of aorta: Secondary | ICD-10-CM

## 2022-08-14 DIAGNOSIS — Z789 Other specified health status: Secondary | ICD-10-CM

## 2022-08-14 DIAGNOSIS — E1169 Type 2 diabetes mellitus with other specified complication: Secondary | ICD-10-CM

## 2022-08-14 LAB — LIPID PANEL
Chol/HDL Ratio: 2.1 ratio (ref 0.0–4.4)
Cholesterol, Total: 123 mg/dL (ref 100–199)
HDL: 58 mg/dL (ref >39)
LDL Chol Calc (NIH): 45 mg/dL (ref 0–99)
Triglycerides: 113 mg/dL (ref 0–149)
VLDL Cholesterol Cal: 20 mg/dL (ref 5–40)

## 2022-08-14 LAB — MICROALBUMIN / CREATININE URINE RATIO
Creatinine, Urine: 89.1 mg/dL
Microalb/Creat Ratio: 9 mg/g creat (ref 0–29)
Microalbumin, Urine: 7.9 ug/mL

## 2022-08-14 LAB — LDL CHOLESTEROL, DIRECT: LDL Direct: 46 mg/dL (ref 0–99)

## 2022-08-14 MED ORDER — REPATHA SURECLICK 140 MG/ML ~~LOC~~ SOAJ
1.0000 mL | SUBCUTANEOUS | 3 refills | Status: DC
  Filled 2022-08-14: qty 6, 84d supply, fill #0
  Filled 2022-10-31: qty 6, 84d supply, fill #1
  Filled 2023-01-23: qty 6, 84d supply, fill #2
  Filled 2023-04-07: qty 6, 84d supply, fill #3

## 2022-08-14 MED ORDER — ROSUVASTATIN CALCIUM 10 MG PO TABS
10.0000 mg | ORAL_TABLET | Freq: Every day | ORAL | 1 refills | Status: DC
  Filled 2022-08-14: qty 90, 90d supply, fill #0

## 2022-08-14 NOTE — Telephone Encounter (Signed)
The patient has been notified of the result and verbalized understanding.  All questions (if any) were answered.  Pt made aware of lab results and recommendations per Dr. Johney Frame and [facility].   Pt aware to decrease her crestor to 10 mg po daily and come in for repeat LFTs in one month.   Pt also aware we will refer her to lipid clinic to be seen, for statin intolerance with elevated LFTs, and for consideration of PCSK9-Inhibitors.   Confirmed the pharmacy of choice with the pt.   Pt will come in for repeat LFTs in one month on 09/16/22.  Pt aware [facility] Scheduling dept will be reaching out to her to arrange her lipid clinic appt.   Pt verbalized understanding and agrees with this plan.

## 2022-08-14 NOTE — Progress Notes (Signed)
Patient ID: Cristina [name]                 DOB: [birth year]                    MRN: [id number]      HPI: Cristina [name] is a 61 y.o. female patient referred to lipid clinic by Dr Johney Frame. PMH is significant for HTN, OSA, T2DM, CAD, and elevated coronary calcium score. LDL is well controlled however LFTs increased on statins.  Patient previously on atorvastatin 39m however AST increased to 69 and ALT increased to 117. Was able to tolerate simvastatin and atorvastatin 212mwithout increase in LFTs . Was prescribed rosuvastatin 10100moday but has not picked up yet.  Blood sugar has been well controlled even after discontining metformin. Has trouble finding time to exercise due to her work schedule. Goes to second job after leaving current one.   Quit smoking recently.   Current Medications: N/A  Risk Factors:  CAD Elevated CAC T2DM  LDL goal: <55  Labs: TC 123, Trigs 113, HDL 58, LDL 45, Direct LDL 46 (08/13/22)  Coronary calcium score of 334. This was 99th percentile for age, gender, and race matched controls  Past Medical History:  Diagnosis Date   Allergic rhinitis, cause unspecified    Anemia    Arthritis    Asthma    Back pain    Chest pain    Constipation    CTS (carpal tunnel syndrome)    Cystocele    Depression    Diabetes mellitus    Dysfunction of eustachian tube    Fatty liver    Fibromyalgia    Fissure, anal    Gallbladder problem    Genital herpes    GERD (gastroesophageal reflux disease)    Headache    Hemorrhoid    Hx of migraine headaches    Hyperlipidemia    IBS (irritable bowel syndrome)    Insomnia    Iron deficiency anemia, unspecified    Joint pain    Nausea    Obesity    OSA on CPAP    Osteoarthritis    Rectocele    Sleep apnea    TMJ (dislocation of temporomandibular joint)     Current Outpatient Medications on File Prior to Visit  Medication Sig Dispense Refill   acyclovir (ZOVIRAX) 400 MG tablet Take 1 tablet (400 mg total) by mouth  2 (two) times daily. 180 tablet 4   albuterol (VENTOLIN HFA) 108 (90 Base) MCG/ACT inhaler Inhale 2 puffs into the lungs every 6 (six) hours as needed for wheezing 54 g 1   ANUCORT-HC 25 MG suppository PLACE 1 SUPPOSITORY RECTALLY 2 TIMES DAILY AS NEEDED FOR HEMORRHOIDS. 25 suppository 5   azelastine (ASTELIN) 0.1 % nasal spray PLACE 2 SPRAYS INTO BOTH NOSTRILS 2 TIMES DAILY AS DIRECTED 90 mL 3   azelastine (OPTIVAR) 0.05 % ophthalmic solution Place 1 drop into both eyes 2 (two) times daily as needed. 18 mL 4   Blood Glucose Monitoring Suppl (FREESTYLE LITE) w/Device KIT use to test blood sugar once daily 1 kit 0   buPROPion (WELLBUTRIN) 100 MG tablet Take 1 tablet (100 mg total) by mouth 2 (two) times daily 180 tablet 4   calcium carbonate (OS-CAL) 1250 (500 Ca) MG chewable tablet Chew 1 tablet by mouth daily.     cetirizine (ZYRTEC) 10 MG tablet Take 1 tablet (10 mg total) by mouth once daily 90 tablet 4  diltiazem 2 % GEL Apply 1 application topically 3 (three) times daily. (Patient taking differently: Apply 1 application  topically 3 (three) times daily as needed (anal fissure).) 1 Package 3   empagliflozin (JARDIANCE) 25 MG TABS tablet Take 1 tablet (25 mg total) by mouth daily with breakfast. 90 tablet 4   escitalopram (LEXAPRO) 10 MG tablet TAKE 1 TABLET BY MOUTH ONCE DAILY 90 tablet 4   escitalopram (LEXAPRO) 10 MG tablet Take 1 tablet (10 mg total) by mouth once daily 90 tablet 1   esomeprazole (NEXIUM) 40 MG capsule Take 1 capsule (40 mg total) by mouth daily. 90 capsule 4   fluticasone (FLONASE) 50 MCG/ACT nasal spray Place 2 sprays into the nose daily.     gabapentin (NEURONTIN) 300 MG capsule Take 1 capsule (300 mg total) by mouth at bedtime. 90 capsule 4   glucose blood (FREESTYLE LITE) test strip use 1 strip to test blood sugar once daily 100 each 3   glucose blood test strip Use as directed 100 each 1   Lancets (FREESTYLE) lancets use 1 lancet to test blood sugar once daily 100  each 4   Multiple Vitamins-Minerals (MULTIVITAMIN WITH MINERALS) tablet Take 1 tablet by mouth daily.     Semaglutide-Weight Management 2.4 MG/0.75ML SOAJ Inject 2.4 mg into the skin once a week. 3 mL 11   TRUEPLUS LANCETS 30G MISC USE TO CHECK BLOOD GLUCOSE DAILY AS DIRECTED 100 each 3   No current facility-administered medications on file prior to visit.    Allergies  Allergen Reactions   Atorvastatin Other (See Comments)    Pt reports causes muscle aches   Fish Oil Rash    Assessment/Plan:  1. Hyperlipidemia - Patient LDL well controlled at 46 which is at goal of <55. However due to LFT elevation, will d/c statins at this time. Due to elevated coronary calcium score, would benefit from PCSK9i.  Using demo pen, educated patient on mechanism of action, storage, site selection, administration, and possible adverse effects. Will complete PA and contact patient when approved. Advised patient to download copay card.  Recheck lipid panel in 2-3 months.  D/c statins at this time Start Repatha/Praluent q 14 days Recheck lipid panel in 2-3 months  Karren Cobble, PharmD, [place] [place], [place], [place], [address] Phone: [phone], Fax: [phone]

## 2022-08-14 NOTE — Patient Instructions (Signed)
We would like your LDL (bad cholesterol) to stay less than 55  Please discontinue your rosuvastatin at this time  We will start a new medication called Repatha or Praluent, both of which are given once every 2 weeks  I will complete the prior authorization for you and contact you when it is approved  I recommend downloading a copay card to help with cost  We will recheck your cholesterol in 2-3 months  Please let me know if you have any questions  Karren Cobble, PharmD, BCACP, CDCES, [place] [address], [place], [place] [address] Phone: [phone]; Fax: [phone] 08/14/2022 1:59 PM

## 2022-08-14 NOTE — Telephone Encounter (Signed)
-----   Message from Ramond Dial, [place] sent at 08/13/2022  8:26 AM EDT ----- Lets see what her lipid panel shows, but in the mean time lets decrease rosuvastatin to 85m- repeat LFT in 1 month. We should be able to get her PCKS9i ----- Message ----- From: PFreada Bergeron MD Sent: 08/13/2022   8:13 AM EDT To: INuala Alpha LPN; MRamond Dial RPH-CPP  Looks like LFTs jumped on the higher dose statin. Melissa, we increeased her lipitor to 456mdue to ca score in the 300s (was on 2090mreviously). She did not tolerate the lipitor and so we changed her to crestor 6m69mhese LFTs are a reflection of this. Lipids are being done today. What do you think about PCSK9i for her?

## 2022-08-15 ENCOUNTER — Other Ambulatory Visit (HOSPITAL_COMMUNITY): Payer: Self-pay

## 2022-08-28 ENCOUNTER — Ambulatory Visit: Payer: 59 | Admitting: Surgery

## 2022-08-29 ENCOUNTER — Other Ambulatory Visit (HOSPITAL_COMMUNITY): Payer: Self-pay

## 2022-08-29 MED ORDER — FLUTICASONE PROPIONATE 50 MCG/ACT NA SUSP
2.0000 | Freq: Every day | NASAL | 3 refills | Status: DC
  Filled 2022-08-29: qty 48, 90d supply, fill #0
  Filled 2022-11-29: qty 48, 90d supply, fill #1
  Filled 2023-02-25: qty 48, 90d supply, fill #2
  Filled 2023-08-24: qty 48, 90d supply, fill #3

## 2022-08-29 MED ORDER — BUPROPION HCL 100 MG PO TABS
100.0000 mg | ORAL_TABLET | Freq: Two times a day (BID) | ORAL | 3 refills | Status: DC
  Filled 2022-08-29: qty 180, 90d supply, fill #0
  Filled 2023-03-22: qty 180, 90d supply, fill #1
  Filled 2023-06-20: qty 180, 90d supply, fill #2

## 2022-08-29 MED ORDER — JARDIANCE 25 MG PO TABS
25.0000 mg | ORAL_TABLET | Freq: Every day | ORAL | 3 refills | Status: DC
  Filled 2022-08-29: qty 90, 90d supply, fill #0
  Filled 2023-04-20 – 2023-05-12 (×3): qty 90, 90d supply, fill #1

## 2022-08-29 MED ORDER — NITROGLYCERIN 0.4 MG SL SUBL
SUBLINGUAL_TABLET | SUBLINGUAL | 1 refills | Status: AC
  Filled 2022-08-29: qty 25, 8d supply, fill #0

## 2022-08-29 MED ORDER — CETIRIZINE HCL 10 MG PO TABS
10.0000 mg | ORAL_TABLET | Freq: Every day | ORAL | 3 refills | Status: AC
  Filled 2022-08-29 – 2022-12-25 (×2): qty 90, 90d supply, fill #0

## 2022-08-30 ENCOUNTER — Ambulatory Visit
Admission: RE | Admit: 2022-08-30 | Discharge: 2022-08-30 | Disposition: A | Discharge status: Home / Self Care | Payer: 59 | Source: Ambulatory Visit | Attending: Surgery | Admitting: Surgery

## 2022-08-30 DIAGNOSIS — M7542 Impingement syndrome of left shoulder: Secondary | ICD-10-CM

## 2022-08-30 DIAGNOSIS — M7530 Calcific tendinitis of unspecified shoulder: Secondary | ICD-10-CM

## 2022-08-30 DIAGNOSIS — S46012A Strain of muscle(s) and tendon(s) of the rotator cuff of left shoulder, initial encounter: Secondary | ICD-10-CM | POA: Diagnosis not present

## 2022-09-02 ENCOUNTER — Ambulatory Visit (INDEPENDENT_AMBULATORY_CARE_PROVIDER_SITE_OTHER): Payer: 59 | Admitting: Orthopedic Surgery

## 2022-09-02 ENCOUNTER — Ambulatory Visit: Payer: Self-pay

## 2022-09-02 ENCOUNTER — Encounter: Payer: Self-pay | Admitting: Surgery

## 2022-09-02 VITALS — Ht 61.0 in | Wt 170.0 lb

## 2022-09-02 DIAGNOSIS — M25512 Pain in left shoulder: Secondary | ICD-10-CM

## 2022-09-02 DIAGNOSIS — M7522 Bicipital tendinitis, left shoulder: Secondary | ICD-10-CM

## 2022-09-03 ENCOUNTER — Encounter: Payer: Self-pay | Admitting: Orthopedic Surgery

## 2022-09-03 ENCOUNTER — Other Ambulatory Visit (HOSPITAL_COMMUNITY): Payer: Self-pay

## 2022-09-05 ENCOUNTER — Encounter: Payer: Self-pay | Admitting: Orthopedic Surgery

## 2022-09-05 MED ORDER — BUPIVACAINE HCL 0.5 % IJ SOLN
9.0000 mL | INTRAMUSCULAR | Status: AC | PRN
  Administered 2022-09-02: 9 mL via INTRA_ARTICULAR

## 2022-09-05 MED ORDER — LIDOCAINE HCL 1 % IJ SOLN
5.0000 mL | INTRAMUSCULAR | Status: AC | PRN
  Administered 2022-09-02: 5 mL

## 2022-09-05 MED ORDER — METHYLPREDNISOLONE ACETATE 40 MG/ML IJ SUSP
40.0000 mg | INTRAMUSCULAR | Status: AC | PRN
  Administered 2022-09-02: 40 mg via INTRA_ARTICULAR

## 2022-09-05 NOTE — Progress Notes (Signed)
Office Visit Note   Patient: Cristina [name]           Date of Birth: 13-Feb-1961           MRN: [id number] Visit Date: 09/02/2022 Requested by: [name], MD [place],  [place] [id number] PCP: [name], MD  Subjective: Chief Complaint  Patient presents with   Left Shoulder - Follow-up    HPI: Cristina [name] is a 61 y.o. female who presents to the office reporting left shoulder pain of many months duration.  Denies any discrete history of injury.  Has had a subacromial injection in earlier which did not give her much relief.  Describes pain in the shoulder region without much radiculopathy and no significant neck pain.  Patient still works..                ROS: All systems reviewed are negative as they relate to the chief complaint within the history of present illness.  Patient denies fevers or chills.  Assessment & Plan: Visit Diagnoses:  1. Acute pain of left shoulder     Plan: Impression is left shoulder pain with MRI scan showing calcific tendinitis along with AC joint arthropathy and severe biceps tendinosis.  Patient may also have a component of adhesive capsulitis.  I think the biggest pain generator most likely is that biceps tendon in her shoulder.  Plan is ultrasound-guided intra-articular injection today with follow-up in 3 weeks with decision for or against surgical intervention based on response to that injection.  I think the [facility] joint could also be a pain generator.  Rotator cuff pathology less likely but also in the differential.  Follow-Up Instructions: No follow-ups on file.   Orders:  Orders Placed This Encounter  Procedures   US Guided Needle Placement - No Linked Charges   No orders of the defined types were placed in this encounter.     Procedures: Large Joint Inj: L glenohumeral on 09/02/2022 7:05 AM Indications: diagnostic evaluation and pain Details: 18 G 1.5 in needle, ultrasound-guided posterior approach  Arthrogram:  No  Medications: 9 mL bupivacaine 0.5 %; 40 mg methylPREDNISolone acetate 40 MG/ML; 5 mL lidocaine 1 % Outcome: tolerated well, no immediate complications Procedure, treatment alternatives, risks and benefits explained, specific risks discussed. Consent was given by the patient. Immediately prior to procedure a time out was called to verify the correct patient, procedure, equipment, support staff and site/side marked as required. Patient was prepped and draped in the usual sterile fashion.       Clinical Data: No additional findings.  Objective: Vital Signs: Ht 5' 1"  (1.549 m)   Wt 170 lb (77.1 kg)   LMP 11/11/2010   BMI 32.12 kg/m   Physical Exam:  Constitutional: Patient appears well-developed HEENT:  Head: Normocephalic Eyes:EOM are normal Neck: Normal range of motion Cardiovascular: Normal rate Pulmonary/chest: Effort normal Neurologic: Patient is alert Skin: Skin is warm Psychiatric: Patient has normal mood and affect  Ortho Exam: Ortho exam demonstrates mild tenderness AC joint left versus right as well as pain with crossarm adduction.  Patient does have positive O'Brien's testing and speeds testing on the left compared to the right.  No restriction of external rotation of 15 degrees of abduction.  Not too much in the way of coarse grinding or crepitus in the left shoulder with internal/external rotation at 90 degrees of abduction.  Specialty Comments:  No specialty comments available.  Imaging: No results found.  PMFS History: Patient Active Problem List   Diagnosis Date Noted   Aortic atherosclerosis ([place]) 08/14/2022   Status post left knee replacement 06/01/2021   Status post total left knee replacement 05/31/2021   Presence of left artificial knee joint 05/31/2021   Unilateral primary osteoarthritis, left knee 12/21/2020   S/P gastric bypass 08/01/2018   Morbid obesity with body mass index (BMI) of 45.0 to 49.9 in adult [facility]) 07/06/2018   Type 2 diabetes  mellitus without complication, without long-term current use of insulin ([place]) 05/22/2017   Class 3 obesity with serious comorbidity and body mass index (BMI) of 40.0 to 44.9 in adult 05/21/2017   Family history of colon cancer 04/01/2017   Irritable bowel syndrome with diarrhea 04/01/2017   NSAID long-term use 04/01/2017   Lymphocytosis 02/24/2017   Essential hypertension 02/12/2017   Mild intermittent asthma with acute exacerbation 12/12/2016   Left shoulder pain 06/02/2015   Insomnia 05/12/2014   Fibromyalgia 05/12/2014   Allergic rhinitis 03/28/2014   Osteoarthritis 12/20/2013   Primary osteoarthritis of both knees 12/20/2013   Cervical strain 06/21/2013   Low back pain 06/21/2013   Metatarsalgia of both feet 03/11/2013   Sciatica 01/25/2013   Restless leg syndrome 11/24/2012   Pure hypercholesterolemia 10/20/2012   OSA on CPAP 10/20/2012   Depression 10/20/2012   Iron deficiency anemia, unspecified 10/01/2012   Snoring 10/01/2012   Diabetes mellitus, type 2 ([place]) 12/31/2010   ANAL FISSURE 12/31/2010   Morbid obesity ([place]) 01/08/2007   CONSTIPATION 01/08/2007   Past Medical History:  Diagnosis Date   Allergic rhinitis, cause unspecified    Anemia    Arthritis    Asthma    Back pain    Chest pain    Constipation    CTS (carpal tunnel syndrome)    Cystocele    Depression    Diabetes mellitus    Dysfunction of eustachian tube    Fatty liver    Fibromyalgia    Fissure, anal    Gallbladder problem    Genital herpes    GERD (gastroesophageal reflux disease)    Headache    Hemorrhoid    Hx of migraine headaches    Hyperlipidemia    IBS (irritable bowel syndrome)    Insomnia    Iron deficiency anemia, unspecified    Joint pain    Nausea    Obesity    OSA on CPAP    Osteoarthritis    Rectocele    Sleep apnea    TMJ (dislocation of temporomandibular joint)     Family History  Problem Relation Age of Onset   Hypertension Mother 54   Osteoarthritis Mother     Diabetes Mother    Colon cancer Mother 70   Cancer Mother 60       colon cancer   Hyperlipidemia Mother    Obesity Mother    Sudden death Mother    Arthritis Mother    Colon polyps Brother        x 2 brother   Cirrhosis Brother        alcoholism   Alcohol abuse Brother    Sleep apnea Brother    Hyperlipidemia Brother    Migraines Brother    Obstructive Sleep Apnea Brother    Colon cancer Paternal Aunt    Stroke Maternal Grandmother    Colon cancer Paternal Grandmother    Cancer Paternal Grandmother        colon cancer   Diabetes Daughter    Hypertension  Daughter    Sleep apnea Daughter    Mental illness Daughter        depression   Migraines Daughter    Migraines Daughter    Mental illness Daughter        anxiety attacks   Protein S deficiency Daughter    Esophageal cancer Neg Hx    Stomach cancer Neg Hx    Pancreatic cancer Neg Hx     Past Surgical History:  Procedure Laterality Date   ABDOMINAL HYSTERECTOMY  11/11/2010   [place] Plan.  Ovaries intact.  Uterine fibroids with DUB.   CARPAL TUNNEL RELEASE  11/12/1987   Left   CHOLECYSTECTOMY     CHOLECYSTECTOMY     COLONOSCOPY  06/29/2012   normal.  Repeat in 5 years.   DILATION AND CURETTAGE OF UTERUS     ECTOPIC PREGNANCY SURGERY  11/12/1991   ESOPHAGOGASTRODUODENOSCOPY  12/13/2011   dysphagia.  Henrene Pastor.  Normal.   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  CPAP titration at 12 cm water pressure.   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

## 2022-09-09 ENCOUNTER — Other Ambulatory Visit (HOSPITAL_COMMUNITY): Payer: Self-pay

## 2022-09-09 NOTE — Telephone Encounter (Signed)
Glenohumeral injection helped for 4 days, I would say she should come back to either discuss surgical intervention or could consider bicep tendon sheath injection, depending on what Dr Marlou Sa wants to do. I didn't see her that day

## 2022-09-10 ENCOUNTER — Telehealth: Payer: Self-pay | Admitting: Pharmacist

## 2022-09-10 ENCOUNTER — Other Ambulatory Visit (HOSPITAL_COMMUNITY): Payer: Self-pay

## 2022-09-10 NOTE — Telephone Encounter (Signed)
PA for [facility] approved through 09/10/23

## 2022-09-10 NOTE — Telephone Encounter (Signed)
PA for [facility] submitted. Key [id number]

## 2022-09-16 ENCOUNTER — Ambulatory Visit: Payer: 59 | Attending: Cardiology

## 2022-09-16 DIAGNOSIS — Z79899 Other long term (current) drug therapy: Secondary | ICD-10-CM | POA: Diagnosis not present

## 2022-09-16 DIAGNOSIS — Z789 Other specified health status: Secondary | ICD-10-CM | POA: Diagnosis not present

## 2022-09-16 DIAGNOSIS — E1169 Type 2 diabetes mellitus with other specified complication: Secondary | ICD-10-CM

## 2022-09-16 DIAGNOSIS — R7989 Other specified abnormal findings of blood chemistry: Secondary | ICD-10-CM | POA: Diagnosis not present

## 2022-09-16 DIAGNOSIS — E7800 Pure hypercholesterolemia, unspecified: Secondary | ICD-10-CM | POA: Diagnosis not present

## 2022-09-16 DIAGNOSIS — E785 Hyperlipidemia, unspecified: Secondary | ICD-10-CM | POA: Diagnosis not present

## 2022-09-16 DIAGNOSIS — E119 Type 2 diabetes mellitus without complications: Secondary | ICD-10-CM

## 2022-09-16 LAB — HEPATIC FUNCTION PANEL
ALT: 34 IU/L — ABNORMAL HIGH (ref 0–32)
AST: 23 IU/L (ref 0–40)
Albumin: 4.1 g/dL (ref 3.8–4.9)
Alkaline Phosphatase: 102 IU/L (ref 44–121)
Bilirubin Total: 0.4 mg/dL (ref 0.0–1.2)
Bilirubin, Direct: 0.1 mg/dL (ref 0.00–0.40)
Total Protein: 6 g/dL (ref 6.0–8.5)

## 2022-09-23 ENCOUNTER — Ambulatory Visit (INDEPENDENT_AMBULATORY_CARE_PROVIDER_SITE_OTHER): Payer: 59 | Admitting: Orthopedic Surgery

## 2022-09-23 DIAGNOSIS — M7522 Bicipital tendinitis, left shoulder: Secondary | ICD-10-CM | POA: Diagnosis not present

## 2022-09-23 DIAGNOSIS — M7542 Impingement syndrome of left shoulder: Secondary | ICD-10-CM

## 2022-09-24 ENCOUNTER — Telehealth: Payer: Self-pay | Admitting: Orthopedic Surgery

## 2022-09-24 NOTE — Telephone Encounter (Signed)
Patient calling to schedule left shoulder surgery with Dr. Marlou Sa.  Patient would like something the week of Dec 18th, 2023.  Was told to call Debbie when ready to schedule.  Please provide surgery sheet if surgery is in order and advise if any clearances are necessary prior to surgery.

## 2022-09-25 NOTE — Telephone Encounter (Signed)
Blue sheet done pls claal thx debbie

## 2022-09-26 ENCOUNTER — Encounter: Payer: Self-pay | Admitting: Orthopedic Surgery

## 2022-09-26 NOTE — Progress Notes (Signed)
Office Visit Note   Patient: Cristina [name]           Date of Birth: Oct 06, 1961           MRN: [id number] Visit Date: 09/23/2022 Requested by: [name], MD [place],  [place] [address] PCP: [name], MD  Subjective: Chief Complaint  Patient presents with   Left Shoulder - Pain, Follow-up    HPI: Cristina [name] is a 61 y.o. female who presents to the office reporting left shoulder pain.  Patient had glenohumeral joint injection 09/02/2022.  Has been able to get some relief from that but does report continued anterior shoulder pain.  The pain comes and goes and is worse with certain movements.  Does not wake her from sleep.  She also describes some mild AC joint tenderness.  MRI scan from 1020 demonstrates severe tendinosis of the long head of the biceps with moderate arthropathy AC joint.  Tenderness is also present in the rotator cuff tendons but no full-thickness tear is present..                ROS: All systems reviewed are negative as they relate to the chief complaint within the history of present illness.  Patient denies fevers or chills.  Assessment & Plan: Visit Diagnoses:  1. Biceps tendinitis of left upper extremity   2. Impingement syndrome of left shoulder     Plan: Impression is left shoulder pain with biceps tendinitis and possible AC joint arthropathy as well which is symptomatic.  Plan is left shoulder arthroscopy with debridement, biceps tenodesis, possible arthroscopic distal clavicle excision.  The risk and benefits of the procedure discussed with the patient include not limited to infection or vessel damage incomplete pain relief as well as potential for more surgery if the rotator cuff tear is down the road.  Patient understands risk benefits and wishes to proceed.  Follow-Up Instructions: No follow-ups on file.   Orders:  No orders of the defined types were placed in this encounter.  No orders of the defined types were placed in this  encounter.     Procedures: No procedures performed   Clinical Data: No additional findings.  Objective: Vital Signs: LMP 11/11/2010   Physical Exam:  Constitutional: Patient appears well-developed HEENT:  Head: Normocephalic Eyes:EOM are normal Neck: Normal range of motion Cardiovascular: Normal rate Pulmonary/chest: Effort normal Neurologic: Patient is alert Skin: Skin is warm Psychiatric: Patient has normal mood and affect  Ortho Exam: Ortho exam demonstrates maintenance of full passive range of motion left shoulder versus right.  Patient does have positive O'Brien's testing positive speeds testing as well as tenderness of the biceps tendon to direct palpation of the left shoulder.  AC joint tenderness is also present on the left compared to the right.  No coarse grinding or crepitus present with internal/external rotation of that left arm at 90 degrees of abduction.  Motor or sensory function of the hand is intact.  Cervical spine range of motion intact.  Specialty Comments:  No specialty comments available.  Imaging: No results found.   PMFS History: Patient Active Problem List   Diagnosis Date Noted   Aortic atherosclerosis ([place]) 08/14/2022   Status post left knee replacement 06/01/2021   Status post total left knee replacement 05/31/2021   Presence of left artificial knee joint 05/31/2021   Unilateral primary osteoarthritis, left knee 12/21/2020   S/P gastric bypass 08/01/2018   Morbid obesity with body mass index (  BMI) of 45.0 to 49.9 in adult [facility]) 07/06/2018   Type 2 diabetes mellitus without complication, without long-term current use of insulin ([place]) 05/22/2017   Class 3 obesity with serious comorbidity and body mass index (BMI) of 40.0 to 44.9 in adult 05/21/2017   Family history of colon cancer 04/01/2017   Irritable bowel syndrome with diarrhea 04/01/2017   NSAID long-term use 04/01/2017   Lymphocytosis 02/24/2017   Essential hypertension 02/12/2017    Mild intermittent asthma with acute exacerbation 12/12/2016   Left shoulder pain 06/02/2015   Insomnia 05/12/2014   Fibromyalgia 05/12/2014   Allergic rhinitis 03/28/2014   Osteoarthritis 12/20/2013   Primary osteoarthritis of both knees 12/20/2013   Cervical strain 06/21/2013   Low back pain 06/21/2013   Metatarsalgia of both feet 03/11/2013   Sciatica 01/25/2013   Restless leg syndrome 11/24/2012   Pure hypercholesterolemia 10/20/2012   OSA on CPAP 10/20/2012   Depression 10/20/2012   Iron deficiency anemia, unspecified 10/01/2012   Snoring 10/01/2012   Diabetes mellitus, type 2 ([place]) 12/31/2010   ANAL FISSURE 12/31/2010   Morbid obesity ([place]) 01/08/2007   CONSTIPATION 01/08/2007   Past Medical History:  Diagnosis Date   Allergic rhinitis, cause unspecified    Anemia    Arthritis    Asthma    Back pain    Chest pain    Constipation    CTS (carpal tunnel syndrome)    Cystocele    Depression    Diabetes mellitus    Dysfunction of eustachian tube    Fatty liver    Fibromyalgia    Fissure, anal    Gallbladder problem    Genital herpes    GERD (gastroesophageal reflux disease)    Headache    Hemorrhoid    Hx of migraine headaches    Hyperlipidemia    IBS (irritable bowel syndrome)    Insomnia    Iron deficiency anemia, unspecified    Joint pain    Nausea    Obesity    OSA on CPAP    Osteoarthritis    Rectocele    Sleep apnea    TMJ (dislocation of temporomandibular joint)     Family History  Problem Relation Age of Onset   Hypertension Mother 22   Osteoarthritis Mother    Diabetes Mother    Colon cancer Mother 33   Cancer Mother 27       colon cancer   Hyperlipidemia Mother    Obesity Mother    Sudden death Mother    Arthritis Mother    Colon polyps Brother        x 2 brother   Cirrhosis Brother        alcoholism   Alcohol abuse Brother    Sleep apnea Brother    Hyperlipidemia Brother    Migraines Brother    Obstructive Sleep Apnea Brother     Colon cancer Paternal Aunt    Stroke Maternal Grandmother    Colon cancer Paternal Grandmother    Cancer Paternal Grandmother        colon cancer   Diabetes Daughter    Hypertension Daughter    Sleep apnea Daughter    Mental illness Daughter        depression   Migraines Daughter    Migraines Daughter    Mental illness Daughter        anxiety attacks   Protein S deficiency Daughter    Esophageal cancer Neg Hx    Stomach cancer Neg Hx  Pancreatic cancer Neg Hx     Past Surgical History:  Procedure Laterality Date   ABDOMINAL HYSTERECTOMY  11/11/2010   [place] Plan.  Ovaries intact.  Uterine fibroids with DUB.   CARPAL TUNNEL RELEASE  11/12/1987   Left   CHOLECYSTECTOMY     CHOLECYSTECTOMY     COLONOSCOPY  06/29/2012   normal.  Repeat in 5 years.   DILATION AND CURETTAGE OF UTERUS     ECTOPIC PREGNANCY SURGERY  11/12/1991   ESOPHAGOGASTRODUODENOSCOPY  12/13/2011   dysphagia.  Henrene Pastor.  Normal.   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  CPAP titration at 12 cm water pressure.   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

## 2022-09-27 ENCOUNTER — Telehealth: Payer: Self-pay | Admitting: Orthopedic Surgery

## 2022-09-27 NOTE — Telephone Encounter (Signed)
[employer] forms received. To [employer]. 

## 2022-10-01 ENCOUNTER — Telehealth: Payer: Self-pay | Admitting: Orthopedic Surgery

## 2022-10-01 NOTE — Telephone Encounter (Signed)
Pt submitted medical release form and $25.00 cash payment to [employer]. Accepted 11/ 21/23. Pt states [employer] sent FMLA forms

## 2022-10-02 ENCOUNTER — Other Ambulatory Visit (HOSPITAL_COMMUNITY): Payer: Self-pay

## 2022-10-04 ENCOUNTER — Other Ambulatory Visit (HOSPITAL_COMMUNITY): Payer: Self-pay

## 2022-10-18 NOTE — Pre-Procedure Instructions (Signed)
Surgical Instructions    Your procedure is scheduled on Tuesday, October 29, 2022.  Report to [facility] Main Entrance "A" at 12:00 P.M., then check in with the Admitting office.  Call this number if you have problems the morning of surgery:  [phone]   If you have any questions prior to your surgery date call [phone]: Open Monday-Friday 8am-4pm If you experience any cold or flu symptoms such as cough, fever, chills, shortness of breath, etc. between now and your scheduled surgery, please notify us at the above number     Remember:  Do not eat after midnight the night before your surgery  You may drink clear liquids until 11:00 am the morning of your surgery.   Clear liquids allowed are: Water, Non-Citrus Juices (without pulp), Carbonated Beverages, Clear Tea, Black Coffee ONLY (NO MILK, CREAM OR POWDERED CREAMER of any kind), and Gatorade    Enhanced Recovery after Surgery for Orthopedics Enhanced Recovery after Surgery is a protocol used to improve the stress on your body and your recovery after surgery.  Patient Instructions   The day of surgery (if you have diabetes):  Drink ONE small 12 oz bottle of Gatorade 2 by 12:00 pm the day of surgery This bottle was given to you during your hospital  pre-op appointment visit.  Nothing else to drink after completing the  Small 12 oz bottle of Gatorade 2.         If you have questions, please contact your surgeon's office.    Take these medicines the morning of surgery with A SIP OF WATER:   acyclovir (ZOVIRAX)   buPROPion (WELLBUTRIN)   cetirizine (ZYRTEC)   escitalopram (LEXAPRO)   esomeprazole (NEXIUM)  fluticasone (FLONASE)   IF NEEDED: albuterol (VENTOLIN HFA) inhaler -please bring this with you the day of surgery  azelastine (OPTIVAR) 0.05 % ophthalmic solution-eye drops ANUCORT-HC 25 MG suppository  azelastine (ASTELIN)  nitroGLYCERIN (NITROSTAT)   As of today, STOP taking any Aspirin (unless otherwise  instructed by your surgeon) Aleve, Naproxen, Ibuprofen, Motrin, Advil, Goody's, BC's, all herbal medications, fish oil, and all vitamins.  WHAT DO I DO ABOUT MY DIABETES MEDICATION?  Please hold Semaglutide (Ozempic) 7 days prior to your surgery.   Please hold empagliflozin (JARDIANCE)  for 3 days prior to your surgery. Last dose on Friday 10/25/22.  Do not take oral diabetes medicines empagliflozin (JARDIANCE) pill the morning of surgery.   HOW TO MANAGE YOUR DIABETES BEFORE AND AFTER SURGERY  Why is it important to control my blood sugar before and after surgery? Improving blood sugar levels before and after surgery helps healing and can limit problems. A way of improving blood sugar control is eating a healthy diet by:  Eating less sugar and carbohydrates  Increasing activity/exercise  Talking with your doctor about reaching your blood sugar goals High blood sugars (greater than 180 mg/dL) can raise your risk of infections and slow your recovery, so you will need to focus on controlling your diabetes during the weeks before surgery. Make sure that the doctor who takes care of your diabetes knows about your planned surgery including the date and location.  How do I manage my blood sugar before surgery? Check your blood sugar at least 4 times a day, starting 2 days before surgery, to make sure that the level is not too high or low.  Check your blood sugar the morning of your surgery when you wake up and every 2 hours until you get to the Short  Stay unit.  If your blood sugar is less than 70 mg/dL, you will need to treat for low blood sugar: Do not take insulin. Treat a low blood sugar (less than 70 mg/dL) with  cup of clear juice (cranberry or apple), 4 glucose tablets, OR glucose gel. Recheck blood sugar in 15 minutes after treatment (to make sure it is greater than 70 mg/dL). If your blood sugar is not greater than 70 mg/dL on recheck, call [phone] for further  instructions. Report your blood sugar to the short stay nurse when you get to Short Stay.  If you are admitted to the hospital after surgery: Your blood sugar will be checked by the staff and you will probably be given insulin after surgery (instead of oral diabetes medicines) to make sure you have good blood sugar levels. The goal for blood sugar control after surgery is 80-180 mg/dL.           Do not wear jewelry or makeup. Do not wear lotions, powders, perfumes or deodorant. Do not shave 48 hours prior to surgery.  Do not bring valuables to the hospital. Do not wear nail polish, gel polish, artificial nails, or any other type of covering on natural nails (fingers and toes) If you have artificial nails or gel coating that need to be removed by a nail salon, please have this removed prior to surgery. Artificial nails or gel coating may interfere with anesthesia's ability to adequately monitor your vital signs.  [facility] is not responsible for any belongings or valuables.    Do NOT Smoke (Tobacco/Vaping)  24 hours prior to your procedure  If you use a CPAP at night, you may bring your mask for your overnight stay.   Contacts, glasses, hearing aids, dentures or partials may not be worn into surgery, please bring cases for these belongings   For patients admitted to the hospital, discharge time will be determined by your treatment team.   Patients discharged the day of surgery will not be allowed to drive home, and someone needs to stay with them for 24 hours.   SURGICAL WAITING ROOM VISITATION Patients having surgery or a procedure may have no more than 2 support people in the waiting area - these visitors may rotate.   Children under the age of 72 must have an adult with them who is not the patient. If the patient needs to stay at the hospital during part of their recovery, the visitor guidelines for inpatient rooms apply. Pre-op nurse will coordinate an appropriate time for 1  support person to accompany patient in pre-op.  This support person may not rotate.   Please refer to [email or web address] for the visitor guidelines for Inpatients (after your surgery is over and you are in a regular room).    Special instructions:    Oral Hygiene is also important to reduce your risk of infection.  Remember - BRUSH YOUR TEETH THE MORNING OF SURGERY WITH YOUR REGULAR TOOTHPASTE   [facility]- Preparing For Surgery  Before surgery, you can play an important role. Because skin is not sterile, your skin needs to be as free of germs as possible. You can reduce the number of germs on your skin by washing with CHG (chlorahexidine gluconate) Soap before surgery.  CHG is an antiseptic cleaner which kills germs and bonds with the skin to continue killing germs even after washing.     Please do not use if you have an allergy to CHG or antibacterial soaps. If your  skin becomes reddened/irritated stop using the CHG.  Do not shave (including legs and underarms) for at least 48 hours prior to first CHG shower. It is OK to shave your face.  Please follow these instructions carefully.     Shower the NIGHT BEFORE SURGERY and the MORNING OF SURGERY with CHG Soap.   If you chose to wash your hair, wash your hair first as usual with your normal shampoo. After you shampoo, rinse your hair and body thoroughly to remove the shampoo.  Then [employer] and genitals (private parts) with your normal soap and rinse thoroughly to remove soap.  After that Use CHG Soap as you would any other liquid soap. You can apply CHG directly to the skin and wash gently with a scrungie or a clean washcloth.   Apply the CHG Soap to your body ONLY FROM THE NECK DOWN.  Do not use on open wounds or open sores. Avoid contact with your eyes, ears, mouth and genitals (private parts). Wash Face and genitals (private parts)  with your normal soap.   Wash thoroughly, paying  special attention to the area where your surgery will be performed.  Thoroughly rinse your body with warm water from the neck down.  DO NOT shower/wash with your normal soap after using and rinsing off the CHG Soap.  Pat yourself dry with a CLEAN TOWEL.  Wear CLEAN PAJAMAS to bed the night before surgery  Place CLEAN SHEETS on your bed the night before your surgery  DO NOT SLEEP WITH PETS.   Day of Surgery:  Take a shower with CHG soap. Wear Clean/Comfortable clothing the morning of surgery Do not apply any deodorants/lotions.   Remember to brush your teeth WITH YOUR REGULAR TOOTHPASTE.    If you received a COVID test during your pre-op visit, it is requested that you wear a mask when out in public, stay away from anyone that may not be feeling well, and notify your surgeon if you develop symptoms. If you have been in contact with anyone that has tested positive in the last 10 days, please notify your surgeon.    Please read over the following fact sheets that you were given.

## 2022-10-21 ENCOUNTER — Other Ambulatory Visit: Payer: Self-pay

## 2022-10-21 ENCOUNTER — Encounter (HOSPITAL_COMMUNITY)
Admission: RE | Admit: 2022-10-21 | Discharge: 2022-10-21 | Disposition: A | Discharge status: Home / Self Care | Payer: 59 | Source: Ambulatory Visit | Attending: Orthopedic Surgery | Admitting: Orthopedic Surgery

## 2022-10-21 ENCOUNTER — Encounter (HOSPITAL_COMMUNITY): Payer: Self-pay

## 2022-10-21 VITALS — BP 107/69 | HR 70 | Temp 97.5°F | Resp 18 | Ht 60.0 in | Wt 165.6 lb

## 2022-10-21 DIAGNOSIS — Z01818 Encounter for other preprocedural examination: Secondary | ICD-10-CM

## 2022-10-21 DIAGNOSIS — K760 Fatty (change of) liver, not elsewhere classified: Secondary | ICD-10-CM | POA: Insufficient documentation

## 2022-10-21 DIAGNOSIS — Z01812 Encounter for preprocedural laboratory examination: Secondary | ICD-10-CM | POA: Insufficient documentation

## 2022-10-21 DIAGNOSIS — E119 Type 2 diabetes mellitus without complications: Secondary | ICD-10-CM | POA: Diagnosis not present

## 2022-10-21 HISTORY — DX: Family history of other specified conditions: Z84.89

## 2022-10-21 LAB — COMPREHENSIVE METABOLIC PANEL
ALT: 21 U/L (ref 0–44)
AST: 21 U/L (ref 15–41)
Albumin: 3.5 g/dL (ref 3.5–5.0)
Alkaline Phosphatase: 69 U/L (ref 38–126)
Anion gap: 7 (ref 5–15)
BUN: 11 mg/dL (ref 6–20)
CO2: 23 mmol/L (ref 22–32)
Calcium: 8.6 mg/dL — ABNORMAL LOW (ref 8.9–10.3)
Chloride: 110 mmol/L (ref 98–111)
Creatinine, Ser: 0.74 mg/dL (ref 0.44–1.00)
GFR, Estimated: >60 mL/min (ref >60)
Glucose, Bld: 92 mg/dL (ref 70–99)
Potassium: 3.7 mmol/L (ref 3.5–5.1)
Sodium: 140 mmol/L (ref 135–145)
Total Bilirubin: 0.4 mg/dL (ref 0.3–1.2)
Total Protein: 6.3 g/dL — ABNORMAL LOW (ref 6.5–8.1)

## 2022-10-21 LAB — CBC
HCT: 41.8 % (ref 36.0–46.0)
Hemoglobin: 13.5 g/dL (ref 12.0–15.0)
MCH: 29 pg (ref 26.0–34.0)
MCHC: 32.3 g/dL (ref 30.0–36.0)
MCV: 89.9 fL (ref 80.0–100.0)
Platelets: 275 10*3/uL (ref 150–400)
RBC: 4.65 MIL/uL (ref 3.87–5.11)
RDW: 13.7 % (ref 11.5–15.5)
WBC: 9.3 10*3/uL (ref 4.0–10.5)
nRBC: 0 % (ref 0.0–0.2)

## 2022-10-21 LAB — GLUCOSE, CAPILLARY: Glucose-Capillary: 114 mg/dL — ABNORMAL HIGH (ref 70–99)

## 2022-10-21 NOTE — Pre-Procedure Instructions (Signed)
Surgical Instructions    Your procedure is scheduled on Tuesday, October 29, 2022.  Report to [facility] Main Entrance "A" at 12:00 P.M., then check in with the Admitting office.  Call this number if you have problems the morning of surgery:  [phone]   If you have any questions prior to your surgery date call [phone]: Open Monday-Friday 8am-4pm If you experience any cold or flu symptoms such as cough, fever, chills, shortness of breath, etc. between now and your scheduled surgery, please notify us at the above number     Remember:  Do not eat after midnight the night before your surgery  You may drink clear liquids until 11:00 am the morning of your surgery.   Clear liquids allowed are: Water, Non-Citrus Juices (without pulp), Carbonated Beverages, Clear Tea, Black Coffee ONLY (NO MILK, CREAM OR POWDERED CREAMER of any kind), and Gatorade    Enhanced Recovery after Surgery for Orthopedics Enhanced Recovery after Surgery is a protocol used to improve the stress on your body and your recovery after surgery.  Patient Instructions   The day of surgery (if you have diabetes):  Drink ONE small 12 oz bottle of Gatorade 2 by 12:00 pm the day of surgery This bottle was given to you during your hospital  pre-op appointment visit.  Nothing else to drink after completing the  Small 12 oz bottle of Gatorade 2.         If you have questions, please contact your surgeon's office.    Take these medicines the morning of surgery with A SIP OF WATER:   acyclovir (ZOVIRAX)   buPROPion (WELLBUTRIN)   cetirizine (ZYRTEC)   escitalopram (LEXAPRO)   esomeprazole (NEXIUM)  fluticasone (FLONASE)   IF NEEDED: albuterol (VENTOLIN HFA) inhaler -please bring this with you the day of surgery  azelastine (OPTIVAR) 0.05 % ophthalmic solution-eye drops ANUCORT-HC 25 MG suppository  azelastine (ASTELIN)    As of today, STOP taking any Aspirin (unless otherwise instructed by your surgeon)  Aleve, Naproxen, Ibuprofen, Motrin, Advil, Goody's, BC's, all herbal medications, fish oil, and all vitamins.  WHAT DO I DO ABOUT MY DIABETES MEDICATION?  Please hold Semaglutide (Ozempic) 7 days prior to your surgery. (DO NOT take after 10/21/22)  Please hold empagliflozin (JARDIANCE)  for 3 days prior to your surgery. Last dose on Friday 10/25/22.  Do not take oral diabetes medicines empagliflozin (JARDIANCE) pill the morning of surgery.   HOW TO MANAGE YOUR DIABETES BEFORE AND AFTER SURGERY  Why is it important to control my blood sugar before and after surgery? Improving blood sugar levels before and after surgery helps healing and can limit problems. A way of improving blood sugar control is eating a healthy diet by:  Eating less sugar and carbohydrates  Increasing activity/exercise  Talking with your doctor about reaching your blood sugar goals High blood sugars (greater than 180 mg/dL) can raise your risk of infections and slow your recovery, so you will need to focus on controlling your diabetes during the weeks before surgery. Make sure that the doctor who takes care of your diabetes knows about your planned surgery including the date and location.  How do I manage my blood sugar before surgery? Check your blood sugar at least 4 times a day, starting 2 days before surgery, to make sure that the level is not too high or low.  Check your blood sugar the morning of your surgery when you wake up and every 2 hours until you get to  the Short Stay unit.  If your blood sugar is less than 70 mg/dL, you will need to treat for low blood sugar: Do not take insulin. Treat a low blood sugar (less than 70 mg/dL) with  cup of clear juice (cranberry or apple), 4 glucose tablets, OR glucose gel. Recheck blood sugar in 15 minutes after treatment (to make sure it is greater than 70 mg/dL). If your blood sugar is not greater than 70 mg/dL on recheck, call [phone] for further  instructions. Report your blood sugar to the short stay nurse when you get to Short Stay.  If you are admitted to the hospital after surgery: Your blood sugar will be checked by the staff and you will probably be given insulin after surgery (instead of oral diabetes medicines) to make sure you have good blood sugar levels. The goal for blood sugar control after surgery is 80-180 mg/dL.             [facility] is not responsible for any belongings or valuables.    Do NOT Smoke (Tobacco/Vaping)  24 hours prior to your procedure  If you use a CPAP at night, you may bring your mask for your overnight stay.   Contacts, glasses, hearing aids, dentures or partials may not be worn into surgery, please bring cases for these belongings   For patients admitted to the hospital, discharge time will be determined by your treatment team.   Patients discharged the day of surgery will not be allowed to drive home, and someone needs to stay with them for 24 hours.   SURGICAL WAITING ROOM VISITATION Patients having surgery or a procedure may have no more than 2 support people in the waiting area - these visitors may rotate.   Children under the age of 39 must have an adult with them who is not the patient. If the patient needs to stay at the hospital during part of their recovery, the visitor guidelines for inpatient rooms apply. Pre-op nurse will coordinate an appropriate time for 1 support person to accompany patient in pre-op.  This support person may not rotate.   Please refer to [email or web address] for the visitor guidelines for Inpatients (after your surgery is over and you are in a regular room).    Special instructions:    Oral Hygiene is also important to reduce your risk of infection.  Remember - BRUSH YOUR TEETH THE MORNING OF SURGERY WITH YOUR REGULAR TOOTHPASTE   [facility]- Preparing For Surgery  Before surgery, you can play an  important role. Because skin is not sterile, your skin needs to be as free of germs as possible. You can reduce the number of germs on your skin by washing with CHG (chlorahexidine gluconate) Soap before surgery.  CHG is an antiseptic cleaner which kills germs and bonds with the skin to continue killing germs even after washing.     Please do not use if you have an allergy to CHG or antibacterial soaps. If your skin becomes reddened/irritated stop using the CHG.  Do not shave (including legs and underarms) for at least 48 hours prior to first CHG shower. It is OK to shave your face.  Please follow these instructions carefully.     Shower the NIGHT BEFORE SURGERY and the MORNING OF SURGERY with CHG Soap.   If you chose to wash your hair, wash your hair first as usual with your normal shampoo. After you shampoo, rinse your hair and body thoroughly to remove the shampoo.  Then [employer] and genitals (private parts) with your normal soap and rinse thoroughly to remove soap.  After that Use CHG Soap as you would any other liquid soap. You can apply CHG directly to the skin and wash gently with a scrungie or a clean washcloth.   Apply the CHG Soap to your body ONLY FROM THE NECK DOWN.  Do not use on open wounds or open sores. Avoid contact with your eyes, ears, mouth and genitals (private parts). Wash Face and genitals (private parts)  with your normal soap.   Wash thoroughly, paying special attention to the area where your surgery will be performed.  Thoroughly rinse your body with warm water from the neck down.  DO NOT shower/wash with your normal soap after using and rinsing off the CHG Soap.  Pat yourself dry with a CLEAN TOWEL.  Wear CLEAN PAJAMAS to bed the night before surgery  Place CLEAN SHEETS on your bed the night before your surgery  DO NOT SLEEP WITH PETS.   Day of Surgery:  Take a shower with CHG soap. Wear Clean/Comfortable clothing the morning of surgery Do not wear  jewelry or makeup. Do not wear lotions, powders, perfumes or deodorant. Do not shave 48 hours prior to surgery.  Do not bring valuables to the hospital. Do not wear nail polish, gel polish, artificial nails, or any other type of covering on natural nails (fingers and toes) If you have artificial nails or gel coating that need to be removed by a nail salon, please have this removed prior to surgery. Artificial nails or gel coating may interfere with anesthesia's ability to adequately monitor your vital signs.  Remember to brush your teeth WITH YOUR REGULAR TOOTHPASTE.    If you received a COVID test during your pre-op visit, it is requested that you wear a mask when out in public, stay away from anyone that may not be feeling well, and notify your surgeon if you develop symptoms. If you have been in contact with anyone that has tested positive in the last 10 days, please notify your surgeon.    Please read over the following fact sheets that you were given.

## 2022-10-21 NOTE — Progress Notes (Signed)
PCP - Dr. Reginia Forts Cardiologist - Dr. Gwyndolyn Kaufman  PPM/ICD - denies   Chest x-ray - N/A EKG - 01/24/22 Stress Test - 11/13/16 ECHO - 11/08/16 Cardiac Cath - denies  Sleep Study - 06/29/19- pt states she no longer has sleep apnea/does not use CPAP d/t weight loss.    Fasting Blood Sugar - Patient states she does not check her blood sugar often. She states that she tries to check it once a week. CBG 114 at PAT, A1c drawn today.   Last dose of GLP1 agonist-  Pt last took [facility] on 10/20/22- pt states she is aware to not take any more doses prior to surgery.   Blood Thinner Instructions: N/A Aspirin Instructions:N/A  ERAS Protcol -ERAS + G2   COVID TEST- N/A   Anesthesia review: no  Patient denies shortness of breath, fever, cough and chest pain at PAT appointment   All instructions explained to the patient, with a verbal understanding of the material. Patient agrees to go over the instructions while at home for a better understanding. The opportunity to ask questions was provided.

## 2022-10-22 LAB — HEMOGLOBIN A1C
Hgb A1c MFr Bld: 6.3 % — ABNORMAL HIGH (ref 4.8–5.6)
Mean Plasma Glucose: 134 mg/dL

## 2022-10-23 ENCOUNTER — Other Ambulatory Visit (HOSPITAL_COMMUNITY): Payer: Self-pay

## 2022-10-24 ENCOUNTER — Telehealth: Payer: Self-pay | Admitting: Orthopedic Surgery

## 2022-10-24 NOTE — Telephone Encounter (Signed)
Received $25.00 cash,medical records release form and disability paperwork/forwarding to [employer] today

## 2022-10-25 ENCOUNTER — Other Ambulatory Visit (HOSPITAL_COMMUNITY): Payer: Self-pay

## 2022-10-25 MED ORDER — ESOMEPRAZOLE MAGNESIUM 40 MG PO CPDR
40.0000 mg | DELAYED_RELEASE_CAPSULE | Freq: Every day | ORAL | 4 refills | Status: DC
  Filled 2022-10-25: qty 90, 90d supply, fill #0
  Filled 2023-01-20: qty 90, 90d supply, fill #1
  Filled 2023-04-20: qty 90, 90d supply, fill #2
  Filled 2023-07-23: qty 90, 90d supply, fill #3

## 2022-10-28 ENCOUNTER — Other Ambulatory Visit (HOSPITAL_COMMUNITY): Payer: Self-pay

## 2022-10-29 ENCOUNTER — Encounter (HOSPITAL_COMMUNITY): Payer: Self-pay | Admitting: Orthopedic Surgery

## 2022-10-29 ENCOUNTER — Ambulatory Visit (HOSPITAL_COMMUNITY)
Admission: RE | Admit: 2022-10-29 | Discharge: 2022-10-29 | Disposition: A | Discharge status: Home / Self Care | Payer: 59 | Source: Ambulatory Visit | Attending: Orthopedic Surgery | Admitting: Orthopedic Surgery

## 2022-10-29 ENCOUNTER — Other Ambulatory Visit: Payer: Self-pay

## 2022-10-29 ENCOUNTER — Ambulatory Visit (HOSPITAL_COMMUNITY): Payer: 59 | Admitting: Anesthesiology

## 2022-10-29 ENCOUNTER — Ambulatory Visit (HOSPITAL_BASED_OUTPATIENT_CLINIC_OR_DEPARTMENT_OTHER): Payer: 59 | Admitting: Anesthesiology

## 2022-10-29 ENCOUNTER — Encounter (HOSPITAL_COMMUNITY)
Admission: RE | Disposition: A | Discharge status: Home / Self Care | Payer: Self-pay | Source: Ambulatory Visit | Attending: Orthopedic Surgery

## 2022-10-29 ENCOUNTER — Other Ambulatory Visit (HOSPITAL_COMMUNITY): Payer: Self-pay

## 2022-10-29 DIAGNOSIS — J45909 Unspecified asthma, uncomplicated: Secondary | ICD-10-CM | POA: Insufficient documentation

## 2022-10-29 DIAGNOSIS — Z87891 Personal history of nicotine dependence: Secondary | ICD-10-CM

## 2022-10-29 DIAGNOSIS — Z01818 Encounter for other preprocedural examination: Secondary | ICD-10-CM

## 2022-10-29 DIAGNOSIS — K589 Irritable bowel syndrome without diarrhea: Secondary | ICD-10-CM | POA: Insufficient documentation

## 2022-10-29 DIAGNOSIS — M19012 Primary osteoarthritis, left shoulder: Secondary | ICD-10-CM

## 2022-10-29 DIAGNOSIS — Z7984 Long term (current) use of oral hypoglycemic drugs: Secondary | ICD-10-CM | POA: Insufficient documentation

## 2022-10-29 DIAGNOSIS — Z6832 Body mass index (BMI) 32.0-32.9, adult: Secondary | ICD-10-CM | POA: Diagnosis not present

## 2022-10-29 DIAGNOSIS — I10 Essential (primary) hypertension: Secondary | ICD-10-CM | POA: Insufficient documentation

## 2022-10-29 DIAGNOSIS — S43432A Superior glenoid labrum lesion of left shoulder, initial encounter: Secondary | ICD-10-CM

## 2022-10-29 DIAGNOSIS — M797 Fibromyalgia: Secondary | ICD-10-CM | POA: Insufficient documentation

## 2022-10-29 DIAGNOSIS — E669 Obesity, unspecified: Secondary | ICD-10-CM | POA: Insufficient documentation

## 2022-10-29 DIAGNOSIS — F32A Depression, unspecified: Secondary | ICD-10-CM | POA: Insufficient documentation

## 2022-10-29 DIAGNOSIS — K219 Gastro-esophageal reflux disease without esophagitis: Secondary | ICD-10-CM | POA: Diagnosis not present

## 2022-10-29 DIAGNOSIS — Z79899 Other long term (current) drug therapy: Secondary | ICD-10-CM | POA: Diagnosis not present

## 2022-10-29 DIAGNOSIS — E119 Type 2 diabetes mellitus without complications: Secondary | ICD-10-CM | POA: Insufficient documentation

## 2022-10-29 DIAGNOSIS — M65812 Other synovitis and tenosynovitis, left shoulder: Secondary | ICD-10-CM

## 2022-10-29 DIAGNOSIS — Z7985 Long-term (current) use of injectable non-insulin antidiabetic drugs: Secondary | ICD-10-CM | POA: Insufficient documentation

## 2022-10-29 DIAGNOSIS — Z9989 Dependence on other enabling machines and devices: Secondary | ICD-10-CM | POA: Diagnosis not present

## 2022-10-29 DIAGNOSIS — M659 Synovitis and tenosynovitis, unspecified: Secondary | ICD-10-CM | POA: Diagnosis not present

## 2022-10-29 DIAGNOSIS — K760 Fatty (change of) liver, not elsewhere classified: Secondary | ICD-10-CM | POA: Diagnosis not present

## 2022-10-29 DIAGNOSIS — M7522 Bicipital tendinitis, left shoulder: Secondary | ICD-10-CM | POA: Diagnosis not present

## 2022-10-29 DIAGNOSIS — G8918 Other acute postprocedural pain: Secondary | ICD-10-CM | POA: Diagnosis not present

## 2022-10-29 DIAGNOSIS — E785 Hyperlipidemia, unspecified: Secondary | ICD-10-CM | POA: Insufficient documentation

## 2022-10-29 DIAGNOSIS — G4733 Obstructive sleep apnea (adult) (pediatric): Secondary | ICD-10-CM | POA: Diagnosis not present

## 2022-10-29 HISTORY — PX: SHOULDER ARTHROSCOPY WITH OPEN ROTATOR CUFF REPAIR AND DISTAL CLAVICLE ACROMINECTOMY: SHX5683

## 2022-10-29 HISTORY — PX: BICEPT TENODESIS: SHX5116

## 2022-10-29 LAB — GLUCOSE, CAPILLARY
Glucose-Capillary: 107 mg/dL — ABNORMAL HIGH (ref 70–99)
Glucose-Capillary: 180 mg/dL — ABNORMAL HIGH (ref 70–99)

## 2022-10-29 SURGERY — SHOULDER ARTHROSCOPY WITH OPEN ROTATOR CUFF REPAIR AND DISTAL CLAVICLE ACROMINECTOMY
Anesthesia: Regional | Site: Shoulder | Laterality: Left

## 2022-10-29 MED ORDER — ONDANSETRON HCL 4 MG/2ML IJ SOLN
INTRAMUSCULAR | Status: DC | PRN
  Administered 2022-10-29: 4 mg via INTRAVENOUS

## 2022-10-29 MED ORDER — ROCURONIUM BROMIDE 10 MG/ML (PF) SYRINGE
PREFILLED_SYRINGE | INTRAVENOUS | Status: AC
  Filled 2022-10-29: qty 10

## 2022-10-29 MED ORDER — BUPIVACAINE LIPOSOME 1.3 % IJ SUSP
INTRAMUSCULAR | Status: DC | PRN
  Administered 2022-10-29: 10 mL via PERINEURAL

## 2022-10-29 MED ORDER — ROCURONIUM BROMIDE 10 MG/ML (PF) SYRINGE
PREFILLED_SYRINGE | INTRAVENOUS | Status: DC | PRN
  Administered 2022-10-29: 50 mg via INTRAVENOUS

## 2022-10-29 MED ORDER — FENTANYL CITRATE (PF) 100 MCG/2ML IJ SOLN
INTRAMUSCULAR | Status: AC
  Administered 2022-10-29: 50 ug via INTRAVENOUS
  Filled 2022-10-29: qty 2

## 2022-10-29 MED ORDER — SODIUM CHLORIDE (PF) 0.9 % IJ SOLN
INTRAMUSCULAR | Status: DC | PRN
  Administered 2022-10-29: 15 mL via INTRAVENOUS

## 2022-10-29 MED ORDER — CEFAZOLIN SODIUM-DEXTROSE 2-4 GM/100ML-% IV SOLN
2.0000 g | INTRAVENOUS | Status: AC
  Administered 2022-10-29: 2 g via INTRAVENOUS
  Filled 2022-10-29: qty 100

## 2022-10-29 MED ORDER — DEXAMETHASONE SODIUM PHOSPHATE 10 MG/ML IJ SOLN
INTRAMUSCULAR | Status: DC | PRN
  Administered 2022-10-29: 10 mg via INTRAVENOUS

## 2022-10-29 MED ORDER — PROPOFOL 10 MG/ML IV BOLUS
INTRAVENOUS | Status: DC | PRN
  Administered 2022-10-29: 130 mg via INTRAVENOUS

## 2022-10-29 MED ORDER — PHENYLEPHRINE 80 MCG/ML (10ML) SYRINGE FOR IV PUSH (FOR BLOOD PRESSURE SUPPORT)
PREFILLED_SYRINGE | INTRAVENOUS | Status: DC | PRN
  Administered 2022-10-29: 40 ug via INTRAVENOUS
  Administered 2022-10-29: 80 ug via INTRAVENOUS

## 2022-10-29 MED ORDER — FENTANYL CITRATE (PF) 100 MCG/2ML IJ SOLN: 25.0000 ug | INTRAMUSCULAR | Status: DC | PRN

## 2022-10-29 MED ORDER — SUGAMMADEX SODIUM 200 MG/2ML IV SOLN
INTRAVENOUS | Status: DC | PRN
  Administered 2022-10-29: 200 mg via INTRAVENOUS

## 2022-10-29 MED ORDER — LACTATED RINGERS IV SOLN: INTRAVENOUS | Status: DC

## 2022-10-29 MED ORDER — EPINEPHRINE PF 1 MG/ML IJ SOLN
INTRAMUSCULAR | Status: DC | PRN
  Administered 2022-10-29: .15 mg via INTRAMUSCULAR

## 2022-10-29 MED ORDER — MIDAZOLAM HCL 2 MG/2ML IJ SOLN: 2.0000 mg | Freq: Once | INTRAMUSCULAR | Status: AC

## 2022-10-29 MED ORDER — PROPOFOL 10 MG/ML IV BOLUS
INTRAVENOUS | Status: AC
  Filled 2022-10-29: qty 20

## 2022-10-29 MED ORDER — TRANEXAMIC ACID-NACL 1000-0.7 MG/100ML-% IV SOLN
1000.0000 mg | INTRAVENOUS | Status: AC
  Administered 2022-10-29: 1000 mg via INTRAVENOUS
  Filled 2022-10-29: qty 100

## 2022-10-29 MED ORDER — FENTANYL CITRATE (PF) 100 MCG/2ML IJ SOLN: 50.0000 ug | Freq: Once | INTRAMUSCULAR | Status: AC

## 2022-10-29 MED ORDER — MIDAZOLAM HCL 2 MG/2ML IJ SOLN
INTRAMUSCULAR | Status: AC
  Administered 2022-10-29: 2 mg via INTRAVENOUS
  Filled 2022-10-29: qty 2

## 2022-10-29 MED ORDER — PHENYLEPHRINE HCL-NACL 20-0.9 MG/250ML-% IV SOLN
INTRAVENOUS | Status: DC | PRN
  Administered 2022-10-29: 25 ug/min via INTRAVENOUS

## 2022-10-29 MED ORDER — FENTANYL CITRATE (PF) 250 MCG/5ML IJ SOLN
INTRAMUSCULAR | Status: AC
  Filled 2022-10-29: qty 5

## 2022-10-29 MED ORDER — ONDANSETRON HCL 4 MG/2ML IJ SOLN
INTRAMUSCULAR | Status: AC
  Filled 2022-10-29: qty 2

## 2022-10-29 MED ORDER — CHLORHEXIDINE GLUCONATE 0.12 % MT SOLN
15.0000 mL | Freq: Once | OROMUCOSAL | Status: AC
  Administered 2022-10-29: 15 mL via OROMUCOSAL

## 2022-10-29 MED ORDER — OXYCODONE-ACETAMINOPHEN 5-325 MG PO TABS
1.0000 | ORAL_TABLET | ORAL | 0 refills | Status: DC | PRN
  Filled 2022-10-29: qty 30, 5d supply, fill #0

## 2022-10-29 MED ORDER — ACETAMINOPHEN 500 MG PO TABS
1000.0000 mg | ORAL_TABLET | Freq: Once | ORAL | Status: AC
  Administered 2022-10-29: 1000 mg via ORAL
  Filled 2022-10-29: qty 2

## 2022-10-29 MED ORDER — SODIUM CHLORIDE 0.9 % IR SOLN
Status: DC | PRN
  Administered 2022-10-29: 6000 mL

## 2022-10-29 MED ORDER — METHOCARBAMOL 500 MG PO TABS
500.0000 mg | ORAL_TABLET | Freq: Three times a day (TID) | ORAL | 1 refills | Status: AC | PRN
  Filled 2022-10-29: qty 30, 10d supply, fill #0

## 2022-10-29 MED ORDER — ORAL CARE MOUTH RINSE: 15.0000 mL | Freq: Once | OROMUCOSAL | Status: AC

## 2022-10-29 MED ORDER — BUPIVACAINE HCL (PF) 0.5 % IJ SOLN
INTRAMUSCULAR | Status: DC | PRN
  Administered 2022-10-29: 15 mL via PERINEURAL

## 2022-10-29 MED ORDER — ORAL CARE MOUTH RINSE: 15.0000 mL | Freq: Once | OROMUCOSAL | Status: DC

## 2022-10-29 MED ORDER — DEXAMETHASONE SODIUM PHOSPHATE 10 MG/ML IJ SOLN
INTRAMUSCULAR | Status: AC
  Filled 2022-10-29: qty 1

## 2022-10-29 MED ORDER — FENTANYL CITRATE (PF) 250 MCG/5ML IJ SOLN
INTRAMUSCULAR | Status: DC | PRN
  Administered 2022-10-29 (×2): 100 ug via INTRAVENOUS

## 2022-10-29 MED ORDER — CHLORHEXIDINE GLUCONATE 0.12 % MT SOLN
15.0000 mL | Freq: Once | OROMUCOSAL | Status: DC
  Filled 2022-10-29: qty 15

## 2022-10-29 SURGICAL SUPPLY — 71 items
AID PSTN UNV HD RSTRNT DISP (MISCELLANEOUS) ×1
ANCH SUT 2 FBRTK KNTLS 1.8 (Anchor) ×1 IMPLANT
ANCHOR SUT 1.8 FIBERTAK SB KL (Anchor) IMPLANT
BAG COUNTER SPONGE SURGICOUNT (BAG) ×2 IMPLANT
BAG SPNG CNTER NS LX DISP (BAG) ×1
BLADE EXCALIBUR 4.0X13 (MISCELLANEOUS) ×2 IMPLANT
BLADE SHAVER TORPEDO 4X13 (MISCELLANEOUS) IMPLANT
BLADE SURG 11 STRL SS (BLADE) ×2 IMPLANT
BURR OVAL 8 FLU 4.0X13 (MISCELLANEOUS) IMPLANT
COVER SURGICAL LIGHT HANDLE (MISCELLANEOUS) ×2 IMPLANT
DISSECTOR 4.0MM X 13CM (MISCELLANEOUS) IMPLANT
DRAPE INCISE IOBAN 66X45 STRL (DRAPES) ×4 IMPLANT
DRAPE STERI 35X30 U-POUCH (DRAPES) ×2 IMPLANT
DRAPE U-SHAPE 47X51 STRL (DRAPES) ×4 IMPLANT
DRSG TEGADERM 4X4.5 CHG (GAUZE/BANDAGES/DRESSINGS) IMPLANT
DRSG TEGADERM 4X4.75 (GAUZE/BANDAGES/DRESSINGS) ×8 IMPLANT
DRSG XEROFORM 1X8 (GAUZE/BANDAGES/DRESSINGS) IMPLANT
DURAPREP 26ML APPLICATOR (WOUND CARE) ×2 IMPLANT
ELECT REM PT RETURN 9FT ADLT (ELECTROSURGICAL) ×1
ELECTRODE REM PT RTRN 9FT ADLT (ELECTROSURGICAL) ×2 IMPLANT
FILTER STRAW FLUID ASPIR (MISCELLANEOUS) ×2 IMPLANT
GAUZE PAD ABD 8X10 STRL (GAUZE/BANDAGES/DRESSINGS) ×6 IMPLANT
GAUZE SPONGE 4X4 12PLY STRL (GAUZE/BANDAGES/DRESSINGS) IMPLANT
GAUZE SPONGE 4X4 12PLY STRL LF (GAUZE/BANDAGES/DRESSINGS) ×2 IMPLANT
GAUZE XEROFORM 1X8 LF (GAUZE/BANDAGES/DRESSINGS) ×2 IMPLANT
GLOVE BIOGEL PI IND STRL 8 (GLOVE) ×2 IMPLANT
GLOVE ECLIPSE 8.0 STRL XLNG CF (GLOVE) ×2 IMPLANT
GOWN STRL REUS W/ TWL LRG LVL3 (GOWN DISPOSABLE) ×6 IMPLANT
GOWN STRL REUS W/TWL LRG LVL3 (GOWN DISPOSABLE) ×3
KIT BASIN OR (CUSTOM PROCEDURE TRAY) ×2 IMPLANT
KIT STR SPEAR 1.8 FBRTK DISP (KITS) IMPLANT
KIT TURNOVER KIT B (KITS) ×2 IMPLANT
MANIFOLD NEPTUNE II (INSTRUMENTS) ×2 IMPLANT
NDL HYPO 25X1 1.5 SAFETY (NEEDLE) ×2 IMPLANT
NDL SCORPION MULTI FIRE (NEEDLE) IMPLANT
NDL SPNL 18GX3.5 QUINCKE PK (NEEDLE) ×2 IMPLANT
NDL SUT 6 .5 CRC .975X.05 MAYO (NEEDLE) IMPLANT
NEEDLE HYPO 25X1 1.5 SAFETY (NEEDLE) ×1 IMPLANT
NEEDLE MAYO TAPER (NEEDLE)
NEEDLE SCORPION MULTI FIRE (NEEDLE) IMPLANT
NEEDLE SPNL 18GX3.5 QUINCKE PK (NEEDLE) ×1 IMPLANT
NS IRRIG 1000ML POUR BTL (IV SOLUTION) ×2 IMPLANT
PACK SHOULDER (CUSTOM PROCEDURE TRAY) ×2 IMPLANT
PAD ARMBOARD 7.5X6 YLW CONV (MISCELLANEOUS) ×4 IMPLANT
PROBE APOLLO 90XL (SURGICAL WAND) IMPLANT
RESTRAINT HEAD UNIVERSAL NS (MISCELLANEOUS) ×2 IMPLANT
SLING ARM IMMOBILIZER MED (SOFTGOODS) IMPLANT
SPONGE T-LAP 4X18 ~~LOC~~+RFID (SPONGE) ×4 IMPLANT
STRIP CLOSURE SKIN 1/2X4 (GAUZE/BANDAGES/DRESSINGS) ×2 IMPLANT
SUCTION FRAZIER HANDLE 10FR (MISCELLANEOUS) ×1
SUCTION TUBE FRAZIER 10FR DISP (MISCELLANEOUS) ×2 IMPLANT
SUT ETHILON 3 0 PS 1 (SUTURE) ×2 IMPLANT
SUT FIBERWIRE #2 38 T-5 BLUE (SUTURE)
SUT MNCRL AB 3-0 PS2 18 (SUTURE) ×2 IMPLANT
SUT VIC AB 0 CT1 27 (SUTURE) ×1
SUT VIC AB 0 CT1 27XBRD ANBCTR (SUTURE) ×2 IMPLANT
SUT VIC AB 1 CT1 27 (SUTURE) ×1
SUT VIC AB 1 CT1 27XBRD ANBCTR (SUTURE) ×2 IMPLANT
SUT VIC AB 2-0 CT1 27 (SUTURE) ×1
SUT VIC AB 2-0 CT1 TAPERPNT 27 (SUTURE) ×2 IMPLANT
SUT VICRYL 0 UR6 27IN ABS (SUTURE) IMPLANT
SUTURE FIBERWR #2 38 T-5 BLUE (SUTURE) IMPLANT
SYR 20ML LL LF (SYRINGE) ×4 IMPLANT
SYR 3ML LL SCALE MARK (SYRINGE) ×2 IMPLANT
SYR TB 1ML LUER SLIP (SYRINGE) ×2 IMPLANT
SYS FBRTK BUTTON 2.6 (Anchor) ×1 IMPLANT
SYSTEM FBRTK BUTTON 2.6 (Anchor) IMPLANT
TOWEL GREEN STERILE (TOWEL DISPOSABLE) ×2 IMPLANT
TOWEL GREEN STERILE FF (TOWEL DISPOSABLE) ×2 IMPLANT
TUBING ARTHROSCOPY IRRIG 16FT (MISCELLANEOUS) ×2 IMPLANT
WATER STERILE IRR 1000ML POUR (IV SOLUTION) ×2 IMPLANT

## 2022-10-29 NOTE — Anesthesia Procedure Notes (Signed)
Anesthesia Regional Block: Interscalene brachial plexus block   Pre-Anesthetic Checklist: , timeout performed,  Correct Patient, Correct Site, Correct Laterality,  Correct Procedure, Correct Position, site marked,  Risks and benefits discussed,  Pre-op evaluation,  At surgeon's request and post-op pain management  Laterality: Left  Prep: Maximum Sterile Barrier Precautions used, chloraprep       Needles:  Injection technique: Single-shot  Needle Type: Echogenic Stimulator Needle     Needle Length: 5cm  Needle Gauge: 21     Additional Needles:   Procedures:,,,, ultrasound used (permanent image in chart),,    Narrative:  Start time: 10/29/2022 11:20 AM End time: 10/29/2022 11:25 AM Injection made incrementally with aspirations every 5 mL. Anesthesiologist: Freddrick March, MD

## 2022-10-29 NOTE — Brief Op Note (Signed)
   10/29/2022  1:29 PM  PATIENT:  [name]  61 y.o. female  PRE-OPERATIVE DIAGNOSIS:  left shoulder biceps tendonitis, acromioclavicular joint osteoarthritis  POST-OPERATIVE DIAGNOSIS:  left shoulder biceps tendonitis, acromioclavicular joint osteoarthritis, glenohumeral arthritis  PROCEDURE:  Procedure(s): LEFT SHOULDER ACROMIOCLAVICULAR RESECTION, DEBRIDEMENT, BICEPS TENODESIS  SURGEON:  Surgeon(s): Marlou Sa, Tonna Corner, MD  ASSISTANT: Annie Main, PA  ANESTHESIA:   General  EBL: 20 ml    Total I/O In: 100 [IV Piggyback:100] Out: -   BLOOD ADMINISTERED: none  DRAINS: None  LOCAL MEDICATIONS USED:  none  SPECIMEN:  No Specimen  COUNTS:  YES  TOURNIQUET:  * No tourniquets in log *  DICTATION: .Other Dictation: Dictation Number done  PLAN OF CARE: Discharge to home after PACU  PATIENT DISPOSITION:  PACU - hemodynamically stable

## 2022-10-29 NOTE — Transfer of Care (Signed)
Immediate Anesthesia Transfer of Care Note  Patient: Cristina [name]  Procedure(s) Performed: LEFT SHOULDER ACROMIOCLAVICULAR RESECTION, DEBRIDEMENT (Left: Shoulder) BICEPS TENODESIS (Left: Shoulder)  Patient Location: PACU  Anesthesia Type:General and Regional  Level of Consciousness: drowsy, patient cooperative, and responds to stimulation  Airway & Oxygen Therapy: Patient Spontanous Breathing and Patient connected to face mask oxygen  Post-op Assessment: Report given to RN and Post -op Vital signs reviewed and stable  Post vital signs: stable  Last Vitals:  Vitals Value Taken Time  BP 105/71 10/29/22 1403  Temp    Pulse 77 10/29/22 1406  Resp 16 10/29/22 1406  SpO2 92 % 10/29/22 1406  Vitals shown include unvalidated device data.  Last Pain:  Vitals:   10/29/22 1036  TempSrc: Oral  PainSc: 0-No pain         Complications: No notable events documented.

## 2022-10-29 NOTE — Anesthesia Procedure Notes (Addendum)
Procedure Name: Intubation Date/Time: 10/29/2022 12:05 PM  Performed by: Alain Marion, CRNAPre-anesthesia Checklist: Patient identified, Emergency Drugs available, Suction available and Patient being monitored Patient Re-evaluated:Patient Re-evaluated prior to induction Oxygen Delivery Method: Circle system utilized Preoxygenation: Pre-oxygenation with 100% oxygen Induction Type: IV induction Ventilation: Mask ventilation without difficulty Laryngoscope Size: Miller and 2 Grade View: Grade I Tube type: Oral Number of attempts: 1 Airway Equipment and Method: Stylet and Oral airway Placement Confirmation: ETT inserted through vocal cords under direct vision, positive ETCO2 and breath sounds checked- equal and bilateral Secured at: 21 cm Tube secured with: Tape Dental Injury: Teeth and Oropharynx as per pre-operative assessment

## 2022-10-29 NOTE — Anesthesia Postprocedure Evaluation (Signed)
Anesthesia Post Note  Patient: DEOLINDA FRID  Procedure(s) Performed: LEFT SHOULDER ACROMIOCLAVICULAR RESECTION, DEBRIDEMENT (Left: Shoulder) BICEPS TENODESIS (Left: Shoulder)     Patient location during evaluation: PACU Anesthesia Type: Regional and General Level of consciousness: awake and alert Pain management: pain level controlled Vital Signs Assessment: post-procedure vital signs reviewed and stable Respiratory status: spontaneous breathing, nonlabored ventilation and respiratory function stable Cardiovascular status: blood pressure returned to baseline and stable Postop Assessment: no apparent nausea or vomiting Anesthetic complications: no  No notable events documented.  Last Vitals:  Vitals:   10/29/22 1415 10/29/22 1430  BP: 104/73 138/80  Pulse: 73 68  Resp: 20 14  Temp:  (!) 36.2 C  SpO2: 96% 95%    Last Pain:  Vitals:   10/29/22 1402  TempSrc:   PainSc: 0-No pain                 Kalynne Womac,W. EDMOND

## 2022-10-29 NOTE — H&P (Signed)
Cristina [name] is an 61 y.o. female.   Chief Complaint: Left shoulder pain HPI: Cristina [name] is a 60 y.o. female who presents with  left shoulder pain.  Patient had glenohumeral joint injection 09/02/2022.  Has been able to get some relief from that but does report continued anterior shoulder pain.  The pain comes and goes and is worse with certain movements.  Does not wake her from sleep.  She also describes some mild AC joint tenderness.  MRI scan from 1020 demonstrates severe tendinosis of the long head of the biceps with moderate arthropathy AC joint.  Tendenosis is also present in the rotator cuff tendons but no full-thickness tear is present..     Past Medical History:  Diagnosis Date   Allergic rhinitis, cause unspecified    Anemia    Arthritis    Asthma    Back pain    Chest pain    Constipation    CTS (carpal tunnel syndrome)    Cystocele    Depression    Diabetes mellitus    Dysfunction of eustachian tube    Essential hypertension 02/12/2017   Family history of adverse reaction to anesthesia    daughter hard to wake up   Fatty liver    Fibromyalgia    Fissure, anal    Gallbladder problem    Genital herpes    GERD (gastroesophageal reflux disease)    Headache    Hemorrhoid    Hx of migraine headaches    Hyperlipidemia    IBS (irritable bowel syndrome)    Insomnia    Iron deficiency anemia, unspecified    Joint pain    Nausea    Obesity    OSA on CPAP    Osteoarthritis    Rectocele    Sleep apnea    lost weight, does not use CPAP   TMJ (dislocation of temporomandibular joint)     Past Surgical History:  Procedure Laterality Date   ABDOMINAL HYSTERECTOMY  11/11/2010   [place] Plan.  Ovaries intact.  Uterine fibroids with DUB.   CARPAL TUNNEL RELEASE  11/12/1987   Left   CHOLECYSTECTOMY     CHOLECYSTECTOMY     COLONOSCOPY  06/29/2012   normal.  Repeat in 5 years.   DILATION AND CURETTAGE OF UTERUS     ECTOPIC PREGNANCY SURGERY  11/12/1991    ESOPHAGOGASTRODUODENOSCOPY  12/13/2011   dysphagia.  Henrene Pastor.  Normal.   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  CPAP titration at 12 cm water pressure.   TOTAL KNEE ARTHROPLASTY Left 05/31/2021   Procedure: LEFT TOTAL KNEE ARTHROPLASTY;  Surgeon: Mcarthur Rossetti, MD;  Location: [place];  Service: Orthopedics;  Laterality: Left;   TUBAL LIGATION  11/11/1986    Family History  Problem Relation Age of Onset   Hypertension Mother 58   Osteoarthritis Mother    Diabetes Mother    Colon cancer Mother 95   Cancer Mother 19       colon cancer   Hyperlipidemia Mother    Obesity Mother    Sudden death Mother    Arthritis Mother    Colon polyps Brother        x 2 brother   Cirrhosis Brother        alcoholism   Alcohol abuse Brother  Sleep apnea Brother    Hyperlipidemia Brother    Migraines Brother    Obstructive Sleep Apnea Brother    Colon cancer Paternal Aunt    Stroke Maternal Grandmother    Colon cancer Paternal Grandmother    Cancer Paternal Grandmother        colon cancer   Diabetes Daughter    Hypertension Daughter    Sleep apnea Daughter    Mental illness Daughter        depression   Migraines Daughter    Migraines Daughter    Mental illness Daughter        anxiety attacks   Protein S deficiency Daughter    Esophageal cancer Neg Hx    Stomach cancer Neg Hx    Pancreatic cancer Neg Hx    Social History:  reports that she has quit smoking. Her smoking use included cigarettes. She has never used smokeless tobacco. She reports that she does not drink alcohol and does not use drugs.  Allergies:  Allergies  Allergen Reactions   Atorvastatin Other (See Comments)    Pt reports causes muscle aches   Fish Oil Rash    Medications Prior to Admission  Medication Sig  Dispense Refill   acyclovir (ZOVIRAX) 400 MG tablet Take 1 tablet (400 mg total) by mouth 2 (two) times daily. 180 tablet 4   azelastine (ASTELIN) 0.1 % nasal spray PLACE 2 SPRAYS INTO BOTH NOSTRILS 2 TIMES DAILY AS DIRECTED (Patient taking differently: Place 2 sprays into both nostrils daily as needed for allergies or rhinitis.) 90 mL 3   buPROPion (WELLBUTRIN) 100 MG tablet Take 1 tablet (100 mg total) by mouth 2 (two) times daily 180 tablet 3   calcium carbonate (OS-CAL) 1250 (500 Ca) MG chewable tablet Chew 1 tablet by mouth daily.     cetirizine (ZYRTEC) 10 MG tablet Take 1 tablet (10 mg total) by mouth once daily 90 tablet 4   cetirizine (ZYRTEC) 10 MG tablet Take 1 tablet (10 mg total) by mouth once daily 90 tablet 3   empagliflozin (JARDIANCE) 25 MG TABS tablet Take 1 tablet (25 mg total) by mouth daily with breakfast. 90 tablet 3   escitalopram (LEXAPRO) 10 MG tablet Take 1 tablet (10 mg total) by mouth once daily 90 tablet 1   esomeprazole (NEXIUM) 40 MG capsule Take 1 capsule (40 mg total) by mouth daily. 90 capsule 4   esomeprazole (NEXIUM) 40 MG capsule Take 1 capsule (40 mg total) by mouth daily. 90 capsule 4   Evolocumab (REPATHA SURECLICK) 938 MG/ML SOAJ Inject 1 mL into the skin every 14 (fourteen) days. 6 mL 3   fluticasone (FLONASE) 50 MCG/ACT nasal spray Place 2 sprays into both nostrils daily. 48 g 3   gabapentin (NEURONTIN) 300 MG capsule Take 1 capsule (300 mg total) by mouth at bedtime. 90 capsule 4   Multiple Vitamins-Minerals (MULTIVITAMIN WITH MINERALS) tablet Take 1 tablet by mouth daily.     nitroGLYCERIN (NITROSTAT) 0.4 MG SL tablet Place 1 tablet (0.4 mg total) under the tongue every 5 (five) minutes as needed for Chest pain Maximum of 3 doses in 24 hour period. 25 tablet 1   Semaglutide-Weight Management 2.4 MG/0.75ML SOAJ Inject 2.4 mg into the skin once a week. 3 mL 11   albuterol (VENTOLIN HFA) 108 (90 Base) MCG/ACT inhaler Inhale 2 puffs into the lungs every 6  (six) hours as needed for wheezing 54 g 1   ANUCORT-HC 25 MG suppository PLACE 1 SUPPOSITORY RECTALLY  2 TIMES DAILY AS NEEDED FOR HEMORRHOIDS. 25 suppository 5   azelastine (OPTIVAR) 0.05 % ophthalmic solution Place 1 drop into both eyes 2 (two) times daily as needed. 18 mL 4   Blood Glucose Monitoring Suppl (FREESTYLE LITE) w/Device KIT use to test blood sugar once daily 1 kit 0   buPROPion (WELLBUTRIN) 100 MG tablet Take 1 tablet (100 mg total) by mouth 2 (two) times daily (Patient not taking: Reported on 10/17/2022) 180 tablet 4   diltiazem 2 % GEL Apply 1 application topically 3 (three) times daily. (Patient taking differently: Apply 1 application  topically 3 (three) times daily as needed (anal fissure).) 1 Package 3   empagliflozin (JARDIANCE) 25 MG TABS tablet Take 1 tablet (25 mg total) by mouth daily with breakfast. (Patient not taking: Reported on 10/17/2022) 90 tablet 4   escitalopram (LEXAPRO) 10 MG tablet TAKE 1 TABLET BY MOUTH ONCE DAILY (Patient not taking: Reported on 10/17/2022) 90 tablet 4   glucose blood (FREESTYLE LITE) test strip use 1 strip to test blood sugar once daily 100 each 3   glucose blood test strip Use as directed 100 each 1   Lancets (FREESTYLE) lancets use 1 lancet to test blood sugar once daily 100 each 4   TRUEPLUS LANCETS 30G MISC USE TO CHECK BLOOD GLUCOSE DAILY AS DIRECTED 100 each 3    Results for orders placed or performed during the hospital encounter of 10/29/22 (from the past 48 hour(s))  Glucose, capillary     Status: Abnormal   Collection Time: 10/29/22 10:34 AM  Result Value Ref Range   Glucose-Capillary 107 (H) 70 - 99 mg/dL    Comment: Glucose reference range applies only to samples taken after fasting for at least 8 hours.   Comment 1 Notify RN    Comment 2 Document in Chart    *Note: Due to a large number of results and/or encounters for the requested time period, some results have not been displayed. A complete set of results can be found in  Results Review.   No results found.  Review of Systems  Musculoskeletal:  Positive for arthralgias.  All other systems reviewed and are negative.   Blood pressure 119/78, pulse 68, temperature 98.3 F (36.8 C), temperature source Oral, resp. rate 15, height 5' (1.524 m), weight 74.4 kg, last menstrual period 11/11/2010, SpO2 95 %. Physical Exam Vitals reviewed.  HENT:     Head: Normocephalic.     Nose: Nose normal.     Mouth/Throat:     Mouth: Mucous membranes are moist.  Eyes:     Pupils: Pupils are equal, round, and reactive to light.  Cardiovascular:     Rate and Rhythm: Normal rate.     Pulses: Normal pulses.  Pulmonary:     Effort: Pulmonary effort is normal.  Abdominal:     General: Abdomen is flat.  Musculoskeletal:     Cervical back: Normal range of motion.  Skin:    General: Skin is warm.     Capillary Refill: Capillary refill takes less than 2 seconds.  Neurological:     General: No focal deficit present.     Mental Status: She is alert.  Psychiatric:        Mood and Affect: Mood normal.    Ortho exam demonstrates maintenance of full passive range of motion left shoulder versus right. Patient does have positive O'Brien's testing positive speeds testing as well as tenderness of the biceps tendon to direct palpation of the left   shoulder. AC joint tenderness is also present on the left compared to the right. No coarse grinding or crepitus present with internal/external rotation of that left arm at 90 degrees of abduction. Motor or sensory function of the hand is intact. Cervical spine range of motion intact.   Assessment/Plan Impression is left shoulder pain with biceps tendinitis and possible AC joint arthropathy as well which is symptomatic. Plan is left shoulder arthroscopy with debridement, biceps tenodesis, possible arthroscopic distal clavicle excision. The risk and benefits of the procedure discussed with the patient include not limited to infection or vessel  damage incomplete pain relief as well as potential for more surgery if the rotator cuff tear is down the road. Patient understands risk benefits and wishes to proceed.   G Scott , MD 10/29/2022, 11:36 AM    

## 2022-10-29 NOTE — Op Note (Signed)
NAME: Cristina, [place] Cristina [name]. MEDICAL RECORD NO: [id number] ACCOUNT NO: [id number] DATE OF BIRTH: 22-Feb-1961 FACILITY: [facility] LOCATION: [facility] PHYSICIAN: Yetta Barre. Marlou Sa, MD  Operative Report   DATE OF PROCEDURE: 10/29/2022  PREOPERATIVE DIAGNOSIS:  Left shoulder AC joint arthritis and biceps tendinitis.  POSTOPERATIVE DIAGNOSIS:  Left shoulder AC joint arthritis, biceps tendinitis with degenerative unstable SLAP tear and grade 3 glenohumeral joint arthritis, predominantly on the humeral head.  PROCEDURES: 1.  Left shoulder arthroscopy with debridement of the superior labrum and release of the biceps tendon as well as debridement of the glenohumeral articular surface chondral irregularities. 2. Arthroscopic distal clavicle excision and subacromial decompression. 3.  Mini open biceps tenodesis.  SURGEON:  Yetta Barre. Marlou Sa, MD  ASSISTANT:  Annie Main, PA.  INDICATIONS:  This is a 61 year old patient with left shoulder pain who presents for operative management after explanation of risks and benefits.  DESCRIPTION OF PROCEDURE:  The patient was brought to the operating room where general anesthetic was induced.  Preoperative antibiotics administered.  Timeout was called.  The patient was placed in the beach chair position with head in neutral position.   Left shoulder examined.  Found to have excellent range of motion passively of 70/100/170.  Shoulder stability was also good anterior and posterior stress.  Left shoulder was then pre-scrubbed with alcohol and Betadine, allowed to air dry, prepped with  DuraPrep solution and draped in sterile manner.  [name] was used to seal the operative field and cover the axilla.  After calling timeout posterior portal was created 2 cm medial and inferior to the posterolateral margin of the acromion.  Diagnostic  arthroscopy was performed.  Anterior portal created under direct visualization.  Some synovitis was present, but it did not look like a frozen shoulder.   Unstable degenerative superior labral tear was present with subluxation of the superior biceps  attachment site into the joint.  The patient also had grade 2-3 chondromalacia on the humeral articular surfaces of about 33%.  No corresponding glenoid changes.  The anterior inferior and posterior inferior glenohumeral ligaments were intact.  The  superior labrum was debrided and released with an Arthrocare wand.  The cartilage was debrided with the shaver.  Following this, intraarticular debridement instruments were then placed into the subacromial space.  Lateral portal was created under direct  visualization.  Bursectomy and subacromial decompression was performed with an Arthrocare wand and a bur.  Then, at this time going in through the anterior portal, the distal 9-10 mm of the clavicle was resected keeping intact the posterior and superior  ligaments.  Subacromial space was thoroughly irrigated.  Instruments were removed, and portals were closed using 3-0 nylon.  Ioban then placed over top of the shoulder.  Incision made off the anterolateral margin of the acromion.  Skin and subcutaneous  tissue sharply divided.  Stay suture placed to measure distance of 4 cm between the middle and anterior raphae.  That raphae was then divided and self-retaining retractor was placed.  Bursectomy completed.  Subacromial decompression felt very good.   Distal clavicle excision also felt good.  At this time, the biceps tendon was tenodesed under appropriate tension into the bicipital groove using 1 knotless Arthrex SutureTak distally and then a second 1 proximally.  This was after we placed FiberLock  suture into the biceps tendon.  Next, after appropriate tension and oversewing that with a 0 Vicryl suture the incision was thoroughly irrigated.  Instruments were removed.  The deltoid was closed using #1 Vicryl  suture followed by interrupted inverted 0  Vicryl suture, 2-0 Vicryl suture, and 3-0 Monocryl with Steri-Strips  and impervious dressings applied.  [name]'s assistance was required at all times for retraction, opening, closing, mobilization of tissue.  His assistance was a medical necessity.   PUS D: 10/29/2022 1:35:08 pm T: 10/29/2022 2:45:00 pm  JOB: [id number]/ [id number]

## 2022-10-29 NOTE — Anesthesia Preprocedure Evaluation (Addendum)
Anesthesia Evaluation  Patient identified by MRN, date of birth, ID band Patient awake    Reviewed: Allergy & Precautions, NPO status , Patient's Chart, lab work & pertinent test results  Airway Mallampati: I  TM Distance: >3 FB Neck ROM: Full    Dental no notable dental hx. (+) Teeth Intact, Dental Advisory Given   Pulmonary asthma , sleep apnea and Continuous Positive Airway Pressure Ventilation , former smoker   Pulmonary exam normal breath sounds clear to auscultation       Cardiovascular hypertension, Pt. on medications Normal cardiovascular exam Rhythm:Regular Rate:Normal  TTE 2017 - Procedure narrative: Transthoracic echocardiography. Image    quality was suboptimal. Intravenous contrast (Definity) was    administered.  - Left ventricle: The cavity size was normal. There was mild    concentric hypertrophy. Systolic function was normal. The    estimated ejection fraction was in the range of 60% to 65%. Wall    motion was normal; there were no regional wall motion    abnormalities. Doppler parameters are consistent with abnormal    left ventricular relaxation (grade 1 diastolic dysfunction)   Stress Test 2018 Normal study, no evidence for ischemia or infarction.     Neuro/Psych  Headaches PSYCHIATRIC DISORDERS  Depression       GI/Hepatic Neg liver ROS,GERD  ,,  Endo/Other  diabetes, Type 2, Oral Hypoglycemic Agents    Renal/GU negative Renal ROS  negative genitourinary   Musculoskeletal  (+) Arthritis ,  Fibromyalgia -  Abdominal   Peds  Hematology negative hematology ROS (+)   Anesthesia Other Findings   Reproductive/Obstetrics                             Anesthesia Physical Anesthesia Plan  ASA: 3  Anesthesia Plan: General and Regional   Post-op Pain Management: Regional block* and Tylenol PO (pre-op)*   Induction: Intravenous  PONV Risk Score and Plan: 3 and  Midazolam, Dexamethasone and Ondansetron  Airway Management Planned: Oral ETT  Additional Equipment:   Intra-op Plan:   Post-operative Plan: Extubation in OR  Informed Consent: I have reviewed the patients History and Physical, chart, labs and discussed the procedure including the risks, benefits and alternatives for the proposed anesthesia with the patient or authorized representative who has indicated his/her understanding and acceptance.     Dental advisory given  Plan Discussed with: CRNA  Anesthesia Plan Comments:        Anesthesia Quick Evaluation

## 2022-10-30 ENCOUNTER — Encounter (HOSPITAL_COMMUNITY): Payer: Self-pay | Admitting: Orthopedic Surgery

## 2022-10-31 ENCOUNTER — Other Ambulatory Visit (HOSPITAL_COMMUNITY): Payer: Self-pay

## 2022-11-05 ENCOUNTER — Other Ambulatory Visit (HOSPITAL_COMMUNITY): Payer: Self-pay

## 2022-11-07 DIAGNOSIS — M65812 Other synovitis and tenosynovitis, left shoulder: Secondary | ICD-10-CM

## 2022-11-07 DIAGNOSIS — M19012 Primary osteoarthritis, left shoulder: Secondary | ICD-10-CM

## 2022-11-07 DIAGNOSIS — M7522 Bicipital tendinitis, left shoulder: Secondary | ICD-10-CM

## 2022-11-07 DIAGNOSIS — M65912 Unspecified synovitis and tenosynovitis, left shoulder: Secondary | ICD-10-CM

## 2022-11-07 DIAGNOSIS — S43432A Superior glenoid labrum lesion of left shoulder, initial encounter: Secondary | ICD-10-CM

## 2022-11-08 ENCOUNTER — Encounter: Payer: 59 | Admitting: Surgical

## 2022-11-12 ENCOUNTER — Telehealth: Payer: Self-pay | Admitting: Orthopedic Surgery

## 2022-11-12 NOTE — Telephone Encounter (Signed)
[place] forms received. To [employer]. ?

## 2022-11-13 ENCOUNTER — Ambulatory Visit (INDEPENDENT_AMBULATORY_CARE_PROVIDER_SITE_OTHER): Payer: Commercial Managed Care - PPO | Admitting: Surgical

## 2022-11-13 ENCOUNTER — Ambulatory Visit (INDEPENDENT_AMBULATORY_CARE_PROVIDER_SITE_OTHER): Payer: Commercial Managed Care - PPO | Admitting: Orthopedic Surgery

## 2022-11-13 ENCOUNTER — Encounter: Payer: Self-pay | Admitting: Surgical

## 2022-11-13 DIAGNOSIS — G8929 Other chronic pain: Secondary | ICD-10-CM

## 2022-11-13 DIAGNOSIS — M545 Low back pain, unspecified: Secondary | ICD-10-CM

## 2022-11-13 DIAGNOSIS — M7522 Bicipital tendinitis, left shoulder: Secondary | ICD-10-CM

## 2022-11-13 NOTE — Progress Notes (Signed)
Post-Op Visit Note   Patient: Cristina [name]           Date of Birth: July 29, 1961           MRN: [id number] Visit Date: 11/13/2022 PCP: [name], MD   Assessment & Plan:  Chief Complaint:  Chief Complaint  Patient presents with   Left Shoulder - Routine Post Op   Visit Diagnoses: No diagnosis found.  Plan: Cristina [name] is a 62 y.o. female who presents s/p left shoulder distal clavicle excision and mini open bicep tenodesis on 10/29/2022.  Patient is doing well and pain is overall controlled.  Denies any chest pain, SOB, fevers, chills.    On exam, patient has range of motion 15 degrees external rotation, 75 degrees abduction, 100 degrees forward elevation.  Intact EPL, FPL, finger abduction, finger adduction, pronation/supination, bicep, tricep, deltoid of operative extremity.  Axillary nerve intact with deltoid firing.  Incisions are healing well without evidence of infection or dehiscence.  Sutures removed and replaced with Steri-Strips today.  2+ radial pulse of the operative extremity  Plan is continue with CPM machine for 2 more weeks.  After that, use combination of CPM and home exercise program from she does given to patient today.  Follow-up with Dr. Marlou Sa in 4 weeks.  No lifting heavy with the operative arm.  No overhead lifting.  Call with any concerns in the meantime.  Recommended holding off on any driving.  She will call if she wants any formal physical therapy.  Okay for full active range of motion and passive range of motion of the operative arm but no range of motion behind the back yet..   Follow-Up Instructions: No follow-ups on file.   Orders:  No orders of the defined types were placed in this encounter.  No orders of the defined types were placed in this encounter.   Imaging: No results found.  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   Morbid obesity with body mass index (BMI) of 45.0 to 49.9 in adult [facility]) 07/06/2018   Type 2 diabetes mellitus without complication, without long-term current use of insulin ([place]) 05/22/2017   Class 3 obesity with serious comorbidity and body mass index (BMI) of 40.0 to 44.9 in adult 05/21/2017   Family history of colon cancer 04/01/2017   Irritable bowel syndrome with diarrhea 04/01/2017   NSAID long-term use 04/01/2017   Lymphocytosis 02/24/2017   Essential hypertension 02/12/2017   Mild intermittent asthma with acute exacerbation 12/12/2016   Left shoulder pain 06/02/2015   Insomnia 05/12/2014   Fibromyalgia 05/12/2014   Allergic rhinitis 03/28/2014   Osteoarthritis 12/20/2013   Primary osteoarthritis of both knees 12/20/2013   Cervical strain 06/21/2013   Low back pain 06/21/2013   Metatarsalgia of both feet 03/11/2013   Sciatica 01/25/2013   Restless leg syndrome 11/24/2012   Pure hypercholesterolemia 10/20/2012   OSA on CPAP 10/20/2012   Depression 10/20/2012   Iron deficiency anemia, unspecified 10/01/2012  Snoring 10/01/2012   Diabetes mellitus, type 2 ([place]) 12/31/2010   ANAL FISSURE 12/31/2010   Morbid obesity ([place]) 01/08/2007   CONSTIPATION 01/08/2007   Past Medical History:  Diagnosis Date   Allergic rhinitis, cause unspecified    Anemia    Arthritis    Asthma    Back pain    Chest pain    Constipation    CTS (carpal tunnel syndrome)    Cystocele    Depression    Diabetes mellitus    Dysfunction of eustachian tube     Essential hypertension 02/12/2017   Family history of adverse reaction to anesthesia    daughter hard to wake up   Fatty liver    Fibromyalgia    Fissure, anal    Gallbladder problem    Genital herpes    GERD (gastroesophageal reflux disease)    Headache    Hemorrhoid    Hx of migraine headaches    Hyperlipidemia    IBS (irritable bowel syndrome)    Insomnia    Iron deficiency anemia, unspecified    Joint pain    Nausea    Obesity    OSA on CPAP    Osteoarthritis    Rectocele    Sleep apnea    lost weight, does not use CPAP   TMJ (dislocation of temporomandibular joint)     Family History  Problem Relation Age of Onset   Hypertension Mother 4   Osteoarthritis Mother    Diabetes Mother    Colon cancer Mother 50   Cancer Mother 50       colon cancer   Hyperlipidemia Mother    Obesity Mother    Sudden death Mother    Arthritis Mother    Colon polyps Brother        x 2 brother   Cirrhosis Brother        alcoholism   Alcohol abuse Brother    Sleep apnea Brother    Hyperlipidemia Brother    Migraines Brother    Obstructive Sleep Apnea Brother    Colon cancer Paternal Aunt    Stroke Maternal Grandmother    Colon cancer Paternal Grandmother    Cancer Paternal Grandmother        colon cancer   Diabetes Daughter    Hypertension Daughter    Sleep apnea Daughter    Mental illness Daughter        depression   Migraines Daughter    Migraines Daughter    Mental illness Daughter        anxiety attacks   Protein S deficiency Daughter    Esophageal cancer Neg Hx    Stomach cancer Neg Hx    Pancreatic cancer Neg Hx     Past Surgical History:  Procedure Laterality Date   ABDOMINAL HYSTERECTOMY  11/11/2010   Marvel Plan.  Ovaries intact.  Uterine fibroids with DUB.   BICEPT TENODESIS Left 10/29/2022   Procedure: BICEPS TENODESIS;  Surgeon: Meredith Pel, MD;  Location: [place];  Service: Orthopedics;  Laterality: Left;   CARPAL TUNNEL RELEASE  11/12/1987   Left    CHOLECYSTECTOMY     CHOLECYSTECTOMY     COLONOSCOPY  06/29/2012   normal.  Repeat in 5 years.   DILATION AND CURETTAGE OF UTERUS     ECTOPIC PREGNANCY SURGERY  11/12/1991   ESOPHAGOGASTRODUODENOSCOPY  12/13/2011   dysphagia.  Henrene Pastor.  Normal.   GASTRIC ROUX-EN-Y N/A 07/06/2018   Procedure: LAPAROSCOPIC ROUX-EN-Y GASTRIC BYPASS WITH UPPER  ENDOSCOPY;  Surgeon: Excell Seltzer, MD;  Location: [facility];  Service: General;  Laterality: N/A;   SHOULDER ARTHROSCOPY WITH OPEN ROTATOR CUFF REPAIR AND DISTAL CLAVICLE ACROMINECTOMY Left 10/29/2022   Procedure: LEFT SHOULDER ACROMIOCLAVICULAR RESECTION, DEBRIDEMENT;  Surgeon: Meredith Pel, MD;  Location: [place];  Service: Orthopedics;  Laterality: Left;   Sleep study  09/11/2012   severe OSA.  CPAP titration at 12 cm water pressure.   TOTAL KNEE ARTHROPLASTY Left 05/31/2021   Procedure: LEFT TOTAL KNEE ARTHROPLASTY;  Surgeon: Mcarthur Rossetti, MD;  Location: [place];  Service: Orthopedics;  Laterality: Left;   TUBAL LIGATION  11/11/1986   Social History   Occupational History   Occupation: Audiological scientist: [employer]    Comment: cma/[employer]  Tobacco Use   Smoking status: Former    Types: Cigarettes   Smokeless tobacco: Never  Vaping Use   Vaping Use: Never used  Substance and Sexual Activity   Alcohol use: No    Alcohol/week: 1.0 standard drink of alcohol    Types: 1 Glasses of wine per week   Drug use: No   Sexual activity: Not Currently    Birth control/protection: None

## 2022-11-14 NOTE — Progress Notes (Signed)
Patient not seen at today's visit. Has rescheduled for another date.

## 2022-11-18 ENCOUNTER — Telehealth: Payer: Self-pay | Admitting: Orthopedic Surgery

## 2022-11-18 NOTE — Telephone Encounter (Signed)
Pt submitted to [employer] medical release forms, short term disability, and $25.00 cash payment on 11/18/2022

## 2022-11-21 ENCOUNTER — Encounter: Payer: Self-pay | Admitting: Orthopedic Surgery

## 2022-11-21 ENCOUNTER — Ambulatory Visit (INDEPENDENT_AMBULATORY_CARE_PROVIDER_SITE_OTHER): Payer: Commercial Managed Care - PPO

## 2022-11-21 ENCOUNTER — Ambulatory Visit (INDEPENDENT_AMBULATORY_CARE_PROVIDER_SITE_OTHER): Payer: Commercial Managed Care - PPO | Admitting: Orthopedic Surgery

## 2022-11-21 VITALS — BP 111/74 | HR 77 | Ht 60.0 in | Wt 164.0 lb

## 2022-11-21 DIAGNOSIS — M48062 Spinal stenosis, lumbar region with neurogenic claudication: Secondary | ICD-10-CM | POA: Diagnosis not present

## 2022-11-21 NOTE — Progress Notes (Signed)
Orthopedic Spine Surgery Office Note  Assessment: Patient is a 62 y.o. female with chronic low back pain that radiates into the lateral aspect of the hips bilaterally   Plan: -Explained that initially conservative treatment is tried as a significant number of patients may experience relief with these treatment modalities. Discussed that the conservative treatments include:  -activity modification  -physical therapy  -over the counter pain medications  -medrol dosepak  -lumbar steroid injections -Patient has tried activity modification, facet injections at L4-5 and L5-S1 (did not help at all), Tylenol -Recommended physical therapy with core strengthening to be done 3-4 times per week for the next 6 weeks -If she is not any better at her next appointment, will recommend MRI of the lumbar spine -If there is no significant compressive pathology on the MRI, will recommend continued core strengthening versus pain management referral -Patient should return to office in 6 weeks, x-rays at next visit: None   Patient expressed understanding of the plan and all questions were answered to the patient's satisfaction.   ___________________________________________________________________________   History:  Patient is a 62 y.o. female who presents today for lumbar spine.  Patient has had several years of low back pain that radiates into her bilateral lateral aspect of her hips.  There is no trauma or injury that brought on the pain.  Pain does not radiate any further than the lateral aspect of the hip.  She has no numbness or paresthesias in either leg.  Pain is felt on a daily basis.  She has tried Tylenol and activity modification and facet injections to L4-5 and L5-S1.  She does not get any lasting relief with any of these treatments.  Pain is worse if she is walking, standing/sitting for too long.  Pain does get better if she lays down.  Has not noticed any weakness.   Weakness: Denies Symptoms of  imbalance: Denies Paresthesias and numbness: Denies Bowel or bladder incontinence: Denies Saddle anesthesia: Denies  Treatments tried: Activity modification, facet injections, Tylenol  Review of systems: Denies fevers and chills, night sweats, unexplained weight loss, history of cancer.  Has had pain that wakes her at night  Past medical history: Allergic rhinitis Osteoarthritis Asthma Cystocele Depression Diabetes (last A1C was 6.3 on 10/21/2022) Hypertension GERD Fibromyalgia Herpes Anal fissure Irritable bowel syndrome Hyperlipidemia Obstructive sleep apnea Rectocele  Allergies: statins, fish oil  Past surgical history:  Gastric Roux-en-Y Hysterectomy Left CTR Left biceps tenodesis and RCR D&C of uterus Ectopic pregnancy surgery Tubal ligation Left TKA  Social history: Denies use of nicotine product (smoking, vaping, patches, smokeless) Alcohol use: denies Denies recreational drug use   Physical Exam:  General: no acute distress, appears stated age Neurologic: alert, answering questions appropriately, following commands Respiratory: unlabored breathing on room air, symmetric chest rise Psychiatric: appropriate affect, normal cadence to speech   MSK (spine):  -Strength exam      Left  Right EHL    5/5  5/5 TA    5/5  5/5 GSC    5/5  5/5 Knee extension  5/5  5/5 Hip flexion   5/5  5/5  -Sensory exam    Sensation intact to light touch in L3-S1 nerve distributions of bilateral lower extremities  -Achilles DTR: 1/4 on the left, 1/4 on the right -Patellar tendon DTR: 2/4 on the left, 2/4 on the right  -Straight leg raise: negative -Contralateral straight leg raise: negative -Femoral nerve stretch test: negative bilaterally -Clonus: no beats bilaterally  -Left hip exam: No pain  through range of motion, negative Stinchfield, negative Corky Sox -Right hip exam: No pain through range of motion, negative Stinchfield, negative FABER  Imaging: XR of  the lumbar spine from 11/21/2022 was independently reviewed and interpreted, showing spondylolisthesis at L4-5 that shifts about 1 mm between flexion and extension views.  No fracture or dislocation seen.  Lordotic alignment.  Disc height loss at L4/5.   Patient name: Cristina [name] Patient MRN: [id number] Date of visit: 11/21/22

## 2022-11-25 ENCOUNTER — Telehealth: Payer: Self-pay | Admitting: Orthopedic Surgery

## 2022-11-25 ENCOUNTER — Other Ambulatory Visit: Payer: Self-pay

## 2022-11-25 NOTE — Telephone Encounter (Signed)
Okay to return to work 12/09/2022 with no lifting with her left arm for 4 weeks.  Thanks

## 2022-11-25 NOTE — Telephone Encounter (Signed)
I called pt to advise that note is available in her mychart if she needs an actual signature to call and let us know.

## 2022-11-25 NOTE — Telephone Encounter (Signed)
Patient asked if she can get a work note to return back to work 12/09/2022. Patient asked if the note can list restrictions stating no lifting with her left arm.  Patient said she will print the note off of mychart.  The number to contact patient is [phone]

## 2022-11-25 NOTE — Telephone Encounter (Signed)
Received $25.00 cash,medical records release form,note and disability paperwork from patient/forwarding to [place] today

## 2022-11-29 ENCOUNTER — Other Ambulatory Visit: Payer: Self-pay

## 2022-12-02 ENCOUNTER — Other Ambulatory Visit (HOSPITAL_COMMUNITY): Payer: Self-pay

## 2022-12-11 ENCOUNTER — Ambulatory Visit (INDEPENDENT_AMBULATORY_CARE_PROVIDER_SITE_OTHER): Payer: Commercial Managed Care - PPO | Admitting: Orthopedic Surgery

## 2022-12-11 ENCOUNTER — Other Ambulatory Visit (HOSPITAL_COMMUNITY): Payer: Self-pay

## 2022-12-11 ENCOUNTER — Encounter: Payer: Self-pay | Admitting: Orthopedic Surgery

## 2022-12-11 DIAGNOSIS — M7522 Bicipital tendinitis, left shoulder: Secondary | ICD-10-CM

## 2022-12-11 MED ORDER — TRAMADOL HCL 50 MG PO TABS
50.0000 mg | ORAL_TABLET | Freq: Three times a day (TID) | ORAL | 0 refills | Status: AC | PRN
  Filled 2022-12-11: qty 30, 10d supply, fill #0

## 2022-12-11 NOTE — Progress Notes (Signed)
Post-Op Visit Note   Patient: Cristina [name]           Date of Birth: 06-13-1961           MRN: [id number] Visit Date: 12/11/2022 PCP: [name], MD   Assessment & Plan:  Chief Complaint:  Chief Complaint  Patient presents with   Left Shoulder - Follow-up, Routine Post Op    10/29/22 (6w 1d)Shoulder Acromioclavicular Resection, Debridement - Left  Biceps Tenodesis - Left      Visit Diagnoses: No diagnosis found.  Plan: Cristina [name] is a 62 year old patient with right shoulder arthroscopy with biceps tenodesis and distal clavicle excision.  She is having some pain in the biceps region along with some posterior arm pain.  On examination she has pretty reasonable shoulder range of motion.  No Popeye deformity.  No real discrete tenderness over the [facility] joint.  Rotator cuff strength is good.  Plan at this time is to go with work every other day for 2 weeks.  Would like for her to get a little bit farther out from surgery but I think she could benefit from ultrasound-guided glenohumeral joint injection.  She may be getting an early frozen shoulder.  Has some limitation of motion.  Follow-up for scheduled injection in 2 weeks.  Follow-Up Instructions: No follow-ups on file.   Orders:  No orders of the defined types were placed in this encounter.  Meds ordered this encounter  Medications   traMADol (ULTRAM) 50 MG tablet    Sig: Take 1 tablet (50 mg total) by mouth every 8 (eight) hours as needed.    Dispense:  30 tablet    Refill:  0    Imaging: No results found.  PMFS History: Patient Active Problem List   Diagnosis Date Noted   Synovitis of left shoulder 11/07/2022   Degenerative superior labral anterior-to-posterior (SLAP) tear of left shoulder 11/07/2022   Arthritis of left acromioclavicular joint 11/07/2022   Biceps tendinitis of left upper extremity 11/07/2022   Aortic atherosclerosis ([place]) 08/14/2022   Status post left knee replacement 06/01/2021   Status post total left  knee replacement 05/31/2021   Presence of left artificial knee joint 05/31/2021   Unilateral primary osteoarthritis, left knee 12/21/2020   S/P gastric bypass 08/01/2018   Morbid obesity with body mass index (BMI) of 45.0 to 49.9 in adult [facility]) 07/06/2018   Type 2 diabetes mellitus without complication, without long-term current use of insulin ([place]) 05/22/2017   Class 3 obesity with serious comorbidity and body mass index (BMI) of 40.0 to 44.9 in adult 05/21/2017   Family history of colon cancer 04/01/2017   Irritable bowel syndrome with diarrhea 04/01/2017   NSAID long-term use 04/01/2017   Lymphocytosis 02/24/2017   Essential hypertension 02/12/2017   Mild intermittent asthma with acute exacerbation 12/12/2016   Left shoulder pain 06/02/2015   Insomnia 05/12/2014   Fibromyalgia 05/12/2014   Allergic rhinitis 03/28/2014   Osteoarthritis 12/20/2013   Primary osteoarthritis of both knees 12/20/2013   Cervical strain 06/21/2013   Low back pain 06/21/2013   Metatarsalgia of both feet 03/11/2013   Sciatica 01/25/2013   Restless leg syndrome 11/24/2012   Pure hypercholesterolemia 10/20/2012   OSA on CPAP 10/20/2012   Depression 10/20/2012   Iron deficiency anemia, unspecified 10/01/2012   Snoring 10/01/2012   Diabetes mellitus, type 2 ([place]) 12/31/2010   ANAL FISSURE 12/31/2010   Morbid obesity ([place]) 01/08/2007   CONSTIPATION 01/08/2007   Past Medical History:  Diagnosis Date  Allergic rhinitis, cause unspecified    Anemia    Arthritis    Asthma    Back pain    Chest pain    Constipation    CTS (carpal tunnel syndrome)    Cystocele    Depression    Diabetes mellitus    Dysfunction of eustachian tube    Essential hypertension 02/12/2017   Family history of adverse reaction to anesthesia    daughter hard to wake up   Fatty liver    Fibromyalgia    Fissure, anal    Gallbladder problem    Genital herpes    GERD (gastroesophageal reflux disease)    Headache     Hemorrhoid    Hx of migraine headaches    Hyperlipidemia    IBS (irritable bowel syndrome)    Insomnia    Iron deficiency anemia, unspecified    Joint pain    Nausea    Obesity    OSA on CPAP    Osteoarthritis    Rectocele    Sleep apnea    lost weight, does not use CPAP   TMJ (dislocation of temporomandibular joint)     Family History  Problem Relation Age of Onset   Hypertension Mother 82   Osteoarthritis Mother    Diabetes Mother    Colon cancer Mother 6   Cancer Mother 53       colon cancer   Hyperlipidemia Mother    Obesity Mother    Sudden death Mother    Arthritis Mother    Colon polyps Brother        x 2 brother   Cirrhosis Brother        alcoholism   Alcohol abuse Brother    Sleep apnea Brother    Hyperlipidemia Brother    Migraines Brother    Obstructive Sleep Apnea Brother    Colon cancer Paternal Aunt    Stroke Maternal Grandmother    Colon cancer Paternal Grandmother    Cancer Paternal Grandmother        colon cancer   Diabetes Daughter    Hypertension Daughter    Sleep apnea Daughter    Mental illness Daughter        depression   Migraines Daughter    Migraines Daughter    Mental illness Daughter        anxiety attacks   Protein S deficiency Daughter    Esophageal cancer Neg Hx    Stomach cancer Neg Hx    Pancreatic cancer Neg Hx     Past Surgical History:  Procedure Laterality Date   ABDOMINAL HYSTERECTOMY  11/11/2010   [place] Plan.  Ovaries intact.  Uterine fibroids with DUB.   BICEPT TENODESIS Left 10/29/2022   Procedure: BICEPS TENODESIS;  Surgeon: Meredith Pel, MD;  Location: [facility];  Service: Orthopedics;  Laterality: Left;   CARPAL TUNNEL RELEASE  11/12/1987   Left   CHOLECYSTECTOMY     CHOLECYSTECTOMY     COLONOSCOPY  06/29/2012   normal.  Repeat in 5 years.   DILATION AND CURETTAGE OF UTERUS     ECTOPIC PREGNANCY SURGERY  11/12/1991   ESOPHAGOGASTRODUODENOSCOPY  12/13/2011   dysphagia.  Henrene Pastor.  Normal.   GASTRIC  ROUX-EN-Y N/A 07/06/2018   Procedure: LAPAROSCOPIC ROUX-EN-Y GASTRIC BYPASS WITH UPPER ENDOSCOPY;  Surgeon: Excell Seltzer, MD;  Location: [facility];  Service: General;  Laterality: N/A;   SHOULDER ARTHROSCOPY WITH OPEN ROTATOR CUFF REPAIR AND DISTAL CLAVICLE ACROMINECTOMY Left 10/29/2022   Procedure: LEFT  SHOULDER ACROMIOCLAVICULAR RESECTION, DEBRIDEMENT;  Surgeon: Meredith Pel, MD;  Location: [place];  Service: Orthopedics;  Laterality: Left;   Sleep study  09/11/2012   severe OSA.  CPAP titration at 12 cm water pressure.   TOTAL KNEE ARTHROPLASTY Left 05/31/2021   Procedure: LEFT TOTAL KNEE ARTHROPLASTY;  Surgeon: Mcarthur Rossetti, MD;  Location: [place];  Service: Orthopedics;  Laterality: Left;   TUBAL LIGATION  11/11/1986   Social History   Occupational History   Occupation: Audiological scientist: [employer]    Comment: cma/[employer]  Tobacco Use   Smoking status: Former    Types: Cigarettes   Smokeless tobacco: Never  Vaping Use   Vaping Use: Never used  Substance and Sexual Activity   Alcohol use: No    Alcohol/week: 1.0 standard drink of alcohol    Types: 1 Glasses of wine per week   Drug use: No   Sexual activity: Not Currently    Birth control/protection: None

## 2022-12-12 ENCOUNTER — Other Ambulatory Visit (HOSPITAL_COMMUNITY): Payer: Self-pay

## 2022-12-12 ENCOUNTER — Telehealth: Payer: Self-pay | Admitting: Orthopedic Surgery

## 2022-12-12 NOTE — Telephone Encounter (Signed)
[employer] forms received. To [employer]. 

## 2022-12-24 ENCOUNTER — Other Ambulatory Visit (HOSPITAL_COMMUNITY): Payer: Self-pay

## 2022-12-25 ENCOUNTER — Other Ambulatory Visit (HOSPITAL_COMMUNITY): Payer: Self-pay

## 2022-12-25 ENCOUNTER — Other Ambulatory Visit: Payer: Self-pay

## 2022-12-25 ENCOUNTER — Encounter: Payer: Self-pay | Admitting: Orthopedic Surgery

## 2022-12-25 ENCOUNTER — Ambulatory Visit (INDEPENDENT_AMBULATORY_CARE_PROVIDER_SITE_OTHER): Payer: Commercial Managed Care - PPO | Admitting: Orthopedic Surgery

## 2022-12-25 ENCOUNTER — Ambulatory Visit: Payer: Self-pay

## 2022-12-25 DIAGNOSIS — H538 Other visual disturbances: Secondary | ICD-10-CM | POA: Diagnosis not present

## 2022-12-25 DIAGNOSIS — H0102B Squamous blepharitis left eye, upper and lower eyelids: Secondary | ICD-10-CM | POA: Diagnosis not present

## 2022-12-25 DIAGNOSIS — M7522 Bicipital tendinitis, left shoulder: Secondary | ICD-10-CM

## 2022-12-25 DIAGNOSIS — E119 Type 2 diabetes mellitus without complications: Secondary | ICD-10-CM | POA: Diagnosis not present

## 2022-12-25 DIAGNOSIS — H0102A Squamous blepharitis right eye, upper and lower eyelids: Secondary | ICD-10-CM | POA: Diagnosis not present

## 2022-12-25 MED ORDER — METHYLPREDNISOLONE ACETATE 40 MG/ML IJ SUSP
40.0000 mg | INTRAMUSCULAR | Status: AC | PRN
  Administered 2022-12-25: 40 mg via INTRA_ARTICULAR

## 2022-12-25 MED ORDER — BUPIVACAINE HCL 0.5 % IJ SOLN
9.0000 mL | INTRAMUSCULAR | Status: AC | PRN
  Administered 2022-12-25: 9 mL via INTRA_ARTICULAR

## 2022-12-25 MED ORDER — LIDOCAINE HCL 1 % IJ SOLN
5.0000 mL | INTRAMUSCULAR | Status: AC | PRN
  Administered 2022-12-25: 5 mL

## 2022-12-25 NOTE — Progress Notes (Signed)
   Procedure Note  Patient: Cristina [name]             Date of Birth: 03-20-1961           MRN: [id number]             Visit Date: 12/25/2022  Procedures: Visit Diagnoses:  1. Biceps tendinitis of left upper extremity     Large Joint Inj: L glenohumeral on 12/25/2022 10:41 AM Indications: diagnostic evaluation and pain Details: 22 G 1.5 in and 3.5 in needle, ultrasound-guided posterior approach  Arthrogram: No  Medications: 9 mL bupivacaine 0.5 %; 40 mg methylPREDNISolone acetate 40 MG/ML; 5 mL lidocaine 1 % Outcome: tolerated well, no immediate complications  Patient returns for planned left glenohumeral injection.  Range of motion on exam today is 15 degrees X rotation, 70 degrees abduction, 110 degrees forward elevation.  She will continue with home exercise program after this glenohumeral injection.  Return to work next Monday Procedure, treatment alternatives, risks and benefits explained, specific risks discussed. Consent was given by the patient. Immediately prior to procedure a time out was called to verify the correct patient, procedure, equipment, support staff and site/side marked as required. Patient was prepped and draped in the usual sterile fashion.     [name] participated in the care of this patient

## 2023-01-01 ENCOUNTER — Other Ambulatory Visit: Payer: Self-pay

## 2023-01-02 ENCOUNTER — Ambulatory Visit (INDEPENDENT_AMBULATORY_CARE_PROVIDER_SITE_OTHER): Payer: Commercial Managed Care - PPO | Admitting: Orthopedic Surgery

## 2023-01-02 ENCOUNTER — Other Ambulatory Visit (HOSPITAL_COMMUNITY): Payer: Self-pay

## 2023-01-02 DIAGNOSIS — M5416 Radiculopathy, lumbar region: Secondary | ICD-10-CM

## 2023-01-02 MED ORDER — PREGABALIN 50 MG PO CAPS
50.0000 mg | ORAL_CAPSULE | Freq: Three times a day (TID) | ORAL | 0 refills | Status: DC
  Filled 2023-01-02: qty 90, 30d supply, fill #0

## 2023-01-02 NOTE — Progress Notes (Signed)
Orthopedic Spine Surgery Office Note  Assessment: Patient is a 62 y.o. female with low back pain that radiates into her bilateral lateral hips (left >right).  Has L4/5 spondylolisthesis.  Possible radiculopathy   Plan: -Explained that initially conservative treatment is tried as a significant number of patients may experience relief with these treatment modalities. Discussed that the conservative treatments include:  -activity modification  -physical therapy  -over the counter pain medications  -medrol dosepak  -lumbar steroid injections -Patient has tried activity modification, facet injections, Tylenol, PT, core strengthening, gabapentin -At this point, I was wanting to get an MRI to evaluate her radiculopathy.  I explained that this would be for preoperative planning but patient is not interested in surgery of any kind at this time.  Accordingly, I told her that her options would be left L4/5 transforaminal injection, Lyrica, or pain management.  She elected to proceed with Lyrica.  A prescription was provided to her today.  She should discontinue the gabapentin that she has been taking -Patient should return to office in 8 weeks, x-rays at next visit: none   Patient expressed understanding of the plan and all questions were answered to the patient's satisfaction.   ___________________________________________________________________________  History: Patient is a 62 y.o. female who has been previously seen in the office for symptoms concerning for lumbar radiculopathy.  Pain is felt in her low back and radiates into her bilateral lateral hips.  It is much more significant on the left side.  Her right side is tolerable.  Pain is worse with activity including standing and walking.  It does get better if she sits down or lays down.  However, it does not completely go away with sitting or laying.  Denies paresthesias and numbness.  No saddle anesthesia no bowel or bladder incontinence.  At her  last visit, I recommended physical therapy and core strengthening.  She states that she tried this and did not get any relief.  Her pain is exactly the same as it was at her last visit.  Previous treatments: activity modification, facet injections, Tylenol, PT, core strengthening, gabapentin  Physical Exam:  General: no acute distress, appears stated age Neurologic: alert, answering questions appropriately, following commands Respiratory: unlabored breathing on room air, symmetric chest rise Psychiatric: appropriate affect, normal cadence to speech   MSK (spine):  -Strength exam      Left  Right EHL    5/5  5/5 TA    5/5  5/5 GSC    5/5  5/5 Knee extension  5/5  5/5 Hip flexion   5/5  5/5  -Sensory exam    Sensation intact to light touch in L3-S1 nerve distributions of bilateral lower extremities  -Achilles DTR: 1/4 on the left, 1/4 on the right -Patellar tendon DTR: 2/4 on the left, 2/4 on the right  -Straight leg raise: negative -Contralateral straight leg raise: negative -Femoral nerve stretch test: negative bilaterally -Clonus: no beats bilaterally  -Left hip exam: No pain through range of motion, negative Stinchfield, negative Faber, negative SI joint compression test -Right hip exam: No pain through range of motion, negative Stinchfield, negative FABER, negative SI joint compression test  Imaging: XR of the lumbar spine from 11/21/2022 was previously independently reviewed and interpreted, showing spondylolisthesis at L4-5 that shifts about 1 mm between flexion and extension views.  No fracture or dislocation seen.  Lordotic alignment.  Disc height loss at L4/5.    Patient name: Cristina [name] Patient MRN: [id number] Date of visit: 01/02/23

## 2023-01-03 ENCOUNTER — Other Ambulatory Visit (HOSPITAL_COMMUNITY): Payer: Self-pay

## 2023-01-08 ENCOUNTER — Other Ambulatory Visit: Payer: Self-pay | Admitting: Pharmacist

## 2023-01-08 ENCOUNTER — Other Ambulatory Visit (HOSPITAL_COMMUNITY): Payer: Self-pay

## 2023-01-08 MED ORDER — SEMAGLUTIDE-WEIGHT MANAGEMENT 2.4 MG/0.75ML ~~LOC~~ SOAJ
2.4000 mg | SUBCUTANEOUS | 0 refills | Status: DC
  Filled 2023-01-08 (×2): qty 9, 84d supply, fill #0
  Filled 2023-01-23: qty 3, 28d supply, fill #0
  Filled 2023-02-19: qty 3, 28d supply, fill #1

## 2023-01-15 ENCOUNTER — Other Ambulatory Visit (HOSPITAL_COMMUNITY): Payer: Self-pay

## 2023-01-20 ENCOUNTER — Other Ambulatory Visit (HOSPITAL_COMMUNITY): Payer: Self-pay

## 2023-01-21 ENCOUNTER — Other Ambulatory Visit: Payer: Self-pay

## 2023-01-21 ENCOUNTER — Telehealth: Payer: Self-pay | Admitting: Orthopedic Surgery

## 2023-01-21 ENCOUNTER — Other Ambulatory Visit (HOSPITAL_COMMUNITY): Payer: Self-pay

## 2023-01-21 NOTE — Telephone Encounter (Signed)
[employer] forms received. To [employer]. 

## 2023-01-22 ENCOUNTER — Telehealth: Payer: Self-pay | Admitting: Orthopedic Surgery

## 2023-01-22 NOTE — Telephone Encounter (Signed)
Received [id number] cash & form. To [employer].

## 2023-01-23 ENCOUNTER — Other Ambulatory Visit (HOSPITAL_COMMUNITY): Payer: Self-pay

## 2023-01-23 ENCOUNTER — Other Ambulatory Visit: Payer: Self-pay

## 2023-01-23 MED ORDER — ESCITALOPRAM OXALATE 10 MG PO TABS
10.0000 mg | ORAL_TABLET | Freq: Every day | ORAL | 4 refills | Status: DC
  Filled 2023-03-31: qty 90, 90d supply, fill #0

## 2023-02-04 ENCOUNTER — Other Ambulatory Visit (HOSPITAL_COMMUNITY): Payer: Self-pay

## 2023-02-05 ENCOUNTER — Encounter: Payer: Self-pay | Admitting: Orthopedic Surgery

## 2023-02-05 ENCOUNTER — Ambulatory Visit (INDEPENDENT_AMBULATORY_CARE_PROVIDER_SITE_OTHER): Payer: Commercial Managed Care - PPO | Admitting: Surgical

## 2023-02-05 DIAGNOSIS — M7522 Bicipital tendinitis, left shoulder: Secondary | ICD-10-CM | POA: Diagnosis not present

## 2023-02-05 NOTE — Progress Notes (Signed)
Follow-up Office Visit Note   Patient: [name]           Date of Birth: [birth year]           MRN: [id number] Visit Date: 02/05/2023 Requested by: [name], MD [place],  [place] [id number] PCP: [name], MD  Subjective: Chief Complaint  Patient presents with   Left Shoulder - Follow-up    HPI: [name] is a 62 y.o. female who returns to the office for follow-up visit.    Plan at last visit was: Planned left glenohumeral injection  Since then, patient notes significant improvement from left shoulder pain from injection.  She states that she is not taking any medication for pain.  She is able to sleep through the night without difficulty.  She has decreased stiffness.  Really does not have any discomfort in her shoulder at this time.              ROS: All systems reviewed are negative as they relate to the chief complaint within the history of present illness.  Patient denies fevers or chills.  Assessment & Plan: Visit Diagnoses:  1. Biceps tendinitis of left upper extremity     Plan: SHYANA [name] is a 62 y.o. female who returns to the office for follow-up visit for left shoulder pain.  Plan from last visit was noted above in HPI.  They now return with significant improvement of left shoulder pain following glenohumeral injection.  She has really no significant discomfort of the left shoulder at this time.  Not taking any medications for pain.  Plan for her to follow-up with the office as needed.  Follow-Up Instructions: No follow-ups on file.   Orders:  No orders of the defined types were placed in this encounter.  No orders of the defined types were placed in this encounter.     Procedures: No procedures performed   Clinical Data: No additional findings.  Objective: Vital Signs: LMP 11/11/2010   Physical Exam:  Constitutional: Patient appears well-developed HEENT:  Head: Normocephalic Eyes:EOM are normal Neck: Normal  range of motion Cardiovascular: Normal rate Pulmonary/chest: Effort normal Neurologic: Patient is alert Skin: Skin is warm Psychiatric: Patient has normal mood and affect  Ortho Exam: Ortho exam demonstrates 30 degrees X rotation, 90 degrees abduction, 145 degrees forward elevation passively.  Excellent strength with supraspinatus, infraspinatus, subscapularis rated 5/5.  Axillary nerve intact with deltoid firing.  2+ radial pulse of the left upper extremity.  She has excellent supination and bicep flexion strength.  Well-healed incisions from prior surgery.  Specialty Comments:  No specialty comments available.  Imaging: No results found.   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/01/2018   Morbid obesity with body mass index (BMI) of 45.0 to 49.9 in adult [facility]) 07/06/2018   Type 2 diabetes mellitus without complication, without long-term current use of insulin ([place]) 05/22/2017   Class 3 obesity with serious comorbidity and body mass  index (BMI) of 40.0 to 44.9 in adult 05/21/2017   Family history of colon cancer 04/01/2017   Irritable bowel syndrome with diarrhea 04/01/2017   NSAID long-term use 04/01/2017   Lymphocytosis 02/24/2017   Essential hypertension 02/12/2017   Mild intermittent asthma with acute exacerbation 12/12/2016   Left shoulder pain 06/02/2015   Insomnia 05/12/2014   Fibromyalgia 05/12/2014   Allergic rhinitis 03/28/2014    Osteoarthritis 12/20/2013   Primary osteoarthritis of both knees 12/20/2013   Cervical strain 06/21/2013   Low back pain 06/21/2013   Metatarsalgia of both feet 03/11/2013   Sciatica 01/25/2013   Restless leg syndrome 11/24/2012   Pure hypercholesterolemia 10/20/2012   OSA on CPAP 10/20/2012   Depression 10/20/2012   Iron deficiency anemia, unspecified 10/01/2012   Snoring 10/01/2012   Diabetes mellitus, type 2 ([place]) 12/31/2010   ANAL FISSURE 12/31/2010   Morbid obesity ([place]) 01/08/2007   CONSTIPATION 01/08/2007   Past Medical History:  Diagnosis Date   Allergic rhinitis, cause unspecified    Anemia    Arthritis    Asthma    Back pain    Chest pain    Constipation    CTS (carpal tunnel syndrome)    Cystocele    Depression    Diabetes mellitus    Dysfunction of eustachian tube    Essential hypertension 02/12/2017   Family history of adverse reaction to anesthesia    daughter hard to wake up   Fatty liver    Fibromyalgia    Fissure, anal    Gallbladder problem    Genital herpes    GERD (gastroesophageal reflux disease)    Headache    Hemorrhoid    Hx of migraine headaches    Hyperlipidemia    IBS (irritable bowel syndrome)    Insomnia    Iron deficiency anemia, unspecified    Joint pain    Nausea    Obesity    OSA on CPAP    Osteoarthritis    Rectocele    Sleep apnea    lost weight, does not use CPAP   TMJ (dislocation of temporomandibular joint)     Family History  Problem Relation Age of Onset   Hypertension Mother 57   Osteoarthritis Mother    Diabetes Mother    Colon cancer Mother 46   Cancer Mother 20       colon cancer   Hyperlipidemia Mother    Obesity Mother    Sudden death Mother    Arthritis Mother    Colon polyps Brother        x 2 brother   Cirrhosis Brother        alcoholism   Alcohol abuse Brother    Sleep apnea Brother    Hyperlipidemia Brother    Migraines Brother    Obstructive Sleep Apnea Brother    Colon cancer Paternal  Aunt    Stroke Maternal Grandmother    Colon cancer Paternal Grandmother    Cancer Paternal Grandmother        colon cancer   Diabetes Daughter    Hypertension Daughter    Sleep apnea Daughter    Mental illness Daughter        depression   Migraines Daughter    Migraines Daughter    Mental illness Daughter        anxiety attacks   Protein S deficiency Daughter    Esophageal cancer Neg Hx    Stomach cancer Neg Hx    Pancreatic cancer Neg  Hx     Past Surgical History:  Procedure Laterality Date   ABDOMINAL HYSTERECTOMY  11/11/2010   Marvel Plan.  Ovaries intact.  Uterine fibroids with DUB.   BICEPT TENODESIS Left 10/29/2022   Procedure: BICEPS TENODESIS;  Surgeon: Meredith Pel, MD;  Location: [place];  Service: Orthopedics;  Laterality: Left;   CARPAL TUNNEL RELEASE  11/12/1987   Left   CHOLECYSTECTOMY     CHOLECYSTECTOMY     COLONOSCOPY  06/29/2012   normal.  Repeat in 5 years.   DILATION AND CURETTAGE OF UTERUS     ECTOPIC PREGNANCY SURGERY  11/12/1991   ESOPHAGOGASTRODUODENOSCOPY  12/13/2011   dysphagia.  Henrene Pastor.  Normal.   GASTRIC ROUX-EN-Y N/A 07/06/2018   Procedure: LAPAROSCOPIC ROUX-EN-Y GASTRIC BYPASS WITH UPPER ENDOSCOPY;  Surgeon: Excell Seltzer, MD;  Location: [facility];  Service: General;  Laterality: N/A;   SHOULDER ARTHROSCOPY WITH OPEN ROTATOR CUFF REPAIR AND DISTAL CLAVICLE ACROMINECTOMY Left 10/29/2022   Procedure: LEFT SHOULDER ACROMIOCLAVICULAR RESECTION, DEBRIDEMENT;  Surgeon: Meredith Pel, MD;  Location: [place];  Service: Orthopedics;  Laterality: Left;   Sleep study  09/11/2012   severe OSA.  CPAP titration at 12 cm water pressure.   TOTAL KNEE ARTHROPLASTY Left 05/31/2021   Procedure: LEFT TOTAL KNEE ARTHROPLASTY;  Surgeon: Mcarthur Rossetti, MD;  Location: [place];  Service: Orthopedics;  Laterality: Left;   TUBAL LIGATION  11/11/1986   Social History   Occupational History   Occupation: Audiological scientist: [employer]    Comment: c[employer]  Tobacco Use   Smoking status: Former    Types: Cigarettes   Smokeless tobacco: Never  Vaping Use   Vaping Use: Never used  Substance and Sexual Activity   Alcohol use: No    Alcohol/week: 1.0 standard drink of alcohol    Types: 1 Glasses of wine per week   Drug use: No   Sexual activity: Not Currently    Birth control/protection: None

## 2023-02-07 DIAGNOSIS — J00 Acute nasopharyngitis [common cold]: Secondary | ICD-10-CM | POA: Diagnosis not present

## 2023-02-13 ENCOUNTER — Encounter (HOSPITAL_COMMUNITY): Payer: Self-pay | Admitting: *Deleted

## 2023-02-19 ENCOUNTER — Other Ambulatory Visit (HOSPITAL_COMMUNITY): Payer: Self-pay

## 2023-02-25 ENCOUNTER — Other Ambulatory Visit: Payer: Self-pay

## 2023-02-25 ENCOUNTER — Other Ambulatory Visit (HOSPITAL_COMMUNITY): Payer: Self-pay

## 2023-02-27 ENCOUNTER — Ambulatory Visit (INDEPENDENT_AMBULATORY_CARE_PROVIDER_SITE_OTHER): Payer: Commercial Managed Care - PPO | Admitting: Orthopedic Surgery

## 2023-02-27 DIAGNOSIS — M5441 Lumbago with sciatica, right side: Secondary | ICD-10-CM | POA: Diagnosis not present

## 2023-02-27 DIAGNOSIS — M5442 Lumbago with sciatica, left side: Secondary | ICD-10-CM | POA: Diagnosis not present

## 2023-02-27 DIAGNOSIS — G8929 Other chronic pain: Secondary | ICD-10-CM

## 2023-02-27 NOTE — Progress Notes (Signed)
Orthopedic Spine Surgery Office Note  Assessment: Patient is a 62 y.o. female with low back pain that radiates into her bilateral lateral hips (left >right).  Has L4/5 spondylolisthesis.  Possible radiculopathy    Plan: -Patient has tried activity modification, facet injections, Tylenol, PT, core strengthening, gabapentin, lyrica -I presented her with her options at this point.  I told her that we could get an MRI and then discuss surgery or injections.  The other alternative would be to refer her to pain management.  After discussing her options, she wanted to try pain management.  A referral was provided to her today -Patient should return to office on an as-needed basis   Patient expressed understanding of the plan and all questions were answered to the patient's satisfaction.   ___________________________________________________________________________  History: Patient is a 62 y.o. female who has been previously seen in the office for low back pain that radiates into the bilateral lateral hips.  At her last visit, I presented with her several options and for treatment.  She elected to try Lyrica.  She states she tried that but did not get any relief.  She is still having pain in her low back that goes into her lateral hips.  She feels it on a daily basis.  No changes since the last time I saw her.  Denies paresthesias and numbness.  Pain does not radiate past the hips on either side.  Previous treatments: activity modification, facet injections, Tylenol, PT, core strengthening, gabapentin, lyrica   Physical Exam:  General: no acute distress, appears stated age Neurologic: alert, answering questions appropriately, following commands Respiratory: unlabored breathing on room air, symmetric chest rise Psychiatric: appropriate affect, normal cadence to speech   MSK (spine):  -Strength exam      Left  Right EHL    5/5  5/5 TA    5/5  5/5 GSC    5/5  5/5 Knee extension   5/5  5/5 Hip flexion   5/5  5/5  -Sensory exam    Sensation intact to light touch in L3-S1 nerve distributions of bilateral lower extremities  -Achilles DTR: 0/4 on the left, 1/4 on the right -Patellar tendon DTR: 1/4 on the left, 1/4 on the right  -Straight leg raise: negative bilaterally  -Femoral nerve stretch test: negative bilaterally -Clonus: no beats bilaterally   -Left hip exam: No pain through range of motion, negative Stinchfield, negative Faber, negative SI joint compression test -Right hip exam: No pain through range of motion, negative Stinchfield, negative FABER, negative SI joint compression test  Imaging: XR of the lumbar spine from 11/21/2022 was previously independently reviewed and interpreted, showing spondylolisthesis at L4-5 that shifts about 1 mm between flexion and extension views.  No fracture or dislocation seen.  Lordotic alignment.  Disc height loss at L4/5.    Patient name: Cristina [name] Patient MRN: [id number] Date of visit: 02/27/23

## 2023-02-28 ENCOUNTER — Other Ambulatory Visit (HOSPITAL_COMMUNITY): Payer: Self-pay

## 2023-03-04 ENCOUNTER — Encounter: Payer: Self-pay | Admitting: Orthopedic Surgery

## 2023-03-04 ENCOUNTER — Other Ambulatory Visit (HOSPITAL_COMMUNITY): Payer: Self-pay

## 2023-03-05 ENCOUNTER — Other Ambulatory Visit: Payer: Self-pay | Admitting: Radiology

## 2023-03-05 DIAGNOSIS — G8929 Other chronic pain: Secondary | ICD-10-CM

## 2023-03-05 DIAGNOSIS — M48062 Spinal stenosis, lumbar region with neurogenic claudication: Secondary | ICD-10-CM

## 2023-03-05 DIAGNOSIS — M5416 Radiculopathy, lumbar region: Secondary | ICD-10-CM

## 2023-03-06 ENCOUNTER — Other Ambulatory Visit (HOSPITAL_COMMUNITY): Payer: Self-pay

## 2023-03-11 ENCOUNTER — Encounter: Payer: Self-pay | Admitting: Physical Medicine & Rehabilitation

## 2023-03-14 ENCOUNTER — Telehealth: Payer: Self-pay | Admitting: Orthopedic Surgery

## 2023-03-14 NOTE — Telephone Encounter (Signed)
Patient is requesting to work 2 days/week, 8 hrs per day. Please advise. [employer] has forms. Thank you!

## 2023-03-14 NOTE — Telephone Encounter (Signed)
I think before reducing her work capacity, we would need to see her again since at her last visit she was doing very well following shoulder injection and had already returned to work at that point.  Is she wanting to limit her work due to her shoulder hurting or her back hurting or what is the reason?

## 2023-03-14 NOTE — Telephone Encounter (Signed)
I called pt. She stated she doesn't want to reduce hours but have its state that if her back hurts to allow her to take time off to rest. This is for Dr.Moore to due per pt.

## 2023-03-17 ENCOUNTER — Other Ambulatory Visit (HOSPITAL_COMMUNITY): Payer: Self-pay

## 2023-03-17 ENCOUNTER — Telehealth: Payer: Self-pay | Admitting: Pharmacist

## 2023-03-17 MED ORDER — MOUNJARO 10 MG/0.5ML ~~LOC~~ SOAJ
10.0000 mg | SUBCUTANEOUS | 0 refills | Status: DC
  Filled 2023-03-17: qty 2, 28d supply, fill #0

## 2023-03-17 NOTE — Telephone Encounter (Signed)
PA approved through 03/16/24. Pt aware, rx sent to pharmacy, counseled on different pen device. Can titrate monthly as tolerated.

## 2023-03-17 NOTE — Telephone Encounter (Signed)
Noted for [employer]. 

## 2023-03-17 NOTE — Telephone Encounter (Signed)
this patient has submitted FMLA forms for her back (to come from Dr. Christell Constant). She is requesting intermittent leave for flare-ups. Please advise if okay & the episodes approved and duration. Thank you!

## 2023-03-17 NOTE — Telephone Encounter (Signed)
Pt on Wegovy 2.4mg  weekly and tolerating well, insurance no longer covering weight loss GLPs. Wants to change to [facility]. Has DM2, A1c was 8.7% in 2019, most recently improved to 6.3% on 10/21/22. Will submit PA for [facility] and let pt know once approved, Key: [id number]. Will transition to 10mg  weekly dose then titrate as tolerated.

## 2023-03-19 DIAGNOSIS — Z0000 Encounter for general adult medical examination without abnormal findings: Secondary | ICD-10-CM | POA: Diagnosis not present

## 2023-03-19 DIAGNOSIS — E119 Type 2 diabetes mellitus without complications: Secondary | ICD-10-CM | POA: Diagnosis not present

## 2023-03-19 DIAGNOSIS — F331 Major depressive disorder, recurrent, moderate: Secondary | ICD-10-CM | POA: Diagnosis not present

## 2023-03-20 ENCOUNTER — Other Ambulatory Visit: Payer: Self-pay | Admitting: Family Medicine

## 2023-03-20 ENCOUNTER — Ambulatory Visit: Payer: Commercial Managed Care - PPO | Attending: Cardiology

## 2023-03-20 ENCOUNTER — Other Ambulatory Visit (HOSPITAL_COMMUNITY): Payer: Self-pay

## 2023-03-20 ENCOUNTER — Other Ambulatory Visit: Payer: Self-pay | Admitting: *Deleted

## 2023-03-20 DIAGNOSIS — E1169 Type 2 diabetes mellitus with other specified complication: Secondary | ICD-10-CM

## 2023-03-20 DIAGNOSIS — E7800 Pure hypercholesterolemia, unspecified: Secondary | ICD-10-CM

## 2023-03-20 DIAGNOSIS — Z79899 Other long term (current) drug therapy: Secondary | ICD-10-CM

## 2023-03-20 DIAGNOSIS — I10 Essential (primary) hypertension: Secondary | ICD-10-CM

## 2023-03-20 DIAGNOSIS — E119 Type 2 diabetes mellitus without complications: Secondary | ICD-10-CM | POA: Diagnosis not present

## 2023-03-20 DIAGNOSIS — I700 Atherosclerosis of aorta: Secondary | ICD-10-CM

## 2023-03-20 DIAGNOSIS — Z0189 Encounter for other specified special examinations: Secondary | ICD-10-CM

## 2023-03-20 DIAGNOSIS — E785 Hyperlipidemia, unspecified: Secondary | ICD-10-CM | POA: Diagnosis not present

## 2023-03-20 DIAGNOSIS — Z1231 Encounter for screening mammogram for malignant neoplasm of breast: Secondary | ICD-10-CM

## 2023-03-20 NOTE — Progress Notes (Signed)
Order from pts PCP to have Dr. Shari Prows check a CBC W DIFF and CMET on the pt, for management of her diabetes.   Orders placed and the pt will report to our lab now to have this drawn.

## 2023-03-21 LAB — CBC WITH DIFFERENTIAL/PLATELET
Basophils Absolute: 0.1 10*3/uL (ref 0.0–0.2)
Basos: 1 %
EOS (ABSOLUTE): 0.2 10*3/uL (ref 0.0–0.4)
Eos: 2 %
Hematocrit: 43.9 % (ref 34.0–46.6)
Hemoglobin: 14.2 g/dL (ref 11.1–15.9)
Immature Grans (Abs): 0 10*3/uL (ref 0.0–0.1)
Immature Granulocytes: 0 %
Lymphocytes Absolute: 4.7 10*3/uL — ABNORMAL HIGH (ref 0.7–3.1)
Lymphs: 50 %
MCH: 28.6 pg (ref 26.6–33.0)
MCHC: 32.3 g/dL (ref 31.5–35.7)
MCV: 89 fL (ref 79–97)
Monocytes Absolute: 0.6 10*3/uL (ref 0.1–0.9)
Monocytes: 7 %
Neutrophils Absolute: 3.7 10*3/uL (ref 1.4–7.0)
Neutrophils: 40 %
Platelets: 301 10*3/uL (ref 150–450)
RBC: 4.96 x10E6/uL (ref 3.77–5.28)
RDW: 13.3 % (ref 11.7–15.4)
WBC: 9.3 10*3/uL (ref 3.4–10.8)

## 2023-03-21 LAB — COMPREHENSIVE METABOLIC PANEL
ALT: 29 IU/L (ref 0–32)
AST: 23 IU/L (ref 0–40)
Albumin/Globulin Ratio: 1.3 (ref 1.2–2.2)
Albumin: 3.9 g/dL (ref 3.8–4.9)
Alkaline Phosphatase: 89 IU/L (ref 44–121)
BUN/Creatinine Ratio: 14 (ref 12–28)
BUN: 11 mg/dL (ref 8–27)
Bilirubin Total: 0.5 mg/dL (ref 0.0–1.2)
CO2: 24 mmol/L (ref 20–29)
Calcium: 9.1 mg/dL (ref 8.7–10.3)
Chloride: 106 mmol/L (ref 96–106)
Creatinine, Ser: 0.77 mg/dL (ref 0.57–1.00)
Globulin, Total: 2.9 g/dL (ref 1.5–4.5)
Glucose: 98 mg/dL (ref 70–99)
Potassium: 4.1 mmol/L (ref 3.5–5.2)
Sodium: 142 mmol/L (ref 134–144)
Total Protein: 6.8 g/dL (ref 6.0–8.5)
eGFR: 88 mL/min/{1.73_m2} (ref >59)

## 2023-03-22 ENCOUNTER — Other Ambulatory Visit: Payer: Self-pay | Admitting: Orthopedic Surgery

## 2023-03-22 MED ORDER — PREGABALIN 50 MG PO CAPS
50.0000 mg | ORAL_CAPSULE | Freq: Three times a day (TID) | ORAL | 0 refills | Status: AC
  Filled 2023-03-22: qty 90, 30d supply, fill #0

## 2023-03-24 ENCOUNTER — Other Ambulatory Visit: Payer: Self-pay

## 2023-03-24 ENCOUNTER — Other Ambulatory Visit (HOSPITAL_COMMUNITY): Payer: Self-pay

## 2023-03-31 ENCOUNTER — Telehealth: Payer: Commercial Managed Care - PPO | Admitting: Family Medicine

## 2023-03-31 ENCOUNTER — Other Ambulatory Visit (HOSPITAL_COMMUNITY): Payer: Self-pay

## 2023-03-31 ENCOUNTER — Encounter: Payer: Self-pay | Admitting: Physical Medicine & Rehabilitation

## 2023-03-31 ENCOUNTER — Encounter
Payer: Commercial Managed Care - PPO | Attending: Physical Medicine & Rehabilitation | Admitting: Physical Medicine & Rehabilitation

## 2023-03-31 ENCOUNTER — Other Ambulatory Visit: Payer: Self-pay

## 2023-03-31 VITALS — BP 112/70 | HR 74 | Ht 60.0 in | Wt 157.0 lb

## 2023-03-31 DIAGNOSIS — Z7901 Long term (current) use of anticoagulants: Secondary | ICD-10-CM | POA: Diagnosis not present

## 2023-03-31 DIAGNOSIS — G894 Chronic pain syndrome: Secondary | ICD-10-CM | POA: Insufficient documentation

## 2023-03-31 DIAGNOSIS — Z5181 Encounter for therapeutic drug level monitoring: Secondary | ICD-10-CM | POA: Insufficient documentation

## 2023-03-31 DIAGNOSIS — M545 Low back pain, unspecified: Secondary | ICD-10-CM | POA: Diagnosis not present

## 2023-03-31 DIAGNOSIS — Z79899 Other long term (current) drug therapy: Secondary | ICD-10-CM | POA: Diagnosis not present

## 2023-03-31 DIAGNOSIS — G8929 Other chronic pain: Secondary | ICD-10-CM | POA: Diagnosis not present

## 2023-03-31 DIAGNOSIS — R339 Retention of urine, unspecified: Secondary | ICD-10-CM

## 2023-03-31 MED ORDER — GABAPENTIN 300 MG PO CAPS
600.0000 mg | ORAL_CAPSULE | Freq: Every day | ORAL | 4 refills | Status: AC
  Filled 2023-03-31 – 2023-04-25 (×6): qty 180, 90d supply, fill #0
  Filled 2023-07-23: qty 180, 90d supply, fill #1
  Filled 2023-09-28 – 2023-10-03 (×2): qty 180, 90d supply, fill #2

## 2023-03-31 NOTE — Progress Notes (Signed)
[place]   Urine retention needs in person eval

## 2023-03-31 NOTE — Progress Notes (Addendum)
Subjective:    Patient ID: Cristina [name], female    DOB: 12/17/1960, 62 y.o.   MRN: [id number]  HPI  HPI  Cristina [name] is a 62 y.o. year old female  who  has a past medical history of Allergic rhinitis, cause unspecified, Anemia, Arthritis, Asthma, Back pain, Chest pain, Constipation, CTS (carpal tunnel syndrome), Cystocele, Depression, Diabetes mellitus, Dysfunction of eustachian tube, Essential hypertension (02/12/2017), Family history of adverse reaction to anesthesia, Fatty liver, Fibromyalgia, Fissure, anal, Gallbladder problem, Genital herpes, GERD (gastroesophageal reflux disease), Headache, Hemorrhoid, migraine headaches, Hyperlipidemia, IBS (irritable bowel syndrome), Insomnia, Iron deficiency anemia, unspecified, Joint pain, Nausea, Obesity, OSA on CPAP, Osteoarthritis, Rectocele, Sleep apnea, and TMJ (dislocation of temporomandibular joint).   They are presenting to PM&R clinic as a new patient for pain management evaluation. They were referred by Dr. Christell Constant for treatment of lower back pain pain.  Back pain is worsened by most activities such as standing, walking and sitting.  She often has this change positions due to the pain.  She also reports hip pain when she is walking for long period time.  Previously she would have right thigh numbness that would occur when she was walking.  Lying down will help the pain.  Ice and heat also makes the pain better.  Bending forward is worse than bending backwards.  She was previously followed by Dr. Otelia Sergeant and now followed by Dr. Christell Constant. She had history of left shoulder surgery.  Reports history of carpal tunnel syndrome and her right wrist with prior carpal tunnel surgery on her left wrist.   Red flag symptoms: No red flags for back pain endorsed in Hx or ROS  Medications tried: Topical medications  voltaren- helps slightly  Nsaids- cannot take due to gastric bypass Tylenol  -helps a little Opiates  Tramadol- helped for other pain in the  past Hydrocodone - helped for other pain in the past Oxycodone- helped for other pain in past Gabapentin - helps slightly Lyrica- about same benefit as gabapentin  SNRIs - tried it for back,didn't help much  Muscle relaxers   Other treatments:       PT/OT - didn't provide much benefit other has not tried in a while Chiropractor- helped a little TENs unit - helps  Injections- ESI didn't help, facet joint injections didn't help Surgery - no surgery Heating pads helps   Prior UDS results: No results found for: "LABOPIA", "COCAINSCRNUR", "LABBENZ", "AMPHETMU", "THCU", "LABBARB"     Pain Inventory Average Pain 6 Pain Right Now 6 My pain is dull  In the last 24 hours, has pain interfered with the following? General activity 2 Relation with others 2 Enjoyment of life 3 What TIME of day is your pain at its worst? varies Sleep (in general) Fair  Pain is worse with: walking, bending, sitting, and standing Pain improves with: rest and heat/ice Relief from Meds: 5  walk without assistance how many minutes can you walk? 10 ability to climb steps?  yes do you drive?  yes transfers alone Do you have any goals in this area?  no  employed # of hrs/week CMA Do you have any goals in this area?  no  depression  Any changes since last visit?  no  Any changes since last visit?  no    Family History  Problem Relation Age of Onset   Hypertension Mother 70   Osteoarthritis Mother    Diabetes Mother    Colon cancer Mother 44   Cancer  Mother 78       colon cancer   Hyperlipidemia Mother    Obesity Mother    Sudden death Mother    Arthritis Mother    Colon polyps Brother        x 2 brother   Cirrhosis Brother        alcoholism   Alcohol abuse Brother    Sleep apnea Brother    Hyperlipidemia Brother    Migraines Brother    Obstructive Sleep Apnea Brother    Colon cancer Paternal Aunt    Stroke Maternal Grandmother    Colon cancer Paternal Grandmother    Cancer  Paternal Grandmother        colon cancer   Diabetes Daughter    Hypertension Daughter    Sleep apnea Daughter    Mental illness Daughter        depression   Migraines Daughter    Migraines Daughter    Mental illness Daughter        anxiety attacks   Protein S deficiency Daughter    Esophageal cancer Neg Hx    Stomach cancer Neg Hx    Pancreatic cancer Neg Hx    Social History   Socioeconomic History   Marital status: Single    Spouse name: n/a   Number of children: 3   Years of education: college   Highest education level: Not on file  Occupational History   Occupation: Electrical engineer: [employer]    Comment: cma/[employer]  Tobacco Use   Smoking status: Former    Types: Cigarettes   Smokeless tobacco: Never  Vaping Use   Vaping Use: Never used  Substance and Sexual Activity   Alcohol use: No    Alcohol/week: 1.0 standard drink of alcohol    Types: 1 Glasses of wine per week   Drug use: No   Sexual activity: Not Currently    Birth control/protection: None  Other Topics Concern   Not on file  Social History Narrative   Marital status:  Divorced in 2000 after six years of marriage; not dating in 2017.      Children:  3 daughters (38, 20, 45); 6 grandchildren.      Lives: alone.  2 dogs; grandson stays some.      Employment:  Working at [employer]. CMA chart prep team.      Tobacco: none      Alcohol: socially; special occasions.  Rare.      Drugs: none      Exercise:  none      Sexual activity:  Not sexually active since 2011.       Guns:  No guns in the home.       Smoke detectors in use,       Seatbelt:  Sometimes uses seat belts. 75% of time.  No texting while driving.     Social Determinants of Health   Financial Resource Strain: Not on file  Food Insecurity: Not on file  Transportation Needs: Not on file  Physical Activity: Not on file  Stress: Not on file  Social Connections: Not on file   Past Surgical  History:  Procedure Laterality Date   ABDOMINAL HYSTERECTOMY  11/11/2010   Senaida Ores.  Ovaries intact.  Uterine fibroids with DUB.   BICEPT TENODESIS Left 10/29/2022   Procedure: BICEPS TENODESIS;  Surgeon: Cammy Copa, MD;  Location: [facility] OR;  Service: Orthopedics;  Laterality: Left;  CARPAL TUNNEL RELEASE  11/12/1987   Left   CHOLECYSTECTOMY     CHOLECYSTECTOMY     COLONOSCOPY  06/29/2012   normal.  Repeat in 5 years.   DILATION AND CURETTAGE OF UTERUS     ECTOPIC PREGNANCY SURGERY  11/12/1991   ESOPHAGOGASTRODUODENOSCOPY  12/13/2011   dysphagia.  Marina Goodell.  Normal.   GASTRIC ROUX-EN-Y N/A 07/06/2018   Procedure: LAPAROSCOPIC ROUX-EN-Y GASTRIC BYPASS WITH UPPER ENDOSCOPY;  Surgeon: Glenna Fellows, MD;  Location: [facility] ORS;  Service: General;  Laterality: N/A;   SHOULDER ARTHROSCOPY WITH OPEN ROTATOR CUFF REPAIR AND DISTAL CLAVICLE ACROMINECTOMY Left 10/29/2022   Procedure: LEFT SHOULDER ACROMIOCLAVICULAR RESECTION, DEBRIDEMENT;  Surgeon: Cammy Copa, MD;  Location: [facility] OR;  Service: Orthopedics;  Laterality: Left;   Sleep study  09/11/2012   severe OSA.  CPAP titration at 12 cm water pressure.   TOTAL KNEE ARTHROPLASTY Left 05/31/2021   Procedure: LEFT TOTAL KNEE ARTHROPLASTY;  Surgeon: Kathryne Hitch, MD;  Location: [facility] OR;  Service: Orthopedics;  Laterality: Left;   TUBAL LIGATION  11/11/1986   Past Medical History:  Diagnosis Date   Allergic rhinitis, cause unspecified    Anemia    Arthritis    Asthma    Back pain    Chest pain    Constipation    CTS (carpal tunnel syndrome)    Cystocele    Depression    Diabetes mellitus    Dysfunction of eustachian tube    Essential hypertension 02/12/2017   Family history of adverse reaction to anesthesia    daughter hard to wake up   Fatty liver    Fibromyalgia    Fissure, anal    Gallbladder problem    Genital herpes    GERD (gastroesophageal reflux disease)    Headache    Hemorrhoid    Hx of migraine  headaches    Hyperlipidemia    IBS (irritable bowel syndrome)    Insomnia    Iron deficiency anemia, unspecified    Joint pain    Nausea    Obesity    OSA on CPAP    Osteoarthritis    Rectocele    Sleep apnea    lost weight, does not use CPAP   TMJ (dislocation of temporomandibular joint)    Pulse 74   Ht 5' (1.524 m)   Wt 157 lb (71.2 kg)   LMP 11/11/2010   SpO2 96%   BMI 30.66 kg/m   Opioid Risk Score:   Fall Risk Score:  `1  Depression screen PHQ 2/9     03/31/2023    3:01 PM 04/01/2018   10:07 AM 02/06/2018    1:34 PM 12/31/2017    8:10 AM 12/22/2017   11:50 AM 10/13/2017    3:52 PM 10/13/2017    3:51 PM  Depression screen PHQ 2/9  Decreased Interest 0 0 0 1 0 0 0  Down, Depressed, Hopeless 0 0 0 1 0 1 0  PHQ - 2 Score 0 0 0 2 0 1 0  Altered sleeping  0  1     Tired, decreased energy  0  0     Change in appetite  0       Feeling bad or failure about yourself   0  0     Trouble concentrating  0  0     Moving slowly or fidgety/restless  0  0     Suicidal thoughts  0  0     PHQ-9  Score  0  3         Review of Systems  Musculoskeletal:  Positive for back pain.  All other systems reviewed and are negative.     Objective:   Physical Exam   Gen: no distress, normal appearing HEENT: oral mucosa pink and moist, NCAT Cardio: Reg rate Chest: normal effort, normal rate of breathing Abd: soft, non-distended Ext: no edema Psych: pleasant, normal affect Skin: intact Neuro: alert and awake, follows commands, CN 2-12 grossly intact Strength 5/5 in all 4 extremities Sensation intact LT in all 4 extremities No ataxia or dysmetria DTR normal and symmetric  No ankle clonus Musculoskeletal:  L spine tenderness left > right SLR negative  Spurlings negative  Facet loading negative  Neg SI joint compression FABER negative bilaterally  L spine xray 02/29/16 FINDINGS: Normal signal is present in the conus medullaris which terminates at T12-L1, within normal  limits. Chronic fatty infiltration is present at the anterior endplates of L1-2. Hemangiomas are present on the right at L2 and L4. Grade 1 anterolisthesis at L4-5 measures 7 mm.   There straightening of the normal lumbar lordosis. Limited imaging of the abdomen is unremarkable.   Foraminal narrowing bilaterally T10-11 and T11-12 is secondary to facet disease.   T12-L1:  Negative.   L1-2: A mild broad-based disc protrusion partially effaces the ventral CSF. There is no focal stenosis.   L2-3: No significant disc protrusion is present. Mild facet hypertrophy is evident. There is no significant stenosis.   L3-4: No significant focal disc protrusion is present. Moderate facet hypertrophy is evident bilaterally without significant focal stenosis.   L4-5: Moderate facet hypertrophy is noted bilaterally. There is uncovering of a broad-based disc protrusion. The disc protrusion is asymmetric to the left. This results in moderate left subarticular and foraminal narrowing. Mild right subarticular and foraminal narrowing is present.   L5-S1: A shallow central disc protrusion is present. Mild facet hypertrophy is noted bilaterally. There is no significant stenosis.   IMPRESSION: 1. 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 2. Mild right subarticular and foraminal stenosis at L4-5. 3. Chronic endplate changes anteriorly at L1-2 with a mild broad-based disc protrusion which partially effaces the ventral CSF but does not create any significant focal stenosis. 4. Multilevel facet hypertrophy throughout the lumbar spine, most prominent at L4-5.    Imaging From Dr. Buel Ream note 03/31/23 XR of the lumbar spine from 11/21/2022 was previously independently reviewed and interpreted, showing spondylolisthesis at L4-5 that shifts about 1 mm  between flexion and extension views.  No fracture or dislocation seen.  Lordotic alignment.  Disc height loss at L4/5.        Assessment & Plan:   Chronic left sided lower back pain -PT consult placed -Poor results with multiple non-opiod medications in the past -Poor results with ESI and Facet injections  in past -Follows with Dr. Christell Constant -UDS and pain agreement today -Consider tramadol if pain worsens or does not improve with therapy, pt to call and we will discuss    Addendum- pt completed UDS greater then 24hrs after visit. She reports she went to lab at end of the day and computer was not working at the time. She did not realize it had to be in this time frame. Will need to repeat random UDS.

## 2023-04-01 ENCOUNTER — Other Ambulatory Visit (HOSPITAL_COMMUNITY): Payer: Self-pay

## 2023-04-01 ENCOUNTER — Emergency Department (HOSPITAL_BASED_OUTPATIENT_CLINIC_OR_DEPARTMENT_OTHER): Payer: Commercial Managed Care - PPO

## 2023-04-01 ENCOUNTER — Encounter (HOSPITAL_BASED_OUTPATIENT_CLINIC_OR_DEPARTMENT_OTHER): Payer: Self-pay

## 2023-04-01 ENCOUNTER — Emergency Department (HOSPITAL_BASED_OUTPATIENT_CLINIC_OR_DEPARTMENT_OTHER)
Admission: EM | Admit: 2023-04-01 | Discharge: 2023-04-01 | Disposition: A | Discharge status: Home / Self Care | Payer: Commercial Managed Care - PPO | Attending: Emergency Medicine | Admitting: Emergency Medicine

## 2023-04-01 ENCOUNTER — Other Ambulatory Visit (HOSPITAL_BASED_OUTPATIENT_CLINIC_OR_DEPARTMENT_OTHER): Payer: Self-pay

## 2023-04-01 ENCOUNTER — Other Ambulatory Visit: Payer: Self-pay

## 2023-04-01 DIAGNOSIS — N390 Urinary tract infection, site not specified: Secondary | ICD-10-CM | POA: Insufficient documentation

## 2023-04-01 DIAGNOSIS — I10 Essential (primary) hypertension: Secondary | ICD-10-CM | POA: Diagnosis not present

## 2023-04-01 DIAGNOSIS — R109 Unspecified abdominal pain: Secondary | ICD-10-CM | POA: Diagnosis not present

## 2023-04-01 DIAGNOSIS — I700 Atherosclerosis of aorta: Secondary | ICD-10-CM | POA: Diagnosis not present

## 2023-04-01 DIAGNOSIS — E119 Type 2 diabetes mellitus without complications: Secondary | ICD-10-CM | POA: Diagnosis not present

## 2023-04-01 DIAGNOSIS — R1032 Left lower quadrant pain: Secondary | ICD-10-CM | POA: Diagnosis not present

## 2023-04-01 LAB — COMPREHENSIVE METABOLIC PANEL
ALT: 22 U/L (ref 0–44)
AST: 17 U/L (ref 15–41)
Albumin: 3.8 g/dL (ref 3.5–5.0)
Alkaline Phosphatase: 60 U/L (ref 38–126)
Anion gap: 6 (ref 5–15)
BUN: 12 mg/dL (ref 6–20)
CO2: 28 mmol/L (ref 22–32)
Calcium: 8.6 mg/dL — ABNORMAL LOW (ref 8.9–10.3)
Chloride: 108 mmol/L (ref 98–111)
Creatinine, Ser: 0.7 mg/dL (ref 0.44–1.00)
GFR, Estimated: >60 mL/min (ref >60)
Glucose, Bld: 95 mg/dL (ref 70–99)
Potassium: 4 mmol/L (ref 3.5–5.1)
Sodium: 142 mmol/L (ref 135–145)
Total Bilirubin: 0.5 mg/dL (ref 0.3–1.2)
Total Protein: 6.4 g/dL — ABNORMAL LOW (ref 6.5–8.1)

## 2023-04-01 LAB — CBC WITH DIFFERENTIAL/PLATELET
Abs Immature Granulocytes: 0.01 10*3/uL (ref 0.00–0.07)
Basophils Absolute: 0 10*3/uL (ref 0.0–0.1)
Basophils Relative: 1 %
Eosinophils Absolute: 0.1 10*3/uL (ref 0.0–0.5)
Eosinophils Relative: 2 %
HCT: 43.6 % (ref 36.0–46.0)
Hemoglobin: 14 g/dL (ref 12.0–15.0)
Immature Granulocytes: 0 %
Lymphocytes Relative: 54 %
Lymphs Abs: 3.3 10*3/uL (ref 0.7–4.0)
MCH: 28.9 pg (ref 26.0–34.0)
MCHC: 32.1 g/dL (ref 30.0–36.0)
MCV: 90.1 fL (ref 80.0–100.0)
Monocytes Absolute: 0.4 10*3/uL (ref 0.1–1.0)
Monocytes Relative: 6 %
Neutro Abs: 2.2 10*3/uL (ref 1.7–7.7)
Neutrophils Relative %: 37 %
Platelets: 269 10*3/uL (ref 150–400)
RBC: 4.84 MIL/uL (ref 3.87–5.11)
RDW: 13.5 % (ref 11.5–15.5)
WBC: 6.1 10*3/uL (ref 4.0–10.5)
nRBC: 0 % (ref 0.0–0.2)

## 2023-04-01 LAB — URINALYSIS, ROUTINE W REFLEX MICROSCOPIC
Bilirubin Urine: NEGATIVE
Glucose, UA: NEGATIVE mg/dL
Hgb urine dipstick: NEGATIVE
Ketones, ur: NEGATIVE mg/dL
Nitrite: NEGATIVE
Protein, ur: NEGATIVE mg/dL
Specific Gravity, Urine: 1.022 (ref 1.005–1.030)
pH: 5.5 (ref 5.0–8.0)

## 2023-04-01 MED ORDER — IOHEXOL 300 MG/ML  SOLN
100.0000 mL | Freq: Once | INTRAMUSCULAR | Status: AC | PRN
  Administered 2023-04-01: 75 mL via INTRAVENOUS

## 2023-04-01 MED ORDER — KETOROLAC TROMETHAMINE 30 MG/ML IJ SOLN
30.0000 mg | Freq: Once | INTRAMUSCULAR | Status: AC
  Administered 2023-04-01: 30 mg via INTRAVENOUS
  Filled 2023-04-01: qty 1

## 2023-04-01 MED ORDER — CEPHALEXIN 500 MG PO CAPS
500.0000 mg | ORAL_CAPSULE | Freq: Three times a day (TID) | ORAL | 0 refills | Status: AC
  Filled 2023-04-01: qty 21, 7d supply, fill #0

## 2023-04-01 MED ORDER — ACYCLOVIR 400 MG PO TABS
400.0000 mg | ORAL_TABLET | Freq: Two times a day (BID) | ORAL | 4 refills | Status: AC
  Filled 2023-04-01: qty 180, 90d supply, fill #0
  Filled 2023-07-08: qty 180, 90d supply, fill #1
  Filled 2023-10-13: qty 180, 90d supply, fill #2
  Filled 2024-01-06: qty 180, 90d supply, fill #3

## 2023-04-01 NOTE — ED Provider Notes (Signed)
[place] EMERGENCY DEPARTMENT AT [facility] Provider Note   CSN: [id number] Arrival date & time: 04/01/23  0840     History {Add pertinent medical, surgical, social history, OB history to HPI:1} Chief Complaint  Patient presents with   Groin Pain    Cristina [name] is a 62 y.o. female.  HPI     62 year old female with a history of diabetes, hypertension, hyperlipidemia, OSA, chronic back pain, who presents with concern for left groin pain.  Reports she has had symptoms over the last 2 weeks that were initially waxing and waning and have become more severe today.  Reports that the pain in the left lower quadrant/left groin, and radiates to the top of her leg.  Does not radiate more distally, no numbness, weakness, loss of control or bowel or bladder.  No falls, trauma.  No fever.  Has a history of IBS with alternating diarrhea and constipation and denies any acute changes.  Nausea no vomiting. She has not had a bowel movement today.  Denies dysuria, vaginal discharge or bleeding.  Has not had pain like this before.  It is worse with ambulation.  History of abdominal surgery   Past Medical History:  Diagnosis Date   Allergic rhinitis, cause unspecified    Anemia    Arthritis    Asthma    Back pain    Chest pain    Constipation    CTS (carpal tunnel syndrome)    Cystocele    Depression    Diabetes mellitus    Dysfunction of eustachian tube    Essential hypertension 02/12/2017   Family history of adverse reaction to anesthesia    daughter hard to wake up   Fatty liver    Fibromyalgia    Fissure, anal    Gallbladder problem    Genital herpes    GERD (gastroesophageal reflux disease)    Headache    Hemorrhoid    Hx of migraine headaches    Hyperlipidemia    IBS (irritable bowel syndrome)    Insomnia    Iron deficiency anemia, unspecified    Joint pain    Nausea    Obesity    OSA on CPAP    Osteoarthritis    Rectocele    Sleep apnea    lost weight,  does not use CPAP   TMJ (dislocation of temporomandibular joint)     Home Medications Prior to Admission medications   Medication Sig Start Date End Date Taking? Authorizing Provider  acyclovir (ZOVIRAX) 400 MG tablet Take 1 tablet (400 mg total) by mouth 2 (two) times daily. 03/05/22     albuterol (VENTOLIN HFA) 108 (90 Base) MCG/ACT inhaler Inhale 2 puffs into the lungs every 6 (six) hours as needed for wheezing 03/05/22     ANUCORT-HC 25 MG suppository PLACE 1 SUPPOSITORY RECTALLY 2 TIMES DAILY AS NEEDED FOR HEMORRHOIDS. 05/21/17   Ethelda Chick, MD  azelastine (ASTELIN) 0.1 % nasal spray PLACE 2 SPRAYS INTO BOTH NOSTRILS 2 TIMES DAILY AS DIRECTED Patient taking differently: Place 2 sprays into both nostrils daily as needed for allergies or rhinitis. 05/21/17   Ethelda Chick, MD  azelastine (OPTIVAR) 0.05 % ophthalmic solution Place 1 drop into both eyes 2 (two) times daily as needed. 03/05/22     Blood Glucose Monitoring Suppl (FREESTYLE LITE) w/Device KIT use to test blood sugar once daily 05/03/22     buPROPion (WELLBUTRIN) 100 MG tablet Take 1 tablet (100 mg total) by mouth 2 (  two) times daily 03/05/22     buPROPion (WELLBUTRIN) 100 MG tablet Take 1 tablet (100 mg total) by mouth 2 (two) times daily 08/29/22     calcium carbonate (OS-CAL) 1250 (500 Ca) MG chewable tablet Chew 1 tablet by mouth daily.    [provider]  cetirizine (ZYRTEC) 10 MG tablet Take 1 tablet (10 mg total) by mouth once daily 02/27/21     cetirizine (ZYRTEC) 10 MG tablet Take 1 tablet (10 mg total) by mouth once daily 08/29/22     diltiazem 2 % GEL Apply 1 application topically 3 (three) times daily. Patient taking differently: Apply 1 application  topically 3 (three) times daily as needed (anal fissure). 04/01/17   Zehr, Princella Pellegrini, PA-C  empagliflozin (JARDIANCE) 25 MG TABS tablet Take 1 tablet (25 mg total) by mouth daily with breakfast. 03/05/22     empagliflozin (JARDIANCE) 25 MG TABS tablet Take 1 tablet  (25 mg total) by mouth daily with breakfast. 08/29/22     escitalopram (LEXAPRO) 10 MG tablet TAKE 1 TABLET BY MOUTH ONCE DAILY 11/17/20   Ethelda Chick, MD  escitalopram (LEXAPRO) 10 MG tablet Take 1 tablet (10 mg total) by mouth daily. 01/23/23     esomeprazole (NEXIUM) 40 MG capsule Take 1 capsule (40 mg total) by mouth daily. 09/27/21     esomeprazole (NEXIUM) 40 MG capsule Take 1 capsule (40 mg total) by mouth daily. 10/25/22     Evolocumab (REPATHA SURECLICK) 140 MG/ML SOAJ Inject 1 mL into the skin every 14 (fourteen) days. 08/14/22   Meriam Sprague, MD  fluticasone (FLONASE) 50 MCG/ACT nasal spray Place 2 sprays into both nostrils daily. 08/29/22     gabapentin (NEURONTIN) 300 MG capsule Take 2 capsules (600 mg total) by mouth at bedtime. 03/31/23     glucose blood (FREESTYLE LITE) test strip use 1 strip to test blood sugar once daily 05/03/22     glucose blood test strip Use as directed 05/01/22   Meriam Sprague, MD  Lancets (FREESTYLE) lancets use 1 lancet to test blood sugar once daily 05/03/22     methocarbamol (ROBAXIN) 500 MG tablet Take 1 tablet (500 mg total) by mouth every 8 (eight) hours as needed for muscle spasms. 10/29/22   Magnant, Joycie Peek, PA-C  Multiple Vitamins-Minerals (MULTIVITAMIN WITH MINERALS) tablet Take 1 tablet by mouth daily.    [provider]  nitroGLYCERIN (NITROSTAT) 0.4 MG SL tablet Place 1 tablet (0.4 mg total) under the tongue every 5 (five) minutes as needed for Chest pain Maximum of 3 doses in 24 hour period. 08/29/22     pregabalin (LYRICA) 50 MG capsule Take 1 capsule (50 mg total) by mouth 3 (three) times daily. 03/22/23   London Sheer, MD  tirzepatide [facility]) 10 MG/0.5ML Pen Inject 10 mg into the skin once a week. 03/17/23   Meriam Sprague, MD  traMADol (ULTRAM) 50 MG tablet Take 1 tablet (50 mg total) by mouth every 8 (eight) hours as needed. 12/11/22   Magnant, Joycie Peek, PA-C  TRUEPLUS LANCETS 30G MISC USE TO CHECK BLOOD  GLUCOSE DAILY AS DIRECTED 05/21/17   Ethelda Chick, MD      Allergies    Atorvastatin and Fish oil    Review of Systems   Review of Systems  Physical Exam Updated Vital Signs BP 132/78 (BP Location: Right Arm)   Pulse 64   Temp 98.3 F (36.8 C) (Oral)   Resp 15   Ht 5'  1" (1.549 m)   Wt 68 kg   LMP 11/11/2010   SpO2 99%   BMI 28.34 kg/m  Physical Exam  ED Results / Procedures / Treatments   Labs (all labs ordered are listed, but only abnormal results are displayed) Labs Reviewed - No data to display  EKG None  Radiology No results found.  Procedures Procedures  {Document cardiac monitor, telemetry assessment procedure when appropriate:1}  Medications Ordered in ED Medications - No data to display  ED Course/ Medical Decision Making/ A&P   {   Click here for ABCD2, HEART and other calculatorsREFRESH Note before signing :1}                           62 year old female with a history of diabetes, hypertension, hyperlipidemia, OSA, chronic back pain, who presents with concern for left groin pain.  DDx includes UTI, nephrolithiasis, diverticulitis, hernia, msk pain, radicular pain from back.  Labs completed and personally evaluated interpreted by me show urinalysis with sign of infection. Leukocytes present  CBC and CMP without clinically significant abnormalities.  CT abdomen pelvis ordered to evaluate for diverticulitis or hernia in the setting of left lower quadrant abdominal tenderness.  CT personally about interpreted by me and radiology shows scattered colonic stool, no bowel obstruction, no sign of diverticulitis.  Pain may be secondary to cystitis, consider other musculoskeletal etiology as well with history of degenerative disc disease.  Given rx for keflex.  {Document critical care time when appropriate:1} {Document review of labs and clinical decision tools ie heart score, Chads2Vasc2 etc:1}  {Document your independent review of radiology images,  and any outside records:1} {Document your discussion with family members, caretakers, and with consultants:1} {Document social determinants of health affecting pt's care:1} {Document your decision making why or why not admission, treatments were needed:1} Final Clinical Impression(s) / ED Diagnoses Final diagnoses:  None    Rx / DC Orders ED Discharge Orders     None

## 2023-04-01 NOTE — ED Triage Notes (Signed)
In for eval of pain in left groin radiating down into left leg. Onset 2 weeks ago intermittent, pain started again yesterday and has not stopped. Painful to walk. Denies numbness or tingling in left foot.

## 2023-04-01 NOTE — ED Notes (Signed)
Discharge instructions, follow up care, and prescription reviewed and explained, pt verbalized understanding.  

## 2023-04-03 ENCOUNTER — Other Ambulatory Visit (HOSPITAL_COMMUNITY): Payer: Self-pay

## 2023-04-03 DIAGNOSIS — Z79899 Other long term (current) drug therapy: Secondary | ICD-10-CM | POA: Diagnosis not present

## 2023-04-03 DIAGNOSIS — Z5181 Encounter for therapeutic drug level monitoring: Secondary | ICD-10-CM | POA: Diagnosis not present

## 2023-04-03 DIAGNOSIS — G894 Chronic pain syndrome: Secondary | ICD-10-CM | POA: Diagnosis not present

## 2023-04-04 ENCOUNTER — Telehealth: Payer: Self-pay | Admitting: *Deleted

## 2023-04-04 ENCOUNTER — Other Ambulatory Visit (HOSPITAL_COMMUNITY): Payer: Self-pay

## 2023-04-04 MED ORDER — FLUCONAZOLE 150 MG PO TABS
150.0000 mg | ORAL_TABLET | Freq: Once | ORAL | 0 refills | Status: AC
  Filled 2023-04-04: qty 1, 1d supply, fill #0

## 2023-04-04 NOTE — Telephone Encounter (Signed)
Verbal order given by Dr. Shari Prows to call in one time dose of diflucan 150 mg tablet for the pt.  Pt is currently dealing with a UTI and taking oral antibiotics.  Confirmed the pharmacy of choice with the pt.  Pt verbalized understanding and agrees with this plan.

## 2023-04-07 ENCOUNTER — Other Ambulatory Visit: Payer: Self-pay

## 2023-04-08 ENCOUNTER — Other Ambulatory Visit (HOSPITAL_COMMUNITY): Payer: Self-pay

## 2023-04-08 ENCOUNTER — Telehealth: Payer: Self-pay | Admitting: *Deleted

## 2023-04-08 ENCOUNTER — Other Ambulatory Visit: Payer: Self-pay

## 2023-04-08 ENCOUNTER — Other Ambulatory Visit: Payer: Self-pay | Admitting: Pharmacist

## 2023-04-08 DIAGNOSIS — E119 Type 2 diabetes mellitus without complications: Secondary | ICD-10-CM

## 2023-04-08 MED ORDER — TERCONAZOLE 0.4 % VA CREA
1.0000 | TOPICAL_CREAM | Freq: Every day | VAGINAL | 3 refills | Status: AC
  Filled 2023-04-08: qty 45, 7d supply, fill #0
  Filled 2023-06-09: qty 45, 7d supply, fill #1
  Filled 2023-08-24: qty 45, 7d supply, fill #2

## 2023-04-08 MED ORDER — MOUNJARO 10 MG/0.5ML ~~LOC~~ SOAJ
10.0000 mg | SUBCUTANEOUS | 0 refills | Status: DC
Start: 2023-04-08 — End: 2023-04-14
  Filled 2023-04-08 (×2): qty 2, 28d supply, fill #0

## 2023-04-08 NOTE — Telephone Encounter (Signed)
Terconazole 0.4% vaginal cream sent in for the pt, as verbally ordered by Dr. Shari Prows.  Pt aware of this.  Confirmed the pharmacy of choice with the pt.  Pt verbalized understanding and agrees with this plan.

## 2023-04-09 ENCOUNTER — Other Ambulatory Visit (HOSPITAL_COMMUNITY): Payer: Self-pay

## 2023-04-09 LAB — TOXASSURE SELECT,+ANTIDEPR,UR

## 2023-04-10 ENCOUNTER — Other Ambulatory Visit (HOSPITAL_COMMUNITY): Payer: Self-pay

## 2023-04-11 ENCOUNTER — Other Ambulatory Visit (HOSPITAL_COMMUNITY): Payer: Self-pay

## 2023-04-14 ENCOUNTER — Other Ambulatory Visit (HOSPITAL_COMMUNITY): Payer: Self-pay

## 2023-04-14 ENCOUNTER — Telehealth: Payer: Self-pay | Admitting: Pharmacist

## 2023-04-14 MED ORDER — MOUNJARO 12.5 MG/0.5ML ~~LOC~~ SOAJ
12.5000 mg | SUBCUTANEOUS | 0 refills | Status: DC
  Filled 2023-04-14 (×2): qty 2, 28d supply, fill #0

## 2023-04-14 NOTE — Telephone Encounter (Signed)
Pt requesting to increase Mounjaro dose to 12.5mg . tolerated 10mg  well, reports pharmacy can't get refills of 10mg  dose either. Rx sent in for 12.5mg .

## 2023-04-17 ENCOUNTER — Ambulatory Visit: Payer: Commercial Managed Care - PPO

## 2023-04-17 ENCOUNTER — Telehealth: Payer: Self-pay | Admitting: *Deleted

## 2023-04-17 NOTE — Telephone Encounter (Signed)
THIS URINE DRUG SCREEN WAS NOT DONE AT THE TIME OF THE ORDER. The urine ws not collected until 04/02/22 which is out of required 24 hours after order issued. Urine was consistent with prescribed medication.

## 2023-04-21 ENCOUNTER — Other Ambulatory Visit: Payer: Self-pay

## 2023-04-21 ENCOUNTER — Other Ambulatory Visit (HOSPITAL_COMMUNITY): Payer: Self-pay

## 2023-04-22 ENCOUNTER — Other Ambulatory Visit (HOSPITAL_COMMUNITY): Payer: Self-pay

## 2023-04-23 NOTE — Telephone Encounter (Addendum)
Cristina [name] wanted to know if she will be able to get her pain medication?   She was to return the next day for the UDS collection.The lab computer was down around 4:45 PM on DOS.

## 2023-04-25 ENCOUNTER — Other Ambulatory Visit (HOSPITAL_COMMUNITY): Payer: Self-pay

## 2023-05-07 ENCOUNTER — Ambulatory Visit: Payer: Commercial Managed Care - PPO

## 2023-05-12 ENCOUNTER — Other Ambulatory Visit (HOSPITAL_COMMUNITY): Payer: Self-pay

## 2023-05-13 ENCOUNTER — Other Ambulatory Visit (HOSPITAL_COMMUNITY): Payer: Self-pay

## 2023-05-13 ENCOUNTER — Other Ambulatory Visit: Payer: Self-pay | Admitting: Pharmacist

## 2023-05-13 MED ORDER — MOUNJARO 12.5 MG/0.5ML ~~LOC~~ SOAJ
12.5000 mg | SUBCUTANEOUS | 3 refills | Status: DC
  Filled 2023-05-13: qty 2, 28d supply, fill #0
  Filled 2023-06-09: qty 2, 28d supply, fill #1

## 2023-05-14 ENCOUNTER — Other Ambulatory Visit (HOSPITAL_COMMUNITY): Payer: Self-pay

## 2023-05-14 DIAGNOSIS — Z1331 Encounter for screening for depression: Secondary | ICD-10-CM | POA: Diagnosis not present

## 2023-05-14 DIAGNOSIS — Z1330 Encounter for screening examination for mental health and behavioral disorders, unspecified: Secondary | ICD-10-CM | POA: Diagnosis not present

## 2023-05-14 MED ORDER — ESCITALOPRAM OXALATE 20 MG PO TABS
20.0000 mg | ORAL_TABLET | Freq: Every day | ORAL | 1 refills | Status: DC
  Filled 2023-05-14: qty 90, 90d supply, fill #0
  Filled 2023-08-11: qty 90, 90d supply, fill #1

## 2023-05-16 ENCOUNTER — Other Ambulatory Visit (HOSPITAL_COMMUNITY): Payer: Self-pay

## 2023-05-26 ENCOUNTER — Other Ambulatory Visit (HOSPITAL_COMMUNITY): Payer: Self-pay

## 2023-05-26 DIAGNOSIS — L508 Other urticaria: Secondary | ICD-10-CM | POA: Diagnosis not present

## 2023-05-26 MED ORDER — PREDNISONE 10 MG PO TABS
ORAL_TABLET | ORAL | 0 refills | Status: AC
  Filled 2023-05-26: qty 15, 5d supply, fill #0

## 2023-05-26 MED ORDER — CLOBETASOL PROPIONATE 0.05 % EX CREA
1.0000 | TOPICAL_CREAM | Freq: Two times a day (BID) | CUTANEOUS | 0 refills | Status: DC
  Filled 2023-05-26: qty 15, 30d supply, fill #0

## 2023-05-28 ENCOUNTER — Ambulatory Visit: Payer: Commercial Managed Care - PPO

## 2023-05-29 ENCOUNTER — Ambulatory Visit: Payer: Commercial Managed Care - PPO | Admitting: Surgical

## 2023-05-29 ENCOUNTER — Ambulatory Visit: Payer: Commercial Managed Care - PPO

## 2023-05-29 ENCOUNTER — Other Ambulatory Visit (INDEPENDENT_AMBULATORY_CARE_PROVIDER_SITE_OTHER): Payer: Commercial Managed Care - PPO

## 2023-05-29 DIAGNOSIS — M5412 Radiculopathy, cervical region: Secondary | ICD-10-CM

## 2023-05-29 DIAGNOSIS — M25512 Pain in left shoulder: Secondary | ICD-10-CM

## 2023-05-30 ENCOUNTER — Other Ambulatory Visit (HOSPITAL_COMMUNITY): Payer: Self-pay

## 2023-05-30 MED ORDER — HYDROCORTISONE 2.5 % EX CREA
1.0000 | TOPICAL_CREAM | Freq: Two times a day (BID) | CUTANEOUS | 0 refills | Status: DC
  Filled 2023-05-30: qty 30, 15d supply, fill #0

## 2023-05-31 ENCOUNTER — Encounter: Payer: Self-pay | Admitting: Orthopedic Surgery

## 2023-05-31 NOTE — Progress Notes (Signed)
Office Visit Note   Patient: Cristina [name]           Date of Birth: 02/06/1961           MRN: [id number] Visit Date: 05/29/2023 Requested by: Ethelda Chick, MD [address] [place],  [place] [address] PCP: Ethelda Chick, MD  Subjective: Chief Complaint  Patient presents with   Arm Pain    HPI: Cristina [name] is a 62 y.o. female who presents to the office reporting left arm pain.  Patient describes 2 weeks of left arm symptoms that she localizes to the lateral aspect of the shoulder with trapezius pain as well.  Denies any axial cervical spine pain, scapular pain, numbness/tingling.  Pain will radiate down to the bicep region but no radiation past the elbow.  She has history of prior left shoulder biceps tenodesis and distal clavicle excision.  She was seen postoperatively earlier this year and had glenohumeral injection in February that provided good relief for her shoulder pain at that time.  This does not feel exactly like the pain she had before and she feels like this pain is a constant pain that is not necessarily based on her activity level.  Pain will wake her up at night.  Takes Tylenol with some relief about every 4 hours.  She is also quite stressed due to the recent passing of her daughter about 3 weeks ago..                ROS: All systems reviewed are negative as they relate to the chief complaint within the history of present illness.  Patient denies fevers or chills.  Assessment & Plan: Visit Diagnoses:  1. Acute pain of left shoulder   2. Radiculopathy, cervical region     Plan: Patient is a 62 year old female who presents complaining of left shoulder pain.  Has pain from around the trapezius to the lateral shoulder down to the elbow at times.  Has had 2 weeks of symptoms that came on quite suddenly without history of injury.  Has previously had similar pain back in February that was made 100% better at least for a time with glenohumeral injection.  However, this  pain is a little bit less activity today and is more constant.  May be more related to cervical spine pathology especially with cervical spine radiographs demonstrating loss of lordosis and a little bit of asymmetric osteophyte formation on the left at C3-C4.  We did discuss a diagnostic/hopefully therapeutic glenohumeral injection today to see if they will have the same effect it has had previously but patient would like to hold off on this.  She would like to try physical therapy for her cervical spine as she is already about to start physical therapy for her low back pain anyway.  We will try this and follow-up in 6 weeks for clinical recheck with Dr. August Saucer and consideration of glenohumeral injection versus MRI of the cervical spine at that time.  Patient agreed with this plan.  Some of her onset of symptoms may be related to the grief/stress from recently losing her daughter 3 weeks ago.  Follow-Up Instructions: No follow-ups on file.   Orders:  Orders Placed This Encounter  Procedures   XR Cervical Spine 2 or 3 views   Ambulatory referral to Physical Therapy   No orders of the defined types were placed in this encounter.     Procedures: No procedures performed   Clinical Data: No additional  findings.  Objective: Vital Signs: LMP 11/11/2010   Physical Exam:  Constitutional: Patient appears well-developed HEENT:  Head: Normocephalic Eyes:EOM are normal Neck: Normal range of motion Cardiovascular: Normal rate Pulmonary/chest: Effort normal Neurologic: Patient is alert Skin: Skin is warm Psychiatric: Patient has normal mood and affect  Ortho Exam: Ortho exam demonstrates left shoulder with 40 degrees X rotation, 85 degrees abduction, 160 degrees forward elevation passively and actively.  Right shoulder with 45 degrees X rotation, 140 degrees abduction, 20 degrees forward elevation passively and actively.  Patient has intact EPL, FPL, rogation, pronation/supination, bicep,  tricep, deltoid.  Axillary nerve intact deltoid firing.  Excellent rotator cuff strength with supra, infra, subscap.  She has bicep contour that appears symmetric compared with the contralateral side.  No Popeye deformity.  She has no tenderness over the [facility] joint bilaterally.  Negative empty can test.  Negative O'Brien sign.  She does have some mild pain with passive motion of the shoulder at the ends of range of motion.  No cellulitis or skin changes noted.  Negative Spurling sign.  Negative Lhermitte sign.  Specialty Comments:  No specialty comments available.  Imaging: No results found.   PMFS History: Patient Active Problem List   Diagnosis Date Noted   Synovitis of left shoulder 11/07/2022   Degenerative superior labral anterior-to-posterior (SLAP) tear of left shoulder 11/07/2022   Arthritis of left acromioclavicular joint 11/07/2022   Biceps tendinitis of left upper extremity 11/07/2022   Aortic atherosclerosis (HCC) 08/14/2022   Status post left knee replacement 06/01/2021   Status post total left knee replacement 05/31/2021   Presence of left artificial knee joint 05/31/2021   Unilateral primary osteoarthritis, left knee 12/21/2020   S/P gastric bypass 08/01/2018   Morbid obesity with body mass index (BMI) of 45.0 to 49.9 in adult (HCC) 07/06/2018   Type 2 diabetes mellitus without complication, without long-term current use of insulin (HCC) 05/22/2017   Class 3 obesity with serious comorbidity and body mass index (BMI) of 40.0 to 44.9 in adult 05/21/2017   Family history of colon cancer 04/01/2017   Irritable bowel syndrome with diarrhea 04/01/2017   NSAID long-term use 04/01/2017   Lymphocytosis 02/24/2017   Essential hypertension 02/12/2017   Mild intermittent asthma with acute exacerbation 12/12/2016   Left shoulder pain 06/02/2015   Insomnia 05/12/2014   Fibromyalgia 05/12/2014   Allergic rhinitis 03/28/2014   Osteoarthritis 12/20/2013   Primary osteoarthritis of  both knees 12/20/2013   Cervical strain 06/21/2013   Low back pain 06/21/2013   Metatarsalgia of both feet 03/11/2013   Sciatica 01/25/2013   Restless leg syndrome 11/24/2012   Pure hypercholesterolemia 10/20/2012   OSA on CPAP 10/20/2012   Depression 10/20/2012   Iron deficiency anemia, unspecified 10/01/2012   Snoring 10/01/2012   Diabetes mellitus, type 2 (HCC) 12/31/2010   ANAL FISSURE 12/31/2010   Morbid obesity (HCC) 01/08/2007   CONSTIPATION 01/08/2007   Past Medical History:  Diagnosis Date   Allergic rhinitis, cause unspecified    Anemia    Arthritis    Asthma    Back pain    Chest pain    Constipation    CTS (carpal tunnel syndrome)    Cystocele    Depression    Diabetes mellitus    Dysfunction of eustachian tube    Essential hypertension 02/12/2017   Family history of adverse reaction to anesthesia    daughter hard to wake up   Fatty liver    Fibromyalgia    Fissure,  anal    Gallbladder problem    Genital herpes    GERD (gastroesophageal reflux disease)    Headache    Hemorrhoid    Hx of migraine headaches    Hyperlipidemia    IBS (irritable bowel syndrome)    Insomnia    Iron deficiency anemia, unspecified    Joint pain    Nausea    Obesity    OSA on CPAP    Osteoarthritis    Rectocele    Sleep apnea    lost weight, does not use CPAP   TMJ (dislocation of temporomandibular joint)     Family History  Problem Relation Age of Onset   Hypertension Mother 15   Osteoarthritis Mother    Diabetes Mother    Colon cancer Mother 79   Cancer Mother 13       colon cancer   Hyperlipidemia Mother    Obesity Mother    Sudden death Mother    Arthritis Mother    Colon polyps Brother        x 2 brother   Cirrhosis Brother        alcoholism   Alcohol abuse Brother    Sleep apnea Brother    Hyperlipidemia Brother    Migraines Brother    Obstructive Sleep Apnea Brother    Colon cancer Paternal Aunt    Stroke Maternal Grandmother    Colon cancer  Paternal Grandmother    Cancer Paternal Grandmother        colon cancer   Diabetes Daughter    Hypertension Daughter    Sleep apnea Daughter    Mental illness Daughter        depression   Migraines Daughter    Migraines Daughter    Mental illness Daughter        anxiety attacks   Protein S deficiency Daughter    Esophageal cancer Neg Hx    Stomach cancer Neg Hx    Pancreatic cancer Neg Hx     Past Surgical History:  Procedure Laterality Date   ABDOMINAL HYSTERECTOMY  11/11/2010   Senaida Ores.  Ovaries intact.  Uterine fibroids with DUB.   BICEPT TENODESIS Left 10/29/2022   Procedure: BICEPS TENODESIS;  Surgeon: Cammy Copa, MD;  Location: [facility] OR;  Service: Orthopedics;  Laterality: Left;   CARPAL TUNNEL RELEASE  11/12/1987   Left   CHOLECYSTECTOMY     CHOLECYSTECTOMY     COLONOSCOPY  06/29/2012   normal.  Repeat in 5 years.   DILATION AND CURETTAGE OF UTERUS     ECTOPIC PREGNANCY SURGERY  11/12/1991   ESOPHAGOGASTRODUODENOSCOPY  12/13/2011   dysphagia.  Marina Goodell.  Normal.   GASTRIC ROUX-EN-Y N/A 07/06/2018   Procedure: LAPAROSCOPIC ROUX-EN-Y GASTRIC BYPASS WITH UPPER ENDOSCOPY;  Surgeon: Glenna Fellows, MD;  Location: [facility] ORS;  Service: General;  Laterality: N/A;   SHOULDER ARTHROSCOPY WITH OPEN ROTATOR CUFF REPAIR AND DISTAL CLAVICLE ACROMINECTOMY Left 10/29/2022   Procedure: LEFT SHOULDER ACROMIOCLAVICULAR RESECTION, DEBRIDEMENT;  Surgeon: Cammy Copa, MD;  Location: [facility] OR;  Service: Orthopedics;  Laterality: Left;   Sleep study  09/11/2012   severe OSA.  CPAP titration at 12 cm water pressure.   TOTAL KNEE ARTHROPLASTY Left 05/31/2021   Procedure: LEFT TOTAL KNEE ARTHROPLASTY;  Surgeon: Kathryne Hitch, MD;  Location: [facility] OR;  Service: Orthopedics;  Laterality: Left;   TUBAL LIGATION  11/11/1986   Social History   Occupational History   Occupation: Electrical engineer: [employer]    Comment: cma/[employer]  Tobacco Use    Smoking status: Former    Types: Cigarettes   Smokeless tobacco: Never  Vaping Use   Vaping status: Never Used  Substance and Sexual Activity   Alcohol use: No    Alcohol/week: 1.0 standard drink of alcohol    Types: 1 Glasses of wine per week   Drug use: No   Sexual activity: Not Currently    Birth control/protection: None

## 2023-06-04 ENCOUNTER — Ambulatory Visit: Payer: Commercial Managed Care - PPO | Admitting: Orthopedic Surgery

## 2023-06-05 ENCOUNTER — Ambulatory Visit
Admission: RE | Admit: 2023-06-05 | Discharge: 2023-06-05 | Disposition: A | Discharge status: Home / Self Care | Payer: Commercial Managed Care - PPO | Source: Ambulatory Visit | Attending: Family Medicine | Admitting: Family Medicine

## 2023-06-05 DIAGNOSIS — Z1231 Encounter for screening mammogram for malignant neoplasm of breast: Secondary | ICD-10-CM | POA: Diagnosis not present

## 2023-06-06 ENCOUNTER — Ambulatory Visit: Payer: Commercial Managed Care - PPO | Admitting: Surgical

## 2023-06-06 ENCOUNTER — Other Ambulatory Visit: Payer: Self-pay

## 2023-06-06 ENCOUNTER — Telehealth: Payer: Self-pay | Admitting: Surgical

## 2023-06-06 DIAGNOSIS — M25512 Pain in left shoulder: Secondary | ICD-10-CM

## 2023-06-06 DIAGNOSIS — M19012 Primary osteoarthritis, left shoulder: Secondary | ICD-10-CM

## 2023-06-06 NOTE — Telephone Encounter (Signed)
Per luke ok to be seen, she said she is just around the corner. Add pt back to the schedule

## 2023-06-06 NOTE — Telephone Encounter (Signed)
She can come in at 1PM today

## 2023-06-06 NOTE — Telephone Encounter (Signed)
Patient called advised she just got the message from mychart that she had an appointment today at 1:00pm with [facility]. Patient said she was waiting for a phone call concerning the appointment. The number to contact patient is [phone]

## 2023-06-06 NOTE — Therapy (Signed)
OUTPATIENT PHYSICAL THERAPY EVALUATION   Patient Name: Cristina [name] MRN: [id number] DOB:1961/08/09, 62 y.o., female Today's Date: 06/10/2023   END OF SESSION:  PT End of Session - 06/10/23 0754     Visit Number 1    Number of Visits 9    Date for PT Re-Evaluation 08/04/23    Authorization Type [facility] [employer]    PT Start Time 1530    PT Stop Time 1615    PT Time Calculation (min) 45 min    Activity Tolerance Patient tolerated treatment well    Behavior During Therapy WFL for tasks assessed/performed             Past Medical History:  Diagnosis Date   Allergic rhinitis, cause unspecified    Anemia    Arthritis    Asthma    Back pain    Chest pain    Constipation    CTS (carpal tunnel syndrome)    Cystocele    Depression    Diabetes mellitus    Dysfunction of eustachian tube    Essential hypertension 02/12/2017   Family history of adverse reaction to anesthesia    daughter hard to wake up   Fatty liver    Fibromyalgia    Fissure, anal    Gallbladder problem    Genital herpes    GERD (gastroesophageal reflux disease)    Headache    Hemorrhoid    Hx of migraine headaches    Hyperlipidemia    IBS (irritable bowel syndrome)    Insomnia    Iron deficiency anemia, unspecified    Joint pain    Nausea    Obesity    OSA on CPAP    Osteoarthritis    Rectocele    Sleep apnea    lost weight, does not use CPAP   TMJ (dislocation of temporomandibular joint)    Past Surgical History:  Procedure Laterality Date   ABDOMINAL HYSTERECTOMY  11/11/2010   Senaida Ores.  Ovaries intact.  Uterine fibroids with DUB.   BICEPT TENODESIS Left 10/29/2022   Procedure: BICEPS TENODESIS;  Surgeon: Cammy Copa, MD;  Location: [facility] OR;  Service: Orthopedics;  Laterality: Left;   CARPAL TUNNEL RELEASE  11/12/1987   Left   CHOLECYSTECTOMY     CHOLECYSTECTOMY     COLONOSCOPY  06/29/2012   normal.  Repeat in 5 years.   DILATION AND CURETTAGE OF UTERUS     ECTOPIC PREGNANCY  SURGERY  11/12/1991   ESOPHAGOGASTRODUODENOSCOPY  12/13/2011   dysphagia.  Marina Goodell.  Normal.   GASTRIC ROUX-EN-Y N/A 07/06/2018   Procedure: LAPAROSCOPIC ROUX-EN-Y GASTRIC BYPASS WITH UPPER ENDOSCOPY;  Surgeon: Glenna Fellows, MD;  Location: [facility];  Service: General;  Laterality: N/A;   SHOULDER ARTHROSCOPY WITH OPEN ROTATOR CUFF REPAIR AND DISTAL CLAVICLE ACROMINECTOMY Left 10/29/2022   Procedure: LEFT SHOULDER ACROMIOCLAVICULAR RESECTION, DEBRIDEMENT;  Surgeon: Cammy Copa, MD;  Location: [facility] OR;  Service: Orthopedics;  Laterality: Left;   Sleep study  09/11/2012   severe OSA.  CPAP titration at 12 cm water pressure.   TOTAL KNEE ARTHROPLASTY Left 05/31/2021   Procedure: LEFT TOTAL KNEE ARTHROPLASTY;  Surgeon: Kathryne Hitch, MD;  Location: [facility] OR;  Service: Orthopedics;  Laterality: Left;   TUBAL LIGATION  11/11/1986   Patient Active Problem List   Diagnosis Date Noted   Synovitis of left shoulder 11/07/2022   Degenerative superior labral anterior-to-posterior (SLAP) tear of left shoulder 11/07/2022   Arthritis of left acromioclavicular joint 11/07/2022   Biceps  tendinitis of left upper extremity 11/07/2022   Aortic atherosclerosis (HCC) 08/14/2022   Status post left knee replacement 06/01/2021   Status post total left knee replacement 05/31/2021   Presence of left artificial knee joint 05/31/2021   Unilateral primary osteoarthritis, left knee 12/21/2020   S/P gastric bypass 08/01/2018   Morbid obesity with body mass index (BMI) of 45.0 to 49.9 in adult [facility]) 07/06/2018   Type 2 diabetes mellitus without complication, without long-term current use of insulin (HCC) 05/22/2017   Class 3 obesity with serious comorbidity and body mass index (BMI) of 40.0 to 44.9 in adult 05/21/2017   Family history of colon cancer 04/01/2017   Irritable bowel syndrome with diarrhea 04/01/2017   NSAID long-term use 04/01/2017   Lymphocytosis 02/24/2017   Essential hypertension  02/12/2017   Mild intermittent asthma with acute exacerbation 12/12/2016   Left shoulder pain 06/02/2015   Insomnia 05/12/2014   Fibromyalgia 05/12/2014   Allergic rhinitis 03/28/2014   Osteoarthritis 12/20/2013   Primary osteoarthritis of both knees 12/20/2013   Cervical strain 06/21/2013   Low back pain 06/21/2013   Metatarsalgia of both feet 03/11/2013   Sciatica 01/25/2013   Restless leg syndrome 11/24/2012   Pure hypercholesterolemia 10/20/2012   OSA on CPAP 10/20/2012   Depression 10/20/2012   Iron deficiency anemia, unspecified 10/01/2012   Snoring 10/01/2012   Diabetes mellitus, type 2 (HCC) 12/31/2010   ANAL FISSURE 12/31/2010   Morbid obesity (HCC) 01/08/2007   CONSTIPATION 01/08/2007    PCP: Ethelda Chick, MD  REFERRING PROVIDER: Julieanne Cotton, PA-C  REFERRING DIAG: Radiculopathy, cervical region  THERAPY DIAG:  Other low back pain  Pain in left arm  Muscle weakness (generalized)  Rationale for Evaluation and Treatment: Rehabilitation  ONSET DATE: Chronic   SUBJECTIVE:                                                                                                                                                                                                        SUBJECTIVE STATEMENT: Patient reports her back and hips will hurt here and there, if she sits, stand, or walks too long then it gets aggravated. She is also having left arm pain that hurts all the time. She did have an injection in the left shoulder on Friday which may have helped a little bit. She has had back pain for year and had rehab before that didn't help. She can walk for about 10 minutes before she feels like she needs a rest break. Her left arm has been bothering her for about 3  weeks without any mechanism. She reports that picking up things or driving will aggravate the left arm, and she has had some trouble sleeping because of the pain. She sits all day at work and they were supposed  to get here a standing desk but hasn't gotten it yet.  Hand dominance: Right  PERTINENT HISTORY:  Left shoulder arthroscopy with biceps tenodesis and distal clavicle excision 10/29/2022  PAIN:  Are you having pain? Yes:  NPRS scale: 0/10 (6/10 at worst) Pain location: Back and hips Pain description: Throbbing Aggravating factors: Sitting, standing, walking Relieving factors: Rest, ice  NPRS scale: 4/10 Pain location: Left upper arm Pain description: Aching Aggravating factors: Picking up things, driving Relieving factors: Medication  PRECAUTIONS: None  RED FLAGS: None    WEIGHT BEARING RESTRICTIONS: No  FALLS:  Has patient fallen in last 6 months? No  OCCUPATION: CMA, sitting at computer  PLOF: Independent  PATIENT GOALS: Pain relief   OBJECTIVE:  PATIENT SURVEYS:  FOTO 47% back, 48% upper arm functional status  COGNITION: Overall cognitive status: Within functional limits for tasks assessed  SENSATION: WFL  POSTURE:   Rounded shoulder and forward head posture  PALPATION: Tender to palpation left upper trap, infraspinatus, bicep tendon and muscle belly, anterior and middle deltoid   LUMBAR ROM:   Active  A/PROM  eval  Flexion WFL  Extension WFL  Right lateral flexion WFL  Left lateral flexion WFL  Right rotation WFL  Left rotation WFL   (Blank rows = not tested)  CERVICAL ROM:   Active ROM A/PROM (deg) eval  Flexion 35  Extension 35  Right lateral flexion 15  Left lateral flexion 15  Right rotation 50  Left rotation 45   (Blank rows = not tested)  UPPER EXTREMITY ROM:  Active ROM Right eval Left eval  Shoulder flexion 160 150  Shoulder extension    Shoulder abduction  130  Shoulder adduction    Shoulder extension    Shoulder internal rotation T8 L1  Shoulder external rotation T4 T2  Elbow flexion    Elbow extension    Wrist flexion    Wrist extension    Wrist ulnar deviation    Wrist radial deviation    Wrist pronation     Wrist supination     (Blank rows = not tested)  UPPER EXTREMITY MMT:  MMT Right eval Left eval  Shoulder flexion 5 4  Shoulder extension    Shoulder abduction 5 4  Shoulder adduction    Shoulder extension    Shoulder internal rotation 5 5  Shoulder external rotation 5 4  Middle trapezius    Lower trapezius    Elbow flexion    Elbow extension    Wrist flexion    Wrist extension    Wrist ulnar deviation    Wrist radial deviation    Wrist pronation    Wrist supination    Grip strength     (Blank rows = not tested)  LOWER EXTREMITY MMT:    MMT Right eval Left eval  Hip flexion 4 4  Hip extension 4- 4-  Hip abduction 4- 4-  Hip adduction    Hip internal rotation    Hip external rotation    Knee flexion 5 5  Knee extension 5 5  Ankle dorsiflexion    Ankle plantarflexion    Ankle inversion    Ankle eversion     (Blank rows = not tested)   CERVICAL/LUMBAR SPECIAL TESTS:  Radicular testing negative  FUNCTIONAL TESTS:  Not assessed   TODAY'S TREATMENT:    [facility] Adult PT Treatment:                                                DATE: 06/09/2023 Therapeutic Exercise: Bridge x 10 Clamshell with red x 10 Shoulder ER with red x 10 Row with green x 10  PATIENT EDUCATION:  Education details: Exam findings, POC, HEP Person educated: Patient Education method: Explanation, Demonstration, Tactile cues, Verbal cues, and Handouts Education comprehension: verbalized understanding, returned demonstration, verbal cues required, tactile cues required, and needs further education  HOME EXERCISE PROGRAM: Access Code: [email or web address]   ASSESSMENT: CLINICAL IMPRESSION: Patient is a 62 y.o. female who was seen today for physical therapy evaluation and treatment for chronic low back and hip pain, and left arm pain. Regarding her low back pain, she exhibit generally good ROM but with core and hip strength deficits that are likely contributing her her pain with extended periods of  sitting, standing, and walking. Her left upper arm pain seem to be most likely related to rotator cuff strength deficit. She does exhibit some muscular tightness of the left neck and shoulder but does not seem to demonstrate any radicular symptoms of the cervical region.    OBJECTIVE IMPAIRMENTS: decreased activity tolerance, decreased ROM, decreased strength, impaired flexibility, postural dysfunction, and pain.   ACTIVITY LIMITATIONS: carrying, lifting, sitting, standing, sleeping, reach over head, hygiene/grooming, and locomotion level  PARTICIPATION LIMITATIONS: cleaning, driving, shopping, community activity, and occupation  PERSONAL FACTORS: Fitness, Past/current experiences, and Time since onset of injury/illness/exacerbation are also affecting patient's functional outcome.   REHAB POTENTIAL: Good  CLINICAL DECISION MAKING: Stable/uncomplicated  EVALUATION COMPLEXITY: Low   GOALS: Goals reviewed with patient? Yes  SHORT TERM GOALS: Target date: 07/07/2023  Patient will be I with initial HEP in order to progress with therapy. Baseline: HEP provided at eval Goal status: INITIAL  2.  Patient will report left upper arm/shoulder pain </= 2/10 in order to reduce functional limitations Baseline: 4/10 Goal status: INITIAL  3.  Patient will be able to walk and/or stand > 10 minutes with </= 3/10 low back and hip pain to improve grocery shopping ability and community access Baseline: 6/10 pain with walking standing 10 minutes Goal status: INITIAL  LONG TERM GOALS: Target date: 08/04/2023  Patient will be I with final HEP to maintain progress from PT. Baseline: HEP provided at eval Goal status: INITIAL  2.  Patient will report >/= 56% back, 60% upper arm status on FOTO to indicate improved functional ability. Baseline: 47% back, 48% upper arm functional status Goal status: INITIAL  3.  Patient will exhibit left shoulder strength 5/5 MMT in order to improve ability to perform  lifting tasks without pain or limitation Baseline: see limitations above Goal status: INITIAL  4.  Patient will demonstrate gross core and hip strength >/= 4/5 MMT in order to improve her standing, walking, and sitting tolerance with less pain Baseline: see limitations above Goal status: INITIAL   PLAN: PT FREQUENCY: 1x/week  PT DURATION: 8 weeks  PLANNED INTERVENTIONS: Therapeutic exercises, Therapeutic activity, Neuromuscular re-education, Balance training, Gait training, Patient/Family education, Self Care, Joint mobilization, Joint manipulation, Aquatic Therapy, Dry Needling, Electrical stimulation, Spinal manipulation, Spinal mobilization, Cryotherapy, Moist heat, Taping, Ionotophoresis 4mg /ml Dexamethasone, Manual therapy, and Re-evaluation  PLAN FOR NEXT SESSION: Review HEP  and progress PRN, manual/TPDN for left upper trap and shoulder region, cervical and left shoulder stretching/mobility, left rotator cuff and postural strengthening, core and hip strengthening and control   Rosana Hoes, PT, DPT, LAT, ATC 06/10/23  12:41 PM Phone: [phone] Fax: [phone]

## 2023-06-07 ENCOUNTER — Encounter: Payer: Self-pay | Admitting: Surgical

## 2023-06-07 MED ORDER — BUPIVACAINE HCL 0.25 % IJ SOLN
9.0000 mL | INTRAMUSCULAR | Status: AC | PRN
Start: 2023-06-06 — End: 2023-06-06
  Administered 2023-06-06: 9 mL via INTRA_ARTICULAR

## 2023-06-07 MED ORDER — METHYLPREDNISOLONE ACETATE 40 MG/ML IJ SUSP
40.0000 mg | INTRAMUSCULAR | Status: AC | PRN
Start: 2023-06-06 — End: 2023-06-06
  Administered 2023-06-06: 40 mg via INTRA_ARTICULAR

## 2023-06-07 MED ORDER — LIDOCAINE HCL 1 % IJ SOLN
5.0000 mL | INTRAMUSCULAR | Status: AC | PRN
Start: 2023-06-06 — End: 2023-06-06
  Administered 2023-06-06: 5 mL

## 2023-06-07 NOTE — Progress Notes (Signed)
   Procedure Note  Patient: Cristina [name]             Date of Birth: 1961-03-24           MRN: [id number]             Visit Date: 06/06/2023  Procedures: Visit Diagnoses:  1. Acute pain of left shoulder     Large Joint Inj: L glenohumeral on 06/06/2023 11:13 AM Details: 22 G 3.5 in needle, ultrasound-guided posterior approach Medications: 5 mL lidocaine 1 %; 9 mL bupivacaine 0.25 %; 40 mg methylPREDNISolone acetate 40 MG/ML Outcome: tolerated well, no immediate complications  Patient comes in for planned left glenohumeral injection.  We will see how this does for her.  Send me a message in 1 week and if no improvement, neck step would be to consider MRI cervical spine to evaluate her shoulder pain.  She has had similar pain in the past that was 100% resolved for several months with glenohumeral injection.  Procedure, treatment alternatives, risks and benefits explained, specific risks discussed. Consent was given by the patient. Patient was prepped and draped in the usual sterile fashion.

## 2023-06-09 ENCOUNTER — Other Ambulatory Visit: Payer: Self-pay

## 2023-06-09 ENCOUNTER — Ambulatory Visit: Payer: Commercial Managed Care - PPO | Attending: Physical Medicine & Rehabilitation | Admitting: Physical Therapy

## 2023-06-09 ENCOUNTER — Other Ambulatory Visit (HOSPITAL_COMMUNITY): Payer: Self-pay

## 2023-06-09 ENCOUNTER — Encounter: Payer: Self-pay | Admitting: Physical Therapy

## 2023-06-09 DIAGNOSIS — G8929 Other chronic pain: Secondary | ICD-10-CM | POA: Insufficient documentation

## 2023-06-09 DIAGNOSIS — M79602 Pain in left arm: Secondary | ICD-10-CM | POA: Diagnosis not present

## 2023-06-09 DIAGNOSIS — M545 Low back pain, unspecified: Secondary | ICD-10-CM | POA: Diagnosis not present

## 2023-06-09 DIAGNOSIS — M5459 Other low back pain: Secondary | ICD-10-CM | POA: Insufficient documentation

## 2023-06-09 DIAGNOSIS — M6281 Muscle weakness (generalized): Secondary | ICD-10-CM | POA: Diagnosis not present

## 2023-06-10 NOTE — Patient Instructions (Signed)
Access Code: [email or web address] URL: [email or web address] Date: 06/10/2023 Prepared by: Rosana Hoes  Exercises - Bridge  - 1 x daily - 3 sets - 10 reps - Clam with Resistance  - 1 x daily - 3 sets - 10 reps - Shoulder External Rotation with Anchored Resistance  - 1 x daily - 3 sets - 10 reps - Standing Row with Anchored Resistance  - 1 x daily - 3 sets - 10 reps

## 2023-06-11 ENCOUNTER — Telehealth: Payer: Self-pay | Admitting: Pharmacist

## 2023-06-11 ENCOUNTER — Other Ambulatory Visit (HOSPITAL_COMMUNITY): Payer: Self-pay

## 2023-06-11 MED ORDER — TIRZEPATIDE 15 MG/0.5ML ~~LOC~~ SOAJ
15.0000 mg | SUBCUTANEOUS | 3 refills | Status: DC
  Filled 2023-06-11 – 2023-07-02 (×4): qty 6, 84d supply, fill #0
  Filled 2023-07-02: qty 2, 28d supply, fill #0
  Filled 2023-07-27: qty 2, 28d supply, fill #1
  Filled 2023-08-24: qty 2, 28d supply, fill #2
  Filled 2023-09-21: qty 2, 28d supply, fill #3
  Filled 2023-10-16: qty 2, 28d supply, fill #4
  Filled 2023-10-17: qty 6, 84d supply, fill #4
  Filled 2024-01-08: qty 6, 84d supply, fill #5
  Filled 2024-03-30: qty 4, 56d supply, fill #6

## 2023-06-11 NOTE — Telephone Encounter (Signed)
Pt taking Mounjaro 12.5mg , tolerating well and wishes to increase to 15mg  dose as 90 day rx. Refill sent in.

## 2023-06-12 ENCOUNTER — Ambulatory Visit: Payer: Commercial Managed Care - PPO | Admitting: Orthopedic Surgery

## 2023-06-13 DIAGNOSIS — F331 Major depressive disorder, recurrent, moderate: Secondary | ICD-10-CM | POA: Diagnosis not present

## 2023-06-13 DIAGNOSIS — F4321 Adjustment disorder with depressed mood: Secondary | ICD-10-CM | POA: Diagnosis not present

## 2023-06-17 ENCOUNTER — Telehealth: Payer: Self-pay | Admitting: Surgical

## 2023-06-17 NOTE — Telephone Encounter (Signed)
Called patient about her last GH injection. Good relief, still has occ aching pain but not nearly like it was prior to injection.  Plan to hold off on c-spine MRI

## 2023-06-20 ENCOUNTER — Other Ambulatory Visit (HOSPITAL_COMMUNITY): Payer: Self-pay

## 2023-06-20 ENCOUNTER — Other Ambulatory Visit: Payer: Self-pay

## 2023-06-20 DIAGNOSIS — M542 Cervicalgia: Secondary | ICD-10-CM

## 2023-06-20 NOTE — Telephone Encounter (Signed)
We can order mri c spine if that's the case.  Or continue with PT

## 2023-06-26 ENCOUNTER — Ambulatory Visit: Payer: Commercial Managed Care - PPO | Admitting: Physical Therapy

## 2023-06-30 ENCOUNTER — Other Ambulatory Visit: Payer: Self-pay

## 2023-06-30 ENCOUNTER — Other Ambulatory Visit (HOSPITAL_COMMUNITY): Payer: Self-pay

## 2023-07-01 ENCOUNTER — Other Ambulatory Visit (HOSPITAL_COMMUNITY): Payer: Self-pay

## 2023-07-02 ENCOUNTER — Ambulatory Visit: Payer: Commercial Managed Care - PPO | Attending: Physical Medicine & Rehabilitation

## 2023-07-02 ENCOUNTER — Other Ambulatory Visit (HOSPITAL_COMMUNITY): Payer: Self-pay

## 2023-07-02 DIAGNOSIS — M6281 Muscle weakness (generalized): Secondary | ICD-10-CM | POA: Diagnosis not present

## 2023-07-02 DIAGNOSIS — M5459 Other low back pain: Secondary | ICD-10-CM | POA: Diagnosis not present

## 2023-07-02 DIAGNOSIS — M79602 Pain in left arm: Secondary | ICD-10-CM | POA: Insufficient documentation

## 2023-07-02 NOTE — Therapy (Addendum)
OUTPATIENT PHYSICAL THERAPY TREATMENT  DISCHARGE   Patient Name: Cristina [name] MRN: [id number] DOB:11-Jun-1961, 62 y.o., female Today's Date: 07/02/2023   END OF SESSION:  PT End of Session - 07/02/23 1611     Visit Number 2    Number of Visits 9    Date for PT Re-Evaluation 08/04/23    Authorization Type [facility] [employer]    PT Start Time 0411    PT Stop Time 0451    PT Time Calculation (min) 40 min    Activity Tolerance Patient tolerated treatment well    Behavior During Therapy [facility] for tasks assessed/performed              Past Medical History:  Diagnosis Date   Allergic rhinitis, cause unspecified    Anemia    Arthritis    Asthma    Back pain    Chest pain    Constipation    CTS (carpal tunnel syndrome)    Cystocele    Depression    Diabetes mellitus    Dysfunction of eustachian tube    Essential hypertension 02/12/2017   Family history of adverse reaction to anesthesia    daughter hard to wake up   Fatty liver    Fibromyalgia    Fissure, anal    Gallbladder problem    Genital herpes    GERD (gastroesophageal reflux disease)    Headache    Hemorrhoid    Hx of migraine headaches    Hyperlipidemia    IBS (irritable bowel syndrome)    Insomnia    Iron deficiency anemia, unspecified    Joint pain    Nausea    Obesity    OSA on CPAP    Osteoarthritis    Rectocele    Sleep apnea    lost weight, does not use CPAP   TMJ (dislocation of temporomandibular joint)    Past Surgical History:  Procedure Laterality Date   ABDOMINAL HYSTERECTOMY  11/11/2010   Senaida Ores.  Ovaries intact.  Uterine fibroids with DUB.   BICEPT TENODESIS Left 10/29/2022   Procedure: BICEPS TENODESIS;  Surgeon: Cammy Copa, MD;  Location: [facility] OR;  Service: Orthopedics;  Laterality: Left;   CARPAL TUNNEL RELEASE  11/12/1987   Left   CHOLECYSTECTOMY     CHOLECYSTECTOMY     COLONOSCOPY  06/29/2012   normal.  Repeat in 5 years.   DILATION AND CURETTAGE OF UTERUS     ECTOPIC  PREGNANCY SURGERY  11/12/1991   ESOPHAGOGASTRODUODENOSCOPY  12/13/2011   dysphagia.  Marina Goodell.  Normal.   GASTRIC ROUX-EN-Y N/A 07/06/2018   Procedure: LAPAROSCOPIC ROUX-EN-Y GASTRIC BYPASS WITH UPPER ENDOSCOPY;  Surgeon: Glenna Fellows, MD;  Location: [facility];  Service: General;  Laterality: N/A;   SHOULDER ARTHROSCOPY WITH OPEN ROTATOR CUFF REPAIR AND DISTAL CLAVICLE ACROMINECTOMY Left 10/29/2022   Procedure: LEFT SHOULDER ACROMIOCLAVICULAR RESECTION, DEBRIDEMENT;  Surgeon: Cammy Copa, MD;  Location[facility] OR;  Service: Orthopedics;  Laterality: Left;   Sleep study  09/11/2012   severe OSA.  CPAP titration at 12 cm water pressure.   TOTAL KNEE ARTHROPLASTY Left 05/31/2021   Procedure: LEFT TOTAL KNEE ARTHROPLASTY;  Surgeon: Kathryne Hitch, MD;  Location: [facility] OR;  Service: Orthopedics;  Laterality: Left;   TUBAL LIGATION  11/11/1986   Patient Active Problem List   Diagnosis Date Noted   Synovitis of left shoulder 11/07/2022   Degenerative superior labral anterior-to-posterior (SLAP) tear of left shoulder 11/07/2022   Arthritis of left acromioclavicular joint  11/07/2022   Biceps tendinitis of left upper extremity 11/07/2022   Aortic atherosclerosis (HCC) 08/14/2022   Status post left knee replacement 06/01/2021   Status post total left knee replacement 05/31/2021   Presence of left artificial knee joint 05/31/2021   Unilateral primary osteoarthritis, left knee 12/21/2020   S/P gastric bypass 08/01/2018   Morbid obesity with body mass index (BMI) of 45.0 to 49.9 in adult [facility]) 07/06/2018   Type 2 diabetes mellitus without complication, without long-term current use of insulin (HCC) 05/22/2017   Class 3 obesity with serious comorbidity and body mass index (BMI) of 40.0 to 44.9 in adult 05/21/2017   Family history of colon cancer 04/01/2017   Irritable bowel syndrome with diarrhea 04/01/2017   NSAID long-term use 04/01/2017   Lymphocytosis 02/24/2017   Essential hypertension  02/12/2017   Mild intermittent asthma with acute exacerbation 12/12/2016   Left shoulder pain 06/02/2015   Insomnia 05/12/2014   Fibromyalgia 05/12/2014   Allergic rhinitis 03/28/2014   Osteoarthritis 12/20/2013   Primary osteoarthritis of both knees 12/20/2013   Cervical strain 06/21/2013   Low back pain 06/21/2013   Metatarsalgia of both feet 03/11/2013   Sciatica 01/25/2013   Restless leg syndrome 11/24/2012   Pure hypercholesterolemia 10/20/2012   OSA on CPAP 10/20/2012   Depression 10/20/2012   Iron deficiency anemia, unspecified 10/01/2012   Snoring 10/01/2012   Diabetes mellitus, type 2 (HCC) 12/31/2010   ANAL FISSURE 12/31/2010   Morbid obesity (HCC) 01/08/2007   CONSTIPATION 01/08/2007    PCP: Ethelda Chick, MD  REFERRING PROVIDER: Julieanne Cotton, PA-C  REFERRING DIAG: Radiculopathy, cervical region  THERAPY DIAG:  Other low back pain  Pain in left arm  Muscle weakness (generalized)  Rationale for Evaluation and Treatment: Rehabilitation  ONSET DATE: Chronic   SUBJECTIVE:                                                                                                                                                                                                        SUBJECTIVE STATEMENT: Patient is reporting worse UE and neck pain today that is 7-8/10. "I'm not having much pain in my back." She would like to focus today's session on her UQ.   Hand dominance: Right  PAIN:  Are you having pain? Yes:  NPRS scale: 0/10 (6/10 at worst) Pain location: Back and hips Pain description: Throbbing Aggravating factors: Sitting, standing, walking Relieving factors: Rest, ice  NPRS scale: 4/10 Pain location: Left upper arm Pain description: Aching Aggravating factors: Picking up things, driving Relieving factors: Medication  PERTINENT HISTORY:  Left shoulder arthroscopy with biceps tenodesis and distal clavicle excision 10/29/2022  PRECAUTIONS:  None  PATIENT GOALS: Pain relief   OBJECTIVE:  PATIENT SURVEYS:  FOTO 47% back, 48% upper arm functional status  POSTURE:   Rounded shoulder and forward head posture  PALPATION: Tender to palpation left upper trap, infraspinatus, bicep tendon and muscle belly, anterior and middle deltoid   LUMBAR ROM:   Active  A/PROM  eval  Flexion WFL  Extension WFL  Right lateral flexion WFL  Left lateral flexion WFL  Right rotation WFL  Left rotation WFL   (Blank rows = not tested)  CERVICAL ROM:   Active ROM A/PROM (deg) eval  Flexion 35  Extension 35  Right lateral flexion 15  Left lateral flexion 15  Right rotation 50  Left rotation 45   (Blank rows = not tested)  UPPER EXTREMITY ROM:  Active ROM Right eval Left eval  Shoulder flexion 160 150  Shoulder extension    Shoulder abduction  130  Shoulder adduction    Shoulder extension    Shoulder internal rotation T8 L1  Shoulder external rotation T4 T2  Elbow flexion    Elbow extension    Wrist flexion    Wrist extension    Wrist ulnar deviation    Wrist radial deviation    Wrist pronation    Wrist supination     (Blank rows = not tested)  UPPER EXTREMITY MMT:  MMT Right eval Left eval  Shoulder flexion 5 4  Shoulder extension    Shoulder abduction 5 4  Shoulder adduction    Shoulder extension    Shoulder internal rotation 5 5  Shoulder external rotation 5 4  Middle trapezius    Lower trapezius    Elbow flexion    Elbow extension    Wrist flexion    Wrist extension    Wrist ulnar deviation    Wrist radial deviation    Wrist pronation    Wrist supination    Grip strength     (Blank rows = not tested)  LOWER EXTREMITY MMT:    MMT Right eval Left eval  Hip flexion 4 4  Hip extension 4- 4-  Hip abduction 4- 4-  Hip adduction    Hip internal rotation    Hip external rotation    Knee flexion 5 5  Knee extension 5 5  Ankle dorsiflexion    Ankle plantarflexion    Ankle inversion    Ankle  eversion     (Blank rows = not tested)   CERVICAL/LUMBAR SPECIAL TESTS:  Radicular testing negative  FUNCTIONAL TESTS:  Not assessed   TODAY'S TREATMENT:     [facility] Adult PT Treatment:                                                DATE: 07/02/2023  Therapeutic Exercise:  UBE level 1, fwd/back x 3' each  Supine Shoulder isometric 4-way with manual resistance 5 sec x several each (flex, abd, ext, add) Unable to tolerate at wall independently  Supine Shoulder abduction, hooklying, red TB, 3 x10  Supine Shoulder ER with red x 10 Standing row with red TB, 2 x 10  Shoulder ext/pull downs, red TB, 2 x 10    [facility] Adult PT Treatment:  DATE: 06/09/2023 Therapeutic Exercise: Bridge x 10 Clamshell with red x 10 Shoulder ER with red x 10 Row with green x 10  PATIENT EDUCATION:  Education details: HEP Person educated: Patient Education method: Explanation, Demonstration, Actor cues, Verbal cues Education comprehension: verbalized understanding, returned demonstration, verbal cues required, tactile cues required, and needs further education  HOME EXERCISE PROGRAM: Access Code: [email or web address]   ASSESSMENT: CLINICAL IMPRESSION: Patient tolerated therapy today without exacerbation of symptoms. Today's session focused on R shoulder pain modulation and strengthening. Patient would benefit from continued skilled PT to progress her mobility and strength in order to reduce pain and maximize functional ability.   OBJECTIVE IMPAIRMENTS: decreased activity tolerance, decreased ROM, decreased strength, impaired flexibility, postural dysfunction, and pain.   ACTIVITY LIMITATIONS: carrying, lifting, sitting, standing, sleeping, reach over head, hygiene/grooming, and locomotion level  PARTICIPATION LIMITATIONS: cleaning, driving, shopping, community activity, and occupation  PERSONAL FACTORS: Fitness, Past/current experiences, and Time since onset of  injury/illness/exacerbation are also affecting patient's functional outcome.    GOALS: Goals reviewed with patient? Yes  SHORT TERM GOALS: Target date: 07/07/2023  Patient will be I with initial HEP in order to progress with therapy. Baseline: HEP provided at eval Goal status: INITIAL  2.  Patient will report left upper arm/shoulder pain </= 2/10 in order to reduce functional limitations Baseline: 4/10 Goal status: INITIAL  3.  Patient will be able to walk and/or stand > 10 minutes with </= 3/10 low back and hip pain to improve grocery shopping ability and community access Baseline: 6/10 pain with walking standing 10 minutes Goal status: INITIAL  LONG TERM GOALS: Target date: 08/04/2023  Patient will be I with final HEP to maintain progress from PT. Baseline: HEP provided at eval Goal status: INITIAL  2.  Patient will report >/= 56% back, 60% upper arm status on FOTO to indicate improved functional ability. Baseline: 47% back, 48% upper arm functional status Goal status: INITIAL  3.  Patient will exhibit left shoulder strength 5/5 MMT in order to improve ability to perform lifting tasks without pain or limitation Baseline: see limitations above Goal status: INITIAL  4.  Patient will demonstrate gross core and hip strength >/= 4/5 MMT in order to improve her standing, walking, and sitting tolerance with less pain Baseline: see limitations above Goal status: INITIAL   PLAN: PT FREQUENCY: 1x/week  PT DURATION: 8 weeks  PLANNED INTERVENTIONS: Therapeutic exercises, Therapeutic activity, Neuromuscular re-education, Balance training, Gait training, Patient/Family education, Self Care, Joint mobilization, Joint manipulation, Aquatic Therapy, Dry Needling, Electrical stimulation, Spinal manipulation, Spinal mobilization, Cryotherapy, Moist heat, Taping, Ionotophoresis 4mg /ml Dexamethasone, Manual therapy, and Re-evaluation  PLAN FOR NEXT SESSION: Review HEP and progress PRN,  manual/TPDN for left upper trap and shoulder region, cervical and left shoulder stretching/mobility, left rotator cuff and postural strengthening, core and hip strengthening and control   Mauri Reading, PT, DPT  07/02/23  4:55 PM Phone: [phone] Fax: [phone]     PHYSICAL THERAPY DISCHARGE SUMMARY  Visits from Start of Care: 2  Current functional level related to goals / functional outcomes: See above   Remaining deficits: See above   Education / Equipment: HEP   Patient agrees to discharge. Patient goals were not met. Patient is being discharged due to not returning since the last visit.  Rosana Hoes, PT, DPT, LAT, ATC 09/01/23  12:52 PM Phone: [phone] Fax: [phone]

## 2023-07-08 ENCOUNTER — Other Ambulatory Visit: Payer: Self-pay | Admitting: Pharmacist

## 2023-07-08 ENCOUNTER — Ambulatory Visit: Payer: Commercial Managed Care - PPO

## 2023-07-08 ENCOUNTER — Other Ambulatory Visit: Payer: Self-pay

## 2023-07-08 ENCOUNTER — Other Ambulatory Visit (HOSPITAL_COMMUNITY): Payer: Self-pay

## 2023-07-08 DIAGNOSIS — I700 Atherosclerosis of aorta: Secondary | ICD-10-CM

## 2023-07-08 DIAGNOSIS — E7800 Pure hypercholesterolemia, unspecified: Secondary | ICD-10-CM

## 2023-07-08 MED ORDER — REPATHA SURECLICK 140 MG/ML ~~LOC~~ SOAJ
1.0000 mL | SUBCUTANEOUS | 3 refills | Status: DC
Start: 2023-07-08 — End: 2024-06-07
  Filled 2023-07-08: qty 6, 84d supply, fill #0
  Filled 2023-09-28: qty 6, 84d supply, fill #1
  Filled 2023-12-19: qty 6, 84d supply, fill #2
  Filled 2024-02-20 – 2024-02-24 (×2): qty 6, 84d supply, fill #3

## 2023-07-09 ENCOUNTER — Ambulatory Visit: Payer: Commercial Managed Care - PPO | Admitting: Orthopedic Surgery

## 2023-07-15 ENCOUNTER — Ambulatory Visit: Payer: Commercial Managed Care - PPO

## 2023-07-16 ENCOUNTER — Encounter (HOSPITAL_BASED_OUTPATIENT_CLINIC_OR_DEPARTMENT_OTHER): Payer: Self-pay

## 2023-07-16 ENCOUNTER — Telehealth: Payer: Self-pay | Admitting: Orthopedic Surgery

## 2023-07-16 NOTE — Telephone Encounter (Signed)
Okay for this

## 2023-07-16 NOTE — Telephone Encounter (Signed)
Note completed and sent to her mychart.

## 2023-07-16 NOTE — Telephone Encounter (Signed)
Patient is requesting note for work no lifting, no reaching x 1 month. She is waiting for MRI. Please provide note if okay. Thank you!

## 2023-07-17 ENCOUNTER — Ambulatory Visit
Admission: RE | Admit: 2023-07-17 | Discharge: 2023-07-17 | Disposition: A | Discharge status: Home / Self Care | Payer: Commercial Managed Care - PPO | Source: Ambulatory Visit | Attending: Surgical | Admitting: Surgical

## 2023-07-17 DIAGNOSIS — M542 Cervicalgia: Secondary | ICD-10-CM

## 2023-07-17 DIAGNOSIS — M47812 Spondylosis without myelopathy or radiculopathy, cervical region: Secondary | ICD-10-CM | POA: Diagnosis not present

## 2023-07-17 NOTE — Telephone Encounter (Signed)
Noted for [employer], they are completing forms.

## 2023-07-18 ENCOUNTER — Telehealth: Payer: Self-pay | Admitting: Pharmacist

## 2023-07-18 DIAGNOSIS — E7800 Pure hypercholesterolemia, unspecified: Secondary | ICD-10-CM

## 2023-07-18 NOTE — Telephone Encounter (Signed)
Pt stopped by my office inquiring about when she should have her cholesterol checked. She started Repatha last year and hasn't had updated labs. I have placed order for [facility] for next Monday for lab draw.

## 2023-07-21 ENCOUNTER — Ambulatory Visit: Payer: Commercial Managed Care - PPO

## 2023-07-21 ENCOUNTER — Ambulatory Visit (INDEPENDENT_AMBULATORY_CARE_PROVIDER_SITE_OTHER): Payer: Commercial Managed Care - PPO | Admitting: Orthopedic Surgery

## 2023-07-21 DIAGNOSIS — M5412 Radiculopathy, cervical region: Secondary | ICD-10-CM

## 2023-07-22 ENCOUNTER — Ambulatory Visit: Payer: Commercial Managed Care - PPO | Attending: Internal Medicine

## 2023-07-22 ENCOUNTER — Encounter: Payer: Self-pay | Admitting: Orthopedic Surgery

## 2023-07-22 DIAGNOSIS — E7800 Pure hypercholesterolemia, unspecified: Secondary | ICD-10-CM | POA: Diagnosis not present

## 2023-07-22 NOTE — Progress Notes (Signed)
Office Visit Note   Patient: Cristina [name]           Date of Birth: 25-Apr-1961           MRN: [id number] Visit Date: 07/21/2023 Requested by: Ethelda Chick, MD [address] [place],  [place] [address] PCP: Ethelda Chick, MD  Subjective: Chief Complaint  Patient presents with   Left Shoulder - Pain   Other     Review MR cervical spine    HPI: Cristina [name] is a 62 y.o. female who presents to the office reporting left shoulder neck pain and radiculopathy.  She did have a glenohumeral joint injection 06/06/2023 which gave her some relief but now her arm is getting numb.  She describes neck pain and stiffness.  Also has occasional scapular pain.  Describing more tingling down the arm.  First day was better with the glenohumeral joint injection at that relief was short-lived.  She describes both palmar and dorsal paresthesias.  No right-sided symptoms only left-sided symptoms.  She can only take Tylenol because of a history of gastric bypass.  Describes occasional popping in the shoulder.  C-spine MRI is reviewed with the patient.  It shows bilateral foraminal narrowing at C3-4 which could affect either C4 nerve root.  Also at C4-5 left-sided predominant facet arthritis with left foraminal narrowing that could affect the left C5 nerve root.  These is a prominent left-sided findings..                ROS: All systems reviewed are negative as they relate to the chief complaint within the history of present illness.  Patient denies fevers or chills.  Assessment & Plan: Visit Diagnoses:  1. Radiculopathy, cervical region     Plan: Impression is neck and arm pain and shoulder pain with fairly minimal relief from glenohumeral joint injection but definite left-sided pathology on cervical spine MRI.  Cervical spine ESI for diagnostic and therapeutic purposes indicated with Dr. Alvester Morin.  Will see how she does with that intervention.  Anticipate better relief from the neck injection as opposed  to the shoulder.  Follow-Up Instructions: No follow-ups on file.   Orders:  No orders of the defined types were placed in this encounter.  No orders of the defined types were placed in this encounter.     Procedures: No procedures performed   Clinical Data: No additional findings.  Objective: Vital Signs: LMP 11/11/2010   Physical Exam:  Constitutional: Patient appears well-developed HEENT:  Head: Normocephalic Eyes:EOM are normal Neck: Normal range of motion Cardiovascular: Normal rate Pulmonary/chest: Effort normal Neurologic: Patient is alert Skin: Skin is warm Psychiatric: Patient has normal mood and affect  Ortho Exam: Ortho exam demonstrates range of motion on the left shoulder of 45/100/170.  Rotator cuff strength is intact infraspinatus supraspinatus and subscap muscle testing.  Cervical spine range of motion pretty reasonable with flexion extension and rotation.  Reflexes symmetric 0 to 1+ out of 4 bilateral biceps and triceps.  Good grip strength EPL FPL interosseous wrist flexion extension bicep triceps and deltoid strength bilaterally at 5 out of 5.  Specialty Comments:  No specialty comments available.  Imaging: No results found.   PMFS History: Patient Active Problem List   Diagnosis Date Noted   Synovitis of left shoulder 11/07/2022   Degenerative superior labral anterior-to-posterior (SLAP) tear of left shoulder 11/07/2022   Arthritis of left acromioclavicular joint 11/07/2022   Biceps tendinitis of left upper extremity 11/07/2022  Aortic atherosclerosis (HCC) 08/14/2022   Status post left knee replacement 06/01/2021   Status post total left knee replacement 05/31/2021   Presence of left artificial knee joint 05/31/2021   Unilateral primary osteoarthritis, left knee 12/21/2020   S/P gastric bypass 08/01/2018   Morbid obesity with body mass index (BMI) of 45.0 to 49.9 in adult [facility]) 07/06/2018   Type 2 diabetes mellitus without complication,  without long-term current use of insulin (HCC) 05/22/2017   Class 3 obesity with serious comorbidity and body mass index (BMI) of 40.0 to 44.9 in adult 05/21/2017   Family history of colon cancer 04/01/2017   Irritable bowel syndrome with diarrhea 04/01/2017   NSAID long-term use 04/01/2017   Lymphocytosis 02/24/2017   Essential hypertension 02/12/2017   Mild intermittent asthma with acute exacerbation 12/12/2016   Left shoulder pain 06/02/2015   Insomnia 05/12/2014   Fibromyalgia 05/12/2014   Allergic rhinitis 03/28/2014   Osteoarthritis 12/20/2013   Primary osteoarthritis of both knees 12/20/2013   Cervical strain 06/21/2013   Low back pain 06/21/2013   Metatarsalgia of both feet 03/11/2013   Sciatica 01/25/2013   Restless leg syndrome 11/24/2012   Pure hypercholesterolemia 10/20/2012   OSA on CPAP 10/20/2012   Depression 10/20/2012   Iron deficiency anemia, unspecified 10/01/2012   Snoring 10/01/2012   Diabetes mellitus, type 2 (HCC) 12/31/2010   ANAL FISSURE 12/31/2010   Morbid obesity (HCC) 01/08/2007   CONSTIPATION 01/08/2007   Past Medical History:  Diagnosis Date   Allergic rhinitis, cause unspecified    Anemia    Arthritis    Asthma    Back pain    Chest pain    Constipation    CTS (carpal tunnel syndrome)    Cystocele    Depression    Diabetes mellitus    Dysfunction of eustachian tube    Essential hypertension 02/12/2017   Family history of adverse reaction to anesthesia    daughter hard to wake up   Fatty liver    Fibromyalgia    Fissure, anal    Gallbladder problem    Genital herpes    GERD (gastroesophageal reflux disease)    Headache    Hemorrhoid    Hx of migraine headaches    Hyperlipidemia    IBS (irritable bowel syndrome)    Insomnia    Iron deficiency anemia, unspecified    Joint pain    Nausea    Obesity    OSA on CPAP    Osteoarthritis    Rectocele    Sleep apnea    lost weight, does not use CPAP   TMJ (dislocation of  temporomandibular joint)     Family History  Problem Relation Age of Onset   Hypertension Mother 11   Osteoarthritis Mother    Diabetes Mother    Colon cancer Mother 47   Cancer Mother 77       colon cancer   Hyperlipidemia Mother    Obesity Mother    Sudden death Mother    Arthritis Mother    Colon polyps Brother        x 2 brother   Cirrhosis Brother        alcoholism   Alcohol abuse Brother    Sleep apnea Brother    Hyperlipidemia Brother    Migraines Brother    Obstructive Sleep Apnea Brother    Colon cancer Paternal Aunt    Stroke Maternal Grandmother    Colon cancer Paternal Grandmother    Cancer Paternal Grandmother  colon cancer   Diabetes Daughter    Hypertension Daughter    Sleep apnea Daughter    Mental illness Daughter        depression   Migraines Daughter    Migraines Daughter    Mental illness Daughter        anxiety attacks   Protein S deficiency Daughter    Esophageal cancer Neg Hx    Stomach cancer Neg Hx    Pancreatic cancer Neg Hx     Past Surgical History:  Procedure Laterality Date   ABDOMINAL HYSTERECTOMY  11/11/2010   Senaida Ores.  Ovaries intact.  Uterine fibroids with DUB.   BICEPT TENODESIS Left 10/29/2022   Procedure: BICEPS TENODESIS;  Surgeon: Cammy Copa, MD;  Location: [facility] OR;  Service: Orthopedics;  Laterality: Left;   CARPAL TUNNEL RELEASE  11/12/1987   Left   CHOLECYSTECTOMY     CHOLECYSTECTOMY     COLONOSCOPY  06/29/2012   normal.  Repeat in 5 years.   DILATION AND CURETTAGE OF UTERUS     ECTOPIC PREGNANCY SURGERY  11/12/1991   ESOPHAGOGASTRODUODENOSCOPY  12/13/2011   dysphagia.  Marina Goodell.  Normal.   GASTRIC ROUX-EN-Y N/A 07/06/2018   Procedure: LAPAROSCOPIC ROUX-EN-Y GASTRIC BYPASS WITH UPPER ENDOSCOPY;  Surgeon: Glenna Fellows, MD;  Location: [facility] ORS;  Service: General;  Laterality: N/A;   SHOULDER ARTHROSCOPY WITH OPEN ROTATOR CUFF REPAIR AND DISTAL CLAVICLE ACROMINECTOMY Left 10/29/2022   Procedure: LEFT  SHOULDER ACROMIOCLAVICULAR RESECTION, DEBRIDEMENT;  Surgeon: Cammy Copa, MD;  Location: [facility] OR;  Service: Orthopedics;  Laterality: Left;   Sleep study  09/11/2012   severe OSA.  CPAP titration at 12 cm water pressure.   TOTAL KNEE ARTHROPLASTY Left 05/31/2021   Procedure: LEFT TOTAL KNEE ARTHROPLASTY;  Surgeon: Kathryne Hitch, MD;  Location: [facility] OR;  Service: Orthopedics;  Laterality: Left;   TUBAL LIGATION  11/11/1986   Social History   Occupational History   Occupation: Electrical engineer: [employer]    Comment: cma/[employer]  Tobacco Use   Smoking status: Former    Types: Cigarettes   Smokeless tobacco: Never  Vaping Use   Vaping status: Never Used  Substance and Sexual Activity   Alcohol use: No    Alcohol/week: 1.0 standard drink of alcohol    Types: 1 Glasses of wine per week   Drug use: No   Sexual activity: Not Currently    Birth control/protection: None

## 2023-07-23 ENCOUNTER — Other Ambulatory Visit: Payer: Self-pay

## 2023-07-23 ENCOUNTER — Other Ambulatory Visit (HOSPITAL_COMMUNITY): Payer: Self-pay

## 2023-07-23 ENCOUNTER — Telehealth: Payer: Self-pay | Admitting: Pharmacist

## 2023-07-23 LAB — HEPATIC FUNCTION PANEL
ALT: 32 IU/L (ref 0–32)
AST: 20 IU/L (ref 0–40)
Albumin: 3.9 g/dL (ref 3.8–4.9)
Alkaline Phosphatase: 84 IU/L (ref 44–121)
Bilirubin Total: 0.3 mg/dL (ref 0.0–1.2)
Bilirubin, Direct: 0.12 mg/dL (ref 0.00–0.40)
Total Protein: 6.3 g/dL (ref 6.0–8.5)

## 2023-07-23 LAB — LIPID PANEL
Chol/HDL Ratio: 2.9 ratio (ref 0.0–4.4)
Cholesterol, Total: 184 mg/dL (ref 100–199)
HDL: 64 mg/dL (ref >39)
LDL Chol Calc (NIH): 98 mg/dL (ref 0–99)
Triglycerides: 123 mg/dL (ref 0–149)
VLDL Cholesterol Cal: 22 mg/dL (ref 5–40)

## 2023-07-23 MED ORDER — EZETIMIBE 10 MG PO TABS
10.0000 mg | ORAL_TABLET | Freq: Every day | ORAL | 3 refills | Status: DC
  Filled 2023-07-23: qty 90, 90d supply, fill #0
  Filled 2023-10-13: qty 90, 90d supply, fill #1
  Filled 2024-01-28: qty 90, 90d supply, fill #2
  Filled 2024-04-12: qty 90, 90d supply, fill #3

## 2023-07-23 NOTE — Telephone Encounter (Signed)
Spoke with pt regarding lipid panel results. She asks how she can lower her LDL further. She is adherent to her Repatha. Is intolerant to 3 prior statins - simvastatin, atorvastatin, and rosuvastatin - myalgias as well as LFT elevation on rosuvastatin. She is agreeable to add Zetia and will continue on Repatha. She'll let us know when she'd like labs checked near the end of the year.

## 2023-07-25 ENCOUNTER — Other Ambulatory Visit: Payer: Self-pay

## 2023-07-25 ENCOUNTER — Other Ambulatory Visit (HOSPITAL_COMMUNITY): Payer: Self-pay

## 2023-07-25 DIAGNOSIS — M542 Cervicalgia: Secondary | ICD-10-CM

## 2023-07-28 ENCOUNTER — Other Ambulatory Visit (HOSPITAL_COMMUNITY): Payer: Self-pay

## 2023-08-05 ENCOUNTER — Ambulatory Visit: Payer: Commercial Managed Care - PPO

## 2023-08-11 ENCOUNTER — Telehealth: Payer: Self-pay | Admitting: Physical Medicine and Rehabilitation

## 2023-08-11 NOTE — Telephone Encounter (Signed)
Spoke with patient and cancelled appointment 

## 2023-08-11 NOTE — Telephone Encounter (Signed)
Patient called and ask to cancel the appointment. CB#[phone]

## 2023-08-14 ENCOUNTER — Encounter: Payer: Commercial Managed Care - PPO | Admitting: Physical Medicine and Rehabilitation

## 2023-08-21 ENCOUNTER — Ambulatory Visit: Payer: Commercial Managed Care - PPO | Attending: Physical Medicine & Rehabilitation

## 2023-08-21 ENCOUNTER — Telehealth: Payer: Self-pay

## 2023-08-21 NOTE — Telephone Encounter (Signed)
PT called and left voicemail message regarding missed appointment today.  She has not been in to see [name] in a few weeks since 07/02/23 and has cancelled multiple appointments. PT mentioned on voicemail that will be discharge her at this time and if she would like to come back she would need a new referral.  Eloy End   08/21/23 3:50 PM

## 2023-08-25 ENCOUNTER — Other Ambulatory Visit: Payer: Self-pay

## 2023-08-25 ENCOUNTER — Other Ambulatory Visit (HOSPITAL_COMMUNITY): Payer: Self-pay

## 2023-09-10 ENCOUNTER — Other Ambulatory Visit (INDEPENDENT_AMBULATORY_CARE_PROVIDER_SITE_OTHER): Payer: Self-pay

## 2023-09-10 ENCOUNTER — Ambulatory Visit (INDEPENDENT_AMBULATORY_CARE_PROVIDER_SITE_OTHER): Payer: Commercial Managed Care - PPO | Admitting: Orthopedic Surgery

## 2023-09-10 DIAGNOSIS — M5416 Radiculopathy, lumbar region: Secondary | ICD-10-CM

## 2023-09-10 NOTE — Progress Notes (Signed)
Orthopedic Spine Surgery Office Note   Assessment: Patient is a 62 y.o. female with low back pain that radiates into her bilateral lateral hips (left >right).  Has L4/5 spondylolisthesis.  Suspect radiculopathy     Plan: -Patient has tried activity modification, facet injections, Tylenol, PT, core strengthening, gabapentin, lyrica -After going over her options again, she wanted to try calling pain management to get back into their office and start treatment -If she does want to do surgery or injections in the future, would want get an MRI to evaluate for radiculopathy -Patient should return to office in 6 months, x-rays at next visit: AP/lateral/flex/ex lumbar     Patient expressed understanding of the plan and all questions were answered to the patient's satisfaction.    ___________________________________________________________________________   History: Patient is a 62 y.o. female who has been previously seen in the office for low back pain that radiates into the bilateral lateral hips. After our last visit, she wanted to try pain management. She was given a referral and did the required drug testing but then never started any treatment. She said that no one ever reached back out with her to start her treatment. Her pain is the same as it was at our last visit. She has not developed any new symptoms.    Previous treatments: activity modification, facet injections, Tylenol, PT, core strengthening, gabapentin, lyrica     Physical Exam:   General: no acute distress, appears stated age Neurologic: alert, answering questions appropriately, following commands Respiratory: unlabored breathing on room air, symmetric chest rise Psychiatric: appropriate affect, normal cadence to speech     MSK (spine):   -Strength exam                                                   Left                  Right EHL                              5/5                  5/5 TA                                  5/5                  5/5 GSC                             5/5                  5/5 Knee extension            5/5                  5/5 Hip flexion                    5/5                  5/5   -Sensory exam  Sensation intact to light touch in L3-S1 nerve distributions of bilateral lower extremities   -Achilles DTR: 0/4 on the left, 1/4 on the right -Patellar tendon DTR: 1/4 on the left, 1/4 on the right   -Straight leg raise: negative bilaterally  -Clonus: no beats bilaterally   Imaging: XRs of the lumbar spine from 09/10/2023 were independently reviewed and interpreted, showing spondylolisthesis at L4/5 that shifts 1 mm between flexion and extension views. There is disc height loss at that level. No other significant degenerative changes seen. No fracture or dislocation seen.      Patient name: Cristina [name] Patient MRN: [id number] Date of visit: 09/10/23

## 2023-09-17 ENCOUNTER — Other Ambulatory Visit (HOSPITAL_COMMUNITY): Payer: Self-pay

## 2023-09-18 ENCOUNTER — Other Ambulatory Visit (HOSPITAL_COMMUNITY): Payer: Self-pay

## 2023-09-18 MED ORDER — BUPROPION HCL 100 MG PO TABS
100.0000 mg | ORAL_TABLET | Freq: Two times a day (BID) | ORAL | 4 refills | Status: AC
  Filled 2023-09-18: qty 180, 90d supply, fill #0
  Filled 2023-12-19: qty 180, 90d supply, fill #1
  Filled 2024-03-19: qty 180, 90d supply, fill #2
  Filled 2024-06-30: qty 180, 90d supply, fill #3

## 2023-09-19 ENCOUNTER — Other Ambulatory Visit (HOSPITAL_COMMUNITY): Payer: Self-pay

## 2023-09-19 DIAGNOSIS — E119 Type 2 diabetes mellitus without complications: Secondary | ICD-10-CM | POA: Diagnosis not present

## 2023-09-19 DIAGNOSIS — K219 Gastro-esophageal reflux disease without esophagitis: Secondary | ICD-10-CM | POA: Diagnosis not present

## 2023-09-19 DIAGNOSIS — Z23 Encounter for immunization: Secondary | ICD-10-CM | POA: Diagnosis not present

## 2023-09-19 DIAGNOSIS — F331 Major depressive disorder, recurrent, moderate: Secondary | ICD-10-CM | POA: Diagnosis not present

## 2023-09-19 DIAGNOSIS — Z9884 Bariatric surgery status: Secondary | ICD-10-CM | POA: Diagnosis not present

## 2023-09-19 MED ORDER — ESOMEPRAZOLE MAGNESIUM 40 MG PO CPDR
40.0000 mg | DELAYED_RELEASE_CAPSULE | Freq: Every day | ORAL | 4 refills | Status: AC
  Filled 2023-09-19 – 2023-11-10 (×2): qty 90, 90d supply, fill #0
  Filled 2024-02-03: qty 90, 90d supply, fill #1
  Filled 2024-04-29: qty 90, 90d supply, fill #2

## 2023-09-19 MED ORDER — ESCITALOPRAM OXALATE 20 MG PO TABS
20.0000 mg | ORAL_TABLET | Freq: Every day | ORAL | 3 refills | Status: AC
  Filled 2023-09-19 – 2023-11-10 (×2): qty 90, 90d supply, fill #0
  Filled 2024-02-20: qty 90, 90d supply, fill #1
  Filled 2024-04-12 – 2024-05-03 (×3): qty 90, 90d supply, fill #2

## 2023-09-21 ENCOUNTER — Other Ambulatory Visit (HOSPITAL_COMMUNITY): Payer: Self-pay

## 2023-09-22 ENCOUNTER — Other Ambulatory Visit: Payer: Self-pay

## 2023-09-23 ENCOUNTER — Other Ambulatory Visit (HOSPITAL_COMMUNITY): Payer: Self-pay

## 2023-09-23 MED ORDER — HYDROCORTISONE 2.5 % EX CREA
1.0000 | TOPICAL_CREAM | Freq: Two times a day (BID) | CUTANEOUS | 0 refills | Status: AC
  Filled 2023-09-23: qty 30, 15d supply, fill #0

## 2023-09-29 ENCOUNTER — Other Ambulatory Visit: Payer: Self-pay

## 2023-09-29 ENCOUNTER — Other Ambulatory Visit (HOSPITAL_COMMUNITY): Payer: Self-pay

## 2023-10-03 ENCOUNTER — Encounter (HOSPITAL_BASED_OUTPATIENT_CLINIC_OR_DEPARTMENT_OTHER): Payer: Self-pay | Admitting: Family

## 2023-10-03 ENCOUNTER — Other Ambulatory Visit: Payer: Self-pay

## 2023-10-03 ENCOUNTER — Ambulatory Visit (HOSPITAL_BASED_OUTPATIENT_CLINIC_OR_DEPARTMENT_OTHER): Payer: Commercial Managed Care - PPO | Admitting: Family

## 2023-10-03 ENCOUNTER — Ambulatory Visit (HOSPITAL_BASED_OUTPATIENT_CLINIC_OR_DEPARTMENT_OTHER): Payer: Commercial Managed Care - PPO | Admitting: Cardiology

## 2023-10-03 VITALS — BP 110/72 | HR 84 | Ht 61.0 in | Wt 154.4 lb

## 2023-10-03 DIAGNOSIS — E785 Hyperlipidemia, unspecified: Secondary | ICD-10-CM

## 2023-10-03 DIAGNOSIS — I10 Essential (primary) hypertension: Secondary | ICD-10-CM

## 2023-10-03 DIAGNOSIS — I25118 Atherosclerotic heart disease of native coronary artery with other forms of angina pectoris: Secondary | ICD-10-CM

## 2023-10-03 DIAGNOSIS — I700 Atherosclerosis of aorta: Secondary | ICD-10-CM | POA: Diagnosis not present

## 2023-10-03 DIAGNOSIS — Z789 Other specified health status: Secondary | ICD-10-CM

## 2023-10-03 DIAGNOSIS — E1169 Type 2 diabetes mellitus with other specified complication: Secondary | ICD-10-CM

## 2023-10-03 NOTE — Progress Notes (Signed)
Cardiology Office Note:  .   Date:  10/03/2023  ID:  Cristina [name], DOB [birth year], MRN [id number] PCP: Ethelda Chick, MD  [facility] Providers Cardiologist:  None Cardiology APP:  Alver Sorrow, NP Alver Sorrow, NP    History of Present Illness: .   Cristina [name] is a 62 y.o. female with history of coronary artery disease, hyperlipidemia, DM2, HTN, obesity, OSA on CPAP, asthma, fibromyalgia. Previous patient of Dr. Elease Hashimoto, Trinda Pascal, and [facility]ayer.  TTE 2017 normal LVEF, gr1dd, no significant valvular disease. Myoview 2018 negative for ischemia. At visit 01/2022 with Dr. Shari Prows was referred to PharmD team for GLP1 and [facility] initiated. Subsequent coronary calcium score 01/2022 revealed calcium score of 334 placing her in 99th percentil for age/gender/race as well as aortic atherosclerosis. She has also been started on Repatha and Zetia by pharmacy team due to intolerance to statin (Atorvastatin, Simvastatin, Rosuvastatin)  Presents today for follow up independently. Feeling well since last seen. Reports no shortness of breath nor dyspnea on exertion. Reports no chest pain, pressure, or tightness. No edema, orthopnea, PND. Reports no palpitations.  No formal exercise routine. Notes not eating much with Mounjaro, discussed ways to intake protein and fiber intake to continue weight loss.   ROS: Please see the history of present illness.    All other systems reviewed and are negative.   Studies Reviewed: [name]   EKG Interpretation Date/Time:  Friday October 03 2023 15:43:57 EST Ventricular Rate:  82 PR Interval:  176 QRS Duration:  88 QT Interval:  404 QTC Calculation: 472 R Axis:   -39  Text Interpretation: Normal sinus rhythm Left axis deviation When compared with ECG of 29-May-2010 03:45, No significant change was found Confirmed by Gillian Shields ([id number]) on 10/03/2023 3:49:48 PM    Cardiac Studies & Procedures     STRESS TESTS  MYOCARDIAL PERFUSION IMAGING  11/13/2016  Narrative  Nuclear stress EF: 69%.  There was no ST segment deviation noted during stress.  The study is normal.  The left ventricular ejection fraction is hyperdynamic (>65%).  Normal study, no evidence for ischemia or infarction.   ECHOCARDIOGRAM  ECHOCARDIOGRAM COMPLETE 11/08/2016  Narrative *[facility]* [address] [place], [place] [address] [phone]  ------------------------------------------------------------------- Transthoracic Echocardiography  Patient:    Cristina, [name] MR #:       [id number] Study Date: 11/08/2016 Gender:     F Age:        21 Height:     162.6 cm Weight:     101.6 kg BSA:        2.19 m^2 Pt. Status: Room:  REFERRING    Kristeen Miss, M.D. SONOGRAPHER  Aida Raider, RDCS PERFORMING   [facility], Outpatient ATTENDING    Nahser, Ali Lowe     Nahser, Jr REFERRING    Nahser, Jr  cc:  ------------------------------------------------------------------- LV EF: 60% -   65%  ------------------------------------------------------------------- Indications:      Chest pain (R07.9).  ------------------------------------------------------------------- History:   Risk factors:  OSA on CPAP. Former tobacco use. Diabetes mellitus. Dyslipidemia.  ------------------------------------------------------------------- Study Conclusions  - Procedure narrative: Transthoracic echocardiography. Image quality was suboptimal. Intravenous contrast (Definity) was administered. - Left ventricle: The cavity size was normal. There was mild concentric hypertrophy. Systolic function was normal. The estimated ejection fraction was in the range of 60% to 65%. Wall motion was normal; there were no regional wall motion abnormalities. Doppler parameters are consistent with abnormal left ventricular relaxation (grade 1  diastolic dysfunction).  ------------------------------------------------------------------- Study data:  No prior  study was available for comparison.  Study status:  Routine.  Procedure:  The patient reported no pain pre or post test. Transthoracic echocardiography. Image quality was suboptimal. Intravenous contrast (Definity) was administered. Study completion:  There were no complications. Transthoracic echocardiography.  M-mode, complete 2D, spectral Doppler, and color Doppler.  Birthdate:  Patient birthdate: Nov 01, 1961.  Age:  Patient is 62 yr old.  Sex:  Gender: female. BMI: 38.4 kg/m^2.  Blood pressure:     128/92  Patient status: Outpatient.  Study date:  Study date: 11/08/2016. Study time: 03:51 PM.  Location:  [facility]  -------------------------------------------------------------------  ------------------------------------------------------------------- Left ventricle:  The cavity size was normal. There was mild concentric hypertrophy. Systolic function was normal. The estimated ejection fraction was in the range of 60% to 65%. Wall motion was normal; there were no regional wall motion abnormalities. Doppler parameters are consistent with abnormal left ventricular relaxation (grade 1 diastolic dysfunction). There was no evidence of elevated ventricular filling pressure by Doppler parameters.  ------------------------------------------------------------------- Aortic valve:   Structurally normal valve. Trileaflet. Cusp separation was normal.  Doppler:  Transvalvular velocity was within the normal range. There was no stenosis. There was no regurgitation.  ------------------------------------------------------------------- Aorta:  Aortic root: The aortic root was normal in size. Ascending aorta: The ascending aorta was normal in size.  ------------------------------------------------------------------- Mitral valve:   Structurally normal valve.   Leaflet separation was normal.  Doppler:  Transvalvular velocity was within the normal range. There was no evidence for stenosis.  There was no regurgitation.    Peak gradient (D): 3 mm Hg.  ------------------------------------------------------------------- Left atrium:  The atrium was normal in size.  ------------------------------------------------------------------- Right ventricle:  The cavity size was normal. Systolic function was normal.  ------------------------------------------------------------------- Pulmonic valve:    Structurally normal valve.   Cusp separation was normal.  Doppler:  Transvalvular velocity was within the normal range. There was no regurgitation.  ------------------------------------------------------------------- Tricuspid valve:   Structurally normal valve.   Leaflet separation was normal.  Doppler:  Transvalvular velocity was within the normal range. There was no regurgitation.  ------------------------------------------------------------------- Pulmonary artery:    Systolic pressure could not be accurately estimated.  ------------------------------------------------------------------- Right atrium:  The atrium was normal in size.  ------------------------------------------------------------------- Pericardium:  There was no pericardial effusion.  ------------------------------------------------------------------- Systemic veins: Inferior vena cava: The vessel was normal in size. The respirophasic diameter changes were in the normal range (>= 50%), consistent with normal central venous pressure.  ------------------------------------------------------------------- Measurements  Left ventricle                         Value        Reference LV ID, ED, PLAX chordal                44.13 mm     43 - 52 LV ID, ES, PLAX chordal                24.12 mm     23 - 38 LV fx shortening, PLAX chordal         45    %      >=29 LV PW thickness, ED                    12.3  mm     --------- IVS/LV PW ratio, ED  1.02         <=1.3 Stroke volume, 2D                       55    ml     --------- Stroke volume/bsa, 2D                  25    ml/m^2 --------- LV e&', lateral                         12.2  cm/s   --------- LV E/e&', lateral                       7            --------- LV e&', medial                          9.25  cm/s   --------- LV E/e&', medial                        9.23         --------- LV e&', average                         10.73 cm/s   --------- LV E/e&', average                       7.96         ---------  Ventricular septum                     Value        Reference IVS thickness, ED                      12.5  mm     ---------  LVOT                                   Value        Reference LVOT ID, S                             20    mm     --------- LVOT area                              3.14  cm^2   --------- LVOT peak velocity, S                  108   cm/s   --------- LVOT mean velocity, S                  87.9  cm/s   --------- LVOT VTI, S                            17.4  cm     ---------  Aorta                                  Value  Reference Aortic root ID, ED                     34    mm     --------- Ascending aorta ID, A-P, S             31    mm     ---------  Left atrium                            Value        Reference LA ID, A-P, ES                         34    mm     --------- LA ID/bsa, A-P                         1.55  cm/m^2 <=2.2 LA volume, S                           19    ml     --------- LA volume/bsa, S                       8.7   ml/m^2 --------- LA volume, ES, 1-p A4C                 13.8  ml     --------- LA volume/bsa, ES, 1-p A4C             6.3   ml/m^2 --------- LA volume, ES, 1-p A2C                 25.1  ml     --------- LA volume/bsa, ES, 1-p A2C             11.5  ml/m^2 ---------  Mitral valve                           Value        Reference Mitral E-wave peak velocity            85.4  cm/s   --------- Mitral A-wave peak velocity            106   cm/s   --------- Mitral deceleration  time       (H)     303   ms     150 - 230 Mitral peak gradient, D                3     mm Hg  --------- Mitral E/A ratio, peak                 0.8          ---------  Systemic veins                         Value        Reference Estimated CVP                          3     mm Hg  ---------  Right ventricle  Value        Reference TAPSE                                  13.9  mm     --------- RV s&', lateral, S                      13.4  cm/s   ---------  Legend: (L)  and  (H)  mark values outside specified reference range.  ------------------------------------------------------------------- Prepared and Electronically Authenticated by  Thurmon Fair, MD 2017-12-29T17:35:06     CT SCANS  CT CARDIAC SCORING (SELF PAY ONLY) 01/30/2022  Addendum 01/30/2022  3:22 PM ADDENDUM REPORT: 01/30/2022 15:20  CLINICAL DATA:  Risk stratification: 62 yo American [place] Female  EXAM: Coronary Calcium Score  TECHNIQUE: The patient was scanned on a [employer]. Axial non-contrast 3 mm slices were carried out through the heart. The data set was analyzed on a dedicated work station and scored using the Agatson method.  FINDINGS: Non-cardiac: See separate report from [facility].  Ascending Aorta: Normal caliber.  Aortic atherosclerosis.  Pericardium: Normal.  Coronary arteries: Normal origins.  Coronary Calcium Score:  Left main: 0  Left anterior descending artery: 181  Left circumflex artery: 86  Right coronary artery: 67  Total: 334  Percentile: 99th for age, sex, and race matched control.  IMPRESSION: 1. Coronary calcium score of 334. This was 99th percentile for age, gender, and race matched controls.  2. Aortic atherosclerosis.  RECOMMENDATIONS:  Coronary artery calcium (CAC) score is a strong predictor of incident coronary heart disease (CHD) and provides predictive information beyond traditional risk factors. CAC scoring  is reasonable to use in the decision to withhold, postpone, or initiate statin therapy in intermediate-risk or selected borderline-risk asymptomatic adults (age 9-75 years and LDL-C >=70 to <190 mg/dL) who do not have diabetes or established atherosclerotic cardiovascular disease (ASCVD).* In intermediate-risk (10-year ASCVD risk >=7.5% to <20%) adults or selected borderline-risk (10-year ASCVD risk >=5% to <7.5%) adults in whom a CAC score is measured for the purpose of making a treatment decision the following recommendations have been made:  If CAC = 0, it is reasonable to withhold statin therapy and reassess in 5 to 10 years, as long as higher risk conditions are absent (diabetes mellitus, family history of premature CHD in first degree relatives (males <55 years; females <65 years), cigarette smoking, LDL >=190 mg/dL or other independent risk factors).  If CAC is 1 to 99, it is reasonable to initiate statin therapy for patients >=69 years of age.  If CAC is >=100 or >=75th percentile, it is reasonable to initiate statin therapy at any age.  Cardiology referral should be considered for patients with CAC scores =400 or >=75th percentile.  *2018 AHA/ACC/AACVPR/AAPA/ABC/ACPM/ADA/AGS/APhA/ASPC/NLA/PCNA Guideline on the Management of Blood Cholesterol: A Report of the American College of Cardiology/American Heart Association Task Force on Clinical Practice Guidelines. J Am Coll Cardiol. 2019;73(24):3168-3209.  Riley Lam, MD   Electronically Signed By: Riley Lam M.D. On: 01/30/2022 15:20  Narrative EXAM: OVER-READ INTERPRETATION  CT CHEST  The following report is an over-read performed by radiologist Dr. Trudie Reed of [facility] on 01/30/2022. This over-read does not include interpretation of cardiac or coronary anatomy or pathology. The coronary calcium score interpretation by the cardiologist is attached.  COMPARISON:   None.  FINDINGS: Atherosclerotic calcifications in the thoracic aorta. Within  the visualized portions of the thorax there are no suspicious appearing pulmonary nodules or masses, there is no acute consolidative airspace disease, no pleural effusions, no pneumothorax and no lymphadenopathy. Visualized portions of the upper abdomen are unremarkable. There are no aggressive appearing lytic or blastic lesions noted in the visualized portions of the skeleton.  IMPRESSION: 1.  Aortic Atherosclerosis (ICD10-I70.0).  Electronically Signed: By: Trudie Reed M.D. On: 01/30/2022 08:29          Risk Assessment/Calculations:             Physical Exam:   VS:  BP 110/72   Pulse 84   Ht 5\' 1"  (1.549 m)   Wt 154 lb 6.4 oz (70 kg)   LMP 11/11/2010   SpO2 98%   BMI 29.17 kg/m    Wt Readings from Last 3 Encounters:  10/03/23 154 lb 6.4 oz (70 kg)  04/01/23 150 lb (68 kg)  03/31/23 157 lb (71.2 kg)    GEN: Well nourished, well developed in no acute distress NECK: No JVD; No carotid bruits CARDIAC: RRR, no murmurs, rubs, gallops RESPIRATORY:  Clear to auscultation without rales, wheezing or rhonchi  ABDOMEN: Soft, non-tender, non-distended EXTREMITIES:  No edema; No deformity   ASSESSMENT AND PLAN: .    HTN - Weight loss via diet and exercise encouraged. Discussed the impact being overweight would have on cardiovascular risk. Discussed to monitor BP at home at least 2 hours after medications and sitting for 5-10 minutes.  Coronary artery disease / Statin intolerance / Aortic atherosclerosis / HLD, LDL goal <70 - 01/2022 calcium score of 334 placing her in 99th percentil for age/gender/race as well as aortic atherosclerosis. Stable with no anginal symptoms. No indication for ischemic evaluation. Prior statin intolerance. 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  GDMT Repatha, Zetia. Update lipid panel in December.  DM2 / Obesity - Prior gastric bypass. Congratulated on weight loss with  Mounjaro. Has lost 25 lbs over 8 months. Discussed Right Start exercise program. Weight loss via diet and exercise encouraged. Discussed the impact being overweight would have on cardiovascular risk.        Dispo: follow up in 1 year  Signed, Alver Sorrow, NP

## 2023-10-03 NOTE — Patient Instructions (Signed)
Medication Instructions:  Continue your current medications.   *If you need a refill on your cardiac medications before your next appointment, please call your pharmacy*   Lab Work: Fasting labs in December at your convenience.   Testing/Procedures: EKG looks great!  Follow-Up: At [facility], you and your health needs are our priority.  As part of our continuing mission to provide you with exceptional heart care, we have created designated Provider Care Teams.  These Care Teams include your primary Cardiologist (physician) and Advanced Practice Providers (APPs -  Physician Assistants and Nurse Practitioners) who all work together to provide you with the care you need, when you need it.  We recommend signing up for the patient portal called "MyChart".  Sign up information is provided on this After Visit Summary.  MyChart is used to connect with patients for Virtual Visits (Telemedicine).  Patients are able to view lab/test results, encounter notes, upcoming appointments, etc.  Non-urgent messages can be sent to your provider as well.   To learn more about what you can do with MyChart, go to [email or web address].    Your next appointment:   1 year(s)  Provider:   Gillian Shields, NP    Other Instructions  Heart Healthy Diet Recommendations: A low-salt diet is recommended. Meats should be grilled, baked, or boiled. Avoid fried foods. Focus on lean protein sources like fish or chicken with vegetables and fruits. The American Heart Association is a Chief Technology Officer!  American Heart Association Diet and Lifeystyle Recommendations   Exercise recommendations: The American Heart Association recommends 150 minutes of moderate intensity exercise weekly. Try 30 minutes of moderate intensity exercise 4-5 times per week. This could include walking, jogging, or swimming.

## 2023-10-17 ENCOUNTER — Other Ambulatory Visit: Payer: Self-pay

## 2023-10-17 ENCOUNTER — Other Ambulatory Visit (HOSPITAL_COMMUNITY): Payer: Self-pay

## 2023-11-07 DIAGNOSIS — E785 Hyperlipidemia, unspecified: Secondary | ICD-10-CM | POA: Diagnosis not present

## 2023-11-08 LAB — LIPID PANEL
Chol/HDL Ratio: 2.3 {ratio} (ref 0.0–4.4)
Cholesterol, Total: 137 mg/dL (ref 100–199)
HDL: 60 mg/dL (ref >39)
LDL Chol Calc (NIH): 60 mg/dL (ref 0–99)
Triglycerides: 93 mg/dL (ref 0–149)
VLDL Cholesterol Cal: 17 mg/dL (ref 5–40)

## 2023-11-08 LAB — COMPREHENSIVE METABOLIC PANEL
ALT: 30 [IU]/L (ref 0–32)
AST: 23 [IU]/L (ref 0–40)
Albumin: 4 g/dL (ref 3.8–4.9)
Alkaline Phosphatase: 84 [IU]/L (ref 44–121)
BUN/Creatinine Ratio: 10 — ABNORMAL LOW (ref 12–28)
BUN: 8 mg/dL (ref 8–27)
Bilirubin Total: 0.5 mg/dL (ref 0.0–1.2)
CO2: 24 mmol/L (ref 20–29)
Calcium: 9.2 mg/dL (ref 8.7–10.3)
Chloride: 106 mmol/L (ref 96–106)
Creatinine, Ser: 0.82 mg/dL (ref 0.57–1.00)
Globulin, Total: 2.6 g/dL (ref 1.5–4.5)
Glucose: 94 mg/dL (ref 70–99)
Potassium: 3.9 mmol/L (ref 3.5–5.2)
Sodium: 144 mmol/L (ref 134–144)
Total Protein: 6.6 g/dL (ref 6.0–8.5)
eGFR: 82 mL/min/{1.73_m2} (ref >59)

## 2023-11-10 ENCOUNTER — Encounter (HOSPITAL_BASED_OUTPATIENT_CLINIC_OR_DEPARTMENT_OTHER): Payer: Self-pay

## 2023-11-10 ENCOUNTER — Other Ambulatory Visit (HOSPITAL_COMMUNITY): Payer: Self-pay

## 2023-11-10 ENCOUNTER — Other Ambulatory Visit: Payer: Self-pay

## 2023-11-11 ENCOUNTER — Telehealth: Payer: Self-pay | Admitting: Orthopedic Surgery

## 2023-11-11 NOTE — Telephone Encounter (Signed)
Unable to leave vm due to being full. Provider will not be in office pt need to reschedule

## 2023-11-13 ENCOUNTER — Other Ambulatory Visit (HOSPITAL_COMMUNITY): Payer: Self-pay

## 2023-11-13 MED ORDER — CLOBETASOL PROPIONATE 0.05 % EX CREA
TOPICAL_CREAM | Freq: Two times a day (BID) | CUTANEOUS | 0 refills | Status: DC
  Filled 2023-11-13: qty 15, 5d supply, fill #0

## 2023-11-14 ENCOUNTER — Ambulatory Visit: Payer: Commercial Managed Care - PPO | Admitting: Surgical

## 2023-11-17 ENCOUNTER — Other Ambulatory Visit (HOSPITAL_COMMUNITY): Payer: Self-pay

## 2023-11-17 MED ORDER — IPRATROPIUM BROMIDE 0.06 % NA SOLN
2.0000 | Freq: Four times a day (QID) | NASAL | 0 refills | Status: AC | PRN
  Filled 2023-11-17: qty 15, 10d supply, fill #0

## 2023-12-01 ENCOUNTER — Other Ambulatory Visit (HOSPITAL_COMMUNITY): Payer: Self-pay

## 2023-12-04 ENCOUNTER — Other Ambulatory Visit (HOSPITAL_COMMUNITY): Payer: Self-pay

## 2023-12-04 MED ORDER — FLUTICASONE PROPIONATE 50 MCG/ACT NA SUSP
2.0000 | Freq: Every day | NASAL | 4 refills | Status: AC
  Filled 2023-12-04: qty 48, 90d supply, fill #0
  Filled 2024-02-03 – 2024-02-20 (×2): qty 48, 90d supply, fill #1
  Filled 2024-04-12 – 2024-05-04 (×4): qty 48, 90d supply, fill #2
  Filled 2024-08-18: qty 48, 90d supply, fill #3
  Filled 2024-11-15: qty 48, 90d supply, fill #4

## 2024-01-06 ENCOUNTER — Other Ambulatory Visit: Payer: Self-pay

## 2024-01-06 ENCOUNTER — Other Ambulatory Visit (HOSPITAL_COMMUNITY): Payer: Self-pay

## 2024-01-06 DIAGNOSIS — H2513 Age-related nuclear cataract, bilateral: Secondary | ICD-10-CM | POA: Diagnosis not present

## 2024-01-06 DIAGNOSIS — H0102A Squamous blepharitis right eye, upper and lower eyelids: Secondary | ICD-10-CM | POA: Diagnosis not present

## 2024-01-06 DIAGNOSIS — H538 Other visual disturbances: Secondary | ICD-10-CM | POA: Diagnosis not present

## 2024-01-06 DIAGNOSIS — H0102B Squamous blepharitis left eye, upper and lower eyelids: Secondary | ICD-10-CM | POA: Diagnosis not present

## 2024-01-06 DIAGNOSIS — E119 Type 2 diabetes mellitus without complications: Secondary | ICD-10-CM | POA: Diagnosis not present

## 2024-01-08 ENCOUNTER — Other Ambulatory Visit (HOSPITAL_COMMUNITY): Payer: Self-pay

## 2024-01-08 ENCOUNTER — Other Ambulatory Visit: Payer: Self-pay

## 2024-01-12 ENCOUNTER — Other Ambulatory Visit (HOSPITAL_COMMUNITY): Payer: Self-pay

## 2024-01-12 MED ORDER — GLUCOSE BLOOD VI STRP
1.0000 | ORAL_STRIP | Freq: Every day | 4 refills | Status: AC
Start: 2024-01-12 — End: ?
  Filled 2024-01-12: qty 100, 90d supply, fill #0

## 2024-01-15 ENCOUNTER — Other Ambulatory Visit (HOSPITAL_COMMUNITY): Payer: Self-pay

## 2024-01-21 ENCOUNTER — Telehealth: Payer: Self-pay | Admitting: Orthopedic Surgery

## 2024-01-21 NOTE — Telephone Encounter (Signed)
 Form completed.

## 2024-01-21 NOTE — Telephone Encounter (Signed)
 Patient is requesting flare-up for Matrix. Requesting 4-5 x per month, 8 hrs per episode. Last seen 09/10/23, next ov 03/10/24. Please advise if okay. Thank you!

## 2024-01-23 ENCOUNTER — Other Ambulatory Visit (HOSPITAL_COMMUNITY): Payer: Self-pay

## 2024-01-23 ENCOUNTER — Other Ambulatory Visit: Payer: Self-pay

## 2024-01-23 DIAGNOSIS — F331 Major depressive disorder, recurrent, moderate: Secondary | ICD-10-CM | POA: Diagnosis not present

## 2024-01-23 DIAGNOSIS — M797 Fibromyalgia: Secondary | ICD-10-CM | POA: Diagnosis not present

## 2024-01-23 DIAGNOSIS — J301 Allergic rhinitis due to pollen: Secondary | ICD-10-CM | POA: Diagnosis not present

## 2024-01-23 DIAGNOSIS — G2581 Restless legs syndrome: Secondary | ICD-10-CM | POA: Diagnosis not present

## 2024-01-23 DIAGNOSIS — H01116 Allergic dermatitis of left eye, unspecified eyelid: Secondary | ICD-10-CM | POA: Diagnosis not present

## 2024-01-23 DIAGNOSIS — J4520 Mild intermittent asthma, uncomplicated: Secondary | ICD-10-CM | POA: Diagnosis not present

## 2024-01-23 DIAGNOSIS — D72820 Lymphocytosis (symptomatic): Secondary | ICD-10-CM | POA: Diagnosis not present

## 2024-01-23 DIAGNOSIS — K644 Residual hemorrhoidal skin tags: Secondary | ICD-10-CM | POA: Diagnosis not present

## 2024-01-23 DIAGNOSIS — A6004 Herpesviral vulvovaginitis: Secondary | ICD-10-CM | POA: Diagnosis not present

## 2024-01-23 DIAGNOSIS — E119 Type 2 diabetes mellitus without complications: Secondary | ICD-10-CM | POA: Diagnosis not present

## 2024-01-23 MED ORDER — GABAPENTIN 300 MG PO CAPS
600.0000 mg | ORAL_CAPSULE | Freq: Every day | ORAL | 4 refills | Status: AC
  Filled 2024-01-23: qty 180, 90d supply, fill #0
  Filled 2024-04-29: qty 180, 90d supply, fill #1
  Filled 2024-07-25: qty 180, 90d supply, fill #2

## 2024-01-23 MED ORDER — HYDROCORTISONE ACETATE 25 MG RE SUPP
25.0000 mg | Freq: Two times a day (BID) | RECTAL | 1 refills | Status: AC | PRN
  Filled 2024-01-23: qty 24, 12d supply, fill #0

## 2024-01-23 MED ORDER — IPRATROPIUM BROMIDE 0.06 % NA SOLN
2.0000 | Freq: Two times a day (BID) | NASAL | 11 refills | Status: AC
  Filled 2024-01-23: qty 15, 30d supply, fill #0
  Filled 2024-02-03: qty 15, 30d supply, fill #1
  Filled 2024-02-20: qty 45, 90d supply, fill #1
  Filled 2024-04-12 – 2024-05-03 (×3): qty 45, 90d supply, fill #2
  Filled 2024-10-27: qty 45, 90d supply, fill #3

## 2024-01-23 MED ORDER — ACYCLOVIR 400 MG PO TABS
400.0000 mg | ORAL_TABLET | Freq: Two times a day (BID) | ORAL | 4 refills | Status: AC
  Filled 2024-01-23 – 2024-04-12 (×2): qty 180, 90d supply, fill #0
  Filled 2024-07-25: qty 180, 90d supply, fill #1
  Filled 2024-10-19: qty 180, 90d supply, fill #2

## 2024-01-28 ENCOUNTER — Other Ambulatory Visit: Payer: Self-pay

## 2024-01-28 ENCOUNTER — Other Ambulatory Visit (HOSPITAL_COMMUNITY): Payer: Self-pay

## 2024-01-28 DIAGNOSIS — D72820 Lymphocytosis (symptomatic): Secondary | ICD-10-CM | POA: Diagnosis not present

## 2024-01-28 DIAGNOSIS — E119 Type 2 diabetes mellitus without complications: Secondary | ICD-10-CM | POA: Diagnosis not present

## 2024-01-29 ENCOUNTER — Other Ambulatory Visit (INDEPENDENT_AMBULATORY_CARE_PROVIDER_SITE_OTHER): Payer: Self-pay

## 2024-01-29 ENCOUNTER — Encounter: Payer: Self-pay | Admitting: Surgical

## 2024-01-29 ENCOUNTER — Ambulatory Visit: Admitting: Surgical

## 2024-01-29 DIAGNOSIS — M79602 Pain in left arm: Secondary | ICD-10-CM

## 2024-01-29 NOTE — Progress Notes (Signed)
 Office Visit Note   Patient: Cristina [name]           Date of Birth: 06-20-1961           MRN: [id number] Visit Date: 01/29/2024 Requested by: Ethelda Chick, MD [address] [place],  [place] [address] PCP: Ethelda Chick, MD  Subjective: Chief Complaint  Patient presents with   Left Upper Arm - Pain    HPI: Cristina [name] is a 63 y.o. female who presents to the office reporting left arm pain.  Patient states that she was handling electrical wire which involved some pulling and manipulating about 2 weeks ago.  She had no actual injury event at the time but the next day she noticed increased pain around the mid humerus region.  She has pain pretty much all the time.  Tylenol not giving her much relief.  She is right-hand dominant.  She feels that her bicep muscle is sore to the touch at times.  She denies any new mechanical symptoms or any change in the appearance.  She cannot take any NSAIDs due to her history of gastric bypass.  No new weakness.  She was getting to the point where she could lay on her left arm at night to sleep but now she is having difficulty laying on that arm again in the last couple weeks..                ROS: All systems reviewed are negative as they relate to the chief complaint within the history of present illness.  Patient denies fevers or chills.  Assessment & Plan: Visit Diagnoses:  1. Left arm pain     Plan: Patient is a 63 year old female who presents for evaluation of left arm pain.  She has new pain over the last 2 weeks without any specific injury event.  Pain came on the following day after working with electrical wire which involves a lot of pulling.  She has no evidence of rupture of the biceps tenodesis on exam today.  It seems more that she has reproducible pain in the mid humerus region with shoulder range of motion and she has radiographs demonstrating slight progression of glenohumeral arthritis compared with prior radiographs.  Think that  this is likely the cause of her symptoms.  Discussed options available to patient such as riding this out to see if that will improve versus glenohumeral injection to offer therapeutic/diagnostic benefit versus physical therapy.  She would like to consider glenohumeral injection in the future if this does not improve and she will give this another 1 to 2 weeks before opting for the injection.  Follow-up as needed if pain does not improve and we will try the injection.  Follow-Up Instructions: No follow-ups on file.   Orders:  Orders Placed This Encounter  Procedures   XR Humerus Left   XR Shoulder Left   No orders of the defined types were placed in this encounter.     Procedures: No procedures performed   Clinical Data: No additional findings.  Objective: Vital Signs: LMP 11/11/2010   Physical Exam:  Constitutional: Patient appears well-developed HEENT:  Head: Normocephalic Eyes:EOM are normal Neck: Normal range of motion Cardiovascular: Normal rate Pulmonary/chest: Effort normal Neurologic: Patient is alert Skin: Skin is warm Psychiatric: Patient has normal mood and affect  Ortho Exam: Ortho exam demonstrates left shoulder with 30 degrees X rotation, 90 degrees abduction, 160 degrees forward elevation passively and actively.  She has intact  rotator cuff strength of supra, infra, subscap rated 5/5.  She has incisions that are well-healed from prior surgery.  Bicep with appropriate contour with no evidence of Popeye deformity.  No reproduction of pain with supination or bicep flexion testing.  She has excellent strength with both of these movements.  No coarseness or grinding noted with passive motion of the shoulder.  2+ radial pulse of the left upper extremity.  Intact EPL, FPL, finger reduction.  Specialty Comments:  No specialty comments available.  Imaging: No results found.   PMFS History: Patient Active Problem List   Diagnosis Date Noted   Synovitis of left  shoulder 11/07/2022   Degenerative superior labral anterior-to-posterior (SLAP) tear of left shoulder 11/07/2022   Arthritis of left acromioclavicular joint 11/07/2022   Biceps tendinitis of left upper extremity 11/07/2022   Aortic atherosclerosis (HCC) 08/14/2022   Status post left knee replacement 06/01/2021   Status post total left knee replacement 05/31/2021   Presence of left artificial knee joint 05/31/2021   Unilateral primary osteoarthritis, left knee 12/21/2020   S/P gastric bypass 08/01/2018   Morbid obesity with body mass index (BMI) of 45.0 to 49.9 in adult [facility]) 07/06/2018   Type 2 diabetes mellitus without complication, without long-term current use of insulin (HCC) 05/22/2017   Class 3 obesity with serious comorbidity and body mass index (BMI) of 40.0 to 44.9 in adult 05/21/2017   Family history of colon cancer 04/01/2017   Irritable bowel syndrome with diarrhea 04/01/2017   NSAID long-term use 04/01/2017   Lymphocytosis 02/24/2017   Essential hypertension 02/12/2017   Mild intermittent asthma with acute exacerbation 12/12/2016   Left shoulder pain 06/02/2015   Insomnia 05/12/2014   Fibromyalgia 05/12/2014   Allergic rhinitis 03/28/2014   Osteoarthritis 12/20/2013   Primary osteoarthritis of both knees 12/20/2013   Cervical strain 06/21/2013   Low back pain 06/21/2013   Metatarsalgia of both feet 03/11/2013   Sciatica 01/25/2013   Restless leg syndrome 11/24/2012   Pure hypercholesterolemia 10/20/2012   OSA on CPAP 10/20/2012   Depression 10/20/2012   Iron deficiency anemia, unspecified 10/01/2012   Snoring 10/01/2012   Diabetes mellitus, type 2 (HCC) 12/31/2010   ANAL FISSURE 12/31/2010   Morbid obesity (HCC) 01/08/2007   Constipation 01/08/2007   Past Medical History:  Diagnosis Date   Allergic rhinitis, cause unspecified    Anemia    Arthritis    Asthma    Back pain    Chest pain    Constipation    CTS (carpal tunnel syndrome)    Cystocele     Depression    Diabetes mellitus    Dysfunction of eustachian tube    Essential hypertension 02/12/2017   Family history of adverse reaction to anesthesia    daughter hard to wake up   Fatty liver    Fibromyalgia    Fissure, anal    Gallbladder problem    Genital herpes    GERD (gastroesophageal reflux disease)    Headache    Hemorrhoid    Hx of migraine headaches    Hyperlipidemia    IBS (irritable bowel syndrome)    Insomnia    Iron deficiency anemia, unspecified    Joint pain    Nausea    Obesity    OSA on CPAP    Osteoarthritis    Rectocele    Sleep apnea    lost weight, does not use CPAP   TMJ (dislocation of temporomandibular joint)     Family History  Problem Relation Age of Onset   Hypertension Mother 26   Osteoarthritis Mother    Diabetes Mother    Colon cancer Mother 36   Cancer Mother 8       colon cancer   Hyperlipidemia Mother    Obesity Mother    Sudden death Mother    Arthritis Mother    Colon polyps Brother        x 2 brother   Cirrhosis Brother        alcoholism   Alcohol abuse Brother    Sleep apnea Brother    Hyperlipidemia Brother    Migraines Brother    Obstructive Sleep Apnea Brother    Colon cancer Paternal Aunt    Stroke Maternal Grandmother    Colon cancer Paternal Grandmother    Cancer Paternal Grandmother        colon cancer   Diabetes Daughter    Hypertension Daughter    Sleep apnea Daughter    Mental illness Daughter        depression   Migraines Daughter    Migraines Daughter    Mental illness Daughter        anxiety attacks   Protein S deficiency Daughter    Esophageal cancer Neg Hx    Stomach cancer Neg Hx    Pancreatic cancer Neg Hx     Past Surgical History:  Procedure Laterality Date   ABDOMINAL HYSTERECTOMY  11/11/2010   Senaida Ores.  Ovaries intact.  Uterine fibroids with DUB.   BICEPT TENODESIS Left 10/29/2022   Procedure: BICEPS TENODESIS;  Surgeon: Cammy Copa, MD;  Location: [facility] OR;  Service:  Orthopedics;  Laterality: Left;   CARPAL TUNNEL RELEASE  11/12/1987   Left   CHOLECYSTECTOMY     CHOLECYSTECTOMY     COLONOSCOPY  06/29/2012   normal.  Repeat in 5 years.   DILATION AND CURETTAGE OF UTERUS     ECTOPIC PREGNANCY SURGERY  11/12/1991   ESOPHAGOGASTRODUODENOSCOPY  12/13/2011   dysphagia.  Marina Goodell.  Normal.   GASTRIC ROUX-EN-Y N/A 07/06/2018   Procedure: LAPAROSCOPIC ROUX-EN-Y GASTRIC BYPASS WITH UPPER ENDOSCOPY;  Surgeon: Glenna Fellows, MD;  Location: [facility] ORS;  Service: General;  Laterality: N/A;   SHOULDER ARTHROSCOPY WITH OPEN ROTATOR CUFF REPAIR AND DISTAL CLAVICLE ACROMINECTOMY Left 10/29/2022   Procedure: LEFT SHOULDER ACROMIOCLAVICULAR RESECTION, DEBRIDEMENT;  Surgeon: Cammy Copa, MD;  Location: [facility] OR;  Service: Orthopedics;  Laterality: Left;   Sleep study  09/11/2012   severe OSA.  CPAP titration at 12 cm water pressure.   TOTAL KNEE ARTHROPLASTY Left 05/31/2021   Procedure: LEFT TOTAL KNEE ARTHROPLASTY;  Surgeon: Kathryne Hitch, MD;  Location: [facility] OR;  Service: Orthopedics;  Laterality: Left;   TUBAL LIGATION  11/11/1986   Social History   Occupational History   Occupation: Electrical engineer: [employer]    Comment: cma/[employer]  Tobacco Use   Smoking status: Former    Types: Cigarettes   Smokeless tobacco: Never  Vaping Use   Vaping status: Never Used  Substance and Sexual Activity   Alcohol use: No    Alcohol/week: 1.0 standard drink of alcohol    Types: 1 Glasses of wine per week   Drug use: No   Sexual activity: Not Currently    Birth control/protection: None

## 2024-01-30 ENCOUNTER — Encounter (HOSPITAL_COMMUNITY): Payer: Self-pay | Admitting: *Deleted

## 2024-02-02 ENCOUNTER — Other Ambulatory Visit (HOSPITAL_COMMUNITY): Payer: Self-pay

## 2024-02-02 MED ORDER — CLOBETASOL PROPIONATE 0.05 % EX CREA
1.0000 | TOPICAL_CREAM | Freq: Two times a day (BID) | CUTANEOUS | 0 refills | Status: AC
Start: 2024-01-31 — End: 2024-02-17
  Filled 2024-02-02: qty 15, 15d supply, fill #0

## 2024-02-03 ENCOUNTER — Other Ambulatory Visit (HOSPITAL_COMMUNITY): Payer: Self-pay

## 2024-02-13 ENCOUNTER — Ambulatory Visit: Admitting: Surgical

## 2024-02-13 ENCOUNTER — Other Ambulatory Visit: Payer: Self-pay

## 2024-02-13 DIAGNOSIS — M19012 Primary osteoarthritis, left shoulder: Secondary | ICD-10-CM | POA: Diagnosis not present

## 2024-02-13 DIAGNOSIS — M79602 Pain in left arm: Secondary | ICD-10-CM

## 2024-02-18 ENCOUNTER — Encounter: Payer: Self-pay | Admitting: Surgical

## 2024-02-18 MED ORDER — BUPIVACAINE HCL 0.25 % IJ SOLN
9.0000 mL | INTRAMUSCULAR | Status: AC | PRN
  Administered 2024-02-13: 9 mL via INTRA_ARTICULAR

## 2024-02-18 MED ORDER — METHYLPREDNISOLONE ACETATE 40 MG/ML IJ SUSP
40.0000 mg | INTRAMUSCULAR | Status: AC | PRN
  Administered 2024-02-13: 40 mg via INTRA_ARTICULAR

## 2024-02-18 MED ORDER — LIDOCAINE HCL 1 % IJ SOLN
5.0000 mL | INTRAMUSCULAR | Status: AC | PRN
  Administered 2024-02-13: 5 mL

## 2024-02-18 NOTE — Progress Notes (Signed)
   Procedure Note  Patient: Cristina [name]             Date of Birth: 09/09/1961           MRN: [id number]             Visit Date: 02/13/2024  Procedures: Visit Diagnoses:  1. Arthritis of left glenohumeral joint   2. Left arm pain     Large Joint Inj: L glenohumeral on 02/13/2024 11:48 AM Details: 22 G 3.5 in needle, ultrasound-guided posterior approach Medications: 5 mL lidocaine 1 %; 9 mL bupivacaine 0.25 %; 40 mg methylPREDNISolone acetate 40 MG/ML Outcome: tolerated well, no immediate complications Procedure, treatment alternatives, risks and benefits explained, specific risks discussed. Consent was given by the patient. Patient was prepped and draped in the usual sterile fashion.

## 2024-02-20 ENCOUNTER — Other Ambulatory Visit (HOSPITAL_COMMUNITY): Payer: Self-pay

## 2024-02-20 ENCOUNTER — Other Ambulatory Visit: Payer: Self-pay

## 2024-02-24 ENCOUNTER — Other Ambulatory Visit (HOSPITAL_COMMUNITY): Payer: Self-pay

## 2024-02-26 ENCOUNTER — Other Ambulatory Visit (HOSPITAL_COMMUNITY): Payer: Self-pay

## 2024-02-26 MED ORDER — LIDOCAINE 5 % EX PTCH
1.0000 | MEDICATED_PATCH | CUTANEOUS | 0 refills | Status: DC
  Filled 2024-02-26: qty 30, 30d supply, fill #0

## 2024-02-26 NOTE — Telephone Encounter (Signed)
 Okay for this.

## 2024-03-04 ENCOUNTER — Telehealth: Payer: Self-pay | Admitting: Pharmacy Technician

## 2024-03-04 ENCOUNTER — Other Ambulatory Visit (HOSPITAL_COMMUNITY): Payer: Self-pay

## 2024-03-04 NOTE — Telephone Encounter (Addendum)
 Key:[id number] Cristina Lank pa was good until 03/16/24

## 2024-03-04 NOTE — Telephone Encounter (Signed)
 We got notification to do a prior authorization but it wouldn't let me saying:    I set me a note to try 03/16/24

## 2024-03-10 ENCOUNTER — Ambulatory Visit: Payer: Commercial Managed Care - PPO | Admitting: Orthopedic Surgery

## 2024-03-16 ENCOUNTER — Telehealth: Payer: Self-pay | Admitting: Pharmacy Technician

## 2024-03-16 NOTE — Telephone Encounter (Signed)
 Received notification from CoverMyMeds that prior authorization for mounjaro  15mg  is required/requested.   Insurance verification completed.   The patient is insured through [facility] .   Per test claim: PA required; PA submitted to above mentioned insurance via CoverMyMeds Key/confirmation #/EOC [id number] Status is pending

## 2024-03-16 NOTE — Telephone Encounter (Signed)
 Says Member has an active PA on file which is expiring on 03/16/2024 and has 998 no. of fills remaining.  Trying again tomorrow 03/17/24

## 2024-03-17 ENCOUNTER — Ambulatory Visit (INDEPENDENT_AMBULATORY_CARE_PROVIDER_SITE_OTHER): Admitting: Orthopedic Surgery

## 2024-03-17 ENCOUNTER — Other Ambulatory Visit (INDEPENDENT_AMBULATORY_CARE_PROVIDER_SITE_OTHER): Payer: Self-pay

## 2024-03-17 ENCOUNTER — Other Ambulatory Visit (HOSPITAL_COMMUNITY): Payer: Self-pay

## 2024-03-17 DIAGNOSIS — M5416 Radiculopathy, lumbar region: Secondary | ICD-10-CM | POA: Diagnosis not present

## 2024-03-17 MED ORDER — LIDOCAINE 5 % EX PTCH
1.0000 | MEDICATED_PATCH | CUTANEOUS | 2 refills | Status: DC
  Filled 2024-03-17: qty 30, 30d supply, fill #0

## 2024-03-17 NOTE — Telephone Encounter (Signed)
 Still saying:  Pt just got in feb. Waiting until next fill to see if needs pa

## 2024-03-17 NOTE — Progress Notes (Signed)
 Orthopedic Spine Surgery Office Note   Assessment: Patient is a 63 y.o. female with low back pain that radiates into her bilateral lateral hips (left >right).  Has L4/5 spondylolisthesis.  Suspect radiculopathy     Plan: -Patient has tried activity modification, facet injections, Tylenol , PT, core strengthening, gabapentin , lyrica  -She currently has good and bad days and is not interested in any kind of surgical treatment at this time -Has found lidocaine  patches helpful in the past so more were prescribed for her today -If she does want to do surgery or injections in the future, would want get an MRI to evaluate for radiculopathy -Patient should return to office in 6 months, x-rays at next visit: AP/lateral/flex/ex lumbar     Patient expressed understanding of the plan and all questions were answered to the patient's satisfaction.    ___________________________________________________________________________   History: Patient is a 63 y.o. female who has been previously seen in the office for low back pain that radiates into the bilateral lateral hips. She is currently having good and bad days. Her pain on the bad days is similar to how it has been on the past. She has pain in her low back that radiates into her bilateral hips. She has not noticed pain radiating any further. She has not noticed any new symptoms since she was last seen.    Previous treatments: activity modification, facet injections, Tylenol , PT, core strengthening, gabapentin , lyrica      Physical Exam:   General: no acute distress, appears stated age Neurologic: alert, answering questions appropriately, following commands Respiratory: unlabored breathing on room air, symmetric chest rise Psychiatric: appropriate affect, normal cadence to speech     MSK (spine):   -Strength exam                                                   Left                  Right EHL                              5/5                   5/5 TA                                 5/5                  5/5 GSC                             5/5                  5/5 Knee extension            5/5                  5/5 Hip flexion                    5/5                  5/5   -Sensory exam  Sensation intact to light touch in L3-S1 nerve distributions of bilateral lower extremities   -Achilles DTR: 0/4 on the left, 1/4 on the right -Patellar tendon DTR: 1/4 on the left, 1/4 on the right   -Straight leg raise: negative bilaterally  -Clonus: no beats bilaterally   Imaging: XRs of the lumbar spine from 03/17/2024 were independently reviewed and interpreted, showing disc height loss and spondylolisthesis at L4/5. The spondylolisthesis shifts about 2 mm between flexion and extension views. No other significant degenerative changes seen. No fracture or dislocation seen.      Patient name: Cristina [name] Patient MRN: [id number] Date of visit: 03/17/24

## 2024-03-18 ENCOUNTER — Other Ambulatory Visit: Payer: Self-pay | Admitting: Surgical

## 2024-03-18 ENCOUNTER — Other Ambulatory Visit (HOSPITAL_COMMUNITY): Payer: Self-pay

## 2024-03-18 MED ORDER — LIDOCAINE 5 % EX PTCH
1.0000 | MEDICATED_PATCH | CUTANEOUS | 2 refills | Status: AC
  Filled 2024-03-18 – 2024-03-22 (×3): qty 30, 30d supply, fill #0
  Filled 2024-04-29: qty 60, 60d supply, fill #1

## 2024-03-18 NOTE — Telephone Encounter (Signed)
 refilled

## 2024-03-19 ENCOUNTER — Other Ambulatory Visit (HOSPITAL_COMMUNITY): Payer: Self-pay

## 2024-03-22 ENCOUNTER — Other Ambulatory Visit: Payer: Self-pay

## 2024-03-30 ENCOUNTER — Other Ambulatory Visit (HOSPITAL_COMMUNITY): Payer: Self-pay

## 2024-03-31 ENCOUNTER — Other Ambulatory Visit (HOSPITAL_COMMUNITY): Payer: Self-pay

## 2024-04-01 ENCOUNTER — Other Ambulatory Visit (HOSPITAL_COMMUNITY): Payer: Self-pay

## 2024-04-01 ENCOUNTER — Other Ambulatory Visit: Payer: Self-pay | Admitting: Pharmacist

## 2024-04-01 DIAGNOSIS — E1169 Type 2 diabetes mellitus with other specified complication: Secondary | ICD-10-CM

## 2024-04-01 MED ORDER — TIRZEPATIDE 15 MG/0.5ML ~~LOC~~ SOAJ
15.0000 mg | SUBCUTANEOUS | 11 refills | Status: AC
  Filled 2024-04-01: qty 2, 28d supply, fill #0
  Filled 2024-05-23: qty 6, 84d supply, fill #0
  Filled 2024-08-14: qty 6, 84d supply, fill #1
  Filled 2024-11-05: qty 6, 84d supply, fill #2

## 2024-04-06 ENCOUNTER — Other Ambulatory Visit: Payer: Self-pay

## 2024-04-12 ENCOUNTER — Other Ambulatory Visit (HOSPITAL_COMMUNITY): Payer: Self-pay

## 2024-04-13 ENCOUNTER — Other Ambulatory Visit: Payer: Self-pay

## 2024-04-15 ENCOUNTER — Other Ambulatory Visit (HOSPITAL_COMMUNITY): Payer: Self-pay

## 2024-04-29 ENCOUNTER — Other Ambulatory Visit (HOSPITAL_COMMUNITY): Payer: Self-pay

## 2024-05-03 ENCOUNTER — Other Ambulatory Visit (HOSPITAL_COMMUNITY): Payer: Self-pay

## 2024-05-04 ENCOUNTER — Other Ambulatory Visit (HOSPITAL_COMMUNITY): Payer: Self-pay

## 2024-05-05 ENCOUNTER — Other Ambulatory Visit (HOSPITAL_COMMUNITY): Payer: Self-pay

## 2024-05-23 ENCOUNTER — Other Ambulatory Visit (HOSPITAL_COMMUNITY): Payer: Self-pay

## 2024-05-24 ENCOUNTER — Other Ambulatory Visit (HOSPITAL_COMMUNITY): Payer: Self-pay

## 2024-06-07 ENCOUNTER — Other Ambulatory Visit (HOSPITAL_COMMUNITY): Payer: Self-pay

## 2024-06-07 ENCOUNTER — Other Ambulatory Visit: Payer: Self-pay | Admitting: Pharmacist Clinician (PhC)/ Clinical Pharmacy Specialist

## 2024-06-07 DIAGNOSIS — E7800 Pure hypercholesterolemia, unspecified: Secondary | ICD-10-CM

## 2024-06-07 DIAGNOSIS — I700 Atherosclerosis of aorta: Secondary | ICD-10-CM

## 2024-06-07 MED ORDER — REPATHA SURECLICK 140 MG/ML ~~LOC~~ SOAJ
1.0000 mL | SUBCUTANEOUS | 3 refills | Status: AC
  Filled 2024-06-07: qty 6, 84d supply, fill #0
  Filled 2024-08-19: qty 6, 84d supply, fill #1
  Filled 2024-10-27: qty 6, 84d supply, fill #2
  Filled ????-??-??: fill #2

## 2024-06-08 ENCOUNTER — Other Ambulatory Visit: Payer: Self-pay | Admitting: Family Medicine

## 2024-06-08 DIAGNOSIS — Z1231 Encounter for screening mammogram for malignant neoplasm of breast: Secondary | ICD-10-CM

## 2024-06-28 ENCOUNTER — Ambulatory Visit

## 2024-06-30 ENCOUNTER — Other Ambulatory Visit: Payer: Self-pay | Admitting: Medical Genetics

## 2024-07-16 ENCOUNTER — Other Ambulatory Visit (HOSPITAL_BASED_OUTPATIENT_CLINIC_OR_DEPARTMENT_OTHER): Payer: Self-pay | Admitting: Family

## 2024-07-16 ENCOUNTER — Encounter (HOSPITAL_BASED_OUTPATIENT_CLINIC_OR_DEPARTMENT_OTHER): Payer: Self-pay

## 2024-07-16 DIAGNOSIS — R399 Unspecified symptoms and signs involving the genitourinary system: Secondary | ICD-10-CM

## 2024-07-16 LAB — URINALYSIS
Bilirubin, UA: NEGATIVE
Glucose, UA: NEGATIVE
Ketones, UA: NEGATIVE
Nitrite, UA: POSITIVE — AB
Protein,UA: NEGATIVE
RBC, UA: NEGATIVE
Specific Gravity, UA: 1.012 (ref 1.005–1.030)
Urobilinogen, Ur: 1 mg/dL (ref 0.2–1.0)
pH, UA: 6 (ref 5.0–7.5)

## 2024-07-16 NOTE — Progress Notes (Signed)
 Experiencing symptoms of UTI. UA and culture ordered. Patient made aware via MyChart.   Cristina [name] S Amrie Gurganus, NP

## 2024-07-17 ENCOUNTER — Ambulatory Visit (HOSPITAL_BASED_OUTPATIENT_CLINIC_OR_DEPARTMENT_OTHER): Payer: Self-pay | Admitting: Family

## 2024-07-17 DIAGNOSIS — N3000 Acute cystitis without hematuria: Secondary | ICD-10-CM

## 2024-07-17 MED ORDER — FLUCONAZOLE 150 MG PO TABS: 150.0000 mg | ORAL_TABLET | Freq: Once | ORAL | 0 refills | Status: AC

## 2024-07-17 MED ORDER — NITROFURANTOIN MONOHYD MACRO 100 MG PO CAPS: 100.0000 mg | ORAL_CAPSULE | Freq: Two times a day (BID) | ORAL | 0 refills | Status: AC

## 2024-07-18 LAB — URINE CULTURE

## 2024-07-19 ENCOUNTER — Ambulatory Visit

## 2024-07-19 DIAGNOSIS — R1032 Left lower quadrant pain: Secondary | ICD-10-CM | POA: Diagnosis not present

## 2024-07-19 DIAGNOSIS — R1012 Left upper quadrant pain: Secondary | ICD-10-CM | POA: Diagnosis not present

## 2024-07-19 NOTE — Telephone Encounter (Signed)
 Provider FYI ONLY    Reason for call: Patient is calling for:  Chief Complaint  Patient presents with  . Urinary Tract Infection     Reports she now has urinary urgency , frequency , right sided mid flank / back pain , that was 1st noted yesterday 07/18/24      . PCP FYI       Reports she works at a [employer] who collected a UA on Friday 07/17/24  Who prescribed Macrobid  100 mg BID on 07/18/24 = x (5) days  That she began taking on 07/17/24 likely at 15:00 = 42 hours ago  For symptoms , urinary urgency and frequency , suprapubic pain that was 1st noted (3) weeks ago     . Dizziness     Dizziness Intermittently present 1st noted this am , denies same during the call to RN Triage         Actions taken during call: [facility] [place]  shared with patient who reports understanding of same  No driving with  c/o dizziness  discussed with patient who reports understanding of same   Routed encounter to Provider Scheduled an Appointment.  Future Appointments     Date/Time Provider Department Center Visit Type   07/19/2024 11:30 AM (Arrive by 11:15 AM) Jyl Heron Haff, MD [facility] [place] [facility] SAME DAY   07/30/2024 8:00 AM (Arrive by 7:45 AM) Claudene Rayfield Lunger, MD [facility] PHYSICAL       Triage: Triage completed, care advice given per protocol. Triage:Call back parameters given and caller advised of 24 hour nurse triage. Instructed to seek immediate medical attention if new symptoms develop, current symptoms worsen or if you become increasingly concerned. Patient verbalized understanding.     Most recent [facility] PCP Appointment noted as  01/23/24 MD Lunger  Dx diabetes   NENA SALMON, RN [facility]agement [facility]  Patient Engagement Center Documentation  Reports dizziness as of this am  Reason for Disposition . [1] Side (flank) or lower back pain AND [2] new onset since starting antibiotics . Patient wants  to be seen  Additional Information . Negative: Shock suspected (e.g., cold/pale/clammy skin, too weak to stand, low BP, rapid pulse) . Negative: Sounds like a life-threatening emergency to the triager . Negative: Urinary tract infection suspected, but not taking antibiotics . Negative: [1] Unable to urinate (or only a few drops) > 4 hours AND     [2] bladder feels very full (e.g., palpable bladder or strong urge to urinate) . Negative: Passing pure blood or large blood clots (i.e., size > a dime)  (Exceptions: flecks, small strands, or pinkish-red color) . Negative: Patient sounds very sick or weak to the triager . Negative: [1] SEVERE pain (e.g., excruciating) AND [2] no improvement 2 hours after pain medications . Negative: [1] Fever > 100.5 F (38.1 C) AND [2] new onset since starting antibiotics . Negative: Shock suspected (e.g., cold/pale/clammy skin, too weak to stand) . Negative: Difficult to awaken or acting confused (e.g., disoriented, slurred speech) . Negative: Fainted, and still feels dizzy afterwards . Negative: SEVERE difficulty breathing (e.g., struggling for each breath, speaks in single words) . Negative: Overdose (accidental or intentional) of medications . Negative: New neurologic deficit that is present now: * Weakness of the face, arm, or leg on one side of the body * Numbness of the face, arm, or leg on one side of the body * Loss of speech or garbled speech . Negative: Heart  beating < 50 beats per minute OR > 140 beats per minute . Negative: Sounds like a life-threatening emergency to the triager . Negative: Chest pain . Negative: Rectal bleeding, bloody stool, or tarry-black stool . Negative: Vomiting is the main symptom . Negative: Diarrhea is the main symptom . Negative: Headache is the main symptom . Negative: Earache is the main symptom . Negative: SEVERE dizziness (e.g., unable to stand, requires support to walk, feels like passing out now) . Negative: Severe  headache . Negative: Extra heart beats OR irregular heart beating (i.e., 'palpitations') . Negative: Difficulty breathing . Negative: Drinking very little and has signs of dehydration (e.g., no urine > 12 hours, very dry mouth, very lightheaded) . Negative: Follows bleeding (e.g., stomach, rectum, vagina) (Exception: became dizzy from sight of small amount blood) . Negative: Patient sounds very sick or weak to the triager . Negative: Lightheadedness (dizziness) present now, after 2 hours of rest and fluids . Negative: Spinning or tilting sensation (vertigo) present now . Negative: Fever > 103 F (39.4 C) . Negative: Fever > 100.5 F (38.1 C) and has diabetes mellitus or a weak immune system (e.g., HIV positive, cancer chemotherapy, organ transplant, splenectomy, chronic steroids) . Negative: Vomiting occurs with dizziness  Answer Assessment - Initial Assessment Questions 1. ANTIBIOTIC: What antibiotic are you taking? How many times per day?  2. DURATION: When was the antibiotic started?  3. MAIN SYMPTOM: What is the main symptom you are concerned about?  4. FEVER: Do you have a fever? If so, ask: What is it, how was it measured, and when did it start?  5. OTHER SYMPTOMS: Do you have any other symptoms? (e.g., flank pain, vaginal discharge, blood in urine) flank pain  Protocols used: Urinary Tract Infection on Antibiotic Follow-up Call - Female-A-AH, Dizziness-A-OH

## 2024-07-20 ENCOUNTER — Encounter (HOSPITAL_BASED_OUTPATIENT_CLINIC_OR_DEPARTMENT_OTHER): Payer: Self-pay | Admitting: Emergency Medicine

## 2024-07-20 ENCOUNTER — Emergency Department (HOSPITAL_BASED_OUTPATIENT_CLINIC_OR_DEPARTMENT_OTHER)

## 2024-07-20 ENCOUNTER — Other Ambulatory Visit (HOSPITAL_COMMUNITY): Payer: Self-pay

## 2024-07-20 ENCOUNTER — Other Ambulatory Visit: Payer: Self-pay

## 2024-07-20 ENCOUNTER — Other Ambulatory Visit (HOSPITAL_BASED_OUTPATIENT_CLINIC_OR_DEPARTMENT_OTHER): Payer: Self-pay

## 2024-07-20 ENCOUNTER — Emergency Department (HOSPITAL_BASED_OUTPATIENT_CLINIC_OR_DEPARTMENT_OTHER)
Admission: EM | Admit: 2024-07-20 | Discharge: 2024-07-20 | Disposition: A | Discharge status: Home / Self Care | Attending: Emergency Medicine | Admitting: Emergency Medicine

## 2024-07-20 DIAGNOSIS — Z90710 Acquired absence of both cervix and uterus: Secondary | ICD-10-CM | POA: Diagnosis not present

## 2024-07-20 DIAGNOSIS — R300 Dysuria: Secondary | ICD-10-CM | POA: Diagnosis not present

## 2024-07-20 DIAGNOSIS — R1032 Left lower quadrant pain: Secondary | ICD-10-CM | POA: Insufficient documentation

## 2024-07-20 DIAGNOSIS — R110 Nausea: Secondary | ICD-10-CM | POA: Insufficient documentation

## 2024-07-20 DIAGNOSIS — R1030 Lower abdominal pain, unspecified: Secondary | ICD-10-CM

## 2024-07-20 DIAGNOSIS — E119 Type 2 diabetes mellitus without complications: Secondary | ICD-10-CM | POA: Diagnosis not present

## 2024-07-20 DIAGNOSIS — R1031 Right lower quadrant pain: Secondary | ICD-10-CM | POA: Diagnosis not present

## 2024-07-20 DIAGNOSIS — Z7984 Long term (current) use of oral hypoglycemic drugs: Secondary | ICD-10-CM | POA: Insufficient documentation

## 2024-07-20 DIAGNOSIS — Z79899 Other long term (current) drug therapy: Secondary | ICD-10-CM | POA: Insufficient documentation

## 2024-07-20 DIAGNOSIS — Z9049 Acquired absence of other specified parts of digestive tract: Secondary | ICD-10-CM | POA: Diagnosis not present

## 2024-07-20 DIAGNOSIS — I10 Essential (primary) hypertension: Secondary | ICD-10-CM | POA: Diagnosis not present

## 2024-07-20 LAB — CBC WITH DIFFERENTIAL/PLATELET
Abs Immature Granulocytes: 0.03 K/uL (ref 0.00–0.07)
Basophils Absolute: 0.1 K/uL (ref 0.0–0.1)
Basophils Relative: 1 %
Eosinophils Absolute: 0.2 K/uL (ref 0.0–0.5)
Eosinophils Relative: 2 %
HCT: 40.5 % (ref 36.0–46.0)
Hemoglobin: 13.3 g/dL (ref 12.0–15.0)
Immature Granulocytes: 0 %
Lymphocytes Relative: 48 %
Lymphs Abs: 3.8 K/uL (ref 0.7–4.0)
MCH: 29.3 pg (ref 26.0–34.0)
MCHC: 32.8 g/dL (ref 30.0–36.0)
MCV: 89.2 fL (ref 80.0–100.0)
Monocytes Absolute: 0.6 K/uL (ref 0.1–1.0)
Monocytes Relative: 7 %
Neutro Abs: 3.4 K/uL (ref 1.7–7.7)
Neutrophils Relative %: 42 %
Platelets: 296 K/uL (ref 150–400)
RBC: 4.54 MIL/uL (ref 3.87–5.11)
RDW: 13.4 % (ref 11.5–15.5)
WBC: 8.1 K/uL (ref 4.0–10.5)
nRBC: 0 % (ref 0.0–0.2)

## 2024-07-20 LAB — COMPREHENSIVE METABOLIC PANEL WITH GFR
ALT: 39 U/L (ref 0–44)
AST: 33 U/L (ref 15–41)
Albumin: 4.1 g/dL (ref 3.5–5.0)
Alkaline Phosphatase: 92 U/L (ref 38–126)
Anion gap: 11 (ref 5–15)
BUN: 16 mg/dL (ref 8–23)
CO2: 23 mmol/L (ref 22–32)
Calcium: 9.4 mg/dL (ref 8.9–10.3)
Chloride: 105 mmol/L (ref 98–111)
Creatinine, Ser: 0.81 mg/dL (ref 0.44–1.00)
GFR, Estimated: >60 mL/min (ref >60)
Glucose, Bld: 90 mg/dL (ref 70–99)
Potassium: 3.9 mmol/L (ref 3.5–5.1)
Sodium: 140 mmol/L (ref 135–145)
Total Bilirubin: 0.8 mg/dL (ref 0.0–1.2)
Total Protein: 7.3 g/dL (ref 6.5–8.1)

## 2024-07-20 LAB — URINALYSIS, W/ REFLEX TO CULTURE (INFECTION SUSPECTED)
Bilirubin Urine: NEGATIVE
Glucose, UA: NEGATIVE mg/dL
Hgb urine dipstick: NEGATIVE
Ketones, ur: NEGATIVE mg/dL
Leukocytes,Ua: NEGATIVE
Nitrite: NEGATIVE
Protein, ur: NEGATIVE mg/dL
Specific Gravity, Urine: 1.011 (ref 1.005–1.030)
pH: 5.5 (ref 5.0–8.0)

## 2024-07-20 LAB — LIPASE, BLOOD: Lipase: 68 U/L — ABNORMAL HIGH (ref 11–51)

## 2024-07-20 MED ORDER — IOHEXOL 300 MG/ML  SOLN
100.0000 mL | Freq: Once | INTRAMUSCULAR | Status: AC | PRN
  Administered 2024-07-20: 100 mL via INTRAVENOUS

## 2024-07-20 MED ORDER — DICYCLOMINE HCL 20 MG PO TABS
20.0000 mg | ORAL_TABLET | Freq: Two times a day (BID) | ORAL | 0 refills | Status: AC
  Filled 2024-07-20 (×2): qty 20, 10d supply, fill #0

## 2024-07-20 MED ORDER — OXYCODONE-ACETAMINOPHEN 5-325 MG PO TABS: 1.0000 | ORAL_TABLET | Freq: Once | ORAL | Status: DC

## 2024-07-20 MED ORDER — FENTANYL CITRATE PF 50 MCG/ML IJ SOSY
50.0000 ug | PREFILLED_SYRINGE | Freq: Once | INTRAMUSCULAR | Status: AC
  Administered 2024-07-20: 50 ug via INTRAVENOUS
  Filled 2024-07-20: qty 1

## 2024-07-20 MED ORDER — ONDANSETRON HCL 4 MG/2ML IJ SOLN
4.0000 mg | Freq: Once | INTRAMUSCULAR | Status: AC
Start: 2024-07-20 — End: 2024-07-20
  Administered 2024-07-20: 4 mg via INTRAVENOUS
  Filled 2024-07-20: qty 2

## 2024-07-20 MED ORDER — SODIUM CHLORIDE 0.9 % IV BOLUS
1000.0000 mL | Freq: Once | INTRAVENOUS | Status: AC
  Administered 2024-07-20: 1000 mL via INTRAVENOUS

## 2024-07-20 NOTE — ED Provider Notes (Signed)
 [place] EMERGENCY DEPARTMENT AT [facility] Provider Note   CSN: [id number] Arrival date & time: 07/20/24  0710     Patient presents with: Abdominal Pain   Cristina [name] is a 63 y.o. female.    Abdominal Pain Associated symptoms: dysuria and nausea      63 year old female with medical history significant for diabetes mellitus, obesity, GERD, HLD, HTN, fibromyalgia, rectocele, cystocele, constipation presenting to the emergency department with abdominal pain.  The patient states that she was recently diagnosed with a UTI and has been taking Macrobid .  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  She continues to endorse bilateral lower abdominal pain, radiation to the left lower quadrant, endorses nausea, denies any fevers or chills.  She denies any vomiting.  Her last bowel movement was yesterday and was normal, states that she is passing gas.  She denies any vaginal bleeding or discharge.  She has had some flank pain as well.  She was advised to present to the emergency department for CT imaging due to ongoing symptoms.  Prior to Admission medications   Medication Sig Start Date End Date Taking? Authorizing Provider  dicyclomine  (BENTYL ) 20 MG tablet Take 1 tablet (20 mg total) by mouth 2 (two) times daily. 07/20/24  Yes Jerrol Agent, MD  acyclovir  (ZOVIRAX ) 400 MG tablet Take 1 tablet (400 mg total) by mouth 2 (two) times daily. 04/01/23     acyclovir  (ZOVIRAX ) 400 MG tablet Take 1 tablet (400 mg total) by mouth 2 (two) times daily. 01/23/24     albuterol  (VENTOLIN  HFA) 108 (90 Base) MCG/ACT inhaler Inhale 2 puffs into the lungs every 6 (six) hours as needed for wheezing 03/05/22     ANUCORT-HC  25 MG suppository PLACE 1 SUPPOSITORY RECTALLY 2 TIMES DAILY AS NEEDED FOR HEMORRHOIDS. 05/21/17   Claudene Rayfield CHRISTELLA, MD  azelastine  (ASTELIN ) 0.1 % nasal spray PLACE 2 SPRAYS INTO BOTH NOSTRILS 2 TIMES DAILY AS DIRECTED Patient taking differently: Place 2 sprays into both nostrils daily as needed for allergies or rhinitis. 05/21/17   Smith, Kristi M, MD  azelastine  (OPTIVAR ) 0.05 % ophthalmic solution Place 1 drop into both eyes 2 (two) times daily as needed. 03/05/22     Blood Glucose Monitoring Suppl (FREESTYLE LITE) w/Device KIT use to test blood sugar once daily 05/03/22     buPROPion  (WELLBUTRIN ) 100 MG tablet Take 1 tablet (100 mg total) by mouth 2 (two) times daily 09/18/23     calcium  carbonate (OS-CAL) 1250 (500 Ca) MG chewable tablet Chew 1 tablet by mouth daily.    [provider]  cetirizine  (ZYRTEC ) 10 MG tablet Take 1 tablet (10 mg total) by mouth once daily 08/29/22     diltiazem  2 % GEL Apply 1 application topically 3 (three) times daily. Patient taking differently: Apply 1 application  topically 3 (three) times daily as needed (anal fissure). 04/01/17   Zehr, Jessica D, PA-C  escitalopram  (LEXAPRO ) 20 MG tablet Take 1 tablet (20 mg total) by mouth daily. 09/19/23     esomeprazole  (NEXIUM ) 40 MG capsule Take 1 capsule (40 mg total) by mouth daily. 09/19/23     Evolocumab  (REPATHA  SURECLICK) 140 MG/ML SOAJ Inject 1 mL into the skin every 14 (fourteen) days. 06/07/24   [name] Reche RAMAN, NP  ezetimibe  (ZETIA ) 10 MG  tablet Take 1 tablet (10 mg total) by mouth daily. 07/23/23 07/27/24  Lutgen, Megan E, RPH-CPP  fluticasone  (FLONASE ) 50 MCG/ACT nasal spray Place 2 sprays into both nostrils daily. 12/04/23     gabapentin  (NEURONTIN ) 300 MG capsule Take 2 capsules (600 mg total) by mouth at bedtime. 03/31/23     gabapentin  (NEURONTIN ) 300 MG capsule Take 2 capsules (600 mg total) by mouth at bedtime. 01/23/24     glucose blood (FREESTYLE LITE) test strip Use to test blood sugar once daily. 01/12/24     glucose blood test strip Use as directed 05/01/22   Hobart Powell BRAVO, MD  hydrocortisone  (ANUSOL -HC)  25 MG suppository Place 1 suppository (25 mg total) rectally 2 (two) times daily as needed for Hemorrhoids 01/23/24     hydrocortisone  2.5 % cream Apply 1 Application topically 2 (two) times daily. 09/23/23     ipratropium (ATROVENT ) 0.06 % nasal spray Place 2 sprays into both nostrils 4 (four) times daily as needed for runny nose and/or post nasal drip. 11/17/23     ipratropium (ATROVENT ) 0.06 % nasal spray Place 2 sprays into both nostrils 2 (two) times daily 01/23/24     Lancets (FREESTYLE) lancets use 1 lancet to test blood sugar once daily 05/03/22     lidocaine  (LIDODERM ) 5 % Place 1 patch onto the skin daily. Remove & Discard patch within 12 hours 03/18/24   Magnant, Charles L, PA-C  methocarbamol  (ROBAXIN ) 500 MG tablet Take 1 tablet (500 mg total) by mouth every 8 (eight) hours as needed for muscle spasms. 10/29/22   Magnant, Carlin CROME, PA-C  Multiple Vitamins-Minerals (MULTIVITAMIN WITH MINERALS) tablet Take 1 tablet by mouth daily.    [provider]  nitrofurantoin , macrocrystal-monohydrate, (MACROBID ) 100 MG capsule Take 1 capsule (100 mg total) by mouth 2 (two) times daily for 5 days. 07/17/24 07/22/24  [name] Reche RAMAN, NP  nitroGLYCERIN  (NITROSTAT ) 0.4 MG SL tablet Place 1 tablet (0.4 mg total) under the tongue every 5 (five) minutes as needed for Chest pain Maximum of 3 doses in 24 hour period. 08/29/22     pregabalin  (LYRICA ) 50 MG capsule Take 1 capsule (50 mg total) by mouth 3 (three) times daily. 03/22/23   Georgina Ozell LABOR, MD  terconazole  (TERAZOL 7 ) 0.4 % vaginal cream Place 1 applicator vaginally at bedtime. 04/08/23   Hobart Powell BRAVO, MD  tirzepatide  (MOUNJARO ) 15 MG/0.5ML Pen Inject 15 mg into the skin once a week. 04/01/24   Pavero, Lonni, RPH  traMADol  (ULTRAM ) 50 MG tablet Take 1 tablet (50 mg total) by mouth every 8 (eight) hours as needed. 12/11/22   Magnant, Carlin CROME, PA-C  TRUEPLUS LANCETS 30G MISC USE TO CHECK BLOOD GLUCOSE DAILY AS DIRECTED 05/21/17   Claudene Rayfield HERO, MD    Allergies: Atorvastatin  and Fish oil    Review of Systems  Gastrointestinal:  Positive for abdominal pain and nausea.  Genitourinary:  Positive for dysuria, flank pain and frequency.  All other systems reviewed and are negative.   Updated Vital Signs BP 118/77   Pulse 69   Temp 98.2 F (36.8 C) (Oral)   Resp 18   Wt 65.3 kg   LMP 11/11/2010   SpO2 95%   BMI 27.21 kg/m   Physical Exam Vitals and nursing note reviewed.  Constitutional:      General: She is not in acute distress.    Appearance: She is well-developed. She is obese.  HENT:     Head: Normocephalic and atraumatic.  Eyes:     Conjunctiva/sclera: Conjunctivae normal.  Cardiovascular:     Rate and Rhythm: Normal rate and regular rhythm.     Heart sounds: No murmur heard. Pulmonary:     Effort: Pulmonary effort is normal. No respiratory distress.     Breath sounds: Normal breath sounds.  Abdominal:     Palpations: Abdomen is soft.     Tenderness: There is abdominal tenderness in the right lower quadrant, suprapubic area and left lower quadrant. There is no guarding.  Musculoskeletal:        General: No swelling.     Cervical back: Neck supple.  Skin:    General: Skin is warm and dry.     Capillary Refill: Capillary refill takes less than 2 seconds.  Neurological:     Mental Status: She is alert.  Psychiatric:        Mood and Affect: Mood normal.     (all labs ordered are listed, but only abnormal results are displayed) Labs Reviewed  LIPASE, BLOOD - Abnormal; Notable for the following components:      Result Value   Lipase 68 (*)    All other components within normal limits  URINALYSIS, W/ REFLEX TO CULTURE (INFECTION SUSPECTED) - Abnormal; Notable for the following components:   Bacteria, UA RARE (*)    All other components within normal limits  CBC WITH DIFFERENTIAL/PLATELET  COMPREHENSIVE METABOLIC PANEL WITH GFR    EKG: None  Radiology: CT ABDOMEN PELVIS W  CONTRAST Result Date: 07/20/2024 CLINICAL DATA:  Lower abdominal pain x3. EXAM: CT ABDOMEN AND PELVIS WITH CONTRAST TECHNIQUE: Multidetector CT imaging of the abdomen and pelvis was performed using the standard protocol following bolus administration of intravenous contrast. RADIATION DOSE REDUCTION: This exam was performed according to the departmental dose-optimization program which includes automated exposure control, adjustment of the mA and/or kV according to patient size and/or use of iterative reconstruction technique. CONTRAST:  OMNIPAQUE  IOHEXOL  300 MG/ML  SOLN COMPARISON:  Apr 01, 2023 FINDINGS: Lower chest: No acute abnormality. Hepatobiliary: No focal liver abnormality is seen. Status post cholecystectomy. No biliary dilatation. Pancreas: Unremarkable. No pancreatic ductal dilatation or surrounding inflammatory changes. Spleen: Normal in size without focal abnormality. Adrenals/Urinary Tract: Adrenal glands are unremarkable. Kidneys are normal, without renal calculi, focal lesion, or hydronephrosis. The urinary bladder is poorly distended and is otherwise unremarkable. Stomach/Bowel: Surgical sutures are seen throughout the gastric region. The appendix is poorly visualized. No evidence of bowel wall thickening, distention, or inflammatory changes. Vascular/Lymphatic: Aortic atherosclerosis. No enlarged abdominal or pelvic lymph nodes. Reproductive: Status post hysterectomy. No adnexal masses. Other: No abdominal wall hernia or abnormality. No abdominopelvic ascites. Musculoskeletal: A fracture deformity of indeterminate age is seen involving the superior endplate of the L4 vertebral body. This is not clearly identified on the prior study (sagittal reformatted images 57 through 61, CT series 5). IMPRESSION: 1. Fracture deformity of indeterminate age involving the superior endplate of the L4 vertebral body. MRI correlation is recommended. 2. Evidence of prior cholecystectomy, gastric surgery and  hysterectomy. 3. Aortic atherosclerosis. Electronically Signed   By: Suzen Dials M.D.   On: 07/20/2024 11:28     Procedures   Medications Ordered in the ED  oxyCODONE -acetaminophen  (PERCOCET/ROXICET) 5-325 MG per tablet 1 tablet (has no administration in time range)  sodium chloride  0.9 % bolus 1,000 mL (0 mLs Intravenous Stopped 07/20/24 0852)  fentaNYL  (SUBLIMAZE ) injection 50 mcg (50 mcg Intravenous Given 07/20/24 0750)  ondansetron  (ZOFRAN ) injection 4 mg (4 mg  Intravenous Given 07/20/24 0749)  iohexol  (OMNIPAQUE ) 300 MG/ML solution 100 mL (100 mLs Intravenous Contrast Given 07/20/24 1039)                                    Medical Decision Making Amount and/or Complexity of Data Reviewed Labs: ordered. Radiology: ordered.  Risk Prescription drug management.      63 year old female with medical history significant for diabetes mellitus, obesity, GERD, HLD, HTN, fibromyalgia, rectocele, cystocele, constipation presenting to the emergency department with abdominal pain.  The patient states that she was recently diagnosed with a UTI and has been taking Macrobid .  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  She continues to endorse bilateral lower abdominal pain, radiation to the left lower quadrant, endorses nausea, denies any fevers or chills.  She denies any vomiting.  Her last bowel movement was yesterday and was normal, states that she is passing gas.  She denies any vaginal bleeding or discharge.  She has had some flank pain as well.  She was advised to present to the emergency department for CT imaging due to ongoing symptoms.  On arrival, the patient was afebrile, not tachycardic or tachypneic, hemodynamically stable BP 132/85, saturating 94% on room air.  Patient on exam had primarily suprapubic  tenderness, some left lower quadrant tenderness as well.  Patient endorses 8 out of 10 abdominal pain.  Differential diagnosis includes UTI/developing pyelonephritis, additionally considered diverticulitis, bowel obstruction, constipation, appendicitis, nephrolithiasis, malignancy.  IV access was obtained and the patient was administered IV fentanyl  for pain control, IV Zofran  for nausea and a 1 L NaCl bolus for volume resuscitation.  Labs: CBC without a leukocytosis anemia or platelet abnormality, CMP unremarkable, lipase 68, urinalysis with rare bacteria, otherwise negative with negative nitrites, negative leukocytes, 0-5 WBCs.  CT Abd Pel:  IMPRESSION:  1. Fracture deformity of indeterminate age involving the superior  endplate of the L4 vertebral body. MRI correlation is recommended.  2. Evidence of prior cholecystectomy, gastric surgery and  hysterectomy.  3. Aortic atherosclerosis.    Patient states that the fracture in her spine is known, is not complaining of pain in the spine and is neurologically intact.  Reassuring workup with reassuring laboratory evaluation and CT imaging.  Patient feeling symptomatically improved following the above interventions, advised continued outpatient antibiotic use, follow-up with her PCP, return precautions provided in the event of any severe worsening symptoms.     Final diagnoses:  Lower abdominal pain    ED Discharge Orders          Ordered    dicyclomine  (BENTYL ) 20 MG tablet  2 times daily        07/20/24 1151               Jerrol Agent, MD 07/20/24 1151

## 2024-07-20 NOTE — Discharge Instructions (Addendum)
 Your workup was overall reassuring.  Your CT imaging showed a fracture deformity that is age-indeterminate that you already know about, no other acute findings were seen on laboratory evaluation or CT imaging.   IMPRESSION:  1. Fracture deformity of indeterminate age involving the superior  endplate of the L4 vertebral body. MRI correlation is recommended.  2. Evidence of prior cholecystectomy, gastric surgery and  hysterectomy.  3. Aortic atherosclerosis.   Recommend continued outpatient follow-up, return for any severe worsening symptoms.  Bentyl  has been prescribed for crampy abdominal discomfort.

## 2024-07-20 NOTE — ED Notes (Signed)
 Pt given discharge instructions and reviewed prescriptions. Opportunities given for questions. Pt verbalizes understanding. PIV removed x1. Bethena Powell SAUNDERS, RN

## 2024-07-20 NOTE — ED Triage Notes (Signed)
 Pt c/o lower Abd pain x 3 week, recent dx with UTI. Pain continues

## 2024-07-21 ENCOUNTER — Other Ambulatory Visit (HOSPITAL_BASED_OUTPATIENT_CLINIC_OR_DEPARTMENT_OTHER): Payer: Self-pay

## 2024-07-22 ENCOUNTER — Other Ambulatory Visit (HOSPITAL_COMMUNITY): Payer: Self-pay

## 2024-07-22 ENCOUNTER — Other Ambulatory Visit: Payer: Self-pay | Admitting: Pharmacist

## 2024-07-22 DIAGNOSIS — E7800 Pure hypercholesterolemia, unspecified: Secondary | ICD-10-CM

## 2024-07-22 MED ORDER — EZETIMIBE 10 MG PO TABS
10.0000 mg | ORAL_TABLET | Freq: Every day | ORAL | 3 refills | Status: AC
  Filled 2024-07-22: qty 90, 90d supply, fill #0
  Filled 2024-10-19: qty 90, 90d supply, fill #1

## 2024-07-30 ENCOUNTER — Ambulatory Visit

## 2024-07-30 ENCOUNTER — Other Ambulatory Visit: Payer: Self-pay

## 2024-07-30 ENCOUNTER — Ambulatory Visit
Admission: RE | Admit: 2024-07-30 | Discharge: 2024-07-30 | Disposition: A | Discharge status: Home / Self Care | Source: Ambulatory Visit | Attending: Family Medicine | Admitting: Family Medicine

## 2024-07-30 ENCOUNTER — Other Ambulatory Visit (HOSPITAL_COMMUNITY): Payer: Self-pay

## 2024-07-30 DIAGNOSIS — Z1231 Encounter for screening mammogram for malignant neoplasm of breast: Secondary | ICD-10-CM | POA: Diagnosis not present

## 2024-07-30 DIAGNOSIS — Z0000 Encounter for general adult medical examination without abnormal findings: Secondary | ICD-10-CM | POA: Diagnosis not present

## 2024-07-30 DIAGNOSIS — F331 Major depressive disorder, recurrent, moderate: Secondary | ICD-10-CM | POA: Diagnosis not present

## 2024-07-30 DIAGNOSIS — R3915 Urgency of urination: Secondary | ICD-10-CM | POA: Diagnosis not present

## 2024-07-30 DIAGNOSIS — J301 Allergic rhinitis due to pollen: Secondary | ICD-10-CM | POA: Diagnosis not present

## 2024-07-30 DIAGNOSIS — Z1331 Encounter for screening for depression: Secondary | ICD-10-CM | POA: Diagnosis not present

## 2024-07-30 DIAGNOSIS — G250 Essential tremor: Secondary | ICD-10-CM | POA: Diagnosis not present

## 2024-07-30 DIAGNOSIS — K219 Gastro-esophageal reflux disease without esophagitis: Secondary | ICD-10-CM | POA: Diagnosis not present

## 2024-07-30 DIAGNOSIS — Z1330 Encounter for screening examination for mental health and behavioral disorders, unspecified: Secondary | ICD-10-CM | POA: Diagnosis not present

## 2024-07-30 DIAGNOSIS — E119 Type 2 diabetes mellitus without complications: Secondary | ICD-10-CM | POA: Diagnosis not present

## 2024-07-30 MED ORDER — ESOMEPRAZOLE MAGNESIUM 40 MG PO CPDR
40.0000 mg | DELAYED_RELEASE_CAPSULE | Freq: Every day | ORAL | 4 refills | Status: AC
  Filled 2024-07-30: qty 90, 90d supply, fill #0
  Filled 2024-11-15: qty 90, 90d supply, fill #1

## 2024-07-30 MED ORDER — ESCITALOPRAM OXALATE 20 MG PO TABS
20.0000 mg | ORAL_TABLET | Freq: Every day | ORAL | 3 refills | Status: AC
  Filled 2024-07-30: qty 90, 90d supply, fill #0
  Filled 2024-11-15: qty 90, 90d supply, fill #1

## 2024-07-30 MED ORDER — BUPROPION HCL 100 MG PO TABS
100.0000 mg | ORAL_TABLET | Freq: Two times a day (BID) | ORAL | 4 refills | Status: AC
  Filled 2024-09-14: qty 180, 90d supply, fill #0

## 2024-07-30 MED ORDER — AZELASTINE HCL 0.05 % OP SOLN
1.0000 [drp] | Freq: Two times a day (BID) | OPHTHALMIC | 4 refills | Status: AC | PRN
  Filled 2024-07-30: qty 12, 60d supply, fill #0
  Filled 2024-07-30: qty 6, 30d supply, fill #0

## 2024-08-03 ENCOUNTER — Other Ambulatory Visit

## 2024-08-06 ENCOUNTER — Other Ambulatory Visit (HOSPITAL_COMMUNITY): Payer: Self-pay

## 2024-08-06 MED ORDER — FLUZONE 0.5 ML IM SUSY
0.5000 mL | PREFILLED_SYRINGE | Freq: Once | INTRAMUSCULAR | 0 refills | Status: AC
  Filled 2024-08-06: qty 0.5, 1d supply, fill #0

## 2024-09-13 ENCOUNTER — Encounter: Payer: Self-pay | Admitting: Radiology

## 2024-09-14 ENCOUNTER — Other Ambulatory Visit (HOSPITAL_COMMUNITY): Payer: Self-pay

## 2024-09-15 ENCOUNTER — Other Ambulatory Visit: Payer: Self-pay

## 2024-09-15 ENCOUNTER — Ambulatory Visit: Admitting: Orthopedic Surgery

## 2024-09-15 DIAGNOSIS — M5416 Radiculopathy, lumbar region: Secondary | ICD-10-CM | POA: Diagnosis not present

## 2024-09-15 NOTE — Progress Notes (Signed)
 Orthopedic Spine Surgery Office Note   Assessment: Patient is a 63 y.o. female with low back pain that radiates into her bilateral lateral hips (left >right).  Has L4/5 spondylolisthesis.  Suspect radiculopathy     Plan: -Patient has tried activity modification, facet injections, Tylenol , PT, core strengthening, gabapentin , lyrica , lidocaine  patches -Patient is doing the same as before.  No recent changes.  Wants to continue with nonoperative management -If she does want to do surgery or injections in the future, would want get an MRI to evaluate for radiculopathy -Patient should return to office in 6 months, x-rays at next visit: AP/lateral/flex/ex lumbar     Patient expressed understanding of the plan and all questions were answered to the patient's satisfaction.    ___________________________________________________________________________   History: Patient is a 63 y.o. female who has been previously seen in the office for low back pain that radiates into the bilateral lateral hips.  Patient's pain is unchanged from prior visit.  She says that the pain is manageable on some days and other days it is more significant.  She has not developed any new symptoms since she was last seen in the office.   Previous treatments: activity modification, facet injections, Tylenol , PT, core strengthening, gabapentin , lyrica , lidocaine  patches      Physical Exam:   General: no acute distress, appears stated age Neurologic: alert, answering questions appropriately, following commands Respiratory: unlabored breathing on room air, symmetric chest rise Psychiatric: appropriate affect, normal cadence to speech     MSK (spine):   -Strength exam                                                   Left                  Right EHL                              5/5                  5/5 TA                                 5/5                  5/5 GSC                             5/5                  5/5 Knee  extension            5/5                  5/5 Hip flexion                    5/5                  5/5   -Sensory exam                           Sensation intact to light touch in L3-S1 nerve distributions of bilateral lower extremities  Imaging: XRs of the lumbar spine from 09/15/2024 were independently reviewed and interpreted, showing spondylolisthesis at L4/5. Disc height loss at L4/5. No other significant degenerative changes seen. No fracture or dislocation seen.      Patient name: Cristina [name] Patient MRN: [id number] Date of visit: 09/15/24

## 2024-09-23 ENCOUNTER — Other Ambulatory Visit

## 2024-09-23 DIAGNOSIS — Z006 Encounter for examination for normal comparison and control in clinical research program: Secondary | ICD-10-CM

## 2024-10-11 LAB — GENECONNECT MOLECULAR SCREEN: Genetic Analysis Overall Interpretation: NEGATIVE

## 2024-10-27 ENCOUNTER — Other Ambulatory Visit: Payer: Self-pay

## 2024-10-27 ENCOUNTER — Other Ambulatory Visit (HOSPITAL_COMMUNITY): Payer: Self-pay

## 2024-11-01 ENCOUNTER — Other Ambulatory Visit (HOSPITAL_COMMUNITY): Payer: Self-pay

## 2024-11-12 ENCOUNTER — Other Ambulatory Visit (HOSPITAL_COMMUNITY): Payer: Self-pay

## 2024-11-12 MED ORDER — DOXYCYCLINE HYCLATE 100 MG PO CAPS
100.0000 mg | ORAL_CAPSULE | Freq: Two times a day (BID) | ORAL | 0 refills | Status: AC
  Filled 2024-11-12: qty 14, 7d supply, fill #0

## 2024-11-16 ENCOUNTER — Other Ambulatory Visit (HOSPITAL_COMMUNITY): Payer: Self-pay

## 2025-03-16 ENCOUNTER — Ambulatory Visit: Admitting: Orthopedic Surgery
